# Patient Record
Sex: Female | Born: 1960
Health system: Southern US, Community
[De-identification: ages and names within clinical notes are randomized; demographics above are authoritative.]

## PROBLEM LIST (undated history)

## (undated) DIAGNOSIS — E039 Hypothyroidism, unspecified: Secondary | ICD-10-CM

## (undated) DIAGNOSIS — I739 Peripheral vascular disease, unspecified: Secondary | ICD-10-CM

## (undated) DIAGNOSIS — M51369 Other intervertebral disc degeneration, lumbar region without mention of lumbar back pain or lower extremity pain: Secondary | ICD-10-CM

## (undated) DIAGNOSIS — D509 Iron deficiency anemia, unspecified: Secondary | ICD-10-CM

## (undated) DIAGNOSIS — R195 Other fecal abnormalities: Secondary | ICD-10-CM

## (undated) DIAGNOSIS — J449 Chronic obstructive pulmonary disease, unspecified: Secondary | ICD-10-CM

## (undated) DIAGNOSIS — F32A Depression, unspecified: Secondary | ICD-10-CM

## (undated) DIAGNOSIS — N3010 Interstitial cystitis (chronic) without hematuria: Secondary | ICD-10-CM

## (undated) DIAGNOSIS — F329 Major depressive disorder, single episode, unspecified: Secondary | ICD-10-CM

## (undated) DIAGNOSIS — M549 Dorsalgia, unspecified: Secondary | ICD-10-CM

## (undated) DIAGNOSIS — J45909 Unspecified asthma, uncomplicated: Secondary | ICD-10-CM

## (undated) DIAGNOSIS — I10 Essential (primary) hypertension: Secondary | ICD-10-CM

## (undated) DIAGNOSIS — M5136 Other intervertebral disc degeneration, lumbar region: Secondary | ICD-10-CM

## (undated) DIAGNOSIS — M4800 Spinal stenosis, site unspecified: Secondary | ICD-10-CM

## (undated) DIAGNOSIS — E119 Type 2 diabetes mellitus without complications: Secondary | ICD-10-CM

## (undated) DIAGNOSIS — M5030 Other cervical disc degeneration, unspecified cervical region: Secondary | ICD-10-CM

## (undated) HISTORY — DX: Other fecal abnormalities: R19.5

## (undated) HISTORY — DX: Hypothyroidism, unspecified: E03.9

## (undated) HISTORY — DX: Other intervertebral disc degeneration, lumbar region: M51.36

## (undated) HISTORY — DX: Peripheral vascular disease, unspecified: I73.9

## (undated) HISTORY — DX: Type 2 diabetes mellitus without complications: E11.9

## (undated) HISTORY — DX: Interstitial cystitis (chronic) without hematuria: N30.10

## (undated) HISTORY — DX: Other cervical disc degeneration, unspecified cervical region: M50.30

## (undated) HISTORY — DX: Other intervertebral disc degeneration, lumbar region without mention of lumbar back pain or lower extremity pain: M51.369

## (undated) HISTORY — DX: Iron deficiency anemia, unspecified: D50.9

## (undated) HISTORY — DX: Spinal stenosis, site unspecified: M48.00

---

## 1999-02-09 ENCOUNTER — Other Ambulatory Visit: Admission: RE | Admit: 1999-02-09 | Discharge: 1999-02-09 | Payer: Self-pay | Admitting: Obstetrics & Gynecology

## 1999-07-08 ENCOUNTER — Ambulatory Visit (HOSPITAL_COMMUNITY): Admission: RE | Admit: 1999-07-08 | Discharge: 1999-07-08 | Payer: Self-pay | Admitting: Cardiology

## 1999-12-20 ENCOUNTER — Encounter: Admission: RE | Admit: 1999-12-20 | Discharge: 2000-03-19 | Payer: Self-pay | Admitting: Orthopedic Surgery

## 2000-02-22 ENCOUNTER — Encounter: Admission: RE | Admit: 2000-02-22 | Discharge: 2000-02-22 | Payer: Self-pay | Admitting: Internal Medicine

## 2000-03-08 ENCOUNTER — Encounter: Admission: RE | Admit: 2000-03-08 | Discharge: 2000-03-08 | Payer: Self-pay | Admitting: Internal Medicine

## 2000-04-04 ENCOUNTER — Encounter: Admission: RE | Admit: 2000-04-04 | Discharge: 2000-04-04 | Payer: Self-pay | Admitting: Internal Medicine

## 2000-04-09 ENCOUNTER — Encounter: Admission: RE | Admit: 2000-04-09 | Discharge: 2000-07-08 | Payer: Self-pay | Admitting: Family Medicine

## 2000-04-20 ENCOUNTER — Encounter: Admission: RE | Admit: 2000-04-20 | Discharge: 2000-04-20 | Payer: Self-pay | Admitting: Internal Medicine

## 2000-05-08 ENCOUNTER — Ambulatory Visit (HOSPITAL_BASED_OUTPATIENT_CLINIC_OR_DEPARTMENT_OTHER): Admission: RE | Admit: 2000-05-08 | Discharge: 2000-05-08 | Payer: Self-pay | Admitting: Orthopedic Surgery

## 2000-05-08 HISTORY — PX: CARPAL TUNNEL RELEASE: SHX101

## 2000-05-16 ENCOUNTER — Encounter: Admission: RE | Admit: 2000-05-16 | Discharge: 2000-05-16 | Payer: Self-pay | Admitting: Internal Medicine

## 2000-07-04 ENCOUNTER — Encounter: Admission: RE | Admit: 2000-07-04 | Discharge: 2000-07-04 | Payer: Self-pay | Admitting: Internal Medicine

## 2000-09-10 ENCOUNTER — Encounter: Admission: RE | Admit: 2000-09-10 | Discharge: 2000-09-10 | Payer: Self-pay | Admitting: Internal Medicine

## 2000-09-10 ENCOUNTER — Encounter: Payer: Self-pay | Admitting: Internal Medicine

## 2000-09-10 ENCOUNTER — Ambulatory Visit (HOSPITAL_COMMUNITY): Admission: RE | Admit: 2000-09-10 | Discharge: 2000-09-10 | Payer: Self-pay | Admitting: Internal Medicine

## 2000-10-03 ENCOUNTER — Encounter: Admission: RE | Admit: 2000-10-03 | Discharge: 2000-10-03 | Payer: Self-pay | Admitting: Internal Medicine

## 2000-10-09 ENCOUNTER — Encounter: Admission: RE | Admit: 2000-10-09 | Discharge: 2000-10-16 | Payer: Self-pay | Admitting: Internal Medicine

## 2000-10-10 ENCOUNTER — Encounter: Admission: RE | Admit: 2000-10-10 | Discharge: 2000-10-10 | Payer: Self-pay | Admitting: Internal Medicine

## 2000-10-24 ENCOUNTER — Encounter: Admission: RE | Admit: 2000-10-24 | Discharge: 2000-10-24 | Payer: Self-pay | Admitting: Internal Medicine

## 2000-11-27 ENCOUNTER — Encounter: Admission: RE | Admit: 2000-11-27 | Discharge: 2000-11-27 | Payer: Self-pay

## 2000-11-27 ENCOUNTER — Ambulatory Visit (HOSPITAL_COMMUNITY): Admission: RE | Admit: 2000-11-27 | Discharge: 2000-11-27 | Payer: Self-pay

## 2000-12-19 ENCOUNTER — Ambulatory Visit (HOSPITAL_COMMUNITY): Admission: RE | Admit: 2000-12-19 | Discharge: 2000-12-19 | Payer: Self-pay | Admitting: Internal Medicine

## 2000-12-19 ENCOUNTER — Encounter: Admission: RE | Admit: 2000-12-19 | Discharge: 2000-12-19 | Payer: Self-pay | Admitting: Internal Medicine

## 2000-12-24 ENCOUNTER — Encounter: Admission: RE | Admit: 2000-12-24 | Discharge: 2000-12-24 | Payer: Self-pay | Admitting: Internal Medicine

## 2001-01-04 ENCOUNTER — Encounter: Admission: RE | Admit: 2001-01-04 | Discharge: 2001-01-04 | Payer: Self-pay

## 2001-02-14 ENCOUNTER — Encounter: Admission: RE | Admit: 2001-02-14 | Discharge: 2001-02-14 | Payer: Self-pay | Admitting: Internal Medicine

## 2001-03-16 ENCOUNTER — Encounter: Payer: Self-pay | Admitting: Emergency Medicine

## 2001-03-16 ENCOUNTER — Emergency Department (HOSPITAL_COMMUNITY): Admission: EM | Admit: 2001-03-16 | Discharge: 2001-03-16 | Payer: Self-pay | Admitting: Emergency Medicine

## 2001-03-18 ENCOUNTER — Encounter: Admission: RE | Admit: 2001-03-18 | Discharge: 2001-03-18 | Payer: Self-pay

## 2001-04-17 ENCOUNTER — Encounter: Admission: RE | Admit: 2001-04-17 | Discharge: 2001-04-17 | Payer: Self-pay | Admitting: Internal Medicine

## 2001-05-15 ENCOUNTER — Encounter: Admission: RE | Admit: 2001-05-15 | Discharge: 2001-05-15 | Payer: Self-pay | Admitting: Internal Medicine

## 2001-06-24 ENCOUNTER — Encounter: Payer: Self-pay | Admitting: Emergency Medicine

## 2001-06-24 ENCOUNTER — Emergency Department (HOSPITAL_COMMUNITY): Admission: EM | Admit: 2001-06-24 | Discharge: 2001-06-24 | Payer: Self-pay | Admitting: Emergency Medicine

## 2001-07-03 ENCOUNTER — Encounter: Admission: RE | Admit: 2001-07-03 | Discharge: 2001-07-03 | Payer: Self-pay | Admitting: Internal Medicine

## 2001-08-12 ENCOUNTER — Encounter: Admission: RE | Admit: 2001-08-12 | Discharge: 2001-08-12 | Payer: Self-pay | Admitting: Internal Medicine

## 2001-09-03 ENCOUNTER — Encounter: Payer: Self-pay | Admitting: Internal Medicine

## 2001-09-03 ENCOUNTER — Encounter: Admission: RE | Admit: 2001-09-03 | Discharge: 2001-09-03 | Payer: Self-pay | Admitting: Obstetrics & Gynecology

## 2001-09-03 LAB — CONVERTED CEMR LAB: Pap Smear: NORMAL

## 2001-09-26 ENCOUNTER — Ambulatory Visit (HOSPITAL_COMMUNITY): Admission: RE | Admit: 2001-09-26 | Discharge: 2001-09-26 | Payer: Self-pay | Admitting: Family Medicine

## 2001-10-04 ENCOUNTER — Encounter: Admission: RE | Admit: 2001-10-04 | Discharge: 2001-10-04 | Payer: Self-pay | Admitting: Internal Medicine

## 2001-10-23 ENCOUNTER — Encounter: Admission: RE | Admit: 2001-10-23 | Discharge: 2001-10-23 | Payer: Self-pay | Admitting: Internal Medicine

## 2001-11-04 ENCOUNTER — Ambulatory Visit (HOSPITAL_COMMUNITY): Admission: RE | Admit: 2001-11-04 | Discharge: 2001-11-04 | Payer: Self-pay | Admitting: Internal Medicine

## 2001-11-04 ENCOUNTER — Encounter: Payer: Self-pay | Admitting: Internal Medicine

## 2001-11-27 ENCOUNTER — Encounter: Admission: RE | Admit: 2001-11-27 | Discharge: 2001-11-27 | Payer: Self-pay | Admitting: Internal Medicine

## 2001-12-10 ENCOUNTER — Encounter: Admission: RE | Admit: 2001-12-10 | Discharge: 2001-12-10 | Payer: Self-pay | Admitting: Internal Medicine

## 2001-12-10 ENCOUNTER — Encounter: Payer: Self-pay | Admitting: Internal Medicine

## 2001-12-18 ENCOUNTER — Encounter: Admission: RE | Admit: 2001-12-18 | Discharge: 2002-03-17 | Payer: Self-pay | Admitting: Internal Medicine

## 2002-01-01 ENCOUNTER — Encounter: Admission: RE | Admit: 2002-01-01 | Discharge: 2002-01-01 | Payer: Self-pay | Admitting: Internal Medicine

## 2002-01-01 ENCOUNTER — Ambulatory Visit (HOSPITAL_COMMUNITY): Admission: RE | Admit: 2002-01-01 | Discharge: 2002-01-01 | Payer: Self-pay | Admitting: Internal Medicine

## 2002-03-05 ENCOUNTER — Encounter: Admission: RE | Admit: 2002-03-05 | Discharge: 2002-03-05 | Payer: Self-pay | Admitting: Internal Medicine

## 2002-03-25 ENCOUNTER — Encounter: Admission: RE | Admit: 2002-03-25 | Discharge: 2002-06-23 | Payer: Self-pay | Admitting: Internal Medicine

## 2002-04-16 ENCOUNTER — Encounter: Admission: RE | Admit: 2002-04-16 | Discharge: 2002-04-16 | Payer: Self-pay | Admitting: Internal Medicine

## 2002-06-25 ENCOUNTER — Encounter: Admission: RE | Admit: 2002-06-25 | Discharge: 2002-06-25 | Payer: Self-pay | Admitting: Internal Medicine

## 2002-08-05 ENCOUNTER — Encounter: Admission: RE | Admit: 2002-08-05 | Discharge: 2002-08-05 | Payer: Self-pay | Admitting: Internal Medicine

## 2002-10-22 ENCOUNTER — Ambulatory Visit (HOSPITAL_COMMUNITY): Admission: RE | Admit: 2002-10-22 | Discharge: 2002-10-22 | Payer: Self-pay | Admitting: Internal Medicine

## 2002-10-29 ENCOUNTER — Encounter: Admission: RE | Admit: 2002-10-29 | Discharge: 2002-10-29 | Payer: Self-pay | Admitting: Internal Medicine

## 2003-01-07 ENCOUNTER — Encounter: Admission: RE | Admit: 2003-01-07 | Discharge: 2003-01-07 | Payer: Self-pay | Admitting: Internal Medicine

## 2003-01-21 ENCOUNTER — Encounter: Admission: RE | Admit: 2003-01-21 | Discharge: 2003-01-21 | Payer: Self-pay | Admitting: Internal Medicine

## 2003-03-10 ENCOUNTER — Encounter: Admission: RE | Admit: 2003-03-10 | Discharge: 2003-03-10 | Payer: Self-pay | Admitting: Internal Medicine

## 2003-05-13 ENCOUNTER — Ambulatory Visit (HOSPITAL_COMMUNITY): Admission: RE | Admit: 2003-05-13 | Discharge: 2003-05-13 | Payer: Self-pay | Admitting: Internal Medicine

## 2003-05-13 ENCOUNTER — Encounter: Admission: RE | Admit: 2003-05-13 | Discharge: 2003-05-13 | Payer: Self-pay | Admitting: Internal Medicine

## 2003-05-13 ENCOUNTER — Encounter: Payer: Self-pay | Admitting: Internal Medicine

## 2003-06-10 ENCOUNTER — Encounter: Admission: RE | Admit: 2003-06-10 | Discharge: 2003-06-10 | Payer: Self-pay | Admitting: Internal Medicine

## 2003-07-02 ENCOUNTER — Encounter: Admission: RE | Admit: 2003-07-02 | Discharge: 2003-07-02 | Payer: Self-pay | Admitting: Internal Medicine

## 2003-09-17 ENCOUNTER — Encounter: Admission: RE | Admit: 2003-09-17 | Discharge: 2003-09-17 | Payer: Self-pay | Admitting: Internal Medicine

## 2003-11-30 ENCOUNTER — Encounter: Admission: RE | Admit: 2003-11-30 | Discharge: 2003-11-30 | Payer: Self-pay | Admitting: Internal Medicine

## 2003-12-10 ENCOUNTER — Encounter: Admission: RE | Admit: 2003-12-10 | Discharge: 2003-12-10 | Payer: Self-pay | Admitting: Internal Medicine

## 2004-04-12 ENCOUNTER — Encounter: Payer: Self-pay | Admitting: Internal Medicine

## 2004-04-12 ENCOUNTER — Encounter: Admission: RE | Admit: 2004-04-12 | Discharge: 2004-04-12 | Payer: Self-pay | Admitting: Internal Medicine

## 2004-04-12 LAB — CONVERTED CEMR LAB: Hgb A1c MFr Bld: 7 %

## 2004-05-19 ENCOUNTER — Encounter: Payer: Self-pay | Admitting: Internal Medicine

## 2004-05-19 ENCOUNTER — Encounter: Admission: RE | Admit: 2004-05-19 | Discharge: 2004-05-19 | Payer: Self-pay | Admitting: Internal Medicine

## 2004-07-12 ENCOUNTER — Encounter: Admission: RE | Admit: 2004-07-12 | Discharge: 2004-07-12 | Payer: Self-pay | Admitting: Internal Medicine

## 2004-08-17 ENCOUNTER — Ambulatory Visit: Payer: Self-pay | Admitting: Internal Medicine

## 2004-08-25 ENCOUNTER — Ambulatory Visit: Payer: Self-pay | Admitting: Internal Medicine

## 2004-10-12 ENCOUNTER — Ambulatory Visit (HOSPITAL_COMMUNITY): Admission: RE | Admit: 2004-10-12 | Discharge: 2004-10-12 | Payer: Self-pay | Admitting: Internal Medicine

## 2004-10-12 ENCOUNTER — Ambulatory Visit: Payer: Self-pay | Admitting: Internal Medicine

## 2004-11-09 ENCOUNTER — Emergency Department (HOSPITAL_COMMUNITY): Admission: EM | Admit: 2004-11-09 | Discharge: 2004-11-09 | Payer: Self-pay | Admitting: Emergency Medicine

## 2004-11-17 ENCOUNTER — Emergency Department (HOSPITAL_COMMUNITY): Admission: EM | Admit: 2004-11-17 | Discharge: 2004-11-17 | Payer: Self-pay | Admitting: Emergency Medicine

## 2004-11-18 ENCOUNTER — Ambulatory Visit: Payer: Self-pay | Admitting: Internal Medicine

## 2004-11-29 ENCOUNTER — Ambulatory Visit: Payer: Self-pay | Admitting: Internal Medicine

## 2004-12-01 ENCOUNTER — Encounter: Admission: RE | Admit: 2004-12-01 | Discharge: 2004-12-01 | Payer: Self-pay | Admitting: Internal Medicine

## 2004-12-04 ENCOUNTER — Encounter: Admission: RE | Admit: 2004-12-04 | Discharge: 2004-12-04 | Payer: Self-pay | Admitting: Internal Medicine

## 2005-01-25 ENCOUNTER — Ambulatory Visit: Payer: Self-pay | Admitting: Internal Medicine

## 2005-02-13 ENCOUNTER — Emergency Department (HOSPITAL_COMMUNITY): Admission: EM | Admit: 2005-02-13 | Discharge: 2005-02-13 | Payer: Self-pay | Admitting: Emergency Medicine

## 2005-02-20 ENCOUNTER — Emergency Department (HOSPITAL_COMMUNITY): Admission: EM | Admit: 2005-02-20 | Discharge: 2005-02-20 | Payer: Self-pay | Admitting: Family Medicine

## 2005-02-22 ENCOUNTER — Ambulatory Visit: Payer: Self-pay | Admitting: Internal Medicine

## 2005-02-27 ENCOUNTER — Encounter: Admission: RE | Admit: 2005-02-27 | Discharge: 2005-05-28 | Payer: Self-pay | Admitting: Anesthesiology

## 2005-02-28 ENCOUNTER — Ambulatory Visit: Payer: Self-pay | Admitting: Anesthesiology

## 2005-03-21 ENCOUNTER — Encounter: Admission: RE | Admit: 2005-03-21 | Discharge: 2005-05-01 | Payer: Self-pay | Admitting: Anesthesiology

## 2005-03-29 ENCOUNTER — Ambulatory Visit: Payer: Self-pay | Admitting: Internal Medicine

## 2005-04-21 ENCOUNTER — Ambulatory Visit: Payer: Self-pay | Admitting: Internal Medicine

## 2005-05-16 ENCOUNTER — Ambulatory Visit: Payer: Self-pay | Admitting: Internal Medicine

## 2005-05-31 ENCOUNTER — Ambulatory Visit: Payer: Self-pay | Admitting: Internal Medicine

## 2005-06-10 ENCOUNTER — Emergency Department (HOSPITAL_COMMUNITY): Admission: EM | Admit: 2005-06-10 | Discharge: 2005-06-10 | Payer: Self-pay | Admitting: Family Medicine

## 2005-06-16 ENCOUNTER — Encounter: Admission: RE | Admit: 2005-06-16 | Discharge: 2005-06-16 | Payer: Self-pay | Admitting: Gastroenterology

## 2005-07-04 ENCOUNTER — Ambulatory Visit: Payer: Self-pay | Admitting: Internal Medicine

## 2005-07-06 ENCOUNTER — Encounter
Admission: RE | Admit: 2005-07-06 | Discharge: 2005-10-04 | Payer: Self-pay | Admitting: Physical Medicine and Rehabilitation

## 2005-07-18 ENCOUNTER — Ambulatory Visit: Payer: Self-pay | Admitting: Anesthesiology

## 2005-07-20 ENCOUNTER — Ambulatory Visit (HOSPITAL_COMMUNITY): Admission: RE | Admit: 2005-07-20 | Discharge: 2005-07-20 | Payer: Self-pay | Admitting: Obstetrics

## 2005-08-28 ENCOUNTER — Emergency Department (HOSPITAL_COMMUNITY): Admission: EM | Admit: 2005-08-28 | Discharge: 2005-08-28 | Payer: Self-pay | Admitting: Family Medicine

## 2005-08-30 ENCOUNTER — Emergency Department (HOSPITAL_COMMUNITY): Admission: EM | Admit: 2005-08-30 | Discharge: 2005-08-31 | Payer: Self-pay | Admitting: Emergency Medicine

## 2005-09-01 ENCOUNTER — Emergency Department (HOSPITAL_COMMUNITY): Admission: EM | Admit: 2005-09-01 | Discharge: 2005-09-01 | Payer: Self-pay | Admitting: Emergency Medicine

## 2005-09-05 ENCOUNTER — Ambulatory Visit: Payer: Self-pay | Admitting: Internal Medicine

## 2005-09-12 ENCOUNTER — Ambulatory Visit: Payer: Self-pay | Admitting: Anesthesiology

## 2005-09-20 ENCOUNTER — Ambulatory Visit: Payer: Self-pay | Admitting: Internal Medicine

## 2005-10-09 ENCOUNTER — Encounter: Admission: RE | Admit: 2005-10-09 | Discharge: 2006-01-07 | Payer: Self-pay | Admitting: Anesthesiology

## 2005-10-18 ENCOUNTER — Ambulatory Visit: Payer: Self-pay | Admitting: Internal Medicine

## 2005-11-07 ENCOUNTER — Emergency Department (HOSPITAL_COMMUNITY): Admission: EM | Admit: 2005-11-07 | Discharge: 2005-11-07 | Payer: Self-pay | Admitting: Emergency Medicine

## 2005-11-17 ENCOUNTER — Inpatient Hospital Stay (HOSPITAL_COMMUNITY): Admission: AD | Admit: 2005-11-17 | Discharge: 2005-11-18 | Payer: Self-pay | Admitting: Internal Medicine

## 2005-11-17 ENCOUNTER — Ambulatory Visit: Payer: Self-pay | Admitting: Internal Medicine

## 2005-11-18 ENCOUNTER — Ambulatory Visit: Payer: Self-pay | Admitting: Internal Medicine

## 2005-11-20 DIAGNOSIS — E119 Type 2 diabetes mellitus without complications: Secondary | ICD-10-CM

## 2005-11-20 HISTORY — DX: Type 2 diabetes mellitus without complications: E11.9

## 2005-12-28 ENCOUNTER — Ambulatory Visit: Payer: Self-pay | Admitting: Internal Medicine

## 2006-02-15 ENCOUNTER — Emergency Department (HOSPITAL_COMMUNITY): Admission: EM | Admit: 2006-02-15 | Discharge: 2006-02-15 | Payer: Self-pay | Admitting: Emergency Medicine

## 2006-03-23 ENCOUNTER — Ambulatory Visit (HOSPITAL_COMMUNITY): Admission: RE | Admit: 2006-03-23 | Discharge: 2006-03-23 | Payer: Self-pay | Admitting: Family Medicine

## 2006-03-23 ENCOUNTER — Emergency Department (HOSPITAL_COMMUNITY): Admission: EM | Admit: 2006-03-23 | Discharge: 2006-03-23 | Payer: Self-pay | Admitting: Family Medicine

## 2006-04-03 ENCOUNTER — Ambulatory Visit: Payer: Self-pay | Admitting: Internal Medicine

## 2006-05-30 ENCOUNTER — Encounter: Admission: RE | Admit: 2006-05-30 | Discharge: 2006-05-30 | Payer: Self-pay | Admitting: Internal Medicine

## 2006-06-06 ENCOUNTER — Ambulatory Visit: Payer: Self-pay | Admitting: Internal Medicine

## 2006-06-27 ENCOUNTER — Encounter: Payer: Self-pay | Admitting: Internal Medicine

## 2006-07-04 ENCOUNTER — Ambulatory Visit: Payer: Self-pay | Admitting: Internal Medicine

## 2006-07-06 DIAGNOSIS — J309 Allergic rhinitis, unspecified: Secondary | ICD-10-CM | POA: Insufficient documentation

## 2006-07-06 DIAGNOSIS — R0600 Dyspnea, unspecified: Secondary | ICD-10-CM

## 2006-07-06 DIAGNOSIS — M5137 Other intervertebral disc degeneration, lumbosacral region: Secondary | ICD-10-CM

## 2006-07-06 DIAGNOSIS — L719 Rosacea, unspecified: Secondary | ICD-10-CM

## 2006-07-06 DIAGNOSIS — E1165 Type 2 diabetes mellitus with hyperglycemia: Secondary | ICD-10-CM

## 2006-07-06 DIAGNOSIS — M5136 Other intervertebral disc degeneration, lumbar region: Secondary | ICD-10-CM | POA: Insufficient documentation

## 2006-07-06 DIAGNOSIS — K219 Gastro-esophageal reflux disease without esophagitis: Secondary | ICD-10-CM

## 2006-07-06 DIAGNOSIS — E669 Obesity, unspecified: Secondary | ICD-10-CM

## 2006-07-06 DIAGNOSIS — F411 Generalized anxiety disorder: Secondary | ICD-10-CM | POA: Insufficient documentation

## 2006-07-06 DIAGNOSIS — M5440 Lumbago with sciatica, unspecified side: Secondary | ICD-10-CM | POA: Insufficient documentation

## 2006-07-06 DIAGNOSIS — E039 Hypothyroidism, unspecified: Secondary | ICD-10-CM

## 2006-07-06 DIAGNOSIS — I10 Essential (primary) hypertension: Secondary | ICD-10-CM | POA: Insufficient documentation

## 2006-07-06 DIAGNOSIS — E785 Hyperlipidemia, unspecified: Secondary | ICD-10-CM | POA: Insufficient documentation

## 2006-07-31 ENCOUNTER — Ambulatory Visit (HOSPITAL_COMMUNITY): Admission: RE | Admit: 2006-07-31 | Discharge: 2006-07-31 | Payer: Self-pay | Admitting: Internal Medicine

## 2006-07-31 ENCOUNTER — Ambulatory Visit: Payer: Self-pay | Admitting: Internal Medicine

## 2006-08-18 ENCOUNTER — Emergency Department (HOSPITAL_COMMUNITY): Admission: EM | Admit: 2006-08-18 | Discharge: 2006-08-18 | Payer: Self-pay | Admitting: Family Medicine

## 2006-08-22 ENCOUNTER — Ambulatory Visit: Payer: Self-pay | Admitting: Internal Medicine

## 2006-09-13 ENCOUNTER — Ambulatory Visit: Payer: Self-pay | Admitting: Internal Medicine

## 2006-09-13 LAB — CONVERTED CEMR LAB
AST: 36 units/L (ref 0–37)
Albumin: 4.4 g/dL (ref 3.5–5.2)
Creatinine, Ser: 0.64 mg/dL (ref 0.40–1.20)
Glucose, Bld: 150 mg/dL — ABNORMAL HIGH (ref 70–99)
Total Protein: 7.3 g/dL (ref 6.0–8.3)

## 2006-11-23 ENCOUNTER — Emergency Department (HOSPITAL_COMMUNITY): Admission: EM | Admit: 2006-11-23 | Discharge: 2006-11-23 | Payer: Self-pay | Admitting: Family Medicine

## 2006-12-07 ENCOUNTER — Telehealth: Payer: Self-pay | Admitting: *Deleted

## 2007-01-07 ENCOUNTER — Telehealth (INDEPENDENT_AMBULATORY_CARE_PROVIDER_SITE_OTHER): Payer: Self-pay | Admitting: Hospitalist

## 2007-01-07 ENCOUNTER — Telehealth (INDEPENDENT_AMBULATORY_CARE_PROVIDER_SITE_OTHER): Payer: Self-pay | Admitting: *Deleted

## 2007-01-16 ENCOUNTER — Ambulatory Visit: Payer: Self-pay | Admitting: Hospitalist

## 2007-01-16 ENCOUNTER — Encounter (INDEPENDENT_AMBULATORY_CARE_PROVIDER_SITE_OTHER): Payer: Self-pay | Admitting: *Deleted

## 2007-01-16 LAB — CONVERTED CEMR LAB: Hgb A1c MFr Bld: 6.1 %

## 2007-01-23 ENCOUNTER — Telehealth: Payer: Self-pay | Admitting: *Deleted

## 2007-01-29 ENCOUNTER — Encounter (INDEPENDENT_AMBULATORY_CARE_PROVIDER_SITE_OTHER): Payer: Self-pay | Admitting: *Deleted

## 2007-01-29 ENCOUNTER — Encounter: Admission: RE | Admit: 2007-01-29 | Discharge: 2007-02-20 | Payer: Self-pay | Admitting: *Deleted

## 2007-01-30 ENCOUNTER — Ambulatory Visit: Payer: Self-pay | Admitting: Internal Medicine

## 2007-01-30 LAB — CONVERTED CEMR LAB: Blood Glucose, Fingerstick: 134

## 2007-02-01 ENCOUNTER — Ambulatory Visit: Payer: Self-pay | Admitting: Internal Medicine

## 2007-02-01 ENCOUNTER — Encounter: Payer: Self-pay | Admitting: Internal Medicine

## 2007-02-01 DIAGNOSIS — R740 Nonspecific elevation of levels of transaminase and lactic acid dehydrogenase [LDH]: Secondary | ICD-10-CM

## 2007-02-01 DIAGNOSIS — R7401 Elevation of levels of liver transaminase levels: Secondary | ICD-10-CM | POA: Insufficient documentation

## 2007-02-03 ENCOUNTER — Encounter
Admission: RE | Admit: 2007-02-03 | Discharge: 2007-02-03 | Payer: Self-pay | Admitting: Physical Medicine and Rehabilitation

## 2007-02-07 LAB — CONVERTED CEMR LAB
ALT: 51 units/L — ABNORMAL HIGH (ref 0–35)
AST: 48 units/L — ABNORMAL HIGH (ref 0–37)
Alkaline Phosphatase: 80 units/L (ref 39–117)
BUN: 10 mg/dL (ref 6–23)
Basophils Absolute: 0 10*3/uL (ref 0.0–0.1)
CO2: 26 meq/L (ref 19–32)
Calcium: 9.4 mg/dL (ref 8.4–10.5)
Creatinine, Ser: 0.7 mg/dL (ref 0.40–1.20)
Eosinophils Absolute: 0.4 10*3/uL (ref 0.0–0.7)
Glucose, Bld: 126 mg/dL — ABNORMAL HIGH (ref 70–99)
HDL: 32 mg/dL — ABNORMAL LOW (ref >39)
LDL Cholesterol: 70 mg/dL (ref 0–99)
Lymphocytes Relative: 41 % (ref 12–46)
Lymphs Abs: 2.7 10*3/uL (ref 0.7–3.3)
MCHC: 32.3 g/dL (ref 30.0–36.0)
Neutro Abs: 3.1 10*3/uL (ref 1.7–7.7)
Neutrophils Relative %: 47 % (ref 43–77)
Platelets: 239 10*3/uL (ref 150–400)
Potassium: 4 meq/L (ref 3.5–5.3)
Sodium: 138 meq/L (ref 135–145)
Total Bilirubin: 0.5 mg/dL (ref 0.3–1.2)
Total Protein: 7.2 g/dL (ref 6.0–8.3)
WBC: 6.6 10*3/uL (ref 4.0–10.5)

## 2007-02-14 ENCOUNTER — Telehealth: Payer: Self-pay | Admitting: *Deleted

## 2007-03-07 ENCOUNTER — Telehealth: Payer: Self-pay | Admitting: *Deleted

## 2007-04-01 ENCOUNTER — Telehealth: Payer: Self-pay | Admitting: *Deleted

## 2007-04-02 ENCOUNTER — Ambulatory Visit: Payer: Self-pay | Admitting: Internal Medicine

## 2007-04-02 ENCOUNTER — Telehealth (INDEPENDENT_AMBULATORY_CARE_PROVIDER_SITE_OTHER): Payer: Self-pay | Admitting: Unknown Physician Specialty

## 2007-04-02 ENCOUNTER — Encounter (INDEPENDENT_AMBULATORY_CARE_PROVIDER_SITE_OTHER): Payer: Self-pay | Admitting: Unknown Physician Specialty

## 2007-04-02 LAB — CONVERTED CEMR LAB
BUN: 14 mg/dL (ref 6–23)
Creatinine, Ser: 0.6 mg/dL (ref 0.40–1.20)
Glucose, Bld: 154 mg/dL — ABNORMAL HIGH (ref 70–99)
Hgb A1c MFr Bld: 6.7 %

## 2007-04-05 ENCOUNTER — Encounter: Payer: Self-pay | Admitting: Internal Medicine

## 2007-04-16 ENCOUNTER — Encounter (INDEPENDENT_AMBULATORY_CARE_PROVIDER_SITE_OTHER): Payer: Self-pay | Admitting: Unknown Physician Specialty

## 2007-04-16 ENCOUNTER — Ambulatory Visit: Payer: Self-pay | Admitting: Internal Medicine

## 2007-04-16 LAB — CONVERTED CEMR LAB: Blood Glucose, Fingerstick: 173

## 2007-04-29 ENCOUNTER — Encounter: Payer: Self-pay | Admitting: Internal Medicine

## 2007-05-01 ENCOUNTER — Ambulatory Visit: Payer: Self-pay | Admitting: Internal Medicine

## 2007-05-01 LAB — CONVERTED CEMR LAB
ALT: 43 units/L — ABNORMAL HIGH (ref 0–35)
AST: 44 units/L — ABNORMAL HIGH (ref 0–37)
Basophils Absolute: 0 10*3/uL (ref 0.0–0.1)
Basophils Relative: 1 % (ref 0–1)
Blood Glucose, Fingerstick: 136
CO2: 29 meq/L (ref 19–32)
Chloride: 100 meq/L (ref 96–112)
Creatinine, Ser: 0.59 mg/dL (ref 0.40–1.20)
Eosinophils Relative: 3 % (ref 0–5)
Lymphocytes Relative: 37 % (ref 12–46)
MCHC: 31.7 g/dL (ref 30.0–36.0)
Neutro Abs: 3.4 10*3/uL (ref 1.7–7.7)
Platelets: 226 10*3/uL (ref 150–400)
RDW: 14.7 % — ABNORMAL HIGH (ref 11.5–14.0)
Sodium: 139 meq/L (ref 135–145)
Total Bilirubin: 0.5 mg/dL (ref 0.3–1.2)
Total Protein: 7 g/dL (ref 6.0–8.3)

## 2007-05-09 ENCOUNTER — Emergency Department (HOSPITAL_COMMUNITY): Admission: EM | Admit: 2007-05-09 | Discharge: 2007-05-09 | Payer: Self-pay | Admitting: Family Medicine

## 2007-05-11 ENCOUNTER — Emergency Department (HOSPITAL_COMMUNITY): Admission: EM | Admit: 2007-05-11 | Discharge: 2007-05-11 | Payer: Self-pay | Admitting: Emergency Medicine

## 2007-05-13 ENCOUNTER — Telehealth: Payer: Self-pay | Admitting: *Deleted

## 2007-05-16 ENCOUNTER — Emergency Department (HOSPITAL_COMMUNITY): Admission: EM | Admit: 2007-05-16 | Discharge: 2007-05-16 | Payer: Self-pay | Admitting: Emergency Medicine

## 2007-05-27 ENCOUNTER — Telehealth (INDEPENDENT_AMBULATORY_CARE_PROVIDER_SITE_OTHER): Payer: Self-pay | Admitting: *Deleted

## 2007-06-20 ENCOUNTER — Telehealth (INDEPENDENT_AMBULATORY_CARE_PROVIDER_SITE_OTHER): Payer: Self-pay | Admitting: *Deleted

## 2007-08-21 ENCOUNTER — Encounter (INDEPENDENT_AMBULATORY_CARE_PROVIDER_SITE_OTHER): Payer: Self-pay | Admitting: Infectious Diseases

## 2007-08-21 ENCOUNTER — Ambulatory Visit: Payer: Self-pay | Admitting: Hospitalist

## 2007-08-21 LAB — CONVERTED CEMR LAB: Hgb A1c MFr Bld: 6.4 %

## 2007-08-23 LAB — CONVERTED CEMR LAB
ALT: 25 units/L (ref 0–35)
BUN: 9 mg/dL (ref 6–23)
CO2: 27 meq/L (ref 19–32)
Calcium: 9.4 mg/dL (ref 8.4–10.5)
Chloride: 100 meq/L (ref 96–112)
Creatinine, Ser: 0.6 mg/dL (ref 0.40–1.20)
Free T4: 1.04 ng/dL (ref 0.89–1.80)
TSH: 1.346 microintl units/mL (ref 0.350–5.50)
Total Bilirubin: 0.4 mg/dL (ref 0.3–1.2)

## 2007-10-10 ENCOUNTER — Ambulatory Visit: Payer: Self-pay | Admitting: Internal Medicine

## 2007-10-10 LAB — CONVERTED CEMR LAB: Blood Glucose, Fingerstick: 134

## 2007-10-29 ENCOUNTER — Emergency Department (HOSPITAL_COMMUNITY): Admission: EM | Admit: 2007-10-29 | Discharge: 2007-10-29 | Payer: Self-pay | Admitting: Emergency Medicine

## 2007-11-15 ENCOUNTER — Emergency Department (HOSPITAL_COMMUNITY): Admission: EM | Admit: 2007-11-15 | Discharge: 2007-11-16 | Payer: Self-pay | Admitting: Emergency Medicine

## 2007-11-29 ENCOUNTER — Emergency Department (HOSPITAL_COMMUNITY): Admission: EM | Admit: 2007-11-29 | Discharge: 2007-11-29 | Payer: Self-pay | Admitting: Emergency Medicine

## 2007-12-02 ENCOUNTER — Ambulatory Visit: Payer: Self-pay | Admitting: Internal Medicine

## 2007-12-02 ENCOUNTER — Telehealth: Payer: Self-pay | Admitting: *Deleted

## 2007-12-08 ENCOUNTER — Encounter: Admission: RE | Admit: 2007-12-08 | Discharge: 2007-12-08 | Payer: Self-pay | Admitting: Infectious Diseases

## 2007-12-16 ENCOUNTER — Ambulatory Visit: Payer: Self-pay | Admitting: Internal Medicine

## 2007-12-16 LAB — CONVERTED CEMR LAB
Blood Glucose, Fingerstick: 139
Hgb A1c MFr Bld: 6.3 %

## 2007-12-17 DIAGNOSIS — M5030 Other cervical disc degeneration, unspecified cervical region: Secondary | ICD-10-CM

## 2008-02-04 ENCOUNTER — Encounter: Payer: Self-pay | Admitting: Internal Medicine

## 2008-02-27 ENCOUNTER — Encounter: Payer: Self-pay | Admitting: Internal Medicine

## 2008-03-01 ENCOUNTER — Emergency Department (HOSPITAL_COMMUNITY): Admission: EM | Admit: 2008-03-01 | Discharge: 2008-03-01 | Payer: Self-pay | Admitting: Emergency Medicine

## 2008-04-21 ENCOUNTER — Encounter: Payer: Self-pay | Admitting: Internal Medicine

## 2008-05-11 ENCOUNTER — Encounter (INDEPENDENT_AMBULATORY_CARE_PROVIDER_SITE_OTHER): Payer: Self-pay | Admitting: *Deleted

## 2008-05-11 ENCOUNTER — Ambulatory Visit: Payer: Self-pay | Admitting: Internal Medicine

## 2008-05-12 LAB — CONVERTED CEMR LAB
ALT: 18 units/L (ref 0–35)
BUN: 9 mg/dL (ref 6–23)
Bilirubin, Direct: 0.1 mg/dL (ref 0.0–0.3)
CO2: 28 meq/L (ref 19–32)
Chloride: 98 meq/L (ref 96–112)
Cholesterol: 174 mg/dL (ref 0–200)
Glucose, Bld: 170 mg/dL — ABNORMAL HIGH (ref 70–99)
Indirect Bilirubin: 0.2 mg/dL (ref 0.0–0.9)
LDL Cholesterol: 80 mg/dL (ref 0–99)
Microalb, Ur: 0.3 mg/dL (ref 0.00–1.89)
Potassium: 4.4 meq/L (ref 3.5–5.3)
Sodium: 139 meq/L (ref 135–145)
Total CHOL/HDL Ratio: 4.7
VLDL: 57 mg/dL — ABNORMAL HIGH (ref 0–40)

## 2008-06-02 ENCOUNTER — Emergency Department (HOSPITAL_COMMUNITY): Admission: EM | Admit: 2008-06-02 | Discharge: 2008-06-02 | Payer: Self-pay | Admitting: Emergency Medicine

## 2008-06-11 ENCOUNTER — Telehealth: Payer: Self-pay | Admitting: Internal Medicine

## 2008-08-05 ENCOUNTER — Ambulatory Visit: Payer: Self-pay | Admitting: Internal Medicine

## 2008-08-05 LAB — CONVERTED CEMR LAB
ALT: 26 units/L (ref 0–35)
Basophils Absolute: 0.1 10*3/uL (ref 0.0–0.1)
Blood Glucose, Fingerstick: 187
CO2: 24 meq/L (ref 19–32)
Calcium: 9.7 mg/dL (ref 8.4–10.5)
Chloride: 98 meq/L (ref 96–112)
Glucose, Bld: 146 mg/dL — ABNORMAL HIGH (ref 70–99)
HCT: 40 % (ref 36.0–46.0)
Hemoglobin, Urine: NEGATIVE
Hemoglobin: 12.3 g/dL (ref 12.0–15.0)
Hgb A1c MFr Bld: 6.2 %
Lymphocytes Relative: 29 % (ref 12–46)
Lymphs Abs: 2.4 10*3/uL (ref 0.7–4.0)
Monocytes Absolute: 0.5 10*3/uL (ref 0.1–1.0)
Neutro Abs: 5 10*3/uL (ref 1.7–7.7)
Nitrite: NEGATIVE
Platelets: 221 10*3/uL (ref 150–400)
Protein, ur: NEGATIVE mg/dL
RDW: 14.3 % (ref 11.5–15.5)
Sodium: 139 meq/L (ref 135–145)
Total Protein: 7.6 g/dL (ref 6.0–8.3)
Urobilinogen, UA: 0.2 (ref 0.0–1.0)
WBC: 8.4 10*3/uL (ref 4.0–10.5)

## 2008-08-18 ENCOUNTER — Encounter: Payer: Self-pay | Admitting: Internal Medicine

## 2008-08-28 ENCOUNTER — Encounter: Payer: Self-pay | Admitting: Internal Medicine

## 2008-08-31 ENCOUNTER — Encounter: Payer: Self-pay | Admitting: Internal Medicine

## 2008-09-02 ENCOUNTER — Encounter: Payer: Self-pay | Admitting: Internal Medicine

## 2008-10-02 ENCOUNTER — Telehealth (INDEPENDENT_AMBULATORY_CARE_PROVIDER_SITE_OTHER): Payer: Self-pay | Admitting: *Deleted

## 2008-10-26 ENCOUNTER — Encounter (INDEPENDENT_AMBULATORY_CARE_PROVIDER_SITE_OTHER): Payer: Self-pay | Admitting: *Deleted

## 2008-10-26 ENCOUNTER — Telehealth (INDEPENDENT_AMBULATORY_CARE_PROVIDER_SITE_OTHER): Payer: Self-pay | Admitting: *Deleted

## 2008-11-25 ENCOUNTER — Encounter: Payer: Self-pay | Admitting: Internal Medicine

## 2008-12-11 ENCOUNTER — Inpatient Hospital Stay (HOSPITAL_COMMUNITY): Admission: EM | Admit: 2008-12-11 | Discharge: 2008-12-12 | Payer: Self-pay | Admitting: Infectious Diseases

## 2008-12-11 ENCOUNTER — Encounter (INDEPENDENT_AMBULATORY_CARE_PROVIDER_SITE_OTHER): Payer: Self-pay | Admitting: Internal Medicine

## 2008-12-11 ENCOUNTER — Encounter: Payer: Self-pay | Admitting: Internal Medicine

## 2008-12-11 ENCOUNTER — Ambulatory Visit: Payer: Self-pay | Admitting: Internal Medicine

## 2008-12-11 LAB — CONVERTED CEMR LAB
ALT: 36 units/L — ABNORMAL HIGH (ref 0–35)
AST: 31 units/L (ref 0–37)
Albumin: 4 g/dL (ref 3.5–5.2)
Alkaline Phosphatase: 87 units/L (ref 39–117)
BUN: 8 mg/dL (ref 6–23)
Blood Glucose, Fingerstick: 138
CO2: 31 meq/L (ref 19–32)
Calcium: 9.4 mg/dL (ref 8.4–10.5)
Chloride: 96 meq/L (ref 96–112)
Creatinine, Ser: 0.58 mg/dL (ref 0.40–1.20)
Glucose, Bld: 147 mg/dL — ABNORMAL HIGH (ref 70–99)
HCT: 37 % (ref 36.0–46.0)
Hemoglobin: 12.3 g/dL (ref 12.0–15.0)
Hgb A1c MFr Bld: 6.8 %
MCHC: 33.2 g/dL (ref 30.0–36.0)
MCV: 86.3 fL (ref 78.0–100.0)
Platelets: 208 10*3/uL (ref 150–400)
Potassium: 3.9 meq/L (ref 3.5–5.3)
RBC: 4.29 M/uL (ref 3.87–5.11)
RDW: 13.6 % (ref 11.5–15.5)
Sodium: 136 meq/L (ref 135–145)
TSH: 1.509 microintl units/mL (ref 0.350–4.50)
Total Bilirubin: 0.5 mg/dL (ref 0.3–1.2)
Total Protein: 7.1 g/dL (ref 6.0–8.3)
WBC: 7.1 10*3/uL (ref 4.0–10.5)

## 2008-12-12 ENCOUNTER — Encounter (INDEPENDENT_AMBULATORY_CARE_PROVIDER_SITE_OTHER): Payer: Self-pay | Admitting: Internal Medicine

## 2008-12-14 ENCOUNTER — Encounter: Payer: Self-pay | Admitting: Internal Medicine

## 2009-01-05 DIAGNOSIS — N3010 Interstitial cystitis (chronic) without hematuria: Secondary | ICD-10-CM

## 2009-01-05 DIAGNOSIS — N952 Postmenopausal atrophic vaginitis: Secondary | ICD-10-CM

## 2009-01-12 ENCOUNTER — Encounter: Payer: Self-pay | Admitting: Internal Medicine

## 2009-04-21 ENCOUNTER — Ambulatory Visit: Payer: Self-pay | Admitting: Internal Medicine

## 2009-04-21 DIAGNOSIS — B079 Viral wart, unspecified: Secondary | ICD-10-CM | POA: Insufficient documentation

## 2009-04-21 DIAGNOSIS — N926 Irregular menstruation, unspecified: Secondary | ICD-10-CM | POA: Insufficient documentation

## 2009-04-21 LAB — CONVERTED CEMR LAB
Alkaline Phosphatase: 75 units/L (ref 39–117)
BUN: 14 mg/dL (ref 6–23)
GFR calc non Af Amer: >60 mL/min (ref >60)
Glucose, Bld: 162 mg/dL — ABNORMAL HIGH (ref 70–99)
HDL: 39 mg/dL — ABNORMAL LOW (ref >39)
Lymphocytes Relative: 25 % (ref 12–46)
Lymphs Abs: 1.9 10*3/uL (ref 0.7–4.0)
Microalb Creat Ratio: 9.2 mg/g (ref 0.0–30.0)
Microalb, Ur: 0.64 mg/dL (ref 0.00–1.89)
Neutro Abs: 4.8 10*3/uL (ref 1.7–7.7)
Neutrophils Relative %: 63 % (ref 43–77)
Platelets: 246 10*3/uL (ref 150–400)
Sodium: 137 meq/L (ref 135–145)
Total Bilirubin: 0.3 mg/dL (ref 0.3–1.2)
Total Protein: 7.2 g/dL (ref 6.0–8.3)
Triglycerides: 425 mg/dL — ABNORMAL HIGH (ref <150)
WBC: 7.6 10*3/uL (ref 4.0–10.5)

## 2009-05-04 ENCOUNTER — Ambulatory Visit (HOSPITAL_COMMUNITY): Admission: RE | Admit: 2009-05-04 | Discharge: 2009-05-04 | Payer: Self-pay | Admitting: Obstetrics

## 2009-05-04 ENCOUNTER — Encounter: Payer: Self-pay | Admitting: Internal Medicine

## 2009-05-13 ENCOUNTER — Encounter: Payer: Self-pay | Admitting: Internal Medicine

## 2009-06-04 ENCOUNTER — Encounter: Payer: Self-pay | Admitting: Internal Medicine

## 2009-07-07 ENCOUNTER — Ambulatory Visit: Payer: Self-pay | Admitting: Internal Medicine

## 2009-07-07 ENCOUNTER — Ambulatory Visit (HOSPITAL_COMMUNITY): Admission: RE | Admit: 2009-07-07 | Discharge: 2009-07-07 | Payer: Self-pay | Admitting: Internal Medicine

## 2009-07-07 LAB — CONVERTED CEMR LAB
Blood Glucose, Fingerstick: 168
Direct LDL: 101 mg/dL — ABNORMAL HIGH
HDL: 39 mg/dL — ABNORMAL LOW (ref >39)
Hgb A1c MFr Bld: 6.6 %
Triglycerides: 239 mg/dL — ABNORMAL HIGH (ref <150)

## 2009-09-17 ENCOUNTER — Encounter: Payer: Self-pay | Admitting: Internal Medicine

## 2009-11-15 ENCOUNTER — Emergency Department (HOSPITAL_COMMUNITY): Admission: EM | Admit: 2009-11-15 | Discharge: 2009-11-15 | Payer: Self-pay | Admitting: Emergency Medicine

## 2009-11-15 DIAGNOSIS — M25579 Pain in unspecified ankle and joints of unspecified foot: Secondary | ICD-10-CM

## 2009-11-24 ENCOUNTER — Ambulatory Visit: Payer: Self-pay | Admitting: Internal Medicine

## 2009-11-24 LAB — CONVERTED CEMR LAB
ALT: 14 units/L (ref 0–35)
Albumin: 3.9 g/dL (ref 3.5–5.2)
Blood Glucose, Fingerstick: 128
CO2: 23 meq/L (ref 19–32)
Calcium: 8.7 mg/dL (ref 8.4–10.5)
Chloride: 99 meq/L (ref 96–112)
Cholesterol: 146 mg/dL (ref 0–200)
Creatinine, Ser: 0.48 mg/dL (ref 0.40–1.20)
Eosinophils Absolute: 0.5 10*3/uL (ref 0.0–0.7)
HCT: 31.3 % — ABNORMAL LOW (ref 36.0–46.0)
Lymphocytes Relative: 29 % (ref 12–46)
Lymphs Abs: 2.1 10*3/uL (ref 0.7–4.0)
MCV: 88.9 fL (ref >=78.0)
Monocytes Relative: 4 % (ref 3–12)
Neutrophils Relative %: 60 % (ref 43–77)
Potassium: 4.2 meq/L (ref 3.5–5.3)
RBC: 3.52 M/uL — ABNORMAL LOW (ref 3.87–5.11)
TSH: 4.035 microintl units/mL (ref 0.350–4.5)
Total Protein: 6 g/dL (ref 6.0–8.3)
WBC: 7.3 10*3/uL (ref 4.0–10.5)

## 2009-11-26 ENCOUNTER — Encounter: Payer: Self-pay | Admitting: Internal Medicine

## 2009-11-26 ENCOUNTER — Ambulatory Visit (HOSPITAL_COMMUNITY): Admission: RE | Admit: 2009-11-26 | Discharge: 2009-11-26 | Payer: Self-pay | Admitting: Internal Medicine

## 2009-11-29 ENCOUNTER — Encounter: Payer: Self-pay | Admitting: Internal Medicine

## 2009-12-09 ENCOUNTER — Ambulatory Visit: Payer: Self-pay | Admitting: Sports Medicine

## 2009-12-10 ENCOUNTER — Telehealth: Payer: Self-pay | Admitting: *Deleted

## 2009-12-10 ENCOUNTER — Encounter: Payer: Self-pay | Admitting: Internal Medicine

## 2009-12-10 ENCOUNTER — Telehealth: Payer: Self-pay | Admitting: Internal Medicine

## 2009-12-15 ENCOUNTER — Ambulatory Visit: Payer: Self-pay | Admitting: Internal Medicine

## 2009-12-15 LAB — CONVERTED CEMR LAB
Basophils Absolute: 0 10*3/uL (ref 0.0–0.1)
Eosinophils Absolute: 0.4 10*3/uL (ref 0.0–0.7)
Eosinophils Absolute: 0.4 10*3/uL (ref 0.0–0.7)
Eosinophils Relative: 7 % — ABNORMAL HIGH (ref 0–5)
Eosinophils Relative: 6 % — ABNORMAL HIGH (ref 0–5)
HCT: 33.8 % — ABNORMAL LOW (ref 36.0–46.0)
HCT: 36.3 % (ref 36.0–46.0)
Hemoglobin: 11.8 g/dL — ABNORMAL LOW (ref 12.0–15.0)
Lymphocytes Relative: 29 % (ref 12–46)
Lymphocytes Relative: 32 % (ref 12–46)
Lymphs Abs: 1.7 10*3/uL (ref 0.7–4.0)
Lymphs Abs: 1.9 10*3/uL (ref 0.7–4.0)
MCV: 86.5 fL (ref >=78.0)
MCV: 87.5 fL (ref >=78.0)
Monocytes Relative: 7 % (ref 3–12)
Neutrophils Relative %: 55 % (ref 43–77)
Platelets: 206 10*3/uL (ref 150–400)
RBC: 3.9 M/uL (ref 3.87–5.11)
RDW: 13.9 % (ref 11.5–15.5)
WBC: 5.9 10*3/uL (ref 4.0–10.5)
WBC: 5.8 10*3/uL (ref 4.0–10.5)

## 2009-12-17 LAB — CONVERTED CEMR LAB
Ferritin: 19 ng/mL (ref 10–291)
Saturation Ratios: 22 % (ref 20–55)
TIBC: 419 ug/dL (ref 250–470)
UIBC: 325 ug/dL

## 2009-12-23 ENCOUNTER — Encounter: Payer: Self-pay | Admitting: Internal Medicine

## 2009-12-31 ENCOUNTER — Telehealth: Payer: Self-pay | Admitting: *Deleted

## 2010-01-03 ENCOUNTER — Ambulatory Visit: Payer: Self-pay | Admitting: Internal Medicine

## 2010-01-03 DIAGNOSIS — D509 Iron deficiency anemia, unspecified: Secondary | ICD-10-CM

## 2010-01-12 ENCOUNTER — Ambulatory Visit: Payer: Self-pay | Admitting: Internal Medicine

## 2010-01-14 ENCOUNTER — Encounter: Payer: Self-pay | Admitting: Internal Medicine

## 2010-02-03 ENCOUNTER — Ambulatory Visit: Payer: Self-pay | Admitting: Internal Medicine

## 2010-02-03 DIAGNOSIS — K921 Melena: Secondary | ICD-10-CM

## 2010-02-11 LAB — CONVERTED CEMR LAB: OCCULT 2: POSITIVE

## 2010-02-14 ENCOUNTER — Telehealth: Payer: Self-pay | Admitting: *Deleted

## 2010-02-17 ENCOUNTER — Encounter: Payer: Self-pay | Admitting: Internal Medicine

## 2010-03-01 ENCOUNTER — Encounter: Payer: Self-pay | Admitting: Internal Medicine

## 2010-03-02 ENCOUNTER — Ambulatory Visit: Payer: Self-pay | Admitting: Internal Medicine

## 2010-03-02 LAB — CONVERTED CEMR LAB
AST: 24 units/L (ref 0–37)
Albumin: 4.2 g/dL (ref 3.5–5.2)
BUN: 11 mg/dL (ref 6–23)
Blood Glucose, AC Bkfst: 134 mg/dL
CO2: 26 meq/L (ref 19–32)
Calcium: 9.5 mg/dL (ref 8.4–10.5)
Chloride: 98 meq/L (ref 96–112)
Creatinine, Ser: 0.65 mg/dL (ref 0.40–1.20)
Eosinophils Absolute: 0.4 10*3/uL (ref 0.0–0.7)
Eosinophils Relative: 6 % — ABNORMAL HIGH (ref 0–5)
Glucose, Bld: 130 mg/dL — ABNORMAL HIGH (ref 70–99)
HCT: 38.3 % (ref 36.0–46.0)
Hemoglobin: 12.1 g/dL (ref 12.0–15.0)
Lymphocytes Relative: 30 % (ref 12–46)
Lymphs Abs: 2 10*3/uL (ref 0.7–4.0)
MCV: 91.8 fL (ref >=78.0)
Monocytes Absolute: 0.3 10*3/uL (ref 0.1–1.0)
Monocytes Relative: 5 % (ref 3–12)
Potassium: 4.2 meq/L (ref 3.5–5.3)
WBC: 6.5 10*3/uL (ref 4.0–10.5)

## 2010-03-09 ENCOUNTER — Telehealth: Payer: Self-pay | Admitting: Internal Medicine

## 2010-03-10 ENCOUNTER — Telehealth: Payer: Self-pay | Admitting: Internal Medicine

## 2010-03-22 ENCOUNTER — Encounter: Payer: Self-pay | Admitting: Internal Medicine

## 2010-03-28 ENCOUNTER — Telehealth: Payer: Self-pay | Admitting: *Deleted

## 2010-03-29 ENCOUNTER — Encounter: Payer: Self-pay | Admitting: Internal Medicine

## 2010-04-08 ENCOUNTER — Encounter: Payer: Self-pay | Admitting: Internal Medicine

## 2010-04-12 ENCOUNTER — Encounter: Payer: Self-pay | Admitting: Internal Medicine

## 2010-04-20 ENCOUNTER — Ambulatory Visit: Payer: Self-pay | Admitting: Internal Medicine

## 2010-04-20 LAB — CONVERTED CEMR LAB
Basophils Absolute: 0 10*3/uL (ref 0.0–0.1)
Basophils Relative: 1 % (ref 0–1)
Cholesterol: 239 mg/dL — ABNORMAL HIGH (ref 0–200)
Eosinophils Absolute: 0.3 10*3/uL (ref 0.0–0.7)
Eosinophils Relative: 5 % (ref 0–5)
Ferritin: 54 ng/mL (ref 10–291)
HCT: 37.6 % (ref 36.0–46.0)
HDL: 39 mg/dL — ABNORMAL LOW (ref >39)
Hemoglobin: 12.6 g/dL (ref 12.0–15.0)
Hgb A1c MFr Bld: 6.6 %
LDL Cholesterol: 124 mg/dL — ABNORMAL HIGH (ref 0–99)
Lymphocytes Relative: 31 % (ref 12–46)
Lymphs Abs: 2 10*3/uL (ref 0.7–4.0)
MCHC: 33.5 g/dL (ref 30.0–36.0)
MCV: 88.5 fL (ref >=78.0)
Monocytes Absolute: 0.3 10*3/uL (ref 0.1–1.0)
Monocytes Relative: 5 % (ref 3–12)
Neutro Abs: 3.8 10*3/uL (ref 1.7–7.7)
Neutrophils Relative %: 59 % (ref 43–77)
Platelets: 223 10*3/uL (ref 150–400)
RBC: 4.25 M/uL (ref 3.87–5.11)
RDW: 13.6 % (ref 11.5–15.5)
TSH: 1.878 microintl units/mL (ref 0.350–4.5)
Total CHOL/HDL Ratio: 6.1
Triglycerides: 378 mg/dL — ABNORMAL HIGH (ref <150)
VLDL: 76 mg/dL — ABNORMAL HIGH (ref 0–40)
WBC: 6.4 10*3/uL (ref 4.0–10.5)

## 2010-04-27 ENCOUNTER — Telehealth: Payer: Self-pay | Admitting: Internal Medicine

## 2010-04-28 ENCOUNTER — Ambulatory Visit: Payer: Self-pay | Admitting: Internal Medicine

## 2010-04-29 ENCOUNTER — Encounter (INDEPENDENT_AMBULATORY_CARE_PROVIDER_SITE_OTHER): Payer: Self-pay | Admitting: Internal Medicine

## 2010-04-29 LAB — CONVERTED CEMR LAB
AST: 17 units/L (ref 0–37)
Albumin: 4.4 g/dL (ref 3.5–5.2)
BUN: 9 mg/dL (ref 6–23)
Benzodiazepines.: POSITIVE — AB
CO2: 30 meq/L (ref 19–32)
Calcium: 9.1 mg/dL (ref 8.4–10.5)
Chloride: 90 meq/L — ABNORMAL LOW (ref 96–112)
Creatinine, Ser: 0.61 mg/dL (ref 0.40–1.20)
Creatinine, Urine: 71.3 mg/dL
Creatinine,U: 71.9 mg/dL
Eosinophils Absolute: 0.4 10*3/uL (ref 0.0–0.7)
Eosinophils Relative: 5 % (ref 0–5)
Free T4: 1.1 ng/dL (ref 0.80–1.80)
Glucose, Bld: 126 mg/dL — ABNORMAL HIGH (ref 70–99)
MCHC: 32.6 g/dL (ref 30.0–36.0)
Marijuana Metabolite: NEGATIVE
Methadone: NEGATIVE
Microalb, Ur: 0.5 mg/dL (ref 0.00–1.89)
Monocytes Absolute: 0.4 10*3/uL (ref 0.1–1.0)
Monocytes Relative: 5 % (ref 3–12)
Neutro Abs: 4.7 10*3/uL (ref 1.7–7.7)
Neutrophils Relative %: 59 % (ref 43–77)
Opiates: NEGATIVE
Phencyclidine (PCP): NEGATIVE
Potassium: 3.7 meq/L (ref 3.5–5.3)
Propoxyphene: NEGATIVE
RDW: 13.8 % (ref 11.5–15.5)

## 2010-05-11 ENCOUNTER — Ambulatory Visit: Payer: Self-pay | Admitting: Internal Medicine

## 2010-05-11 LAB — CONVERTED CEMR LAB
AST: 19 units/L (ref 0–37)
Albumin: 4.4 g/dL (ref 3.5–5.2)
Alkaline Phosphatase: 71 units/L (ref 39–117)
BUN: 10 mg/dL (ref 6–23)
Bilirubin Urine: NEGATIVE
Glucose, Bld: 120 mg/dL — ABNORMAL HIGH (ref 70–99)
Ketones, ur: NEGATIVE mg/dL
Potassium: 3.9 meq/L (ref 3.5–5.3)
Sodium: 133 meq/L — ABNORMAL LOW (ref 135–145)
Specific Gravity, Urine: 1.009 (ref 1.005–1.0)
Total Bilirubin: 0.3 mg/dL (ref 0.3–1.2)
Urine Glucose: NEGATIVE mg/dL

## 2010-05-16 ENCOUNTER — Telehealth (INDEPENDENT_AMBULATORY_CARE_PROVIDER_SITE_OTHER): Payer: Self-pay | Admitting: *Deleted

## 2010-08-09 ENCOUNTER — Encounter: Payer: Self-pay | Admitting: Internal Medicine

## 2010-08-10 ENCOUNTER — Encounter: Payer: Self-pay | Admitting: Internal Medicine

## 2010-08-19 ENCOUNTER — Encounter: Payer: Self-pay | Admitting: Internal Medicine

## 2010-08-26 ENCOUNTER — Encounter: Payer: Self-pay | Admitting: Internal Medicine

## 2010-08-26 ENCOUNTER — Ambulatory Visit: Payer: Self-pay | Admitting: Internal Medicine

## 2010-09-01 ENCOUNTER — Encounter: Admission: RE | Admit: 2010-09-01 | Discharge: 2010-09-01 | Payer: Self-pay | Admitting: Gastroenterology

## 2010-09-05 ENCOUNTER — Telehealth: Payer: Self-pay | Admitting: Internal Medicine

## 2010-09-05 ENCOUNTER — Emergency Department (HOSPITAL_COMMUNITY): Admission: EM | Admit: 2010-09-05 | Discharge: 2010-09-05 | Payer: Self-pay | Admitting: Family Medicine

## 2010-09-06 ENCOUNTER — Telehealth: Payer: Self-pay | Admitting: Internal Medicine

## 2010-09-07 ENCOUNTER — Telehealth: Payer: Self-pay | Admitting: *Deleted

## 2010-09-09 ENCOUNTER — Encounter: Payer: Self-pay | Admitting: Internal Medicine

## 2010-09-22 ENCOUNTER — Encounter: Payer: Self-pay | Admitting: Internal Medicine

## 2010-09-27 ENCOUNTER — Encounter: Payer: Self-pay | Admitting: Internal Medicine

## 2010-10-04 ENCOUNTER — Encounter: Payer: Self-pay | Admitting: Internal Medicine

## 2010-10-05 ENCOUNTER — Ambulatory Visit: Payer: Self-pay | Admitting: Internal Medicine

## 2010-10-05 LAB — CONVERTED CEMR LAB
Blood Glucose, AC Bkfst: 127 mg/dL
Hgb A1c MFr Bld: 6 %

## 2010-10-07 ENCOUNTER — Telehealth: Payer: Self-pay | Admitting: Internal Medicine

## 2010-10-07 LAB — CONVERTED CEMR LAB
ALT: 21 units/L (ref 0–35)
AST: 21 units/L (ref 0–37)
Albumin: 4.7 g/dL (ref 3.5–5.2)
CO2: 30 meq/L (ref 19–32)
Calcium: 9.6 mg/dL (ref 8.4–10.5)
Chloride: 96 meq/L (ref 96–112)
Cholesterol: 260 mg/dL — ABNORMAL HIGH (ref 0–200)
Potassium: 4.1 meq/L (ref 3.5–5.3)
Sodium: 136 meq/L (ref 135–145)
Total CHOL/HDL Ratio: 5.9
Total Protein: 7.3 g/dL (ref 6.0–8.3)

## 2010-10-12 ENCOUNTER — Encounter: Payer: Self-pay | Admitting: Internal Medicine

## 2010-10-18 ENCOUNTER — Encounter: Admission: RE | Admit: 2010-10-18 | Discharge: 2010-10-18 | Payer: Self-pay | Admitting: Orthopedic Surgery

## 2010-10-26 ENCOUNTER — Telehealth: Payer: Self-pay | Admitting: *Deleted

## 2010-10-26 ENCOUNTER — Telehealth: Payer: Self-pay | Admitting: Internal Medicine

## 2010-10-27 ENCOUNTER — Encounter: Payer: Self-pay | Admitting: Internal Medicine

## 2010-11-02 ENCOUNTER — Ambulatory Visit (HOSPITAL_COMMUNITY)
Admission: RE | Admit: 2010-11-02 | Discharge: 2010-11-02 | Payer: Self-pay | Source: Home / Self Care | Attending: Internal Medicine | Admitting: Internal Medicine

## 2010-11-04 ENCOUNTER — Telehealth: Payer: Self-pay | Admitting: Internal Medicine

## 2010-11-22 ENCOUNTER — Encounter: Payer: Self-pay | Admitting: Internal Medicine

## 2010-11-22 ENCOUNTER — Telehealth: Payer: Self-pay | Admitting: *Deleted

## 2010-11-29 ENCOUNTER — Encounter: Payer: Self-pay | Admitting: Internal Medicine

## 2010-11-29 ENCOUNTER — Ambulatory Visit: Admission: RE | Admit: 2010-11-29 | Discharge: 2010-11-29 | Payer: Self-pay | Source: Home / Self Care

## 2010-11-29 LAB — CONVERTED CEMR LAB: Blood Glucose, Fingerstick: 120

## 2010-12-05 LAB — GLUCOSE, CAPILLARY: Glucose-Capillary: 120 mg/dL — ABNORMAL HIGH (ref 70–99)

## 2010-12-06 ENCOUNTER — Encounter
Admission: RE | Admit: 2010-12-06 | Discharge: 2010-12-06 | Payer: Self-pay | Source: Home / Self Care | Attending: Gastroenterology | Admitting: Gastroenterology

## 2010-12-09 ENCOUNTER — Encounter
Admission: RE | Admit: 2010-12-09 | Discharge: 2010-12-09 | Payer: Self-pay | Source: Home / Self Care | Attending: Internal Medicine | Admitting: Internal Medicine

## 2010-12-11 ENCOUNTER — Encounter: Payer: Self-pay | Admitting: Orthopedic Surgery

## 2010-12-11 ENCOUNTER — Encounter: Payer: Self-pay | Admitting: Obstetrics

## 2010-12-11 ENCOUNTER — Encounter: Payer: Self-pay | Admitting: Internal Medicine

## 2010-12-11 ENCOUNTER — Encounter: Payer: Self-pay | Admitting: Emergency Medicine

## 2010-12-22 NOTE — Assessment & Plan Note (Signed)
Summary: acute-feels tired and weak/cfb(joines)   Vital Signs:  Patient profile:   50 year old female Height:      62 inches (157.48 cm) Weight:      208.8 pounds (94.91 kg) BMI:     38.33 O2 Sat:      100 % on Room air Temp:     96.4 degrees F (35.78 degrees C) oral Pulse rate:   71 / minute Pulse (ortho):   77 / minute BP sitting:   117 / 74  (right arm) BP standing:   101 / 77  Vitals Entered By: Stanton Kidney Ditzler RN (April 28, 2010 9:10 AM)  O2 Flow:  Room air  Serial Vital Signs/Assessments:  Time      Position  BP       Pulse  Resp  Temp     By 9:05AM    Lying RA  115/64   80                    Debra Ditzler RN 9:05AM    Sitting   120/76   73                    Debra Ditzler RN 9:05AM    Standing  101/77   77                    Debra Ditzler RN  CC: Depression Is Patient Diabetic? Yes Did you bring your meter with you today? No Pain Assessment Patient in pain? yes     Location: legs Intensity: 5 Type: throbbing Onset of pain  since 10/2009 Nutritional Status BMI of 25 - 29 = overweight Nutritional Status Detail appetite down CBG Result 148  Have you ever been in a relationship where you felt threatened, hurt or afraid?denies   Does patient need assistance? Functional Status Self care Ambulation Impaired:Risk for fall Comments Uses a cane. Sister with pt. Since 04/22/10 - has had SOB, dizziness, confusion, tired and no energy.   Primary Care Provider:  [name] MD  CC:  Depression.  History of Present Illness: Mary Gray comes for following today:   1. Dizziness/weakness/confusion: This is going on for the weekend in on/off fashion. For example when she is talking over phone, she doesnot know what she is talking. SHe is weak. She just wants to sit down and doesnot want to walk. She also has dizziness. She has really not been out in the sun. She has chronic leg and back pain. She went to orthopedics and she had cortisone shots on her  left knee. No  fever/chills. She endorses some difficulty breathing because she noted taking deeper breath. Her fingers on the left side are tingly and numb, but this is present for 2 months. No assymetric weakness. No droppiness of mouth. She also has new onset blurry vision. She also has new onset HA, especially at the front and the sides. The HA is worse whenever she is more confused. No CP. No LOC. No leg swelling. NO long distance travel. No sick contacts. She endorses mild nausea but no vomiting, she had "little bit" diarrhea. No blood in stool, but she has black stool as she is taking her iron.  Her last periods was a year and half ago. She checked her blood sugar whenever she has these symptoms and usually it is normal, except one episode of low reading as described below. No recent changes in her meds except her  pain meds, which she is not taking so often. SHe doesnot take glipizide, but she takes alleve. She was also started on an "NSAIDS" by her orthopod. No dysuria or urinary frequency.   2. DM: She checks CBG two times a day. Her lowest was 56 2 days ago when she checked after she did not feel well. Otherwise, she runs usually in 130-150. She did not bring her meter today.   3. She feel depressed. "It's running my anxiety up". She takes xanax and zoloft 100mg  daily. SHe gets zoloft from her psychiatrist. Last time she saw psychiatrist 2-3 mo ago and her next appt is in July. No SI.   Depression History:      The patient denies a depressed mood most of the day and a diminished interest in her usual daily activities.         Preventive Screening-Counseling & Management  Alcohol-Tobacco     Smoking Status: quit     Smoking Cessation Counseling: yes     Year Quit: 1997     Passive Smoke Exposure: no  Caffeine-Diet-Exercise     Does Patient Exercise: yes     Type of exercise: WALKING     Times/week: 5-7W  Current Medications (verified): 1)  Enalapril Maleate 10 Mg Tabs (Enalapril Maleate) .... Take  2 Tablets By Mouth Once A Day 2)  Ventolin Hfa 108 (90 Base) Mcg/act Aers (Albuterol Sulfate) .... Inhale 2 Puffs Four Times A Day  As Needed 3)  Azmacort 100 Mcg/act Aers (Triamcinolone Acetonide(Inhal)) .... Inhale 2 Puffs Two Times A Day 4)  Xanax 1 Mg Tabs (Alprazolam) .... Take 1 Tablet By Mouth Four Times A Day For Anxiety 5)  Synthroid 75 Mcg Tabs (Levothyroxine Sodium) .... Take 1 Tablet By Mouth Once A Day 6)  Glucophage Xr 500 Mg  Tb24 (Metformin Hcl) .... Take 2 Tablets By Mouth Each Am and 1 Tablet Each Pm 7)  Lipitor 10 Mg Tabs (Atorvastatin Calcium) .... Take 1 Tablet By Mouth Once A Day 8)  Nexium 40 Mg Cpdr (Esomeprazole Magnesium) .... Take 1 Capsule By Mouth Once A Day 9)  Aspirin 81 Mg Tbec (Aspirin) .... Take 1 Tablet By Mouth Once A Day 10)  Flonase 50 Mcg/act Susp (Fluticasone Propionate) .... Take 2 Sprays in Each Nostril Once A Day 11)  Metamucil  Powd (Psyllium Powd) .... Take 1 Tablespoonful in 8 Ounces of Water Once A Day To Three Times A Day As Needed For Constipation 12)  Colace 100 Mg  Caps (Docusate Sodium) .... Take 1 Capsule By Mouth Two Times A Day. 13)  Carisoprodol 350 Mg Tabs (Carisoprodol) .... Take 1 Tablet By Mouth Four Times A Day As Needed For Muscle Spasm 14)  Nystatin 100000 Unit/gm Powd (Nystatin) .... Apply To Affected Area Two Times A Day For 10 Days 15)  Hydrochlorothiazide 12.5 Mg Caps (Hydrochlorothiazide) .... Take 1 Capsule By Mouth Once A Day 16)  Tramadol Hcl 50 Mg Tabs (Tramadol Hcl) .... Take 1 Tablet By Mouth Every 6 Hours As Needed For Back Pain 17)  Fexofenadine Hcl 60 Mg Tabs (Fexofenadine Hcl) .... Take 1 Tablet By Mouth Two Times A Day For Allergies 18)  Gabapentin 300 Mg Caps (Gabapentin) .... Take 1 Capsule By Mouth Four Times A Day 19)  Erythromycin Ethylsuccinate 400 Mg Tabs (Erythromycin Ethylsuccinate) .... Take 1 Tablet By Mouth Two Times A Day 20)  Hydromorphone Hcl 2 Mg Tabs (Hydromorphone Hcl) .... Take 1 Tablet By Mouth Every  6 Hours  As Needed For Pain 21)  Ferrous Sulfate 325 (65 Fe) Mg Tabs (Ferrous Sulfate) .... Take 1 Tablet By Mouth Three Times A Day  Allergies: 1)  ! Codeine Sulfate (Codeine Sulfate) 2)  ! Penicillin V Potassium (Penicillin V Potassium) 3)  ! Tetracycline Hcl (Tetracycline Hcl) 4)  ! Minocycline Hcl (Minocycline Hcl) 5)  ! Darvocet-N 100  Review of Systems      See HPI  Physical Exam  General:  alert.   Mouth:  pharynx pink and moist.   Lungs:  normal breath sounds, no crackles, and no wheezes.   Heart:  normal rate, regular rhythm, no murmur, no gallop, and no rub.   Abdomen:  soft, non-tender, normal bowel sounds, no distention, no masses, and no guarding.   Extremities:  trace left pedal edema and trace right pedal edema.   Neurologic:  alert & oriented X3, cranial nerves II-XII intact, strength normal in all extremities, sensation intact to light touch, gait normal, finger-to-nose normal, and toes down bilaterally on Babinski.   Cervical Nodes:  no anterior cervical adenopathy and no posterior cervical adenopathy.   Psych:  Oriented X3.     Impression & Recommendations:  Problem # 1:  WEAKNESS (ICD-780.79) Please see HPI. Pt has vauge complaints of dizziness/confusion/weakness. She is not orthostatic (or had symptoms of dizziness during position changes) and exam including complete neuro exam is WNL. No s/s of infection or dehydration. Hb was 12.6 a week ago, highest value for her in [facility] EMR record. TSH was WNL last time, will check free T4 today. I do not think this is stroke, or MI (no any symptoms suggestive of the same). I donot think this is also from hypoglycemic episodes as she is checking her CBG everytime she has these symptoms.  No new medication changes except a new NSAID by her orthopod per pt and her glipizide was stopped. She is taking her narcotics for a long time. She states she feels more depressed and is taking xanax and zoloft (latter prescribed by her  psychiatrist). This could be from worsening depression. She has an appt with her psychiatrist in July. I encouraged her to call her doctor for an earlier appt. I will check followings today, including B12 level. Will f/u in 2 wks or sooner if symptoms worsen. Her last periods was 2 yrs ago and she also endorses hot flashes. This could have played a role as well, but I doubt it is the sole cause of acute worsening of her symptoms. She is already on neurontin and an option on f/u may be to increase the dose, if further history suggests menopausal symptoms.  Orders: T-T4, Free [phone]) T-Drug Screen-Urine, (single) [phone]) T-CBC w/Diff [phone]) T-Comprehensive Metabolic Panel [phone]) T- * Misc. Laboratory test [phone])  Problem # 2:  DIABETES MELLITUS, TYPE II (ICD-250.00) Please see HPI. She didnot bring her meter and she only had 1 hypoglycemic episode. Plan is to cont same and check followings.  Her updated medication list for this problem includes:    Enalapril Maleate 10 Mg Tabs (Enalapril maleate) .Marland Kitchen... Take 2 tablets by mouth once a day    Glucophage Xr 500 Mg Tb24 (Metformin hcl) .Marland Kitchen... Take 2 tablets by mouth each am and 1 tablet each pm    Aspirin 81 Mg Tbec (Aspirin) .Marland Kitchen... Take 1 tablet by mouth once a day  Orders: Capillary Blood Glucose/CBG ([id number]) T-Urine Microalbumin w/creat. ratio [id number]) T-Urine Microalbumin w/creat. ratio [phone])  Problem # 3:  HYPERTENSION (ICD-401.9) At goal.  Will cont same and check CMET.  Her updated medication list for this problem includes:    Enalapril Maleate 10 Mg Tabs (Enalapril maleate) .Marland Kitchen... Take 2 tablets by mouth once a day    Hydrochlorothiazide 12.5 Mg Caps (Hydrochlorothiazide) .Marland Kitchen... Take 1 capsule by mouth once a day  Orders: T-Drug Screen-Urine, (single) [id number])  BP today: 117/74 Prior BP: 118/71 (04/20/2010)  Labs Reviewed: K+: 4.2 (03/02/2010) Creat: : 0.65 (03/02/2010)   Chol: 239  (04/20/2010)   HDL: 39 (04/20/2010)   LDL: 124 (04/20/2010)   TG: 378 (04/20/2010)  Problem # 4:  HYPOTHYROIDISM (ICD-244.9) TSH was normal last time. Will her new symptoms, will check following.  Her updated medication list for this problem includes:    Synthroid 75 Mcg Tabs (Levothyroxine sodium) .Marland Kitchen... Take 1 tablet by mouth once a day  Orders: T-T4, Free [phone])  Problem # 5:  ANEMIA, IRON DEFICIENCY (ICD-280.9) Reviewed her labs with her. Encouraged to continue to take iron.  Her updated medication list for this problem includes:    Ferrous Sulfate 325 (65 Fe) Mg Tabs (Ferrous sulfate) .Marland Kitchen... Take 1 tablet by mouth three times a day  Problem # 6:  HYPERCHOLESTEROLEMIA (ICD-272.0) I noted Dr. Mendel Ryder comment on this problem last time. He is planning to switch statin to a different class as pt had interaction with erythromycin. I donot know about the details and I defer this to Dr. Meredith Pel.  Her updated medication list for this problem includes:    Lipitor 10 Mg Tabs (Atorvastatin calcium) .Marland Kitchen... Take 1 tablet by mouth once a day  Complete Medication List: 1)  Enalapril Maleate 10 Mg Tabs (Enalapril maleate) .... Take 2 tablets by mouth once a day 2)  Ventolin Hfa 108 (90 Base) Mcg/act Aers (Albuterol sulfate) .... Inhale 2 puffs four times a day  as needed 3)  Azmacort 100 Mcg/act Aers (Triamcinolone acetonide(inhal)) .... Inhale 2 puffs two times a day 4)  Xanax 1 Mg Tabs (Alprazolam) .... Take 1 tablet by mouth four times a day for anxiety 5)  Synthroid 75 Mcg Tabs (Levothyroxine sodium) .... Take 1 tablet by mouth once a day 6)  Glucophage Xr 500 Mg Tb24 (Metformin hcl) .... Take 2 tablets by mouth each am and 1 tablet each pm 7)  Lipitor 10 Mg Tabs (Atorvastatin calcium) .... Take 1 tablet by mouth once a day 8)  Nexium 40 Mg Cpdr (Esomeprazole magnesium) .... Take 1 capsule by mouth once a day 9)  Aspirin 81 Mg Tbec (Aspirin) .... Take 1 tablet by mouth once a day 10)   Flonase 50 Mcg/act Susp (Fluticasone propionate) .... Take 2 sprays in each nostril once a day 11)  Metamucil Powd (Psyllium powd) .... Take 1 tablespoonful in 8 ounces of water once a day to three times a day as needed for constipation 12)  Colace 100 Mg Caps (Docusate sodium) .... Take 1 capsule by mouth two times a day. 13)  Carisoprodol 350 Mg Tabs (Carisoprodol) .... Take 1 tablet by mouth four times a day as needed for muscle spasm 14)  Nystatin 100000 Unit/gm Powd (Nystatin) .... Apply to affected area two times a day for 10 days 15)  Hydrochlorothiazide 12.5 Mg Caps (Hydrochlorothiazide) .... Take 1 capsule by mouth once a day 16)  Tramadol Hcl 50 Mg Tabs (Tramadol hcl) .... Take 1 tablet by mouth every 6 hours as needed for back pain 17)  Fexofenadine Hcl 60 Mg Tabs (Fexofenadine hcl) .... Take 1 tablet by mouth two times  a day for allergies 18)  Gabapentin 300 Mg Caps (Gabapentin) .... Take 1 capsule by mouth four times a day 19)  Erythromycin Ethylsuccinate 400 Mg Tabs (Erythromycin ethylsuccinate) .... Take 1 tablet by mouth two times a day 20)  Hydromorphone Hcl 2 Mg Tabs (Hydromorphone hcl) .... Take 1 tablet by mouth every 6 hours as needed for pain 21)  Ferrous Sulfate 325 (65 Fe) Mg Tabs (Ferrous sulfate) .... Take 1 tablet by mouth three times a day  Patient Instructions: 1)  F/U with Dr. Meredith Pel on 6/22.  2)  Please schedule an appt with your psychiatrist earlier than July.  3)  If your symptoms worse please call the clinic.  4)  Limit your Sodium (Salt) to less than 2 grams a day(slightly less than 1/2 a teaspoon) to prevent fluid retention, swelling, or worsening of symptoms. 5)  It is important that you exercise regularly at least 20 minutes 5 times a week. If you develop chest pain, have severe difficulty breathing, or feel very tired , stop exercising immediately and seek medical attention. 6)  You need to lose weight. Consider a lower calorie diet and regular exercise.  7)   Check your Blood Pressure regularly. If it is above: you should make an appointment. Process Orders Check Orders Results:     [facility]: Order checked:     [id number] -- T- * Misc. Laboratory test -- No CPT codes found (CPT: ) Tests Sent for requisitioning (April 28, 2010 12:55 PM):     04/28/2010: [facility] -- T-Urine Microalbumin w/creat. ratio [[id number]] (signed)     04/28/2010: [facility] -- T-T4, [place] [[id number]] (signed)     04/28/2010: [facility] -- T-Drug Screen-Urine, (single) [[id number]] (signed)     04/28/2010: [facility] -- T-CBC w/Diff [[id number]] (signed)     04/28/2010: [facility] -- T-Comprehensive Metabolic Panel [[id number]] (signed)     04/28/2010: [facility] -- T- * Misc. Laboratory test [phone] (signed)     04/28/2010: [facility] -- T-Urine Microalbumin w/creat. ratio [[id number]] (signed)    Prevention & Chronic Care Immunizations   Influenza vaccine: Fluvax 3+  (11/24/2009)   Influenza vaccine due: 07/21/2009    Tetanus booster: 08/21/2007: Td    Pneumococcal vaccine: Not documented  Other Screening   Pap smear: Interpretation:Negative for intraepithelial lesions or malignancy.     (04/05/2007)    Mammogram: ASSESSMENT: Negative - BI-RADS 1  (05/04/2009)   Mammogram due: 04/2010   Smoking status: quit  (04/28/2010)  Diabetes Mellitus   HgbA1C: 6.6  (04/20/2010)   HgbA1C action/deferral: Ordered  (03/02/2010)   Hemoglobin A1C due: 06/01/2010    Eye exam: No diabetic retinopathy.   Exam by Dr. Ernesto Rutherford  (12/10/2009)   Eye exam due: 12/2010    Foot exam: yes  (04/20/2010)   Foot exam action/deferral: Do today   High risk foot: Yes  (07/07/2009)   Foot care education: Done  (04/20/2010)   Foot exam due: 10/07/2009    Urine microalbumin/creatinine ratio: 9.2  (04/21/2009)   Urine microalbumin  action/deferral: Ordered   Urine microalbumin/cr due: 04/21/2010    Diabetes flowsheet reviewed?: Yes   Progress toward A1C goal: Unchanged  Lipids   Total Cholesterol: 239  (04/20/2010)   Lipid panel action/deferral: LDL Direct Ordered   LDL: 841  (04/20/2010)   LDL Direct: 101  (07/07/2009)   HDL: 39  (04/20/2010)   Triglycerides: 378  (04/20/2010)   Lipid panel  due: 05/24/2010    SGOT (AST): 24  (03/02/2010)   SGPT (ALT): 25  (03/02/2010) CMP ordered    Alkaline phosphatase: 72  (03/02/2010)   Total bilirubin: 0.4  (03/02/2010)   Liver panel due: 05/24/2010    Lipid flowsheet reviewed?: Yes   Progress toward LDL goal: Unchanged  Hypertension   Last Blood Pressure: 117 / 74  (04/28/2010)   Serum creatinine: 0.65  (03/02/2010)   Serum potassium 4.2  (03/02/2010) CMP ordered    Basic metabolic panel due: 09/01/2010    Hypertension flowsheet reviewed?: Yes   Progress toward BP goal: Unchanged  Self-Management Support :   Personal Goals (by the next clinic visit) :     Personal A1C goal: 7  (12/15/2009)     Personal blood pressure goal: 130/80  (12/15/2009)     Personal LDL goal: 100  (12/15/2009)    Patient will work on the following items until the next clinic visit to reach self-care goals:     Medications and monitoring: take my medicines every day, check my blood sugar, bring all of my medications to every visit, weigh myself weekly, examine my feet every day  (04/28/2010)     Eating: drink diet soda or water instead of juice or soda, eat more vegetables, use fresh or frozen vegetables, eat fruit for snacks and desserts, limit or avoid alcohol  (04/28/2010)     Home glucose monitoring frequency: 1 time daily  (04/20/2010)    Diabetes self-management support: Written self-care plan, Education handout, Resources for patients handout  (04/28/2010)   Diabetes care plan printed   Diabetes education handout printed    Hypertension self-management support: Written  self-care plan, Education handout, Resources for patients handout  (04/28/2010)   Hypertension self-care plan printed.   Hypertension education handout printed    Lipid self-management support: Written self-care plan, Education handout, Resources for patients handout  (04/28/2010)   Lipid self-care plan printed.   Lipid education handout printed      Resource handout printed.  Process Orders Check Orders Results:     [facility]: Order checked:     [id number] -- T- * Misc. Laboratory test -- No CPT codes found (CPT: ) Tests Sent for requisitioning (April 28, 2010 12:55 PM):     04/28/2010: [facility] -- T-Urine Microalbumin w/creat. ratio [[id number]] (signed)     04/28/2010: [facility] -- T-T4, [place] [[id number]] (signed)     04/28/2010: [facility] -- T-Drug Screen-Urine, (single) [[id number]] (signed)     04/28/2010: [facility] -- T-CBC w/Diff [[id number]] (signed)     04/28/2010: [facility] -- T-Comprehensive Metabolic Panel [[id number]] (signed)     04/28/2010: [facility] -- T- * Misc. Laboratory test [phone] (signed)     04/28/2010: [facility] -- T-Urine Microalbumin w/creat. ratio [[id number]] (signed)

## 2010-12-22 NOTE — Consult Note (Signed)
Summary: [facility]  [facility]   Imported By: Margie Billet 05/26/2010 13:38:12  _____________________________________________________________________  External Attachment:    Type:   Image     Comment:   External Document

## 2010-12-22 NOTE — Assessment & Plan Note (Signed)
Summary: F/U POST MRI/L KNEE PAIN AND BACK PAIN   Vital Signs:  Patient profile:   50 year old female BP sitting:   117 / 77  Vitals Entered By: Lillia Pauls CMA (December 09, 2009 2:16 PM)  Primary Provider:  [name] MD   History of Present Illness: Pt presents for follow-up for her left knee and back pain which has been presents since she fell back in December of 2010. She was referred to [place] by Dr. Meredith Pel of internal medicine. She describes going outside and slipping on ice on or about November 14, 2009. When she fell her left leg and knee went behind her and her right leg went out to her side and she hit her head and back on the side of the house/trailer during the fall. She had pain in swelling in her knee but went to urgent care the following day and had x-rays performed there that were negative per her report. She then saw her regular doctor because of persistent back pain and knee pain. She has been on dilaudid for her pain since then. She was also initially using a knee imobilizer as well as a single point cane. She is still having intermittent swelling and severe pain with twisting and deep bending. Only the dilaudid seems to help but she is interested in better bracing modalities.   Her back pain is mainly in the center of her back with burning and stinging radiating down into her left knee. She has a hard time moving because of this pain and has had back problems for many years.   Allergies: 1)  ! Codeine Sulfate (Codeine Sulfate) 2)  ! Penicillin V Potassium (Penicillin V Potassium) 3)  ! Tetracycline Hcl (Tetracycline Hcl) 4)  ! Minocycline Hcl (Minocycline Hcl) 5)  ! Darvocet-N 100 (Propoxyphene N-Apap)  Physical Exam  General:  alert and well-developed.   Head:  normocephalic and atraumatic.   Msk:  Left Knee: No edema, bruising or bony abnormalities. Terminal extension is painful but she is able to do it actively. Flexion to 90 degrees only before pain stops her. +  TTP mainly along the medial joint line and along MCL but also along lateral joint line No significant pain or gapping with MCL testing. Normal LCL.  + McMurray's testing and flexion pinch Normal anterior and posterior drawer signs 4/5 strength with resisted knee flexion and extension  Right Knee: No bony abnormalities, edema or bruising. Full extension and fleixon without pain + TTP along the medial joint line Normal MCL and LCL. Normal anterior and posterior drawer signs 4/5 strength with resisted knee flexion and extension Neg McMurray's and flexion pinch  Back: No bony abnormalities or obvious scoliosis + TTP at L4-S1 No TTP in the SI joints Forward flexion to 75 degrees, pain with 10 degrees of extension, leans side to side with mild pain and rotates well with mild pain + SLR on the left but not on the right Difficulty walking on her heels and toes Decreased reflex on the right with patellar testing 4/5 strength of knee flexion and extension Decreased big toe strength on the right   Impression & Recommendations:  Problem # 1:  KNEE PAIN, LEFT (ICD-719.46) MRI results: Grade 1 MCL sprain, bone contusions of the lateral compartment without discrete fracture, osteochondroma of the medial tibial metaphysis.  Knee pain at this point with a lateral compartment bony contusion is normal. Reassured the patient that she will likely have pain for 6-8 weeks with intermittent  edema.  1. Continue current pain medications as needed per PCP. 2. Given a DonJoy brace today with lateral hinges to provide lateral support while allowing her to bend her knee. Can do easy range of motion leg extensions and bending. No need for a knee immobilizer at this time. 3. Referred for physical therapy for both her low back and her left knee. 4. Can ice her knee for 20 minutes daily to keep with any intermittent swelling. 5. Return to see sports medicine clinic or PCP in 4 weeks to make sure that she is  slowly improving.   Her updated medication list for this problem includes:    Aspirin 81 Mg Tbec (Aspirin) .Marland Kitchen... Take 1 tablet by mouth once a day    Carisoprodol 350 Mg Tabs (Carisoprodol) .Marland Kitchen... Take 1 tablet by mouth four times a day as needed for muscle spasm    Tramadol Hcl 50 Mg Tabs (Tramadol hcl) .Marland Kitchen... Take 1 tablet by mouth every 6 hours as needed for back pain    Hydromorphone Hcl 2 Mg Tabs (Hydromorphone hcl) .Marland Kitchen... Take 1 tablet by mouth every 6 hours as needed for pain  Orders: Knee Support Pat cutout [phone])  Problem # 2:  LOW BACK PAIN (ICD-724.2) Probably flared from her recent fall with possible nerve impingement. 1. Encouraged her to continue Neurontin, soma and home back exercises. 2. Referred for physical therapy. 3. May improve with a prednisone dospak to decrease inflammation around nerve if not tried already if PCP believes that her diabetes is controlled well enough for this type of treatment.  Her updated medication list for this problem includes:    Aspirin 81 Mg Tbec (Aspirin) .Marland Kitchen... Take 1 tablet by mouth once a day    Carisoprodol 350 Mg Tabs (Carisoprodol) .Marland Kitchen... Take 1 tablet by mouth four times a day as needed for muscle spasm    Tramadol Hcl 50 Mg Tabs (Tramadol hcl) .Marland Kitchen... Take 1 tablet by mouth every 6 hours as needed for back pain    Hydromorphone Hcl 2 Mg Tabs (Hydromorphone hcl) .Marland Kitchen... Take 1 tablet by mouth every 6 hours as needed for pain  Problem # 3:  DEGENERATIVE DISC DISEASE, LUMBAR SPINE (ICD-722.52) Assessment: Deteriorated Likley the cause of her low back pain 1. See above for treatment plan including PT  Complete Medication List: 1)  Enalapril Maleate 10 Mg Tabs (Enalapril maleate) .... Take 2 tablets by mouth once a day 2)  Albuterol 90 Mcg/act Aers (Albuterol) .... Inhale 2 puffs four times a day  as needed 3)  Azmacort 100 Mcg/act Aers (Triamcinolone acetonide(inhal)) .... Inhale 2 puffs two times a day 4)  Xanax 1 Mg Tabs (Alprazolam) ....  Take 1 tablet by mouth four times a day for anxiety 5)  Synthroid 75 Mcg Tabs (Levothyroxine sodium) .... Take 1 tablet by mouth once a day 6)  Glucophage Xr 500 Mg Tb24 (Metformin hcl) .... Take 2 tablets by mouth each am and 1 tablet each pm 7)  Lipitor 10 Mg Tabs (Atorvastatin calcium) .... Take 1 tablet by mouth once a day 8)  Nexium 40 Mg Cpdr (Esomeprazole magnesium) .... Take 1 capsule by mouth once a day 9)  Aspirin 81 Mg Tbec (Aspirin) .... Take 1 tablet by mouth once a day 10)  Flonase 50 Mcg/act Susp (Fluticasone propionate) .... Take 2 sprays in each nostril once a day 11)  Metamucil Powd (Psyllium powd) .... Take 1 tablespoonful in 8 ounces of water once a day to three times a  day as needed for constipation 12)  Glucotrol 5 Mg Tabs (Glipizide) .... Take 1 tablet by mouth two times a day 13)  Colace 100 Mg Caps (Docusate sodium) .... Take 1 capsule by mouth two times a day. 14)  Carisoprodol 350 Mg Tabs (Carisoprodol) .... Take 1 tablet by mouth four times a day as needed for muscle spasm 15)  Nystatin 100000 Unit/gm Powd (Nystatin) .... Apply to affected area two times a day for 10 days 16)  Hydrochlorothiazide 12.5 Mg Caps (Hydrochlorothiazide) .... Take 1 capsule by mouth once a day 17)  Tramadol Hcl 50 Mg Tabs (Tramadol hcl) .... Take 1 tablet by mouth every 6 hours as needed for back pain 18)  Fexofenadine Hcl 60 Mg Tabs (Fexofenadine hcl) .... Take 1 tablet by mouth two times a day for allergies 19)  Gabapentin 300 Mg Caps (Gabapentin) .... Take 1 capsule by mouth three times a day 20)  Erythromycin Ethylsuccinate 400 Mg Tabs (Erythromycin ethylsuccinate) .... Take 1 tablet by mouth two times a day 21)  Hydromorphone Hcl 2 Mg Tabs (Hydromorphone hcl) .... Take 1 tablet by mouth every 6 hours as needed for pain

## 2010-12-22 NOTE — Progress Notes (Signed)
Summary: appt/ [facility]  Phone Note Call from Patient   Summary of Call: pt calls c/o weakness, dizziness, tired x 38month, states this has started to interfere w/ her normal routine, does not even feel up to going to grocery store now, she has someone go for her. she is offered an appt this week w/ someone other than dr Meredith Pel but refuses, she states she would rather wait for him, she is given an appt for 6/22 and instructed if symptoms become worse or she feels she can't wait any longer that she go to either the ED or urg care, she is agreeable. she ask about lab values and is told that dr Meredith Pel has reviewed the results and did not feel that her meds needed to change or her normal care needed to change at this time or he would have notified her and added a new note to her chart and that he had not done this, she is encouraged to speak to him about her labs pt has now called back and feels she should not wait to be seen, [facility] has scheduled pt for 6/9 @ 0840, pt is agreeable, she was ask to go to urg care and refused and instructed to go to ED if she feels need before am appt Initial call taken by: Marin Roberts RN,  April 27, 2010 4:36 PM  Follow-up for Phone Call        Noted. Follow-up by: Zoila Shutter MD,  April 28, 2010 9:21 AM

## 2010-12-22 NOTE — Progress Notes (Signed)
Summary: ?[facility]I  Phone Note Call from Patient Message from:  Fax from Pharmacy on September 05, 2010 3:18 PM  Caller: Patient Call For: [name] MD Summary of Call: Call from pt said that she is not feeling well.  Still has the ciold like symptoms. Coughing up brownish mucous.  Has had some fevers.  Wants to know if she can get something else like an Antibiotic.  Pt was advised to go to Urgent Care as the Clinics have no available appointments for this afternoon. Initial call taken by: Angelina Ok RN,  September 05, 2010 3:20 PM  Follow-up for Phone Call        Agree with your plan. Follow-up by: Blanch Media MD,  September 05, 2010 3:48 PM

## 2010-12-22 NOTE — Letter (Signed)
Summary: Pharmacologist   Imported By: Florinda Marker 01/13/2010 15:24:38  _____________________________________________________________________  External Attachment:    Type:   Image     Comment:   External Document

## 2010-12-22 NOTE — Assessment & Plan Note (Signed)
Summary: L leg pain from fall, see note/pcp-joines/hla   Vital Signs:  Patient profile:   50 year old female Height:      62 inches (157.48 cm) Weight:      220.9 pounds (107.73 kg) BMI:     43.50 Temp:     97.0 degrees F (36.11 degrees C) oral Pulse rate:   97 / minute BP sitting:   153 / 87  (right arm)  Vitals Entered By: Theotis Barrio NT II (January 03, 2010 3:03 PM) CC: BILATERAL LEG PAIN #9 / BACK PAIN #9   /  MEDICATON REFILL, Is Patient Diabetic? Yes Did you bring your meter with you today? No Pain Assessment Patient in pain? yes     Location: LEGS / BACK Intensity:    9 Type: ALL Onset of pain  CHRONIC BACK PAIN /  LEG PAIN IN DEC 010 PATIENT FELL Nutritional Status BMI of > 30 = obese  Have you ever been in a relationship where you felt threatened, hurt or afraid?No   Does patient need assistance? Functional Status Self care Ambulation Normal Comments CHRONIC BACK PAIN /  LEG PAIN SINCE PAIN  SHE FELL IN DEC. 010   Primary Care Provider:  [name] MD  CC:  BILATERAL LEG PAIN #9 / BACK PAIN #9   /  MEDICATON REFILL and .  History of Present Illness: 50 yr old woman with pmhx as described below comes to the clinic to discuss bilateral leg pain. Patient reports that she went to sport medicine visit about 3 1/2 weeks ago. Patient reports that she will go back to sport medicine in 2 weeks. She has not fallen recently. Continues to use cane. She has not been called for physical therapy yet. Patient reports her pain is well controlled with current pain regimen. She is taking hydromorphone three times a day. Patient has a follow up appointment with Dr. Meredith Pel on the January 12, 2010.   Depression History:      The patient denies a depressed mood most of the day and a diminished interest in her usual daily activities.         Preventive Screening-Counseling & Management  Alcohol-Tobacco     Smoking Status: quit     Smoking Cessation Counseling: yes     Year  Quit: 1997     Passive Smoke Exposure: no  Caffeine-Diet-Exercise     Does Patient Exercise: yes     Type of exercise: WALKING     Times/week: 5-7W  Problems Prior to Update: 1)  Knee Pain, Left  (ICD-719.46) 2)  Diabetes Mellitus, Type II  (ICD-250.00) 3)  Hypertension  (ICD-401.9) 4)  Hypothyroidism  (ICD-244.9) 5)  Hypercholesterolemia  (ICD-272.0) 6)  Low Back Pain  (ICD-724.2) 7)  Cough  (ICD-786.2) 8)  Irregular Menses  (ICD-626.4) 9)  Vaginal Bleeding  (ICD-623.8) 10)  Vaginitis, Atrophic  (ICD-627.3) 11)  Wart, Right Hand  (ICD-078.10) 12)  Gerd  (ICD-530.81) 13)  Cystitis, Chronic Interstitial  (ICD-595.1) 14)  Chest Pain Unspecified  (ICD-786.50) 15)  Hematochezia  (ICD-578.1) 16)  Urinary Frequency  (ICD-788.41) 17)  Disc Disease, Cervical  (ICD-722.4) 18)  Neck Pain, Acute  (ICD-723.1) 19)  Rash-nonvesicular  (ICD-782.1) 20)  Anemia Nos  (ICD-285.9) 21)  Transaminases, Serum, Elevated  (ICD-790.4) 22)  Constipation  (ICD-564.00) 23)  Intertrigo, Candidal  (ICD-695.89) 24)  Degenerative Disc Disease, Lumbar Spine  (ICD-722.52) 25)  Asthma Nos w/o Status Asthmaticus  (ICD-493.90) 26)  Anxiety Disorder  (ICD-300.00)  27)  Allergic Rhinitis  (ICD-477.9) 28)  Acne, Rosacea  (ICD-695.3) 29)  Obesity  (ICD-278.00)  Medications Prior to Update: 1)  Enalapril Maleate 10 Mg Tabs (Enalapril Maleate) .... Take 2 Tablets By Mouth Once A Day 2)  Albuterol 90 Mcg/act Aers (Albuterol) .... Inhale 2 Puffs Four Times A Day  As Needed 3)  Azmacort 100 Mcg/act Aers (Triamcinolone Acetonide(Inhal)) .... Inhale 2 Puffs Two Times A Day 4)  Xanax 1 Mg Tabs (Alprazolam) .... Take 1 Tablet By Mouth Four Times A Day For Anxiety 5)  Synthroid 75 Mcg Tabs (Levothyroxine Sodium) .... Take 1 Tablet By Mouth Once A Day 6)  Glucophage Xr 500 Mg  Tb24 (Metformin Hcl) .... Take 2 Tablets By Mouth Each Am and 1 Tablet Each Pm 7)  Lipitor 10 Mg Tabs (Atorvastatin Calcium) .... Take 1 Tablet By  Mouth Once A Day 8)  Nexium 40 Mg Cpdr (Esomeprazole Magnesium) .... Take 1 Capsule By Mouth Once A Day 9)  Aspirin 81 Mg Tbec (Aspirin) .... Take 1 Tablet By Mouth Once A Day 10)  Flonase 50 Mcg/act Susp (Fluticasone Propionate) .... Take 2 Sprays in Each Nostril Once A Day 11)  Metamucil  Powd (Psyllium Powd) .... Take 1 Tablespoonful in 8 Ounces of Water Once A Day To Three Times A Day As Needed For Constipation 12)  Glucotrol 5 Mg Tabs (Glipizide) .... Take 1 Tablet By Mouth Two Times A Day 13)  Colace 100 Mg  Caps (Docusate Sodium) .... Take 1 Capsule By Mouth Two Times A Day. 14)  Carisoprodol 350 Mg Tabs (Carisoprodol) .... Take 1 Tablet By Mouth Four Times A Day As Needed For Muscle Spasm 15)  Nystatin 100000 Unit/gm Powd (Nystatin) .... Apply To Affected Area Two Times A Day For 10 Days 16)  Hydrochlorothiazide 12.5 Mg Caps (Hydrochlorothiazide) .... Take 1 Capsule By Mouth Once A Day 17)  Tramadol Hcl 50 Mg Tabs (Tramadol Hcl) .... Take 1 Tablet By Mouth Every 6 Hours As Needed For Back Pain 18)  Fexofenadine Hcl 60 Mg Tabs (Fexofenadine Hcl) .... Take 1 Tablet By Mouth Two Times A Day For Allergies 19)  Gabapentin 300 Mg Caps (Gabapentin) .... Take 1 Capsule By Mouth Three Times A Day 20)  Erythromycin Ethylsuccinate 400 Mg Tabs (Erythromycin Ethylsuccinate) .... Take 1 Tablet By Mouth Two Times A Day 21)  Hydromorphone Hcl 2 Mg Tabs (Hydromorphone Hcl) .... Take 1 Tablet By Mouth Every 6 Hours As Needed For Pain 22)  Ferrous Sulfate 325 (65 Fe) Mg Tabs (Ferrous Sulfate) .... Take 1 Tablet By Mouth Three Times A Day  Current Medications (verified): 1)  Enalapril Maleate 10 Mg Tabs (Enalapril Maleate) .... Take 2 Tablets By Mouth Once A Day 2)  Albuterol 90 Mcg/act Aers (Albuterol) .... Inhale 2 Puffs Four Times A Day  As Needed 3)  Azmacort 100 Mcg/act Aers (Triamcinolone Acetonide(Inhal)) .... Inhale 2 Puffs Two Times A Day 4)  Xanax 1 Mg Tabs (Alprazolam) .... Take 1 Tablet By Mouth  Four Times A Day For Anxiety 5)  Synthroid 75 Mcg Tabs (Levothyroxine Sodium) .... Take 1 Tablet By Mouth Once A Day 6)  Glucophage Xr 500 Mg  Tb24 (Metformin Hcl) .... Take 2 Tablets By Mouth Each Am and 1 Tablet Each Pm 7)  Lipitor 10 Mg Tabs (Atorvastatin Calcium) .... Take 1 Tablet By Mouth Once A Day 8)  Nexium 40 Mg Cpdr (Esomeprazole Magnesium) .... Take 1 Capsule By Mouth Once A Day 9)  Aspirin 81  Mg Tbec (Aspirin) .... Take 1 Tablet By Mouth Once A Day 10)  Flonase 50 Mcg/act Susp (Fluticasone Propionate) .... Take 2 Sprays in Each Nostril Once A Day 11)  Metamucil  Powd (Psyllium Powd) .... Take 1 Tablespoonful in 8 Ounces of Water Once A Day To Three Times A Day As Needed For Constipation 12)  Glucotrol 5 Mg Tabs (Glipizide) .... Take 1 Tablet By Mouth Two Times A Day 13)  Colace 100 Mg  Caps (Docusate Sodium) .... Take 1 Capsule By Mouth Two Times A Day. 14)  Carisoprodol 350 Mg Tabs (Carisoprodol) .... Take 1 Tablet By Mouth Four Times A Day As Needed For Muscle Spasm 15)  Nystatin 100000 Unit/gm Powd (Nystatin) .... Apply To Affected Area Two Times A Day For 10 Days 16)  Hydrochlorothiazide 12.5 Mg Caps (Hydrochlorothiazide) .... Take 1 Capsule By Mouth Once A Day 17)  Tramadol Hcl 50 Mg Tabs (Tramadol Hcl) .... Take 1 Tablet By Mouth Every 6 Hours As Needed For Back Pain 18)  Fexofenadine Hcl 60 Mg Tabs (Fexofenadine Hcl) .... Take 1 Tablet By Mouth Two Times A Day For Allergies 19)  Gabapentin 300 Mg Caps (Gabapentin) .... Take 1 Capsule By Mouth Three Times A Day 20)  Erythromycin Ethylsuccinate 400 Mg Tabs (Erythromycin Ethylsuccinate) .... Take 1 Tablet By Mouth Two Times A Day 21)  Hydromorphone Hcl 2 Mg Tabs (Hydromorphone Hcl) .... Take 1 Tablet By Mouth Every 6 Hours As Needed For Pain 22)  Ferrous Sulfate 325 (65 Fe) Mg Tabs (Ferrous Sulfate) .... Take 1 Tablet By Mouth Three Times A Day  Allergies: 1)  ! Codeine Sulfate (Codeine Sulfate) 2)  ! Penicillin V Potassium  (Penicillin V Potassium) 3)  ! Tetracycline Hcl (Tetracycline Hcl) 4)  ! Minocycline Hcl (Minocycline Hcl) 5)  ! Darvocet-N 100  Past History:  Past Medical History: Last updated: 07/06/2006 Allergic rhinitis Anxiety disorder Asthma NOS w/o status asthmaticus Candidal intertrigo Carpal tunnel syndrome Degenerative disc disease Diabetes mellitus, type II Elevated liver enzymes GERD Hypertension Hypothyroidism Low back pain Obesity Rosacea  Past Surgical History: Last updated: 06/27/2006 Release of right transverse carpal ligament 05/08/2000 by Dr. Josephine Igo  Family History: Last updated: 08/05/2008 Father had MI at age 50. No breast or colon cancer.  Risk Factors: Exercise: yes (01/03/2010)  Risk Factors: Smoking Status: quit (01/03/2010) Passive Smoke Exposure: no (01/03/2010)  Family History: Reviewed history from 08/05/2008 and no changes required. Father had MI at age 69. No breast or colon cancer.  Social History: Reviewed history and no changes required.  Review of Systems       The patient complains of peripheral edema and difficulty walking.  The patient denies fever, chest pain, dyspnea on exertion, headaches, hemoptysis, abdominal pain, melena, hematochezia, and hematuria.    Physical Exam  General:  alert, in no distress Lungs:  normal respiratory effort, normal breath sounds, no crackles, and no wheezes.   Heart:  normal rate, regular rhythm, no murmur, no gallop, and no rub.   Abdomen:  soft, non-tender, normal bowel sounds, no hepatomegaly, and no splenomegaly.   Msk:  Left Knee: No edema, bruising or bony abnormalities. Terminal extension is painful but she is able to do it actively. Flexion to 90 degrees only before pain stops her. + TTP mainly along the medial joint line and along MCL but also along lateral joint line No significant pain or gapping with MCL testing. Normal LCL.  + McMurray's testing and flexion pinch Normal anterior  and  posterior drawer signs 4/5 strength with resisted knee flexion and extension  Right Knee: No bony abnormalities, edema or bruising. Full extension and fleixon without pain + TTP along the medial joint line Normal MCL and LCL. Normal anterior and posterior drawer signs 4/5 strength with resisted knee flexion and extension Neg McMurray's and flexion pinch  Back: No bony abnormalities or obvious scoliosis + TTP at L4-S1 No TTP in the SI joints Forward flexion to 75 degrees, pain with 10 degrees of extension, leans side to side with mild pain and rotates well with mild pain + SLR on the left but not on the right Difficulty walking on her heels and toes Decreased reflex on the right with patellar testing 4/5 strength of knee flexion and extension Decreased big toe strength on the right Extremities:  +1 lower extremity edema bilaterally Neurologic:  Nonfocal   Impression & Recommendations:  Problem # 1:  KNEE PAIN, LEFT (ICD-719.46) No change in physical exam. Patient is scheduled to return to clinic on January 12, 2010 to further discuss pain regimen with Dr. Meredith Pel. Refilled medication as to control pain until her appointment. Patient will follow up with Sports medicine in two weeks. She was instructed to call for referral to physical therapy. As noted did not refill carisoprodol because patient should have refills left.  Her updated medication list for this problem includes:    Aspirin 81 Mg Tbec (Aspirin) .Marland Kitchen... Take 1 tablet by mouth once a day    Carisoprodol 350 Mg Tabs (Carisoprodol) .Marland Kitchen... Take 1 tablet by mouth four times a day as needed for muscle spasm    Tramadol Hcl 50 Mg Tabs (Tramadol hcl) .Marland Kitchen... Take 1 tablet by mouth every 6 hours as needed for back pain    Hydromorphone Hcl 2 Mg Tabs (Hydromorphone hcl) .Marland Kitchen... Take 1 tablet by mouth every 6 hours as needed for pain  Her updated medication list for this problem includes:    Aspirin 81 Mg Tbec (Aspirin) .Marland Kitchen... Take 1  tablet by mouth once a day    Carisoprodol 350 Mg Tabs (Carisoprodol) .Marland Kitchen... Take 1 tablet by mouth four times a day as needed for muscle spasm    Tramadol Hcl 50 Mg Tabs (Tramadol hcl) .Marland Kitchen... Take 1 tablet by mouth every 6 hours as needed for back pain    Hydromorphone Hcl 2 Mg Tabs (Hydromorphone hcl) .Marland Kitchen... Take 1 tablet by mouth every 6 hours as needed for pain  Problem # 2:  HYPERTENSION (ICD-401.9) Continue current regimen and recheck BP on follow up.  Her updated medication list for this problem includes:    Enalapril Maleate 10 Mg Tabs (Enalapril maleate) .Marland Kitchen... Take 2 tablets by mouth once a day    Hydrochlorothiazide 12.5 Mg Caps (Hydrochlorothiazide) .Marland Kitchen... Take 1 capsule by mouth once a day  BP today: 153/87 Prior BP: 121/74 (12/15/2009)  Labs Reviewed: K+: 4.2 (11/24/2009) Creat: : 0.48 (11/24/2009)   Chol: 146 (11/24/2009)   HDL: 34 (11/24/2009)   LDL: 66 (11/24/2009)   TG: 231 (11/24/2009)  Her updated medication list for this problem includes:    Enalapril Maleate 10 Mg Tabs (Enalapril maleate) .Marland Kitchen... Take 2 tablets by mouth once a day    Hydrochlorothiazide 12.5 Mg Caps (Hydrochlorothiazide) .Marland Kitchen... Take 1 capsule by mouth once a day  Problem # 3:  ANEMIA, IRON DEFICIENCY (ICD-280.9) Instructed to complete hemoccult cards and return as directed. Patient taking Iron supplementation. As previously described CBC will be rechecked on follow up.  Her updated medication list  for this problem includes:    Ferrous Sulfate 325 (65 Fe) Mg Tabs (Ferrous sulfate) .Marland Kitchen... Take 1 tablet by mouth three times a day  Orders: T-Hemoccult Card-Single (60454)  Problem # 4:  DIABETES MELLITUS, TYPE II (ICD-250.00) At goal. Continue current regimen.  Her updated medication list for this problem includes:    Enalapril Maleate 10 Mg Tabs (Enalapril maleate) .Marland Kitchen... Take 2 tablets by mouth once a day    Glucophage Xr 500 Mg Tb24 (Metformin hcl) .Marland Kitchen... Take 2 tablets by mouth each am and 1 tablet each  pm    Aspirin 81 Mg Tbec (Aspirin) .Marland Kitchen... Take 1 tablet by mouth once a day    Glucotrol 5 Mg Tabs (Glipizide) .Marland Kitchen... Take 1 tablet by mouth two times a day  Labs Reviewed: Creat: 0.48 (11/24/2009)     Last Eye Exam: No diabetic retinopathy.   Exam by Dr. Ernesto Rutherford (12/10/2009) Reviewed HgBA1c results: 7.0 (11/24/2009)  6.6 (07/07/2009)  Complete Medication List: 1)  Enalapril Maleate 10 Mg Tabs (Enalapril maleate) .... Take 2 tablets by mouth once a day 2)  Albuterol 90 Mcg/act Aers (Albuterol) .... Inhale 2 puffs four times a day  as needed 3)  Azmacort 100 Mcg/act Aers (Triamcinolone acetonide(inhal)) .... Inhale 2 puffs two times a day 4)  Xanax 1 Mg Tabs (Alprazolam) .... Take 1 tablet by mouth four times a day for anxiety 5)  Synthroid 75 Mcg Tabs (Levothyroxine sodium) .... Take 1 tablet by mouth once a day 6)  Glucophage Xr 500 Mg Tb24 (Metformin hcl) .... Take 2 tablets by mouth each am and 1 tablet each pm 7)  Lipitor 10 Mg Tabs (Atorvastatin calcium) .... Take 1 tablet by mouth once a day 8)  Nexium 40 Mg Cpdr (Esomeprazole magnesium) .... Take 1 capsule by mouth once a day 9)  Aspirin 81 Mg Tbec (Aspirin) .... Take 1 tablet by mouth once a day 10)  Flonase 50 Mcg/act Susp (Fluticasone propionate) .... Take 2 sprays in each nostril once a day 11)  Metamucil Powd (Psyllium powd) .... Take 1 tablespoonful in 8 ounces of water once a day to three times a day as needed for constipation 12)  Glucotrol 5 Mg Tabs (Glipizide) .... Take 1 tablet by mouth two times a day 13)  Colace 100 Mg Caps (Docusate sodium) .... Take 1 capsule by mouth two times a day. 14)  Carisoprodol 350 Mg Tabs (Carisoprodol) .... Take 1 tablet by mouth four times a day as needed for muscle spasm 15)  Nystatin 100000 Unit/gm Powd (Nystatin) .... Apply to affected area two times a day for 10 days 16)  Hydrochlorothiazide 12.5 Mg Caps (Hydrochlorothiazide) .... Take 1 capsule by mouth once a day 17)  Tramadol  Hcl 50 Mg Tabs (Tramadol hcl) .... Take 1 tablet by mouth every 6 hours as needed for back pain 18)  Fexofenadine Hcl 60 Mg Tabs (Fexofenadine hcl) .... Take 1 tablet by mouth two times a day for allergies 19)  Gabapentin 300 Mg Caps (Gabapentin) .... Take 1 capsule by mouth three times a day 20)  Erythromycin Ethylsuccinate 400 Mg Tabs (Erythromycin ethylsuccinate) .... Take 1 tablet by mouth two times a day 21)  Hydromorphone Hcl 2 Mg Tabs (Hydromorphone hcl) .... Take 1 tablet by mouth every 6 hours as needed for pain 22)  Ferrous Sulfate 325 (65 Fe) Mg Tabs (Ferrous sulfate) .... Take 1 tablet by mouth three times a day  Patient Instructions: 1)  Please return for your appointment  with Dr. Meredith Pel on January 12, 2010. 2)  Take medication as directed. Prescriptions: HYDROMORPHONE HCL 2 MG TABS (HYDROMORPHONE HCL) Take 1 tablet by mouth every 6 hours as needed for pain  #30 x 0   Entered and Authorized by:   Laren Everts MD   Signed by:   Laren Everts MD on 01/03/2010   Method used:   Print then Give to Patient   RxID:   [id number]    Prevention & Chronic Care Immunizations   Influenza vaccine: Fluvax 3+  (11/24/2009)   Influenza vaccine due: 07/21/2009    Tetanus booster: 08/21/2007: Td    Pneumococcal vaccine: Not documented  Other Screening   Pap smear: Interpretation:Negative for intraepithelial lesions or malignancy.     (04/05/2007)    Mammogram: ASSESSMENT: Negative - BI-RADS 1  (05/04/2009)   Mammogram due: 04/2010   Smoking status: quit  (01/03/2010)  Diabetes Mellitus   HgbA1C: 7.0  (11/24/2009)   HgbA1C action/deferral: Ordered  (07/07/2009)   Hemoglobin A1C due: 07/03/2007    Eye exam: No diabetic retinopathy.   Exam by Dr. Ernesto Rutherford  (12/10/2009)   Eye exam due: 12/2010    Foot exam: yes  (04/21/2009)   Foot exam action/deferral: Refused   High risk foot: Yes  (07/07/2009)   Foot care education: Done  (07/07/2009)   Foot  exam due: 10/07/2009    Urine microalbumin/creatinine ratio: 9.2  (04/21/2009)   Urine microalbumin/cr due: 04/21/2010    Diabetes flowsheet reviewed?: Yes   Progress toward A1C goal: At goal  Lipids   Total Cholesterol: 146  (11/24/2009)   Lipid panel action/deferral: LDL Direct Ordered   LDL: 66  (11/24/2009)   LDL Direct: 101  (07/07/2009)   HDL: 34  (11/24/2009)   Triglycerides: 231  (11/24/2009)    SGOT (AST): 18  (11/24/2009)   SGPT (ALT): 14  (11/24/2009)   Alkaline phosphatase: 71  (11/24/2009)   Total bilirubin: 0.3  (11/24/2009)    Lipid flowsheet reviewed?: Yes   Progress toward LDL goal: At goal  Hypertension   Last Blood Pressure: 153 / 87  (01/03/2010)   Serum creatinine: 0.48  (11/24/2009)   Serum potassium 4.2  (11/24/2009)    Hypertension flowsheet reviewed?: Yes   Progress toward BP goal: Deteriorated  Self-Management Support :   Personal Goals (by the next clinic visit) :     Personal A1C goal: 7  (12/15/2009)     Personal blood pressure goal: 130/80  (12/15/2009)     Personal LDL goal: 100  (12/15/2009)    Patient will work on the following items until the next clinic visit to reach self-care goals:     Medications and monitoring: take my medicines every day, check my blood sugar, bring all of my medications to every visit, examine my feet every day  (01/03/2010)     Eating: drink diet soda or water instead of juice or soda, eat more vegetables, use fresh or frozen vegetables, eat foods that are low in salt, eat baked foods instead of fried foods, eat fruit for snacks and desserts, limit or avoid alcohol  (01/03/2010)     Home glucose monitoring frequency: 1 time daily  (07/07/2009)    Diabetes self-management support: Written self-care plan  (01/03/2010)   Diabetes care plan printed    Hypertension self-management support: Written self-care plan  (01/03/2010)   Hypertension self-care plan printed.    Lipid self-management support: Written  self-care plan  (01/03/2010)   Lipid self-care plan printed.

## 2010-12-22 NOTE — Progress Notes (Signed)
Summary: med refill/gp  Phone Note Refill Request Message from:  Patient on Mar 28, 2010 1:41 PM  Refills Requested: Medication #1:  HYDROMORPHONE HCL 2 MG TABS Take 1 tablet by mouth every 6 hours as needed for pain   Last Refilled: 03/02/2010 States she takes 3 tablets a day and  statesshe has an appt. with the orthopedicst  5/21. Pt # [id number]   Method Requested: Telephone to Pharmacy Initial call taken by: Chinita Pester RN,  Mar 28, 2010 1:41 PM  Follow-up for Phone Call        Prescription printed and signed - nurse to complete. Follow-up by: [name] MD,  Mar 29, 2010 5:42 PM  Additional Follow-up for Phone Call Additional follow up Details #1::        Pt infomred rx is ready Additional Follow-up by: Merrie Roof RN,  Mar 30, 2010 9:05 AM    Prescriptions: HYDROMORPHONE HCL 2 MG TABS (HYDROMORPHONE HCL) Take 1 tablet by mouth every 6 hours as needed for pain  #30 x 0   Entered and Authorized by:   [name] MD   Signed by:   [name] MD on 03/29/2010   Method used:   Print then Give to Patient   RxID:   [id number]

## 2010-12-22 NOTE — Progress Notes (Signed)
Summary: refill/ hla  Phone Note Refill Request Message from:  Patient on October 26, 2010 11:50 AM  Refills Requested: Medication #1:  HYDROMORPHONE HCL 2 MG TABS Take 1 tablet by mouth every 6 hours as needed for pain   Dosage confirmed as above?Dosage Confirmed   Supply Requested: 1 month   Last Refilled: 11/16  Medication #2:  TRAMADOL HCL 50 MG TABS Take 1/2 tablet by mouth every 6 hours as needed for back pain   Dosage confirmed as above?Dosage Confirmed   Supply Requested: 1 month last visit 11/16  Initial call taken by: Marin Roberts RN,  October 26, 2010 11:50 AM    Prescriptions: HYDROMORPHONE HCL 2 MG TABS (HYDROMORPHONE HCL) Take 1 tablet by mouth every 6 hours as needed for pain  #30 x 0   Entered and Authorized by:   Doneen Poisson MD   Signed by:   Doneen Poisson MD on 10/27/2010   Method used:   Print then Give to Patient   RxID:   [id number] TRAMADOL HCL 50 MG TABS (TRAMADOL HCL) Take 1/2 tablet by mouth every 6 hours as needed for back pain  #60 x 2   Entered and Authorized by:   Doneen Poisson MD   Signed by:   Doneen Poisson MD on 10/27/2010   Method used:   Electronically to        [facility]  [address] [id number] (retail)       [address]       [place], [place]  [address]       Ph: [phone]       Fax: [phone]   RxID:   [id number]

## 2010-12-22 NOTE — Letter (Signed)
Summary: Pharmacologist   Imported By: Florinda Marker 11/26/2009 15:27:49  _____________________________________________________________________  External Attachment:    Type:   Image     Comment:   External Document

## 2010-12-22 NOTE — Consult Note (Signed)
Summary: [facility]  [facility]   Imported By: Louretta Parma 11/17/2010 16:34:13  _____________________________________________________________________  External Attachment:    Type:   Image     Comment:   External Document

## 2010-12-22 NOTE — Miscellaneous (Signed)
  Clinical Lists Changes  Medications: Changed medication from ALBUTEROL 90 MCG/ACT AERS (ALBUTEROL) Inhale 2 puffs four times a day  as needed to VENTOLIN HFA 108 (90 BASE) MCG/ACT AERS (ALBUTEROL SULFATE) Inhale 2 puffs four times a day  as needed

## 2010-12-22 NOTE — Progress Notes (Signed)
Summary: Call from patient  Phone Note Call from Patient   Caller: Patient Summary of Call: Patient returned call, and I informed her of the result of her lipid panel. I discussed with her the plan to start Crestor 5 mg daily, and advised her to let me know if she has any problems with that medication. Initial call taken by: [name] MD,  October 07, 2010 3:16 PM

## 2010-12-22 NOTE — Consult Note (Signed)
Summary: [facility]  [facility]   Imported By: Louretta Parma 10/20/2010 10:07:13  _____________________________________________________________________  External Attachment:    Type:   Image     Comment:   External Document

## 2010-12-22 NOTE — Progress Notes (Signed)
Summary: out of test strips/dmr  Phone Note Call from Patient   Caller: Patient Summary of Call: Patient called to see if Dr. Meredith Pel approved increased testing because she is out of strips since saturday. would like to be able to test more frequently. Also mentioned she is thinking about scheduling an appointment with RD, CDE for weight loss.    Initial call taken by: Jamison Neighbor RD,CDE,  May 16, 2010 4:49 PM  Follow-up for Phone Call        Patient's diabetes is well controlled; unless she is having problems with low blood sugars, I don't think increased testing frequency is needed. Follow-up by: [name] MD,  May 24, 2010 2:36 PM  Additional Follow-up for Phone Call Additional follow up Details #1::        I think patient said she'd been testing more often since her problems with dizziness.  Additional Follow-up by: Jamison Neighbor RD,CDE,  May 24, 2010 4:31 PM    Additional Follow-up for Phone Call Additional follow up Details #2::    Discussed increased self monitoring with Dr. Meredith Pel: he does not think dizziness is related to blood sugar and will approve twice a day testing for a month to be sure. I called [facility] where patient gets supplies; they cannot sen dout more than 1x a day unless they get proof from patient that she is testing 2x a day and documentation of medical necessity from our office. Follow-up by: Jamison Neighbor RD,CDE,  May 30, 2010 2:25 PM  Additional Follow-up for Phone Call Additional follow up Details #3:: Details for Additional Follow-up Action Taken: called [name] and discussed above.  She agreed to wait until she sees Dr. Meredith Pel on 7/18 and brings meter to pursue additional testing. Additional Follow-up by: Jamison Neighbor RD,CDE,  May 30, 2010 5:14 PM  New/Updated Medications: ACCU-CHEK COMPACT TEST DRUM  STRP (GLUCOSE BLOOD) use to check blood sugar one time a day

## 2010-12-22 NOTE — Progress Notes (Signed)
Summary: refill/ hla  Phone Note Call from Patient   Summary of Call: pt called c/o increased back pain, offered appts, refused until tues 1/10 at 0815. requests pain med refill, entered request, informed pt that dr Meredith Pel has 48 hrs to respond to refill and she will be notified as soon as dr responds, she states she wants it now, i explained the office policy and that dr Meredith Pel was not in the office today, she asked my name, i gave it to her and told her i would call as soon as i heard from the request. dr Meredith Pel informed Initial call taken by: Marin Roberts RN,  November 22, 2010 12:32 PM

## 2010-12-22 NOTE — Medication Information (Signed)
Summary: Tax adviser   Imported By: Florinda Marker 12/14/2009 16:13:04  _____________________________________________________________________  External Attachment:    Type:   Image     Comment:   External Document

## 2010-12-22 NOTE — Miscellaneous (Signed)
Summary: Doctor Diabetic Supply: [facility]osis- Denied  Doctor Diabetic Supply: [facility]- Denied   Imported By: Florinda Marker 01/17/2010 16:58:18  _____________________________________________________________________  External Attachment:    Type:   Image     Comment:   External Document

## 2010-12-22 NOTE — Assessment & Plan Note (Signed)
Summary: 2WK F/U/EST/VS   Vital Signs:  Patient profile:   50 year old female Height:      62 inches Weight:      237.0 pounds BMI:     43.50 Temp:     97.0 degrees F oral Pulse rate:   90 / minute BP sitting:   121 / 74  (right arm)  Vitals Entered By: Filomena Jungling NT II (December 15, 2009 9:31 AM) CC: 2 weeek follow-up visit Is Patient Diabetic? Yes Did you bring your meter with you today? No Pain Assessment Patient in pain? yes     Location: back and leg pain Intensity: 8 Type: aching Nutritional Status BMI of > 30 = obese  Have you ever been in a relationship where you felt threatened, hurt or afraid?No   Does patient need assistance? Functional Status Self care Ambulation Normal, Wheelchair Comments has leg  brace   Primary Care Provider:  [name] MD  CC:  2 weeek follow-up visit.  History of Present Illness: Patient returns for follow-up of her left knee pain, diabetes, hypertension, and other medical problems.  She reports definite improvement in her left knee pain following her visit to the sports medicine clinic; she has been wearing the flexible knee brace that they provided for her.  She has not yet received an appointment in physical therapy but says that this is being arranged by sports medicine.  Her knee pain is reasonably well controlled on her current dose of hydromorphone.  She reports that she is compliant with her medications.  Preventive Screening-Counseling & Management  Alcohol-Tobacco     Smoking Status: quit     Smoking Cessation Counseling: yes     Year Quit: 1997     Passive Smoke Exposure: no  Caffeine-Diet-Exercise     Does Patient Exercise: yes     Type of exercise: WALKING     Times/week: 5-7W  Current Medications (verified): 1)  Enalapril Maleate 10 Mg Tabs (Enalapril Maleate) .... Take 2 Tablets By Mouth Once A Day 2)  Albuterol 90 Mcg/act Aers (Albuterol) .... Inhale 2 Puffs Four Times A Day  As Needed 3)  Azmacort 100  Mcg/act Aers (Triamcinolone Acetonide(Inhal)) .... Inhale 2 Puffs Two Times A Day 4)  Xanax 1 Mg Tabs (Alprazolam) .... Take 1 Tablet By Mouth Four Times A Day For Anxiety 5)  Synthroid 75 Mcg Tabs (Levothyroxine Sodium) .... Take 1 Tablet By Mouth Once A Day 6)  Glucophage Xr 500 Mg  Tb24 (Metformin Hcl) .... Take 2 Tablets By Mouth Each Am and 1 Tablet Each Pm 7)  Lipitor 10 Mg Tabs (Atorvastatin Calcium) .... Take 1 Tablet By Mouth Once A Day 8)  Nexium 40 Mg Cpdr (Esomeprazole Magnesium) .... Take 1 Capsule By Mouth Once A Day 9)  Aspirin 81 Mg Tbec (Aspirin) .... Take 1 Tablet By Mouth Once A Day 10)  Flonase 50 Mcg/act Susp (Fluticasone Propionate) .... Take 2 Sprays in Each Nostril Once A Day 11)  Metamucil  Powd (Psyllium Powd) .... Take 1 Tablespoonful in 8 Ounces of Water Once A Day To Three Times A Day As Needed For Constipation 12)  Glucotrol 5 Mg Tabs (Glipizide) .... Take 1 Tablet By Mouth Two Times A Day 13)  Colace 100 Mg  Caps (Docusate Sodium) .... Take 1 Capsule By Mouth Two Times A Day. 14)  Carisoprodol 350 Mg Tabs (Carisoprodol) .... Take 1 Tablet By Mouth Four Times A Day As Needed For Muscle Spasm 15)  Nystatin 100000 Unit/gm Powd (Nystatin) .... Apply To Affected Area Two Times A Day For 10 Days 16)  Hydrochlorothiazide 12.5 Mg Caps (Hydrochlorothiazide) .... Take 1 Capsule By Mouth Once A Day 17)  Tramadol Hcl 50 Mg Tabs (Tramadol Hcl) .... Take 1 Tablet By Mouth Every 6 Hours As Needed For Back Pain 18)  Fexofenadine Hcl 60 Mg Tabs (Fexofenadine Hcl) .... Take 1 Tablet By Mouth Two Times A Day For Allergies 19)  Gabapentin 300 Mg Caps (Gabapentin) .... Take 1 Capsule By Mouth Three Times A Day 20)  Erythromycin Ethylsuccinate 400 Mg Tabs (Erythromycin Ethylsuccinate) .... Take 1 Tablet By Mouth Two Times A Day 21)  Hydromorphone Hcl 2 Mg Tabs (Hydromorphone Hcl) .... Take 1 Tablet By Mouth Every 6 Hours As Needed For Pain  Allergies (verified): 1)  ! Codeine Sulfate  (Codeine Sulfate) 2)  ! Penicillin V Potassium (Penicillin V Potassium) 3)  ! Tetracycline Hcl (Tetracycline Hcl) 4)  ! Minocycline Hcl (Minocycline Hcl) 5)  ! Darvocet-N 100 (Propoxyphene N-Apap)  Physical Exam  General:  alert, in no distress Lungs:  normal respiratory effort, normal breath sounds, no crackles, and no wheezes.   Heart:  normal rate, regular rhythm, no murmur, no gallop, and no rub.   Extremities:  left knee swelling/tenderness   Impression & Recommendations:  Problem # 1:  KNEE PAIN, LEFT (ICD-719.46) Patient's knee pain is improving with conservative therapy as provided by sports medicine.  The plan for now is to continued hydromorphone as a p.r.n. analgesic.  I advised her to use this medication only if she is having significant pain.  She will follow up with sports medicine as advised, and also with physical therapy.  I advised her that if she is not yet received a call from them by next week, to call and ask about the status of the referral.  Her updated medication list for this problem includes:    Aspirin 81 Mg Tbec (Aspirin) .Marland Kitchen... Take 1 tablet by mouth once a day    Carisoprodol 350 Mg Tabs (Carisoprodol) .Marland Kitchen... Take 1 tablet by mouth four times a day as needed for muscle spasm    Tramadol Hcl 50 Mg Tabs (Tramadol hcl) .Marland Kitchen... Take 1 tablet by mouth every 6 hours as needed for back pain    Hydromorphone Hcl 2 Mg Tabs (Hydromorphone hcl) .Marland Kitchen... Take 1 tablet by mouth every 6 hours as needed for pain  Problem # 2:  DIABETES MELLITUS, TYPE II (ICD-250.00) Patient's diabetes mellitus is well controlled on current regimen.  Plan is to continue current medications, and continue home capillary blood glucose monitoring.   Her updated medication list for this problem includes:    Enalapril Maleate 10 Mg Tabs (Enalapril maleate) .Marland Kitchen... Take 2 tablets by mouth once a day    Glucophage Xr 500 Mg Tb24 (Metformin hcl) .Marland Kitchen... Take 2 tablets by mouth each am and 1 tablet each pm     Aspirin 81 Mg Tbec (Aspirin) .Marland Kitchen... Take 1 tablet by mouth once a day    Glucotrol 5 Mg Tabs (Glipizide) .Marland Kitchen... Take 1 tablet by mouth two times a day  Labs Reviewed: Creat: 0.48 (11/24/2009)     Last Eye Exam: No diabetic retinopathy.   Visual acuity OD (best corrected):    20/25  Visual acuity OS (best corrected):     20/30 Intraocular pressure OD:     18 Intraocular pressure OS:     17 Exam by Molly Maduro L. Dione Booze, M.D.  (02/04/2008) Reviewed HgBA1c  results: 7.0 (11/24/2009)  6.6 (07/07/2009)  Problem # 3:  HYPERTENSION (ICD-401.9) Patient's blood pressure is well controlled on current regimen.  Plan is to continue current antihypertensive medications, and follow up blood pressure at next visit.   Her updated medication list for this problem includes:    Enalapril Maleate 10 Mg Tabs (Enalapril maleate) .Marland Kitchen... Take 2 tablets by mouth once a day    Hydrochlorothiazide 12.5 Mg Caps (Hydrochlorothiazide) .Marland Kitchen... Take 1 capsule by mouth once a day  BP today: 121/74 Prior BP: 117/77 (12/09/2009)  Labs Reviewed: K+: 4.2 (11/24/2009) Creat: : 0.48 (11/24/2009)   Chol: 146 (11/24/2009)   HDL: 34 (11/24/2009)   LDL: 66 (11/24/2009)   TG: 231 (11/24/2009)  Problem # 4:  HYPOTHYROIDISM (ICD-244.9) Patient's TSH was acceptable on January 5; will continue current dose of levothyroxine.  Her updated medication list for this problem includes:    Synthroid 75 Mcg Tabs (Levothyroxine sodium) .Marland Kitchen... Take 1 tablet by mouth once a day  Labs Reviewed: TSH: 4.035 (11/24/2009)    HgBA1c: 7.0 (11/24/2009) Chol: 146 (11/24/2009)   HDL: 34 (11/24/2009)   LDL: 66 (11/24/2009)   TG: 231 (11/24/2009)  Problem # 5:  HYPERCHOLESTEROLEMIA (ICD-272.0) Patient has good control of her LDL.  Lipitor is currently on hold because of the potential serious interaction with her erythromycin, which she is taking for her rosacea.  The erythromycin is prescribed by her dermatologist Dr. Terri Piedra.  I advised her to talk with Dr.  Terri Piedra about the planned duration of erythromycin and about the potential interaction.  If she needs to be on erythromycin long-term, then we will have to choose an alternative medication for management of her hyperlipidemia.  Another option might a different antibiotic that would not interact with the statin.  Her updated medication list for this problem includes:    Lipitor 10 Mg Tabs (Atorvastatin calcium) .Marland Kitchen... Take 1 tablet by mouth once a day  Labs Reviewed: SGOT: 18 (11/24/2009)   SGPT: 14 (11/24/2009)   HDL:34 (11/24/2009), 39 (07/07/2009)  LDL:66 (11/24/2009), 86 (07/07/2009)  Chol:146 (11/24/2009), 173 (07/07/2009)  Trig:231 (11/24/2009), 239 (07/07/2009)  Problem # 6:  ANEMIA NOS (ICD-285.9) The patient's hemoglobin was 10.2 on January 5, somewhat lower than her baseline.  The plan is to workup with labs as below and with Hemoccult testing.  Orders: T-CBC w/Diff [phone]) T-Ferritin [phone]) T-Iron [phone]) T-Iron Binding Capacity (TIBC) ([id number]) T-Reticulocyte Count, Manual (52841) T-Vitamin B12 [phone]) T-Folic Acid; RBC ([id number]) T-Hemoccult Cards-Multiple (82270)  Hgb: 10.2 (11/24/2009)   Hct: 31.3 (11/24/2009)   Platelets: 172 (11/24/2009) RBC: 3.52 (11/24/2009)   RDW: 14.8 (11/24/2009)   WBC: 7.3 (11/24/2009) MCV: 88.9 (11/24/2009)   MCHC: 32.6 (11/24/2009) TSH: 4.035 (11/24/2009)  Complete Medication List: 1)  Enalapril Maleate 10 Mg Tabs (Enalapril maleate) .... Take 2 tablets by mouth once a day 2)  Albuterol 90 Mcg/act Aers (Albuterol) .... Inhale 2 puffs four times a day  as needed 3)  Azmacort 100 Mcg/act Aers (Triamcinolone acetonide(inhal)) .... Inhale 2 puffs two times a day 4)  Xanax 1 Mg Tabs (Alprazolam) .... Take 1 tablet by mouth four times a day for anxiety 5)  Synthroid 75 Mcg Tabs (Levothyroxine sodium) .... Take 1 tablet by mouth once a day 6)  Glucophage Xr 500 Mg Tb24 (Metformin hcl) .... Take 2 tablets by mouth each am  and 1 tablet each pm 7)  Lipitor 10 Mg Tabs (Atorvastatin calcium) .... Take 1 tablet by mouth once a day 8)  Nexium 40 Mg  Cpdr (Esomeprazole magnesium) .... Take 1 capsule by mouth once a day 9)  Aspirin 81 Mg Tbec (Aspirin) .... Take 1 tablet by mouth once a day 10)  Flonase 50 Mcg/act Susp (Fluticasone propionate) .... Take 2 sprays in each nostril once a day 11)  Metamucil Powd (Psyllium powd) .... Take 1 tablespoonful in 8 ounces of water once a day to three times a day as needed for constipation 12)  Glucotrol 5 Mg Tabs (Glipizide) .... Take 1 tablet by mouth two times a day 13)  Colace 100 Mg Caps (Docusate sodium) .... Take 1 capsule by mouth two times a day. 14)  Carisoprodol 350 Mg Tabs (Carisoprodol) .... Take 1 tablet by mouth four times a day as needed for muscle spasm 15)  Nystatin 100000 Unit/gm Powd (Nystatin) .... Apply to affected area two times a day for 10 days 16)  Hydrochlorothiazide 12.5 Mg Caps (Hydrochlorothiazide) .... Take 1 capsule by mouth once a day 17)  Tramadol Hcl 50 Mg Tabs (Tramadol hcl) .... Take 1 tablet by mouth every 6 hours as needed for back pain 18)  Fexofenadine Hcl 60 Mg Tabs (Fexofenadine hcl) .... Take 1 tablet by mouth two times a day for allergies 19)  Gabapentin 300 Mg Caps (Gabapentin) .... Take 1 capsule by mouth three times a day 20)  Erythromycin Ethylsuccinate 400 Mg Tabs (Erythromycin ethylsuccinate) .... Take 1 tablet by mouth two times a day 21)  Hydromorphone Hcl 2 Mg Tabs (Hydromorphone hcl) .... Take 1 tablet by mouth every 6 hours as needed for pain  Patient Instructions: 1)  Please schedule a follow-up appointment in 6 weeks.  Prescriptions: TRAMADOL HCL 50 MG TABS (TRAMADOL HCL) Take 1 tablet by mouth every 6 hours as needed for back pain  #30 Tablet x 3   Entered and Authorized by:   [name] MD   Signed by:   [name] MD on 12/15/2009   Method used:   Electronically to        [facility]  [address] [phone][facility] (retail)        [address]       [place], [place]  [address]       Ph: [phone]       Fax: [phone]   RxID:   [id number] GLUCOPHAGE XR 500 MG  TB24 (METFORMIN HCL) Take 2 tablets by mouth each AM and 1 tablet each PM  #90 x 11   Entered and Authorized by:   [name] MD   Signed by:   [name] MD on 12/15/2009   Method used:   Electronically to        [facility]  [address] [phone]* (retail)       [address]       [place], [place]  [address]       Ph: [phone]       Fax: [phone]   RxID:   [id number] SYNTHROID 75 MCG TABS (LEVOTHYROXINE SODIUM) Take 1 tablet by mouth once a day  #31 x 6   Entered and Authorized by:   [name] MD   Signed by:   [name] MD on 12/15/2009   Method used:   Electronically to        [facility]  [address] [phone]* (retail)       [address] [facility]       [place], [place]  [address]       Ph: [phone]       Fax: [phone]   RxID:   [id number] CARISOPRODOL 350 MG TABS (CARISOPRODOL) Take 1 tablet by mouth four times a day as needed for muscle spasm  #90 Tablet x 3   Entered and Authorized by:   [name] MD   Signed by:   [name] MD on 12/15/2009   Method used:   [facility]ronically to        [facility]  [address] [phone]* (retail)       [address]       [place], [place]  [address]       Ph: [phone]       Fax: [phone]   RxID:   [id number] ALBUTEROL 90 MCG/ACT AERS (ALBUTEROL) Inhale 2 puffs four times a day  as needed  #1 x 11   Entered and Authorized by:   [name] MD   Signed by:   [name] MD on 12/15/2009   Method used:   Electronically to        [facility]  [address] [phone]* (retail)       [address]       [place], [place]  [address]       Ph: [phone]       Fax: [phone]   RxID:   [id number] FGHWEXHB 100 MCG/ACT AERS (TRIAMCINOLONE ACETONIDE(INHAL)) Inhale 2 puffs two times  a day  #1 x 11   Entered and Authorized by:   [name] MD   Signed by:   [name] MD on 12/15/2009   Method used:   [facility]ron[facility] to        [facility]  [address] [phone]* (retail)       [address]       [place], [place]  [address]       Ph: [phone]       Fax: [phone]   RxID:   [id number]   Prevention & Chronic Care Immunizations   Influenza vaccine: Fluvax 3+  (11/24/2009)   Influenza vaccine due: 07/21/2009    Tetanus booster: 08/21/2007: Td    Pneumococcal vaccine: Not documented  Other Screening   Pap smear: Interpretation:Negative for intraepithelial lesions or malignancy.     (04/05/2007)    Mammogram: ASSESSMENT: Negative - BI-RADS 1  (05/04/2009)   Mammogram due: 04/2010  Reports requested:   Last Pap report requested.  Smoking status: quit  (12/15/2009)  Diabetes Mellitus   HgbA1C: 7.0  (11/24/2009)   HgbA1C action/deferral: Ordered  (07/07/2009)   Hemoglobin A1C due: 07/03/2007    Eye exam: No diabetic retinopathy.   Visual acuity OD (best corrected):    20/25  Visual acuity OS (best corrected):     20/30 Intraocular pressure OD:     18 Intraocular pressure OS:     17 Exam by Molly Maduro L. Dione Booze, M.D.   (02/04/2008)   Eye exam due: 02/2009    Foot exam: yes  (04/21/2009)   Foot exam action/deferral: Refused   High risk foot: Yes  (07/07/2009)   Foot care education: Done  (07/07/2009)   Foot exam due: 10/07/2009    Urine microalbumin/creatinine ratio: 9.2  (04/21/2009)   Urine microalbumin/cr due: 04/21/2010    Diabetes flowsheet reviewed?: Yes   Progress toward A1C goal:  At goal   Diabetes comments: Saw Dr. Dione Booze for eye exam last Friday.  Lipids   Total Cholesterol: 146  (11/24/2009)   Lipid panel action/deferral: LDL Direct Ordered   LDL: 66  (11/24/2009)   LDL Direct: 101  (07/07/2009)   HDL: 34  (11/24/2009)   Triglycerides: 231  (11/24/2009)    SGOT (AST): 18  (11/24/2009)   SGPT (ALT): 14   (11/24/2009)   Alkaline phosphatase: 71  (11/24/2009)   Total bilirubin: 0.3  (11/24/2009)    Lipid flowsheet reviewed?: Yes   Progress toward LDL goal: At goal  Hypertension   Last Blood Pressure: 121 / 74  (12/15/2009)   Serum creatinine: 0.48  (11/24/2009)   Serum potassium 4.2  (11/24/2009)    Hypertension flowsheet reviewed?: Yes   Progress toward BP goal: At goal  Self-Management Support :   Personal Goals (by the next clinic visit) :     Personal A1C goal: 7  (12/15/2009)     Personal blood pressure goal: 130/80  (12/15/2009)     Personal LDL goal: 100  (12/15/2009)    Patient will work on the following items until the next clinic visit to reach self-care goals:     Medications and monitoring: take my medicines every day, check my blood sugar  (12/15/2009)     Eating: drink diet soda or water instead of juice or soda, eat more vegetables, use fresh or frozen vegetables, eat foods that are low in salt, eat baked foods instead of fried foods  (12/15/2009)     Home glucose monitoring frequency: 1 time daily  (07/07/2009)    Diabetes self-management support: Written self-care plan  (12/15/2009)   Diabetes care plan printed    Hypertension self-management support: Written self-care plan  (12/15/2009)   Hypertension self-care plan printed.    Lipid self-management support: Written self-care plan  (12/15/2009)   Lipid self-care plan printed.   Nursing Instructions: Request report of last Pap    Process Orders Check Orders Results:     [facility]: Order checked:      -- T-Hemoccult Cards-Multiple -- No CPT codes found (CPT: )      -- T-Hemoccult Cards-Multiple --  NO TEST CODE (CPT: ) Tests Sent for requisitioning (December 16, 2009 8:00 PM):     12/15/2009: [facility] -- T-CBC w/Diff [[id number]] (signed)     12/15/2009: [facility] -- T-Ferritin [phone] (signed)     12/15/2009: [facility] --  Augusto Gamble [[id number]] (signed)     12/15/2009: [facility] -- T-Iron Binding Capacity (TIBC) [[id number]] (signed)     12/15/2009: [facility] -- T-Reticulocyte Count, Manual [[id number]] (signed)     12/15/2009: [facility] -- T-Vitamin B12 [[id number]] (signed)     12/15/2009: [facility] -- T-Folic Acid; RBC [[id number]] (signed)     12/15/2009: [facility] -- T-Hemoccult Cards-Multiple [[id number]] (signed)

## 2010-12-22 NOTE — Progress Notes (Signed)
Summary: lo cbg's/ hla  Phone Note Call from Patient   Summary of Call: pt calls to say on 4/19 at 2100 her cbg was 69, she had orange juice and pnbutter on graham crackers, do you want to change her meds? her ph# [phone] Initial call taken by: Marin Roberts RN,  March 09, 2010 4:56 PM  Follow-up for Phone Call        Please have patient stop the PM dose of glipizide.  If she has any further values below 80, have her stop the glipizide. Follow-up by: [name] MD,  March 10, 2010 2:58 PM  Additional Follow-up for Phone Call Additional follow up Details #1::        Patient called back on April 21 and I gave her the instructions as noted above. Additional Follow-up by: [name] MD,  March 11, 2010 4:50 PM    New/Updated Medications: GLUCOTROL 5 MG TABS (GLIPIZIDE) Take 1/2 tablet by mouth once daily in the AM

## 2010-12-22 NOTE — Letter (Signed)
Summary: [place] [facility]: Handicapped Placard   [place] [facility]: Handicapped Placard   Imported By: Florinda Marker 12/23/2009 09:52:33  _____________________________________________________________________  External Attachment:    Type:   Image     Comment:   External Document

## 2010-12-22 NOTE — Medication Information (Signed)
Summary: HYDROMORPHONE  HYDROMORPHONE   Imported By: Margie Billet 03/30/2010 15:13:36  _____________________________________________________________________  External Attachment:    Type:   Image     Comment:   External Document

## 2010-12-22 NOTE — Medication Information (Signed)
Summary: [facility]   Imported By: Gentry Fitz 05/17/2010 14:10:06  _____________________________________________________________________  External Attachment:    Type:   Image     Comment:   External Document

## 2010-12-22 NOTE — Letter (Signed)
Summary: ACCU-CHEK LOG BOOK 04-12-2010-05-11-2010  ACCU-CHEK LOG BOOK 04-12-2010-05-11-2010   Imported By: Margie Billet 05/26/2010 10:58:22  _____________________________________________________________________  External Attachment:    Type:   Image     Comment:   External Document

## 2010-12-22 NOTE — Assessment & Plan Note (Signed)
Summary: F/U/EST/VS   Vital Signs:  Patient profile:   50 year old female Height:      62 inches Weight:      219.8 pounds BMI:     40.35 Temp:     97.0 degrees F oral Pulse rate:   86 / minute BP sitting:   105 / 71  (right arm)  Vitals Entered By: Filomena Jungling NT II (March 02, 2010 10:55 AM) Is Patient Diabetic? Yes Nutritional Status BMI of > 30 = obese  Does patient need assistance? Functional Status Self care Ambulation Impaired:Risk for fall, Wheelchair Comments HAS CANE   Primary Care Provider:  [name] MD   History of Present Illness: Patient returns for followup of her diabetes mellitus, hypertension, left knee pain, and other medical problems.  She reports persistent left knee pain and stiffness without significant improvement.  She is using tramadol for pain and hydromorphone on a p.r.n. basis.  She has had an occasional blood sugar as low as 60 with some associated symptoms, but in general she reports that her blood sugars have been doing well. She just got a new glucose meter but has not yet started using it.  She reports that she saw Dr. Ewing Schlein yesterday for evaluation of Hemoccult-positive stool, and he plans to do upper endoscopy and colonoscopy.  She reports that she is compliant with her medications.  Preventive Screening-Counseling & Management  Alcohol-Tobacco     Smoking Status: quit     Smoking Cessation Counseling: yes     Year Quit: 1997     Passive Smoke Exposure: no  Caffeine-Diet-Exercise     Does Patient Exercise: yes     Type of exercise: WALKING     Times/week: 5-7W  Current Medications (verified): 1)  Enalapril Maleate 10 Mg Tabs (Enalapril Maleate) .... Take 2 Tablets By Mouth Once A Day 2)  Albuterol 90 Mcg/act Aers (Albuterol) .... Inhale 2 Puffs Four Times A Day  As Needed 3)  Azmacort 100 Mcg/act Aers (Triamcinolone Acetonide(Inhal)) .... Inhale 2 Puffs Two Times A Day 4)  Xanax 1 Mg Tabs (Alprazolam) .... Take 1 Tablet By  Mouth Four Times A Day For Anxiety 5)  Synthroid 75 Mcg Tabs (Levothyroxine Sodium) .... Take 1 Tablet By Mouth Once A Day 6)  Glucophage Xr 500 Mg  Tb24 (Metformin Hcl) .... Take 2 Tablets By Mouth Each Am and 1 Tablet Each Pm 7)  Lipitor 10 Mg Tabs (Atorvastatin Calcium) .... Take 1 Tablet By Mouth Once A Day 8)  Nexium 40 Mg Cpdr (Esomeprazole Magnesium) .... Take 1 Capsule By Mouth Once A Day 9)  Aspirin 81 Mg Tbec (Aspirin) .... Take 1 Tablet By Mouth Once A Day 10)  Flonase 50 Mcg/act Susp (Fluticasone Propionate) .... Take 2 Sprays in Each Nostril Once A Day 11)  Metamucil  Powd (Psyllium Powd) .... Take 1 Tablespoonful in 8 Ounces of Water Once A Day To Three Times A Day As Needed For Constipation 12)  Glucotrol 5 Mg Tabs (Glipizide) .... Take 1/2 Tablet By Mouth Two Times A Day 13)  Colace 100 Mg  Caps (Docusate Sodium) .... Take 1 Capsule By Mouth Two Times A Day. 14)  Carisoprodol 350 Mg Tabs (Carisoprodol) .... Take 1 Tablet By Mouth Four Times A Day As Needed For Muscle Spasm 15)  Nystatin 100000 Unit/gm Powd (Nystatin) .... Apply To Affected Area Two Times A Day For 10 Days 16)  Hydrochlorothiazide 12.5 Mg Caps (Hydrochlorothiazide) .... Take 1 Capsule By  Mouth Once A Day 17)  Tramadol Hcl 50 Mg Tabs (Tramadol Hcl) .... Take 1 Tablet By Mouth Every 6 Hours As Needed For Back Pain 18)  Fexofenadine Hcl 60 Mg Tabs (Fexofenadine Hcl) .... Take 1 Tablet By Mouth Two Times A Day For Allergies 19)  Gabapentin 300 Mg Caps (Gabapentin) .... Take 1 Capsule By Mouth Four Times A Day 20)  Erythromycin Ethylsuccinate 400 Mg Tabs (Erythromycin Ethylsuccinate) .... Take 1 Tablet By Mouth Two Times A Day 21)  Hydromorphone Hcl 2 Mg Tabs (Hydromorphone Hcl) .... Take 1 Tablet By Mouth Every 6 Hours As Needed For Pain 22)  Ferrous Sulfate 325 (65 Fe) Mg Tabs (Ferrous Sulfate) .... Take 1 Tablet By Mouth Three Times A Day  Allergies (verified): 1)  ! Codeine Sulfate (Codeine Sulfate) 2)  !  Penicillin V Potassium (Penicillin V Potassium) 3)  ! Tetracycline Hcl (Tetracycline Hcl) 4)  ! Minocycline Hcl (Minocycline Hcl) 5)  ! Darvocet-N 100  Physical Exam  General:  alert Lungs:  normal respiratory effort, normal breath sounds, no crackles, and no wheezes.   Heart:  normal rate, regular rhythm, no murmur, no gallop, and no rub.   Extremities:  no edema   Impression & Recommendations:  Problem # 1:  DIABETES MELLITUS, TYPE II (ICD-250.00) Patient's diabetes mellitus is well controlled on current regimen.  I discussed with her the option of stopping the glipizide because of the occasional blood sugar of 60 that she has noted since her last visit. She has had no blood sugars lower than that. Given the improvement in her hemoglobin A1c, the decision was to continue her current regimen but if she has any more episodes of hypoglycemia that she will stop the glipizide and continue metformin alone.  Her updated medication list for this problem includes:    Enalapril Maleate 10 Mg Tabs (Enalapril maleate) .Marland Kitchen... Take 2 tablets by mouth once a day    Glucophage Xr 500 Mg Tb24 (Metformin hcl) .Marland Kitchen... Take 2 tablets by mouth each am and 1 tablet each pm    Aspirin 81 Mg Tbec (Aspirin) .Marland Kitchen... Take 1 tablet by mouth once a day    Glucotrol 5 Mg Tabs (Glipizide) .Marland Kitchen... Take 1/2 tablet by mouth two times a day  Labs Reviewed: Creat: 0.48 (11/24/2009)     Last Eye Exam: No diabetic retinopathy.   Exam by Dr. Ernesto Rutherford (12/10/2009) Reviewed HgBA1c results: 6.3 (03/02/2010)  7.0 (11/24/2009)  Orders: T- Capillary Blood Glucose (82948) T-Hgb A1C (in-house) (84166AY) T-Comprehensive Metabolic Panel (30160-10932)  Problem # 2:  HYPERTENSION (ICD-401.9) Patient's blood pressure is well controlled on current regimen.  Plan is to continue current antihypertensive medications.   Her updated medication list for this problem includes:    Enalapril Maleate 10 Mg Tabs (Enalapril maleate) .Marland Kitchen...  Take 2 tablets by mouth once a day    Hydrochlorothiazide 12.5 Mg Caps (Hydrochlorothiazide) .Marland Kitchen... Take 1 capsule by mouth once a day  BP today: 105/71 Prior BP: 115/71 (01/12/2010)  Labs Reviewed: K+: 4.2 (11/24/2009) Creat: : 0.48 (11/24/2009)   Chol: 146 (11/24/2009)   HDL: 34 (11/24/2009)   LDL: 66 (11/24/2009)   TG: 231 (11/24/2009)  Problem # 3:  HYPERCHOLESTEROLEMIA (ICD-272.0) Patient's LDL is at goal on current regimen. She is doing well on Lipitor with no apparent side effects. Will continue at current dose.  Her updated medication list for this problem includes:    Lipitor 10 Mg Tabs (Atorvastatin calcium) .Marland Kitchen... Take 1 tablet by mouth once a  day  Labs Reviewed: SGOT: 18 (11/24/2009)   SGPT: 14 (11/24/2009)   HDL:34 (11/24/2009), 39 (07/07/2009)  LDL:66 (11/24/2009), 86 (07/07/2009)  Chol:146 (11/24/2009), 173 (07/07/2009)  Trig:231 (11/24/2009), 239 (07/07/2009)  Problem # 4:  KNEE PAIN, LEFT (ICD-719.46) Patient has persistent left knee pain and stiffness without significant improvement. The plan is to continue tramadol for pain, supplemented if needed by hydromorphone; I advised her to limit the use of hydromorphone as far as possible. I will also refer her for orthopedic surgery evaluation.  Her updated medication list for this problem includes:    Aspirin 81 Mg Tbec (Aspirin) .Marland Kitchen... Take 1 tablet by mouth once a day    Carisoprodol 350 Mg Tabs (Carisoprodol) .Marland Kitchen... Take 1 tablet by mouth four times a day as needed for muscle spasm    Tramadol Hcl 50 Mg Tabs (Tramadol hcl) .Marland Kitchen... Take 1 tablet by mouth every 6 hours as needed for back pain    Hydromorphone Hcl 2 Mg Tabs (Hydromorphone hcl) .Marland Kitchen... Take 1 tablet by mouth every 6 hours as needed for pain  Orders: Orthopedic Referral (Ortho)  Problem # 5:  HYPOTHYROIDISM (ICD-244.9) Patient has no current symptoms of thyroid dysfunction. Will continue Synthroid at current dose.  Her updated medication list for this problem  includes:    Synthroid 75 Mcg Tabs (Levothyroxine sodium) .Marland Kitchen... Take 1 tablet by mouth once a day  Labs Reviewed: TSH: 4.035 (11/24/2009)    HgBA1c: 6.3 (03/02/2010) Chol: 146 (11/24/2009)   HDL: 34 (11/24/2009)   LDL: 66 (11/24/2009)   TG: 231 (11/24/2009)  Problem # 6:  HEMOCCULT POSITIVE STOOL (ICD-578.1) A GI workup is planned by Dr. Ewing Schlein.  Problem # 7:  GERD (ICD-530.81) Patient symptoms are controlled on current dose of Nexium.  Her updated medication list for this problem includes:    Nexium 40 Mg Cpdr (Esomeprazole magnesium) .Marland Kitchen... Take 1 capsule by mouth once a day  Problem # 8:  ANEMIA, IRON DEFICIENCY (ICD-280.9) Patient has no symptoms; given her recently noted occult blood positive stool, will check a CBC today.  Her updated medication list for this problem includes:    Ferrous Sulfate 325 (65 Fe) Mg Tabs (Ferrous sulfate) .Marland Kitchen... Take 1 tablet by mouth three times a day  Hgb: 11.9 (12/15/2009)   Hct: 36.3 (12/15/2009)   Platelets: 206 (12/15/2009) RBC: 4.15 (12/15/2009)   RDW: 13.9 (12/15/2009)   WBC: 5.8 (12/15/2009) MCV: 87.5 (12/15/2009)   MCHC: 32.8 (12/15/2009) Retic Ct: 87.2 K/uL (12/15/2009)   Ferritin: 19 (12/15/2009) Iron: 94 (12/15/2009)   TIBC: 419 (12/15/2009)   % Sat: 22 (12/15/2009) B12: 265 (12/15/2009)   Folate: 769 (12/15/2009)   TSH: 4.035 (11/24/2009)  Orders: T-CBC w/Diff ([id number])  Complete Medication List: 1)  Enalapril Maleate 10 Mg Tabs (Enalapril maleate) .... Take 2 tablets by mouth once a day 2)  Albuterol 90 Mcg/act Aers (Albuterol) .... Inhale 2 puffs four times a day  as needed 3)  Azmacort 100 Mcg/act Aers (Triamcinolone acetonide(inhal)) .... Inhale 2 puffs two times a day 4)  Xanax 1 Mg Tabs (Alprazolam) .... Take 1 tablet by mouth four times a day for anxiety 5)  Synthroid 75 Mcg Tabs (Levothyroxine sodium) .... Take 1 tablet by mouth once a day 6)  Glucophage Xr 500 Mg Tb24 (Metformin hcl) .... Take 2 tablets by mouth each am  and 1 tablet each pm 7)  Lipitor 10 Mg Tabs (Atorvastatin calcium) .... Take 1 tablet by mouth once a day 8)  Nexium 40 Mg Cpdr (  Esomeprazole magnesium) .... Take 1 capsule by mouth once a day 9)  Aspirin 81 Mg Tbec (Aspirin) .... Take 1 tablet by mouth once a day 10)  Flonase 50 Mcg/act Susp (Fluticasone propionate) .... Take 2 sprays in each nostril once a day 11)  Metamucil Powd (Psyllium powd) .... Take 1 tablespoonful in 8 ounces of water once a day to three times a day as needed for constipation 12)  Glucotrol 5 Mg Tabs (Glipizide) .... Take 1/2 tablet by mouth two times a day 13)  Colace 100 Mg Caps (Docusate sodium) .... Take 1 capsule by mouth two times a day. 14)  Carisoprodol 350 Mg Tabs (Carisoprodol) .... Take 1 tablet by mouth four times a day as needed for muscle spasm 15)  Nystatin 100000 Unit/gm Powd (Nystatin) .... Apply to affected area two times a day for 10 days 16)  Hydrochlorothiazide 12.5 Mg Caps (Hydrochlorothiazide) .... Take 1 capsule by mouth once a day 17)  Tramadol Hcl 50 Mg Tabs (Tramadol hcl) .... Take 1 tablet by mouth every 6 hours as needed for back pain 18)  Fexofenadine Hcl 60 Mg Tabs (Fexofenadine hcl) .... Take 1 tablet by mouth two times a day for allergies 19)  Gabapentin 300 Mg Caps (Gabapentin) .... Take 1 capsule by mouth four times a day 20)  Erythromycin Ethylsuccinate 400 Mg Tabs (Erythromycin ethylsuccinate) .... Take 1 tablet by mouth two times a day 21)  Hydromorphone Hcl 2 Mg Tabs (Hydromorphone hcl) .... Take 1 tablet by mouth every 6 hours as needed for pain 22)  Ferrous Sulfate 325 (65 Fe) Mg Tabs (Ferrous sulfate) .... Take 1 tablet by mouth three times a day  Patient Instructions: 1)  Please schedule a follow-up appointment in 2 months. 2)  Please make an appointment with Diabetes Educator Jamison Neighbor. 3)  If you have any further low blood sugars, stop glipizide and call us.  Prescriptions: FERROUS SULFATE 325 (65 FE) MG TABS (FERROUS  SULFATE) Take 1 tablet by mouth three times a day  #90 x 2   Entered and Authorized by:   [name] MD   Signed by:   [name] MD on 03/02/2010   Method used:   Electronically to        [facility]  [address] [phone]* (retail)       [address]       [place], [place]  [address]       Ph: [phone]       Fax: [phone]   RxID:   [id number] HYDROMORPHONE HCL 2 MG TABS (HYDROMORPHONE HCL) Take 1 tablet by mouth every 6 hours as needed for pain  #30 x 0   Entered and Authorized by:   [name] MD   Signed by:   [name] MD on 03/02/2010   Method used:   Print then Give to Patient   RxID:   [id number] FEXOFENADINE HCL 60 MG TABS (FEXOFENADINE HCL) Take 1 tablet by mouth two times a day for allergies  #60 Tablet x 5   Entered and Authorized by:   [name] MD   Signed by:   [name] MD on 03/02/2010   Method used:   Electronically to        [facility]  [address] [phone]* (retail)       [address] [facility]       [place], [place]  [address]       Ph: [phone]       Fax: [phone]   RxID:   [id number] TRAMADOL HCL 50 MG TABS (TRAMADOL HCL) Take 1 tablet by mouth every 6 hours as needed for back pain  #120 x 3   Entered and Authorized by:   [name] MD   Signed by:   [name] MD on 03/02/2010   Method used:   Electronically to        [facility]  [address] [phone]* (retail)       [address]       [place], [place]  [address]       Ph: [phone]       Fax: [phone]   RxID:   [id number] HYDROCHLOROTHIAZIDE 12.5 MG CAPS (HYDROCHLOROTHIAZIDE) Take 1 capsule by mouth once a day  #31 x 6   Entered and Authorized by:   [name] MD   Signed by:   [name] MD on 03/02/2010   Method used:   Electronically to        [facility]  [address] [phone]* (retail)       [address]       [place], [place]  [address]       Ph: [phone]       Fax: [phone]    RxID:   [id number] CARISOPRODOL 350 MG TABS (CARISOPRODOL) Take 1 tablet by mouth four times a day as needed for muscle spasm  #90 Tablet x 3   Entered and Authorized by:   [name] MD   Signed by:   [name] MD on 03/02/2010   Method used:   Electronically to        [facility]  [address] [phone]* (retail)       [address]       [place], [place]  [address]       Ph: [phone]       Fax: [phone]   RxID:   [id number] FLONASE 50 MCG/ACT SUSP (FLUTICASONE PROPIONATE) Take 2 sprays in each nostril once a day  # 63month sup x 3   Entered and Authorized by:   [name] MD   Signed by:   [name] MD on 03/02/2010   Method used:   [facility]        [facility]  [address] [phone]* (retail)       [address]       [place], [place]  [address]       Ph: [phone]       Fax: [phone]   RxID:   [id number] LIPITOR 10 MG TABS (ATORVASTATIN CALCIUM) Take 1 tablet by mouth once a day  #31 Tablet x 3   Entered and Authorized by:   [name] MD   Signed by:   [name] MD on 03/02/2010   Method used:   [facility]ronically to        [facility]  [address] [phone]* (retail)       [address]       [place], [place]  [address]       Ph: [phone]       Fax: [phone]   RxID:   [id number]  Process Orders Check Orders Results:     [facility]: Check successful Tests Sent for requisitioning (March 02, 2010 5:58 PM):     03/02/2010: [facility] -- T-Comprehensive Metabolic Panel [[id number]] (signed)     03/02/2010: [facility] -- [facility] w/Diff [[id number]] (signed)    Prevention & Chronic Care Immunizations   Influenza vaccine: Fluvax 3+  (11/24/2009)   Influenza vaccine due: 07/21/2009    Tetanus booster: 08/21/2007: Td    Pneumococcal vaccine: Not documented  Other Screening   Pap smear: Interpretation:Negative for  intraepithelial lesions or malignancy.     (04/05/2007)    Mammogram: ASSESSMENT: Negative - BI-RADS 1  (05/04/2009)   Mammogram due: 04/2010  Reports requested:   Last Pap report requested.  Smoking status: quit  (03/02/2010)    Screening comments: Patient reports having had a Pap within the past year by Dr. Clearance Coots; will request copy of report.  Diabetes Mellitus   HgbA1C: 6.3  (03/02/2010)   HgbA1C action/deferral: Ordered  (03/02/2010)   Hemoglobin A1C due: 06/01/2010    Eye exam: No diabetic retinopathy.   Exam by Dr. Ernesto Rutherford  (12/10/2009)   Eye exam due: 12/2010    Foot exam: yes  (04/21/2009)   Foot exam action/deferral: Refused   High risk foot: Yes  (07/07/2009)   Foot care education: Done  (07/07/2009)   Foot exam due: 10/07/2009    Urine microalbumin/creatinine ratio: 9.2  (04/21/2009)   Urine microalbumin/cr due: 04/21/2010    Diabetes flowsheet reviewed?: Yes   Progress toward A1C goal: At goal  Lipids   Total Cholesterol: 146  (11/24/2009)   Lipid panel action/deferral: LDL Direct Ordered   LDL: 66  (11/24/2009)   LDL Direct: 101  (07/07/2009)   HDL: 34  (11/24/2009)   Triglycerides: 231  (11/24/2009)   Lipid panel due: 05/24/2010    SGOT (AST): 18  (11/24/2009)   SGPT (ALT): 14  (11/24/2009) CMP ordered    Alkaline phosphatase: 71  (11/24/2009)   Total bilirubin: 0.3  (11/24/2009)   Liver panel due: 05/24/2010    Lipid flowsheet reviewed?: Yes   Progress toward LDL goal: At goal  Hypertension   Last Blood Pressure: 105 / 71  (03/02/2010)   Serum creatinine: 0.48  (11/24/2009)   Serum potassium 4.2  (11/24/2009) CMP ordered    Basic metabolic panel due: 09/01/2010    Hypertension flowsheet reviewed?: Yes   Progress toward BP goal: At goal  Self-Management Support :   Personal Goals (by the next clinic visit) :     Personal A1C goal: 7  (12/15/2009)     Personal blood pressure goal: 130/80  (12/15/2009)     Personal LDL goal: 100   (12/15/2009)    Patient will work on the following items until the next clinic visit to reach self-care goals:     Medications and monitoring: take my medicines every day, check my blood sugar  (03/02/2010)     Eating: drink diet soda or water instead of juice or soda, eat more vegetables, use fresh or frozen vegetables, eat foods that are low in salt, eat baked foods instead of fried foods, eat fruit for snacks and desserts, limit or avoid alcohol  (03/02/2010)     Home glucose monitoring frequency: 1 time daily  (01/12/2010)    Diabetes self-management support: Education handout, Resources for patients handout  (03/02/2010)   Diabetes education handout printed    Hypertension self-management support: Education handout, Resources for patients handout  (  03/02/2010)   Hypertension education handout printed    Lipid self-management support: Education handout, Resources for patients handout  (03/02/2010)     Lipid education handout printed      Resource handout printed.   Nursing Instructions: Request report of last Pap - Dr. Clearance Coots   Laboratory Results   Blood Tests   Date/Time Received: March 02, 2010 11:38 AM Date/Time Reported: Alric Quan  March 02, 2010 11:38 AM   HGBA1C: 6.3%   (Normal Range: Non-Diabetic - 3-6%   Control Diabetic - 6-8%) CBG Fasting:: 134mg /dL

## 2010-12-22 NOTE — Progress Notes (Signed)
Summary: REFILL MEDICATION  Phone Note Call from Patient   Caller: Patient Call For: [name] MD Reason for Call: Refill Medication Details for Reason: need pain med. Summary of Call: MISS Talavera CALLED THIS MORNING REQUESTING PAIN MEDICINE FOR HER BACK. THE BACK IS REALLY BOTHERING HER .I WILL SEND THISTO THE REFILL NURSE. Initial call taken by: Filomena Jungling NT II,  October 26, 2010 10:52 AM

## 2010-12-22 NOTE — Miscellaneous (Signed)
Summary: [facility] ORDER  [facility]   Imported By: Shon Hough 08/22/2010 15:22:16  _____________________________________________________________________  External Attachment:    Type:   Image     Comment:   External Document

## 2010-12-22 NOTE — Progress Notes (Signed)
Summary: refill/ hla  Phone Note Refill Request Message from:  Patient on November 22, 2010 12:15 PM  Refills Requested: Medication #1:  HYDROMORPHONE HCL 2 MG TABS Take 1 tablet by mouth every 6 hours as needed for pain   Dosage confirmed as above?Dosage Confirmed   Last Refilled: 12/8 Initial call taken by: Marin Roberts RN,  November 22, 2010 12:15 PM  Follow-up for Phone Call        Rx printed and signed - nurse to complete. Follow-up by: [name] MD,  November 22, 2010 2:14 PM  Additional Follow-up for Phone Call Additional follow up Details #1::        pt notified, states carla varner will pick up, informed to tell ms varner to have pic id w/ her Additional Follow-up by: Marin Roberts RN,  November 22, 2010 2:35 PM    Prescriptions: HYDROMORPHONE HCL 2 MG TABS (HYDROMORPHONE HCL) Take 1 tablet by mouth every 6 hours as needed for pain  #30 x 0   Entered and Authorized by:   [name] MD   Signed by:   [name] MD on 11/22/2010   Method used:   Print then Give to Patient   RxID:   [id number]

## 2010-12-22 NOTE — Assessment & Plan Note (Signed)
Summary: EST-CK/FU/MEDS/CFB   Vital Signs:  Patient profile:   50 year old female Height:      62 inches Weight:      226.6 pounds BMI:     41.60 O2 Sat:      98 % on Room air Temp:     96.6 degrees F oral Pulse rate:   88 / minute BP sitting:   118 / 71  (right arm)  Vitals Entered By: Filomena Jungling NT II (April 20, 2010 11:25 AM)  O2 Flow:  Room air CC: follow-up visit Is Patient Diabetic? Yes Did you bring your meter with you today? No Nutritional Status BMI of > 30 = obese  Have you ever been in a relationship where you felt threatened, hurt or afraid?No   Does patient need assistance? Functional Status Self care Ambulation Normal, Impaired:Risk for fall Comments cane   Diabetic Foot Exam Foot Inspection Is there a history of a foot ulcer?              No Is there a foot ulcer now?              No Can the patient see the bottom of their feet?          Yes Are the shoes appropriate in style and fit?          Yes Is there swelling or an abnormal foot shape?          No Are the toenails long?                No Are the toenails thick?                No Are the toenails ingrown?              No Is there heavy callous build-up?              Yes Is there pain in the calf muscle (Intermittent claudication) when walking?    NoIs there a claw toe deformity?              No Is there elevated skin temperature?            No Is there limited ankle dorsiflexion?            No Is there foot or ankle muscle weakness?            No  Diabetic Foot Care Education Patient educated on appropriate care of diabetic feet.  Pulse Check          Right Foot          Left Foot Posterior Tibial:        normal            normal Dorsalis Pedis:        normal            normal Comments: Callus on heels.   10-g (5.07) Semmes-Weinstein Monofilament Test Performed by: Filomena Jungling NT II          Right Foot          Left Foot Site 1         normal         normal Site 2         normal          normal Site 3         normal  normal Site 4         normal         normal Site 5         normal         normal Site 6         normal         normal Site 7         normal         normal Site 8         normal         normal Site 9         normal         normal  Impression      normal         normal   Primary Care Provider:  [name] MD  CC:  follow-up visit.  History of Present Illness: Patient returns for followup of her left knee pain, diabetes mellitus, hypertension, hypercholesterolemia, hypothyroidism, and other medical problems.  She reports ongoing left knee pain; she reports that her orthopedic appointment was rescheduled by their office to next Wednesday.  She is using hydromorphone as needed for the pain.  She reports a few blood sugars in the 40s on her current medication regimen.  She reports that Dr. Ewing Schlein recently did upper endoscopy and colonoscopy.  She reports that she is compliant with her medications.  Preventive Screening-Counseling & Management  Alcohol-Tobacco     Smoking Status: quit     Smoking Cessation Counseling: yes     Year Quit: 1997     Passive Smoke Exposure: no  Caffeine-Diet-Exercise     Does Patient Exercise: yes     Type of exercise: WALKING     Times/week: 5-7W  Colonoscopy  Procedure date:  04/08/2010  Findings:      Small external and internal hemorrhoids. A few benign appearing diminutive polyps in the rectum, in the distal sigmoid colon and in the distal descending colon. Pathology showed hyperplastic polyps and polypoid fragments of benign colonic mucosa.   Exam by Vida Rigger, MD  EGD  Procedure date:  04/08/2010  Findings:      Normal esophagus. Chronic gastritis.  Biopsied. A few gastric polyps. [Removed/Retrieved]. This was biopsied. Normal second part of the duodenum and duodenal bulb. The examination was otherwise normal. Pathology showed a fundic gland polyp; no Helicobacter pylori, intestinal metaplasia, or  dysplasia  noted; no adenomatous changes or malignancy identified. Exam by Vida Rigger, MD  Current Medications (verified): 1)  Enalapril Maleate 10 Mg Tabs (Enalapril Maleate) .... Take 2 Tablets By Mouth Once A Day 2)  Ventolin Hfa 108 (90 Base) Mcg/act Aers (Albuterol Sulfate) .... Inhale 2 Puffs Four Times A Day  As Needed 3)  Azmacort 100 Mcg/act Aers (Triamcinolone Acetonide(Inhal)) .... Inhale 2 Puffs Two Times A Day 4)  Xanax 1 Mg Tabs (Alprazolam) .... Take 1 Tablet By Mouth Four Times A Day For Anxiety 5)  Synthroid 75 Mcg Tabs (Levothyroxine Sodium) .... Take 1 Tablet By Mouth Once A Day 6)  Glucophage Xr 500 Mg  Tb24 (Metformin Hcl) .... Take 2 Tablets By Mouth Each Am and 1 Tablet Each Pm 7)  Lipitor 10 Mg Tabs (Atorvastatin Calcium) .... Take 1 Tablet By Mouth Once A Day 8)  Nexium 40 Mg Cpdr (Esomeprazole Magnesium) .... Take 1 Capsule By Mouth Once A Day 9)  Aspirin 81 Mg Tbec (Aspirin) .... Take 1 Tablet By Mouth Once A Day 10)  Flonase 50 Mcg/act Susp (Fluticasone Propionate) .... Take 2 Sprays in Each Nostril Once A Day 11)  Metamucil  Powd (Psyllium Powd) .... Take 1 Tablespoonful in 8 Ounces of Water Once A Day To Three Times A Day As Needed For Constipation 12)  Glucotrol 5 Mg Tabs (Glipizide) .... Take 1/2 Tablet By Mouth Once Daily in The Am 13)  Colace 100 Mg  Caps (Docusate Sodium) .... Take 1 Capsule By Mouth Two Times A Day. 14)  Carisoprodol 350 Mg Tabs (Carisoprodol) .... Take 1 Tablet By Mouth Four Times A Day As Needed For Muscle Spasm 15)  Nystatin 100000 Unit/gm Powd (Nystatin) .... Apply To Affected Area Two Times A Day For 10 Days 16)  Hydrochlorothiazide 12.5 Mg Caps (Hydrochlorothiazide) .... Take 1 Capsule By Mouth Once A Day 17)  Tramadol Hcl 50 Mg Tabs (Tramadol Hcl) .... Take 1 Tablet By Mouth Every 6 Hours As Needed For Back Pain 18)  Fexofenadine Hcl 60 Mg Tabs (Fexofenadine Hcl) .... Take 1 Tablet By Mouth Two Times A Day For Allergies 19)  Gabapentin  300 Mg Caps (Gabapentin) .... Take 1 Capsule By Mouth Four Times A Day 20)  Erythromycin Ethylsuccinate 400 Mg Tabs (Erythromycin Ethylsuccinate) .... Take 1 Tablet By Mouth Two Times A Day 21)  Hydromorphone Hcl 2 Mg Tabs (Hydromorphone Hcl) .... Take 1 Tablet By Mouth Every 6 Hours As Needed For Pain 22)  Ferrous Sulfate 325 (65 Fe) Mg Tabs (Ferrous Sulfate) .... Take 1 Tablet By Mouth Three Times A Day  Allergies (verified): 1)  ! Codeine Sulfate (Codeine Sulfate) 2)  ! Penicillin V Potassium (Penicillin V Potassium) 3)  ! Tetracycline Hcl (Tetracycline Hcl) 4)  ! Minocycline Hcl (Minocycline Hcl) 5)  ! Darvocet-N 100  Physical Exam  General:  alert, no distress Lungs:  normal respiratory effort, normal breath sounds, no crackles, and no wheezes.   Heart:  normal rate, regular rhythm, no murmur, no gallop, and no rub.   Msk:  left knee is moderately tender to palpation along the joint line; there is no warmth, swelling, or erythema Extremities:  no edema  Diabetes Management Exam:    Foot Exam (with socks and/or shoes not present):       Sensory-Monofilament:          Left foot: normal          Right foot: normal   Impression & Recommendations:  Problem # 1:  KNEE PAIN, LEFT (ICD-719.46) Patient has an orthopedics appointment for next week. I advised her to use the hydromorphone sparingly and only if needed for significant pain, and I explained the potential problems with ongoing opioid use.  Her updated medication list for this problem includes:    Aspirin 81 Mg Tbec (Aspirin) .Marland Kitchen... Take 1 tablet by mouth once a day    Carisoprodol 350 Mg Tabs (Carisoprodol) .Marland Kitchen... Take 1 tablet by mouth four times a day as needed for muscle spasm    Tramadol Hcl 50 Mg Tabs (Tramadol hcl) .Marland Kitchen... Take 1 tablet by mouth every 6 hours as needed for back pain    Hydromorphone Hcl 2 Mg Tabs (Hydromorphone hcl) .Marland Kitchen... Take 1 tablet by mouth every 6 hours as needed for pain  Problem # 2:  DIABETES  MELLITUS, TYPE II (ICD-250.00) Patient's diabetes mellitus is well controlled on current regimen.  Given her episodic low blood sugars in the 40s, the plan is to stop glipizide and continue metformin at current dose. I advised patient to continue monitoring  her blood sugar and to let me know if she has any further low blood sugar values.  The following medications were removed from the medication list:    Glucotrol 5 Mg Tabs (Glipizide) .Marland Kitchen... Take 1/2 tablet by mouth once daily in the am Her updated medication list for this problem includes:    Enalapril Maleate 10 Mg Tabs (Enalapril maleate) .Marland Kitchen... Take 2 tablets by mouth once a day    Glucophage Xr 500 Mg Tb24 (Metformin hcl) .Marland Kitchen... Take 2 tablets by mouth each am and 1 tablet each pm    Aspirin 81 Mg Tbec (Aspirin) .Marland Kitchen... Take 1 tablet by mouth once a day  Labs Reviewed: Creat: 0.65 (03/02/2010)     Last Eye Exam: No diabetic retinopathy.   Exam by Dr. Ernesto Rutherford (12/10/2009) Reviewed HgBA1c results: 6.6 (04/20/2010)  6.3 (03/02/2010)  Orders: T- Capillary Blood Glucose (82948) T-Hgb A1C (in-house) (16109UE)  Problem # 3:  HYPERTENSION (ICD-401.9) Patient's blood pressure is well controlled on current regimen.  Plan is to continue current antihypertensive medications.   Her updated medication list for this problem includes:    Enalapril Maleate 10 Mg Tabs (Enalapril maleate) .Marland Kitchen... Take 2 tablets by mouth once a day    Hydrochlorothiazide 12.5 Mg Caps (Hydrochlorothiazide) .Marland Kitchen... Take 1 capsule by mouth once a day  BP today: 118/71 Prior BP: 105/71 (03/02/2010)  Labs Reviewed: K+: 4.2 (03/02/2010) Creat: : 0.65 (03/02/2010)   Chol: 146 (11/24/2009)   HDL: 34 (11/24/2009)   LDL: 66 (11/24/2009)   TG: 231 (11/24/2009)  Problem # 4:  HYPOTHYROIDISM (ICD-244.9) Plan is to check a TSH on current dose of Synthroid.  Her updated medication list for this problem includes:    Synthroid 75 Mcg Tabs (Levothyroxine sodium) .Marland Kitchen... Take 1  tablet by mouth once a day  Orders: T-TSH [phone])  Labs Reviewed: TSH: 4.035 (11/24/2009)    HgBA1c: 6.6 (04/20/2010) Chol: 146 (11/24/2009)   HDL: 34 (11/24/2009)   LDL: 66 (11/24/2009)   TG: 231 (11/24/2009)  Problem # 5:  HYPERCHOLESTEROLEMIA (ICD-272.0) Patient has been off of Lipitor since January of this year due to the potential interaction of that medication with her erythromycin. She reports that her rosacea has responded well to the erythromycin. She reports that she is fasting today, so I will check a fasting lipid profile. If medication is indicated to treat hyperlipidemia, then will consider alternatives to a statin medication since she continues on the erythromycin prescribed by her dermatologist.  Her updated medication list for this problem includes:    Lipitor 10 Mg Tabs (Atorvastatin calcium) .Marland Kitchen... Take 1 tablet by mouth once a day  Labs Reviewed: SGOT: 24 (03/02/2010)   SGPT: 25 (03/02/2010)   HDL:34 (11/24/2009), 39 (07/07/2009)  LDL:66 (11/24/2009), 86 (07/07/2009)  Chol:146 (11/24/2009), 173 (07/07/2009)  Trig:231 (11/24/2009), 239 (07/07/2009)  Orders: T-Lipid Profile ([id number])  Problem # 6:  HEMOCCULT POSITIVE STOOL (ICD-578.1) Patient recently underwent GI workup by Dr. Ewing Schlein for Hemoccult-positive stools and iron deficiency anemia. Colonoscopy on Apr 08, 2010 showed small external and internal hemorrhoids, and a few benign appearing diminutive polyps in the rectum, in the distal sigmoid colon and in the distal descending colon; pathology showed hyperplastic polyps.  Upper endoscopy on Apr 08, 2010 showed a normal esophagus, chronic gastritis that was biopsied, a few gastric polyps that were biopsied, and an otherwise normal exam; pathology showed a fundic gland polyp, with no Helicobacter pylori, intestinal metaplasia, or dysplasia  noted, and no adenomatous changes or malignancy identified.  Problem # 7:  ANEMIA, IRON DEFICIENCY (ICD-280.9) Will check  labs as below, and determine whether continued ferrous sulfate is indicated.  Her updated medication list for this problem includes:    Ferrous Sulfate 325 (65 Fe) Mg Tabs (Ferrous sulfate) .Marland Kitchen... Take 1 tablet by mouth three times a day  Orders: T-CBC w/Diff ([id number]) T-Ferritin ([id number])  Complete Medication List: 1)  Enalapril Maleate 10 Mg Tabs (Enalapril maleate) .... Take 2 tablets by mouth once a day 2)  Ventolin Hfa 108 (90 Base) Mcg/act Aers (Albuterol sulfate) .... Inhale 2 puffs four times a day  as needed 3)  Azmacort 100 Mcg/act Aers (Triamcinolone acetonide(inhal)) .... Inhale 2 puffs two times a day 4)  Xanax 1 Mg Tabs (Alprazolam) .... Take 1 tablet by mouth four times a day for anxiety 5)  Synthroid 75 Mcg Tabs (Levothyroxine sodium) .... Take 1 tablet by mouth once a day 6)  Glucophage Xr 500 Mg Tb24 (Metformin hcl) .... Take 2 tablets by mouth each am and 1 tablet each pm 7)  Lipitor 10 Mg Tabs (Atorvastatin calcium) .... Take 1 tablet by mouth once a day 8)  Nexium 40 Mg Cpdr (Esomeprazole magnesium) .... Take 1 capsule by mouth once a day 9)  Aspirin 81 Mg Tbec (Aspirin) .... Take 1 tablet by mouth once a day 10)  Flonase 50 Mcg/act Susp (Fluticasone propionate) .... Take 2 sprays in each nostril once a day 11)  Metamucil Powd (Psyllium powd) .... Take 1 tablespoonful in 8 ounces of water once a day to three times a day as needed for constipation 12)  Colace 100 Mg Caps (Docusate sodium) .... Take 1 capsule by mouth two times a day. 13)  Carisoprodol 350 Mg Tabs (Carisoprodol) .... Take 1 tablet by mouth four times a day as needed for muscle spasm 14)  Nystatin 100000 Unit/gm Powd (Nystatin) .... Apply to affected area two times a day for 10 days 15)  Hydrochlorothiazide 12.5 Mg Caps (Hydrochlorothiazide) .... Take 1 capsule by mouth once a day 16)  Tramadol Hcl 50 Mg Tabs (Tramadol hcl) .... Take 1 tablet by mouth every 6 hours as needed for back pain 17)   Fexofenadine Hcl 60 Mg Tabs (Fexofenadine hcl) .... Take 1 tablet by mouth two times a day for allergies 18)  Gabapentin 300 Mg Caps (Gabapentin) .... Take 1 capsule by mouth four times a day 19)  Erythromycin Ethylsuccinate 400 Mg Tabs (Erythromycin ethylsuccinate) .... Take 1 tablet by mouth two times a day 20)  Hydromorphone Hcl 2 Mg Tabs (Hydromorphone hcl) .... Take 1 tablet by mouth every 6 hours as needed for pain 21)  Ferrous Sulfate 325 (65 Fe) Mg Tabs (Ferrous sulfate) .... Take 1 tablet by mouth three times a day   Patient Instructions: 1)  Please schedule a follow-up appointment in 2 months. 2)  Stop glipizide. 3)  Call if you have any low blood sugars.  Prescriptions: HYDROMORPHONE HCL 2 MG TABS (HYDROMORPHONE HCL) Take 1 tablet by mouth every 6 hours as needed for pain  #30 x 0   Entered and Authorized by:   [name] MD   Signed by:   [name] MD on 04/20/2010   Method used:   Print then Give to Patient   RxID:   [id number]   Prevention & Chronic Care Immunizations   Influenza vaccine: Fluvax 3+  (11/24/2009)   Influenza vaccine due: 07/21/2009    Tetanus booster: 08/21/2007: Td    Pneumococcal vaccine: Not documented  Other Screening   Pap smear: Interpretation:Negative for intraepithelial lesions or malignancy.     (04/05/2007)    Mammogram: ASSESSMENT: Negative - BI-RADS 1  (05/04/2009)   Mammogram due: 04/2010   Smoking status: quit  (04/20/2010)  Diabetes Mellitus   HgbA1C: 6.6  (04/20/2010)   HgbA1C action/deferral: Ordered  (03/02/2010)   Hemoglobin A1C due: 06/01/2010    Eye exam: No diabetic retinopathy.   Exam by Dr. Ernesto Rutherford  (12/10/2009)   Eye exam due: 12/2010    Foot exam: yes  (04/20/2010)   Foot exam action/deferral: Do today   High risk foot: Yes  (07/07/2009)   Foot care education: Done  (04/20/2010)   Foot exam due: 10/07/2009    Urine microalbumin/creatinine ratio: 9.2  (04/21/2009)   Urine microalbumin/cr due:  04/21/2010    Diabetes flowsheet reviewed?: Yes   Progress toward A1C goal: At goal  Lipids   Total Cholesterol: 146  (11/24/2009)   Lipid panel action/deferral: LDL Direct Ordered   LDL: 66  (11/24/2009)   LDL Direct: 101  (07/07/2009)   HDL: 34  (11/24/2009)   Triglycerides: 231  (11/24/2009)   Lipid panel due: 05/24/2010    SGOT (AST): 24  (03/02/2010)   SGPT (ALT): 25  (03/02/2010)   Alkaline phosphatase: 72  (03/02/2010)   Total bilirubin: 0.4  (03/02/2010)   Liver panel due: 05/24/2010    Lipid flowsheet reviewed?: Yes   Progress toward LDL goal: At goal  Hypertension   Last Blood Pressure: 118 / 71  (04/20/2010)   Serum creatinine: 0.65  (03/02/2010)   Serum potassium 4.2  (03/02/2010)   Basic metabolic panel due: 09/01/2010    Hypertension flowsheet reviewed?: Yes   Progress toward BP goal: At goal  Self-Management Support :   Personal Goals (by the next clinic visit) :     Personal A1C goal: 7  (12/15/2009)     Personal blood pressure goal: 130/80  (12/15/2009)     Personal LDL goal: 100  (12/15/2009)    Patient will work on the following items until the next clinic visit to reach self-care goals:     Medications and monitoring: take my medicines every day, check my blood sugar, bring all of my medications to every visit  (04/20/2010)     Eating: eat fruit for snacks and desserts  (04/20/2010)     Home glucose monitoring frequency: 1 time daily  (04/20/2010)    Diabetes self-management support: Written self-care plan  (04/20/2010)   Diabetes care plan printed    Hypertension self-management support: Written self-care plan  (04/20/2010)   Hypertension self-care plan printed.    Lipid self-management support: Written self-care plan  (04/20/2010)   Lipid self-care plan printed.   Nursing Instructions: Diabetic foot exam today    Process Orders Check Orders Results:     [facility]: Check successful Tests Sent for requisitioning (April 20, 2010 7:56 PM):     04/20/2010: [facility] -- T-Lipid Profile [phone] (signed)     04/20/2010: [facility] -- T-CBC w/Diff [[id number]] (signed)     04/20/2010: [facility] -- T-Ferritin [phone] (signed)     04/20/2010: [facility] -- T-TSH [phone] (signed)   Laboratory Results   Blood Tests   Date/Time Received: April 20, 2010 11:37 AM Date/Time Reported: Alric Quan  April 20, 2010 11:37 AM  HGBA1C: 6.6%   (Normal Range: Non-Diabetic - 3-6%   Control Diabetic - 6-8%) CBG Fasting:: 208mg /dL

## 2010-12-22 NOTE — Consult Note (Signed)
Summary: [facility]   Imported By: Louretta Parma 08/24/2010 15:35:53  _____________________________________________________________________  External Attachment:    Type:   Image     Comment:   External Document

## 2010-12-22 NOTE — Progress Notes (Signed)
Summary: refill/gg  Phone Note Refill Request  on December 10, 2009 10:32 AM  Refills Requested: Medication #1:  HYDROMORPHONE HCL 2 MG TABS Take 1 tablet by mouth every 6 hours as needed for pain.   Last Refilled: 11/24/2009 Pt called with c/o pain to knee and back.  She is using 3 pills a day along with aleve.  She has been seen at [facility] med.  She was told it will take 12 weeks or more to heal.  Pain meds come from [place].  Pt # [id number]   Method Requested: Pick up at Office Initial call taken by: Merrie Roof RN,  December 10, 2009 10:33 AM  Follow-up for Phone Call        Refill printed and signed - nurse to complete. Follow-up by: [name] MD,  December 10, 2009 3:29 PM    Prescriptions: HYDROMORPHONE HCL 2 MG TABS (HYDROMORPHONE HCL) Take 1 tablet by mouth every 6 hours as needed for pain  #60 x 0   Entered and Authorized by:   [name] MD   Signed by:   [name] MD on 12/10/2009   Method used:   Print then Give to Patient   RxID:   [id number]

## 2010-12-22 NOTE — Progress Notes (Signed)
  Phone Note Call from Patient Call back at Home Phone [phone]   Caller: Patient Call For: [name] MD Reason for Call: Refill Medication Details for Reason: NEED PAIN MEDICATION Summary of Call: Message lefton my voicemail yesterday, for refill on her pain medication patient stated that she went to her sports medicine appointment, but they could not refill her medication.I will send this to the opc refill line. Initial call taken by: Filomena Jungling NT II,  December 10, 2009 10:22 AM  Follow-up for Phone Call        See other note for refill details. Follow-up by: [name] MD,  December 10, 2009 3:29 PM

## 2010-12-22 NOTE — Consult Note (Signed)
Summary: [facility]   Imported By: Margie Billet 05/18/2010 14:58:49  _____________________________________________________________________  External Attachment:    Type:   Image     Comment:   External Document

## 2010-12-22 NOTE — Progress Notes (Signed)
Summary: Decrease Glipizide  Phone Note Other Incoming   Summary of Call: i have called pt twice, left message.  Follow-up for Phone Call        Patient called back on April 21 and I gave her the instructions to stop her PM glipizide (see note). Follow-up by: [name] MD,  March 11, 2010 4:51 PM

## 2010-12-22 NOTE — Progress Notes (Signed)
Summary: med refill/gp  Phone Note Refill Request Message from:  Patient on September 07, 2010 4:38 PM  Refills Requested: Medication #1:  ACCU-CHEK COMPACT TEST DRUM  STRP use to check blood sugar one time a day  Method Requested: Telephone to Pharmacy Initial call taken by: Chinita Pester RN,  September 07, 2010 4:38 PM  Follow-up for Phone Call        Refill approved-nurse to complete. Follow-up by: [name] MD,  September 08, 2010 5:06 PM  Additional Follow-up for Phone Call Additional follow up Details #1::        Call from pt -no longer wants to use [facility].   Pt would like to use [facility].  She will be changing testing meter to Easy Max.  Pt has the new meter.  Now needs an order for Easy Max Testing Strips sent to [facility].  Call to [facility]  at [facility] x 2 at [phone] to get faxing information.  Message was let x 1 and second call was not answered. Additional Follow-up by: Angelina Ok RN,  September 14, 2010 4:18 PM    Additional Follow-up for Phone Call Additional follow up Details #2::    I changed test strips to Easymax strips as requested; prescription signed, nurse to complete. Follow-up by: [name] MD,  October 07, 2010 2:53 PM  New/Updated Medications: EASYMAX TEST  STRP (GLUCOSE BLOOD) Use to check blood sugar one time a day Prescriptions: EASYMAX TEST  STRP (GLUCOSE BLOOD) Use to check blood sugar one time a day  #30 x 11   Entered and Authorized by:   [name] MD   Signed by:   [name] MD on 10/07/2010   Method used:   Telephoned to ...       [facility] (retail)       [address]       [place], [place]  [address]       Ph: [phone]       Fax: [phone]   RxID:   [id number] ACCU-CHEK COMPACT TEST DRUM  STRP (GLUCOSE BLOOD) use to check blood sugar one time a day  #1 month x 11   Entered and Authorized by:   [name] MD   Signed by:   [name] MD on 09/08/2010   Method used:   Telephoned to ...       [facility] (mail-order)       [address] [place], [place]  [address]       Ph: [phone]       Fax: [phone]   RxID:   [id number]

## 2010-12-22 NOTE — Consult Note (Signed)
Summary: [facility]: Diabetic Eye Exam  [facility]: Diabetic Eye Exam   Imported By: Florinda Marker 12/15/2009 14:08:37  _____________________________________________________________________  External Attachment:    Type:   Image     Comment:   External Document  Appended Document: [facility]: Diabetic Eye Exam   Diabetic Eye Exam  Procedure date:  12/10/2009  Findings:      No diabetic retinopathy.   Exam by Dr. Ernesto Rutherford  Procedures Next Due Date:    Diabetic Eye Exam: 12/2010

## 2010-12-22 NOTE — Assessment & Plan Note (Signed)
Summary: lower back pain, wants mri/pcp-joines/hla   Vital Signs:  Patient profile:   50 year old female Height:      62 inches (157.48 cm) Temp:     97.3 degrees F (36.28 degrees C) oral Pulse rate:   82 / minute BP sitting:   108 / 72  (right arm)  Vitals Entered By: Stanton Kidney Ditzler RN (November 29, 2010 8:45 AM) CC: worsening back pain Is Patient Diabetic? Yes Did you bring your meter with you today? No Pain Assessment Patient in pain? yes     Location: back and knees Intensity: 10 Type: sharp Onset of pain  long time Nutritional Status BMI of > 30 = obese Nutritional Status Detail appetite down CBG Result 120  Have you ever been in a relationship where you felt threatened, hurt or afraid?denies   Does patient need assistance? Functional Status Self care, Cook/clean, Shopping, Social activities Ambulation Wheelchair Comments Sister with pt and helps with care. Discuss back pain - getting worse past 6 months. Pt ? MRI and doing labs. Had capsule endo done 11/28/10 Dr Ewing Schlein.   Primary Care Provider:  [name] MD  CC:  worsening back pain.  History of Present Illness: 50yo W with hx of chronic low back pain, L knee injury, DM2, HTN, HL, diabetic neuropathy, iron deficiency anemia who presents for evaluation of worsening back pain. She reports increased low back pain that sometimes radiates down either leg; pain has been progressing over the past several months but has been the worst this month. She is prescribed a regimen of tramadol, carisoprodol, hydromorphone, and gabapentin. She reports taking the carisoprodol about twice daily and relying heavily on hydromorphone for pain relief. She takes the gabapentin only occassionally and says that the tramadol does not help at all. She reports taking hydromorphone 2mg  every fours hours although she is prescribed to take it every 6h. She was most recently prescribed #30 hydromorphone tablets on 11/22/2010 and only has 4 tablets left.  She is followed by Dr. Ferd Hibbs for her knee pain; she previously underwent epidural injection for back pain but apparently had a complication ("red streak down my back") and is not interested in any more injections. Last MRI of lumbar spine was in 01/2007 and showed multiple disc protrusions and central canal stenosis at L3-4 with compression of L4 nerve root. She was previously seen by neurosurgery, Dr. Channing Mutters, but no surgical intervention was pursued at that time b/c of medical comorbidities.  Depression History:      The patient denies a depressed mood most of the day and a diminished interest in her usual daily activities.         Preventive Screening-Counseling & Management  Alcohol-Tobacco     Smoking Status: quit     Smoking Cessation Counseling: yes     Year Quit: 1997     Passive Smoke Exposure: no  Caffeine-Diet-Exercise     Does Patient Exercise: yes     Type of exercise: WALKING WHEN ABLE     Times/week: 5-7W  Current Medications (verified): 1)  Enalapril Maleate 10 Mg Tabs (Enalapril Maleate) .... Take 1 Tablet By Mouth Once A Day 2)  Ventolin Hfa 108 (90 Base) Mcg/act Aers (Albuterol Sulfate) .... Inhale 2 Puffs Four Times A Day  As Needed 3)  Azmacort 100 Mcg/act Aers (Triamcinolone Acetonide(Inhal)) .... Inhale 2 Puffs Two Times A Day 4)  Xanax 1 Mg Tabs (Alprazolam) .... Take 1 Tablet By Mouth Four Times A Day For Anxiety 5)  Synthroid 75 Mcg Tabs (Levothyroxine Sodium) .... Take 1 Tablet By Mouth Once A Day 6)  Glucophage Xr 500 Mg  Tb24 (Metformin Hcl) .... Take 2 Tablets By Mouth Each Am and 1 Tablet Each Pm 7)  Nexium 40 Mg Cpdr (Esomeprazole Magnesium) .... Take 1 Capsule By Mouth Once A Day 8)  Aspirin 81 Mg Tbec (Aspirin) .... Take 1 Tablet By Mouth Once A Day 9)  Flonase 50 Mcg/act Susp (Fluticasone Propionate) .... Take 2 Sprays in Each Nostril Once A Day 10)  Metamucil  Powd (Psyllium Powd) .... Take 1 Tablespoonful in 8 Ounces of Water Once A Day To Three Times A  Day As Needed For Constipation 11)  Colace 100 Mg  Caps (Docusate Sodium) .... Take 1 Capsule By Mouth Two Times A Day. 12)  Carisoprodol 350 Mg Tabs (Carisoprodol) .... Take 1 Tablet By Mouth Four Times A Day As Needed For Muscle Spasm 13)  Nystatin 100000 Unit/gm Powd (Nystatin) .... Apply To Affected Area Two Times A Day For 10 Days 14)  Hydrochlorothiazide 12.5 Mg Caps (Hydrochlorothiazide) .... Take 1 Capsule By Mouth Once A Day 15)  Tramadol Hcl 50 Mg Tabs (Tramadol Hcl) .... Take 1/2 Tablet By Mouth Every 6 Hours As Needed For Back Pain 16)  Fexofenadine Hcl 60 Mg Tabs (Fexofenadine Hcl) .... Take 1 Tablet By Mouth Two Times A Day For Allergies 17)  Hydromorphone Hcl 2 Mg Tabs (Hydromorphone Hcl) .... Take 1 Tablet By Mouth Every 6 Hours As Needed For Pain 18)  Ferrous Sulfate 325 (65 Fe) Mg Tabs (Ferrous Sulfate) .... Take 1 Tablet By Mouth Three Times A Day 19)  Easymax Test  Strp (Glucose Blood) .... Use To Check Blood Sugar One Time A Day 20)  Erythromycin Ethylsuccinate 400 Mg Tabs (Erythromycin Ethylsuccinate) .... Take 1 Tablet By Mouth Two Times A Day 21)  Vesicare 5 Mg Tabs (Solifenacin Succinate) .... Take 1 Tablet By Mouth Once A Day 22)  Ropinirole Hcl 0.5 Mg Tabs (Ropinirole Hcl) .... Take 1 Tablet By Mouth Three Times A Day 23)  Crestor 5 Mg Tabs (Rosuvastatin Calcium) .... Take 1 Tablet By Mouth Once A Day 24)  Gabapentin 300 Mg Caps (Gabapentin) .... Take 1 Tablet By Mouth Three Times A Day  Allergies: 1)  ! Codeine Sulfate (Codeine Sulfate) 2)  ! Penicillin V Potassium (Penicillin V Potassium) 3)  ! Tetracycline Hcl (Tetracycline Hcl) 4)  ! Minocycline Hcl (Minocycline Hcl) 5)  ! Darvocet-N 100  Past History:  Past Medical History: Last updated: 07/06/2006 Allergic rhinitis Anxiety disorder Asthma NOS w/o status asthmaticus Candidal intertrigo Carpal tunnel syndrome Degenerative disc disease Diabetes mellitus, type II Elevated liver  enzymes GERD Hypertension Hypothyroidism Low back pain Obesity Rosacea  Past Surgical History: Last updated: 06/27/2006 Release of right transverse carpal ligament 05/08/2000 by Dr. Josephine Igo  Family History: Last updated: 08/05/2008 Father had MI at age 47. No breast or colon cancer.  Review of Systems      See HPI General:  Denies chills, fever, malaise, and weakness. CV:  Denies chest pain or discomfort. Resp:  Denies shortness of breath. GI:  Denies abdominal pain and change in bowel habits. MS:  Complains of joint pain and low back pain. Neuro:  Complains of numbness and tingling. Psych:  Denies depression. Heme:  Denies fevers.  Physical Exam  General:  alert, sitting in wheelchair. Appears uncomfortable but in NAD.  Eyes:  vision grossly intact, pupils equal, pupils round, and pupils reactive to light.  Mouth:  pharynx pink and moist.   Neck:  supple and no masses.   Lungs:  normal breath sounds, no crackles, and no wheezes.   Heart:  normal rate, regular rhythm, no murmur, no gallop, and no rub.   Abdomen:  soft and non-tender.   Msk:  Tenderness to palpation of lumbar spine and paraspinal muscles. Patient refused to get on examination table for thorough exam, straight leg test b/c of pain.  Pulses:  DP pulses 2+ bilaterally.  Extremities:  No edema.  Neurologic:  alert & oriented X3, cranial nerves grosslyI intact, strength normal in all extremities, and sensation intact to light touch bilaterally.   Skin:  turgor normal and no rashes.   Psych:  Oriented X3, memory intact for recent and remote, normally interactive, good eye contact, not anxious appearing, and not depressed appearing.    Diabetes Management Exam:    Foot Exam (with socks and/or shoes not present):       Sensory-Monofilament:          Left foot: normal          Right foot: normal   Impression & Recommendations:  Problem # 1:  LOW BACK PAIN (ICD-724.2) Given progression of low back  pain, it seems appropriate to re-image lumbar spine with MRI (last MRI 01/2007). Will also refer her again to neurosurgery (she was last seen a few years ago). Given her recent weight loss and possible progression of lumbar disease, benefits of surgical intervention may outweigh risks at this point. Will continue to manage considerable pain with carisoprodol, tramadol, hydromorphone, and gabapentin. The importance of maximizing pain control with regular use of gabapentin and using hydromorphone only sparingly for breakthrough pain was emphasized. Will refill hydromorphone #30 tablets but patient advised that this refill will have to last her until the end of the month (we will prescribe no more hydromorphone until 12/2010) -- she understands and agrees.   Her updated medication list for this problem includes:    Aspirin 81 Mg Tbec (Aspirin) .Marland Kitchen... Take 1 tablet by mouth once a day    Carisoprodol 350 Mg Tabs (Carisoprodol) .Marland Kitchen... Take 1 tablet by mouth four times a day as needed for muscle spasm    Tramadol Hcl 50 Mg Tabs (Tramadol hcl) .Marland Kitchen... Take 1/2 tablet by mouth every 6 hours as needed for back pain    Hydromorphone Hcl 2 Mg Tabs (Hydromorphone hcl) .Marland Kitchen... Take 1 tablet by mouth every 6 hours as needed for pain  Orders: Neurosurgeon Referral (Neurosurgeon) MRI without Contrast (MRI w/o Contrast)  Problem # 2:  HEMOCCULT POSITIVE STOOL (ICD-578.1) Currently undergoing work-up by gastroenterologist, Dr. Ewing Schlein. Underwent capsule endoscopy yesterday.   Problem # 3:  DIABETES MELLITUS, TYPE II (ICD-250.00) Well controlled. Continue current management.   Her updated medication list for this problem includes:    Enalapril Maleate 10 Mg Tabs (Enalapril maleate) .Marland Kitchen... Take 1 tablet by mouth once a day    Glucophage Xr 500 Mg Tb24 (Metformin hcl) .Marland Kitchen... Take 2 tablets by mouth each am and 1 tablet each pm    Aspirin 81 Mg Tbec (Aspirin) .Marland Kitchen... Take 1 tablet by mouth once a day  Orders: Capillary Blood  Glucose/CBG (04540)  Problem # 4:  HYPOTHYROIDISM (ICD-244.9) Will recheck TSH today.   Her updated medication list for this problem includes:    Synthroid 75 Mcg Tabs (Levothyroxine sodium) .Marland Kitchen... Take 1 tablet by mouth once a day  Orders: T-TSH (98119-14782)  Problem # 5:  OBESITY (ICD-278.00)  Has lost nearly 40 pounds over the last year. No weight measured today because patient was in too much pain to get out of wheelchair. Ongoing weight loss efforts were encouraged and reinforced.   Complete Medication List: 1)  Enalapril Maleate 10 Mg Tabs (Enalapril maleate) .... Take 1 tablet by mouth once a day 2)  Ventolin Hfa 108 (90 Base) Mcg/act Aers (Albuterol sulfate) .... Inhale 2 puffs four times a day  as needed 3)  Azmacort 100 Mcg/act Aers (Triamcinolone acetonide(inhal)) .... Inhale 2 puffs two times a day 4)  Xanax 1 Mg Tabs (Alprazolam) .... Take 1 tablet by mouth four times a day for anxiety 5)  Synthroid 75 Mcg Tabs (Levothyroxine sodium) .... Take 1 tablet by mouth once a day 6)  Glucophage Xr 500 Mg Tb24 (Metformin hcl) .... Take 2 tablets by mouth each am and 1 tablet each pm 7)  Nexium 40 Mg Cpdr (Esomeprazole magnesium) .... Take 1 capsule by mouth once a day 8)  Aspirin 81 Mg Tbec (Aspirin) .... Take 1 tablet by mouth once a day 9)  Flonase 50 Mcg/act Susp (Fluticasone propionate) .... Take 2 sprays in each nostril once a day 10)  Metamucil Powd (Psyllium powd) .... Take 1 tablespoonful in 8 ounces of water once a day to three times a day as needed for constipation 11)  Colace 100 Mg Caps (Docusate sodium) .... Take 1 capsule by mouth two times a day. 12)  Carisoprodol 350 Mg Tabs (Carisoprodol) .... Take 1 tablet by mouth four times a day as needed for muscle spasm 13)  Nystatin 100000 Unit/gm Powd (Nystatin) .... Apply to affected area two times a day for 10 days 14)  Hydrochlorothiazide 12.5 Mg Caps (Hydrochlorothiazide) .... Take 1 capsule by mouth once a day 15)   Tramadol Hcl 50 Mg Tabs (Tramadol hcl) .... Take 1/2 tablet by mouth every 6 hours as needed for back pain 16)  Fexofenadine Hcl 60 Mg Tabs (Fexofenadine hcl) .... Take 1 tablet by mouth two times a day for allergies 17)  Hydromorphone Hcl 2 Mg Tabs (Hydromorphone hcl) .... Take 1 tablet by mouth every 6 hours as needed for pain 18)  Ferrous Sulfate 325 (65 Fe) Mg Tabs (Ferrous sulfate) .... Take 1 tablet by mouth three times a day 19)  Easymax Test Strp (Glucose blood) .... Use to check blood sugar one time a day 20)  Erythromycin Ethylsuccinate 400 Mg Tabs (Erythromycin ethylsuccinate) .... Take 1 tablet by mouth two times a day 21)  Vesicare 5 Mg Tabs (Solifenacin succinate) .... Take 1 tablet by mouth once a day 22)  Ropinirole Hcl 0.5 Mg Tabs (Ropinirole hcl) .... Take 1 tablet by mouth three times a day 23)  Crestor 5 Mg Tabs (Rosuvastatin calcium) .... Take 1 tablet by mouth once a day 24)  Gabapentin 300 Mg Caps (Gabapentin) .... Take 1 tablet by mouth three times a day  Patient Instructions: 1)  Please schedule a follow-up appointment in 3 months. 2)  We will schedule you for MRI of lumbar spine and refer you to neurosurgery.  3)  Check your blood sugars regularly. If your readings are usually above : or below 70 you should contact our office. 4)  Use your hydromorphone sparingly and no more than one tablet every 6 hours. We cannot refill hydromorphone until February so the current prescription will need to last you until then.  Prescriptions: HYDROMORPHONE HCL 2 MG TABS (HYDROMORPHONE HCL) Take 1 tablet by mouth every 6 hours  as needed for pain  #30 x 0   Entered and Authorized by:   Whitney Post MD   Signed by:   Whitney Post MD on 11/29/2010   Method used:   Print then Give to Patient   RxID:   [id number]    Orders Added: 1)  Capillary Blood Glucose/CBG [82948] 2)  T-TSH [13244-01027] 3)  Neurosurgeon Referral [Neurosurgeon] 4)  MRI without Contrast [MRI w/o  Contrast] 5)  Est. Patient Level IV [25366]   Process Orders Check Orders Results:     [facility]: Check successful Tests Sent for requisitioning (November 29, 2010 11:24 AM):     11/29/2010: [facility] -- T-TSH [phone] (signed)     Prevention & Chronic Care Immunizations   Influenza vaccine: Fluvax MCR  (10/05/2010)   Influenza vaccine due: 07/21/2009    Tetanus booster: 08/21/2007: Td    Pneumococcal vaccine: Not documented  Other Screening   Pap smear: Interpretation:Negative for intraepithelial lesions or malignancy.     (04/05/2007)    Mammogram: ASSESSMENT: Negative - BI-RADS 1^MM DIGITAL SCREENING  (11/02/2010)   Mammogram due: 04/2010   Smoking status: quit  (11/29/2010)  Diabetes Mellitus   HgbA1C: 6.0  (10/05/2010)   HgbA1C action/deferral: Ordered  (03/02/2010)   Hemoglobin A1C due: 06/01/2010    Eye exam: No diabetic retinopathy.   Exam by Dr. Ernesto Rutherford  (12/10/2009)   Eye exam due: 12/2010    Foot exam: yes  (11/29/2010)   Foot exam action/deferral: Do today   High risk foot: Yes  (11/29/2010)   Foot care education: Done  (10/05/2010)   Foot exam due: 10/07/2009    Urine microalbumin/creatinine ratio: 7.0  (04/29/2010)   Urine microalbumin action/deferral: Ordered   Urine microalbumin/cr due: 04/21/2010    Diabetes flowsheet reviewed?: Yes   Progress toward A1C goal: At goal  Lipids   Total Cholesterol: 260  (10/05/2010)   Lipid panel action/deferral: Lipid Panel ordered   LDL: 157  (10/05/2010)   LDL Direct: 101  (07/07/2009)   HDL: 44  (10/05/2010)   Triglycerides: 297  (10/05/2010)   Lipid panel due: 05/24/2010    SGOT (AST): 21  (10/05/2010)   BMP action: Ordered   SGPT (ALT): 21  (10/05/2010)   Alkaline phosphatase: 70  (10/05/2010)   Total bilirubin: 0.5  (10/05/2010)   Liver panel due: 05/24/2010    Lipid flowsheet reviewed?: Yes   Progress toward LDL goal: Unchanged  Hypertension    Last Blood Pressure: 108 / 72  (11/29/2010)   Serum creatinine: 0.62  (10/05/2010)   BMP action: Ordered   Serum potassium 4.1  (10/05/2010)   Basic metabolic panel due: 09/01/2010    Hypertension flowsheet reviewed?: Yes   Progress toward BP goal: At goal  Self-Management Support :   Personal Goals (by the next clinic visit) :     Personal A1C goal: 7  (12/15/2009)     Personal blood pressure goal: 130/80  (12/15/2009)     Personal LDL goal: 100  (12/15/2009)    Patient will work on the following items until the next clinic visit to reach self-care goals:     Medications and monitoring: take my medicines every day, check my blood sugar, bring all of my medications to every visit, weigh myself weekly, examine my feet every day  (11/29/2010)     Eating: drink diet soda or water instead of juice or soda, eat more vegetables, use fresh or frozen vegetables, eat baked foods instead of  fried foods, eat fruit for snacks and desserts, limit or avoid alcohol  (11/29/2010)     Activity: take a 30 minute walk every day  (11/29/2010)     Home glucose monitoring frequency: 1 time daily  (04/20/2010)    Diabetes self-management support: Written self-care plan, Education handout, Resources for patients handout  (11/29/2010)   Diabetes care plan printed   Diabetes education handout printed    Hypertension self-management support: Written self-care plan, Education handout, Resources for patients handout  (11/29/2010)   Hypertension self-care plan printed.   Hypertension education handout printed    Lipid self-management support: Written self-care plan, Education handout, Resources for patients handout  (11/29/2010)   Lipid self-care plan printed.   Lipid education handout printed      Resource handout printed.   Last LDL:                                                 157 (10/05/2010 8:33:00 PM)        Diabetic Foot Exam Foot Inspection Is there a history of a foot ulcer?               No Is there a foot ulcer now?              No Can the patient see the bottom of their feet?          Yes Are the shoes appropriate in style and fit?          Yes Is there swelling or an abnormal foot shape?          No Are the toenails long?                No Are the toenails thick?                No Are the toenails ingrown?              No Is there heavy callous build-up?              No Is there a claw toe deformity?                          No Is there elevated skin temperature?            No Is there limited ankle dorsiflexion?            No Is there foot or ankle muscle weakness?            No Do you have pain in calf while walking?           No      Pulse Check          Right Foot          Left Foot Posterior Tibial:        2+            2+ Dorsalis Pedis:        2+            2+  High Risk Feet? Yes   10-g (5.07) Semmes-Weinstein Monofilament Test Performed by: Stanton Kidney Ditzler RN          Right Foot          Left Foot Visual Inspection  normal         normal Test Control      normal         normal Site 1         normal         normal Site 2         normal         normal Site 3         normal         normal Site 4         normal         normal Site 5         normal         normal Site 6         normal         normal Site 7         normal         normal Site 8         normal         normal Site 9         normal         normal Site 10         normal         normal  Impression      normal         normal  Process Orders Check Orders Results:     [facility]: Check successful Tests Sent for requisitioning (November 29, 2010 11:24 AM):     11/29/2010: [facility] -- T-TSH [phone] (signed)

## 2010-12-22 NOTE — Miscellaneous (Signed)
Summary: [facility]: RX  For Commode  [facility]: RX  For Commode   Imported By: Florinda Marker 11/30/2009 13:56:37  _____________________________________________________________________  External Attachment:    Type:   Image     Comment:   External Document

## 2010-12-22 NOTE — Medication Information (Signed)
Summary: HYDROMORPHONE  HYDROMORPHONE   Imported By: Margie Billet 09/13/2010 11:34:36  _____________________________________________________________________  External Attachment:    Type:   Image     Comment:   External Document

## 2010-12-22 NOTE — Letter (Signed)
Summary: [facility]  [facility]   Imported By: Shon Hough 09/23/2010 12:16:27  _____________________________________________________________________  External Attachment:    Type:   Image     Comment:   External Document

## 2010-12-22 NOTE — Medication Information (Signed)
Summary: HYDROMORPHONE  HYDROMORPHONE   Imported By: Margie Billet 11/01/2010 14:22:53  _____________________________________________________________________  External Attachment:    Type:   Image     Comment:   External Document

## 2010-12-22 NOTE — Letter (Signed)
Summary: [facility]  [facility]   Imported By: Margie Billet 03/07/2010 15:52:22  _____________________________________________________________________  External Attachment:    Type:   Image     Comment:   External Document

## 2010-12-22 NOTE — Assessment & Plan Note (Signed)
Summary: FU VISIT/DS   Vital Signs:  Patient profile:   50 year old female Height:      62 inches Temp:     97.8 degrees F oral Pulse rate:   83 / minute BP sitting:   132 / 83  (right arm)  Vitals Entered By: Filomena Jungling NT II (November 24, 2009 11:42 AM) CC: patient fell during holidays er visit can not walk or stand Is Patient Diabetic? Yes Did you bring your meter with you today? Yes Pain Assessment Patient in pain? yes     Location: neck,shoulder,back Intensity: 10 Type: aching Onset of pain  Constant CBG Result 128  Does patient need assistance? Functional Status Self care Ambulation Impaired:Risk for fall, Wheelchair   Primary Care Provider:  [name] MD  CC:  patient fell during holidays er visit can not walk or stand.  History of Present Illness: Patient returns for follow-up of her diabetes mellitus, hypertension, and other chronic medical problems.  She also has an acute complaint of left knee pain and swelling following a fall on November 15, 2009.  She was seen at the urgent care center and x-rays of her left knee then showed no fracture or dislocation.  She was advised to take Aleve and to use ice for symptomatic relief.  She reports no improvement in her pain, which he says is severe and renders her currently unable to walk.  She has not been checking her blood sugars recently because she bought a new glucose meter and says that it has not yet been calibrated.  She reports that she is compliant with her medications.  Preventive Screening-Counseling & Management  Alcohol-Tobacco     Smoking Status: quit     Smoking Cessation Counseling: yes     Year Quit: 1997     Passive Smoke Exposure: no  Caffeine-Diet-Exercise     Does Patient Exercise: yes     Type of exercise: WALKING     Times/week: 5-7W  Current medications: ENALAPRIL MALEATE 10 MG TABS (ENALAPRIL MALEATE) Take 2 tablets by mouth once a day ALBUTEROL 90 MCG/ACT AERS (ALBUTEROL) Inhale 2  puffs four times a day  as needed AZMACORT 100 MCG/ACT AERS (TRIAMCINOLONE ACETONIDE(INHAL)) Inhale 2 puffs two times a day XANAX 1 MG TABS (ALPRAZOLAM) Take 1 tablet by mouth four times a day for anxiety SYNTHROID 75 MCG TABS (LEVOTHYROXINE SODIUM) Take 1 tablet by mouth once a day GLUCOPHAGE XR 500 MG  TB24 (METFORMIN HCL) Take 2 tablets by mouth each AM and 1 tablet each PM LIPITOR 10 MG TABS (ATORVASTATIN CALCIUM) Take 1 tablet by mouth once a day NEXIUM 40 MG CPDR (ESOMEPRAZOLE MAGNESIUM) Take 1 capsule by mouth once a day ASPIRIN 81 MG TBEC (ASPIRIN) Take 1 tablet by mouth once a day FLONASE 50 MCG/ACT SUSP (FLUTICASONE PROPIONATE) Take 2 sprays in each nostril once a day METAMUCIL  POWD (PSYLLIUM POWD) Take 1 tablespoonful in 8 ounces of water once a day to three times a day as needed for constipation GLUCOTROL 5 MG TABS (GLIPIZIDE) Take 1 tablet by mouth two times a day COLACE 100 MG  CAPS (DOCUSATE SODIUM) Take 1 capsule by mouth two times a day. CARISOPRODOL 350 MG TABS (CARISOPRODOL) Take 1 tablet by mouth four times a day as needed for muscle spasm NYSTATIN 100000 UNIT/GM POWD (NYSTATIN) Apply to affected area two times a day for 10 days HYDROCHLOROTHIAZIDE 12.5 MG CAPS (HYDROCHLOROTHIAZIDE) Take 1 capsule by mouth once a day TRAMADOL HCL 50  MG TABS (TRAMADOL HCL) Take 1 tablet by mouth every 6 hours as needed for back pain FEXOFENADINE HCL 60 MG TABS (FEXOFENADINE HCL) Take 1 tablet by mouth two times a day for allergies GABAPENTIN 300 MG CAPS (GABAPENTIN) Take 1 capsule by mouth three times a day ERYTHROMYCIN ETHYLSUCCINATE 400 MG TABS (ERYTHROMYCIN ETHYLSUCCINATE) Take 1 tablet by mouth two times a day  Allergies: 1)  ! Codeine Sulfate (Codeine Sulfate) 2)  ! Penicillin V Potassium (Penicillin V Potassium) 3)  ! Tetracycline Hcl (Tetracycline Hcl) 4)  ! Minocycline Hcl (Minocycline Hcl) 5)  ! Darvocet-N 100 (Propoxyphene N-Apap)  Review of Systems      See HPI  Physical  Exam  General:  alert Lungs:  normal respiratory effort, normal breath sounds, no crackles, and no wheezes.   Heart:  normal rate, regular rhythm, no murmur, no gallop, and no rub.   Msk:  left knee swollen and tender, with full extension limited by pain   Impression & Recommendations:  Problem # 1:  KNEE PAIN, LEFT (ICD-719.46) Patient has severe left knee pain and swelling following a fall on December 27; her pain has been unrelieved by tramadol.  No fracture was seen on x-rays obtained in urgent care on the day of her injury.  She has multiple narcotic allergies documented in the past, and hydromorphone is one of the few narcotics she can tolerate.  The plan is to treat with hydromorphone 2 mg q.6 hours p.r.n. pain; a knee immobilizer was provided and applied by the orthopedic technician today in clinic; will order an MRI and refer to sports medicine for evaluation.  I will contact Dr. Darrick Penna to make sure he agrees that referral there is appropriate.  Her updated medication list for this problem includes:    Aspirin 81 Mg Tbec (Aspirin) .Marland Kitchen... Take 1 tablet by mouth once a day    Carisoprodol 350 Mg Tabs (Carisoprodol) .Marland Kitchen... Take 1 tablet by mouth four times a day as needed for muscle spasm    Tramadol Hcl 50 Mg Tabs (Tramadol hcl) .Marland Kitchen... Take 1 tablet by mouth every 6 hours as needed for back pain    Hydromorphone Hcl 2 Mg Tabs (Hydromorphone hcl) .Marland Kitchen... Take 1 tablet by mouth every 6 hours as needed for pain  Orders: T-CBC w/Diff ([id number]) Sports Medicine (Sports Med) MRI without Contrast (MRI w/o Contrast)  Problem # 2:  DIABETES MELLITUS, TYPE II (ICD-250.00) Patient's hemoglobin A1c of 7.0 is at goal, although slightly higher than at the time of her last visit.  The plan for now is to continue current regimen and recheck at the time of her next visit.  Her updated medication list for this problem includes:    Enalapril Maleate 10 Mg Tabs (Enalapril maleate) .Marland Kitchen... Take 2 tablets  by mouth once a day    Glucophage Xr 500 Mg Tb24 (Metformin hcl) .Marland Kitchen... Take 2 tablets by mouth each am and 1 tablet each pm    Aspirin 81 Mg Tbec (Aspirin) .Marland Kitchen... Take 1 tablet by mouth once a day    Glucotrol 5 Mg Tabs (Glipizide) .Marland Kitchen... Take 1 tablet by mouth two times a day  Labs Reviewed: Creat: 0.66 (04/21/2009)     Last Eye Exam: No diabetic retinopathy.   Visual acuity OD (best corrected):    20/25  Visual acuity OS (best corrected):     20/30 Intraocular pressure OD:     18 Intraocular pressure OS:     17 Exam by Molly Maduro L. Dione Booze, M.D.  (  02/04/2008) Reviewed HgBA1c results: 7.0 (11/24/2009)  6.6 (07/07/2009)  Orders: T-Hgb A1C (in-house) ([id number]) T-Comprehensive Metabolic Panel ([id number]) T- Capillary Blood Glucose (65784)  Problem # 3:  HYPERTENSION (ICD-401.9) Patient's blood pressure is essentially at goal; will continue current medication regimen.  Her updated medication list for this problem includes:    Enalapril Maleate 10 Mg Tabs (Enalapril maleate) .Marland Kitchen... Take 2 tablets by mouth once a day    Hydrochlorothiazide 12.5 Mg Caps (Hydrochlorothiazide) .Marland Kitchen... Take 1 capsule by mouth once a day  BP today: 132/83 Prior BP: 129/76 (07/07/2009)  Labs Reviewed: K+: 4.1 (04/21/2009) Creat: : 0.66 (04/21/2009)   Chol: 173 (07/07/2009)   HDL: 39 (07/07/2009)   LDL: 86 (07/07/2009)   TG: 239 (07/07/2009)  Problem # 4:  HYPOTHYROIDISM (ICD-244.9) Will check a TSH today, and continue current Synthroid dose pending that result.  Her updated medication list for this problem includes:    Synthroid 75 Mcg Tabs (Levothyroxine sodium) .Marland Kitchen... Take 1 tablet by mouth once a day  Labs Reviewed: TSH: 2.606 (04/21/2009)    HgBA1c: 7.0 (11/24/2009) Chol: 173 (07/07/2009)   HDL: 39 (07/07/2009)   LDL: 86 (07/07/2009)   TG: 239 (07/07/2009)  Orders: T-TSH ([id number])  Problem # 5:  ACNE, ROSACEA (ICD-695.3) The patient is currently on erythromycin prescribed by her dermatologist  for her rosacea.  There is a potential drug interaction with Lipitor, and I advised her to hold Lipitor while she is on erythromycin.  If the erythromycin will be a prolonged course, then we may have to revise her lipid medication regimen.  Problem # 6:  HYPERCHOLESTEROLEMIA (ICD-272.0) See number 5 above.  Will check a fasting lipid panel.  Her updated medication list for this problem includes:    Lipitor 10 Mg Tabs (Atorvastatin calcium) .Marland Kitchen... Take 1 tablet by mouth once a day  Labs Reviewed: SGOT: 37 (04/21/2009)   SGPT: 40 (04/21/2009)   HDL:39 (07/07/2009), 39 (04/21/2009)  LDL:86 (07/07/2009), See Comment mg/dL (32/44/0102)  VOZD:664 (07/07/2009), 235 (04/21/2009)  Trig:239 (07/07/2009), 425 (04/21/2009)  Orders: T-Lipid Profile ([id number])  Complete Medication List: 1)  Enalapril Maleate 10 Mg Tabs (Enalapril maleate) .... Take 2 tablets by mouth once a day 2)  Albuterol 90 Mcg/act Aers (Albuterol) .... Inhale 2 puffs four times a day  as needed 3)  Azmacort 100 Mcg/act Aers (Triamcinolone acetonide(inhal)) .... Inhale 2 puffs two times a day 4)  Xanax 1 Mg Tabs (Alprazolam) .... Take 1 tablet by mouth four times a day for anxiety 5)  Synthroid 75 Mcg Tabs (Levothyroxine sodium) .... Take 1 tablet by mouth once a day 6)  Glucophage Xr 500 Mg Tb24 (Metformin hcl) .... Take 2 tablets by mouth each am and 1 tablet each pm 7)  Lipitor 10 Mg Tabs (Atorvastatin calcium) .... Take 1 tablet by mouth once a day 8)  Nexium 40 Mg Cpdr (Esomeprazole magnesium) .... Take 1 capsule by mouth once a day 9)  Aspirin 81 Mg Tbec (Aspirin) .... Take 1 tablet by mouth once a day 10)  Flonase 50 Mcg/act Susp (Fluticasone propionate) .... Take 2 sprays in each nostril once a day 11)  Metamucil Powd (Psyllium powd) .... Take 1 tablespoonful in 8 ounces of water once a day to three times a day as needed for constipation 12)  Glucotrol 5 Mg Tabs (Glipizide) .... Take 1 tablet by mouth two times a day 13)   Colace 100 Mg Caps (Docusate sodium) .... Take 1 capsule by mouth two times a day. 14)  Carisoprodol 350 Mg Tabs (Carisoprodol) .... Take 1 tablet by mouth four times a day as needed for muscle spasm 15)  Nystatin 100000 Unit/gm Powd (Nystatin) .... Apply to affected area two times a day for 10 days 16)  Hydrochlorothiazide 12.5 Mg Caps (Hydrochlorothiazide) .... Take 1 capsule by mouth once a day 17)  Tramadol Hcl 50 Mg Tabs (Tramadol hcl) .... Take 1 tablet by mouth every 6 hours as needed for back pain 18)  Fexofenadine Hcl 60 Mg Tabs (Fexofenadine hcl) .... Take 1 tablet by mouth two times a day for allergies 19)  Gabapentin 300 Mg Caps (Gabapentin) .... Take 1 capsule by mouth three times a day 20)  Erythromycin Ethylsuccinate 400 Mg Tabs (Erythromycin ethylsuccinate) .... Take 1 tablet by mouth two times a day 21)  Hydromorphone Hcl 2 Mg Tabs (Hydromorphone hcl) .... Take 1 tablet by mouth every 6 hours as needed for pain  Other Orders: Flu Vaccine 12yrs + ([id number]) Administration Flu vaccine - MCR ([id number])  Patient Instructions: 1)  Please schedule a follow-up appointment in 2 weeks. 2)  Take hydromorphone (Dilaudid) 2 mg one tablet every six hours as needed for pain. 3)  An appointment will be scheduled for an MRI of your left knee and for evaluation in Sports Medicine clinic. 4)  Do not take Lipitor while taking erythromycin.  Prescriptions: HYDROMORPHONE HCL 2 MG TABS (HYDROMORPHONE HCL) Take 1 tablet by mouth every 6 hours as needed for pain  #60 x 0   Entered and Authorized by:   [name] MD   Signed by:   [name] MD on 11/24/2009   Method used:   Print then Give to Patient   RxID:   [id number] DILAUDID 2 MG TABS (HYDROMORPHONE HCL) Take 1 tablet by mouth every 6 hours  #60 x 0   Entered and Authorized by:   [name] MD   Signed by:   [name] MD on 11/24/2009   Method used:   Print then Give to Patient   RxID:   [id number]   Prevention & Chronic  Care Immunizations   Influenza vaccine: Fluvax 3+  (11/24/2009)   Influenza vaccine due: 07/21/2009    Tetanus booster: 08/21/2007: Td    Pneumococcal vaccine: Not documented  Other Screening   Pap smear: Interpretation:Negative for intraepithelial lesions or malignancy.     (04/05/2007)    Mammogram: ASSESSMENT: Negative - BI-RADS 1  (05/04/2009)   Mammogram due: 04/2010   Smoking status: quit  (11/24/2009)  Diabetes Mellitus   HgbA1C: 7.0  (11/24/2009)   HgbA1C action/deferral: Ordered  (07/07/2009)   Hemoglobin A1C due: 07/03/2007    Eye exam: No diabetic retinopathy.   Visual acuity OD (best corrected):    20/25  Visual acuity OS (best corrected):     20/30 Intraocular pressure OD:     18 Intraocular pressure OS:     17 Exam by Molly Maduro L. Dione Booze, M.D.   (02/04/2008)   Eye exam due: 02/2009    Foot exam: yes  (04/21/2009)   Foot exam action/deferral: Refused   High risk foot: Yes  (07/07/2009)   Foot care education: Done  (07/07/2009)   Foot exam due: 10/07/2009    Urine microalbumin/creatinine ratio: 9.2  (04/21/2009)   Urine microalbumin/cr due: 04/21/2010    Diabetes flowsheet reviewed?: Yes   Progress toward A1C goal: At goal  Lipids   Total Cholesterol: 173  (07/07/2009)   Lipid panel action/deferral: LDL Direct Ordered   LDL: 86  (07/07/2009)  LDL Direct: 101  (07/07/2009)   HDL: 39  (07/07/2009)   Triglycerides: 239  (07/07/2009)    SGOT (AST): 37  (04/21/2009)   SGPT (ALT): 40  (04/21/2009) CMP ordered    Alkaline phosphatase: 75  (04/21/2009)   Total bilirubin: 0.3  (04/21/2009)    Lipid flowsheet reviewed?: Yes   Progress toward LDL goal: Unchanged  Hypertension   Last Blood Pressure: 132 / 83  (11/24/2009)   Serum creatinine: 0.66  (04/21/2009)   Serum potassium 4.1  (04/21/2009) CMP ordered     Hypertension flowsheet reviewed?: Yes   Progress toward BP goal: Unchanged  Self-Management Support :   Personal Goals (by the next clinic  visit) :     Personal A1C goal: 7  (07/07/2009)     Personal blood pressure goal: 130/80  (07/07/2009)     Personal LDL goal: 100  (07/07/2009)    Patient will work on the following items until the next clinic visit to reach self-care goals:     Medications and monitoring: take my medicines every day, check my blood sugar  (11/24/2009)     Eating: eat more vegetables, use fresh or frozen vegetables, eat foods that are low in salt, eat baked foods instead of fried foods  (11/24/2009)     Home glucose monitoring frequency: 1 time daily  (07/07/2009)    Diabetes self-management support: Copy of home glucose meter record  (11/24/2009)    Hypertension self-management support: Written self-care plan  (07/07/2009)    Lipid self-management support: Written self-care plan  (07/07/2009)    Nursing Instructions: Give Flu vaccine today     Process Orders Check Orders Results:     [facility]: Check successful Tests Sent for requisitioning (November 27, 2009 12:56 PM):     11/24/2009: [facility] -- T-Comprehensive Metabolic Panel [[id number]] (signed)     11/24/2009: [facility] -- T-CBC w/Diff [[id number]] (signed)     11/24/2009: [facility] -- T-Lipid Profile [phone] (signed)     11/24/2009: [facility] -- T-TSH [phone] (signed)    Laboratory Results   Blood Tests   Date/Time Received: Ezra Sites (aama) November 24, 2009 12:01 PM   Date/Time Reported: Ezra Sites (aama)r  November 24, 2009 12:01 PM   HGBA1C: 7.0%   (Normal Range: Non-Diabetic - 3-6%   Control Diabetic - 6-8%) CBG Random:: 128mg /dL     Flu Vaccine Consent Questions     Do you have a history of severe allergic reactions to this vaccine? no    Any prior history of allergic reactions to egg and/or gelatin? no    Do you have a sensitivity to the preservative Thimersol? no    Do you have a past history of  Guillan-Barre Syndrome? no    Do you currently have an acute febrile illness? no    Have you ever had a severe reaction to latex? no    Vaccine information given and explained to patient? yes    Are you currently pregnant? no    Lot VHQION:[id number]   Exp Date:02/17/2010   Manufacturer: [employer]    Site Given  Left Deltoid IM Jennet Maduro RN  November 24, 2009 1:32 PM

## 2010-12-22 NOTE — Medication Information (Signed)
Summary: HYDROMORPHONE  HYDROMORPHONE   Imported By: Margie Billet 02/17/2010 12:01:21  _____________________________________________________________________  External Attachment:    Type:   Image     Comment:   External Document

## 2010-12-22 NOTE — Progress Notes (Signed)
Summary: refill/gg  Phone Note Refill Request  on February 14, 2010 3:04 PM  Refills Requested: Medication #1:  HYDROMORPHONE HCL 2 MG TABS Take 1 tablet by mouth every 6 hours as needed for pain   Last Refilled: 01/12/2010  Method Requested: Pick up at Office Initial call taken by: Merrie Roof RN,  February 14, 2010 3:05 PM  Follow-up for Phone Call        I am OK with this refill for # 30.  If patient needs to pick this up today, please see if the [facility] attending will write it since I am not in clinic this afternoon.  Otherwise I can write it tomorrow AM. Follow-up by: [name] MD,  February 14, 2010 4:20 PM  Additional Follow-up for Phone Call Additional follow up Details #1::        will print off rx. Additional Follow-up by: Blanch Media MD,  February 14, 2010 4:27 PM    Prescriptions: HYDROMORPHONE HCL 2 MG TABS (HYDROMORPHONE HCL) Take 1 tablet by mouth every 6 hours as needed for pain  #30 x 0   Entered and Authorized by:   Blanch Media MD   Signed by:   Blanch Media MD on 02/14/2010   Method used:   Print then Give to Patient   RxID:   [id number]

## 2010-12-22 NOTE — Assessment & Plan Note (Signed)
Summary: EST-1 MONTH F/U VISIT/CH   Vital Signs:  Patient profile:   50 year old female Height:      62 inches Weight:      199.03 pounds BMI:     36.53 Temp:     97.2 degrees F oral Pulse rate:   86 / minute BP sitting:   125 / 76  (right arm)  Vitals Entered By: Filomena Jungling NT II (October 05, 2010 11:23 AM) CC: checkup Is Patient Diabetic? Yes Did you bring your meter with you today? Yes Pain Assessment Patient in pain? yes     Location: back Intensity: 9 Type: aching Onset of pain  Chronic Nutritional Status BMI of > 30 = obese  Have you ever been in a relationship where you felt threatened, hurt or afraid?No   Does patient need assistance? Functional Status Self care Ambulation Impaired:Risk for fall Comments has a cane    Primary Care Provider:  [name] MD  CC:  checkup.  History of Present Illness: Patient returns for followup of her diabetes mellitus, hypertension, hyperlipidemia, and other chronic medical problems. She has been trying to lose weight by dieting this year, and has been successful in losing around 38 pounds since January. She reports that she is feeling much better having lost the weight. She still has chronic back pain and knee pain, and still sees her orthopedist Dr. Charlann Boxer for left knee pain.  She reports that she is compliant with her medications.  Her blood sugars have been well controlled.  Preventive Screening-Counseling & Management  Alcohol-Tobacco     Smoking Status: quit     Smoking Cessation Counseling: yes     Year Quit: 1997     Passive Smoke Exposure: no  Caffeine-Diet-Exercise     Does Patient Exercise: yes     Type of exercise: WALKING WHEN ABLE     Times/week: 5-7W  Current Medications (verified): 1)  Enalapril Maleate 10 Mg Tabs (Enalapril Maleate) .... Take 1 Tablet By Mouth Once A Day 2)  Ventolin Hfa 108 (90 Base) Mcg/act Aers (Albuterol Sulfate) .... Inhale 2 Puffs Four Times A Day  As Needed 3)  Azmacort 100  Mcg/act Aers (Triamcinolone Acetonide(Inhal)) .... Inhale 2 Puffs Two Times A Day 4)  Xanax 1 Mg Tabs (Alprazolam) .... Take 1 Tablet By Mouth Four Times A Day For Anxiety 5)  Synthroid 75 Mcg Tabs (Levothyroxine Sodium) .... Take 1 Tablet By Mouth Once A Day 6)  Glucophage Xr 500 Mg  Tb24 (Metformin Hcl) .... Take 2 Tablets By Mouth Each Am and 1 Tablet Each Pm 7)  Nexium 40 Mg Cpdr (Esomeprazole Magnesium) .... Take 1 Capsule By Mouth Once A Day 8)  Aspirin 81 Mg Tbec (Aspirin) .... Take 1 Tablet By Mouth Once A Day 9)  Flonase 50 Mcg/act Susp (Fluticasone Propionate) .... Take 2 Sprays in Each Nostril Once A Day 10)  Metamucil  Powd (Psyllium Powd) .... Take 1 Tablespoonful in 8 Ounces of Water Once A Day To Three Times A Day As Needed For Constipation 11)  Colace 100 Mg  Caps (Docusate Sodium) .... Take 1 Capsule By Mouth Two Times A Day. 12)  Carisoprodol 350 Mg Tabs (Carisoprodol) .... Take 1 Tablet By Mouth Four Times A Day As Needed For Muscle Spasm 13)  Nystatin 100000 Unit/gm Powd (Nystatin) .... Apply To Affected Area Two Times A Day For 10 Days 14)  Hydrochlorothiazide 12.5 Mg Caps (Hydrochlorothiazide) .... Take 1 Capsule By Mouth Once A Day  15)  Tramadol Hcl 50 Mg Tabs (Tramadol Hcl) .... Take 1/2 Tablet By Mouth Every 6 Hours As Needed For Back Pain 16)  Fexofenadine Hcl 60 Mg Tabs (Fexofenadine Hcl) .... Take 1 Tablet By Mouth Two Times A Day For Allergies 17)  Hydromorphone Hcl 2 Mg Tabs (Hydromorphone Hcl) .... Take 1 Tablet By Mouth Every 6 Hours As Needed For Pain 18)  Ferrous Sulfate 325 (65 Fe) Mg Tabs (Ferrous Sulfate) .... Take 1 Tablet By Mouth Three Times A Day 19)  Accu-Chek Compact Test Drum  Strp (Glucose Blood) .... Use To Check Blood Sugar One Time A Day 20)  Erythromycin Ethylsuccinate 400 Mg Tabs (Erythromycin Ethylsuccinate) .... Take 1 Tablet By Mouth Two Times A Day 21)  Vesicare 5 Mg Tabs (Solifenacin Succinate) .... Take 1 Tablet By Mouth Once A Day 22)   Ropinirole Hcl 0.5 Mg Tabs (Ropinirole Hcl) .... Take 1 Tablet By Mouth Three Times A Day  Allergies (verified): 1)  ! Codeine Sulfate (Codeine Sulfate) 2)  ! Penicillin V Potassium (Penicillin V Potassium) 3)  ! Tetracycline Hcl (Tetracycline Hcl) 4)  ! Minocycline Hcl (Minocycline Hcl) 5)  ! Darvocet-N 100  Physical Exam  General:  alert, no distress Lungs:  normal respiratory effort, normal breath sounds, no crackles, and no wheezes.   Heart:  normal rate, regular rhythm, no murmur, no gallop, and no rub.   Extremities:  no edema  Diabetes Management Exam:    Foot Exam (with socks and/or shoes not present):       Sensory-Monofilament:          Left foot: normal          Right foot: normal   Impression & Recommendations:  Problem # 1:  DIABETES MELLITUS, TYPE II (ICD-250.00) Patient's diabetes is well controlled on current regimen; will continue as below.  Her updated medication list for this problem includes:    Enalapril Maleate 10 Mg Tabs (Enalapril maleate) .Marland Kitchen... Take 1 tablet by mouth once a day    Glucophage Xr 500 Mg Tb24 (Metformin hcl) .Marland Kitchen... Take 2 tablets by mouth each am and 1 tablet each pm    Aspirin 81 Mg Tbec (Aspirin) .Marland Kitchen... Take 1 tablet by mouth once a day  Orders: T-Hgb A1C (in-house) (16109UE) T- Capillary Blood Glucose (45409)  Labs Reviewed: Creat: 0.57 (05/11/2010)     Last Eye Exam: No diabetic retinopathy.   Exam by Dr. Ernesto Rutherford (12/10/2009) Reviewed HgBA1c results: 6.0 (10/05/2010)  6.6 (04/20/2010)  Problem # 2:  HYPERTENSION (ICD-401.9) Patient's blood pressure is at goal on current regimen; will continue as below.  Her updated medication list for this problem includes:    Enalapril Maleate 10 Mg Tabs (Enalapril maleate) .Marland Kitchen... Take 1 tablet by mouth once a day    Hydrochlorothiazide 12.5 Mg Caps (Hydrochlorothiazide) .Marland Kitchen... Take 1 capsule by mouth once a day  BP today: 125/76 Prior BP: 140/83 (08/26/2010)  Labs Reviewed: K+: 3.9  (05/11/2010) Creat: : 0.57 (05/11/2010)   Chol: 239 (04/20/2010)   HDL: 39 (04/20/2010)   LDL: 124 (04/20/2010)   TG: 378 (04/20/2010)  Problem # 3:  HYPERCHOLESTEROLEMIA (ICD-272.0) Patient has been off of Lipitor for more than 6 months because of potential interaction with the erythromycin that she is taking for her rosacea. The plan is to check a lipid panel; if indicated, then will switch to an alternative statin such as Crestor which has less risk of interaction with erythromycin.  Orders: T-Lipid Profile [phone])  Labs Reviewed:  SGOT: 19 (05/11/2010)   SGPT: 22 (05/11/2010)   HDL:39 (04/20/2010), 34 (11/24/2009)  LDL:124 (04/20/2010), 66 (84/69/6295)  Chol:239 (04/20/2010), 146 (11/24/2009)  Trig:378 (04/20/2010), 231 (11/24/2009)  Problem # 4:  HEMOCCULT POSITIVE STOOL (ICD-578.1) Patient is currently being evaluated by her gastroenterologist Dr. Leary Roca.  Problem # 5:  LOW BACK PAIN (ICD-724.2) Patient has continued problems with chronic low back pain and left knee pain. The plan is to continue her current analgesic regimen, which is providing reasonable pain control. I advised her as before to use the hydromorphone sparingly and only for pain that is unrelieved by Tylenol or acetaminophen.  The following medications were removed from the medication list:    Acetaminophen-codeine #3 300-30 Mg Tabs (Acetaminophen-codeine) .Marland Kitchen... 1 tablet every 6 hrs as needed for body ache and back pain Her updated medication list for this problem includes:    Aspirin 81 Mg Tbec (Aspirin) .Marland Kitchen... Take 1 tablet by mouth once a day    Carisoprodol 350 Mg Tabs (Carisoprodol) .Marland Kitchen... Take 1 tablet by mouth four times a day as needed for muscle spasm    Tramadol Hcl 50 Mg Tabs (Tramadol hcl) .Marland Kitchen... Take 1/2 tablet by mouth every 6 hours as needed for back pain    Hydromorphone Hcl 2 Mg Tabs (Hydromorphone hcl) .Marland Kitchen... Take 1 tablet by mouth every 6 hours as needed for pain  Problem # 6:  KNEE PAIN, LEFT  (ICD-719.46) See #5 above.  The following medications were removed from the medication list:    Acetaminophen-codeine #3 300-30 Mg Tabs (Acetaminophen-codeine) .Marland Kitchen... 1 tablet every 6 hrs as needed for body ache and back pain Her updated medication list for this problem includes:    Aspirin 81 Mg Tbec (Aspirin) .Marland Kitchen... Take 1 tablet by mouth once a day    Carisoprodol 350 Mg Tabs (Carisoprodol) .Marland Kitchen... Take 1 tablet by mouth four times a day as needed for muscle spasm    Tramadol Hcl 50 Mg Tabs (Tramadol hcl) .Marland Kitchen... Take 1/2 tablet by mouth every 6 hours as needed for back pain    Hydromorphone Hcl 2 Mg Tabs (Hydromorphone hcl) .Marland Kitchen... Take 1 tablet by mouth every 6 hours as needed for pain  Problem # 7:  OBESITY (ICD-278.00) Patient has done an excellent job losing weight this year, and I encouraged her to continue her efforts.  Ht: 62 (10/05/2010)   Wt: 199.03 (10/05/2010)   BMI: 36.53 (10/05/2010)  Problem # 8:  ANXIETY DISORDER (ICD-300.00) This is managed by patient's psychiatrist.  She appears to be doing well.  Her updated medication list for this problem includes:    Xanax 1 Mg Tabs (Alprazolam) .Marland Kitchen... Take 1 tablet by mouth four times a day for anxiety  Medications Added to Medication List This Visit: 1)  Erythromycin Ethylsuccinate 400 Mg Tabs (Erythromycin ethylsuccinate) .... Take 1 tablet by mouth two times a day 2)  Vesicare 5 Mg Tabs (Solifenacin succinate) .... Take 1 tablet by mouth once a day 3)  Ropinirole Hcl 0.5 Mg Tabs (Ropinirole hcl) .... Take 1 tablet by mouth three times a day  Complete Medication List: 1)  Enalapril Maleate 10 Mg Tabs (Enalapril maleate) .... Take 1 tablet by mouth once a day 2)  Ventolin Hfa 108 (90 Base) Mcg/act Aers (Albuterol sulfate) .... Inhale 2 puffs four times a day  as needed 3)  Azmacort 100 Mcg/act Aers (Triamcinolone acetonide(inhal)) .... Inhale 2 puffs two times a day 4)  Xanax 1 Mg Tabs (Alprazolam) .... Take 1 tablet by  mouth four  times a day for anxiety 5)  Synthroid 75 Mcg Tabs (Levothyroxine sodium) .... Take 1 tablet by mouth once a day 6)  Glucophage Xr 500 Mg Tb24 (Metformin hcl) .... Take 2 tablets by mouth each am and 1 tablet each pm 7)  Nexium 40 Mg Cpdr (Esomeprazole magnesium) .... Take 1 capsule by mouth once a day 8)  Aspirin 81 Mg Tbec (Aspirin) .... Take 1 tablet by mouth once a day 9)  Flonase 50 Mcg/act Susp (Fluticasone propionate) .... Take 2 sprays in each nostril once a day 10)  Metamucil Powd (Psyllium powd) .... Take 1 tablespoonful in 8 ounces of water once a day to three times a day as needed for constipation 11)  Colace 100 Mg Caps (Docusate sodium) .... Take 1 capsule by mouth two times a day. 12)  Carisoprodol 350 Mg Tabs (Carisoprodol) .... Take 1 tablet by mouth four times a day as needed for muscle spasm 13)  Nystatin 100000 Unit/gm Powd (Nystatin) .... Apply to affected area two times a day for 10 days 14)  Hydrochlorothiazide 12.5 Mg Caps (Hydrochlorothiazide) .... Take 1 capsule by mouth once a day 15)  Tramadol Hcl 50 Mg Tabs (Tramadol hcl) .... Take 1/2 tablet by mouth every 6 hours as needed for back pain 16)  Fexofenadine Hcl 60 Mg Tabs (Fexofenadine hcl) .... Take 1 tablet by mouth two times a day for allergies 17)  Hydromorphone Hcl 2 Mg Tabs (Hydromorphone hcl) .... Take 1 tablet by mouth every 6 hours as needed for pain 18)  Ferrous Sulfate 325 (65 Fe) Mg Tabs (Ferrous sulfate) .... Take 1 tablet by mouth three times a day 19)  Accu-chek Compact Test Drum Strp (Glucose blood) .... Use to check blood sugar one time a day 20)  Erythromycin Ethylsuccinate 400 Mg Tabs (Erythromycin ethylsuccinate) .... Take 1 tablet by mouth two times a day 21)  Vesicare 5 Mg Tabs (Solifenacin succinate) .... Take 1 tablet by mouth once a day 22)  Ropinirole Hcl 0.5 Mg Tabs (Ropinirole hcl) .... Take 1 tablet by mouth three times a day  Other Orders: T-Comprehensive Metabolic Panel  ([id number]) Influenza Vaccine MCR (09811)  Patient Instructions: 1)  Please schedule a follow-up appointment in 3 months. 2)  Please schedule an appointment for your screening mammogram. 3)  Please schedule an appointment for your Pap smear.  Prescriptions: FEXOFENADINE HCL 60 MG TABS (FEXOFENADINE HCL) Take 1 tablet by mouth two times a day for allergies  #60 Tablet x 6   Entered and Authorized by:   [name] MD   Signed by:   [name] MD on 10/06/2010   Method used:   Electronically to        [facility]  [address] [phone]* (retail)       [address]       [place], [place]  [address]       Ph: [phone]       Fax: [phone]   RxID:   [id number] HYDROCHLOROTHIAZIDE 12.5 MG CAPS (HYDROCHLOROTHIAZIDE) Take 1 capsule by mouth once a day  #31 x 6   Entered and Authorized by:   [name] MD   Signed by:   [name] MD on 10/06/2010   Method used:   Electronically to        [facility]  [address] [phone] (retail)       [address]  [place], [place]  [address]       Ph: [phone]       Fax: [phone]   RxID:   [id number] CARISOPRODOL 350 MG TABS (CARISOPRODOL) Take 1 tablet by mouth four times a day as needed for muscle spasm  #90 Tablet x 2   Entered and Authorized by:   [name] MD   Signed by:   [name] MD on 10/06/2010   Method used:   Electronically to        [facility]  [address] [phone]* (retail)       [address]       [place], [place]  [address]       Ph: [phone]       Fax: [phone]   RxID:   [id number] FLONASE 50 MCG/ACT SUSP (FLUTICASONE PROPIONATE) Take 2 sprays in each nostril once a day  # 74month sup x 3   Entered and Authorized by:   [name] MD   Signed by:   [name] MD on 10/06/2010   Method used:   Electronically to        [facility]  [address] [phone]* (retail)       [address]       [place], [place]   [address]       Ph: [phone]       Fax: [phone]   RxID:   [id number] GLUCOPHAGE XR 500 MG  TB24 (METFORMIN HCL) Take 2 tablets by mouth each AM and 1 tablet each PM  #90 x 8   Entered and Authorized by:   [name] MD   Signed by:   [name] MD on 10/06/2010   Method used:   Electronically to        [facility]  [address] [phone]* (retail)       [address]       [place], [place]  [address]       Ph: [phone]       Fax: [phone]   RxID:   [id number] SYNTHROID 75 MCG TABS (LEVOTHYROXINE SODIUM) Take 1 tablet by mouth once a day Brand medically necessary #31 x 6   Entered and Authorized by:   [name] MD   Signed by:   [name] MD on 10/06/2010   Method used:   Electronically to        [facility]  [address] [phone]* (retail)       [address]       [place], [place]  [address]       Ph: [phone]       Fax: [phone]   RxID:   [id number] AZMACORT 100 MCG/ACT AERS (TRIAMCINOLONE ACETONIDE(INHAL)) Inhale 2 puffs two times a day  #1 x 11   Entered and Authorized by:   [name] MD   Signed by:   [name] MD on 10/06/2010   Method used:   Electronically to        [facility]  [address] [phone]* (retail)       [address]       [place], [place]  [address]       Ph: [phone]  Fax: [phone]   RxID:   [id number] VENTOLIN HFA 108 (90 BASE) MCG/ACT AERS (ALBUTEROL SULFATE) Inhale 2 puffs four times a day  as needed  #1 x 11   Entered and Authorized by:   [name] MD   Signed by:   [name] MD on 10/06/2010   Method used:   Electronically to        [facility]  [address] [phone]* (retail)       [address]       [place], [place]  [address]       Ph: [phone]       Fax: [phone]   RxID:   [id number] HYDROMORPHONE HCL 2 MG TABS (HYDROMORPHONE HCL) Take 1 tablet by mouth every 6 hours as needed for pain  #30 x 0   Entered and  Authorized by:   [name] MD   Signed by:   [name] MD on 10/05/2010   Method used:   Print then Give to Patient   RxID:   [id number]    Orders Added: 1)  T-Hgb A1C (in-house) [60630ZS] 2)  T-Lipid Profile [01093-23557] 3)  T-Comprehensive Metabolic Panel [[id number]] 4)  Influenza Vaccine MCR [00025] 5)  T- Capillary Blood Glucose [82948] 6)  Est. Patient Level III [62376]   Immunizations Administered:  Influenza Vaccine # 1:    Vaccine Type: Fluvax MCR    Site: left deltoid    Mfr: GlaxoSmithKline    Dose: 0.5 ml    Route: IM    Given by: Angelina Ok RN    Exp. Date: 05/20/2011    Lot #: [id number]    VIS given: 06/14/10 version given October 05, 2010.  Flu Vaccine Consent Questions:    Do you have a history of severe allergic reactions to this vaccine? no    Any prior history of allergic reactions to egg and/or gelatin? no    Do you have a sensitivity to the preservative Thimersol? no    Do you have a past history of Guillan-Barre Syndrome? no    Do you currently have an acute febrile illness? no    Have you ever had a severe reaction to latex? no    Vaccine information given and explained to patient? yes    Are you currently pregnant? no   Immunizations Administered:  Influenza Vaccine # 1:    Vaccine Type: Fluvax MCR    Site: left deltoid    Mfr: GlaxoSmithKline    Dose: 0.5 ml    Route: IM    Given by: Angelina Ok RN    Exp. Date: 05/20/2011    Lot #: [id number]    VIS given: 06/14/10 version given October 05, 2010.  Prevention & Chronic Care Immunizations   Influenza vaccine: Fluvax MCR  (10/05/2010)   Influenza vaccine due: 07/21/2009    Tetanus booster: 08/21/2007: Td    Pneumococcal vaccine: Not documented    Immunization comments: Reports prior Pneumovax  Other Screening   Pap smear: Interpretation:Negative for intraepithelial lesions or malignancy.     (04/05/2007)    Mammogram: ASSESSMENT: Negative - BI-RADS 1   (05/04/2009)   Mammogram due: 04/2010  Reports requested:   Last Pap report requested.  Smoking status: quit  (10/05/2010)  Diabetes Mellitus   HgbA1C: 6.0  (10/05/2010)   HgbA1C action/deferral: Ordered  (03/02/2010)   Hemoglobin A1C due: 06/01/2010    Eye exam: No diabetic retinopathy.   Exam by Dr.  Ernesto Rutherford  (12/10/2009)   Eye exam due: 12/2010    Foot exam: yes  (10/05/2010)   Foot exam action/deferral: Do today   High risk foot: Yes  (10/05/2010)   Foot care education: Done  (10/05/2010)   Foot exam due: 10/07/2009    Urine microalbumin/creatinine ratio: 7.0  (04/29/2010)   Urine microalbumin action/deferral: Ordered   Urine microalbumin/cr due: 04/21/2010    Diabetes flowsheet reviewed?: Yes   Progress toward A1C goal: At goal  Lipids   Total Cholesterol: 239  (04/20/2010)   Lipid panel action/deferral: Lipid Panel ordered   LDL: 124  (04/20/2010)   LDL Direct: 101  (07/07/2009)   HDL: 39  (04/20/2010)   Triglycerides: 378  (04/20/2010)   Lipid panel due: 05/24/2010    SGOT (AST): 19  (05/11/2010)   BMP action: Ordered   SGPT (ALT): 22  (05/11/2010) CMP ordered    Alkaline phosphatase: 71  (05/11/2010)   Total bilirubin: 0.3  (05/11/2010)   Liver panel due: 05/24/2010    Lipid flowsheet reviewed?: Yes   Progress toward LDL goal: Unchanged  Hypertension   Last Blood Pressure: 125 / 76  (10/05/2010)   Serum creatinine: 0.57  (05/11/2010)   BMP action: Ordered   Serum potassium 3.9  (05/11/2010) CMP ordered    Basic metabolic panel due: 09/01/2010    Hypertension flowsheet reviewed?: Yes   Progress toward BP goal: At goal  Self-Management Support :   Personal Goals (by the next clinic visit) :     Personal A1C goal: 7  (12/15/2009)     Personal blood pressure goal: 130/80  (12/15/2009)     Personal LDL goal: 100  (12/15/2009)    Patient will work on the following items until the next clinic visit to reach self-care goals:     Medications and  monitoring: take my medicines every day, check my blood sugar, bring all of my medications to every visit, examine my feet every day  (10/05/2010)     Eating: eat more vegetables, eat foods that are low in salt, eat fruit for snacks and desserts  (10/05/2010)     Activity: take a 30 minute walk every day  (10/05/2010)     Home glucose monitoring frequency: 1 time daily  (04/20/2010)    Diabetes self-management support: Education handout, Resources for patients handout  (10/05/2010)   Diabetes education handout printed    Hypertension self-management support: Education handout, Resources for patients handout  (10/05/2010)   Hypertension education handout printed    Lipid self-management support: Education handout, Resources for patients handout  (10/05/2010)     Lipid education handout printed      Resource handout printed.   Nursing Instructions: Diabetic foot exam today Give Flu vaccine today Request report of last Pap    Diabetic Foot Exam Foot Inspection Is there a history of a foot ulcer?              No Is there a foot ulcer now?              No Can the patient see the bottom of their feet?          Yes Are the shoes appropriate in style and fit?          Yes Is there swelling or an abnormal foot shape?          No Are the toenails long?  No Are the toenails thick?                No Are the toenails ingrown?              No Is there heavy callous build-up?              No Is there pain in the calf muscle (Intermittent claudication) when walking?    NoIs there a claw toe deformity?              No Is there elevated skin temperature?            No Is there limited ankle dorsiflexion?            No Is there foot or ankle muscle weakness?            No  Diabetic Foot Care Education Patient educated on appropriate care of diabetic feet.  Pulse Check          Right Foot          Left Foot Posterior Tibial:        normal            normal Dorsalis Pedis:         normal            normal Comments: callous on heels High Risk Feet? Yes   10-g (5.07) Semmes-Weinstein Monofilament Test Performed by: Filomena Jungling NT II          Right Foot          Left Foot Visual Inspection               Test Control      normal         normal Site 1         normal         normal Site 2         normal         normal Site 3         normal         normal Site 4         normal         normal Site 5         normal         normal Site 6         normal         normal Site 7         normal         normal Site 8         normal         normal Site 9         normal         normal Site 10         normal         normal  Impression      normal         normal   Laboratory Results   Blood Tests   Date/Time Received: October 05, 2010 11:36 AM  Date/Time Reported: Alric Quan  October 05, 2010 11:36 AM   HGBA1C: 6.0%   (Normal Range: Non-Diabetic - 3-6%   Control Diabetic - 6-8%) CBG Fasting:: 127mg /dL    Process Orders Check Orders Results:     [facility]: Check successful Tests Sent for requisitioning (October 07, 2010 2:38 PM):     10/05/2010:  [facility] -- T-Lipid Profile [phone] (signed)     10/05/2010: [facility] -- T-Comprehensive Metabolic Panel [phone] (signed)

## 2010-12-22 NOTE — Assessment & Plan Note (Signed)
Summary: dizziness x 1 month, weak,tired/pcp-Jovani Flury/hla   Vital Signs:  Patient profile:   50 year old female Height:      62 inches (157.48 cm) Weight:      206.3 pounds (94.91 kg) BMI:     37.87 Temp:     97.0 degrees F (36.11 degrees C) oral Pulse rate:   80 / minute BP sitting:   117 / 71  (left arm) Cuff size:   large  Vitals Entered By: Theotis Barrio NT II (May 11, 2010 9:56 AM) CC: N/V / DIZZINESS /  HEADACHE / FEELING VERY TIRED / FEELS WEAK Is Patient Diabetic? Yes Did you bring your meter with you today? Yes Pain Assessment Patient in pain? yes     Location: BACK/NECK/ L-LEG / HEAD Intensity:        8 Type: ACHING/ SHARP / TROBS Onset of pain  FOR ABOUT  WEEK  AND  HALF Nutritional Status BMI of > 30 = obese CBG Result 163  Have you ever been in a relationship where you felt threatened, hurt or afraid?No   Does patient need assistance? Functional Status Self care Ambulation Normal Comments PATIENT HAS BRACE ON LEFT LEG /  WALKS WITH THE ASSIST OF A CANE    Primary Care Provider:  [name] MD  CC:  N/V / DIZZINESS /  HEADACHE / FEELING VERY TIRED / FEELS WEAK.  History of Present Illness: Patient presents with a complaint of feeling dizzy for the past week and a half. She reports feeling weak, nauseated, and "swimmy headed"; she also reports some pain in the back of her head and neck. Her dizziness is vaguely described, although there does appear to be some sensation of movement with this symptom.  She has not had low blood sugars during these episodes. She reports that she is compliant with her medications. She denies any associated neurologic deficits.  Preventive Screening-Counseling & Management  Alcohol-Tobacco     Smoking Status: quit     Smoking Cessation Counseling: yes     Year Quit: 1997     Passive Smoke Exposure: no  Caffeine-Diet-Exercise     Does Patient Exercise: yes     Type of exercise: WALKING WHEN ABLE     Times/week:  5-7W  Allergies: 1)  ! Codeine Sulfate (Codeine Sulfate) 2)  ! Penicillin V Potassium (Penicillin V Potassium) 3)  ! Tetracycline Hcl (Tetracycline Hcl) 4)  ! Minocycline Hcl (Minocycline Hcl) 5)  ! Darvocet-N 100  Review of Systems CV:  Complains of lightheadness. GI:  Complains of nausea; one episode of vomiting. GU:  Complains of urinary frequency; denies dysuria.  Physical Exam  General:  alert, no distress Lungs:  normal respiratory effort, normal breath sounds, no crackles, and no wheezes.   Heart:  normal rate, regular rhythm, no murmur, no gallop, and no rub.   Abdomen:  soft, non-tender, normal bowel sounds, no hepatomegaly, and no splenomegaly.   Extremities:  no edema   Impression & Recommendations:  Problem # 1:  DIZZINESS (ICD-780.4) Patient symptoms are vaguely described, and her exam is unremarkable. She has no subjective focal neurologic deficits. I am concerned that her symptoms may be due to an interaction between her erythromycin which she takes for rosacea and other medications including specifically tramadol. The plan is to decrease tramadol to a dose of 25 milligrams q.6 hours p.r.n. pain and have her hold the erythromycin for now.  If her symptoms improve off of erythromycin and with a lower  dose of tramadol, then this would support a drug interaction.  Her updated medication list for this problem includes:    Fexofenadine Hcl 60 Mg Tabs (Fexofenadine hcl) .Marland Kitchen... Take 1 tablet by mouth two times a day for allergies  Problem # 2:  DIABETES MELLITUS, TYPE II (ICD-250.00) Patient's A1c is at goal on current regimen; will continue current medications.  Her updated medication list for this problem includes:    Enalapril Maleate 10 Mg Tabs (Enalapril maleate) .Marland Kitchen... Take 1 tablet by mouth once a day    Glucophage Xr 500 Mg Tb24 (Metformin hcl) .Marland Kitchen... Take 2 tablets by mouth each am and 1 tablet each pm    Aspirin 81 Mg Tbec (Aspirin) .Marland Kitchen... Take 1 tablet by mouth  once a day  Orders: Capillary Blood Glucose/CBG (04540) T-Urinalysis ([id number])  Labs Reviewed: Creat: 0.61 (04/29/2010)     Last Eye Exam: No diabetic retinopathy.   Exam by Dr. Ernesto Rutherford (12/10/2009) Reviewed HgBA1c results: 6.6 (04/20/2010)  6.3 (03/02/2010)  Problem # 3:  HYPERTENSION (ICD-401.9) Patient's blood pressure is well controlled on current regimen.  Plan is to continue current antihypertensive medications, and follow up blood pressure at next visit.   Her updated medication list for this problem includes:    Enalapril Maleate 10 Mg Tabs (Enalapril maleate) .Marland Kitchen... Take 1 tablet by mouth once a day    Hydrochlorothiazide 12.5 Mg Caps (Hydrochlorothiazide) .Marland Kitchen... Take 1 capsule by mouth once a day  BP today: 117/71 Prior BP: 101/77 (04/28/2010)  Labs Reviewed: K+: 3.7 (04/29/2010) Creat: : 0.61 (04/29/2010)   Chol: 239 (04/20/2010)   HDL: 39 (04/20/2010)   LDL: 124 (04/20/2010)   TG: 378 (04/20/2010)  Complete Medication List: 1)  Enalapril Maleate 10 Mg Tabs (Enalapril maleate) .... Take 1 tablet by mouth once a day 2)  Ventolin Hfa 108 (90 Base) Mcg/act Aers (Albuterol sulfate) .... Inhale 2 puffs four times a day  as needed 3)  Azmacort 100 Mcg/act Aers (Triamcinolone acetonide(inhal)) .... Inhale 2 puffs two times a day 4)  Xanax 1 Mg Tabs (Alprazolam) .... Take 1 tablet by mouth four times a day for anxiety 5)  Synthroid 75 Mcg Tabs (Levothyroxine sodium) .... Take 1 tablet by mouth once a day 6)  Glucophage Xr 500 Mg Tb24 (Metformin hcl) .... Take 2 tablets by mouth each am and 1 tablet each pm 7)  Lipitor 10 Mg Tabs (Atorvastatin calcium) .... Take 1 tablet by mouth once a day 8)  Nexium 40 Mg Cpdr (Esomeprazole magnesium) .... Take 1 capsule by mouth once a day 9)  Aspirin 81 Mg Tbec (Aspirin) .... Take 1 tablet by mouth once a day 10)  Flonase 50 Mcg/act Susp (Fluticasone propionate) .... Take 2 sprays in each nostril once a day 11)  Metamucil Powd  (Psyllium powd) .... Take 1 tablespoonful in 8 ounces of water once a day to three times a day as needed for constipation 12)  Colace 100 Mg Caps (Docusate sodium) .... Take 1 capsule by mouth two times a day. 13)  Carisoprodol 350 Mg Tabs (Carisoprodol) .... Take 1 tablet by mouth four times a day as needed for muscle spasm 14)  Nystatin 100000 Unit/gm Powd (Nystatin) .... Apply to affected area two times a day for 10 days 15)  Hydrochlorothiazide 12.5 Mg Caps (Hydrochlorothiazide) .... Take 1 capsule by mouth once a day 16)  Tramadol Hcl 50 Mg Tabs (Tramadol hcl) .... Take 1/2 tablet by mouth every 6 hours as needed for back pain  17)  Fexofenadine Hcl 60 Mg Tabs (Fexofenadine hcl) .... Take 1 tablet by mouth two times a day for allergies 18)  Gabapentin 300 Mg Caps (Gabapentin) .... Take 1 capsule by mouth four times a day 19)  Hydromorphone Hcl 2 Mg Tabs (Hydromorphone hcl) .... Take 1 tablet by mouth every 6 hours as needed for pain 20)  Ferrous Sulfate 325 (65 Fe) Mg Tabs (Ferrous sulfate) .... Take 1 tablet by mouth three times a day  Other Orders: T-Comprehensive Metabolic Panel ([id number])  Patient Instructions: 1)  Please schedule a follow-up appointment in 2 weeks. 2)  Stop erythromycin for now. 3)  Decrease tramadol 50 mg to 1/2 tablet every 6 hours as needed for pain. 4)  call or return if symptoms worsen.  Prevention & Chronic Care Immunizations   Influenza vaccine: Fluvax 3+  (11/24/2009)   Influenza vaccine due: 07/21/2009    Tetanus booster: 08/21/2007: Td    Pneumococcal vaccine: Not documented  Other Screening   Pap smear: Interpretation:Negative for intraepithelial lesions or malignancy.     (04/05/2007)    Mammogram: ASSESSMENT: Negative - BI-RADS 1  (05/04/2009)   Mammogram due: 04/2010   Smoking status: quit  (05/11/2010)  Diabetes Mellitus   HgbA1C: 6.6  (04/20/2010)   HgbA1C action/deferral: Ordered  (03/02/2010)   Hemoglobin A1C due: 06/01/2010     Eye exam: No diabetic retinopathy.   Exam by Dr. Ernesto Rutherford  (12/10/2009)   Eye exam due: 12/2010    Foot exam: yes  (04/20/2010)   Foot exam action/deferral: Do today   High risk foot: Yes  (07/07/2009)   Foot care education: Done  (04/20/2010)   Foot exam due: 10/07/2009    Urine microalbumin/creatinine ratio: 7.0  (04/29/2010)   Urine microalbumin action/deferral: Ordered   Urine microalbumin/cr due: 04/21/2010  Lipids   Total Cholesterol: 239  (04/20/2010)   Lipid panel action/deferral: LDL Direct Ordered   LDL: 244  (04/20/2010)   LDL Direct: 101  (07/07/2009)   HDL: 39  (04/20/2010)   Triglycerides: 378  (04/20/2010)   Lipid panel due: 05/24/2010    SGOT (AST): 17  (04/29/2010)   SGPT (ALT): 25  (04/29/2010) CMP ordered    Alkaline phosphatase: 72  (04/29/2010)   Total bilirubin: 0.4  (04/29/2010)   Liver panel due: 05/24/2010  Hypertension   Last Blood Pressure: 117 / 71  (05/11/2010)   Serum creatinine: 0.61  (04/29/2010)   Serum potassium 3.7  (04/29/2010) CMP ordered    Basic metabolic panel due: 09/01/2010  Self-Management Support :   Personal Goals (by the next clinic visit) :     Personal A1C goal: 7  (12/15/2009)     Personal blood pressure goal: 130/80  (12/15/2009)     Personal LDL goal: 100  (12/15/2009)    Patient will work on the following items until the next clinic visit to reach self-care goals:     Medications and monitoring: take my medicines every day, check my blood sugar, bring all of my medications to every visit, examine my feet every day  (05/11/2010)     Eating: drink diet soda or water instead of juice or soda, eat more vegetables, use fresh or frozen vegetables, eat foods that are low in salt, eat baked foods instead of fried foods, eat fruit for snacks and desserts, limit or avoid alcohol  (05/11/2010)     Home glucose monitoring frequency: 1 time daily  (04/20/2010)    Diabetes self-management support: Resources for patients  handout  (  05/11/2010)    Hypertension self-management support: Resources for patients handout  (05/11/2010)    Lipid self-management support: Resources for patients handout  (05/11/2010)     Self-management comments: PATIENT STATES SHE PLAYS WITH HER DOG       Resource handout printed.  Process Orders Check Orders Results:     [facility]: Check successful Tests Sent for requisitioning (May 18, 2010 2:51 PM):     05/11/2010: [facility] -- T-Comprehensive Metabolic Panel [[id number]] (signed)     05/11/2010: [facility] -- T-Urinalysis [[id number]] (signed)

## 2010-12-22 NOTE — Assessment & Plan Note (Signed)
Summary: est-ck/fu/med/scfb   Vital Signs:  Patient profile:   50 year old female Height:      62 inches Weight:      231.0 pounds BMI:     42.40 Temp:     97.2 degrees F oral Pulse rate:   90 / minute BP sitting:   115 / 71  (right arm)  Vitals Entered By: Filomena Jungling NT II (January 12, 2010 11:33 AM)  CC: NEED REFILLS, LEFT KNEE AND RIGHT KNEE Is Patient Diabetic? Yes Did you bring your meter with you today? Yes Pain Assessment Patient in pain? yes     Location: KNEE LEFT, AND BACK Intensity: 9 Nutritional Status BMI of > 30 = obese  Does patient need assistance? Functional Status Self care Ambulation Normal Comments USES A CANE   Primary Care Provider:  [name] MD  CC:  NEED REFILLS and LEFT KNEE AND RIGHT KNEE.  History of Present Illness: Patient returns for followup of her diabetes mellitus, hypertension, left knee pain, and other medical problems. Today she is not wearing the left knee brace that was provided by sports medicine, and she reports that the brace was rubbing her leg and irritating it. She now complains of some right knee pain which may be due to redistribution of her weight when walking. She reports that she is compliant with her medications. She is still using hydromorphone on a p.r.n. basis for the knee pain. She has had several low blood sugars over the past few weeks, with values in the 50s, and says that these have most often occurred when she has skipped or delayed a meal.  Preventive Screening-Counseling & Management  Alcohol-Tobacco     Smoking Status: quit     Smoking Cessation Counseling: yes     Year Quit: 1997     Passive Smoke Exposure: no  Caffeine-Diet-Exercise     Does Patient Exercise: yes     Type of exercise: WALKING     Times/week: 5-7W  Current Medications (verified): 1)  Enalapril Maleate 10 Mg Tabs (Enalapril Maleate) .... Take 2 Tablets By Mouth Once A Day 2)  Albuterol 90 Mcg/act Aers (Albuterol) .... Inhale 2  Puffs Four Times A Day  As Needed 3)  Azmacort 100 Mcg/act Aers (Triamcinolone Acetonide(Inhal)) .... Inhale 2 Puffs Two Times A Day 4)  Xanax 1 Mg Tabs (Alprazolam) .... Take 1 Tablet By Mouth Four Times A Day For Anxiety 5)  Synthroid 75 Mcg Tabs (Levothyroxine Sodium) .... Take 1 Tablet By Mouth Once A Day 6)  Glucophage Xr 500 Mg  Tb24 (Metformin Hcl) .... Take 2 Tablets By Mouth Each Am and 1 Tablet Each Pm 7)  Lipitor 10 Mg Tabs (Atorvastatin Calcium) .... Take 1 Tablet By Mouth Once A Day 8)  Nexium 40 Mg Cpdr (Esomeprazole Magnesium) .... Take 1 Capsule By Mouth Once A Day 9)  Aspirin 81 Mg Tbec (Aspirin) .... Take 1 Tablet By Mouth Once A Day 10)  Flonase 50 Mcg/act Susp (Fluticasone Propionate) .... Take 2 Sprays in Each Nostril Once A Day 11)  Metamucil  Powd (Psyllium Powd) .... Take 1 Tablespoonful in 8 Ounces of Water Once A Day To Three Times A Day As Needed For Constipation 12)  Glucotrol 5 Mg Tabs (Glipizide) .... Take 1 Tablet By Mouth Two Times A Day 13)  Colace 100 Mg  Caps (Docusate Sodium) .... Take 1 Capsule By Mouth Two Times A Day. 14)  Carisoprodol 350 Mg Tabs (Carisoprodol) .... Take 1  Tablet By Mouth Four Times A Day As Needed For Muscle Spasm 15)  Nystatin 100000 Unit/gm Powd (Nystatin) .... Apply To Affected Area Two Times A Day For 10 Days 16)  Hydrochlorothiazide 12.5 Mg Caps (Hydrochlorothiazide) .... Take 1 Capsule By Mouth Once A Day 17)  Tramadol Hcl 50 Mg Tabs (Tramadol Hcl) .... Take 1 Tablet By Mouth Every 6 Hours As Needed For Back Pain 18)  Fexofenadine Hcl 60 Mg Tabs (Fexofenadine Hcl) .... Take 1 Tablet By Mouth Two Times A Day For Allergies 19)  Gabapentin 300 Mg Caps (Gabapentin) .... Take 1 Capsule By Mouth Three Times A Day 20)  Erythromycin Ethylsuccinate 400 Mg Tabs (Erythromycin Ethylsuccinate) .... Take 1 Tablet By Mouth Two Times A Day 21)  Hydromorphone Hcl 2 Mg Tabs (Hydromorphone Hcl) .... Take 1 Tablet By Mouth Every 6 Hours As Needed For  Pain 22)  Ferrous Sulfate 325 (65 Fe) Mg Tabs (Ferrous Sulfate) .... Take 1 Tablet By Mouth Three Times A Day  Allergies (verified): 1)  ! Codeine Sulfate (Codeine Sulfate) 2)  ! Penicillin V Potassium (Penicillin V Potassium) 3)  ! Tetracycline Hcl (Tetracycline Hcl) 4)  ! Minocycline Hcl (Minocycline Hcl) 5)  ! Darvocet-N 100  Physical Exam  General:  alert, no distress Lungs:  normal respiratory effort, normal breath sounds, no crackles, and no wheezes.   Heart:  normal rate, regular rhythm, no murmur, no gallop, and no rub.   Msk:  left knee is tender to palpation along the joint line Extremities:  no edema   Impression & Recommendations:  Problem # 1:  DIABETES MELLITUS, TYPE II (ICD-250.00) Patient's A1c is at goal on current regimen. She has had several low blood sugars in the 50s; these may be related to inconsistent meals, but they are concerning. The plan is to decrease Glucotrol to a dose of 2.5 mg b.i.d., and continue metformin at previous dose. I advised her to continue checking her blood sugars, and to let me know if she has any further low blood sugars.  Her updated medication list for this problem includes:    Enalapril Maleate 10 Mg Tabs (Enalapril maleate) .Marland Kitchen... Take 2 tablets by mouth once a day    Glucophage Xr 500 Mg Tb24 (Metformin hcl) .Marland Kitchen... Take 2 tablets by mouth each am and 1 tablet each pm    Aspirin 81 Mg Tbec (Aspirin) .Marland Kitchen... Take 1 tablet by mouth once a day    Glucotrol 5 Mg Tabs (Glipizide) .Marland Kitchen... Take 1/2 tablet by mouth two times a day  Labs Reviewed: Creat: 0.48 (11/24/2009)     Last Eye Exam: No diabetic retinopathy.   Exam by Dr. Ernesto Rutherford (12/10/2009) Reviewed HgBA1c results: 7.0 (11/24/2009)  6.6 (07/07/2009)  Problem # 2:  KNEE PAIN, LEFT (ICD-719.46) Patient reports problems with the knee brace provided at sports medicine and is not wearing it today; she says that the brace was rubbing and irritating her leg.  She also reports continued  left knee pain and now is having some right knee pain. I advised her to follow up with sports medicine to see if her brace can be adjusted so that she can wear it. She may eventually benefit from physical therapy; I will defer decision about that referral to sports medicine.  For now I will continue hydromorphone on a p.r.n. basis for her knee pain (she is intolerant of multiple other pain medications); I advised her to use the hydromorphone sparingly, and discussed the potential adverse effects of narcotic  medications.    Her updated medication list for this problem includes:    Aspirin 81 Mg Tbec (Aspirin) .Marland Kitchen... Take 1 tablet by mouth once a day    Carisoprodol 350 Mg Tabs (Carisoprodol) .Marland Kitchen... Take 1 tablet by mouth four times a day as needed for muscle spasm    Tramadol Hcl 50 Mg Tabs (Tramadol hcl) .Marland Kitchen... Take 1 tablet by mouth every 6 hours as needed for back pain    Hydromorphone Hcl 2 Mg Tabs (Hydromorphone hcl) .Marland Kitchen... Take 1 tablet by mouth every 6 hours as needed for pain  Problem # 3:  HYPERTENSION (ICD-401.9) Patient's blood pressure is well controlled on current regimen.  Plan is to continue current antihypertensive medications, and follow up blood pressure at next visit.   Her updated medication list for this problem includes:    Enalapril Maleate 10 Mg Tabs (Enalapril maleate) .Marland Kitchen... Take 2 tablets by mouth once a day    Hydrochlorothiazide 12.5 Mg Caps (Hydrochlorothiazide) .Marland Kitchen... Take 1 capsule by mouth once a day  BP today: 115/71 Prior BP: 153/87 (01/03/2010)  Labs Reviewed: K+: 4.2 (11/24/2009) Creat: : 0.48 (11/24/2009)   Chol: 146 (11/24/2009)   HDL: 34 (11/24/2009)   LDL: 66 (11/24/2009)   TG: 231 (11/24/2009)  Problem # 4:  HYPERCHOLESTEROLEMIA (ICD-272.0) Patient's LDL is at goal on current dose of Lipitor.  Her updated medication list for this problem includes:    Lipitor 10 Mg Tabs (Atorvastatin calcium) .Marland Kitchen... Take 1 tablet by mouth once a day  Labs Reviewed: SGOT:  18 (11/24/2009)   SGPT: 14 (11/24/2009)   HDL:34 (11/24/2009), 39 (07/07/2009)  LDL:66 (11/24/2009), 86 (07/07/2009)  Chol:146 (11/24/2009), 173 (07/07/2009)  Trig:231 (11/24/2009), 239 (07/07/2009)  Problem # 5:  HYPOTHYROIDISM (ICD-244.9) Patient is doing well on current dose of levothyroxine.  Her updated medication list for this problem includes:    Synthroid 75 Mcg Tabs (Levothyroxine sodium) .Marland Kitchen... Take 1 tablet by mouth once a day  Labs Reviewed: TSH: 4.035 (11/24/2009)    HgBA1c: 7.0 (11/24/2009) Chol: 146 (11/24/2009)   HDL: 34 (11/24/2009)   LDL: 66 (11/24/2009)   TG: 231 (11/24/2009)  Complete Medication List: 1)  Enalapril Maleate 10 Mg Tabs (Enalapril maleate) .... Take 2 tablets by mouth once a day 2)  Albuterol 90 Mcg/act Aers (Albuterol) .... Inhale 2 puffs four times a day  as needed 3)  Azmacort 100 Mcg/act Aers (Triamcinolone acetonide(inhal)) .... Inhale 2 puffs two times a day 4)  Xanax 1 Mg Tabs (Alprazolam) .... Take 1 tablet by mouth four times a day for anxiety 5)  Synthroid 75 Mcg Tabs (Levothyroxine sodium) .... Take 1 tablet by mouth once a day 6)  Glucophage Xr 500 Mg Tb24 (Metformin hcl) .... Take 2 tablets by mouth each am and 1 tablet each pm 7)  Lipitor 10 Mg Tabs (Atorvastatin calcium) .... Take 1 tablet by mouth once a day 8)  Nexium 40 Mg Cpdr (Esomeprazole magnesium) .... Take 1 capsule by mouth once a day 9)  Aspirin 81 Mg Tbec (Aspirin) .... Take 1 tablet by mouth once a day 10)  Flonase 50 Mcg/act Susp (Fluticasone propionate) .... Take 2 sprays in each nostril once a day 11)  Metamucil Powd (Psyllium powd) .... Take 1 tablespoonful in 8 ounces of water once a day to three times a day as needed for constipation 12)  Glucotrol 5 Mg Tabs (Glipizide) .... Take 1/2 tablet by mouth two times a day 13)  Colace 100 Mg Caps (Docusate sodium) .Marland KitchenMarland KitchenMarland Kitchen  Take 1 capsule by mouth two times a day. 14)  Carisoprodol 350 Mg Tabs (Carisoprodol) .... Take 1 tablet by mouth  four times a day as needed for muscle spasm 15)  Nystatin 100000 Unit/gm Powd (Nystatin) .... Apply to affected area two times a day for 10 days 16)  Hydrochlorothiazide 12.5 Mg Caps (Hydrochlorothiazide) .... Take 1 capsule by mouth once a day 17)  Tramadol Hcl 50 Mg Tabs (Tramadol hcl) .... Take 1 tablet by mouth every 6 hours as needed for back pain 18)  Fexofenadine Hcl 60 Mg Tabs (Fexofenadine hcl) .... Take 1 tablet by mouth two times a day for allergies 19)  Gabapentin 300 Mg Caps (Gabapentin) .... Take 1 capsule by mouth three times a day 20)  Erythromycin Ethylsuccinate 400 Mg Tabs (Erythromycin ethylsuccinate) .... Take 1 tablet by mouth two times a day 21)  Hydromorphone Hcl 2 Mg Tabs (Hydromorphone hcl) .... Take 1 tablet by mouth every 6 hours as needed for pain 22)  Ferrous Sulfate 325 (65 Fe) Mg Tabs (Ferrous sulfate) .... Take 1 tablet by mouth three times a day  Patient Instructions: 1)  Please schedule a follow-up appointment in 2 months. 2)  Please follow-up with sports medicine for your knee pain and problems with your knee brace. 3)  Decrease glipizide 5 mg to 1/2 tablet two times a day. 4)  Do not skip meals.  Prescriptions: HYDROMORPHONE HCL 2 MG TABS (HYDROMORPHONE HCL) Take 1 tablet by mouth every 6 hours as needed for pain  #30 x 0   Entered and Authorized by:   [name] MD   Signed by:   [name] MD on 01/12/2010   Method used:   Print then Give to Patient   RxID:   [id number]    Prevention & Chronic Care Immunizations   Influenza vaccine: Fluvax 3+  (11/24/2009)   Influenza vaccine due: 07/21/2009    Tetanus booster: 08/21/2007: Td    Pneumococcal vaccine: Not documented  Other Screening   Pap smear: Interpretation:Negative for intraepithelial lesions or malignancy.     (04/05/2007)    Mammogram: ASSESSMENT: Negative - BI-RADS 1  (05/04/2009)   Mammogram due: 04/2010   Smoking status: quit  (01/12/2010)  Diabetes Mellitus    HgbA1C: 7.0  (11/24/2009)   HgbA1C action/deferral: Ordered  (07/07/2009)   Hemoglobin A1C due: 07/03/2007    Eye exam: No diabetic retinopathy.   Exam by Dr. Ernesto Rutherford  (12/10/2009)   Eye exam due: 12/2010    Foot exam: yes  (04/21/2009)   Foot exam action/deferral: Refused   High risk foot: Yes  (07/07/2009)   Foot care education: Done  (07/07/2009)   Foot exam due: 10/07/2009    Urine microalbumin/creatinine ratio: 9.2  (04/21/2009)   Urine microalbumin/cr due: 04/21/2010    Diabetes flowsheet reviewed?: Yes   Progress toward A1C goal: At goal  Lipids   Total Cholesterol: 146  (11/24/2009)   Lipid panel action/deferral: LDL Direct Ordered   LDL: 66  (11/24/2009)   LDL Direct: 101  (07/07/2009)   HDL: 34  (11/24/2009)   Triglycerides: 231  (11/24/2009)    SGOT (AST): 18  (11/24/2009)   SGPT (ALT): 14  (11/24/2009)   Alkaline phosphatase: 71  (11/24/2009)   Total bilirubin: 0.3  (11/24/2009)    Lipid flowsheet reviewed?: Yes   Progress toward LDL goal: At goal  Hypertension   Last Blood Pressure: 115 / 71  (01/12/2010)   Serum creatinine: 0.48  (11/24/2009)   Serum  potassium 4.2  (11/24/2009)    Hypertension flowsheet reviewed?: Yes   Progress toward BP goal: At goal  Self-Management Support :   Personal Goals (by the next clinic visit) :     Personal A1C goal: 7  (12/15/2009)     Personal blood pressure goal: 130/80  (12/15/2009)     Personal LDL goal: 100  (12/15/2009)    Patient will work on the following items until the next clinic visit to reach self-care goals:     Medications and monitoring: take my medicines every day, check my blood sugar, examine my feet every day  (01/12/2010)     Eating: drink diet soda or water instead of juice or soda, eat more vegetables, use fresh or frozen vegetables, eat foods that are low in salt, eat baked foods instead of fried foods, eat fruit for snacks and desserts, limit or avoid alcohol  (01/12/2010)     Home glucose  monitoring frequency: 1 time daily  (01/12/2010)    Diabetes self-management support: Copy of home glucose meter record, Education handout, Resources for patients handout, Written self-care plan  (01/12/2010)   Diabetes care plan printed   Diabetes education handout printed    Hypertension self-management support: Copy of home glucose meter record, Education handout, Resources for patients handout, Written self-care plan  (01/12/2010)   Hypertension self-care plan printed.   Hypertension education handout printed    Lipid self-management support: Copy of home glucose meter record, Education handout, Resources for patients handout, Written self-care plan  (01/12/2010)   Lipid self-care plan printed.   Lipid education handout printed      Resource handout printed.

## 2010-12-22 NOTE — Assessment & Plan Note (Signed)
Summary: Labs  CBG and HBA1C cancelled per Dr. Scot Dock. Angelina Ok RN  August 26, 2010 3:08 PM

## 2010-12-22 NOTE — Progress Notes (Signed)
Summary: Refill/gh  Phone Note Refill Request   Refills Requested: Medication #1:  HYDROMORPHONE HCL 2 MG TABS Take 1 tablet by mouth every 6 hours as needed for pain  Method Requested: Fax to Local Pharmacy Initial call taken by: Angelina Ok RN,  September 06, 2010 3:25 PM Caller: Patient Call For: Mary Liner MD Summary of Call: Call from pt said that she went to the Urgent Care and was given an Antibiotic.  Pt says that she needs to get her Dilaudid refilled Initial call taken by: Angelina Ok RN,  September 06, 2010 3:22 PM  Follow-up for Phone Call        6/11 visit says uses it for knee pain. I could not find a Advertising account planner. Appears that she get #30 monthly and just got #30 on 08/26/10. Therefore I cannot fill it and will defer to Dr Meredith Pel  Follow-up by: Blanch Media MD,  September 06, 2010 5:04 PM  Additional Follow-up for Phone Call Additional follow up Details #1::        I called patienrt, and she reports increased back pain with recent "flu" symptoms, aggravated by cough.  She was treated with a Z-pack, and then a course of Biaxin started 10/17 in urgent care.  Plan is to refill the hydromorphone, and I advised that she be seen next week in clinic if cough or other symptoms persist.  She has a follow-up appointment with me on 11/16.  I advised her to use the hydromorphone sparingly and only for severe pain.  Rx printed and signed - nurse to complete. Additional Follow-up by: Mary Liner MD,  September 09, 2010 3:41 PM    Prescriptions: HYDROMORPHONE HCL 2 MG TABS (HYDROMORPHONE HCL) Take 1 tablet by mouth every 6 hours as needed for pain  #30 x 0   Entered and Authorized by:   Mary Liner MD   Signed by:   Mary Liner MD on 09/09/2010   Method used:   Print then Give to Patient   RxID:   [id number]

## 2010-12-22 NOTE — Letter (Signed)
Summary: [facility] Referral  [facility] Referral   Imported By: Marily Memos 12/09/2009 15:17:06  _____________________________________________________________________  External Attachment:    Type:   Image     Comment:   External Document

## 2010-12-22 NOTE — Assessment & Plan Note (Signed)
Summary: BODY ACHES,EARACHE,ITCY EYES,RUNNING NOSE,COUGH/JOINES/DS   Vital Signs:  Patient profile:   50 year old female Height:      62 inches (157.48 cm) Weight:      215.05 pounds (97.75 kg) BMI:     39.48 O2 Sat:      96 % on Room air Temp:     97.3 degrees F (36.28 degrees C) oral Pulse rate:   104 / minute BP sitting:   140 / 83  (right arm)  Vitals Entered By: Angelina Ok RN (August 26, 2010 50 year old female Height:  Room air CC: Depression Is Patient Diabetic? Yes Did you bring your meter with you today? No Pain Assessment Patient in pain? yes     Location: back Intensity: 10 Type: aching, stabbing, throbbing Onset of pain  Constant Nutritional Status BMI of > 30 = obese  Have you ever been in a relationship where you felt threatened, hurt or afraid?No   Does patient need assistance? Functional Status Self care Ambulation Impaired:Risk for fall Comments Body aches Ears.  Teeth ache.  Head.  Tylenol every 4 hours for 2 weks.  Chills  and fever. Diarrhea today.  Nausea but no vomiting.   Primary Care Provider:  [name] MD  CC:  Depression.  History of Present Illness: 50 y/o woman with hTN, HLD, DM comes for acute visit  she is having cough no sputum, runny nose, ear ache, head ache, malaise since 2 wekes. has been taking OTc releif medications and decongestiants. her sister who is pretty close to her had similar problems. non smoker. has not been eating too well due to poor appetite since start of symptoms    Depression History:      The patient is having a depressed mood most of the day and has a diminished interest in her usual daily activities.        The patient denies that she feels like life is not worth living, denies that she wishes that she were dead, and denies that she has thought about ending her life.         Preventive Screening-Counseling & Management  Alcohol-Tobacco     Smoking Status: quit     Smoking Cessation Counseling: yes  Year Quit: 1997     Passive Smoke Exposure: no  Current Medications (verified): 1)  Enalapril Maleate 10 Mg Tabs (Enalapril Maleate) .... Take 1 Tablet By Mouth Once A Day 2)  Ventolin Hfa 108 (90 Base) Mcg/act Aers (Albuterol Sulfate) .... Inhale 2 Puffs Four Times A Day  As Needed 3)  Azmacort 100 Mcg/act Aers (Triamcinolone Acetonide(Inhal)) .... Inhale 2 Puffs Two Times A Day 4)  Xanax 1 Mg Tabs (Alprazolam) .... Take 1 Tablet By Mouth Four Times A Day For Anxiety 5)  Synthroid 75 Mcg Tabs (Levothyroxine Sodium) .... Take 1 Tablet By Mouth Once A Day 6)  Glucophage Xr 500 Mg  Tb24 (Metformin Hcl) .... Take 2 Tablets By Mouth Each Am and 1 Tablet Each Pm 7)  Nexium 40 Mg Cpdr (Esomeprazole Magnesium) .... Take 1 Capsule By Mouth Once A Day 8)  Aspirin 81 Mg Tbec (Aspirin) .... Take 1 Tablet By Mouth Once A Day 9)  Flonase 50 Mcg/act Susp (Fluticasone Propionate) .... Take 2 Sprays in Each Nostril Once A Day 10)  Metamucil  Powd (Psyllium Powd) .... Take 1 Tablespoonful in 8 Ounces of Water Once A Day To Three Times A Day As Needed For Constipation 11)  Colace 100  Mg  Caps (Docusate Sodium) .... Take 1 Capsule By Mouth Two Times A Day. 12)  Carisoprodol 350 Mg Tabs (Carisoprodol) .... Take 1 Tablet By Mouth Four Times A Day As Needed For Muscle Spasm 13)  Nystatin 100000 Unit/gm Powd (Nystatin) .... Apply To Affected Area Two Times A Day For 10 Days 14)  Hydrochlorothiazide 12.5 Mg Caps (Hydrochlorothiazide) .... Take 1 Capsule By Mouth Once A Day 15)  Tramadol Hcl 50 Mg Tabs (Tramadol Hcl) .... Take 1/2 Tablet By Mouth Every 6 Hours As Needed For Back Pain 16)  Fexofenadine Hcl 60 Mg Tabs (Fexofenadine Hcl) .... Take 1 Tablet By Mouth Two Times A Day For Allergies 17)  Gabapentin 300 Mg Caps (Gabapentin) .... Take 1 Capsule By Mouth Four Times A Day 18)  Hydromorphone Hcl 2 Mg Tabs (Hydromorphone Hcl) .... Take 1 Tablet By Mouth Every 6 Hours As Needed For Pain 19)  Ferrous Sulfate 325 (65  Fe) Mg Tabs (Ferrous Sulfate) .... Take 1 Tablet By Mouth Three Times A Day 20)  Accu-Chek Compact Test Drum  Strp (Glucose Blood) .... Use To Check Blood Sugar One Time A Day  Allergies (verified): 1)  ! Codeine Sulfate (Codeine Sulfate) 2)  ! Penicillin V Potassium (Penicillin V Potassium) 3)  ! Tetracycline Hcl (Tetracycline Hcl) 4)  ! Minocycline Hcl (Minocycline Hcl) 5)  ! Darvocet-N 100  Review of Systems       The patient complains of hoarseness and headaches.  The patient denies anorexia, fever, weight loss, weight gain, vision loss, decreased hearing, chest pain, syncope, dyspnea on exertion, peripheral edema, prolonged cough, hemoptysis, abdominal pain, melena, hematochezia, severe indigestion/heartburn, hematuria, incontinence, genital sores, muscle weakness, suspicious skin lesions, transient blindness, difficulty walking, depression, unusual weight change, abnormal bleeding, enlarged lymph nodes, angioedema, breast masses, and testicular masses.    Physical Exam  General:  alert and well-developed.   Head:  normocephalic and atraumatic.   Eyes:  vision grossly intact, pupils equal, pupils round, and pupils reactive to light.   Ears:  R ear normal and L ear normal.   Neck:  supple and no thyroid nodules or tenderness.   Lungs:  normal respiratory effort, no intercostal retractions, no accessory muscle use, normal breath sounds, no dullness, and no wheezes.   Heart:  normal rate, regular rhythm, no murmur, and no gallop.   Abdomen:  soft, non-tender, and normal bowel sounds.   Msk:  normal ROM.   Pulses:  dorsalis pedis pulses normal bilaterally  Extremities:  no edema Neurologic:  OrientedX3, cranial nerver 2-12 intact,strength good in all extremities, sensations normal to light touch, reflexes 2+ b/l, gait normal    Impression & Recommendations:  Problem # 1:  URI (ICD-465.9) given PE and symptoms, viral pharyngitis seems likely, but pateint is having these for 2 weeks  now.   plan -continue supportive care -add z-pack -RTC in 2 week if no improvemnt- consider CXR and different Abx at that time -tylenol with codiene for symptomatic releif  Her updated medication list for this problem includes:    Aspirin 81 Mg Tbec (Aspirin) .Marland Kitchen... Take 1 tablet by mouth once a day    Fexofenadine Hcl 60 Mg Tabs (Fexofenadine hcl) .Marland Kitchen... Take 1 tablet by mouth two times a day for allergies  Complete Medication List: 1)  Enalapril Maleate 10 Mg Tabs (Enalapril maleate) .... Take 1 tablet by mouth once a day 2)  Ventolin Hfa 108 (90 Base) Mcg/act Aers (Albuterol sulfate) .... Inhale 2 puffs four times  a day  as needed 3)  Azmacort 100 Mcg/act Aers (Triamcinolone acetonide(inhal)) .... Inhale 2 puffs two times a day 4)  Xanax 1 Mg Tabs (Alprazolam) .... Take 1 tablet by mouth four times a day for anxiety 5)  Synthroid 75 Mcg Tabs (Levothyroxine sodium) .... Take 1 tablet by mouth once a day 6)  Glucophage Xr 500 Mg Tb24 (Metformin hcl) .... Take 2 tablets by mouth each am and 1 tablet each pm 7)  Nexium 40 Mg Cpdr (Esomeprazole magnesium) .... Take 1 capsule by mouth once a day 8)  Aspirin 81 Mg Tbec (Aspirin) .... Take 1 tablet by mouth once a day 9)  Flonase 50 Mcg/act Susp (Fluticasone propionate) .... Take 2 sprays in each nostril once a day 10)  Metamucil Powd (Psyllium powd) .... Take 1 tablespoonful in 8 ounces of water once a day to three times a day as needed for constipation 11)  Colace 100 Mg Caps (Docusate sodium) .... Take 1 capsule by mouth two times a day. 12)  Carisoprodol 350 Mg Tabs (Carisoprodol) .... Take 1 tablet by mouth four times a day as needed for muscle spasm 13)  Nystatin 100000 Unit/gm Powd (Nystatin) .... Apply to affected area two times a day for 10 days 14)  Hydrochlorothiazide 12.5 Mg Caps (Hydrochlorothiazide) .... Take 1 capsule by mouth once a day 15)  Tramadol Hcl 50 Mg Tabs (Tramadol hcl) .... Take 1/2 tablet by mouth every 6 hours as  needed for back pain 16)  Fexofenadine Hcl 60 Mg Tabs (Fexofenadine hcl) .... Take 1 tablet by mouth two times a day for allergies 17)  Gabapentin 300 Mg Caps (Gabapentin) .... Take 1 capsule by mouth four times a day 18)  Hydromorphone Hcl 2 Mg Tabs (Hydromorphone hcl) .... Take 1 tablet by mouth every 6 hours as needed for pain 19)  Ferrous Sulfate 325 (65 Fe) Mg Tabs (Ferrous sulfate) .... Take 1 tablet by mouth three times a day 20)  Accu-chek Compact Test Drum Strp (Glucose blood) .... Use to check blood sugar one time a day 21)  Azithromycin 250 Mg Tabs (Azithromycin) .... 2 tabs today, 1 tab a day for 4 days after that. 22)  Acetaminophen-codeine #3 300-30 Mg Tabs (Acetaminophen-codeine) .Marland Kitchen.. 1 tablet every 6 hrs as needed for body ache and back pain  Other Orders: T- Capillary Blood Glucose (82948) T-Hgb A1C (in-house) (11914NW)  Patient Instructions: 1)  Please schedule a follow-up appointment in 1 month. 2)  Get plenty of rest, drink lots of clear liquids, and use Tylenol or Ibuprofen for fever and comfort. Return in 7-10 days if you're not better:sooner if you're feeling worse. Prescriptions: HYDROMORPHONE HCL 2 MG TABS (HYDROMORPHONE HCL) Take 1 tablet by mouth every 6 hours as needed for pain  #30 x 0   Entered and Authorized by:   Bethel Born MD   Signed by:   Bethel Born MD on 08/26/2010   Method used:   Print then Give to Patient   RxID:   [id number] ACETAMINOPHEN-CODEINE #3 300-30 MG TABS (ACETAMINOPHEN-CODEINE) 1 tablet every 6 hrs as needed for body ache and back pain  #30 x 0   Entered and Authorized by:   Bethel Born MD   Signed by:   Bethel Born MD on 08/26/2010   Method used:   Print then Give to Patient   RxID:   [id number] AZITHROMYCIN 250 MG TABS (AZITHROMYCIN) 2 tabs today, 1 tab a day for 4 days after that.  #6 x  0   Entered and Authorized by:   Bethel Born MD   Signed by:   Bethel Born MD on 08/26/2010   Method used:   Electronically  to        [facility]  [address] [phone][facility] (retail)       [address]       [place], [place]  [address]       Ph: [phone]       Fax: [phone]   RxID:   [id number]   Prevention & Chronic Care Immunizations   Influenza vaccine: Fluvax 3+  (11/24/2009)   Influenza vaccine due: 07/21/2009    Tetanus booster: 08/21/2007: Td    Pneumococcal vaccine: Not documented  Other Screening   Pap smear: Interpretation:Negative for intraepithelial lesions or malignancy.     (04/05/2007)    Mammogram: ASSESSMENT: Negative - BI-RADS 1  (05/04/2009)   Mammogram due: 04/2010   Smoking status: quit  (08/26/2010)  Diabetes Mellitus   HgbA1C: 6.6  (04/20/2010)   HgbA1C action/deferral: Ordered  (03/02/2010)   Hemoglobin A1C due: 06/01/2010    Eye exam: No diabetic retinopathy.   Exam by Dr. Ernesto Rutherford  (12/10/2009)   Eye exam due: 12/2010    Foot exam: yes  (04/20/2010)   Foot exam action/deferral: Do today   High risk foot: Yes  (07/07/2009)   Foot care education: Done  (04/20/2010)   Foot exam due: 10/07/2009    Urine microalbumin/creatinine ratio: 7.0  (04/29/2010)   Urine microalbumin action/deferral: Ordered   Urine microalbumin/cr due: 04/21/2010  Lipids   Total Cholesterol: 239  (04/20/2010)   Lipid panel action/deferral: LDL Direct Ordered   LDL: 244  (04/20/2010)   LDL Direct: 101  (07/07/2009)   HDL: 39  (04/20/2010)   Triglycerides: 378  (04/20/2010)   Lipid panel due: 05/24/2010    SGOT (AST): 19  (05/11/2010)   SGPT (ALT): 22  (05/11/2010)   Alkaline phosphatase: 71  (05/11/2010)   Total bilirubin: 0.3  (05/11/2010)   Liver panel due: 05/24/2010  Hypertension   Last Blood Pressure: 140 / 83  (08/26/2010)   Serum creatinine: 0.57  (05/11/2010)   Serum potassium 3.9  (05/11/2010)   Basic metabolic panel due: 09/01/2010  Self-Management Support :   Personal Goals (by the next clinic visit) :     Personal A1C goal: 7   (12/15/2009)     Personal blood pressure goal: 130/80  (12/15/2009)     Personal LDL goal: 100  (12/15/2009)    Patient will work on the following items until the next clinic visit to reach self-care goals:     Medications and monitoring: take my medicines every day, check my blood sugar, bring all of my medications to every visit, examine my feet every day  (08/26/2010)     Eating: drink diet soda or water instead of juice or soda, eat more vegetables, use fresh or frozen vegetables, eat foods that are low in salt, eat baked foods instead of fried foods, eat fruit for snacks and desserts, limit or avoid alcohol  (08/26/2010)     Activity: take a 30 minute walk every day  (08/26/2010)     Home glucose monitoring frequency: 1 time daily  (04/20/2010)    Diabetes self-management support: Written self-care plan, Education handout, Pre-printed educational material, Resources for patients handout  (08/26/2010)   Diabetes care plan printed   Diabetes education handout printed    Hypertension self-management  support: Written self-care plan, Education handout, Pre-printed educational material, Resources for patients handout  (08/26/2010)   Hypertension self-care plan printed.   Hypertension education handout printed    Lipid self-management support: Written self-care plan, Education handout, Pre-printed educational material, Resources for patients handout  (08/26/2010)   Lipid self-care plan printed.   Lipid education handout printed      Resource handout printed.    Vital Signs:  Patient profile:   50 year old female Height:      62 inches (157.48 cm) Weight:      215.05 pounds (97.75 kg) BMI:     39.48 O2 Sat:      96 % Temp:     97.3 degrees F (36.28 degrees C) oral Pulse rate:   104 / minute BP sitting:   140 / 83  (right arm)  Vitals Entered By: Angelina Ok RN (August 26, 2010 50 year old female Height:  Room air

## 2010-12-22 NOTE — Miscellaneous (Signed)
[facility]   Imported By: Shon Hough 10/18/2010 11:31:58  _____________________________________________________________________  External Attachment:    Type:   Image     Comment:   External Document

## 2010-12-22 NOTE — Medication Information (Signed)
Summary: HYDROMORPHONE/HCL  HYDROMORPHONE/HCL   Imported By: Margie Billet 11/01/2010 11:26:59  _____________________________________________________________________  External Attachment:    Type:   Image     Comment:   External Document

## 2010-12-22 NOTE — Progress Notes (Signed)
Summary: med refill/gp  Phone Note Refill Request Message from:  Fax from Pharmacy on November 04, 2010 9:58 AM  Refills Requested: Medication #1:  FERROUS SULFATE 325 (65 FE) MG TABS Take 1 tablet by mouth three times a day   Last Refilled: 09/06/2010 Last appt. 10/05/10.   Method Requested: Electronic Initial call taken by: Chinita Pester RN,  November 04, 2010 9:59 AM  Follow-up for Phone Call        Mary Gray has a history of heme (+) stools and iron deficiency anemia.  She has had an extensive GI work-up looking for the source with no obvious culprits being found.  Her last iron panel this September was improved and no longer qualified for a diagnosis of iron deficiency anemia.  I do not know Mary Gray and have no idea as to the degree of continuing blood loss, if any.  I do not see a history of gastric bypass surgery to suggest that she requires chronic supplementation.  Therefore, I am not sure if continuing the ferrous sulfate is clinically indicated or not.  I will defer the decision to refill the ferrous sulfate to her attending physician, Dr. Meredith Pel.  This can wait for his return next week. Follow-up by: Doneen Poisson MD,  November 04, 2010 11:08 AM  Additional Follow-up for Phone Call Additional follow up Details #1::        Give recent workup for heme positive stools, will refill for 1 month. Additional Follow-up by: [name] MD,  November 07, 2010 3:26 PM    Prescriptions: FERROUS SULFATE 325 (65 FE) MG TABS (FERROUS SULFATE) Take 1 tablet by mouth three times a day  #90 Tablet x 0   Entered and Authorized by:   [name] MD   Signed by:   [name] MD on 11/07/2010   Method used:   [facility]  [address] [phone]* (retail)       [address]       [place], [place]  [address]       Ph: [phone]       Fax: [phone]   RxID:   [id number]

## 2010-12-22 NOTE — Progress Notes (Signed)
Summary: appt/ hla  Phone Note Call from Patient   Summary of Call: pt calls, c/o L leg cont to have pain from fall, fell 12/25, appt given for 2/14, pt wants pain meds, muscle relaxer, called pharmacy meds as follows: CARISOPRODOL #90 PICKED UP 12/04/09, TRAMADOL #30 PU1/26 , GABAPENTIN PU 1/18, HYDROMORPHONE #60 PU 1/23 Initial call taken by: Marin Roberts RN,  January 05, 2010 12:18 PM

## 2010-12-22 NOTE — Medication Information (Signed)
Summary: HYDROMORPHONE  HYDROMORPHONE   Imported By: Margie Billet 11/25/2010 14:31:41  _____________________________________________________________________  External Attachment:    Type:   Image     Comment:   External Document

## 2010-12-24 ENCOUNTER — Other Ambulatory Visit: Payer: Self-pay | Admitting: Internal Medicine

## 2010-12-26 NOTE — Telephone Encounter (Addendum)
Soma called into pharmacy Would not go e-scribe

## 2010-12-28 NOTE — Consult Note (Signed)
[facility]: [facility]   Imported By: Margie Billet 12/06/2010 11:09:21  _____________________________________________________________________  External Attachment:    Type:   Image     Comment:   External Document

## 2011-01-02 ENCOUNTER — Telehealth: Payer: Self-pay | Admitting: *Deleted

## 2011-01-02 NOTE — Telephone Encounter (Signed)
Call made to initiate PA for carisoprodol 350mg  tabs.  I was unable to complete the prior authorization request form and form was placed in MD box.  I did inform [employer] that it appears patient has been getting this med for over 27yrs .  PA was later approved until 01/02/12,  form removed from MD's box and contacted [facility] to inform them of the approval.

## 2011-01-06 ENCOUNTER — Other Ambulatory Visit: Payer: Self-pay | Admitting: Internal Medicine

## 2011-01-11 NOTE — Telephone Encounter (Signed)
Called to pharm, this cannot be e-prescribed

## 2011-01-17 ENCOUNTER — Telehealth: Payer: Self-pay | Admitting: *Deleted

## 2011-01-17 NOTE — Telephone Encounter (Signed)
Pt called with c/o pain to rt side of abdomen.  Onset yesterday.   Pain more intense yesterday, better today but wants to be seen. She took 2 ASA, muscle relaxant and dilaudid.   This is the first time she had this pain.  Denies nausea/vomiting.  Last BM yesterday. Eating okay.  Today pain is on and off.  I talked with Dr Meredith Pel  And he advised ED if pain is significant.  Pt told and will go to ED if pain gets worse.   I did make appointment for tomorrow in case she does not go to ED.

## 2011-01-18 ENCOUNTER — Ambulatory Visit (INDEPENDENT_AMBULATORY_CARE_PROVIDER_SITE_OTHER): Payer: Medicare Other | Admitting: Ophthalmology

## 2011-01-18 ENCOUNTER — Encounter: Payer: Self-pay | Admitting: Ophthalmology

## 2011-01-18 DIAGNOSIS — K219 Gastro-esophageal reflux disease without esophagitis: Secondary | ICD-10-CM

## 2011-01-18 DIAGNOSIS — R1011 Right upper quadrant pain: Secondary | ICD-10-CM

## 2011-01-18 DIAGNOSIS — I10 Essential (primary) hypertension: Secondary | ICD-10-CM

## 2011-01-18 DIAGNOSIS — M5137 Other intervertebral disc degeneration, lumbosacral region: Secondary | ICD-10-CM

## 2011-01-18 LAB — COMPREHENSIVE METABOLIC PANEL
AST: 30 U/L (ref 0–37)
Albumin: 4.5 g/dL (ref 3.5–5.2)
BUN: 8 mg/dL (ref 6–23)
CO2: 30 mEq/L (ref 19–32)
Calcium: 9.2 mg/dL (ref 8.4–10.5)
Chloride: 93 mEq/L — ABNORMAL LOW (ref 96–112)
Potassium: 4 mEq/L (ref 3.5–5.3)

## 2011-01-18 LAB — CBC
Hemoglobin: 11.9 g/dL — ABNORMAL LOW (ref 12.0–15.0)
MCHC: 33.7 g/dL (ref 30.0–36.0)
RDW: 13.3 % (ref 11.5–15.5)
WBC: 6 10*3/uL (ref 4.0–10.5)

## 2011-01-18 MED ORDER — LEVOTHYROXINE SODIUM 75 MCG PO TABS: 75.0000 ug | ORAL_TABLET | Freq: Every day | ORAL | Status: DC

## 2011-01-18 MED ORDER — HYDROMORPHONE HCL 2 MG PO TABS: 2.0000 mg | ORAL_TABLET | ORAL | Status: DC | PRN

## 2011-01-18 MED ORDER — ROSUVASTATIN CALCIUM 5 MG PO TABS: 5.0000 mg | ORAL_TABLET | Freq: Every day | ORAL | Status: DC

## 2011-01-18 MED ORDER — FERROUS SULFATE 325 (65 FE) MG PO TABS: 325.0000 mg | ORAL_TABLET | Freq: Three times a day (TID) | ORAL | Status: DC

## 2011-01-18 NOTE — Assessment & Plan Note (Signed)
The patient continues to complain lumbar back pain and exam demonstrates significant paraspinal muscular tenderness , though there is no tenderness over the spinous processes. I did refill the patient's daughter today

## 2011-01-18 NOTE — Assessment & Plan Note (Signed)
The patients GERD is under good control at this time and the patient is taking their medications regularly.  The patient was encouraged to continue to avoid triggers, avoid laying flat within two hours of a meal.  Will continue to monitor for continued control at future visits.   

## 2011-01-18 NOTE — Assessment & Plan Note (Signed)
The patients blood pressure was within reasonable control today (BP: 122/75 mmHg ) and I will not make any adjustments to the patients anti-hypertensive regimen. I will continue to monitor and titrate the patients medications as needed at future visits.

## 2011-01-18 NOTE — Assessment & Plan Note (Addendum)
The patient describes her pain as right lower quadrant tenderness, but on physical exam this is clearly right upper quadrant abdominal pain.  The differential diagnosis includes constipation vs. MSK pain versus cholelithiasis vs. Hepatic pathology. The patient did not have an acute abdomen at this point in time. The patient states that her pain is improving over 2 days ago. I will recommend a right upper quadrant ultrasound be performed to further evaluate this. I also recommended the patient continue on her medications for constipation. I informed patient that she should contact the clinic if she redevelops constipation so that her medications can be titrated.    I will also check abdominal x-ray to evaluate stool burden and CMET as well as CBC.Marland Kitchen

## 2011-01-18 NOTE — Patient Instructions (Signed)
I like you to followup with [place] in one week to ensure improvement in your symptoms , I will have you do an x-ray of your belly as well as an ultrasound of your right upper quadrant to look for the problem.

## 2011-01-18 NOTE — Progress Notes (Signed)
Subjective:    Patient ID: Mary Gray, female    DOB: Dec 13, 1960, 50 y.o.   MRN: [id number]  HPI  This is a 50 year old female with a past medical history significant for GERD, type 2 diabetes, and hypertension, who presents for an acute visit for abdominal pain. Pt developed pain 2 days ago.   The pain was initially described as a pressure in the right lower quadrant it was 10 out of 10 in severity 2 days ago and is gradually improved to 7/10 in severity. The patient states that the pain is worse after meals and has been avoiding them. At the time of the onset of her pain, the patient had not had a bowel movement for approximately 7 days but has not been entirely abnormal for her. The patient is currently on multiple stool softeners including most recently GlycoLax added by her GI doctor. The patient's symptoms have been associated with mild nausea but no vomiting , she denies fevers or chills, she is postmenopausal, and denies any bleeding or vaginal discharge. The patient has never had abdominal surgery in the past.    Review of Systems  Constitutional: Negative for fever and chills.  Respiratory: Negative for cough and shortness of breath.   Cardiovascular: Negative for chest pain and palpitations.  Gastrointestinal: Positive for constipation. Negative for vomiting and diarrhea.       Objective:   Physical Exam  Constitutional: She appears well-developed and well-nourished.  HENT:  Head: Normocephalic and atraumatic.  Eyes: Pupils are equal, round, and reactive to light.  Cardiovascular: Normal rate, regular rhythm and intact distal pulses.  Exam reveals no gallop and no friction rub.   No murmur heard. Pulmonary/Chest: Effort normal and breath sounds normal. She has no wheezes. She has no rales.  Abdominal: Soft. Bowel sounds are normal. She exhibits no distension. There is tenderness.        The patient has marked right upper quadrant tenderness a ? positive Murphy sign. There is  no right lower culture pain.  The patient does have normal bowel sounds , and no distention.  Musculoskeletal: Normal range of motion.        There is significant paraspinal muscular tenderness at the lumbar spine,  There is no tenderness over the spinous processes.  Neurological: She is alert. No cranial nerve deficit.  Skin: No rash noted.       Current outpatient prescriptions:albuterol (VENTOLIN HFA) 108 (90 BASE) MCG/ACT inhaler, Inhale 2 puffs into the lungs every 6 (six) hours as needed.  , Disp: , Rfl: ;  ALPRAZolam (XANAX) 1 MG tablet, Take 1 mg by mouth 4 (four) times daily as needed.  , Disp: , Rfl: ;  aspirin 81 MG chewable tablet, Chew 81 mg by mouth daily.  , Disp: , Rfl: ;  docusate sodium (COLACE) 100 MG capsule, Take 100 mg by mouth 2 (two) times daily.  , Disp: , Rfl:  enalapril (VASOTEC) 10 MG tablet, Take 10 mg by mouth daily.  , Disp: , Rfl: ;  erythromycin ethylsuccinate (EES) 400 MG tablet, Take 400 mg by mouth 2 (two) times daily.  , Disp: , Rfl: ;  esomeprazole (NEXIUM) 40 MG capsule, Take 40 mg by mouth daily before breakfast.  , Disp: , Rfl: ;  Fesoterodine Fumarate (TOVIAZ) 8 MG TB24, Take by mouth.  , Disp: , Rfl:  fexofenadine (ALLEGRA) 60 MG tablet, Take 60 mg by mouth 2 (two) times daily as needed.  , Disp: , Rfl: ;  fluticasone (FLONASE) 50 MCG/ACT nasal spray, 2 sprays by Nasal route daily.  , Disp: , Rfl: ;  gabapentin (NEURONTIN) 300 MG capsule, Take 300 mg by mouth 3 (three) times daily.  , Disp: , Rfl: ;  glucose blood test strip, 1 each by Other route as needed. Use as instructed , Disp: , Rfl:  hydrochlorothiazide (,MICROZIDE/HYDRODIURIL,) 12.5 MG capsule, Take 12.5 mg by mouth daily.  , Disp: , Rfl: ;  HYDROmorphone (DILAUDID) 2 MG tablet, Take 1 tablet (2 mg total) by mouth every 4 (four) hours as needed., Disp: 30 tablet, Rfl: 0;  levothyroxine (SYNTHROID, LEVOTHROID) 75 MCG tablet, Take 1 tablet (75 mcg total) by mouth daily., Disp: 30 tablet, Rfl: 11 psyllium  (REGULOID) 0.52 G capsule, Take 0.52 g by mouth daily. 1 tablespoon in 8 oz of h2o daily , Disp: , Rfl: ;  rOPINIRole (REQUIP) 0.5 MG tablet, Take 0.5 mg by mouth 3 (three) times daily.  , Disp: , Rfl: ;  rosuvastatin (CRESTOR) 5 MG tablet, Take 1 tablet (5 mg total) by mouth daily., Disp: 30 tablet, Rfl: 11;  traMADol (ULTRAM) 50 MG tablet, Take 50 mg by mouth every 6 (six) hours as needed.  , Disp: , Rfl:  triamcinolone (AZMACORT) 75 MCG/ACT inhaler, Inhale 2 puffs into the lungs 2 (two) times daily.  , Disp: , Rfl: ;  DISCONTD: HYDROmorphone (DILAUDID) 2 MG tablet, Take 2 mg by mouth every 4 (four) hours as needed.  , Disp: , Rfl: ;  DISCONTD: levothyroxine (SYNTHROID, LEVOTHROID) 75 MCG tablet, Take 75 mcg by mouth daily.  , Disp: , Rfl: ;  DISCONTD: rosuvastatin (CRESTOR) 5 MG tablet, Take 5 mg by mouth daily.  , Disp: , Rfl:  carisoprodol (SOMA) 350 MG tablet, TAKE 1 TABLET BY MOUTH FOUR TIMES A DAY AS NEEDED FOR MUSCLE SPASM, Disp: 90 tablet, Rfl: 2;  ferrous sulfate 325 (65 FE) MG tablet, Take 1 tablet (325 mg total) by mouth 3 (three) times daily with meals., Disp: 90 tablet, Rfl: 0;  levothyroxine (SYNTHROID) 75 MCG tablet, Take 1 tablet (75 mcg total) by mouth daily., Disp: 30 tablet, Rfl: 11 metFORMIN (GLUCOPHAGE-XR) 500 MG 24 hr tablet, TAKE 2 TABLETS EVERY MORNING AND 1 TABLET EVERY EVENING, Disp: 90 tablet, Rfl: 8;  DISCONTD: ferrous sulfate 325 (65 FE) MG tablet, TAKE 1 TABLET BY MOUTH THREE TIMES A DAY, Disp: 90 tablet, Rfl: 0   Assessment & Plan:

## 2011-01-23 ENCOUNTER — Telehealth: Payer: Self-pay | Admitting: Ophthalmology

## 2011-01-23 ENCOUNTER — Ambulatory Visit (HOSPITAL_COMMUNITY)
Admission: RE | Admit: 2011-01-23 | Discharge: 2011-01-23 | Disposition: A | Discharge status: Home / Self Care | Payer: Medicare Other | Source: Ambulatory Visit | Attending: Internal Medicine | Admitting: Internal Medicine

## 2011-01-23 DIAGNOSIS — R1011 Right upper quadrant pain: Secondary | ICD-10-CM

## 2011-01-23 DIAGNOSIS — K5900 Constipation, unspecified: Secondary | ICD-10-CM | POA: Insufficient documentation

## 2011-01-23 DIAGNOSIS — R10811 Right upper quadrant abdominal tenderness: Secondary | ICD-10-CM | POA: Insufficient documentation

## 2011-01-23 NOTE — Telephone Encounter (Signed)
I spoke with the patient and informed her of the results of her x ray and her right upper quadrant ultrasound.  At this point, it appears that the patients symptoms were likely secondary to her constipation.  Pt now states that she has had complete resolution of her symptoms and would like to cancel her appointment upcomming appointment.  I recommended that the patient continue taking her medications for constipation, and the patient states that she is having regular bowel movements.

## 2011-01-24 ENCOUNTER — Telehealth: Payer: Self-pay | Admitting: *Deleted

## 2011-01-24 NOTE — Telephone Encounter (Signed)
Pt called and states she was told to call in today for her lab results. I noted her hgb was slightly lower that previous and she had some GI problems.  What shall I tell her?

## 2011-01-24 NOTE — Telephone Encounter (Signed)
Pt called back again and would like Dr Cathey Endow to call her at [phone]

## 2011-01-25 ENCOUNTER — Ambulatory Visit: Payer: Medicare Other | Admitting: Internal Medicine

## 2011-01-25 NOTE — Telephone Encounter (Signed)
I contacted the patient to infarct of the results of her lab work. The patient is mildly anemic but it appears that she is at her baseline. The patient also has decreased sodium and chloride but this also appears to be her baseline. I do not feel these need further workup at this point and I do not feel that they're related to the patient's abdominal pain that she previously had.

## 2011-02-01 LAB — GLUCOSE, CAPILLARY: Glucose-Capillary: 127 mg/dL — ABNORMAL HIGH (ref 70–99)

## 2011-02-05 LAB — GLUCOSE, CAPILLARY: Glucose-Capillary: 128 mg/dL — ABNORMAL HIGH (ref 70–99)

## 2011-02-06 LAB — GLUCOSE, CAPILLARY: Glucose-Capillary: 148 mg/dL — ABNORMAL HIGH (ref 70–99)

## 2011-02-11 ENCOUNTER — Telehealth: Payer: Self-pay | Admitting: Internal Medicine

## 2011-02-11 ENCOUNTER — Emergency Department (HOSPITAL_COMMUNITY)
Admission: EM | Admit: 2011-02-11 | Discharge: 2011-02-12 | Disposition: A | Discharge status: Home / Self Care | Payer: Medicare Other | Attending: Emergency Medicine | Admitting: Emergency Medicine

## 2011-02-11 ENCOUNTER — Emergency Department (HOSPITAL_COMMUNITY): Payer: Medicare Other

## 2011-02-11 ENCOUNTER — Encounter (HOSPITAL_COMMUNITY): Payer: Self-pay | Admitting: Radiology

## 2011-02-11 DIAGNOSIS — E119 Type 2 diabetes mellitus without complications: Secondary | ICD-10-CM | POA: Insufficient documentation

## 2011-02-11 DIAGNOSIS — Z79899 Other long term (current) drug therapy: Secondary | ICD-10-CM | POA: Insufficient documentation

## 2011-02-11 DIAGNOSIS — E871 Hypo-osmolality and hyponatremia: Secondary | ICD-10-CM | POA: Insufficient documentation

## 2011-02-11 DIAGNOSIS — J4489 Other specified chronic obstructive pulmonary disease: Secondary | ICD-10-CM | POA: Insufficient documentation

## 2011-02-11 DIAGNOSIS — R112 Nausea with vomiting, unspecified: Secondary | ICD-10-CM | POA: Insufficient documentation

## 2011-02-11 DIAGNOSIS — E7800 Pure hypercholesterolemia, unspecified: Secondary | ICD-10-CM | POA: Insufficient documentation

## 2011-02-11 DIAGNOSIS — R141 Gas pain: Secondary | ICD-10-CM | POA: Insufficient documentation

## 2011-02-11 DIAGNOSIS — M549 Dorsalgia, unspecified: Secondary | ICD-10-CM | POA: Insufficient documentation

## 2011-02-11 DIAGNOSIS — G8929 Other chronic pain: Secondary | ICD-10-CM | POA: Insufficient documentation

## 2011-02-11 DIAGNOSIS — R142 Eructation: Secondary | ICD-10-CM | POA: Insufficient documentation

## 2011-02-11 DIAGNOSIS — K219 Gastro-esophageal reflux disease without esophagitis: Secondary | ICD-10-CM | POA: Insufficient documentation

## 2011-02-11 DIAGNOSIS — I10 Essential (primary) hypertension: Secondary | ICD-10-CM | POA: Insufficient documentation

## 2011-02-11 DIAGNOSIS — J449 Chronic obstructive pulmonary disease, unspecified: Secondary | ICD-10-CM | POA: Insufficient documentation

## 2011-02-11 DIAGNOSIS — R079 Chest pain, unspecified: Secondary | ICD-10-CM | POA: Insufficient documentation

## 2011-02-11 DIAGNOSIS — Z7982 Long term (current) use of aspirin: Secondary | ICD-10-CM | POA: Insufficient documentation

## 2011-02-11 DIAGNOSIS — R197 Diarrhea, unspecified: Secondary | ICD-10-CM | POA: Insufficient documentation

## 2011-02-11 DIAGNOSIS — E039 Hypothyroidism, unspecified: Secondary | ICD-10-CM | POA: Insufficient documentation

## 2011-02-11 DIAGNOSIS — R109 Unspecified abdominal pain: Secondary | ICD-10-CM | POA: Insufficient documentation

## 2011-02-11 HISTORY — DX: Essential (primary) hypertension: I10

## 2011-02-11 LAB — CBC
HCT: 29.6 % — ABNORMAL LOW (ref 36.0–46.0)
Hemoglobin: 10.4 g/dL — ABNORMAL LOW (ref 12.0–15.0)
MCHC: 35.1 g/dL (ref 30.0–36.0)
RBC: 3.49 MIL/uL — ABNORMAL LOW (ref 3.87–5.11)

## 2011-02-11 LAB — COMPREHENSIVE METABOLIC PANEL
ALT: 23 U/L (ref 0–35)
Alkaline Phosphatase: 60 U/L (ref 39–117)
CO2: 29 mEq/L (ref 19–32)
Calcium: 8.7 mg/dL (ref 8.4–10.5)
Chloride: 90 mEq/L — ABNORMAL LOW (ref 96–112)
GFR calc non Af Amer: >60 mL/min (ref >60)
Glucose, Bld: 100 mg/dL — ABNORMAL HIGH (ref 70–99)
Potassium: 3.8 mEq/L (ref 3.5–5.1)
Sodium: 125 mEq/L — ABNORMAL LOW (ref 135–145)
Total Bilirubin: 0.4 mg/dL (ref 0.3–1.2)

## 2011-02-11 LAB — DIFFERENTIAL
Basophils Absolute: 0 10*3/uL (ref 0.0–0.1)
Lymphocytes Relative: 32 % (ref 12–46)
Monocytes Absolute: 0.3 10*3/uL (ref 0.1–1.0)
Monocytes Relative: 6 % (ref 3–12)
Neutro Abs: 3.5 10*3/uL (ref 1.7–7.7)

## 2011-02-11 LAB — URINALYSIS, ROUTINE W REFLEX MICROSCOPIC
Bilirubin Urine: NEGATIVE
Ketones, ur: NEGATIVE mg/dL
Nitrite: NEGATIVE
Protein, ur: NEGATIVE mg/dL
Urobilinogen, UA: 0.2 mg/dL (ref 0.0–1.0)

## 2011-02-11 LAB — LIPASE, BLOOD: Lipase: 24 U/L (ref 11–59)

## 2011-02-11 LAB — POCT CARDIAC MARKERS: CKMB, poc: <1 ng/mL — ABNORMAL LOW (ref 1.0–8.0)

## 2011-02-11 MED ORDER — IOHEXOL 300 MG/ML  SOLN
100.0000 mL | Freq: Once | INTRAMUSCULAR | Status: AC | PRN
  Administered 2011-02-11: 100 mL via INTRAVENOUS

## 2011-02-11 NOTE — Telephone Encounter (Signed)
Patient called 03/24 approximately 4 pm for complaints of abdominal, chest and back pain that began 1 week ago. Patient states her symptoms have been intermittent and thought may be related to constipation, which she suffers from chronically. Of note, patient was evaluated in the clinic for similar complaints, about a month ago, where she had an abdominal U/S which essentially was normal. She had an abdominal xray 03/05 which showed moderate amount of stool.   Patient states she had a bowel movement today, and yet her pain persists. When asked what is most concerning, she states it is abdominal pain which is located in LUQ and epigastric region. Pt denies symptoms being related to food. Additionally she complains of back pain, in lower back region.  She has a hx of DJD.   Advised patient to come to the ED or urgent care center for further evaluation. Patient agreed and is currently in the ER (triage) awaiting evaluation.

## 2011-02-14 ENCOUNTER — Other Ambulatory Visit (INDEPENDENT_AMBULATORY_CARE_PROVIDER_SITE_OTHER): Payer: Medicare Other | Admitting: *Deleted

## 2011-02-14 DIAGNOSIS — E039 Hypothyroidism, unspecified: Secondary | ICD-10-CM

## 2011-02-14 MED ORDER — METFORMIN HCL ER 500 MG PO TB24: ORAL_TABLET | ORAL | Status: DC

## 2011-02-14 MED ORDER — SYNTHROID 75 MCG PO TABS: 75.0000 ug | ORAL_TABLET | Freq: Every day | ORAL | Status: DC

## 2011-02-14 NOTE — Telephone Encounter (Signed)
Note from pharmacy, pt wants namebrand, must be redone and state namebrand

## 2011-02-14 NOTE — Telephone Encounter (Signed)
Changed as requested and re-prescribed.

## 2011-02-23 ENCOUNTER — Inpatient Hospital Stay (INDEPENDENT_AMBULATORY_CARE_PROVIDER_SITE_OTHER)
Admission: RE | Admit: 2011-02-23 | Discharge: 2011-02-23 | Disposition: A | Discharge status: Home / Self Care | Payer: Medicare Other | Source: Ambulatory Visit | Attending: Family Medicine | Admitting: Family Medicine

## 2011-02-23 ENCOUNTER — Ambulatory Visit (INDEPENDENT_AMBULATORY_CARE_PROVIDER_SITE_OTHER): Payer: Medicare Other

## 2011-02-23 DIAGNOSIS — M549 Dorsalgia, unspecified: Secondary | ICD-10-CM

## 2011-02-23 DIAGNOSIS — M79609 Pain in unspecified limb: Secondary | ICD-10-CM

## 2011-03-01 ENCOUNTER — Ambulatory Visit (INDEPENDENT_AMBULATORY_CARE_PROVIDER_SITE_OTHER): Payer: Medicare Other | Admitting: Internal Medicine

## 2011-03-01 ENCOUNTER — Encounter: Payer: Self-pay | Admitting: Internal Medicine

## 2011-03-01 VITALS — BP 121/73 | HR 82 | Temp 97.3°F | Ht 74.0 in | Wt 218.0 lb

## 2011-03-01 DIAGNOSIS — D509 Iron deficiency anemia, unspecified: Secondary | ICD-10-CM

## 2011-03-01 DIAGNOSIS — K219 Gastro-esophageal reflux disease without esophagitis: Secondary | ICD-10-CM

## 2011-03-01 DIAGNOSIS — J45909 Unspecified asthma, uncomplicated: Secondary | ICD-10-CM

## 2011-03-01 DIAGNOSIS — E7800 Pure hypercholesterolemia, unspecified: Secondary | ICD-10-CM

## 2011-03-01 DIAGNOSIS — E119 Type 2 diabetes mellitus without complications: Secondary | ICD-10-CM

## 2011-03-01 DIAGNOSIS — M545 Low back pain: Secondary | ICD-10-CM

## 2011-03-01 DIAGNOSIS — I10 Essential (primary) hypertension: Secondary | ICD-10-CM

## 2011-03-01 DIAGNOSIS — E871 Hypo-osmolality and hyponatremia: Secondary | ICD-10-CM | POA: Insufficient documentation

## 2011-03-01 DIAGNOSIS — M48062 Spinal stenosis, lumbar region with neurogenic claudication: Secondary | ICD-10-CM | POA: Insufficient documentation

## 2011-03-01 DIAGNOSIS — M48061 Spinal stenosis, lumbar region without neurogenic claudication: Secondary | ICD-10-CM | POA: Insufficient documentation

## 2011-03-01 LAB — CBC WITH DIFFERENTIAL/PLATELET
Basophils Absolute: 0 10*3/uL (ref 0.0–0.1)
Eosinophils Relative: 5 % (ref 0–5)
HCT: 35 % — ABNORMAL LOW (ref 36.0–46.0)
Lymphocytes Relative: 33 % (ref 12–46)
Lymphs Abs: 2.3 10*3/uL (ref 0.7–4.0)
MCV: 88.6 fL (ref 78.0–100.0)
Neutro Abs: 4 10*3/uL (ref 1.7–7.7)
Platelets: 206 10*3/uL (ref 150–400)
RBC: 3.95 MIL/uL (ref 3.87–5.11)
RDW: 13.5 % (ref 11.5–15.5)
WBC: 7 10*3/uL (ref 4.0–10.5)

## 2011-03-01 LAB — LIPID PANEL
LDL Cholesterol: 104 mg/dL — ABNORMAL HIGH (ref 0–99)
Total CHOL/HDL Ratio: 4 Ratio

## 2011-03-01 LAB — GLUCOSE, CAPILLARY: Glucose-Capillary: 124 mg/dL — ABNORMAL HIGH (ref 70–99)

## 2011-03-01 MED ORDER — FERROUS SULFATE 325 (65 FE) MG PO TABS: 325.0000 mg | ORAL_TABLET | Freq: Three times a day (TID) | ORAL | Status: DC

## 2011-03-01 MED ORDER — HYDROMORPHONE HCL 2 MG PO TABS: 2.0000 mg | ORAL_TABLET | Freq: Four times a day (QID) | ORAL | Status: DC | PRN

## 2011-03-01 MED ORDER — TRIAMCINOLONE ACETONIDE 75 MCG/ACT IN AERS: 2.0000 | INHALATION_SPRAY | Freq: Two times a day (BID) | RESPIRATORY_TRACT | Status: DC

## 2011-03-01 NOTE — Assessment & Plan Note (Signed)
Patient's low back pain is markedly worse following a recent fall, and she also has pain radiating into her right lower extremity.  She was seen in January by neurosurgeon Dr. Lovell Sheehan, who discussed surgical options at that time.  Plan is to refill the Hydromorphone for breakthrough pain, and I advised her to continue tramadol for baseline pain relief and to use the hydromorphone only as needed.  I ordered an MRI of her lumbosacral spine, and we have requested a follow up appointment with Dr. Lovell Sheehan.

## 2011-03-01 NOTE — Assessment & Plan Note (Addendum)
Patient is doing well on current inhaler regimen, without any recent or active symptoms.  Plan to continue Azmacort and albuterol inhalers.

## 2011-03-01 NOTE — Progress Notes (Signed)
  Subjective:    Patient ID: Mary Gray, female    DOB: 1961/01/05, 50 y.o.   MRN: [id number]  HPI Patient presents for follow up of her diabetes mellitus, hypertension, hyperlipidemia, and other chronic medical problems.  Her main complaint is worsened low back pain radiating into her right leg following a fall a few days ago, with associated right ankle pain.  She was seen in urgent care on April 5, and an x-ray of her ankle at that time showed no acute abnormality.  She reports that her back pain and right lower showed a pain or severe, much worse than prior to her fall.  She reports that she is compliant with her medications.  Her blood sugars have been reasonably well controlled.   Review of Systems     Objective:   Physical Exam  Constitutional: She appears distressed (Moving onto exam table, lying down, and sitting up provoked back pain.).  Cardiovascular: Normal rate, regular rhythm and normal heart sounds.  Exam reveals no gallop and no friction rub.   No murmur heard. Pulmonary/Chest: Effort normal and breath sounds normal. No respiratory distress. She has no wheezes. She has no rales.  Abdominal: Soft. Bowel sounds are normal. She exhibits no distension. There is tenderness (Mild left abdominal tenderness.). There is no rebound and no guarding.  Musculoskeletal: She exhibits no edema.       Right shoulder: She exhibits no swelling and normal strength.       Straight leg raise provoked back pain on the left, and back pain with radiation down her right lower extremity below the knee on the right.  Neurological:       Lower extremity strength is normal bilateral        Assessment & Plan:

## 2011-03-01 NOTE — Assessment & Plan Note (Signed)
Lipids:    Component Value Date/Time   CHOL 260* 10/05/2010 2033   TRIG 297* 10/05/2010 2033   HDL 44 10/05/2010 2033   LDLCALC 157* 10/05/2010 2033   LDLDIRECT 101* 07/07/2009 2256   VLDL 59* 10/05/2010 2033   CHOLHDL 5.9 Ratio 10/05/2010 2033   Patient is doing well on Crestor with no apparent problems.  The plan is to check a metabolic panel and a lipid panel.

## 2011-03-01 NOTE — Patient Instructions (Signed)
Please keep appointment for follow-up with your neurosurgeon for your back pain.

## 2011-03-01 NOTE — Assessment & Plan Note (Signed)
Sodium  Date Value Range Status  02/11/2011 125* 135-145 (mEq/L) Final   Patient's last metabolic panel on March 24 showed a low sodium of 125.  The cause of this is not clear, but it may be due to her thiazide diuretic.  The plan is to repeat a metabolic panel today; if her sodium is still significantly low, will will hold the hydrochlorothiazide and recheck sodium in a few days.

## 2011-03-01 NOTE — Assessment & Plan Note (Signed)
Patient's symptoms are well controlled on current dose of Nexium; will continue at current dose.

## 2011-03-01 NOTE — Assessment & Plan Note (Signed)
Lab Results  Component Value Date   HGBA1C 5.7 03/01/2011   HGBA1C 6.0 10/05/2010   CREATININE 0.55 02/11/2011   CREATININE 0.63 01/18/2011   MICROALBUR 0.50 04/29/2010   MICRALBCREAT 7.0 04/29/2010   CHOL 260* 10/05/2010   HDL 44 10/05/2010   TRIG 297* 10/05/2010   Assessment: Diabetes control: controlled Progress toward goals: at goal Barriers to meeting goals: no barriers identified  Plan: Diabetes treatment: continue current medications Refer to: none Instruction/counseling given: reminded to get eye exam, reminded to bring blood glucose meter & log to each visit and discussed foot care

## 2011-03-01 NOTE — Assessment & Plan Note (Signed)
Lab Results  Component Value Date   NA 125* 02/11/2011   K 3.8 02/11/2011   CL 90* 02/11/2011   CO2 29 02/11/2011   BUN 10 02/11/2011   CREATININE 0.55 02/11/2011   CREATININE 0.63 01/18/2011    BP Readings from Last 3 Encounters:  03/01/11 121/73  01/18/11 122/75  11/29/10 108/72    Assessment: Hypertension control:  controlled  Progress toward goals:  at goal Barriers to meeting goals:  no barriers identified  Plan: Hypertension treatment:  continue current medications

## 2011-03-01 NOTE — Assessment & Plan Note (Signed)
  Component Value Date/Time   HGB 10.4* 02/11/2011 1956        FERRITIN 54 04/20/2010 2043    Plan is to check a CBC and ferritin level today.

## 2011-03-02 LAB — COMPLETE METABOLIC PANEL WITH GFR
ALT: 21 U/L (ref 0–35)
Albumin: 4.4 g/dL (ref 3.5–5.2)
CO2: 29 mEq/L (ref 19–32)
GFR, Est African American: >60 mL/min (ref >60)
GFR, Est Non African American: >60 mL/min (ref >60)
Glucose, Bld: 103 mg/dL — ABNORMAL HIGH (ref 70–99)
Potassium: 4 mEq/L (ref 3.5–5.3)
Sodium: 134 mEq/L — ABNORMAL LOW (ref 135–145)
Total Protein: 6.8 g/dL (ref 6.0–8.3)

## 2011-03-06 LAB — CARDIAC PANEL(CRET KIN+CKTOT+MB+TROPI)
CK, MB: 1.1 ng/mL (ref 0.3–4.0)
CK, MB: 1.3 ng/mL (ref 0.3–4.0)
Relative Index: INVALID (ref 0.0–2.5)
Relative Index: INVALID (ref 0.0–2.5)
Total CK: 75 U/L (ref 7–177)
Total CK: 77 U/L (ref 7–177)
Troponin I: <0.01 ng/mL (ref 0.00–0.06)

## 2011-03-06 LAB — URINE DRUGS OF ABUSE SCREEN W ALC, ROUTINE (REF LAB)
Amphetamine Screen, Ur: NEGATIVE
Benzodiazepines.: POSITIVE — AB
Creatinine,U: 37.1 mg/dL
Ethyl Alcohol: <10 mg/dL (ref <10)
Marijuana Metabolite: NEGATIVE
Opiate Screen, Urine: NEGATIVE
Propoxyphene: NEGATIVE

## 2011-03-06 LAB — URINALYSIS, ROUTINE W REFLEX MICROSCOPIC
Bilirubin Urine: NEGATIVE
Nitrite: NEGATIVE
Specific Gravity, Urine: 1.006 (ref 1.005–1.030)
Urobilinogen, UA: 0.2 mg/dL (ref 0.0–1.0)
pH: 6 (ref 5.0–8.0)

## 2011-03-06 LAB — CBC
HCT: 33.5 % — ABNORMAL LOW (ref 36.0–46.0)
MCHC: 33.5 g/dL (ref 30.0–36.0)
Platelets: 198 10*3/uL (ref 150–400)
RDW: 13.6 % (ref 11.5–15.5)

## 2011-03-06 LAB — LIPID PANEL
LDL Cholesterol: 71 mg/dL (ref 0–99)
Triglycerides: 344 mg/dL — ABNORMAL HIGH (ref <150)
VLDL: 69 mg/dL — ABNORMAL HIGH (ref 0–40)

## 2011-03-06 LAB — BENZODIAZEPINE, QUANTITATIVE, URINE
Nordiazepam GC/MS Conf: NEGATIVE
Oxazepam GC/MS Conf: NEGATIVE

## 2011-03-06 LAB — GLUCOSE, CAPILLARY
Glucose-Capillary: 118 mg/dL — ABNORMAL HIGH (ref 70–99)
Glucose-Capillary: 136 mg/dL — ABNORMAL HIGH (ref 70–99)

## 2011-03-06 LAB — D-DIMER, QUANTITATIVE: D-Dimer, Quant: 0.22 ug/mL-FEU (ref 0.00–0.48)

## 2011-03-10 ENCOUNTER — Other Ambulatory Visit: Payer: Medicare Other

## 2011-03-16 ENCOUNTER — Ambulatory Visit
Admission: RE | Admit: 2011-03-16 | Discharge: 2011-03-16 | Disposition: A | Discharge status: Home / Self Care | Payer: Medicare Other | Source: Ambulatory Visit | Attending: Internal Medicine | Admitting: Internal Medicine

## 2011-03-16 ENCOUNTER — Ambulatory Visit: Payer: Medicare Other | Admitting: Internal Medicine

## 2011-03-16 DIAGNOSIS — M545 Low back pain: Secondary | ICD-10-CM

## 2011-03-17 ENCOUNTER — Other Ambulatory Visit: Payer: Medicare Other

## 2011-04-04 NOTE — Consult Note (Signed)
Mary Gray, Mary Gray                 ACCOUNT NO.:  [id number]   MEDICAL RECORD NO.:  [id number]          PATIENT TYPE:  INP   LOCATION:  3735                         FACILITY:  [facility]   PHYSICIAN:  Peter M. [place], M.D.  DATE OF BIRTH:  [birth year]   DATE OF CONSULTATION:  12/12/2008  DATE OF DISCHARGE:  12/12/2008                                 CONSULTATION   HISTORY OF PRESENT ILLNESS:  Ms. Metzner is a pleasant 50 year old white  female who was seen at the request of Dr. Polly Cobia for evaluation of  atypical chest pain.  The patient describes a 3-day history of  intermittent substernal chest pain.  She actually describes a sharp,  stabbing-like discomfort in her upper chest that lasted few seconds and  then resolved.  It seems to be worse when she is coughing.  It is  nonradiating.  It is not associated with exertion.  She has had some  lightheadedness and a vague, fluttery sensation in her chest.  She has  had no diaphoresis.  She has had no significant cough for fever and no  hemoptysis.  She denies any shortness of breath.  She has no known  history of cardiac disease.  She does, however, have multiple cardiac  risk factors including history of hypertension, diabetes mellitus type  2, obesity, and hypertriglyceridemia.   PAST MEDICAL HISTORY:  1. Diabetes mellitus type 2.  2. Hypertension.  3. Hypertriglyceridemia.  4. Hypothyroidism.  5. GERD.  6. Chronic low back pain.  7. History of asthma.  8. Anxiety disorder.  9. Carpal tunnel syndrome.   ALLERGIES:  1. CODEINE.  2. PENICILLIN.  3. TETRACYCLINE.  4. MINOCYCLINE.  5. DARVOCET.   MEDICATIONS PRIOR TO ADMISSION:  1. Enalapril 10 mg b.i.d.  2. Albuterol inhaler 2 puffs p.r.n.  3. Azmacort 100 mcg 2 puffs twice daily.  4. Xanax 1 mg 4 times a day.  5. Synthroid 75 mcg daily.  6. Glucophage XR 500 mg 2 tablets in the morning and 1 tablet in the      evening.  7. Lipitor 10 mg daily.  8. Nexium 40 mg per day.  9.  Aspirin 81 mg per day.  10.Flonase 50 mcg 2 sprays each nostril daily.  11.Nystatin powder applied to affected areas twice daily.  12.Metamucil 1 tablespoon daily.  13.Glucotrol 5 mg twice a day.  14.Colace 100 mg twice a day.  15.Carisoprodol 4 times a day for muscle spasm.   SOCIAL HISTORY:  The patient states she is on disability.  She has  Medicaid.  She lives with her sister.   FAMILY HISTORY:  There is no family history of early coronary artery  disease.   REVIEW OF SYSTEMS:  She has chronic low back pain.  She states she is  able to walk well, however and has walked on her mother's treadmill in  the past as well as walking down her driveway.  She denies any edema,  orthopnea, or PND.  She has had no nausea or vomiting.  Denies any  abdominal pain.  No history of TIA or  stroke.  Denies any bleeding  problem.  All other systems were reviewed and are negative.   PHYSICAL EXAMINATION:  GENERAL:  The patient is a well-developed, obese  white female in no apparent distress.  VITAL SIGNS:  She is afebrile.  Blood pressure is 132/76, pulse is 84  and regular, respirations were 20, and saturations were 96% on room air.  HEENT:  She is normocephalic and atraumatic.  Pupils are equal, round,  and reactive to light and accommodation.  Extraocular movements are  full.  Sclerae clear.  Oropharynx is clear.  NECK:  Supple without JVD, adenopathy, thyromegaly, or bruits.  LUNGS:  Clear to auscultation and percussion.  CARDIAC:  Regular rate and rhythm without gallop, murmur, rub, or click.  ABDOMEN:  Morbidly obese, soft, and nontender.  She has good bowel  sounds.  There is no mass or bruits.  EXTREMITIES:  There is no cyanosis, clubbing, or edema.  She has good  pedal pulses.  NEUROLOGIC:  She is alert and oriented x4.  Cranial nerves II through  XII are intact.   LABORATORY DATA:  ECG shows normal sinus rhythm with a tendency of low  voltage, otherwise normal.  Chest x-ray shows no  active disease.  Cardiac enzymes are negative x3.  Drug screen is positive for  benzodiazepines.  Total cholesterol is 173, triglycerides 344, HDL 33,  and LDL of 71.  CBC; white count 6600, hemoglobin 11.2, hematocrit 33.5,  and platelets 198,000.  Urinalysis is negative.  D-dimer is 0.22.   IMPRESSION:  1. Atypical chest pain.  This really does not sound anginal, but we do      need to take into account that she has multiple cardiac risk      factors.  2. Diabetes mellitus type 2.  3. Hypertension.  4. Hypertriglyceridemia.  5. Obesity.   PLAN:  I think it would be reasonable to do a stress Cardiolite study to  further stratify her risk from a cardiac standpoint.  Since she has not  had any significant pain over the last 2 nights and her cardiac workup  here is negative,  I think it will be safe to discharge her to home and we could accomplish  this as an outpatient.  If she is not able to walk effectively on the  treadmill, we could do an adenosine protocol.  Given her obesity, she  will need to be a 2-day protocol.  I will be happy to arrange this.           ______________________________  Peter M. [place], M.D.     PMJ/MEDQ  D:  12/12/2008  T:  12/13/2008  Job:  [id number]   cc:   Lacretia Leigh. Ninetta Lights, M.D.  Zara Council, MD

## 2011-04-07 NOTE — Op Note (Signed)
[place]  Patient:    Mary Gray, Mary Gray                       MRN: [id number] Proc. Date: 05/08/00 Adm. Date:  [id number] Disc. Date: [id number] Attending:  Susa Day CC:         Katy Fitch. Sypher, Montez Hageman., M.D. x 2             Anesthesia                           Operative Report  PREOPERATIVE DIAGNOSIS:  Entrapment neuropathy median nerve carpal tunnel right hand.  POSTOPERATIVE DIAGNOSIS:  Entrapment neuropathy median nerve carpal tunnel right hand.  OPERATION:  Release of right transverse carpal ligament.  SURGEON:  Katy Fitch. Sypher, Montez Hageman., M.D.  ASSISTANT:  None.  ANESTHESIA:  General endotracheal.  SUPERVISING ANESTHESIOLOGIST:  Dr. Krista Blue.  INDICATIONS:  Mary Gray is a 50 year old woman, who is morbidly obese and has signs of severe bilateral carpal tunnel syndrome.  Electrodiagnostic studies confirmed median neuropathy.  Due to a failed response to nonoperative measures, she is brought to the operating room at this time for release of her right transverse carpal ligament.  DESCRIPTION OF PROCEDURE:  Mary Gray is brought to the operating room and placed supine position on the operating room table.  Following induction of general orotracheal anesthesia, the right arm was prepped with Betadine soap solution and sterilely draped.  Following exsanguination of the limb with an Esmarch bandage, arterial tourniquet was placed at 230 mmHg.  Procedure commenced with a short incision in line of the ring finger in the palm. Subcutaneous tissues were carefully divided revealing the palmar fascia.  This was split revealing abundant mid palmar fat.  This was retracted with a Therapist, nutritional revealing the transverse carpal ligament and median nerve.  Once the plane between the nerve and the ligament was carefully established with a Kelly clamp, the ligament was released on its ulnar border subcutaneously into the distal forearm,  including the volar forearm fascia.  This widely opened the carpal canal.  No masses or other predicaments were noted.  Bleeding points were electrocauterized with bipolar current, followed by repair of the skin with intradermal 3-0 Prolene suture.  A compressive dressing was applied with Xeroflo, sterile gauze and sterile Webril.  A volar plaster splint was applied, maintaining the wrist in 5 degrees of dorsiflexion.  Marcaine 0.25% was infiltrated along the wound margins prior to application of the dressing for postoperative analgesia.  AFTERCARE:  Mary Gray was given a prescription for Mepergan fortis 1-2 tablets p.o. q.4-6h. p.r.n. pain, a total of 20 tablets without refill.  She will return to our office in a week for a dressing change and initiation of her exercise program. DD:  05/08/00 TD:  05/10/00 Job: [id number]

## 2011-04-07 NOTE — Assessment & Plan Note (Signed)
PATIENT:  Mary Gray.   DATE OF BIRTH:  Jul 25, 1961.   SURGEON:  Jewel Baize. Stevphen Rochester, M.D.   Clint Strupp [facility] as a referral from  Dr. Gayla Doss. She is a pleasant 50 year old female who comes to [place] complaining  of low back pain, right lateral flank pain, and right radicular component  consistent with 3-4, 4-5, described as infragluteal, with vastus lateralis  component. She comes [place] today with pain level as a 10/10, interfering with  most activities of daily living and quality of life indices. She admits  taking more Dilaudid than prescribed, but pill count is close. She is  finding a modest analgesic capacity from the hydromorphine and has  difficulty with most activities of daily living and restorative sleep  capacity, irrespective of analgesia, and finds that most activities are met  with pain that limit her throughout the day. Particularly driving, she  drives a short distance and then has to take to the bed. She has seen Dr.  Newell Coral who states she is not surgical. Notes attached to the chart are  reviewed, MRI is reviewed, the patient examined, and discussed with nursing  chaperone.   She states recently two weeks ago that she was twisting and felt her back  give out. At that time, she had escalation in the radicular component. She  states no bowel or bladder disorder. She states that she has difficulties  with even ambulating to the bathroom secondary to pain, so she was unable to  take her diuretic. I asked her to discuss this with Dr. Gayla Doss.   Other problems, she is diabetic and morbidly obese and exhibiting early  signs of diabetic neuropathy to the plantar surfaces of her foot. This is  bilateral. She is also having difficulty with considering physical therapy  as she has no endurance, and she is a 12-step program to possibly consider  bariatric surgery, apparently having some difficulties with the 12-step  engagement.   Notes  attached to the chart. Medications attached to the chart which are  extension. She has pain throughout the day. It is made worse by walking,  bending, sitting, and standing. It improves with rest, heat, and  medications. She calls it stabbing, sharp, and constant in aching most of  the time, and she has to walk with a walker and has so for five years. She  is unable to climb steps, and I cautioned as to driving. She prefers a  wheelchair. She is disabled. She has her sister helping with dressing,  bathing, meal prep, household duties and shopping. She apparently has had  social services engaged but will follow along closely in this regard. She  relates modest or bladder weakness, which she has had for years, nothing new  here.   PAST MEDICAL HISTORY:  As noted. She has struggles with diabetes. She has a  wheezing which she describes as lung disease, in a previous smoker. Morbidly  obese. She has known degenerative components in the lower lumbar segments  with diskogenic disease multilevel.   Social history, family, and other medical history attached to the chart.  Review of systems, family and social otherwise noncontributory. Fourteen-  point reviewed   PHYSICAL EXAMINATION:  GENERAL:  Reveals a morbidly obese female, difficulty  with transfers, high level of pain behavior, assisted to the examination  table. She is able to lay flat. She is examined sitting. I do believe  adequate exam was obtained. Vital signs  noted. She is oriented x3. Using a  walker.  HEENT:  Unremarkable.  CHEST:  Has increased AP diameter. Slight wheezing x4. Percussion is fine.  ABDOMEN:  Obese, benign. No obvious hepatosplenomegaly. It is soft. She does  not have any CVA tenderness.  HEART:  She has a regular rate and rhythm without rub, murmur or gallop.  SKIN:  Her integument appears intact, and she has adequate pedal pulses and  capillary filling to her hands and to her feet.   She is _________________  she is Waddell's positive, 3/5. She has depressed  1+ knee and ankle reflexes, and I test for sensory deficit but cannot  appreciate in the lower extremity. Straight leg was impaired, right greater  than left. EHL was fine.   Otherwise, no significant changes neurologically, motor, sensory or  reflexive.   IMPRESSION:  1.  Exogenous obesity.  2.  Diabetes mellitus with early neuropathy.  3.  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   PLAN:  1.  Will obtain MRI to assess progressive position of the lumbar spine.  2.  With full informed consent, obtained MDS[facility], reviewed opiate consent and      patient care agreement.  3.  She has plenty of hydromorphine available, so we will give a couple of      weeks and follow up with her and reassess analgesic need and management.      Will probably switch her over to Duragesic.  4.  Have her continue her bariatric surgical considerations.  5.  Instructed to maintain contact with primary care.  6.  Consider interventional procedures predicated on further database from      particularly the MRI.  7.  Consider adding Lyrica.  8.  I added Lidoderm today. Consider TENS technology.   Discharge instructions given. No barriers to communication. Clearly, our  outcome is going to be defined by her ability to loose weight and  participate in wellness. Interventional procedures will have increased risk  secondary to physical habitus and her diabetes. Will try to maximize a  conservative medical management profile. Discharge instructions understood.      HH/MedQ  D:  02/28/2005 11:49:22  T:  02/28/2005 13:06:30  Job #:  [id number]   cc:   Gayla Doss, M.D.

## 2011-04-07 NOTE — Assessment & Plan Note (Signed)
PATIENT:  Mary Gray.   MEDICAL RECORD NUMBER:  [id number].   DATE OF BIRTH:  10-Jul-1961.   SURGEON:  Jewel Baize. Stevphen Rochester, M.D.   [name] [facility].  I evaluated her  via health and history form and 14-point review of systems.   She is a work in and I assess her.  Chaperoned visit.   1.  She called a number of times to the office and we counsel.  We review      patient care agreement and opioid consent.   1.  She did not bring her medicines in and states the nurse told her not to      bring her medicines in.  This was a nursing visit last week.  As we had      discussed with the nursing staff, we had a plan in place and she was      prescribed Duragesic.  She also brings up the fact that she has talked      to the pharmacist a number of times about transition and we asked her to      defer these questions to use at the appropriate time, not after hours      calling as a emergency.   1.  Will reviewed the MRI with her.  We are still waiting for it to be read.   1.  She has no advancing neurological or musculoskeletal features.  She      states she has almost gone to the ER and I counsel as to using the ER      for pain control or to obtain medications.   1.  I will go ahead and increase her Duragesic for a short term as we      transition her into physical therapy and enhance function and quality of      life and I want her to see her primary care physician regarding a      bariatric surgical referral.  I do not really have much else to offer as      I do not think she is an interventional medicine candidate and I do not      see her improving substantially unless we can get her moving and move      her away from this disabling profile.  Certainly her morbid obesity      could be treated with appropriate approaches from bariatric specialists.      This is our best outcome position.  We are going to stay at Duragesic.      I have asked  her to go home and get her medicine before I release      another prescription.  The patch she has on is taped over and there are      some bubbles in the past, although I cannot clear identify if this is      altered.  I also reviewed the UDS and the urine drug screen does not      identify hydromorphone.  Will call the laboratory to make sure that it      would have been triggered as she should have had some in her at the last      visit.  Consider another UDS.  I am going to have her come back in one      week for a nursing visit.  She will bring her medications in.  At that  time, will obtain a UDS with full informed consent.  Will also assess      efficacy and compliance with physical therapy.   OBJECTIVE:  No significant change in neurologic or musculoskeletal  presentation.  Primarily myofascial and mechanical pain.  No advancing  neurological features.   IMPRESSION:  1.  Exogenous obesity.  2.  Degenerative spinal disease of lumbar spine.   PLAN:  As outlined above.  Discharge instructions given.  Will see her in  followup.  Extensive consultation as to overall directed care approach.  Fashioned care arena.      HH/MedQ  D:  03/14/2005 09:57:58  T:  03/14/2005 11:52:54  Job #:  [id number]   cc:   Ileana Roup, M.D.  [address] [place] [address]  Fax: [phone]   Hewitt Shorts, M.D.  [address]  [place]  [place] [address]  Fax: [phone]

## 2011-04-07 NOTE — Assessment & Plan Note (Signed)
PATIENT:  Mary Gray   DATE OF BIRTH:  [birth year]   [facility] and I evaluated  via the Health and history form, 14 point review of systems.   1.  Mary Gray states she has lost 20 pounds, and she has an appointment to see      her primary care physician regarding bariatric referral. This will be      mandatory for best outcome.  2.  Other modifiable features in health profile discussed, home based      therapy.  3.  We have managed non-narcotic medication alternatives with adequate      relief cycling. I am going to start her on pharmcokinetically long-      acting Tramadol 200 mg and follow along expectantly. I want to avoid      escalating opioid load.  4.  Consider interventional procedures. Apparently Dr. Earl Gala office      declined to see her and I am not really sure why. She had requested to      see him, but I am going to refer her on to Dr. Lovell Sheehan. We need a      surgical assessment, as if she is nonsurgical we will approach her from      a multimodality standpoint, and consider interventional procedures.  5.  She brings her medications in appropriate.  6.  I do not believe further imaging or diagnostics are warranted at this      time. I review the MRI with her.   OBJECTIVE:  Diffuse paralumbar myofascial discomfort. Pain over PSIS is  notable. Pain with extension, side-bending positive. There is nothing new  here on physical examination.   IMPRESSION:  1.  Degenerative spine disease, lumbar spine.  2.  Exogenous obesity.   PLAN:  1.  Refer to Dr. Lovell Sheehan.  2.  Follow up in 1 month.  3.  Ultram CR for pain control.   Discharge instructions reviewed.           ______________________________  Celene Kras, MD     HH/MedQ  D:  09/12/2005 09:52:15  T:  09/12/2005 11:19:04  Job #:  [id number]

## 2011-04-07 NOTE — Discharge Summary (Signed)
Mary Gray, Mary Gray                 ACCOUNT NO.:  [id number]   MEDICAL RECORD NO.:  [id number]          PATIENT TYPE:  INP   LOCATION:  3735                         FACILITY:  [facility]   PHYSICIAN:  Lacretia Leigh. Hatcher, M.D.DATE OF BIRTH:  [birth year]   DATE OF ADMISSION:  12/11/2008  DATE OF DISCHARGE:  12/13/2008                               DISCHARGE SUMMARY   DISCHARGE DIAGNOSES:  1. Chest pain, ruled out for acute coronary syndrome, stress test      planned as outpatient.  2. Headache and dizziness most likely related to viral infection of      the upper respiratory tract.  3. Diabetes.  4. Anxiety.  5. Hypothyroidism.  6. Hypertriglyceridemia.  7. Hypertension.  8. Obesity.  9. History of anxiety disorder.  10.History of asthma.  11.History of degenerative disk disease.   DISCHARGE MEDICATIONS:  1. Enalapril 10 mg 2 tablets daily.  2. Albuterol inhaler.  3. Azmacort inhale 2 puffs b.i.d.  4. Xanax 1 mg 1 tablet by mouth 4 times a day.  5. Synthroid 75 mcg daily.  6. Glucophage 500 mg 2 tablets twice a day.  7. Lipitor 10 mg once a day.  8. Nexium 40 mg 1 capsule by mouth once a day.  9. Aspirin 81 mg once a day.  10.Flonase 50 mcg 2 sprays in each nostril daily.  11.Metamucil powder 1 tablespoon in 8 ounces of water up to 3 times a      day for constipation.  12.Glucotrol 5 mg twice a day.  13.Colace 100 mg twice a day.  14.Carisoprodol 350 mg 1 tablet by mouth 4 times a day as needed for      muscle spasm.   DISPOSITION:  The patient is advised to contact the Outpatient Clinic  for followup appointment.  At the followup appointment, the patient will  be reviewed for resolution of her chest pain, headache, and dizziness.  An outpatient stress test will be arranged in the followup visit.  The  patient will also need long-term primary care followup in terms of her  diabetes, hypertension, and hyperlipidemia.   PROCEDURES/STUDIES DONE DURING THIS ADMISSION:  CT scan of  head without  contrast on December 11, 2008, no acute intracranial abnormalities.   CONSULTS:  Cardiology, Dr. Peter Swaziland.   ADMISSION HISTORY AND PHYSICAL/HISTORY OF PRESENT ILLNESS:  Ms. Demedeiros  is a 50 year old African American woman with hypertension, diabetes, and  hypothyroidism.  She was admitted via the clinic for evaluation of her  chest pain and headache.  The patient mentioned of having sharp  substernal chest pain starting 3 days prior to admission.  It started  gradually and the patient was having multiple episodes lasting few  seconds off and on.  There were no precipitating factors.  The patient  did mention of having pleuritic component to it.  It was nonradiating  without any aggravating or relieving factors.  It was associated with  dizziness, palpitations, diaphoresis, and headache.  The patient denied  cough, fever, hemoptysis, history of pulmonary embolism, or DVT.  She  also denied shortness of  breath associated with it.   The patient also mentioned of headache and dizziness that had been going  on for a few weeks.  It was preceded by a cold syndrome.  The patient  was now having headache and dizziness all the time.  She denied any  blurring of vision or problem with speech or swallowing.  There was no  focal weakness or numbness.  She denied any trauma to head, skin rash,  fever, eye or ear pain, or history of sinusitis.  She also mentioned of  feeling very stressed and she thought it could be related to stress and  anxiety.  She mentioned of 1 episode of near syncope.  She denied loss  of consciousness or seizure-like episodes.  She did not have any  hypoglycemic episodes.   VITAL SIGNS:  On admission, temperature afebrile, blood pressure 152/90,  heart rate 106 per minute, blood pressure laying 163/99 with pulse of  104, sitting 139/97 with pulse of 108, and standing 152/90 with pulse of  106.  GENERAL:  A well developed, well nourished, in no acute  distress.  Alert, appropriate, and cooperative throughout the examination.  HEENT:  Normocephalic and atraumatic.  PERRL, EOMI, ears normal  bilaterally.  NECK:  No JVD.  LUNGS:  Normal respiratory effort.  Normal breath sounds.  No crackles  and no wheezes.  HEART:  Borderline tachycardia.  Regular rhythm.  No murmur, no gallop,  or rubs.  ABDOMEN:  Soft and nontender.  Normal bowel sounds.  EXTREMITIES:  Trace edema bilaterally without cyanosis.  Good pulses.  There is some callus on the lateral aspect of the patient's heel.  NEUROLOGIC:  Alert and oriented x3.  Cranial nerves II-XII intact.  Mildly decreased strength bilaterally upper and lower extremities  symmetrical, likely secondary to poor effort.  Normal finger-to-nose  test.  Negative for Romberg's.  No deficits appreciated.  PSYCHIATRIC:  Anxious.   HOSPITAL COURSE:  1. Chest pain.  The patient was basically admitted to rule out acute      coronary syndrome and pulmonary embolism.  The patient was admitted      to tele floor and her cardiac enzymes were cycled as well as her      electrocardiograms.  The cardiac enzymes were all normal and there      were no new changes on her electrocardiograms.  We also got a D-      dimer on her that was normal and given her low pre-test probability      effectively ruling out pulmonary embolism.  A cardiac consult was      and she was seen by Dr. Peter Swaziland who recommended an outpatient      stress test.  2. Headache and dizziness.  She was not focal on neurological      examination.  Also, the CT scan of the head was normal.  We ruled      out any intracranial serious causes of dizziness.  Also, she was      slightly orthostatic on examination, hence we gave her fluids.  She      responded well, and after overnight observation, she did not      complain of any dizziness.  3. Diabetes.  The patient was continued on sliding scale insulin.  4. Anxiety.  We continued her regular  medications.  5. Hypothyroidism.  Her Synthroid was continued.  6. Hypertriglyceridemia.  We continued her Zocor 40 mg once daily.  7. Hypertension.  She was continued on regular medications.   DISCHARGE DAY LABORATORY STUDIES:  Hemoglobin 11.2, WBC 6.6, and  platelet 198.   DISCHARGE DAY VITAL SIGNS:  Blood pressure 132/76, heart rate 84,  respiratory rate 20, temperature 98.7, and saturation 96% on room air.      Zara Council, MD  Electronically Signed      Lacretia Leigh. Ninetta Lights, M.D.  Electronically Signed    AS/MEDQ  D:  12/14/2008  T:  12/15/2008  Job:  [id number]

## 2011-04-07 NOTE — Assessment & Plan Note (Signed)
HISTORY OF PRESENT ILLNESS:  [facility] to evaluate her Health and History form, 14-point review of  systems.   1.  In the interim, [name] has been a no-show, but has participated in the      restorative program over at [facility].  She has been to the [facility] for a number of GYN related issues and has some type of      ophthalmic problem which she is followed by the ophthalmologist.  She      brings her medications in, stating that she has taken only a few      Dilaudid that were remaining, last one 48 hours ago.  I performed a      careful inventory of medications, review these, observe the medication      she brings in and discuss treatment limitations and options.   1.  I review the MRI with her.  We attempted to contact her, we did not have      a good number, and we are going to refer her on to Dr. Newell Coral.  I am      going to do that now as she has changes in her MRI that I think need to      be evaluated.  Although, no advancing neurological features.  I think      that this is more of a biomechanical pain.  She is deconditioned.  She      is using a walker, had some exaggerated pain behavior, and we may also      need to send her to our pain psychologist.  We will go ahead and handle      controlled substances, but before moving in that arena, I am going to      performed a pharmacy check, call Dr. Meredith Pel.  Will perform a UDS.  This      is all with informed consent.  Hopefully, we can maintain nonnarcotic      medication alternatives.  I do not see any reason she is going to need      to be on prolonged controlled substances.  We will try to treat her from      a multimodality standpoint, ask her if she has had a neurosurgical      assessment.   OBJECTIVE:  Diffuse para lumbar myofascial discomfort.  This is her typical  pain, mostly mechanical.  She has no neurological findings, motor, sensory  or reflexes.   IMPRESSION:  1.  Degenerative spine disease of the lumbar spine.  2.  Facet syndrome.  3.  Exogenous obesity.  4.  Poor health characteristics.   PLAN:  As outlined above.  She is asking to be put back into the physical  therapy program which we may consider, but we had only marginal  effectiveness, and we will get a neurosurgical assessment and then determine  further course of care.  No barrier to communication, discussed with her.  Nursing is present.  Chaperoned visit.  Complexity to control substance management reviewed.  Opioid consent,  patient care agreement and necessity for compliance is enforced.           ______________________________  Celene Kras, MD     HH/MedQ  D:  07/18/2005 11:11:46  T:  07/18/2005 11:34:43  Job #:  [id number]   cc:   Ileana Roup, M.D.  [address]  [place],  [place] [address]  Fax: [phone]   Hewitt Shorts, M.D.  [address]  [place]  [place] [address]  Fax: [phone]

## 2011-04-07 NOTE — Assessment & Plan Note (Signed)
SUBJECTIVE:  Mary Gray [facility] today to  evaluate her Health and History form and 14-point review of systems.   1.  Mary Gray should be commended as she has lost 35.  She knows she has improved      function and quality of life indices.  We are going to have success      maintaining non-narcotic medication alternatives.  She will maintain      contact with primary care physician.  2.  I will increase her Ultram CR to 300 mg, as she notes the winter weather      escalates her pain.  3.  She complains of some myofascial pain in the para lumbar region with      Gaenslen's and Patrick's positive, notably __________ test positive to      the right as well.  This is typical for her pain, probably sacral joint      in nature.  I do not think we need to inject her at this time.  We will      just follow along expectantly, but if she breaks through, will consider      an interventional procedure to minimize escalation of controlled      substances.  4.  Home-based therapy recommended.  5.  Will also continue her on her Flexeril.  She will maintain contact with      our physiatry colleagues.   OBJECTIVE:  Diffuse paracervical myofascial discomfort, positive cervical  facetal compression test, right and left.  Para lumbar position is mostly  myofascial with positive __________ test.  Pain on flexion and extension.  She is still using a walker.  No other overt neurological deficits, motor,  sensory or reflexes.   ASSESSMENT:   IMPRESSION:  Degenerative spine disease lumbar spine, facet syndrome,  exogenous obesity, sacral joint pain.   PLAN:  Conservative management.  Discharge instructions given.  See her in  follow up.  No barriers to communications.           ______________________________  Celene Kras, MD     HH/MedQ  D:  10/10/2005 10:20:58  T:  10/10/2005 11:58:54  Job #:  [id number]

## 2011-04-11 ENCOUNTER — Ambulatory Visit (HOSPITAL_COMMUNITY)
Admission: RE | Admit: 2011-04-11 | Discharge: 2011-04-11 | Disposition: A | Discharge status: Home / Self Care | Payer: Medicare Other | Source: Ambulatory Visit | Attending: Internal Medicine | Admitting: Internal Medicine

## 2011-04-11 ENCOUNTER — Encounter: Payer: Self-pay | Admitting: Internal Medicine

## 2011-04-11 ENCOUNTER — Telehealth: Payer: Self-pay | Admitting: *Deleted

## 2011-04-11 ENCOUNTER — Ambulatory Visit (INDEPENDENT_AMBULATORY_CARE_PROVIDER_SITE_OTHER): Payer: Medicare Other | Admitting: Internal Medicine

## 2011-04-11 VITALS — BP 137/81 | HR 104 | Temp 99.2°F | Ht 61.0 in | Wt 200.3 lb

## 2011-04-11 DIAGNOSIS — J069 Acute upper respiratory infection, unspecified: Secondary | ICD-10-CM

## 2011-04-11 DIAGNOSIS — R0602 Shortness of breath: Secondary | ICD-10-CM | POA: Insufficient documentation

## 2011-04-11 DIAGNOSIS — R059 Cough, unspecified: Secondary | ICD-10-CM | POA: Insufficient documentation

## 2011-04-11 DIAGNOSIS — R05 Cough: Secondary | ICD-10-CM | POA: Insufficient documentation

## 2011-04-11 LAB — GLUCOSE, CAPILLARY: Glucose-Capillary: 112 mg/dL — ABNORMAL HIGH (ref 70–99)

## 2011-04-11 MED ORDER — PREDNISONE 20 MG PO TABS: 40.0000 mg | ORAL_TABLET | Freq: Every day | ORAL | Status: DC

## 2011-04-11 MED ORDER — PREDNISONE 20 MG PO TABS: 20.0000 mg | ORAL_TABLET | Freq: Every day | ORAL | Status: DC

## 2011-04-11 MED ORDER — ALBUTEROL SULFATE HFA 108 (90 BASE) MCG/ACT IN AERS: 2.0000 | INHALATION_SPRAY | RESPIRATORY_TRACT | Status: DC | PRN

## 2011-04-11 MED ORDER — BENZONATATE 100 MG PO CAPS: 100.0000 mg | ORAL_CAPSULE | Freq: Three times a day (TID) | ORAL | Status: AC | PRN

## 2011-04-11 MED ORDER — GUAIFENESIN-CODEINE 100-10 MG/5ML PO SYRP: 5.0000 mL | ORAL_SOLUTION | Freq: Three times a day (TID) | ORAL | Status: DC | PRN

## 2011-04-11 NOTE — Progress Notes (Signed)
  Subjective:    Patient ID: Mary Gray, female    DOB: 04-04-1961, 50 y.o.   MRN: [id number]  HPI  [name] is a 50 year old woman who presents to the clinic today complaining of a sore throat, nasal congestion, chest congestion, and a productive cough that started a little over a day ago.  She states that the morning of the visit she had an oral temperature of 101.  She reports body aches, SOB while sitting, fatigue, drinking a lot of water and eating only chicken broth.  Her brother is currently sick with the same symptoms.  She has a history of asthma and is taking albuterol and flonase.  She states that her albuterol does help with her breathing somewhat.    Review of Systems    Constitutional: Positive for fever, chills, appetite change and fatigue.  Denies diaphoresis  HEENT: Positive for congestion, sore throat, rhinorrhea, sneezing. Denies photophobia, eye pain, redness, hearing loss, ear pain, mouth sores, trouble swallowing, neck pain, neck stiffness and tinnitus.   Respiratory: Positive for SOB and cough Denies DOE, chest tightness,  and wheezing.   Cardiovascular: Denies chest pain, palpitations and leg swelling.  Gastrointestinal: Denies nausea, vomiting, abdominal pain, diarrhea, constipation, blood in stool and abdominal distention.  Genitourinary: Denies dysuria, urgency, frequency, hematuria, flank pain and difficulty urinating.  Musculoskeletal: Denies myalgias, back pain, joint swelling, arthralgias and gait problem.  Skin: Denies pallor, rash and wound.  Neurological: Denies dizziness, seizures, syncope, weakness, light-headedness, numbness and headaches.  Hematological: Denies adenopathy. Easy bruising, personal or family bleeding history  Psychiatric/Behavioral: Denies suicidal ideation, mood changes, confusion, nervousness, sleep disturbance and agitation  Objective:   Physical Exam    Constitutional: Vital signs reviewed.  Patient is a well-developed and  well-nourished woman in no acute distress and cooperative with exam. Alert and oriented x3.  Head: Normocephalic and atraumatic Ear: TM normal bilaterally Mouth: no erythema or exudates, MMM Eyes: PERRL, EOMI, conjunctivae normal, No scleral icterus.  Neck: Supple, Trachea midline normal ROM, No JVD, mass, thyromegaly, or carotid bruit present.  Cardiovascular: RRR, S1 normal, S2 normal, no MRG, pulses symmetric and intact bilaterally Pulmonary/Chest: diffuse bilateral wheezes in all lung fields.  No rales, or rhonchi.  Positive cough with deep inspiration.   Abdominal: Soft. Non-tender, non-distended, bowel sounds are normal, no masses, organomegaly, or guarding present.  GU: no CVA tenderness Musculoskeletal: No joint deformities, erythema, or stiffness, ROM full and no nontender Neurological: A&O x3, Strenght is grossly normal, cranial nerve II-XII are grossly intact, no focal motor deficit,  Skin: Warm, dry and intact. No rash, cyanosis, or clubbing.    Assessment & Plan:

## 2011-04-11 NOTE — Patient Instructions (Addendum)
Upper Respiratory Infections (Cold) A viral respiratory infection is also known as a cold. The symptoms can include runny or stuffy nose, fever, sore throat and cough. Many different viruses can cause colds. Antibiotics are not helpful in colds unless there is a secondary bacterial infection such as an ear infection, sinusitis, or bronchitis. Treatment to relieve cold symptoms include:  Bed rest.   Increase fluid intake.   Use a vaporizer.   Use oral decongestants and throat lozenges.   Use nose spray decongestants (Do not use for more than 3 days).   Take over-the-counter medication for pain and fever as directed by your caregiver.   To prevent the spread of colds to other family members, wash your hands often, especially after coughing or touching your nose or mouth. Use disposable tissues instead of handkerchiefs.  CALL YOUR CAREGIVER IF:  You or your child has an oral temperature above 102 F (38.9 C).   Your baby is older than 3 months with a rectal temperature of 100.5 F (38.1 C) or higher for more than 1 day.   You develop symptoms that make you feel that something more than a cold has developed.   You have a headache or cough that lasts for more than 3 days.  SEEK IMMEDIATE MEDICAL CARE IF:  You have severe pain.   You develop lightheadedness or have a fainting episode.   You have chest pain, chest pressure, or shortness of breath.   You have repeated vomiting and/or diarrhea.   You or your child has an oral temperature above 102 F (38.9 C), not controlled by medicine.   Your baby is older than 3 months with a rectal temperature of 102 F (38.9 C) or higher.   Your baby is 28 months old or younger with a rectal temperature of 100.4 F (38 C) or higher.  Document Released: 11/06/2005 Document Re-Released: 09/03/2009 ExitCare Patient Information 2011 [place], [place].  1. Take the Prednisone 20 mg tablets 2 tablets once daily for 5 days then stop.  2. You can  use your albuterol inhaler every 4 hours for the next week to help with your breathing.    3. You can take tylenol 500 mg tablets take 2 tablets every 6 hours as needed for the body aches and fever  4. You can use vick vaporub or cough drops to help with the cough as well.    5.  You can use the cough syrup or the tesselon perrles for the cough.  6.  This will slowly get better over the next week or two.

## 2011-04-11 NOTE — Telephone Encounter (Signed)
Agree with plan to evaluate Mary Gray in the clinic.

## 2011-04-11 NOTE — Telephone Encounter (Signed)
Pt calls to c/o of temp at appr 0200 of 101, congested, sore throat, weak, feels "short of breath". This has been going on for appr 24 hrs. She is given an appt for 1445 w/ dr Tonny Branch and ask if she becomes worse or more concerned to call 911 or go to ED or urg care, she is agreeable. Nothing alleviates, exercise aggravates.

## 2011-04-14 ENCOUNTER — Telehealth (INDEPENDENT_AMBULATORY_CARE_PROVIDER_SITE_OTHER): Payer: Medicare Other | Admitting: *Deleted

## 2011-04-14 DIAGNOSIS — J069 Acute upper respiratory infection, unspecified: Secondary | ICD-10-CM

## 2011-04-14 MED ORDER — LEVOFLOXACIN 500 MG PO TABS: 500.0000 mg | ORAL_TABLET | Freq: Every day | ORAL | Status: DC

## 2011-04-14 MED ORDER — GUAIFENESIN-CODEINE 100-10 MG/5ML PO SYRP: 5.0000 mL | ORAL_SOLUTION | Freq: Three times a day (TID) | ORAL | Status: AC | PRN

## 2011-04-14 MED ORDER — MOXIFLOXACIN HCL 400 MG PO TABS: 400.0000 mg | ORAL_TABLET | Freq: Every day | ORAL | Status: DC

## 2011-04-14 MED ORDER — PREDNISONE 20 MG PO TABS: 40.0000 mg | ORAL_TABLET | Freq: Every day | ORAL | Status: AC

## 2011-04-14 NOTE — Telephone Encounter (Signed)
Pt called stating she was seen in clinic on 5/22 for resp. Problem. She was put on prednisone, cough med and inhaler. She is also taking aleve, without relief. She is very weak ( states she can hardly walk across floor ), cough, when she is flat continues to cough, sore throat, ear ache, chest and back hurt. She is not able to cough anything up. She is not sure about temp, face is flushed.  Pt # [id number]

## 2011-04-14 NOTE — Telephone Encounter (Signed)
Addended by: Leodis Sias on: 04/14/2011 05:00 PM   Modules accepted: Orders

## 2011-04-14 NOTE — Assessment & Plan Note (Signed)
Her symptoms are consistent with a URI causing an asthma exacerbation.  Chest x-ray was done to rule out pneumonia and showed no infiltrate.  We will plan on treating symptomatically for now with a short course of prednisone, tessalon perrles, and continuing to use her albuterol as needed.  We also discussed vaporizers, and keeping up with her fluid intake.  She was advised that if she didn't feel better in a few days to call and we would consider an antibiotic at that time.Marland Kitchen

## 2011-04-14 NOTE — Telephone Encounter (Signed)
Rx called in 

## 2011-04-14 NOTE — Telephone Encounter (Signed)
Spoke with the patient on the phone and she states that she is not feeling any better over the last 3 days with the medicines we gave her the day of her visit.  She hasn't been taking her temperature but states she has been sweating a lot and had shaking chills several times.  We will have her try a course of antibiotics, Avelox 400 mg daily for 7 days.  We will also continue the prednisone 40 mg daily for an additional 5 days.  She was told to continue working on drinking a lot of fluids and staying hydrated.  She was warned that if she does not feel better in about 3-4 days she should be seen in the clinic or go to the ER for evaluation.    I also forgot to give her the print out prescription for her robitussin AC cough syrup so I will have the nurse call it in to her pharmacy.

## 2011-04-14 NOTE — Telephone Encounter (Signed)
Pharmacy called and insurance will not cover avelox. Will call in levoquin per Dr Tonny Branch

## 2011-04-20 ENCOUNTER — Emergency Department (HOSPITAL_COMMUNITY): Payer: Medicare Other

## 2011-04-20 ENCOUNTER — Emergency Department (HOSPITAL_COMMUNITY)
Admission: EM | Admit: 2011-04-20 | Discharge: 2011-04-21 | Disposition: A | Discharge status: Home / Self Care | Payer: Medicare Other | Attending: Emergency Medicine | Admitting: Emergency Medicine

## 2011-04-20 DIAGNOSIS — F411 Generalized anxiety disorder: Secondary | ICD-10-CM | POA: Insufficient documentation

## 2011-04-20 DIAGNOSIS — J4489 Other specified chronic obstructive pulmonary disease: Secondary | ICD-10-CM | POA: Insufficient documentation

## 2011-04-20 DIAGNOSIS — R079 Chest pain, unspecified: Secondary | ICD-10-CM | POA: Insufficient documentation

## 2011-04-20 DIAGNOSIS — R0609 Other forms of dyspnea: Secondary | ICD-10-CM | POA: Insufficient documentation

## 2011-04-20 DIAGNOSIS — R059 Cough, unspecified: Secondary | ICD-10-CM | POA: Insufficient documentation

## 2011-04-20 DIAGNOSIS — K219 Gastro-esophageal reflux disease without esophagitis: Secondary | ICD-10-CM | POA: Insufficient documentation

## 2011-04-20 DIAGNOSIS — Z79899 Other long term (current) drug therapy: Secondary | ICD-10-CM | POA: Insufficient documentation

## 2011-04-20 DIAGNOSIS — R070 Pain in throat: Secondary | ICD-10-CM | POA: Insufficient documentation

## 2011-04-20 DIAGNOSIS — E789 Disorder of lipoprotein metabolism, unspecified: Secondary | ICD-10-CM | POA: Insufficient documentation

## 2011-04-20 DIAGNOSIS — R0989 Other specified symptoms and signs involving the circulatory and respiratory systems: Secondary | ICD-10-CM | POA: Insufficient documentation

## 2011-04-20 DIAGNOSIS — I10 Essential (primary) hypertension: Secondary | ICD-10-CM | POA: Insufficient documentation

## 2011-04-20 DIAGNOSIS — J449 Chronic obstructive pulmonary disease, unspecified: Secondary | ICD-10-CM | POA: Insufficient documentation

## 2011-04-20 DIAGNOSIS — J3489 Other specified disorders of nose and nasal sinuses: Secondary | ICD-10-CM | POA: Insufficient documentation

## 2011-04-20 DIAGNOSIS — IMO0001 Reserved for inherently not codable concepts without codable children: Secondary | ICD-10-CM | POA: Insufficient documentation

## 2011-04-20 DIAGNOSIS — J069 Acute upper respiratory infection, unspecified: Secondary | ICD-10-CM | POA: Insufficient documentation

## 2011-04-20 DIAGNOSIS — E039 Hypothyroidism, unspecified: Secondary | ICD-10-CM | POA: Insufficient documentation

## 2011-04-20 DIAGNOSIS — E119 Type 2 diabetes mellitus without complications: Secondary | ICD-10-CM | POA: Insufficient documentation

## 2011-04-20 DIAGNOSIS — R05 Cough: Secondary | ICD-10-CM | POA: Insufficient documentation

## 2011-04-20 DIAGNOSIS — G8929 Other chronic pain: Secondary | ICD-10-CM | POA: Insufficient documentation

## 2011-04-20 LAB — URINALYSIS, ROUTINE W REFLEX MICROSCOPIC
Bilirubin Urine: NEGATIVE
Hgb urine dipstick: NEGATIVE
Ketones, ur: NEGATIVE mg/dL
Nitrite: NEGATIVE
Specific Gravity, Urine: 1.005 (ref 1.005–1.030)
pH: 8 (ref 5.0–8.0)

## 2011-04-26 ENCOUNTER — Telehealth: Payer: Self-pay | Admitting: *Deleted

## 2011-04-26 NOTE — Telephone Encounter (Signed)
Pt called with c/o wheezing, cough, headache, ear ache.  Onset 5/22, she was treated but not better. She denies fever, but can't sleep.  She had appointment tomorrow.  Pt called in earlier today to be seen today but when I called her back X2 she did not answer the phone. This time she called in at 1600, we can  Not see today.

## 2011-04-27 ENCOUNTER — Ambulatory Visit (INDEPENDENT_AMBULATORY_CARE_PROVIDER_SITE_OTHER): Payer: Medicare Other | Admitting: Internal Medicine

## 2011-04-27 ENCOUNTER — Encounter: Payer: Self-pay | Admitting: Internal Medicine

## 2011-04-27 VITALS — BP 126/75 | HR 94 | Temp 97.6°F | Ht 61.0 in | Wt 203.6 lb

## 2011-04-27 DIAGNOSIS — J309 Allergic rhinitis, unspecified: Secondary | ICD-10-CM

## 2011-04-27 DIAGNOSIS — M545 Low back pain: Secondary | ICD-10-CM

## 2011-04-27 DIAGNOSIS — J069 Acute upper respiratory infection, unspecified: Secondary | ICD-10-CM

## 2011-04-27 DIAGNOSIS — J45909 Unspecified asthma, uncomplicated: Secondary | ICD-10-CM

## 2011-04-27 MED ORDER — HYDROMORPHONE HCL 2 MG PO TABS: 2.0000 mg | ORAL_TABLET | Freq: Four times a day (QID) | ORAL | Status: DC | PRN

## 2011-04-27 MED ORDER — TRAMADOL HCL 50 MG PO TABS: 50.0000 mg | ORAL_TABLET | Freq: Four times a day (QID) | ORAL | Status: DC | PRN

## 2011-04-27 MED ORDER — FLUTICASONE PROPIONATE 50 MCG/ACT NA SUSP: 2.0000 | Freq: Every day | NASAL | Status: DC | PRN

## 2011-04-27 MED ORDER — GUAIFENESIN-CODEINE 100-10 MG/5ML PO SYRP: 5.0000 mL | ORAL_SOLUTION | Freq: Every evening | ORAL | Status: AC | PRN

## 2011-04-27 MED ORDER — FLUTICASONE PROPIONATE (INHAL) 50 MCG/BLIST IN AEPB: 1.0000 | INHALATION_SPRAY | Freq: Two times a day (BID) | RESPIRATORY_TRACT | Status: DC

## 2011-04-27 NOTE — Assessment & Plan Note (Signed)
As her visit was ending the patient stated that she has been having increasing lower back pain. She states that she has met with Dr. Lovell Sheehan the neurosurgeon and is considering neurosurgery. She not report any alarm symptoms. She stated that she would like refills of her Dilaudid. She states she's been trying to take tramadol but this is not fully controlling her pain. I encouraged her to continue to use this. I explained to her that I would give her a very limited prescription of 15 tablets of Dilaudid but if in the future should she need more pain medicine that she should have this prescribed to her by Dr. Meredith Pel as he is her primary care provider.  She was agreeable.

## 2011-04-27 NOTE — Patient Instructions (Addendum)
Try to stay away from the dogs for 1 week. Also try to clean up the dust in the house.  With your new prescription of inhaled steroid, try it twice a day for a week, and it is safe to continue longer if it helps.  Rinse your mouth afterwards. Keep your appointment with Dr. Meredith Pel. No more than 4000mg  of tylenol a day. You may purchase a spacer from your local pharmacy.

## 2011-04-27 NOTE — Assessment & Plan Note (Signed)
Evaluate the patient with Dr. Aundria Rud today. The patient does not have any signs of pneumonia on exam and therefore is not need another antibiotic today, nor another chest x-ray. She has chronic lung disease both from asthma and from smoking. Fortunately she quit smoking 14 years ago. She appears to be at the end of a bad upper respiratory infection which has been made difficult to treat because of her long-standing asthma.   It is important for [place] to maximize her asthma treatment. We encouraged her to remove dust from her home and stay away from her dogs for one week. We will try an oral steroid twice daily for a week which she may continue until she follows up with Dr. Alona Bene if she finds that it helps her. I will also give her a refill of her cough medicine which she should try using only at night.

## 2011-04-27 NOTE — Progress Notes (Signed)
  Subjective:    Patient ID: Mary Gray, female    DOB: 11/23/1960, 50 y.o.   MRN: [id number]  HPI Comments: Patient states she has had for the past 3 weeks she has had cough productive of small amount green/brown sputum, congestion with rhinorrhea (greenish, non-bloody), cannot sleep, ear pain, headache, eyes sore, weak, tired.  No diarrhea.  No abdominal pain.  Some gagging with cough.  Feels nausaeted and has vomited small amounts of watery liquid. Has been hungry, eating clear liquids.   No fevers (highest temp ~99).  Evaluated at [facility] on 04/11/11, then at ED 04/20/11.  CXR in [facility] and ED without infiltrate.  Has tried tussionex (only thing that has helped), levaquin (Rx'd by Dr. Tonny Branch as pt's symptoms not improved after initial evaluation, finished on June 2), 60mg  prednisone x4 days (last day June 4), tessalon pearles, tylenol, delsyn.  Has been taking albuterol and atrovent 4 times a day.    Wants [place] to know that vicodin given to her by the ED physician made her feel "crazy".   Pt has had asthma diagnosis for more than 10 years.  Last cigarette 14 years ago.  Lives with 2 dogs.    URI  Pertinent negatives include no dysuria or neck pain.      Review of Systems  Constitutional: Negative for chills.  HENT: Negative for neck pain and neck stiffness.   Eyes: Negative for visual disturbance.  Cardiovascular: Negative for palpitations.  Gastrointestinal: Negative for abdominal distention.  Genitourinary: Negative for dysuria, urgency and frequency.  Neurological: Negative for dizziness.  Hematological: Negative for adenopathy.  Psychiatric/Behavioral: Negative for behavioral problems.       Objective:   Physical Exam  Constitutional: She is oriented to person, place, and time. No distress.       Appears congested, coughs during interview  HENT:  Head: Normocephalic and atraumatic.  Nose: Nose normal.  Mouth/Throat: Oropharynx is clear and moist. No oropharyngeal exudate.   Some chronic-appearing changes/dullness to bilateral TMs without erythema or swelling  Eyes: Conjunctivae and EOM are normal. Pupils are equal, round, and reactive to light. Right eye exhibits no discharge. Left eye exhibits no discharge.  Neck: Normal range of motion. Neck supple.  Cardiovascular: Normal rate, regular rhythm and intact distal pulses.   No murmur heard. Pulmonary/Chest: Effort normal and breath sounds normal. No respiratory distress. She has no wheezes.  Abdominal: Soft. Bowel sounds are normal. She exhibits no distension and no mass. There is no tenderness. There is no rebound and no guarding.  Musculoskeletal: Normal range of motion. She exhibits no edema and no tenderness.  Lymphadenopathy:    She has no cervical adenopathy.  Neurological: She is alert and oriented to person, place, and time. No cranial nerve deficit. Coordination normal.  Skin: Skin is warm and dry. No rash noted.  Psychiatric: She has a normal mood and affect. Her behavior is normal. Judgment and thought content normal.          Assessment & Plan:

## 2011-05-03 ENCOUNTER — Other Ambulatory Visit (INDEPENDENT_AMBULATORY_CARE_PROVIDER_SITE_OTHER): Payer: Medicare Other | Admitting: Internal Medicine

## 2011-05-03 DIAGNOSIS — K219 Gastro-esophageal reflux disease without esophagitis: Secondary | ICD-10-CM

## 2011-05-04 ENCOUNTER — Other Ambulatory Visit (INDEPENDENT_AMBULATORY_CARE_PROVIDER_SITE_OTHER): Payer: Medicare Other | Admitting: Internal Medicine

## 2011-05-04 DIAGNOSIS — K219 Gastro-esophageal reflux disease without esophagitis: Secondary | ICD-10-CM

## 2011-05-04 NOTE — Telephone Encounter (Signed)
I refilled this medication yesterday.  Please confirm that the pharmacy received the refill.

## 2011-05-30 ENCOUNTER — Other Ambulatory Visit: Payer: Self-pay | Admitting: *Deleted

## 2011-05-30 MED ORDER — CARISOPRODOL 350 MG PO TABS: 350.0000 mg | ORAL_TABLET | Freq: Four times a day (QID) | ORAL | Status: DC | PRN

## 2011-05-30 NOTE — Telephone Encounter (Signed)
Refill called to [facility].

## 2011-05-30 NOTE — Telephone Encounter (Signed)
Refill approved - nurse to complete. 

## 2011-05-31 ENCOUNTER — Encounter: Payer: Self-pay | Admitting: Internal Medicine

## 2011-05-31 ENCOUNTER — Ambulatory Visit (INDEPENDENT_AMBULATORY_CARE_PROVIDER_SITE_OTHER): Payer: Medicare Other | Admitting: Internal Medicine

## 2011-05-31 VITALS — BP 118/72 | HR 83 | Temp 97.2°F | Resp 20 | Ht 60.0 in | Wt 217.0 lb

## 2011-05-31 DIAGNOSIS — M4800 Spinal stenosis, site unspecified: Secondary | ICD-10-CM

## 2011-05-31 DIAGNOSIS — I10 Essential (primary) hypertension: Secondary | ICD-10-CM

## 2011-05-31 DIAGNOSIS — E7800 Pure hypercholesterolemia, unspecified: Secondary | ICD-10-CM

## 2011-05-31 DIAGNOSIS — B373 Candidiasis of vulva and vagina: Secondary | ICD-10-CM

## 2011-05-31 DIAGNOSIS — J45909 Unspecified asthma, uncomplicated: Secondary | ICD-10-CM

## 2011-05-31 DIAGNOSIS — E119 Type 2 diabetes mellitus without complications: Secondary | ICD-10-CM

## 2011-05-31 LAB — GLUCOSE, CAPILLARY: Glucose-Capillary: 148 mg/dL — ABNORMAL HIGH (ref 70–99)

## 2011-05-31 LAB — POCT GLYCOSYLATED HEMOGLOBIN (HGB A1C): Hemoglobin A1C: 6.4

## 2011-05-31 MED ORDER — ROSUVASTATIN CALCIUM 5 MG PO TABS: 10.0000 mg | ORAL_TABLET | Freq: Every day | ORAL | Status: DC

## 2011-05-31 MED ORDER — FLUCONAZOLE 150 MG PO TABS: 150.0000 mg | ORAL_TABLET | Freq: Once | ORAL | Status: DC

## 2011-05-31 MED ORDER — HYDROMORPHONE HCL 2 MG PO TABS: 2.0000 mg | ORAL_TABLET | Freq: Two times a day (BID) | ORAL | Status: DC | PRN

## 2011-05-31 NOTE — Assessment & Plan Note (Signed)
Patient has chronic low back pain, and reports that this is not completely relieved by tramadol.  She requested a refill of oral hydromorphone, which is one of the few opioid medications she can tolerate.  I gave her a refill for #30 with instructions to use the medication sparingly and only for breakthrough pain.

## 2011-05-31 NOTE — Progress Notes (Signed)
  Subjective:    Patient ID: Mary Gray, female    DOB: March 03, 1961, 50 y.o.   MRN: [id number]  HPI Patient returns for followup of her asthma, diabetes mellitus, hypercholesterolemia, and other chronic medical problems.  She was seen last month in the outpatient clinic here with symptoms other than the asthma exacerbation; her symptoms have now improved and she is doing well on her current inhaler regimen.  Her blood sugars have been well controlled.  She has chronic low back pain, and reports that Tylenol is not completely relieving her pain; she requested a refill of oral hydromorphone for breakthrough pain.  Patient also inquired about erythema of her legs which she noted earlier this month and which has improved significantly.   Review of Systems     Objective:   Physical Exam  Constitutional: No distress.  Cardiovascular: Normal rate, regular rhythm and normal heart sounds.  Exam reveals no gallop and no friction rub.   No murmur heard. Pulmonary/Chest: Breath sounds normal. She has no wheezes. She has no rales.  Abdominal: Soft. Bowel sounds are normal. There is no tenderness. There is no rebound and no guarding.  Musculoskeletal: She exhibits no edema.  Skin: There is erythema (Mild erythema of both legs, ends at sock line.).          Assessment & Plan:

## 2011-05-31 NOTE — Patient Instructions (Signed)
Increase Crestor 5 mg to a dose of 2 tablets daily.

## 2011-05-31 NOTE — Assessment & Plan Note (Signed)
Lab Results  Component Value Date   NA 134* 03/01/2011   K 4.0 03/01/2011   CL 93* 03/01/2011   CO2 29 03/01/2011   BUN 12 03/01/2011   CREATININE 0.57 03/01/2011   CREATININE 0.55 02/11/2011    BP Readings from Last 3 Encounters:  05/31/11 118/72  04/27/11 126/75  04/11/11 137/81    Assessment: Hypertension control:  controlled  Progress toward goals:  at goal Barriers to meeting goals:  no barriers identified  Plan: Hypertension treatment:  continue current medications

## 2011-05-31 NOTE — Assessment & Plan Note (Signed)
Lab Results  Component Value Date   HGBA1C 6.4 05/31/2011   HGBA1C 6.0 10/05/2010   CREATININE 0.57 03/01/2011   CREATININE 0.55 02/11/2011   MICROALBUR 0.50 04/29/2010   MICRALBCREAT 7.0 04/29/2010   CHOL 189 03/01/2011   HDL 47 03/01/2011   TRIG 192* 03/01/2011     Assessment: Diabetes control: controlled Progress toward goals: at goal Barriers to meeting goals: no barriers identified  Plan: Diabetes treatment: continue current medications Refer to: none Instruction/counseling given: discussed foot care

## 2011-05-31 NOTE — Assessment & Plan Note (Signed)
Patient reports recent symptoms of vaginal itching and a small amount of discharge consistent with past vaginal yeast infections which have responded to oral fluconazole.  She denies any pain, bleeding, fever, or other symptoms.  The plan is to treat with oral fluconazole 150 mg x1 dose.  I advised her to let me know if her symptoms persist, in which case she will need a pelvic exam.

## 2011-05-31 NOTE — Assessment & Plan Note (Signed)
Patient is doing well on current inhaler regimen.  Plan is to continue at current dose.

## 2011-05-31 NOTE — Assessment & Plan Note (Signed)
Lipids:    Component Value Date/Time   CHOL 189 03/01/2011 1223   TRIG 192* 03/01/2011 1223   HDL 47 03/01/2011 1223   LDLCALC 104* 03/01/2011 1223   LDLDIRECT 101* 07/07/2009 2256   VLDL 38 03/01/2011 1223   CHOLHDL 4.0 03/01/2011 1223    Last LDL was above target range.  Patient is doing well on Crestor 5 mg daily.  The plan is to increase to a dose of Crestor 10 mg daily.

## 2011-06-01 LAB — COMPLETE METABOLIC PANEL WITH GFR
ALT: 14 U/L (ref 0–35)
AST: 16 U/L (ref 0–37)
Albumin: 4.5 g/dL (ref 3.5–5.2)
Calcium: 9.5 mg/dL (ref 8.4–10.5)
Chloride: 92 mEq/L — ABNORMAL LOW (ref 96–112)
Potassium: 3.6 mEq/L (ref 3.5–5.3)
Sodium: 132 mEq/L — ABNORMAL LOW (ref 135–145)

## 2011-06-02 ENCOUNTER — Other Ambulatory Visit: Payer: Self-pay | Admitting: Internal Medicine

## 2011-06-02 NOTE — Telephone Encounter (Signed)
Refilled one year

## 2011-06-12 ENCOUNTER — Telehealth: Payer: Self-pay | Admitting: *Deleted

## 2011-06-12 NOTE — Telephone Encounter (Signed)
Pt calls and states she has a rash since sun on her bil lower legs, the rash is between her knees and ankles, the lesions are between the size of pennies and nickels, red and burn, she states she has not used any new products nor can relate this to shaving of the legs. appt is set for wed 7/25 at 0915

## 2011-06-13 NOTE — Telephone Encounter (Signed)
Agree with plan 

## 2011-06-14 ENCOUNTER — Ambulatory Visit (INDEPENDENT_AMBULATORY_CARE_PROVIDER_SITE_OTHER): Payer: Medicare Other | Admitting: Internal Medicine

## 2011-06-14 ENCOUNTER — Encounter: Payer: Self-pay | Admitting: Internal Medicine

## 2011-06-14 DIAGNOSIS — L270 Generalized skin eruption due to drugs and medicaments taken internally: Secondary | ICD-10-CM

## 2011-06-14 DIAGNOSIS — E119 Type 2 diabetes mellitus without complications: Secondary | ICD-10-CM

## 2011-06-14 DIAGNOSIS — I10 Essential (primary) hypertension: Secondary | ICD-10-CM

## 2011-06-14 MED ORDER — SERTRALINE HCL 100 MG PO TABS: 100.0000 mg | ORAL_TABLET | Freq: Every day | ORAL | Status: DC

## 2011-06-14 MED ORDER — FLUTICASONE PROPIONATE 0.05 % EX CREA: TOPICAL_CREAM | CUTANEOUS | Status: DC

## 2011-06-14 MED ORDER — ENALAPRIL MALEATE 10 MG PO TABS: 20.0000 mg | ORAL_TABLET | Freq: Every day | ORAL | Status: DC

## 2011-06-14 MED ORDER — IPRATROPIUM BROMIDE HFA 17 MCG/ACT IN AERS: 2.0000 | INHALATION_SPRAY | Freq: Four times a day (QID) | RESPIRATORY_TRACT | Status: DC

## 2011-06-14 NOTE — Patient Instructions (Signed)
Stop hydrochlorothiazide Start taking Enalapril 20mg  one tablet daily You can apply Cutivate 0.005% cream twice daily to affected area as needed, avoid face, axilla, inguinal areas Follow up with Dr. Meredith Pel in 2 months or return sooner if symptoms are worsen

## 2011-06-14 NOTE — Assessment & Plan Note (Signed)
Not well controlled.  Patient has been on enalapril 10 mg by mouth daily and HCTZ 12.5 mg by mouth daily.  Patient has been having intermittent rash which is slightly secondary to drug reaction from HCTZ.   -Will stop HCTZ today -Increase enalapril to 20 mg by mouth daily -Patient is to followup with Dr. Meredith Pel in August

## 2011-06-14 NOTE — Progress Notes (Signed)
History of present illness: [name] is a 50 year old woman with COPD, hypertension presents for rash on her lower legs. Patient states that she has been having this rash intermittently in the past several weeks.  The rash has worsened on Friday, 06/09/1999 with more swelling and redness. However, the rash has improved over the weekend and that she only has mild scattered erythema and she has been using hydrocortisone over-the-counter cream.  The rash is mainly on her lower extremities but she started to develop a rash on her right upper extremity.  She denies any tenderness, itching, purulent discharge, fever, chills, nausea or vomiting, abdominal pain, tick bites, change in detergent/soap, recent travel.  She does report having a few mosquito bites in which she used some spray for it. She reports taking HCTZ for many years.  No other complaints today.  ROS: As per history of present illness  Physical examination:  General: alert, well-developed, and cooperative to examination.  Lungs: normal respiratory effort, no accessory muscle use, normal breath sounds, no crackles, and no wheezes. Heart: normal rate, regular rhythm, no murmur, no gallop, and no rub.  Abdomen: soft, non-tender, normal bowel sounds, no distention, no guarding, no rebound tenderness Msk: no joint swelling, no joint warmth, and no redness over joints.  Pulses: 2+ DP/PT pulses bilaterally Neurologic: alert & oriented X3, cranial nerves II-XII intact, strength normal in all extremities, sensation intact to light touch, and gait normal.  Skin: Scattered morbilliform rash on lower extremities bilaterally, nontender, mild edema (chronic), no clear/ purulent discharge. Faint will biliform rash on right upper extremity.   Psych: Oriented X3, memory intact for recent and remote, normally interactive, good eye contact, not anxious appearing, and not depressed appearing.

## 2011-06-14 NOTE — Assessment & Plan Note (Signed)
Rash appearance is consistent with morbilliform drug reaction rash which is likely caused by hydrochlorothiazide even though patient has been on this medication for very long time. -Will stop HCTZ  -Prescribed cutivate 0.005% cream applied twice a day to affected area when necessary. Avoid axillary, face, inguinal/groin areas. -Will followup with Dr. Meredith Pel in August -Patient was advised to go to the ED if her rash continues to worsen and spread systemically -Photograph of the rash was taken and patient gave verbal consent.

## 2011-06-28 ENCOUNTER — Telehealth: Payer: Self-pay | Admitting: Dietician

## 2011-06-28 ENCOUNTER — Other Ambulatory Visit: Payer: Self-pay | Admitting: *Deleted

## 2011-06-28 DIAGNOSIS — I10 Essential (primary) hypertension: Secondary | ICD-10-CM

## 2011-06-28 NOTE — Telephone Encounter (Signed)
For insurance purpose, can u change qty to 62 instead of 31since it is prescribed 2 tablets.  Thanks

## 2011-06-28 NOTE — Telephone Encounter (Signed)
Called patient about new request for another meter and supplies. Patient gets strips from [facility] and gave permission for [place] to discard of form from [employer]. Would like to check blood sugar twice daily. Advised her to discuss this with Dr. Meredith Pel at next office visit.

## 2011-07-04 MED ORDER — ENALAPRIL MALEATE 20 MG PO TABS: 20.0000 mg | ORAL_TABLET | Freq: Every day | ORAL | Status: DC

## 2011-07-04 NOTE — Telephone Encounter (Signed)
Have changed the strength of the enalapril to the 20 mg tablet so that she still only needs to take 1 tablet per day.

## 2011-07-26 ENCOUNTER — Ambulatory Visit: Payer: Medicare Other | Admitting: Internal Medicine

## 2011-08-09 ENCOUNTER — Encounter: Payer: Self-pay | Admitting: Internal Medicine

## 2011-08-09 ENCOUNTER — Ambulatory Visit (INDEPENDENT_AMBULATORY_CARE_PROVIDER_SITE_OTHER): Payer: Medicare Other | Admitting: Internal Medicine

## 2011-08-09 VITALS — BP 121/79 | HR 80 | Temp 97.6°F | Ht 61.0 in | Wt 217.0 lb

## 2011-08-09 DIAGNOSIS — M4800 Spinal stenosis, site unspecified: Secondary | ICD-10-CM

## 2011-08-09 DIAGNOSIS — Z23 Encounter for immunization: Secondary | ICD-10-CM

## 2011-08-09 DIAGNOSIS — I10 Essential (primary) hypertension: Secondary | ICD-10-CM

## 2011-08-09 DIAGNOSIS — J45909 Unspecified asthma, uncomplicated: Secondary | ICD-10-CM

## 2011-08-09 DIAGNOSIS — E119 Type 2 diabetes mellitus without complications: Secondary | ICD-10-CM

## 2011-08-09 LAB — GLUCOSE, CAPILLARY: Glucose-Capillary: 132 mg/dL — ABNORMAL HIGH (ref 70–99)

## 2011-08-09 MED ORDER — HYDROCHLOROTHIAZIDE 12.5 MG PO CAPS: 12.5000 mg | ORAL_CAPSULE | Freq: Every day | ORAL | Status: DC

## 2011-08-09 MED ORDER — HYDROMORPHONE HCL 2 MG PO TABS: 2.0000 mg | ORAL_TABLET | Freq: Two times a day (BID) | ORAL | Status: DC | PRN

## 2011-08-09 MED ORDER — FLUTICASONE PROPIONATE (INHAL) 50 MCG/BLIST IN AEPB: 1.0000 | INHALATION_SPRAY | Freq: Two times a day (BID) | RESPIRATORY_TRACT | Status: DC

## 2011-08-09 NOTE — Progress Notes (Signed)
  Subjective:    Patient ID: Mary Gray, female    DOB: [birth year], 50 y.o.   MRN: [id number]  HPI Patient presents for followup of her diabetes mellitus, hypertension, spinal stenosis with chronic back pain, and other medical problems.  She reports that she saw her neurosurgeon Dr. Lovell Sheehan last week, and that he plans to do a diagnostic study to help make the decision about whether she would benefit from surgery.  Her blood sugars have been well controlled, with an average value of 120 over the past month.  At the time of her last visit, her HCTZ was discontinued because of concerns that it was causing a rash on her legs; at that time, she was instructed to increase her enalapril dose.  However, she did not increase the enalapril.  She did stop the HCTZ, but reports no improvement in the rash on her legs.  She reports that she is compliant with her medications.  Review of Systems  Cardiovascular: Positive for leg swelling (Patient reports mild edema after stopping HCTZ). Negative for chest pain.  Gastrointestinal: Negative for nausea, vomiting and abdominal pain.      Objective:   Physical Exam  Cardiovascular: Normal rate and regular rhythm.  Exam reveals no gallop and no friction rub.   No murmur heard. Pulmonary/Chest: Breath sounds normal. She has no wheezes. She has no rales.  Musculoskeletal: She exhibits no edema.  Skin: Rash (Patient has scattered small erythematous papules on her legs bilaterally) noted.      Assessment & Plan:

## 2011-08-09 NOTE — Assessment & Plan Note (Signed)
Lab Results  Component Value Date   HGBA1C 5.6 08/09/2011   HGBA1C 6.4 05/31/2011   HGBA1C 5.7 03/01/2011   Lab Results  Component Value Date   MICROALBUR 0.50 04/29/2010   LDLCALC 104* 03/01/2011   CREATININE 0.62 05/31/2011    Assessment: Diabetes control: controlled Progress toward goals: at goal Barriers to meeting goals: no barriers identified  Plan: Diabetes treatment: continue current medications Refer to: none Instruction/counseling given: reminded to get eye exam

## 2011-08-09 NOTE — Assessment & Plan Note (Signed)
Patient is doing well on current regimen.  I refilled her Flovent Diskus today.  Plan is to continue current medications.

## 2011-08-09 NOTE — Assessment & Plan Note (Signed)
  BP Readings from Last 3 Encounters:  08/09/11 121/79  06/14/11 141/78  05/31/11 118/72    Assessment: Hypertension control:  controlled  Progress toward goals:  at goal Barriers to meeting goals:  no barriers identified  Plan: Hypertension treatment:  continue current medications

## 2011-08-09 NOTE — Patient Instructions (Signed)
Restart HCTZ 12.5 mg daily. Continue enalapril 10 mg daily as before. Please see your eye doctor for your eye exam.

## 2011-08-09 NOTE — Assessment & Plan Note (Signed)
Patient has chronic low back pain and reports that she was seen recently by her neurosurgeon, who plans some further imaging before making a decision about whether she might benefit from surgery.  I refilled her hydromorphone today and advised her to use this sparingly for pain that is unrelieved by tramadol.

## 2011-08-14 ENCOUNTER — Other Ambulatory Visit: Payer: Self-pay | Admitting: *Deleted

## 2011-08-14 NOTE — Telephone Encounter (Signed)
Pt using a new provider, [facility], for her test strips.  They will send refill to office.

## 2011-08-15 LAB — POCT PREGNANCY, URINE: Operator id: 294341

## 2011-08-15 LAB — URINE MICROSCOPIC-ADD ON

## 2011-08-15 LAB — URINALYSIS, ROUTINE W REFLEX MICROSCOPIC
Ketones, ur: NEGATIVE
Nitrite: NEGATIVE
Protein, ur: 100 — AB

## 2011-08-17 LAB — URINALYSIS, ROUTINE W REFLEX MICROSCOPIC
Ketones, ur: NEGATIVE
Nitrite: NEGATIVE
Specific Gravity, Urine: 1.006
Urobilinogen, UA: 0.2
pH: 6

## 2011-08-21 LAB — GLUCOSE, CAPILLARY: Glucose-Capillary: 187 — ABNORMAL HIGH

## 2011-08-25 LAB — URINALYSIS, ROUTINE W REFLEX MICROSCOPIC
Bilirubin Urine: NEGATIVE
Glucose, UA: NEGATIVE
Hgb urine dipstick: NEGATIVE
Ketones, ur: NEGATIVE
Nitrite: NEGATIVE
Protein, ur: NEGATIVE
Specific Gravity, Urine: 1.004 — ABNORMAL LOW
Urobilinogen, UA: 0.2
pH: 6.5

## 2011-08-25 LAB — DIFFERENTIAL
Basophils Absolute: 0
Basophils Relative: 1
Eosinophils Relative: 5
Monocytes Absolute: 0.3
Monocytes Relative: 7

## 2011-08-25 LAB — COMPREHENSIVE METABOLIC PANEL
AST: 51 — ABNORMAL HIGH
Albumin: 3.4 — ABNORMAL LOW
Alkaline Phosphatase: 86
Chloride: 97
GFR calc Af Amer: >60
Potassium: 2.9 — ABNORMAL LOW
Sodium: 132 — ABNORMAL LOW
Total Bilirubin: 0.5

## 2011-08-25 LAB — CBC
HCT: 32.8 — ABNORMAL LOW
Platelets: 224
WBC: 4.6

## 2011-08-25 LAB — POCT CARDIAC MARKERS
CKMB, poc: 1.2
Myoglobin, poc: 89
Operator id: 284141
Troponin i, poc: <0.05

## 2011-09-04 ENCOUNTER — Other Ambulatory Visit: Payer: Self-pay | Admitting: *Deleted

## 2011-09-04 DIAGNOSIS — M4800 Spinal stenosis, site unspecified: Secondary | ICD-10-CM

## 2011-09-06 MED ORDER — HYDROMORPHONE HCL 2 MG PO TABS: 2.0000 mg | ORAL_TABLET | Freq: Two times a day (BID) | ORAL | Status: DC | PRN

## 2011-09-06 NOTE — Telephone Encounter (Signed)
Picked up, pt instructed to use sparingly, to try other means of pain relief first, she is agreeable

## 2011-09-06 NOTE — Telephone Encounter (Signed)
Refill prescription printed and signed - nurse to complete.

## 2011-09-12 ENCOUNTER — Other Ambulatory Visit: Payer: Self-pay | Admitting: *Deleted

## 2011-09-13 MED ORDER — CARISOPRODOL 350 MG PO TABS: 350.0000 mg | ORAL_TABLET | Freq: Four times a day (QID) | ORAL | Status: DC | PRN

## 2011-09-13 NOTE — Telephone Encounter (Signed)
Refill approved - nurse to complete. 

## 2011-09-14 NOTE — Telephone Encounter (Signed)
[facility] rx called to [facility].

## 2011-10-04 ENCOUNTER — Other Ambulatory Visit: Payer: Self-pay | Admitting: *Deleted

## 2011-10-04 ENCOUNTER — Other Ambulatory Visit: Payer: Self-pay | Admitting: Internal Medicine

## 2011-10-04 DIAGNOSIS — Z1231 Encounter for screening mammogram for malignant neoplasm of breast: Secondary | ICD-10-CM

## 2011-10-04 DIAGNOSIS — M545 Low back pain: Secondary | ICD-10-CM

## 2011-10-04 DIAGNOSIS — M4800 Spinal stenosis, site unspecified: Secondary | ICD-10-CM

## 2011-10-04 NOTE — Telephone Encounter (Signed)
Next appt 11/01/11.

## 2011-10-05 MED ORDER — HYDROMORPHONE HCL 2 MG PO TABS: 2.0000 mg | ORAL_TABLET | Freq: Two times a day (BID) | ORAL | Status: DC | PRN

## 2011-10-05 MED ORDER — TRAMADOL HCL 50 MG PO TABS: 50.0000 mg | ORAL_TABLET | Freq: Four times a day (QID) | ORAL | Status: DC | PRN

## 2011-10-05 NOTE — Telephone Encounter (Signed)
Pt informed  Sister Joycelyn Rua will pick up rx

## 2011-10-05 NOTE — Telephone Encounter (Signed)
Refill for Dilaudid printed and signed - nurse to complete.

## 2011-10-17 ENCOUNTER — Telehealth: Payer: Self-pay | Admitting: *Deleted

## 2011-10-17 NOTE — Telephone Encounter (Signed)
Agree that she needs to be seen in clinic

## 2011-10-17 NOTE — Telephone Encounter (Signed)
Pt called in with c/o back pain with radiation to rt leg.  Onset pain 5 days ago. Pain not getting better taking aleve, tramadol, & muscle relaxer. She is out of her dilaudid.   She states her pain is usually treated with pain meds.  Onset of Back pain is 15 years ago, with flares like this at different times. Denies numbness of legs.  Pt given appointment for 9:15 tomorrow but advised to go to ED if pain gets worse.

## 2011-10-18 ENCOUNTER — Encounter: Payer: Self-pay | Admitting: Internal Medicine

## 2011-10-18 ENCOUNTER — Encounter: Payer: Medicare Other | Admitting: Internal Medicine

## 2011-10-18 ENCOUNTER — Ambulatory Visit (INDEPENDENT_AMBULATORY_CARE_PROVIDER_SITE_OTHER): Payer: Medicare Other | Admitting: Internal Medicine

## 2011-10-18 DIAGNOSIS — E119 Type 2 diabetes mellitus without complications: Secondary | ICD-10-CM

## 2011-10-18 DIAGNOSIS — I10 Essential (primary) hypertension: Secondary | ICD-10-CM

## 2011-10-18 DIAGNOSIS — M545 Low back pain: Secondary | ICD-10-CM

## 2011-10-18 DIAGNOSIS — M4800 Spinal stenosis, site unspecified: Secondary | ICD-10-CM

## 2011-10-18 DIAGNOSIS — E669 Obesity, unspecified: Secondary | ICD-10-CM

## 2011-10-18 MED ORDER — TRAMADOL HCL 50 MG PO TABS: 50.0000 mg | ORAL_TABLET | Freq: Four times a day (QID) | ORAL | Status: DC | PRN

## 2011-10-18 MED ORDER — HYDROMORPHONE HCL 2 MG PO TABS: 2.0000 mg | ORAL_TABLET | Freq: Two times a day (BID) | ORAL | Status: DC | PRN

## 2011-10-18 NOTE — Patient Instructions (Addendum)
Please use your pain meds sparingly. Rest for next 2 day but do extend your rest for more then 1-2 days as it amy actually worsen your back pain. Please follow up with Dr. Meredith Pel to discuss your treatment options. Please attend your appointment for mammogram.  Back Pain, Adult Low back pain is very common. About 1 in 5 people have back pain.The cause of low back pain is rarely dangerous. The pain often gets better over time.About half of people with a sudden onset of back pain feel better in just 2 weeks. About 8 in 10 people feel better by 6 weeks.  CAUSES Some common causes of back pain include:  Strain of the muscles or ligaments supporting the spine.   Wear and tear (degeneration) of the spinal discs.   Arthritis.   Direct injury to the back.  DIAGNOSIS Most of the time, the direct cause of low back pain is not known.However, back pain can be treated effectively even when the exact cause of the pain is unknown.Answering your caregiver's questions about your overall health and symptoms is one of the most accurate ways to make sure the cause of your pain is not dangerous. If your caregiver needs more information, he or she may order lab work or imaging tests (X-rays or MRIs).However, even if imaging tests show changes in your back, this usually does not require surgery. HOME CARE INSTRUCTIONS For many people, back pain returns.Since low back pain is rarely dangerous, it is often a condition that people can learn to [facility] their own.   Remain active. It is stressful on the back to sit or stand in one place. Do not sit, drive, or stand in one place for more than 30 minutes at a time. Take short walks on level surfaces as soon as pain allows.Try to increase the length of time you walk each day.   Do not stay in bed.Resting more than 1 or 2 days can delay your recovery.   Do not avoid exercise or work.Your body is made to move.It is not dangerous to be active, even though your  back may hurt.Your back will likely heal faster if you return to being active before your pain is gone.   Pay attention to your body when you bend and lift. Many people have less discomfortwhen lifting if they bend their knees, keep the load close to their bodies,and avoid twisting. Often, the most comfortable positions are those that put less stress on your recovering back.   Find a comfortable position to sleep. Use a firm mattress and lie on your side with your knees slightly bent. If you lie on your back, put a pillow under your knees.   Only take over-the-counter or prescription medicines as directed by your caregiver. Over-the-counter medicines to reduce pain and inflammation are often the most helpful.Your caregiver may prescribe muscle relaxant drugs.These medicines help dull your pain so you can more quickly return to your normal activities and healthy exercise.   Put ice on the injured area.   Put ice in a plastic bag.   Place a towel between your skin and the bag.   Leave the ice on for 15 to 20 minutes, 3 to 4 times a day for the first 2 to 3 days. After that, ice and heat may be alternated to reduce pain and spasms.   Ask your caregiver about trying back exercises and gentle massage. This may be of some benefit.   Avoid feeling anxious or stressed.Stress increases muscle  tension and can worsen back pain.It is important to recognize when you are anxious or stressed and learn ways to manage it.Exercise is a great option.  SEEK MEDICAL CARE IF:  You have pain that is not relieved with rest or medicine.   You have pain that does not improve in 1 week.   You have new symptoms.   You are generally not feeling well.  SEEK IMMEDIATE MEDICAL CARE IF:   You have pain that radiates from your back into your legs.   You develop new bowel or bladder control problems.   You have unusual weakness or numbness in your arms or legs.   You develop nausea or vomiting.   You  develop abdominal pain.   You feel faint.  Document Released: 11/06/2005 Document Revised: 07/19/2011 Document Reviewed: 03/27/2011 [facility] Patient Information 2012 [place], [place].

## 2011-10-19 NOTE — Assessment & Plan Note (Signed)
Patient's HbA1c is well controlled and all her CBGs have been in the 100's.

## 2011-10-19 NOTE — Progress Notes (Signed)
  Subjective:    Patient ID: Mary Gray, female    DOB: 1961/04/19, 50 y.o.   MRN: [id number]  HPI Mary Gray is a 50 year old female with past medical history most significant for morbid obesity and multiple other medical conditions as noted in the chart.  She is here today for an acute visit for her back pain. Patient usually sees Dr. Meredith Pel and has an appointment coming up with him in second week of December. Patient has had chronic back pain for many years which is secondary to degenerative disc disease of the lumbar spine and spinal stenosis. She had seen Dr. Lovell Sheehan in the past but is refusing surgery at this time. She states that she is worried about complications of the surgery. She has acute back pain since last 4-5 days which is radiating towards the right leg. Pain is 10 out of 10 in intensity, worse with movement or bending, relieved with rest and pain killers. No fever, chills noted. Patient is able to control her bladder and bowel movements. She does not report tingling numbness weakness or decreased sensation in any of her lower extremities.  Patient wants refill of her pain medications. No other complaints at this time.   Review of Systems  Constitutional: Positive for fatigue. Negative for fever, activity change and appetite change.  HENT: Negative for sore throat.   Respiratory: Negative for cough and shortness of breath.   Cardiovascular: Negative for chest pain and leg swelling.  Gastrointestinal: Negative for nausea, abdominal pain, diarrhea, constipation and abdominal distention.  Genitourinary: Negative for frequency, hematuria and difficulty urinating.  Musculoskeletal: Positive for back pain, arthralgias and gait problem.  Skin: Negative for rash.  Neurological: Negative for dizziness and headaches.  Psychiatric/Behavioral: Negative for suicidal ideas and behavioral problems.       Objective:   Physical Exam  Constitutional: She is oriented to person, place, and  time. She appears well-developed and well-nourished.  HENT:  Head: Normocephalic and atraumatic.  Eyes: Conjunctivae and EOM are normal. Pupils are equal, round, and reactive to light. No scleral icterus.  Neck: Normal range of motion. Neck supple. No JVD present. No thyromegaly present.  Cardiovascular: Normal rate, regular rhythm, normal heart sounds and intact distal pulses.  Exam reveals no gallop and no friction rub.   No murmur heard. Pulmonary/Chest: Effort normal and breath sounds normal. No respiratory distress. She has no wheezes. She has no rales.  Abdominal: Soft. Bowel sounds are normal. She exhibits no distension and no mass. There is no tenderness. There is no rebound and no guarding.  Musculoskeletal: Normal range of motion. She exhibits no edema and no tenderness.       Tenderness noted in the region of lumbar spine with stiff paraspinal muscles.  Lymphadenopathy:    She has no cervical adenopathy.  Neurological: She is alert and oriented to person, place, and time. She exhibits normal muscle tone. Coordination normal.  Psychiatric: She has a normal mood and affect. Her behavior is normal.          Assessment & Plan:

## 2011-10-19 NOTE — Assessment & Plan Note (Signed)
Blood pressure is well-controlled at this time. 

## 2011-10-19 NOTE — Assessment & Plan Note (Signed)
Patient has had this for many years and is being attended by neurosurgery. This acute exacerbation of back pain without red flags does not warrant a repeat MRI as it will not change the management at this time. The recommendations to treat the back pain conservatively with ice packs, pain killers and specific exercises of the back would be followed before proceeding to any imaging studies. I will give the patient 30 tablets of hydromorphone. She last received a refill on November 15 by Dr. Meredith Pel. Patient has an appointment coming up with him next month and I have encouraged her to discuss her options. If she does not want to proceed with surgery, referred to a pain clinic for management of her chronic back pain may not be unreasonable. Patient understands the plan. She was given home instructions to manage back pain. She voiced understanding. Was advised to followup in clinic as needed or else at her regular appointment with Dr. Meredith Pel.

## 2011-10-19 NOTE — Assessment & Plan Note (Signed)
We discussed in detail the various methods to lose weight.

## 2011-11-01 ENCOUNTER — Ambulatory Visit (INDEPENDENT_AMBULATORY_CARE_PROVIDER_SITE_OTHER): Payer: Medicare Other | Admitting: Internal Medicine

## 2011-11-01 ENCOUNTER — Telehealth: Payer: Self-pay | Admitting: *Deleted

## 2011-11-01 ENCOUNTER — Encounter: Payer: Self-pay | Admitting: Internal Medicine

## 2011-11-01 VITALS — BP 121/81 | HR 91 | Temp 97.2°F | Ht 61.0 in

## 2011-11-01 DIAGNOSIS — E119 Type 2 diabetes mellitus without complications: Secondary | ICD-10-CM

## 2011-11-01 DIAGNOSIS — M5430 Sciatica, unspecified side: Secondary | ICD-10-CM

## 2011-11-01 DIAGNOSIS — E039 Hypothyroidism, unspecified: Secondary | ICD-10-CM

## 2011-11-01 DIAGNOSIS — I10 Essential (primary) hypertension: Secondary | ICD-10-CM

## 2011-11-01 DIAGNOSIS — M4800 Spinal stenosis, site unspecified: Secondary | ICD-10-CM

## 2011-11-01 DIAGNOSIS — M5440 Lumbago with sciatica, unspecified side: Secondary | ICD-10-CM

## 2011-11-01 LAB — COMPLETE METABOLIC PANEL WITH GFR
ALT: 15 U/L (ref 0–35)
AST: 17 U/L (ref 0–37)
CO2: 30 mEq/L (ref 19–32)
Calcium: 9.5 mg/dL (ref 8.4–10.5)
Chloride: 95 mEq/L — ABNORMAL LOW (ref 96–112)
Creat: 0.59 mg/dL (ref 0.50–1.10)
Sodium: 135 mEq/L (ref 135–145)
Total Bilirubin: 0.3 mg/dL (ref 0.3–1.2)
Total Protein: 6.4 g/dL (ref 6.0–8.3)

## 2011-11-01 LAB — CBC WITH DIFFERENTIAL/PLATELET
Basophils Relative: 1 % (ref 0–1)
Eosinophils Absolute: 0.4 10*3/uL (ref 0.0–0.7)
Eosinophils Relative: 6 % — ABNORMAL HIGH (ref 0–5)
Hemoglobin: 12.3 g/dL (ref 12.0–15.0)
Lymphs Abs: 1.5 10*3/uL (ref 0.7–4.0)
MCH: 29.3 pg (ref 26.0–34.0)
MCHC: 33.4 g/dL (ref 30.0–36.0)
MCV: 87.6 fL (ref 78.0–100.0)
Monocytes Relative: 5 % (ref 3–12)
RBC: 4.2 MIL/uL (ref 3.87–5.11)

## 2011-11-01 LAB — POCT GLYCOSYLATED HEMOGLOBIN (HGB A1C): Hemoglobin A1C: 6.4

## 2011-11-01 LAB — GLUCOSE, CAPILLARY: Glucose-Capillary: 121 mg/dL — ABNORMAL HIGH (ref 70–99)

## 2011-11-01 MED ORDER — LIDOCAINE 5 % EX PTCH: 1.0000 | MEDICATED_PATCH | Freq: Every day | CUTANEOUS | Status: DC

## 2011-11-01 MED ORDER — CARISOPRODOL 350 MG PO TABS: 350.0000 mg | ORAL_TABLET | Freq: Four times a day (QID) | ORAL | Status: DC | PRN

## 2011-11-01 MED ORDER — HYDROMORPHONE HCL 2 MG PO TABS: 2.0000 mg | ORAL_TABLET | Freq: Four times a day (QID) | ORAL | Status: DC | PRN

## 2011-11-01 NOTE — Telephone Encounter (Signed)
Pt calls c/o script for lidoderm not sent to pharm, after speaking w/ dr Meredith Pel and the pharm, the pt had gone to [facility] today [facility]. i called the pharm and was told that medicare nor medicaid would pay for patches, requires a PA. Spoke w/ dr Meredith Pel again. Patches are not being used for the usual prescribed problem. Called pt and explained this and informed her that medicare/ medicaid would likely refuse to pay, it would take at least 72 hrs, she desires that the PA be tried and i will progress, i have just called the PA ph and was informed that the [employer] system was down and i will have to call back in or more, will continue to work on this and send a note to chart when answer is rec'd also will inform pt.

## 2011-11-01 NOTE — Telephone Encounter (Signed)
The Lidoderm patch is being used to treat low back pain with sciatica; we need to find out if Medicare will cover for this indication.  If not, and if patient cannot afford it, then would advise patient to continue the other pain medications as prescribed.

## 2011-11-01 NOTE — Patient Instructions (Signed)
Please keep appointment for MRI scan as scheduled.

## 2011-11-01 NOTE — Assessment & Plan Note (Signed)
Lab Results  Component Value Date   NA 132* 05/31/2011   K 3.6 05/31/2011   CL 92* 05/31/2011   CO2 29 05/31/2011   BUN 8 05/31/2011   CREATININE 0.62 05/31/2011    BP Readings from Last 3 Encounters:  11/01/11 121/81  10/18/11 115/76  08/09/11 121/79    Assessment: Hypertension control:  controlled  Progress toward goals:  at goal Barriers to meeting goals:  no barriers identified  Plan: Hypertension treatment:  continue current medications

## 2011-11-01 NOTE — Assessment & Plan Note (Signed)
TSH  Date Value Range Status  11/29/2010 1.867  0.350-4.50 (microintl units/mL) Final     Patient is doing well on her current dose of levothyroxine.  Will check a TSH today.

## 2011-11-01 NOTE — Assessment & Plan Note (Signed)
See today's assessment and plan for low back pain with sciatica.

## 2011-11-01 NOTE — Assessment & Plan Note (Signed)
Patient presents with severe low back pain and left sciatica; she has a history of chronic low back pain with episodes of more acute pain, with MRI findings of spinal stenosis and degenerative disc disease of the lumbar spine.  She is followed by neurosurgeon Dr. Lovell Sheehan, who has discussed with her the option of surgery; according to his last note, he will probably pursue a myelogram if she decides in favor of surgery.  Patient is currently reluctant to undergo surgery because she is unsure about the outcome.  Since her pain is severe and has not improved over the past 2 weeks and is accompanied by sciatica and positive left straight leg raise, the plan is to obtain an MRI of the lumbar spine; this was ordered today, to be done on an open scanner, and scheduled for next Wednesday.  For pain management, I will increase her hydromorphone 2 mg to a dose of one tablet every 6 hours if needed for pain, and I will add a Lidoderm patch daily for 12 hours; I advised her to continue her tramadol.  I also advised her to follow up with her neurosurgeon Dr. Lovell Sheehan.

## 2011-11-01 NOTE — Assessment & Plan Note (Signed)
Hemoglobin A1C  Date Value Range Status  11/01/2011 6.4   Final  08/09/2011 5.6   Final  05/31/2011 6.4   Final     Assessment: Diabetes control: controlled Progress toward goals: at goal Barriers to meeting goals: no barriers identified  Plan: Diabetes treatment: continue current medications Refer to: none Instruction/counseling given: reminded to bring medications to each visit

## 2011-11-01 NOTE — Progress Notes (Signed)
  Subjective:    Patient ID: Mary Gray, female    DOB: Oct 27, 1961, 50 y.o.   MRN: [id number]  HPI Patient returns for followup of low back pain which radiates into her left lower extremity down the back of her left buttock, thigh, and leg, and for follow-up of her diabetes mellitus and other chronic medical problems.  This is an exacerbation of her chronic low back pain, for which she was seen on November 28 by Dr. Eben Burow.  She reports no improvement in her pain since then, and she reports that although the hydromorphone provides some relief, the pain returns within a few hours.  She has also been taking tramadol as prescribed.  She reports difficulty walking because of the pain.   Review of Systems  Constitutional: Negative for fever, chills and diaphoresis.  Respiratory: Negative for shortness of breath.   Cardiovascular: Negative for chest pain.  Gastrointestinal: Negative for nausea, vomiting and abdominal pain.       Objective:   Physical Exam  Constitutional: She appears distressed (Moderate distress due to back pain).  Cardiovascular: Normal rate, regular rhythm and normal heart sounds.  Exam reveals no gallop and no friction rub.   No murmur heard. Pulmonary/Chest: Effort normal and breath sounds normal. No respiratory distress. She has no wheezes. She has no rales.  Abdominal: Soft. Bowel sounds are normal. There is no tenderness.  Musculoskeletal: She exhibits no edema.  Neurological:       Distracted left straight leg raise is positive; lower extremities strength appears intact although exam is limited by pain.       Assessment & Plan:

## 2011-11-03 ENCOUNTER — Other Ambulatory Visit: Payer: Self-pay | Admitting: *Deleted

## 2011-11-03 DIAGNOSIS — E119 Type 2 diabetes mellitus without complications: Secondary | ICD-10-CM

## 2011-11-03 MED ORDER — METFORMIN HCL ER 500 MG PO TB24: 500.0000 mg | ORAL_TABLET | Freq: Two times a day (BID) | ORAL | Status: DC

## 2011-11-06 ENCOUNTER — Ambulatory Visit (HOSPITAL_COMMUNITY): Payer: Medicare Other

## 2011-11-08 ENCOUNTER — Ambulatory Visit
Admission: RE | Admit: 2011-11-08 | Discharge: 2011-11-08 | Disposition: A | Discharge status: Home / Self Care | Payer: Medicare Other | Source: Ambulatory Visit | Attending: Internal Medicine | Admitting: Internal Medicine

## 2011-11-08 DIAGNOSIS — M4800 Spinal stenosis, site unspecified: Secondary | ICD-10-CM

## 2011-11-08 DIAGNOSIS — M5440 Lumbago with sciatica, unspecified side: Secondary | ICD-10-CM

## 2011-11-09 ENCOUNTER — Telehealth: Payer: Self-pay | Admitting: Internal Medicine

## 2011-11-09 NOTE — Telephone Encounter (Signed)
I discussed the MRI findings by phone with patient today, and I advised that she follow up with her neurosurgeon Dr. Lovell Sheehan; she agreed to call today and schedule an appointment.  She has a follow up appointment with me on January 9th.

## 2011-11-26 ENCOUNTER — Other Ambulatory Visit: Payer: Self-pay | Admitting: Internal Medicine

## 2011-11-27 ENCOUNTER — Telehealth: Payer: Self-pay | Admitting: *Deleted

## 2011-11-27 ENCOUNTER — Other Ambulatory Visit: Payer: Self-pay | Admitting: Internal Medicine

## 2011-11-27 NOTE — Telephone Encounter (Signed)
Pt calls and requests note for court, states she cannot go because of her health problems and also she has an appt w/ dr Meredith Pel. i have tried to call her for more info and left a message, will attempt again

## 2011-11-28 NOTE — Telephone Encounter (Signed)
Pt called and states her request has been resolved. She does not need note for court/

## 2011-11-29 ENCOUNTER — Encounter: Payer: Self-pay | Admitting: Internal Medicine

## 2011-11-29 ENCOUNTER — Ambulatory Visit (INDEPENDENT_AMBULATORY_CARE_PROVIDER_SITE_OTHER): Payer: Medicare Other | Admitting: Internal Medicine

## 2011-11-29 VITALS — BP 114/71 | HR 92 | Temp 97.6°F

## 2011-11-29 DIAGNOSIS — M5430 Sciatica, unspecified side: Secondary | ICD-10-CM

## 2011-11-29 DIAGNOSIS — M5440 Lumbago with sciatica, unspecified side: Secondary | ICD-10-CM

## 2011-11-29 DIAGNOSIS — M4800 Spinal stenosis, site unspecified: Secondary | ICD-10-CM

## 2011-11-29 DIAGNOSIS — I10 Essential (primary) hypertension: Secondary | ICD-10-CM

## 2011-11-29 DIAGNOSIS — E119 Type 2 diabetes mellitus without complications: Secondary | ICD-10-CM

## 2011-11-29 MED ORDER — HYDROMORPHONE HCL 2 MG PO TABS: 2.0000 mg | ORAL_TABLET | Freq: Four times a day (QID) | ORAL | Status: DC | PRN

## 2011-11-29 NOTE — Assessment & Plan Note (Signed)
MRI of the lumbar spine on 11/09/2011  IMPRESSION: At L2-3, the patient has developed an extruded small disc fragment migrating caudally just to the right of midline.  There is moderate spinal stenosis at this level.  Left lateral disc protrusion at L2- 3 is unchanged.  Grade 1 slip L3-4 with moderate stenosis is unchanged.  Small central disc protrusion L4-5 has improved.  Progression of left foraminal encroachment L5-S1 due to foraminal disc protrusion.  Grade 1 slip L5 on S1 is unchanged.  Original Report Authenticated By: Camelia Phenes, M.D.    Patient has ongoing back pain with left sciatica, and MRI findings as noted above.  I reviewed the findings with her today and provide her a copy of the report.  I again advised her to followup with Dr. Lovell Sheehan, but she is unwilling to do so at this time because of concerns about the risk of surgery.  She plans to return to her chiropractor.  I offered referral to physical therapy, but she declined at this time and cited transportation problems; I advised her to call if she would like a physical therapy referral.  For now, will continue her current dose of hydromorphone.

## 2011-11-29 NOTE — Patient Instructions (Signed)
Please call if you would like a referral to physical therapy.

## 2011-11-29 NOTE — Assessment & Plan Note (Signed)
Lab Results  Component Value Date   NA 135 11/01/2011   K 4.1 11/01/2011   CL 95* 11/01/2011   CO2 30 11/01/2011   BUN 10 11/01/2011   CREATININE 0.59 11/01/2011    BP Readings from Last 3 Encounters:  11/29/11 114/71  11/01/11 121/81  10/18/11 115/76    Assessment: Hypertension control:  controlled  Progress toward goals:  at goal Barriers to meeting goals:  no barriers identified  Plan: Hypertension treatment:  continue current medications

## 2011-11-29 NOTE — Progress Notes (Signed)
  Subjective:    Patient ID: Mary Gray, female    DOB: 06-Nov-1961, 51 y.o.   MRN: [id number]  HPI Patient returns for followup of her chronic low back pain, diabetes mellitus, hypertension, and other chronic problems.  Her main complaint today is ongoing back pain, with incomplete relief from the oral hydromorphone.  As previously noted, when I reviewed the results of her MRI in December I advised that she followup with her neurosurgeon Dr. Lovell Sheehan; she did not do this, because she does not want to undergo surgery at this time.  Instead, she has seen a chiropractor for one visit and plan to followup with him.  She has no new complaints today.  Review of Systems     Objective:   Physical Exam  Cardiovascular: Normal rate and regular rhythm.  Exam reveals no gallop.   Pulmonary/Chest: Effort normal and breath sounds normal. She has no wheezes. She has no rales.  Musculoskeletal: She exhibits no edema.          Assessment & Plan:

## 2011-11-29 NOTE — Assessment & Plan Note (Signed)
Lab Results  Component Value Date   HGBA1C 6.4 11/01/2011   HGBA1C 5.6 08/09/2011     Assessment: Diabetes control: controlled Progress toward goals: at goal Barriers to meeting goals: no barriers identified  Plan: Diabetes treatment: continue current medications Refer to: none Instruction/counseling given: reminded to get eye exam

## 2011-12-11 ENCOUNTER — Ambulatory Visit (HOSPITAL_COMMUNITY): Payer: Medicare Other

## 2011-12-23 ENCOUNTER — Other Ambulatory Visit: Payer: Self-pay | Admitting: Internal Medicine

## 2011-12-25 ENCOUNTER — Ambulatory Visit (HOSPITAL_COMMUNITY)
Admission: RE | Admit: 2011-12-25 | Discharge: 2011-12-25 | Disposition: A | Discharge status: Home / Self Care | Payer: Medicare Other | Source: Ambulatory Visit | Attending: Internal Medicine | Admitting: Internal Medicine

## 2011-12-25 ENCOUNTER — Ambulatory Visit (INDEPENDENT_AMBULATORY_CARE_PROVIDER_SITE_OTHER): Payer: Medicare Other | Admitting: Internal Medicine

## 2011-12-25 ENCOUNTER — Encounter: Payer: Self-pay | Admitting: *Deleted

## 2011-12-25 ENCOUNTER — Encounter: Payer: Self-pay | Admitting: Internal Medicine

## 2011-12-25 DIAGNOSIS — G8929 Other chronic pain: Secondary | ICD-10-CM

## 2011-12-25 DIAGNOSIS — M545 Low back pain, unspecified: Secondary | ICD-10-CM

## 2011-12-25 DIAGNOSIS — I10 Essential (primary) hypertension: Secondary | ICD-10-CM

## 2011-12-25 DIAGNOSIS — R059 Cough, unspecified: Secondary | ICD-10-CM | POA: Insufficient documentation

## 2011-12-25 DIAGNOSIS — J4489 Other specified chronic obstructive pulmonary disease: Secondary | ICD-10-CM | POA: Insufficient documentation

## 2011-12-25 DIAGNOSIS — M1990 Unspecified osteoarthritis, unspecified site: Secondary | ICD-10-CM

## 2011-12-25 DIAGNOSIS — D649 Anemia, unspecified: Secondary | ICD-10-CM

## 2011-12-25 DIAGNOSIS — E119 Type 2 diabetes mellitus without complications: Secondary | ICD-10-CM

## 2011-12-25 DIAGNOSIS — R05 Cough: Secondary | ICD-10-CM

## 2011-12-25 DIAGNOSIS — J069 Acute upper respiratory infection, unspecified: Secondary | ICD-10-CM

## 2011-12-25 DIAGNOSIS — M4800 Spinal stenosis, site unspecified: Secondary | ICD-10-CM

## 2011-12-25 DIAGNOSIS — R079 Chest pain, unspecified: Secondary | ICD-10-CM | POA: Insufficient documentation

## 2011-12-25 DIAGNOSIS — M19079 Primary osteoarthritis, unspecified ankle and foot: Secondary | ICD-10-CM

## 2011-12-25 DIAGNOSIS — J449 Chronic obstructive pulmonary disease, unspecified: Secondary | ICD-10-CM | POA: Insufficient documentation

## 2011-12-25 DIAGNOSIS — M25579 Pain in unspecified ankle and joints of unspecified foot: Secondary | ICD-10-CM

## 2011-12-25 LAB — CBC WITH DIFFERENTIAL/PLATELET
Eosinophils Absolute: 0.3 10*3/uL (ref 0.0–0.7)
Eosinophils Relative: 6 % — ABNORMAL HIGH (ref 0–5)
HCT: 33.9 % — ABNORMAL LOW (ref 36.0–46.0)
Hemoglobin: 12.1 g/dL (ref 12.0–15.0)
Lymphs Abs: 1.4 10*3/uL (ref 0.7–4.0)
MCH: 29.8 pg (ref 26.0–34.0)
MCHC: 35.7 g/dL (ref 30.0–36.0)
MCV: 83.5 fL (ref 78.0–100.0)
Monocytes Absolute: 0.3 10*3/uL (ref 0.1–1.0)
Monocytes Relative: 7 % (ref 3–12)
Neutrophils Relative %: 59 % (ref 43–77)
RBC: 4.06 MIL/uL (ref 3.87–5.11)

## 2011-12-25 MED ORDER — AZITHROMYCIN 250 MG PO TABS: ORAL_TABLET | ORAL | Status: DC

## 2011-12-25 MED ORDER — IBUPROFEN 200 MG PO TABS: 400.0000 mg | ORAL_TABLET | Freq: Three times a day (TID) | ORAL | Status: DC | PRN

## 2011-12-25 MED ORDER — HYDROMORPHONE HCL 2 MG PO TABS: 2.0000 mg | ORAL_TABLET | Freq: Four times a day (QID) | ORAL | Status: DC | PRN

## 2011-12-25 MED ORDER — FERROUS SULFATE 325 (65 FE) MG PO TABS
325.0000 mg | ORAL_TABLET | Freq: Three times a day (TID) | ORAL | Status: DC
Start: 2011-12-25 — End: 2012-01-02

## 2011-12-25 MED ORDER — FERROUS SULFATE 325 (65 FE) MG PO TABS: 325.0000 mg | ORAL_TABLET | Freq: Three times a day (TID) | ORAL | Status: DC

## 2011-12-25 NOTE — Progress Notes (Signed)
Agree with appt. 

## 2011-12-25 NOTE — Patient Instructions (Signed)
1. Please take all medications as prescribed.  2. If you have worsening of your symptoms or new symptoms arise, please call the clinic ([phone]), or go to the ER immediately if symptoms are severe. 

## 2011-12-25 NOTE — Progress Notes (Signed)
Subjective:   Patient ID: Mary Gray female   DOB: [birth year] 51 y.o.   MRN: [id number]  HPI:  [name] is a 51 yo lady with PMH of chronic low back pain, diabetes mellitus, hypertension, and other chronic problems, who presents with several issues.   First, she reports have fever, chill, running nose, cough, aching all over her body for more than a month. She has pleuritic chest pain which is worse with cough and deep breath, but not with exertions. No palpitation. She coughs up some yellow colored sputum without blood in it. She feels like her symptoms are getting worse since it started.  Second, she has ankle swelling and pain over her ankle bilaterally. She thinks that her ankle problem started at similar time with her respiratory symptoms. There is no redness or warmth over her ankles.  Third, she has chronic lower back pain. She was seen recently by Dr. Meredith Pel. Her MRI on 11/09/11 showed: at L2-3, the patient has developed an extruded small disc fragment migrating caudally just to the right of midline. There is moderate spinal stenosis at this level. Left lateral disc protrusion at L2- 3 is unchanged. Grade 1 slip L3-4 with moderate stenosis is unchanged. Small central disc protrusion L4-5 has improved. Progression of left foraminal encroachment L5-S1 due to foraminal disc protrusion. Grade 1 slip L5 on S1 is unchanged. Dr. Meredith Pel advised that she followup with her neurosurgeon Dr. Lovell Sheehan. She made an appointment with her neurosurgeon on 01/02/12.   She also wants to get her pain medication and ferrous sulfate be refilled.   Past Medical History  Diagnosis Date  . Diabetes mellitus   . Hypertension   . Unspecified asthma   . Anxiety state, unspecified   . Hypercholesteremia   . Hypothyroidism   . Obesity   . GERD (gastroesophageal reflux disease)   . Iron deficiency anemia   . DDD (degenerative disc disease), lumbar   . Spinal stenosis   . Left knee pain     Following  fall 11/15/2009.  Marland Kitchen Acne rosacea   . Heme positive stool   . Irregular menses   . Vagina bleeding   . Atrophic vaginitis   . Viral warts, unspecified     Right hand.  . Chronic interstitial cystitis     Followed by Dr. Marcine Matar  . Degeneration of cervical intervertebral disc   . Elevated transaminase level     Mild elevation, AST=48, ALT=51 on 02/01/07.  . Allergic rhinitis   . Hematochezia   . Low back pain   . Carpal tunnel syndrome    Current Outpatient Prescriptions  Medication Sig Dispense Refill  . albuterol (VENTOLIN HFA) 108 (90 BASE) MCG/ACT inhaler Inhale 2 puffs into the lungs every 4 (four) hours as needed.  6.7 g  6  . ALPRAZolam (XANAX) 1 MG tablet Take 1 mg by mouth 4 (four) times daily as needed.        Marland Kitchen aspirin 81 MG chewable tablet Chew 81 mg by mouth daily.        . carisoprodol (SOMA) 350 MG tablet Take 1 tablet (350 mg total) by mouth 4 (four) times daily as needed for muscle spasms.  120 tablet  1  . docusate sodium (COLACE) 100 MG capsule Take 100 mg by mouth 2 (two) times daily.        . enalapril (VASOTEC) 10 MG tablet Take 10 mg by mouth daily.        Marland Kitchen erythromycin  ethylsuccinate (EES) 400 MG tablet Take 400 mg by mouth 2 (two) times daily.        Marland Kitchen esomeprazole (NEXIUM) 40 MG capsule Take 1 capsule (40 mg total) by mouth daily.  31 capsule  6  . ferrous sulfate 325 (65 FE) MG tablet Take 1 tablet (325 mg total) by mouth 3 (three) times daily with meals.  90 tablet  3  . fexofenadine (ALLEGRA) 60 MG tablet Take 60 mg by mouth 2 (two) times daily as needed.        . fluticasone (CUTIVATE) 0.05 % cream Apply to affected area 2 times daily as needed  30 g  0  . fluticasone (FLONASE) 50 MCG/ACT nasal spray Place 2 sprays into the nose daily as needed for rhinitis or allergies.  16 g  2  . fluticasone (FLOVENT DISKUS) 50 MCG/BLIST diskus inhaler Inhale 1 puff into the lungs 2 (two) times daily.  1 Inhaler  11  . glucose blood test strip 1 each by Other  route as needed. Use as instructed       . hydrochlorothiazide (MICROZIDE) 12.5 MG capsule Take 1 capsule (12.5 mg total) by mouth daily.  30 capsule  6  . HYDROmorphone (DILAUDID) 2 MG tablet Take 1 tablet (2 mg total) by mouth every 6 (six) hours as needed for pain.  120 tablet  0  . ipratropium (ATROVENT HFA) 17 MCG/ACT inhaler Inhale 2 puffs into the lungs 4 (four) times daily.  1 Inhaler  2  . metFORMIN (GLUCOPHAGE-XR) 500 MG 24 hr tablet Take 1 tablet (500 mg total) by mouth 2 (two) times daily.  180 tablet  3  . polyethylene glycol (MIRALAX / GLYCOLAX) packet Take 17 g by mouth daily as needed.       Marland Kitchen rOPINIRole (REQUIP) 1 MG tablet Take 1 mg by mouth 3 (three) times daily.        . rosuvastatin (CRESTOR) 10 MG tablet Take 10 mg by mouth daily.        . sertraline (ZOLOFT) 100 MG tablet Take 1 tablet (100 mg total) by mouth daily.  30 tablet  2  . solifenacin (VESICARE) 10 MG tablet Take 10 mg by mouth daily.        Marland Kitchen SYNTHROID 75 MCG tablet Take 1 tablet (75 mcg total) by mouth daily.  30 tablet  11  . traMADol (ULTRAM) 50 MG tablet Take 1 tablet (50 mg total) by mouth every 6 (six) hours as needed for pain.  60 tablet  1  . DISCONTD: ferrous sulfate 325 (65 FE) MG tablet TAKE 1 TABLET (325 MG TOTAL) BY MOUTH 3 (THREE) TIMES DAILY WITH MEALS.  90 tablet  2  . DISCONTD: ferrous sulfate 325 (65 FE) MG tablet Take 1 tablet (325 mg total) by mouth 3 (three) times daily with meals.  90 tablet  3  . azithromycin (ZITHROMAX) 250 MG tablet Take two tablets today, then one tablet for 4 more days, then stop.  6 each  0  . ibuprofen (ADVIL) 200 MG tablet Take 2 tablets (400 mg total) by mouth every 8 (eight) hours as needed for pain.  100 tablet  2  . lidocaine (LIDODERM) 5 % Place 1 patch onto the skin daily. Remove and discard patch within 12 hours.  30 patch  0   Family History  Problem Relation Age of Onset  . Heart attack Father 5  . Breast cancer Neg Hx   . Colon cancer Neg Hx  History    Social History  . Marital Status: Single    Spouse Name: N/A    Number of Children: N/A  . Years of Education: N/A   Social History Main Topics  . Smoking status: Former Smoker    Types: Cigarettes    Quit date: 01/18/1996  . Smokeless tobacco: None  . Alcohol Use: None  . Drug Use: None  . Sexually Active: None   Other Topics Concern  . None   Social History Narrative  . None   Review of Systems:  General: has subjective fevers, chills, no changes in body weight, decreased appetite Skin: no rash HEENT: no blurry vision, hearing changes or sore throat Pulm: dyspnea, coughing, wheezing CV:  chest pain, shortness of breath, no palpitation Abd: no nausea/vomiting, abdominal pain, diarrhea/constipation GU: no dysuria, hematuria, polyuria Ext: has arthralgias, myalgias Neuro: no weakness, numbness, or tingling   Objective:  Physical Exam: Filed Vitals:   12/25/11 1351  BP: 138/86  Pulse: 92  Temp: 98.5 F (36.9 C)  TempSrc: Oral  Height: 5\' 1"  (1.549 m)   General: resting in bed, not in acute distress.  HEENT: PERRL, EOMI, no scleral icterus Cardiac: S1/S2, RRR, No murmurs, gallops or rubs Pulm: Good air movement bilaterally, Clear to auscultation bilaterally, No rales, wheezing, rhonchi or rubs. Abd: Soft,  nondistended, nontender, no rebound pain, no organomegaly, BS present Ext: tender over her ankles bilaterally. Ankles are swelling bilaterally, worse on the left ankle, but no redness and warmth. No rashes 2+DP/PT pulse bilaterally. Tenderness over her lower back at midline.  Neuro: alert and oriented X3, cranial nerves II-XII grossly intact, muscle strength 5/5 in all extremeties,  sensation to light touch intact.    Assessment & Plan:   # URI: her respiratory symptoms are most likely caused by URI. No leukocytosis. CXR has no infiltration, it is unlikely due to PNA. Since her symptoms have been going on for more than a month. The bacterial bronchitis can  not be ruled out. Will treat patient with antibiotics. Since she is allergic to tetracycline, will give 5 days of azithromycin. Patient is instructed to come back if her symptoms get worse.  # Ankle pain: patient has swelling over her ankles, but no redness and warmth. So septic arthritis is unlikely. It is likely due to acute arthritis secondary to viral infection. Will treat patient with Ibuprofen and follow up. Patient is instructed to come back if her pain is getting worse.  # Chronic lower back pain: she is to see her neurosurgeon on 01/02/12. Will treat her symptomatically with pain medications now and follow up.  # HTN: well controlled. Her Bp is 138/86. Will continue current regimen.  # DM-II: well controlled. Her A1c is 6.4 on 11/01/11. Will continue current regimen.  Lorretta Harp

## 2011-12-25 NOTE — Progress Notes (Unsigned)
Pt called c/o severe back pain for a few weeks.   Last clinic visit in January and pt c/o back pain, was asked to see neurosurgeon Dr. Lovell Sheehan.  She has called his office and has not received any return call back.   Also c/o nausea, SOB, body aches and chest pain.  Inhalers not helping. This is on and off for about a month.  She states she is nervous and only rest while sleeping.  Not eating, low cbg's.  Pt does not want to go to ED, she wants to be seen in clinic.  Will see today at 1:30

## 2011-12-26 NOTE — Progress Notes (Signed)
agree

## 2011-12-27 ENCOUNTER — Encounter: Payer: Self-pay | Admitting: Dietician

## 2011-12-30 ENCOUNTER — Emergency Department (HOSPITAL_COMMUNITY)
Admission: EM | Admit: 2011-12-30 | Discharge: 2011-12-30 | Disposition: A | Discharge status: Home / Self Care | Payer: Medicare Other | Attending: Emergency Medicine | Admitting: Emergency Medicine

## 2011-12-30 ENCOUNTER — Encounter (HOSPITAL_COMMUNITY): Payer: Self-pay | Admitting: Emergency Medicine

## 2011-12-30 ENCOUNTER — Other Ambulatory Visit (HOSPITAL_COMMUNITY): Payer: Self-pay | Admitting: Pharmacy Technician

## 2011-12-30 ENCOUNTER — Emergency Department (HOSPITAL_COMMUNITY): Payer: Medicare Other

## 2011-12-30 DIAGNOSIS — R0602 Shortness of breath: Secondary | ICD-10-CM | POA: Insufficient documentation

## 2011-12-30 DIAGNOSIS — R509 Fever, unspecified: Secondary | ICD-10-CM | POA: Insufficient documentation

## 2011-12-30 DIAGNOSIS — J45909 Unspecified asthma, uncomplicated: Secondary | ICD-10-CM | POA: Insufficient documentation

## 2011-12-30 DIAGNOSIS — R05 Cough: Secondary | ICD-10-CM | POA: Insufficient documentation

## 2011-12-30 DIAGNOSIS — R112 Nausea with vomiting, unspecified: Secondary | ICD-10-CM | POA: Insufficient documentation

## 2011-12-30 DIAGNOSIS — F411 Generalized anxiety disorder: Secondary | ICD-10-CM | POA: Insufficient documentation

## 2011-12-30 DIAGNOSIS — K921 Melena: Secondary | ICD-10-CM | POA: Insufficient documentation

## 2011-12-30 DIAGNOSIS — R059 Cough, unspecified: Secondary | ICD-10-CM | POA: Insufficient documentation

## 2011-12-30 DIAGNOSIS — E7800 Pure hypercholesterolemia, unspecified: Secondary | ICD-10-CM | POA: Insufficient documentation

## 2011-12-30 DIAGNOSIS — IMO0001 Reserved for inherently not codable concepts without codable children: Secondary | ICD-10-CM | POA: Insufficient documentation

## 2011-12-30 DIAGNOSIS — J40 Bronchitis, not specified as acute or chronic: Secondary | ICD-10-CM | POA: Insufficient documentation

## 2011-12-30 DIAGNOSIS — I10 Essential (primary) hypertension: Secondary | ICD-10-CM | POA: Insufficient documentation

## 2011-12-30 DIAGNOSIS — E039 Hypothyroidism, unspecified: Secondary | ICD-10-CM | POA: Insufficient documentation

## 2011-12-30 DIAGNOSIS — K219 Gastro-esophageal reflux disease without esophagitis: Secondary | ICD-10-CM | POA: Insufficient documentation

## 2011-12-30 DIAGNOSIS — Z79899 Other long term (current) drug therapy: Secondary | ICD-10-CM | POA: Insufficient documentation

## 2011-12-30 DIAGNOSIS — Z7982 Long term (current) use of aspirin: Secondary | ICD-10-CM | POA: Insufficient documentation

## 2011-12-30 DIAGNOSIS — K625 Hemorrhage of anus and rectum: Secondary | ICD-10-CM

## 2011-12-30 DIAGNOSIS — E119 Type 2 diabetes mellitus without complications: Secondary | ICD-10-CM | POA: Insufficient documentation

## 2011-12-30 DIAGNOSIS — IMO0002 Reserved for concepts with insufficient information to code with codable children: Secondary | ICD-10-CM | POA: Insufficient documentation

## 2011-12-30 LAB — DIFFERENTIAL
Lymphocytes Relative: 28 % (ref 12–46)
Lymphs Abs: 1.6 10*3/uL (ref 0.7–4.0)
Neutro Abs: 3.4 10*3/uL (ref 1.7–7.7)
Neutrophils Relative %: 59 % (ref 43–77)

## 2011-12-30 LAB — COMPREHENSIVE METABOLIC PANEL
ALT: 15 U/L (ref 0–35)
Alkaline Phosphatase: 75 U/L (ref 39–117)
CO2: 29 mEq/L (ref 19–32)
Chloride: 91 mEq/L — ABNORMAL LOW (ref 96–112)
GFR calc Af Amer: >90 mL/min (ref >90)
GFR calc non Af Amer: >90 mL/min (ref >90)
Glucose, Bld: 110 mg/dL — ABNORMAL HIGH (ref 70–99)
Potassium: 4.2 mEq/L (ref 3.5–5.1)
Sodium: 130 mEq/L — ABNORMAL LOW (ref 135–145)
Total Bilirubin: 0.2 mg/dL — ABNORMAL LOW (ref 0.3–1.2)
Total Protein: 6.9 g/dL (ref 6.0–8.3)

## 2011-12-30 LAB — URINALYSIS, ROUTINE W REFLEX MICROSCOPIC
Glucose, UA: NEGATIVE mg/dL
Hgb urine dipstick: NEGATIVE
Ketones, ur: NEGATIVE mg/dL
pH: 7 (ref 5.0–8.0)

## 2011-12-30 LAB — CBC
Hemoglobin: 12.2 g/dL (ref 12.0–15.0)
Platelets: 180 10*3/uL (ref 150–400)
RBC: 4.08 MIL/uL (ref 3.87–5.11)
WBC: 5.8 10*3/uL (ref 4.0–10.5)

## 2011-12-30 MED ORDER — SODIUM CHLORIDE 0.9 % IV SOLN
Freq: Once | INTRAVENOUS | Status: AC
  Administered 2011-12-30: 14:00:00 via INTRAVENOUS

## 2011-12-30 MED ORDER — MOXIFLOXACIN HCL 400 MG PO TABS: 400.0000 mg | ORAL_TABLET | Freq: Every day | ORAL | Status: DC

## 2011-12-30 NOTE — ED Notes (Signed)
Family at bedside. 

## 2011-12-30 NOTE — ED Provider Notes (Signed)
History     CSN: [id number]  Arrival date & time 12/30/11  1231   First MD Initiated Contact with Patient 12/30/11 1350      Chief Complaint  Patient presents with  . Influenza    Persistant body aches, fever, n/v, blood in stool x 2 weeks. tx with Z pack -5 days ago  . Cough    pt reports productive, green/clear secretions  . Rectal Bleeding    Pt reports constipation and blood on tissue when wiping    (Consider location/radiation/quality/duration/timing/severity/associated sxs/prior treatment) Patient is a 51 y.o. female presenting with flu symptoms, cough, and hematochezia. The history is provided by the patient.  Influenza  Cough  Rectal Bleeding  Associated symptoms include coughing.  She has been sick for several months with symptoms waxing and waning. About one week ago, she started having worsening problems with cough and fever which has been as high as 101.7. There have also been associated chills and sweats and nausea and vomiting. Cough is mainly nonproductive. For the last 2 days, she has been having blood in her stools which has been both bright red and black. She is complaining of a generalized myalgias. She saw her PCP 5 days ago who gave her a prescription for Z-Pak which has not helped at all. She is also complaining of dyspnea and a generalized weakness. Nothing makes her symptoms better nothing makes them worse. She rates her symptoms as severe.  Past Medical History  Diagnosis Date  . Diabetes mellitus   . Hypertension   . Unspecified asthma   . Anxiety state, unspecified   . Hypercholesteremia   . Hypothyroidism   . Obesity   . GERD (gastroesophageal reflux disease)   . Iron deficiency anemia   . DDD (degenerative disc disease), lumbar   . Spinal stenosis   . Left knee pain     Following fall 11/15/2009.  Marland Kitchen Acne rosacea   . Heme positive stool   . Irregular menses   . Vagina bleeding   . Atrophic vaginitis   . Viral warts, unspecified     Right  hand.  . Chronic interstitial cystitis     Followed by Dr. Marcine Matar  . Degeneration of cervical intervertebral disc   . Elevated transaminase level     Mild elevation, AST=48, ALT=51 on 02/01/07.  . Allergic rhinitis   . Hematochezia   . Low back pain   . Carpal tunnel syndrome     Past Surgical History  Procedure Date  . Carpal tunnel release 05/08/2000    By Dr. Katy Fitch. Sypher, Montez Hageman.    Family History  Problem Relation Age of Onset  . Heart attack Father 16  . Breast cancer Neg Hx   . Colon cancer Neg Hx   . Diabetes Mother   . Hypertension Mother     History  Substance Use Topics  . Smoking status: Former Smoker    Types: Cigarettes    Quit date: 01/18/1996  . Smokeless tobacco: Not on file  . Alcohol Use: No    OB History    Grav Para Term Preterm Abortions TAB SAB Ect Mult Living                  Review of Systems  Respiratory: Positive for cough.   Gastrointestinal: Positive for hematochezia.  All other systems reviewed and are negative.    Allergies  Codeine sulfate; Meperidine and related; Minocycline hcl; Penicillins; Propoxyphene n-acetaminophen; Tetracycline hcl; and Vicodin  Home Medications   Current Outpatient Rx  Name Route Sig Dispense Refill  . ALBUTEROL SULFATE HFA 108 (90 BASE) MCG/ACT IN AERS Inhalation Inhale 2 puffs into the lungs every 4 (four) hours as needed. 6.7 g 6  . ALPRAZOLAM 1 MG PO TABS Oral Take 1 mg by mouth 4 (four) times daily as needed.      . ASPIRIN 81 MG PO CHEW Oral Chew 81 mg by mouth daily.      Marland Kitchen CARISOPRODOL 350 MG PO TABS Oral Take 1 tablet (350 mg total) by mouth 4 (four) times daily as needed for muscle spasms. 120 tablet 1  . DOCUSATE SODIUM 100 MG PO CAPS Oral Take 100 mg by mouth 2 (two) times daily.      . ENALAPRIL MALEATE 10 MG PO TABS Oral Take 10 mg by mouth daily.      . ERYTHROMYCIN ETHYLSUCCINATE 400 MG PO TABS Oral Take 400 mg by mouth 2 (two) times daily.      Marland Kitchen ESOMEPRAZOLE MAGNESIUM 40  MG PO CPDR Oral Take 1 capsule (40 mg total) by mouth daily. 31 capsule 6  . FERROUS SULFATE 325 (65 FE) MG PO TABS Oral Take 1 tablet (325 mg total) by mouth 3 (three) times daily with meals. 90 tablet 3  . FEXOFENADINE HCL 60 MG PO TABS Oral Take 60 mg by mouth 2 (two) times daily as needed. For allergies    . FLUTICASONE PROPIONATE 0.05 % EX CREA  Apply to affected area 2 times daily as needed 30 g 0  . FLUTICASONE PROPIONATE 50 MCG/ACT NA SUSP Nasal Place 2 sprays into the nose daily as needed for rhinitis or allergies. 16 g 2  . FLUTICASONE PROPIONATE (INHAL) 50 MCG/BLIST IN AEPB Inhalation Inhale 1 puff into the lungs 2 (two) times daily. 1 Inhaler 11  . GLUCOSE BLOOD VI STRP Other 1 each by Other route as needed. Use as instructed     . HYDROCHLOROTHIAZIDE 12.5 MG PO CAPS Oral Take 1 capsule (12.5 mg total) by mouth daily. 30 capsule 6  . HYDROMORPHONE HCL 2 MG PO TABS Oral Take 1 tablet (2 mg total) by mouth every 6 (six) hours as needed for pain. 120 tablet 0  . IBUPROFEN 200 MG PO TABS Oral Take 2 tablets (400 mg total) by mouth every 8 (eight) hours as needed for pain. 100 tablet 2  . IPRATROPIUM BROMIDE HFA 17 MCG/ACT IN AERS Inhalation Inhale 2 puffs into the lungs 4 (four) times daily as needed for wheezing. 1 Inhaler 6  . METFORMIN HCL ER 500 MG PO TB24 Oral Take 1 tablet (500 mg total) by mouth 2 (two) times daily. 180 tablet 3  . POLYETHYLENE GLYCOL 3350 PO PACK Oral Take 17 g by mouth daily as needed. For regularity    . ROPINIROLE HCL 1 MG PO TABS Oral Take 1 mg by mouth 3 (three) times daily.      Marland Kitchen ROSUVASTATIN CALCIUM 10 MG PO TABS Oral Take 10 mg by mouth daily.      . SERTRALINE HCL 100 MG PO TABS Oral Take 1 tablet (100 mg total) by mouth daily. 30 tablet 2  . SOLIFENACIN SUCCINATE 10 MG PO TABS Oral Take 10 mg by mouth daily.      Marland Kitchen SYNTHROID 75 MCG PO TABS Oral Take 1 tablet (75 mcg total) by mouth daily. 30 tablet 11    Dispense as written.  . TRAMADOL HCL 50 MG PO TABS  Oral Take  1 tablet (50 mg total) by mouth every 6 (six) hours as needed for pain. 60 tablet 1  . LIDOCAINE 5 % EX PTCH Transdermal Place 1 patch onto the skin daily. Remove and discard patch within 12 hours. 30 patch 0    BP 132/77  Pulse 83  Temp(Src) 98.5 F (36.9 C) (Oral)  Resp 19  Ht 5' 0.5" (1.537 m)  SpO2 100%  LMP 01/17/2009  Physical Exam  Nursing note and vitals reviewed.  51 year old female who is not in any acute distress. She appears uncomfortable. Vital signs are normal. Oxygen saturation is 100% which is normal. Head is normocephalic and atraumatic. PERRLA, EOMI. Oropharynx is clear. Neck is nontender and supple without adenopathy or JVD. Lungs are clear without rales, wheezes, or rhonchi. Back is nontender. Heart has regular rate and rhythm without murmur. Abdomen is soft, flat, nontender without masses or hepatosplenomegaly and peristalsis is present. Rectal exam shows normal sphincter tone stool is greenish black and Hemoccult positive. Extremities have 1+ edema, no cyanosis. Full range of motion is present. Skin is warm and dry without rash. Neurologic: Mental status is normal, cranial nerves are intact, there no focal motor or sensory deficits.  ED Course  Procedures (including critical care time)  Results for orders placed during the hospital encounter of 12/30/11  URINALYSIS, ROUTINE W REFLEX MICROSCOPIC      Component Value Range   Color, Urine YELLOW  YELLOW    APPearance CLEAR  CLEAR    Specific Gravity, Urine 1.013  1.005 - 1.030    pH 7.0  5.0 - 8.0    Glucose, UA NEGATIVE  NEGATIVE (mg/dL)   Hgb urine dipstick NEGATIVE  NEGATIVE    Bilirubin Urine NEGATIVE  NEGATIVE    Ketones, ur NEGATIVE  NEGATIVE (mg/dL)   Protein, ur NEGATIVE  NEGATIVE (mg/dL)   Urobilinogen, UA 0.2  0.0 - 1.0 (mg/dL)   Nitrite NEGATIVE  NEGATIVE    Leukocytes, UA SMALL (*) NEGATIVE   URINE MICROSCOPIC-ADD ON      Component Value Range   Squamous Epithelial / LPF RARE  RARE     WBC, UA 3-6  <3 (WBC/hpf)  CBC      Component Value Range   WBC 5.8  4.0 - 10.5 (K/uL)   RBC 4.08  3.87 - 5.11 (MIL/uL)   Hemoglobin 12.2  12.0 - 15.0 (g/dL)   HCT 11.9 (*) 14.7 - 46.0 (%)   MCV 83.3  78.0 - 100.0 (fL)   MCH 29.9  26.0 - 34.0 (pg)   MCHC 35.9  30.0 - 36.0 (g/dL)   RDW 82.9  56.2 - 13.0 (%)   Platelets 180  150 - 400 (K/uL)  DIFFERENTIAL      Component Value Range   Neutrophils Relative 59  43 - 77 (%)   Neutro Abs 3.4  1.7 - 7.7 (K/uL)   Lymphocytes Relative 28  12 - 46 (%)   Lymphs Abs 1.6  0.7 - 4.0 (K/uL)   Monocytes Relative 6  3 - 12 (%)   Monocytes Absolute 0.4  0.1 - 1.0 (K/uL)   Eosinophils Relative 6 (*) 0 - 5 (%)   Eosinophils Absolute 0.4  0.0 - 0.7 (K/uL)   Basophils Relative 1  0 - 1 (%)   Basophils Absolute 0.0  0.0 - 0.1 (K/uL)  COMPREHENSIVE METABOLIC PANEL      Component Value Range   Sodium 130 (*) 135 - 145 (mEq/L)   Potassium 4.2  3.5 -  5.1 (mEq/L)   Chloride 91 (*) 96 - 112 (mEq/L)   CO2 29  19 - 32 (mEq/L)   Glucose, Bld 110 (*) 70 - 99 (mg/dL)   BUN 6  6 - 23 (mg/dL)   Creatinine, Ser 1.61  0.50 - 1.10 (mg/dL)   Calcium 9.3  8.4 - 09.6 (mg/dL)   Total Protein 6.9  6.0 - 8.3 (g/dL)   Albumin 4.0  3.5 - 5.2 (g/dL)   AST 16  0 - 37 (U/L)   ALT 15  0 - 35 (U/L)   Alkaline Phosphatase 75  39 - 117 (U/L)   Total Bilirubin 0.2 (*) 0.3 - 1.2 (mg/dL)   GFR calc non Af Amer >90  >90 (mL/min)   GFR calc Af Amer >90  >90 (mL/min)  OCCULT BLOOD, POC DEVICE      Component Value Range   Fecal Occult Bld POSITIVE     Dg Chest 2 View  12/30/2011  *RADIOLOGY REPORT*  Clinical Data: Cough, congestion  CHEST - 2 VIEW  Comparison: 12/25/2011  Findings: Cardiomediastinal silhouette is stable.  No acute infiltrate or pulmonary edema.  Stable central mild bronchitic changes.  Mild degenerative changes lower thoracic spine .  IMPRESSION:  No acute infiltrate or pulmonary edema.  Stable central mild bronchitic changes.  Original Report Authenticated By:  Natasha Mead, M.D.   Dg Chest 2 View  12/25/2011  *RADIOLOGY REPORT*  Clinical Data: Chest pain, cough, congestion, shortness of breath, hypertension, diabetes, COPD, former smoker  CHEST - 2 VIEW  Comparison: 04/21/2011  Findings: Minimal enlargement of cardiac silhouette. Minimal pulmonary vascular congestion. Mediastinal contours stable. Bronchitic changes without infiltrate or effusion. No pneumothorax. Bones appear demineralized. Scattered end plate spur formation thoracic spine.  IMPRESSION: Minimal enlargement of cardiac silhouette. Minimal bronchitic changes.  Original Report Authenticated By: Lollie Marrow, M.D.   Hemoglobin is unchanged from baseline. She shows no evidence of pneumonia on chest x-ray and WBC is normal. There is are no orthostatic pulse or blood pressure changes. She shows no indications for hospitalization at this point. I will try her on a different antibiotic to see if she responds better, and she will need outpatient workup for her rectal bleeding. This is discussed with the patient.   1. Bronchitis   2. Rectal bleed       MDM  Bronchitis, possible pneumonia. Possible GI bleed.        Dione Booze, MD 12/30/11 [phone]

## 2011-12-30 NOTE — ED Notes (Signed)
Per EMS-Pt called EMS to home with c/o persistent flu-like sx

## 2012-01-02 ENCOUNTER — Emergency Department (HOSPITAL_COMMUNITY)
Admission: EM | Admit: 2012-01-02 | Discharge: 2012-01-02 | Disposition: A | Discharge status: Home / Self Care | Payer: Medicare Other | Attending: Emergency Medicine | Admitting: Emergency Medicine

## 2012-01-02 ENCOUNTER — Other Ambulatory Visit: Payer: Self-pay

## 2012-01-02 ENCOUNTER — Emergency Department (HOSPITAL_COMMUNITY): Payer: Medicare Other

## 2012-01-02 ENCOUNTER — Encounter (HOSPITAL_COMMUNITY): Payer: Self-pay

## 2012-01-02 DIAGNOSIS — R05 Cough: Secondary | ICD-10-CM | POA: Insufficient documentation

## 2012-01-02 DIAGNOSIS — R071 Chest pain on breathing: Secondary | ICD-10-CM | POA: Insufficient documentation

## 2012-01-02 DIAGNOSIS — M791 Myalgia, unspecified site: Secondary | ICD-10-CM

## 2012-01-02 DIAGNOSIS — IMO0002 Reserved for concepts with insufficient information to code with codable children: Secondary | ICD-10-CM | POA: Insufficient documentation

## 2012-01-02 DIAGNOSIS — R10819 Abdominal tenderness, unspecified site: Secondary | ICD-10-CM | POA: Insufficient documentation

## 2012-01-02 DIAGNOSIS — R5383 Other fatigue: Secondary | ICD-10-CM | POA: Insufficient documentation

## 2012-01-02 DIAGNOSIS — E119 Type 2 diabetes mellitus without complications: Secondary | ICD-10-CM | POA: Insufficient documentation

## 2012-01-02 DIAGNOSIS — E669 Obesity, unspecified: Secondary | ICD-10-CM | POA: Insufficient documentation

## 2012-01-02 DIAGNOSIS — M79609 Pain in unspecified limb: Secondary | ICD-10-CM | POA: Insufficient documentation

## 2012-01-02 DIAGNOSIS — K219 Gastro-esophageal reflux disease without esophagitis: Secondary | ICD-10-CM | POA: Insufficient documentation

## 2012-01-02 DIAGNOSIS — I10 Essential (primary) hypertension: Secondary | ICD-10-CM | POA: Insufficient documentation

## 2012-01-02 DIAGNOSIS — E871 Hypo-osmolality and hyponatremia: Secondary | ICD-10-CM | POA: Insufficient documentation

## 2012-01-02 DIAGNOSIS — F411 Generalized anxiety disorder: Secondary | ICD-10-CM | POA: Insufficient documentation

## 2012-01-02 DIAGNOSIS — R5381 Other malaise: Secondary | ICD-10-CM | POA: Insufficient documentation

## 2012-01-02 DIAGNOSIS — Z7982 Long term (current) use of aspirin: Secondary | ICD-10-CM | POA: Insufficient documentation

## 2012-01-02 DIAGNOSIS — M549 Dorsalgia, unspecified: Secondary | ICD-10-CM | POA: Insufficient documentation

## 2012-01-02 DIAGNOSIS — Z79899 Other long term (current) drug therapy: Secondary | ICD-10-CM | POA: Insufficient documentation

## 2012-01-02 DIAGNOSIS — R059 Cough, unspecified: Secondary | ICD-10-CM | POA: Insufficient documentation

## 2012-01-02 DIAGNOSIS — E7800 Pure hypercholesterolemia, unspecified: Secondary | ICD-10-CM | POA: Insufficient documentation

## 2012-01-02 DIAGNOSIS — J45909 Unspecified asthma, uncomplicated: Secondary | ICD-10-CM | POA: Insufficient documentation

## 2012-01-02 DIAGNOSIS — IMO0001 Reserved for inherently not codable concepts without codable children: Secondary | ICD-10-CM | POA: Insufficient documentation

## 2012-01-02 LAB — DIFFERENTIAL
Basophils Relative: 1 % (ref 0–1)
Eosinophils Absolute: 0.2 10*3/uL (ref 0.0–0.7)
Monocytes Absolute: 0.3 10*3/uL (ref 0.1–1.0)
Monocytes Relative: 6 % (ref 3–12)
Neutrophils Relative %: 60 % (ref 43–77)

## 2012-01-02 LAB — URINALYSIS, ROUTINE W REFLEX MICROSCOPIC
Bilirubin Urine: NEGATIVE
Ketones, ur: NEGATIVE mg/dL
Nitrite: NEGATIVE
Urobilinogen, UA: 0.2 mg/dL (ref 0.0–1.0)

## 2012-01-02 LAB — URINE MICROSCOPIC-ADD ON

## 2012-01-02 LAB — BASIC METABOLIC PANEL
BUN: 4 mg/dL — ABNORMAL LOW (ref 6–23)
BUN: 4 mg/dL — ABNORMAL LOW (ref 6–23)
Calcium: 9 mg/dL (ref 8.4–10.5)
Creatinine, Ser: 0.51 mg/dL (ref 0.50–1.10)
Creatinine, Ser: 0.49 mg/dL — ABNORMAL LOW (ref 0.50–1.10)
GFR calc Af Amer: >90 mL/min (ref >90)
GFR calc Af Amer: >90 mL/min (ref >90)
GFR calc non Af Amer: >90 mL/min (ref >90)
GFR calc non Af Amer: >90 mL/min (ref >90)
Potassium: 4.1 mEq/L (ref 3.5–5.1)

## 2012-01-02 LAB — CBC
Hemoglobin: 11.8 g/dL — ABNORMAL LOW (ref 12.0–15.0)
MCH: 28.4 pg (ref 26.0–34.0)
MCHC: 34.5 g/dL (ref 30.0–36.0)
RDW: 12.3 % (ref 11.5–15.5)

## 2012-01-02 LAB — CK: Total CK: 135 U/L (ref 7–177)

## 2012-01-02 LAB — POCT I-STAT TROPONIN I: Troponin i, poc: 0 ng/mL (ref 0.00–0.08)

## 2012-01-02 MED ORDER — CLONAZEPAM 0.5 MG PO TABS: 1.0000 mg | ORAL_TABLET | Freq: Three times a day (TID) | ORAL | Status: DC | PRN

## 2012-01-02 MED ORDER — ZOLPIDEM TARTRATE 10 MG PO TABS: 10.0000 mg | ORAL_TABLET | Freq: Every evening | ORAL | Status: DC | PRN

## 2012-01-02 MED ORDER — ALBUTEROL SULFATE (5 MG/ML) 0.5% IN NEBU
5.0000 mg | INHALATION_SOLUTION | Freq: Once | RESPIRATORY_TRACT | Status: AC
  Administered 2012-01-02: 5 mg via RESPIRATORY_TRACT
  Filled 2012-01-02: qty 1

## 2012-01-02 MED ORDER — HYDROMORPHONE HCL PF 1 MG/ML IJ SOLN
1.0000 mg | Freq: Once | INTRAMUSCULAR | Status: AC
  Administered 2012-01-02: 1 mg via INTRAVENOUS
  Filled 2012-01-02: qty 1

## 2012-01-02 MED ORDER — ONDANSETRON HCL 4 MG PO TABS: 4.0000 mg | ORAL_TABLET | Freq: Four times a day (QID) | ORAL | Status: AC

## 2012-01-02 MED ORDER — LORAZEPAM 2 MG/ML IJ SOLN
1.0000 mg | Freq: Once | INTRAMUSCULAR | Status: AC
  Administered 2012-01-02: 1 mg via INTRAVENOUS
  Filled 2012-01-02: qty 1

## 2012-01-02 MED ORDER — IPRATROPIUM BROMIDE 0.02 % IN SOLN
0.5000 mg | Freq: Once | RESPIRATORY_TRACT | Status: AC
  Administered 2012-01-02: 0.5 mg via RESPIRATORY_TRACT
  Filled 2012-01-02: qty 2.5

## 2012-01-02 MED ORDER — SODIUM CHLORIDE 0.9 % IV BOLUS (SEPSIS)
1000.0000 mL | Freq: Once | INTRAVENOUS | Status: AC
  Administered 2012-01-02: 1000 mL via INTRAVENOUS

## 2012-01-02 MED ORDER — ONDANSETRON HCL 4 MG/2ML IJ SOLN
4.0000 mg | Freq: Once | INTRAMUSCULAR | Status: AC
  Administered 2012-01-02: 4 mg via INTRAVENOUS
  Filled 2012-01-02: qty 2

## 2012-01-02 MED ORDER — FENTANYL CITRATE 0.05 MG/ML IJ SOLN
50.0000 ug | Freq: Once | INTRAMUSCULAR | Status: AC
  Administered 2012-01-02: 50 ug via INTRAVENOUS
  Filled 2012-01-02: qty 2

## 2012-01-02 NOTE — Consults (Signed)
Date: 01/02/2012            Patient Name:  Mary Gray  MRN: [id number]  DOB: 1961/08/23  Age / Sex: 51 y.o., female   PCP: Mary Ly, MD, MD           Medical Service: Internal Medicine Teaching Service           Attending Physician: Dr. Nat Christen, MD      Chief Complaint: "All over pain and not feeling well"  History of Present Illness: Patient is a 51 y.o. female with a PMHx of DM, HTN, anxiety, hypothyroidism and GERD being seen at the request of Mary Gray for evaluation of hyponatremia.  Mary Gray presents to [facility] hospital with complaints of chest tightness, abdominal pain, constipation, nausea, myalgias, runny nose and chills lasting longer than one month. She had previously been evaulated by Mary Gray in the Internal Medicine clinic on 12/25/11 and was treated with a 5 day course of azithromycin for possible bacterial bronchitis, although her chest xray was clear. She presented to the [facility] ED on 12/30/11 with similar complaints as well as complaints of blood in her stool. A CXR was repeated which demonstrated no infiltrate or interval change, and she was without fever or leukocytosis. Her antibiotic was changed to moxifloxacin and outpatient follow-up for rectal bleeding.  Today she returns complaining of the same symptoms. A CXR was repeated today and demonstrated no infiltrate or change. She says that nothing makes her symptoms better or worse. She says she felt some chest tightness but felt better after receiving albuterol nebs in the ED. She has a dry cough that is worse with recumbency. She also notes that she has not been using her inhaler at home because she gets "nervous." She describes straining with defecation and hard stools. She also notes increased nausea, especially when she feels stressed or panicked.  She does note that she has been sleeping poorly over the past 10 days, often 2 hours per night due to racing thoughts. Her sister mentions that she has trouble falling  asleep sometimes because she feels like she might die.  Mary Gray describes feeling constantly "nervous" and "fearful." She describes feeling panic at times, and not being able to control her emotions. Per her sister she has been more tearful than usual for several months. She takes Xanax 1mg  four times a day, and notes that this makes her feel better for a short period of time. She denies SI and HI.  She also states that she occasionally has feels of panic with racing heart beats sweating, and nausea at random times including when she is not thinking about anything specific.  She states she has been on the Xanax for sometime but has not been on medications such as Prozac, Zoloft, Celexa, or Lexapro before.    Past Medical History: Past Medical History  Diagnosis Date  . Diabetes mellitus   . Hypertension   . Unspecified asthma   . Anxiety state, unspecified   . Hypercholesteremia   . Hypothyroidism   . Obesity   . GERD (gastroesophageal reflux disease)   . Iron deficiency anemia   . DDD (degenerative disc disease), lumbar   . Spinal stenosis   . Left knee pain     Following fall 11/15/2009.  Marland Kitchen Acne rosacea   . Heme positive stool   . Irregular menses   . Vagina bleeding   . Atrophic vaginitis   . Viral warts, unspecified  Right hand.  . Chronic interstitial cystitis     Followed by Mary Gray  . Degeneration of cervical intervertebral disc   . Elevated transaminase level     Mild elevation, AST=48, ALT=51 on 02/01/07.  . Allergic rhinitis   . Hematochezia   . Low back pain   . Carpal tunnel syndrome    Past Surgical History: Past Surgical History  Procedure Date  . Carpal tunnel release 05/08/2000    By Mary Gray, Mary Gray.   Family History: Family History  Problem Relation Age of Onset  . Heart attack Father 72  . Breast cancer Neg Hx   . Colon cancer Neg Hx   . Diabetes Mother   . Hypertension Mother    Social History: History   Social  History  . Marital Status: Single    Spouse Name: N/A    Number of Children: N/A  . Years of Education: N/A   Occupational History  . Not on file.   Social History Main Topics  . Smoking status: Former Smoker    Types: Cigarettes    Quit date: 01/18/1996  . Smokeless tobacco: Not on file  . Alcohol Use: No  . Drug Use: Not on file  . Sexually Active: Not on file   Other Topics Concern  . Not on file   Social History Narrative  . No narrative on file   Current Outpatient Medications: Medication List  As of 01/02/2012  2:59 PM   ASK your doctor about these medications         albuterol 108 (90 BASE) MCG/ACT inhaler   Commonly known as: PROVENTIL HFA;VENTOLIN HFA   Inhale 2 puffs into the lungs every 4 (four) hours as needed. For shortness of breath      ALPRAZolam 1 MG tablet   Commonly known as: XANAX   Take 1 mg by mouth 4 (four) times daily as needed. For anxiety      aspirin 81 MG chewable tablet   Chew 81 mg by mouth daily.      carisoprodol 350 MG tablet   Commonly known as: SOMA   Take 350 mg by mouth 4 (four) times daily as needed.      COLACE 100 MG capsule   Generic drug: docusate sodium   Take 100 mg by mouth 2 (two) times daily.      enalapril 10 MG tablet   Commonly known as: VASOTEC   Take 10 mg by mouth daily.      esomeprazole 40 MG capsule   Commonly known as: NEXIUM   Take 40 mg by mouth daily.      ferrous sulfate 325 (65 FE) MG tablet   Take 325 mg by mouth 3 (three) times daily with meals.      fluticasone 0.05 % cream   Commonly known as: CUTIVATE   Apply 1 application topically daily. For rash/Apply to affected area 2 times daily as needed      fluticasone 50 MCG/ACT nasal spray   Commonly known as: FLONASE   Place 2 sprays into the nose daily as needed. For allergies      fluticasone 50 MCG/BLIST diskus inhaler   Commonly known as: FLOVENT DISKUS   Inhale 1 puff into the lungs 2 (two) times daily.      glucose blood test strip    1 each by Other route as needed. Use as instructed      hydrochlorothiazide 12.5 MG capsule   Commonly  known as: MICROZIDE   Take 12.5 mg by mouth daily.      HYDROmorphone 2 MG tablet   Commonly known as: DILAUDID   Take 2 mg by mouth every 6 (six) hours as needed. For pain      ipratropium 17 MCG/ACT inhaler   Commonly known as: ATROVENT HFA   Inhale 2 puffs into the lungs 4 (four) times daily as needed. For wheezing      metFORMIN 500 MG 24 hr tablet   Commonly known as: GLUCOPHAGE-XR   Take 500 mg by mouth 2 (two) times daily.      moxifloxacin 400 MG tablet   Commonly known as: AVELOX   Take 400 mg by mouth daily.      polyethylene glycol packet   Commonly known as: MIRALAX / GLYCOLAX   Take 17 g by mouth daily as needed. For regularity      rOPINIRole 1 MG tablet   Commonly known as: REQUIP   Take 1 mg by mouth 3 (three) times daily.      rosuvastatin 10 MG tablet   Commonly known as: CRESTOR   Take 10 mg by mouth daily.      solifenacin 10 MG tablet   Commonly known as: VESICARE   Take 10 mg by mouth daily.      SYNTHROID 75 MCG tablet   Generic drug: levothyroxine   Take 75 mcg by mouth daily.      traMADol 50 MG tablet   Commonly known as: ULTRAM   Take 1 tablet (50 mg total) by mouth every 6 (six) hours as needed for pain.           Allergies: Allergies  Allergen Reactions  . Codeine Sulfate Other (See Comments)    Confusion, anxiety, fatigue  . Meperidine And Related Nausea And Vomiting  . Minocycline Hcl Nausea And Vomiting    Hospitalized for vomiting along with allergic reaction  . Penicillins Other (See Comments)    unknown  . Propoxyphene N-Acetaminophen Other (See Comments)    confused  . Tetracycline Hcl Other (See Comments)    vomiting  . Vicodin (Hydrocodone-Acetaminophen) Other (See Comments)    confusion    Vital Signs: Blood pressure 124/67, pulse 95, temperature 98.4 F (36.9 C), temperature source Oral, resp. rate 20,  height 5\' 1"  (1.549 m), weight 99.791 kg (220 lb), last menstrual period 01/17/2009, SpO2 95.00%.  Physical Exam: General: resting in bed HEENT: PERRL, EOMI, no scleral icterus Cardiac: RRR, no rubs, murmurs or gallops Pulm: clear to auscultation bilaterally, moving normal volumes of air Abd: soft, nontender, nondistended, BS present Ext: warm and well perfused, no pedal edema Neuro: alert and oriented X3, cranial nerves II-XII grossly intact  Lab results: Basic Metabolic Panel:  Basename 01/02/12 1025  NA 123*  K 4.1  CL 85*  CO2 25  GLUCOSE 139*  BUN 4*  CREATININE 0.51  CALCIUM 9.5  MG --  PHOS --    Basename 01/02/12 1025  LIPASE 24  AMYLASE --   CBC:  Basename 01/02/12 1025  WBC 4.9  NEUTROABS 3.0  HGB 11.8*  HCT 34.2*  MCV 82.4  PLT 193   Cardiac Enzymes:  Basename 01/02/12 1025  CKTOTAL 135  CKMB --  CKMBINDEX --  TROPONINI --   CBG:  Basename 01/02/12 1120  GLUCAP 127*   Urine Drug Screen: Drugs of Abuse     Component Value Date/Time   LABOPIA NEG 04/29/2010 0155   LABOPIA NEGATIVE 12/12/2008  0433   COCAINSCRNUR NEG 04/29/2010 0155   LABBENZ POS* 04/29/2010 0155   AMPHETMU NEG 04/29/2010 0155    Urinalysis:  Basename 01/02/12 1037  COLORURINE YELLOW  LABSPEC 1.006  PHURINE 7.5  GLUCOSEU NEGATIVE  HGBUR NEGATIVE  BILIRUBINUR NEGATIVE  KETONESUR NEGATIVE  PROTEINUR NEGATIVE  UROBILINOGEN 0.2  NITRITE NEGATIVE  LEUKOCYTESUR TRACE*   Imaging results:  Dg Chest 2 View 01/02/2012  IMPRESSION: No acute cardiopulmonary abnormality.     Other results:  EKG (01/02/2012) Normal Sinus Rhythm  Assessment & Plan: Pt is a 51 y.o. yo female with a PMHx of DM, HTN, anxiety, hypothyroidism and GERD presenting with complaints of chest tightness, abdominal pain, constipation, nausea, myalgias, runny nose and chills. Her metabolic panel also demonstrates a sodium of 123.  1) Hyponatremia Ms. Tift has a history of chronic hyponatremia with serum  sodium ~130 at her past few clinic visits. She is clinically euvolemic on exam. We suspect that her hyponatremia is likely 2/2 increased free water intake or could be related to her hypothyroidism. Her symptoms on presentation are chronic, and do not seem consistent with symptomatic hypernatremia. She received 1L of NS in the ED. - Recommmend additional liter of NS in the ED. - Recheck sodium. If increases to at least 128, would feel comfortable discharging her. - Has follow-up scheduled for Internal Med clinic this Friday, 1/15 at 9:15am to follow-up on hyponatremia.  2) Anxiety We believe Ms. Wenke's physical complaints are likely closely related to her anxiety disorder, as her clinical exam, imaging, and most of her laboratory studies are benign. She also endorses worsening of symptoms when she feels stressed or sleep deprived. She is not sleeping at night and requires xanax 1mg  QID for her symptoms.  - Recommend longer acting BZP for anxiety, Klonipin 1mg  BID with 1mg  prn qd to replace xanax. - Recommend Ambien 5 mg qhs x3 days until appt w PCP to reevaluate insomnia  - Recommend additional 1mg  of Ativan in ED as she is particularly anxious now.   3) Chest tightness Has improved with albuterol nebs, pt says she has not been taking asthma medications at home. No wheezing on exam and good movement of air. EKG no evidence of ischemia. Suspect that this is also related to her anxiety. - Continue asthma home meds on discharge from ED.  4) Rectal Bleeding Pt does not complain of rectal bleeding bothering her today. Her Hb is stable.  - Recommend she follow-up with this issue at her outpatient follow-up on Friday.   Bronson Curb, MS4  Kiril Hippe 01/02/2012 2:49 PM

## 2012-01-02 NOTE — ED Provider Notes (Signed)
Medical screening examination/treatment/procedure(s) were conducted as a shared visit with non-physician practitioner(s) and myself.  I personally evaluated the patient during the encounter  Patient presents with a multitude of symptoms including myalgia, cough, subjective fevers, chest pain and nausea.  Patient has been seen by her primary care physician in the outpatient clinic and given azithromycin for this without relief of her symptoms.  She's also been seen in the [facility] long emergency department recently as well.  Patient does have clear chest x-rays on review.  Patient's vital signs are normal at this time but patient did have some feelings of tightness in her chest so a nebulizer treatment was given.  On reevaluation of her lungs she has no wheezing present.  Her concern today is that her sodium is 123 which is a decrease from her prior emergency department visit the last week.  I discussed this with the resident from the outpatient clinics who will come and evaluate the patient for possible admission given her lower sodium which will require repletion over a prolonged period of time.  Nat Christen, MD 01/02/12 1246

## 2012-01-02 NOTE — ED Notes (Signed)
Chest pain and sob, pt moaning and groaning in triage, pt was seen at [facility] long for same and given abx sts no relief and she is scared

## 2012-01-02 NOTE — ED Notes (Signed)
Pt in gown, on monitor, continuous pulse oximetry and blood pressure cuff 

## 2012-01-02 NOTE — ED Provider Notes (Signed)
Medical screening examination/treatment/procedure(s) were conducted as a shared visit with non-physician practitioner(s) and myself.  I personally evaluated the patient during the encounter   Kalis Friese A Adley Mazurowski, MD 01/02/12 1716 

## 2012-01-02 NOTE — ED Provider Notes (Signed)
History     CSN: [id number]  Arrival date & time 01/02/12  9147   First MD Initiated Contact with Patient 01/02/12 [phone]      Chief Complaint  Patient presents with  . Chest Pain    (Consider location/radiation/quality/duration/timing/severity/associated sxs/prior treatment) HPI  Who is obese, has non-insulin-dependent diabetes, hypertension, asthma, degenerative disc disease, chronic back pain, presents to emergency department complaining of a multiple week history of total body aches, chest pain, chest tightness, coughing, muscle aches, and overall not feeling well. Patient states that she feels mildly short of breath. Patient states she saw her primary care physician last week for the same symptoms and was prescribed an antibiotic and did not improve and therefore went to the emergency department 4 days ago and having a lot of change but states she still has ongoing total body aches stating "my chest feels tight and I just hurt all over" she denies fevers, chills, HA, dizziness, sore throat, neck stiffness, hemoptysis, abdominal pain, vomiting, diarrhea, dysuria, hematuria or blood in her stool. Denies aggravating or alleviating factors.   Past Medical History  Diagnosis Date  . Diabetes mellitus   . Hypertension   . Unspecified asthma   . Anxiety state, unspecified   . Hypercholesteremia   . Hypothyroidism   . Obesity   . GERD (gastroesophageal reflux disease)   . Iron deficiency anemia   . DDD (degenerative disc disease), lumbar   . Spinal stenosis   . Left knee pain     Following fall 11/15/2009.  Marland Kitchen Acne rosacea   . Heme positive stool   . Irregular menses   . Vagina bleeding   . Atrophic vaginitis   . Viral warts, unspecified     Right hand.  . Chronic interstitial cystitis     Followed by Dr. Marcine Matar  . Degeneration of cervical intervertebral disc   . Elevated transaminase level     Mild elevation, AST=48, ALT=51 on 02/01/07.  . Allergic rhinitis   .  Hematochezia   . Low back pain   . Carpal tunnel syndrome     Past Surgical History  Procedure Date  . Carpal tunnel release 05/08/2000    By Dr. Katy Fitch. Sypher, Montez Hageman.    Family History  Problem Relation Age of Onset  . Heart attack Father 30  . Breast cancer Neg Hx   . Colon cancer Neg Hx   . Diabetes Mother   . Hypertension Mother     History  Substance Use Topics  . Smoking status: Former Smoker    Types: Cigarettes    Quit date: 01/18/1996  . Smokeless tobacco: Not on file  . Alcohol Use: No    OB History    Grav Para Term Preterm Abortions TAB SAB Ect Mult Living                  Review of Systems  All other systems reviewed and are negative.    Allergies  Codeine sulfate; Meperidine and related; Minocycline hcl; Penicillins; Propoxyphene n-acetaminophen; Tetracycline hcl; and Vicodin  Home Medications   Current Outpatient Rx  Name Route Sig Dispense Refill  . ALBUTEROL SULFATE HFA 108 (90 BASE) MCG/ACT IN AERS Inhalation Inhale 2 puffs into the lungs every 4 (four) hours as needed. For shortness of breath    . ALPRAZOLAM 1 MG PO TABS Oral Take 1 mg by mouth 4 (four) times daily as needed. For anxiety    . ASPIRIN 81 MG PO CHEW Oral  Chew 81 mg by mouth daily.     Marland Kitchen CARISOPRODOL 350 MG PO TABS Oral Take 350 mg by mouth 4 (four) times daily as needed.    Marland Kitchen DOCUSATE SODIUM 100 MG PO CAPS Oral Take 100 mg by mouth 2 (two) times daily.     . ENALAPRIL MALEATE 10 MG PO TABS Oral Take 10 mg by mouth daily.     Marland Kitchen ESOMEPRAZOLE MAGNESIUM 40 MG PO CPDR Oral Take 40 mg by mouth daily.    Marland Kitchen FERROUS SULFATE 325 (65 FE) MG PO TABS Oral Take 325 mg by mouth 3 (three) times daily with meals.    Marland Kitchen FLUTICASONE PROPIONATE 0.05 % EX CREA Topical Apply 1 application topically daily. For rash/Apply to affected area 2 times daily as needed    . FLUTICASONE PROPIONATE (INHAL) 50 MCG/BLIST IN AEPB Inhalation Inhale 1 puff into the lungs 2 (two) times daily.    Marland Kitchen HYDROCHLOROTHIAZIDE  12.5 MG PO CAPS Oral Take 12.5 mg by mouth daily.    Marland Kitchen HYDROMORPHONE HCL 2 MG PO TABS Oral Take 2 mg by mouth every 6 (six) hours as needed. For pain    . IPRATROPIUM BROMIDE HFA 17 MCG/ACT IN AERS Inhalation Inhale 2 puffs into the lungs 4 (four) times daily as needed. For wheezing    . LEVOTHYROXINE SODIUM 75 MCG PO TABS Oral Take 75 mcg by mouth daily.    Marland Kitchen METFORMIN HCL ER 500 MG PO TB24 Oral Take 500 mg by mouth 2 (two) times daily.    Marland Kitchen MOXIFLOXACIN HCL 400 MG PO TABS Oral Take 400 mg by mouth daily.    Marland Kitchen POLYETHYLENE GLYCOL 3350 PO PACK Oral Take 17 g by mouth daily as needed. For regularity    . ROPINIROLE HCL 1 MG PO TABS Oral Take 1 mg by mouth 3 (three) times daily.      Marland Kitchen ROSUVASTATIN CALCIUM 10 MG PO TABS Oral Take 10 mg by mouth daily.     Marland Kitchen SOLIFENACIN SUCCINATE 10 MG PO TABS Oral Take 10 mg by mouth daily.      . TRAMADOL HCL 50 MG PO TABS Oral Take 1 tablet (50 mg total) by mouth every 6 (six) hours as needed for pain. 60 tablet 1  . FLUTICASONE PROPIONATE 50 MCG/ACT NA SUSP Nasal Place 2 sprays into the nose daily as needed. For allergies    . GLUCOSE BLOOD VI STRP Other 1 each by Other route as needed. Use as instructed       BP 139/83  Pulse 85  Temp(Src) 98.2 F (36.8 C) (Oral)  Resp 16  Ht 5\' 1"  (1.549 m)  Wt 220 lb (99.791 kg)  BMI 41.57 kg/m2  SpO2 97%  LMP 01/17/2009  Physical Exam  Nursing note and vitals reviewed. Constitutional: She is oriented to person, place, and time. She appears well-developed and well-nourished. No distress.       Obese, uncomfortable appearing  HENT:  Head: Normocephalic and atraumatic.  Eyes: Conjunctivae are normal.  Neck: Normal range of motion. Neck supple.  Cardiovascular: Normal rate, regular rhythm, normal heart sounds and intact distal pulses.  Exam reveals no gallop and no friction rub.   No murmur heard. Pulmonary/Chest: Effort normal and breath sounds normal. No respiratory distress. She has no wheezes. She has no  rales. She exhibits tenderness.       Mild diffuse TTP to entire chest wall. No skin changes or crepitous.   Abdominal: Soft. Bowel sounds are normal. She exhibits  no distension and no mass. There is tenderness. There is no rebound and no guarding.       Mild diffuse TTP of entire abdomen to light touch  Musculoskeletal: Normal range of motion. She exhibits tenderness. She exhibits no edema.       Diffuse TTP of entire UE and LE as well as back to light touch, however no skin changes, erythema or edema.   Neurological: She is alert and oriented to person, place, and time. She has normal reflexes.  Skin: Skin is warm and dry. No rash noted. She is not diaphoretic. No erythema.  Psychiatric: She has a normal mood and affect.    ED Course  Procedures (including critical care time)  IV fentanyl, IV ativan, IV zofran.  Neb albuterol/atroven   Date: 01/02/2012  Rate: 85  Rhythm: normal sinus rhythm  QRS Axis: normal  Intervals: normal  ST/T Wave abnormalities: nonspecific T wave changes  Conduction Disutrbances:none  Narrative Interpretation:   Old EKG Reviewed: non provocative EKG compared to Apr 20, 2011    Labs Reviewed  CBC - Abnormal; Notable for the following:    Hemoglobin 11.8 (*)    HCT 34.2 (*)    All other components within normal limits  URINALYSIS, ROUTINE W REFLEX MICROSCOPIC - Abnormal; Notable for the following:    Leukocytes, UA TRACE (*)    All other components within normal limits  GLUCOSE, CAPILLARY - Abnormal; Notable for the following:    Glucose-Capillary 127 (*)    All other components within normal limits  DIFFERENTIAL  POCT I-STAT TROPONIN I  URINE MICROSCOPIC-ADD ON  BASIC METABOLIC PANEL  LIPASE, BLOOD  CK   Dg Chest 2 View  01/02/2012  *RADIOLOGY REPORT*  Clinical Data: 51 year old female with cough, right chest pain, weakness, headache, back pain  CHEST - 2 VIEW  Comparison: 12/30/2011 and earlier.  Findings: Stable lung volumes.  Cardiac size  and mediastinal contours are within normal limits.  Visualized tracheal air column is within normal limits.  No pneumothorax, pulmonary edema, pleural effusion or confluent pulmonary opacity. No acute osseous abnormality identified.  IMPRESSION: No acute cardiopulmonary abnormality.  Original Report Authenticated By: Harley Hallmark, M.D.     1. Hyponatremia   2. Myalgia   3. Cough   4. Chest pain       MDM  Dr. Golda Acre to speak with [facility] for admission for hyponatremia, weakness, myalgias and CP. VSS.         Jenness Corner, PA 01/02/12 1234  Jenness Corner, PA 01/02/12 1245  [facility] has seen patient in ER and believe that there is a degree of "somatization " to patient's complaints and that they desire hyponatremia to be addressed in ER with another bolus of IV fluids, 1 liter and have the patient eat and drink then recheck sodium. They have scheduled her a follow up appointment in office this Friday at 9:15. Patient states her overall pain is improving. On recheck of sodium, if improved then [facility] requests patient be sent home with Ambien and Clonopin (TID, PRN).   Jenness Corner, [place] 01/02/12 1606

## 2012-01-02 NOTE — ED Notes (Signed)
Patient transported to X-ray 

## 2012-01-02 NOTE — Discharge Instructions (Signed)
FOLLOW UP WITH YOUR DOCTOR THIS WEEK FOR RECHECK. CONTINUE YOUR CURRENT MEDICATIONS AND TAKE ADDITIONAL MEDICATIONS AS PRESCRIBED.   Hyponatremia  Hyponatremia is when the amount of salt (sodium) in your blood is too low. When sodium levels are low, your cells will absorb extra water and swell. The swelling happens throughout the body, but it mostly affects the brain. Severe brain swelling (cerebral edema), seizures, or coma can happen.  CAUSES   Heart, kidney, or liver problems.   Thyroid problems.   Adrenal gland problems.   Severe vomiting and diarrhea.   Certain medicines or illegal drugs.   Dehydration.   Drinking too much water.   Low-sodium diet.  SYMPTOMS   Nausea and vomiting.   Confusion.   Lethargy.   Agitation.   Headache.   Twitching or shaking (seizures).   Unconsciousness.   Appetite loss.   Muscle weakness and cramping.  DIAGNOSIS  Hyponatremia is identified by a simple blood test. Your caregiver will perform a history and physical exam to try to find the cause and type of hyponatremia. Other tests may be needed to measure the amount of sodium in your blood and urine. TREATMENT  Treatment will depend on the cause.   Fluids may be given through the vein (IV).   Medicines may be used to correct the sodium imbalance. If medicines are causing the problem, they will need to be adjusted.   Water or fluid intake may be restricted to restore proper balance.  The speed of correcting the sodium problem is very important. If the problem is corrected too fast, nerve damage (sometimes unchangeable) can happen. HOME CARE INSTRUCTIONS   Only take medicines as directed by your caregiver. Many medicines can make hyponatremia worse. Discuss all your medicines with your caregiver.   Carefully follow any recommended diet, including any fluid restrictions.   You may be asked to repeat lab tests. Follow these directions.   Avoid alcohol and recreational drugs.    SEEK MEDICAL CARE IF:   You develop worsening nausea, fatigue, headache, confusion, or weakness.   Your original hyponatremia symptoms return.   You have problems following the recommended diet.  SEEK IMMEDIATE MEDICAL CARE IF:   You have a seizure.   You faint.   You have ongoing diarrhea or vomiting.  MAKE SURE YOU:   Understand these instructions.   Will watch your condition.   Will get help right away if you are not doing well or get worse.  Document Released: 10/27/2002 Document Revised: 07/19/2011 Document Reviewed: 04/23/2011 [facility] Patient Information 2012 [place], [place].

## 2012-01-02 NOTE — ED Notes (Signed)
Pt given gown to put on; family at bedside 

## 2012-01-02 NOTE — ED Notes (Addendum)
Pt d/c home in NAD. Pt states pain has been going down. Pt ambulated with quick, steady gait. Pt instructed not to drive on meds

## 2012-01-05 ENCOUNTER — Telehealth: Payer: Self-pay | Admitting: Dietician

## 2012-01-05 ENCOUNTER — Ambulatory Visit (INDEPENDENT_AMBULATORY_CARE_PROVIDER_SITE_OTHER): Payer: Medicare Other | Admitting: Internal Medicine

## 2012-01-05 ENCOUNTER — Telehealth: Payer: Self-pay | Admitting: *Deleted

## 2012-01-05 ENCOUNTER — Encounter: Payer: Self-pay | Admitting: Internal Medicine

## 2012-01-05 DIAGNOSIS — E119 Type 2 diabetes mellitus without complications: Secondary | ICD-10-CM

## 2012-01-05 DIAGNOSIS — E871 Hypo-osmolality and hyponatremia: Secondary | ICD-10-CM

## 2012-01-05 DIAGNOSIS — F411 Generalized anxiety disorder: Secondary | ICD-10-CM

## 2012-01-05 DIAGNOSIS — M549 Dorsalgia, unspecified: Secondary | ICD-10-CM

## 2012-01-05 DIAGNOSIS — M4800 Spinal stenosis, site unspecified: Secondary | ICD-10-CM

## 2012-01-05 DIAGNOSIS — E7800 Pure hypercholesterolemia, unspecified: Secondary | ICD-10-CM

## 2012-01-05 LAB — COMPLETE METABOLIC PANEL WITH GFR
Albumin: 4 g/dL (ref 3.5–5.2)
Alkaline Phosphatase: 78 U/L (ref 39–117)
CO2: 29 mEq/L (ref 19–32)
Chloride: 93 mEq/L — ABNORMAL LOW (ref 96–112)
GFR, Est African American: >89 mL/min
GFR, Est Non African American: >89 mL/min
Glucose, Bld: 130 mg/dL — ABNORMAL HIGH (ref 70–99)
Potassium: 4.3 mEq/L (ref 3.5–5.3)
Sodium: 133 mEq/L — ABNORMAL LOW (ref 135–145)
Total Protein: 6.8 g/dL (ref 6.0–8.3)

## 2012-01-05 LAB — LIPID PANEL
Cholesterol: 172 mg/dL (ref 0–200)
LDL Cholesterol: 101 mg/dL — ABNORMAL HIGH (ref 0–99)
Total CHOL/HDL Ratio: 3.9 Ratio

## 2012-01-05 MED ORDER — TRAMADOL HCL 50 MG PO TABS: 50.0000 mg | ORAL_TABLET | Freq: Four times a day (QID) | ORAL | Status: DC | PRN

## 2012-01-05 NOTE — Patient Instructions (Signed)
Please schedule a follow up appointment in with your PCP as needed. Please bring your medication bottles with your next appointment. Please take your medicines as prescribed. I will call you with your lab results if anything will be abnormal.

## 2012-01-05 NOTE — Telephone Encounter (Signed)
Patient brought in [employer] compact plus meter and said she was getting an error message and wasting strips. CDE ran a control which worked fine. CDE then called [employer] who is mailing patient control solution and more strips. CDE reviewed storage of meter away form kitchen or bathroom, told patient to call toll free number after she receives strips and control solution for assistance in running controls and using meter.

## 2012-01-05 NOTE — Telephone Encounter (Signed)
Call from pt - requesting lab result esp NA+ level which was drawn this am. Telephone # [phone].

## 2012-01-05 NOTE — Telephone Encounter (Signed)
I called the patient to discuss her labs. Mary Gray

## 2012-01-05 NOTE — Assessment & Plan Note (Addendum)
Probably from increased free water intake versus hypothyroidism. Recheck her BMET today.  - update :Her sodium levels continue to improve. Patient was called and informed about the results.

## 2012-01-05 NOTE — Telephone Encounter (Signed)
NA+ level 133 (up from 130) - pt informed per Dr. Meredith Pel. Pt made awared if anything else needs to be done, Dr Dorthula Rue will call her.

## 2012-01-05 NOTE — Assessment & Plan Note (Signed)
ER followup for probable anxiety related somatic complaints. She was given Klonopin at the ER discharge and states that she would like to continue on that. Of note, it was noted that patient gets her Xanax prescriptions from her psychiatrist. -She was clearly educated that she cannot be on two benzodiazepines and further, not from two different providers. She was offered to be started on SSRIs for her anxiety but she wasn't ready and wanted to discuss that with her psychiatrist first.

## 2012-01-05 NOTE — Progress Notes (Signed)
  Subjective:    Patient ID: Mary Gray, female    DOB: September 26, 1961, 51 y.o.   MRN: [id number]  HPI: 51 year old woman with past medical history significant for diabetes, chronic low back pain, anxiety disorder comes to the clinic as ER followup.  She was recently seen in the ER on 01/02/12 for myalgias, chest tightness and hyponatremia. It was thought that her complaints could be related to chronic anxiety and could have some somatization component to it , and after correction of sodium levels with fluids( 123>130), she was discharged home  with a followup scheduled in the clinic.  1)Back pain: She continues to complain of chronic low back pain. She had an appointment with neurosurgeon on the 01/02/12, when she was in the ER and therefore could not make for the appointment.  2) Anxiety: She was discharged home on Klonopin for anxiety. Patient states that it's helping her a little bit and would like to try that.    She states that she has an appointment at court for personal problems and is requesting a letter to postpone it further, because of her back pain.   Review of Systems  Constitutional: Negative for fever, chills and fatigue.  HENT: Negative for congestion and rhinorrhea.   Respiratory: Negative for cough, choking, shortness of breath, wheezing and stridor.   Cardiovascular: Negative for chest pain, palpitations and leg swelling.  Gastrointestinal: Negative for nausea and abdominal pain.  Neurological: Negative for dizziness and headaches.  Hematological: Negative for adenopathy.       Objective:   Physical Exam  Constitutional: She is oriented to person, place, and time. She appears well-developed and well-nourished.       Uses wheelchair and cane for mobility.  HENT:  Head: Normocephalic and atraumatic.  Mouth/Throat: No oropharyngeal exudate.  Eyes: Conjunctivae and EOM are normal. Pupils are equal, round, and reactive to light.  Neck: Normal range of motion. Neck supple.  No JVD present. No tracheal deviation present. No thyromegaly present.  Cardiovascular: Normal rate, regular rhythm and intact distal pulses.  Exam reveals no gallop and no friction rub.   No murmur heard. Pulmonary/Chest: Effort normal and breath sounds normal. No stridor. No respiratory distress. She has no wheezes. She has no rales.  Abdominal: Soft. Bowel sounds are normal. She exhibits no distension. There is no tenderness. There is no rebound and no guarding.  Musculoskeletal: Normal range of motion. She exhibits no edema and no tenderness.  Lymphadenopathy:    She has no cervical adenopathy.  Neurological: She is alert and oriented to person, place, and time. She has normal reflexes.          Assessment & Plan:

## 2012-01-05 NOTE — Telephone Encounter (Signed)
Patient was informed about her lab results.  Thanks, [employer]

## 2012-01-05 NOTE — Telephone Encounter (Signed)
Pt calls and states she is waiting by the phone and dr Dorthula Rue has not called her back, she states she does not want to call the "ambulance" and "they told her not to anyway" but ... i informed her dr Coralyn Pear is seeing pts and will call her this evening she states she will wait beside the ph

## 2012-01-05 NOTE — Assessment & Plan Note (Signed)
Check lipid profile and CMP.

## 2012-01-05 NOTE — Assessment & Plan Note (Signed)
She had an appointment with her neurosurgeon on 01/02/12  when she was in the ER and therefore she could not make for the appointment. -Patient was advised to reschedule her appointment.

## 2012-01-15 ENCOUNTER — Encounter: Payer: Self-pay | Admitting: Internal Medicine

## 2012-01-15 ENCOUNTER — Telehealth: Payer: Self-pay | Admitting: *Deleted

## 2012-01-15 NOTE — Telephone Encounter (Signed)
Pt called for letter tomorrow for court, states mamie knows what she needs, spoke w/ dr Meredith Pel and Harden Mo, dr Meredith Pel will speak w/ pt

## 2012-01-15 NOTE — Telephone Encounter (Signed)
Letter printed and signed - nurse to complete.

## 2012-01-19 ENCOUNTER — Other Ambulatory Visit: Payer: Self-pay | Admitting: *Deleted

## 2012-01-19 DIAGNOSIS — G8929 Other chronic pain: Secondary | ICD-10-CM

## 2012-01-19 MED ORDER — CARISOPRODOL 350 MG PO TABS: 350.0000 mg | ORAL_TABLET | Freq: Four times a day (QID) | ORAL | Status: DC | PRN

## 2012-01-19 NOTE — Telephone Encounter (Signed)
Rx called in 

## 2012-01-24 ENCOUNTER — Encounter: Payer: Medicare Other | Admitting: Internal Medicine

## 2012-02-14 ENCOUNTER — Encounter: Payer: Self-pay | Admitting: Internal Medicine

## 2012-02-14 ENCOUNTER — Ambulatory Visit (INDEPENDENT_AMBULATORY_CARE_PROVIDER_SITE_OTHER): Payer: Medicare Other | Admitting: Internal Medicine

## 2012-02-14 VITALS — BP 129/76 | HR 76 | Temp 97.3°F | Ht 61.0 in | Wt 231.5 lb

## 2012-02-14 DIAGNOSIS — M549 Dorsalgia, unspecified: Secondary | ICD-10-CM

## 2012-02-14 DIAGNOSIS — I10 Essential (primary) hypertension: Secondary | ICD-10-CM

## 2012-02-14 DIAGNOSIS — M5430 Sciatica, unspecified side: Secondary | ICD-10-CM

## 2012-02-14 DIAGNOSIS — M5440 Lumbago with sciatica, unspecified side: Secondary | ICD-10-CM

## 2012-02-14 DIAGNOSIS — G8929 Other chronic pain: Secondary | ICD-10-CM

## 2012-02-14 DIAGNOSIS — E119 Type 2 diabetes mellitus without complications: Secondary | ICD-10-CM

## 2012-02-14 MED ORDER — CARISOPRODOL 350 MG PO TABS: 350.0000 mg | ORAL_TABLET | Freq: Four times a day (QID) | ORAL | Status: DC | PRN

## 2012-02-14 MED ORDER — TRAMADOL HCL 50 MG PO TABS: 50.0000 mg | ORAL_TABLET | Freq: Four times a day (QID) | ORAL | Status: DC | PRN

## 2012-02-14 MED ORDER — HYDROMORPHONE HCL 2 MG PO TABS: 2.0000 mg | ORAL_TABLET | Freq: Every day | ORAL | Status: DC | PRN

## 2012-02-14 NOTE — Assessment & Plan Note (Signed)
Lab Results  Component Value Date   HGBA1C 6.6 02/14/2012   CREATININE 0.55 01/05/2012   CHOL 172 01/05/2012   HDL 44 01/05/2012   TRIG 135 01/05/2012    Assessment: Diabetes control: controlled Progress toward goals: at goal Barriers to meeting goals: no barriers identified  Plan: Diabetes treatment: continue current medications Refer to: none Instruction/counseling given: discussed foot care

## 2012-02-14 NOTE — Assessment & Plan Note (Signed)
Assessment: Patient has chronic low back pain with sciatica in the setting of spinal stenosis.  Plan: I refilled her carisoprodol and tramadol today, and I also refilled a prescription for hydromorphone 2 mg; with instruction to use daily if needed for pain unrelieved by tramadol.  I advised her to use the hydromorphone sparingly.  I also advised her to reschedule her appointment with her neurosurgeon Dr. Lovell Sheehan, and she agreed to do so.

## 2012-02-14 NOTE — Progress Notes (Signed)
  Subjective:    Patient ID: GAYNELL EGGLETON, female    DOB: Sep 12, 1961, 51 y.o.   MRN: [id number]  HPI Patient returns for followup of her diabetes mellitus, hypertension, and spinal stenosis with chronic low back pain.  She reports feeling somewhat better than at the time of her last visit.  She still has ongoing problems with low back pain and sciatica.  She has recently been out on her carisoprodol, tramadol, and hydromorphone; she was previously taking hydromorphone for breakthrough pain.  She missed her scheduled appointment with Dr. Lovell Sheehan.  She reports that she is compliant with her medications.  Review of Systems  Musculoskeletal: Positive for back pain.      Objective:   Physical Exam  Constitutional: No distress.  Cardiovascular: Normal rate, regular rhythm and normal heart sounds.  Exam reveals no gallop and no friction rub.   No murmur heard. Pulmonary/Chest: Effort normal and breath sounds normal. No respiratory distress. She has no wheezes. She has no rales.  Musculoskeletal: She exhibits no edema.        Assessment & Plan:

## 2012-02-14 NOTE — Patient Instructions (Signed)
Please schedule a follow up appointment with your neurosurgeon Dr. Lovell Sheehan. Use the hydromorphone sparingly and only for pain that is nor relieved by tramadol.

## 2012-02-14 NOTE — Assessment & Plan Note (Signed)
Lab Results  Component Value Date   NA 133* 01/05/2012   K 4.3 01/05/2012   CL 93* 01/05/2012   CO2 29 01/05/2012   BUN 5* 01/05/2012   CREATININE 0.55 01/05/2012    BP Readings from Last 3 Encounters:  02/14/12 129/76  01/05/12 126/80  01/02/12 124/67    Assessment: Hypertension control:  controlled  Progress toward goals:  at goal Barriers to meeting goals:  no barriers identified  Plan: Hypertension treatment:  continue current medications

## 2012-02-26 ENCOUNTER — Telehealth: Payer: Self-pay | Admitting: *Deleted

## 2012-02-26 MED ORDER — NYSTATIN 100000 UNIT/GM EX POWD: CUTANEOUS | Status: DC

## 2012-02-26 NOTE — Telephone Encounter (Signed)
Pt called asking for Rx for powder for a rash on her chest.  She has used a powder in past and wants a refill. Pt has been on Nystatin powder in past so will forward to PCP for refill if appropriate!!

## 2012-02-26 NOTE — Telephone Encounter (Signed)
Refilled as requested  

## 2012-02-29 ENCOUNTER — Other Ambulatory Visit: Payer: Self-pay | Admitting: Neurosurgery

## 2012-02-29 DIAGNOSIS — M549 Dorsalgia, unspecified: Secondary | ICD-10-CM

## 2012-02-29 DIAGNOSIS — M5410 Radiculopathy, site unspecified: Secondary | ICD-10-CM

## 2012-03-05 ENCOUNTER — Other Ambulatory Visit: Payer: Medicare Other

## 2012-03-05 ENCOUNTER — Other Ambulatory Visit: Payer: Self-pay | Admitting: Internal Medicine

## 2012-03-12 ENCOUNTER — Ambulatory Visit
Admission: RE | Admit: 2012-03-12 | Discharge: 2012-03-12 | Disposition: A | Discharge status: Home / Self Care | Payer: Medicare Other | Source: Ambulatory Visit | Attending: Neurosurgery | Admitting: Neurosurgery

## 2012-03-12 VITALS — BP 93/54 | HR 90

## 2012-03-12 DIAGNOSIS — M549 Dorsalgia, unspecified: Secondary | ICD-10-CM

## 2012-03-12 DIAGNOSIS — M5440 Lumbago with sciatica, unspecified side: Secondary | ICD-10-CM

## 2012-03-12 DIAGNOSIS — M5410 Radiculopathy, site unspecified: Secondary | ICD-10-CM

## 2012-03-12 MED ORDER — ONDANSETRON HCL 4 MG/2ML IJ SOLN
4.0000 mg | Freq: Once | INTRAMUSCULAR | Status: AC
  Administered 2012-03-12: 4 mg via INTRAMUSCULAR

## 2012-03-12 MED ORDER — DIAZEPAM 5 MG PO TABS
5.0000 mg | ORAL_TABLET | Freq: Once | ORAL | Status: AC
  Administered 2012-03-12: 5 mg via ORAL

## 2012-03-12 MED ORDER — IOHEXOL 180 MG/ML  SOLN
15.0000 mL | Freq: Once | INTRAMUSCULAR | Status: AC | PRN
  Administered 2012-03-12: 15 mL via INTRATHECAL

## 2012-03-12 MED ORDER — ONDANSETRON HCL 4 MG/2ML IJ SOLN: 4.0000 mg | Freq: Four times a day (QID) | INTRAMUSCULAR | Status: DC | PRN

## 2012-03-12 MED ORDER — HYDROMORPHONE HCL PF 2 MG/ML IJ SOLN
2.0000 mg | Freq: Once | INTRAMUSCULAR | Status: AC
  Administered 2012-03-12: 2 mg via INTRAMUSCULAR

## 2012-03-12 NOTE — Progress Notes (Signed)
Pt states she has been off of her tramadol for the past  2 days.

## 2012-03-12 NOTE — Discharge Instructions (Signed)
Myelogram Discharge Instructions  1. Go home and rest quietly for the next 24 hours.  It is important to lie flat for the next 24 hours.  Get up only to go to the restroom.  You may lie in the bed or on a couch on your back, your stomach, your left side or your right side.  You may have one pillow under your head.  You may have pillows between your knees while you are on your side or under your knees while you are on your back.  2. DO NOT drive today.  Recline the seat as far back as it will go, while still wearing your seat belt, on the way home.  3. You may get up to go to the bathroom as needed.  You may sit up for 10 minutes to eat.  You may resume your normal diet and medications unless otherwise indicated.  Drink lots of extra fluids today and tomorrow.  4. The incidence of headache, nausea, or vomiting is about 5% (one in 20 patients).  If you develop a headache, lie flat and drink plenty of fluids until the headache goes away.  Caffeinated beverages may be helpful.  If you develop severe nausea and vomiting or a headache that does not go away with flat bed rest, call [phone].  5. You may resume normal activities after your 24 hours of bed rest is over; however, do not exert yourself strongly or do any heavy lifting tomorrow. If when you get up you have a headache when standing, go back to bed and force fluids for another 24 hours.  6. Call your physician for a follow-up appointment.  The results of your myelogram will be sent directly to your physician by the following day.  7. If you have any questions or if complications develop after you arrive home, please call [phone].  Discharge instructions have been explained to the patient.  The patient, or the person responsible for the patient, fully understands these instructions.       May resume tramadol on March 13, 2012, after 9:30 am.

## 2012-03-13 ENCOUNTER — Telehealth: Payer: Self-pay | Admitting: Radiology

## 2012-03-13 NOTE — Telephone Encounter (Signed)
Instructed pt to use ice to back 20-30 minutes at a time, 3-4 times today and tomorrow. Take pain meds as ordered and to use her aleve as ordered. Explained it is not unusual for some pt's to be more sore from irritation of contrast, 24 hours of bedrest and that she had been off her tramadol the last 36 hours.dd

## 2012-03-19 ENCOUNTER — Other Ambulatory Visit: Payer: Self-pay | Admitting: Internal Medicine

## 2012-04-10 ENCOUNTER — Ambulatory Visit (HOSPITAL_COMMUNITY)
Admission: RE | Admit: 2012-04-10 | Discharge: 2012-04-10 | Disposition: A | Discharge status: Home / Self Care | Payer: Medicare Other | Source: Ambulatory Visit | Attending: Internal Medicine | Admitting: Internal Medicine

## 2012-04-10 ENCOUNTER — Encounter: Payer: Self-pay | Admitting: Internal Medicine

## 2012-04-10 ENCOUNTER — Ambulatory Visit (INDEPENDENT_AMBULATORY_CARE_PROVIDER_SITE_OTHER): Payer: Medicare HMO | Admitting: Internal Medicine

## 2012-04-10 VITALS — BP 115/74 | HR 96 | Temp 98.2°F | Ht 61.0 in | Wt 238.8 lb

## 2012-04-10 DIAGNOSIS — R21 Rash and other nonspecific skin eruption: Secondary | ICD-10-CM

## 2012-04-10 DIAGNOSIS — R0600 Dyspnea, unspecified: Secondary | ICD-10-CM

## 2012-04-10 DIAGNOSIS — R600 Localized edema: Secondary | ICD-10-CM | POA: Insufficient documentation

## 2012-04-10 DIAGNOSIS — M5440 Lumbago with sciatica, unspecified side: Secondary | ICD-10-CM

## 2012-04-10 DIAGNOSIS — I10 Essential (primary) hypertension: Secondary | ICD-10-CM

## 2012-04-10 DIAGNOSIS — R05 Cough: Secondary | ICD-10-CM | POA: Insufficient documentation

## 2012-04-10 DIAGNOSIS — E119 Type 2 diabetes mellitus without complications: Secondary | ICD-10-CM

## 2012-04-10 DIAGNOSIS — E7800 Pure hypercholesterolemia, unspecified: Secondary | ICD-10-CM

## 2012-04-10 DIAGNOSIS — R609 Edema, unspecified: Secondary | ICD-10-CM

## 2012-04-10 DIAGNOSIS — E039 Hypothyroidism, unspecified: Secondary | ICD-10-CM

## 2012-04-10 DIAGNOSIS — M5430 Sciatica, unspecified side: Secondary | ICD-10-CM

## 2012-04-10 DIAGNOSIS — R0602 Shortness of breath: Secondary | ICD-10-CM | POA: Insufficient documentation

## 2012-04-10 DIAGNOSIS — R059 Cough, unspecified: Secondary | ICD-10-CM | POA: Insufficient documentation

## 2012-04-10 DIAGNOSIS — R0609 Other forms of dyspnea: Secondary | ICD-10-CM

## 2012-04-10 LAB — GLUCOSE, CAPILLARY: Glucose-Capillary: 162 mg/dL — ABNORMAL HIGH (ref 70–99)

## 2012-04-10 LAB — BASIC METABOLIC PANEL WITH GFR
CO2: 29 mEq/L (ref 19–32)
Calcium: 9 mg/dL (ref 8.4–10.5)
GFR, Est African American: >89 mL/min
Potassium: 4 mEq/L (ref 3.5–5.3)
Sodium: 133 mEq/L — ABNORMAL LOW (ref 135–145)

## 2012-04-10 MED ORDER — GLUCOSE BLOOD VI STRP: ORAL_STRIP | Status: DC

## 2012-04-10 MED ORDER — HYDROMORPHONE HCL 2 MG PO TABS: 2.0000 mg | ORAL_TABLET | Freq: Every day | ORAL | Status: DC | PRN

## 2012-04-10 MED ORDER — TRIAMCINOLONE ACETONIDE 0.1 % EX CREA: TOPICAL_CREAM | Freq: Two times a day (BID) | CUTANEOUS | Status: DC

## 2012-04-10 MED ORDER — HYDROCHLOROTHIAZIDE 12.5 MG PO CAPS: 25.0000 mg | ORAL_CAPSULE | Freq: Every day | ORAL | Status: DC

## 2012-04-10 MED ORDER — ENALAPRIL MALEATE 5 MG PO TABS: 5.0000 mg | ORAL_TABLET | Freq: Every day | ORAL | Status: DC

## 2012-04-10 MED ORDER — METFORMIN HCL ER 500 MG PO TB24: 1500.0000 mg | ORAL_TABLET | Freq: Every day | ORAL | Status: DC

## 2012-04-10 NOTE — Assessment & Plan Note (Signed)
Hemoglobin A1C  Date Value Range Status  02/14/2012 6.6   Final  11/01/2011 6.4   Final  08/09/2011 5.6   Final     Assessment: Diabetes control: Patient is at goal by most recent hemoglobin A1c, but her blood sugars have recently increased. Progress toward goals: unchanged Barriers to meeting goals: no barriers identified  Plan: Increase metformin XR to a dose of 1500 mg daily. Refer to: none Instruction/counseling given: reminded to bring medications to each visit

## 2012-04-10 NOTE — Assessment & Plan Note (Signed)
Assessment: Patient has mild bilateral leg edema on exam, but reports episodes of worse leg edema.  She also has some dyspnea which may be related to asthma, but is worse at night.  Plan: Increase hydrochlorothiazide to a dose of 25 mg daily.  Chest x-ray and 2-D echocardiogram.

## 2012-04-10 NOTE — Progress Notes (Signed)
  Subjective:    Patient ID: Mary Gray, female    DOB: 07-08-1961, 51 y.o.   MRN: [id number]  HPI Patient presents for follow up of diabetes mellitus, hypertension, low back pain with sciatica, and other medical problems.  Her main complaint today is ongoing low back pain radiating into her legs which is no better and possibly worse than her last clinic visit.  Since her last visit here, she had a myelogram performed and she has a followup appointment with her neurosurgeon Dr. Lovell Sheehan this afternoon. She reports increased swelling in her legs and feet sometimes to the point that she has difficulty getting her shoes on.  She reports dyspnea and wheezing more at night.  She also reports a rash on her abdomen which is a narrow band of erythema along her waistline below the umbilicus and which is quite pruritic.  She is wearing a back brace today, but says that the band of rash appeared yesterday and she had not been wearing a back brace.  She recently bought some new underwear and this may be associated with the rash.  Her blood sugars have been somewhat elevated since her last visit, moreso in the evening.  She reports that she is compliant with her medications; she did not bring her medications to clinic, so medication reconciliation was done using a list and by her report.   Review of Systems  Respiratory: Positive for shortness of breath and wheezing (Some wheezing at night.).   Cardiovascular: Positive for leg swelling. Negative for chest pain.  Gastrointestinal: Negative for vomiting and abdominal pain.  Genitourinary: Negative for dysuria and frequency.  Musculoskeletal: Positive for back pain.       Objective:   Physical Exam  Cardiovascular: Normal rate, regular rhythm and normal heart sounds.  Exam reveals no gallop and no friction rub.   No murmur heard.      Mild ankle edema bilaterally.  Pulmonary/Chest: Effort normal and breath sounds normal. She has no wheezes. She has no rales.    Abdominal: Soft. Bowel sounds are normal. There is no tenderness.  Skin:           Assessment & Plan:

## 2012-04-10 NOTE — Assessment & Plan Note (Signed)
Lipids:    Component Value Date/Time   CHOL 172 01/05/2012 0942   TRIG 135 01/05/2012 0942   HDL 44 01/05/2012 0942   LDLCALC 101* 01/05/2012 0942   VLDL 27 01/05/2012 0942   CHOLHDL 3.9 01/05/2012 0942    Assessment: Patient's LDL is near goal on current dose of rosuvastatin.  Plan: Continue rosuvastatin 10 mg daily.

## 2012-04-10 NOTE — Assessment & Plan Note (Signed)
TSH  Date Value Range Status  11/01/2011 1.972  0.350-4.500 (uIU/mL) Final    Assessment: Patient has no symptoms of thyroid dysfunction on current dose of levothyroxine.  Her TSH was normal in December of 2012.  Plan: Continue levothyroxine 75 mcg daily.

## 2012-04-10 NOTE — Assessment & Plan Note (Signed)
Assessment: The etiology of patient's dyspnea is not clear.  It may be due to asthma, although she has no wheezing on lung exam today.  She does have complaint of bilateral ankle and pedal edema which waxes and wanes.  Plan: Obtain chest x-ray, and 2-D echocardiogram to assess LV function and valves.  Increase hydrochlorothiazide to a dose of 25 mg daily.

## 2012-04-10 NOTE — Patient Instructions (Signed)
Increase metformin XR 500 mg to a dose of 3 tablets once daily. Increase hydrochlorothiazide (HCTZ) 12.5 mg to a dose of 2 capsules once daily. Stop enalapril (Vasotec) 10 mg daily. Start enalapril (Vasotec) 5 mg once daily. Apply triamcinolone 0.1% cream to skin rash on abdomen twice a day for one week; call if the rash persists or worsens. Please keep appointment for echocardiogram. Please keep appointment with your neurosurgeon as scheduled.

## 2012-04-10 NOTE — Assessment & Plan Note (Signed)
Dg Myelogram Lumbar  03/12/2012  IMPRESSION: 1. L2-L3 mild to moderate central stenosis and right paracentral disc extrusion that shows caudal migration.  Mild symmetric bilateral foraminal stenosis. 2.  L3-L4 multifactorial moderate central stenosis. Grade 1 anterolisthesis, uncoverage of the disc and calcified broad-based posterior disc bulge.  Bilateral lateral recess stenosis and moderate foraminal stenosis. Foraminal stenosis is most severe at this level and at L5-S1. 3.  L4-L5 moderate central stenosis with central disc extrusion. Caudal migration of disc material in the midline.  Right greater than left lateral recess stenosis.  Minimal foraminal stenosis. 4.  L5-S1 grade 1 anterolisthesis associated with facet arthrosis or possibly due to healed bilateral pars defects.  Moderate bilateral foraminal stenosis.    Assessment: Patient reports no improvement in her low back pain with sciatica.  A myelogram ordered by Dr. Lovell Sheehan in April showed results as above.  Plan: Patient will see Dr. Lovell Sheehan today for followup of the myelogram results.  For now, pending a decision regarding surgery by Dr. Lovell Sheehan, will continue tramadol supplemented by hydromorphone if needed for breakthrough pain.  Patient asked about taking over-the-counter ibuprofen (she currently occasionally takes 400 mg no more than twice a day), which does provide some relief of her pain.  I advised her that this is a reasonable thing to do so long as she is not having upper GI symptoms.

## 2012-04-10 NOTE — Assessment & Plan Note (Signed)
Assessment: Patient has a narrow erythematous band of rash around her waistline below the umbilicus which has the appearance of a mild contact dermatitis.  I suspect this relates to the waistband of her pants or underwear.  Plan: Will treat with topical triamcinolone 0.1% cream twice a day for one week.  I advised patient to let me know if the rash persists or worsens.  I also advised her to avoid contact of her waistband with this area, and to avoid using any new garments that were associated with the onset of this rash.

## 2012-04-10 NOTE — Assessment & Plan Note (Signed)
Lab Results  Component Value Date   NA 133* 01/05/2012   K 4.3 01/05/2012   CL 93* 01/05/2012   CO2 29 01/05/2012   BUN 5* 01/05/2012   CREATININE 0.55 01/05/2012    BP Readings from Last 3 Encounters:  04/10/12 115/74  03/12/12 93/54  02/14/12 129/76    Assessment: Hypertension control:  controlled  Progress toward goals:  at goal Barriers to meeting goals:  no barriers identified  Plan: Since I am increasing the patient's hydrochlorothiazide dose and her blood pressure is well controlled, the plan is to decrease enalapril from 10 mg daily to a dose of enalapril 5 mg daily.  I explained this to patient.  Will recheck blood pressure upon return to her

## 2012-04-29 ENCOUNTER — Other Ambulatory Visit: Payer: Self-pay | Admitting: *Deleted

## 2012-04-29 NOTE — Telephone Encounter (Signed)
Pt is asking for more dilaudid.  She is in constant pain, is having a hard time walking. She saw Dr. Lovell Sheehan and he plans on doing surgery in August. ( per patient) Pt's insurance will only pay for meds once a month, she needs more than one dilaudid a day.  Dr Lovell Sheehan will not give her pain meds until after surgery. Tramadol is not helping. Pt # [id number]

## 2012-04-30 MED ORDER — HYDROMORPHONE HCL 2 MG PO TABS: 2.0000 mg | ORAL_TABLET | Freq: Three times a day (TID) | ORAL | Status: DC | PRN

## 2012-04-30 NOTE — Telephone Encounter (Signed)
I increased the hydromorphone to one 2 mg tablet Q8H as needed for pain and signed prescription for #90; prescription left for refill nurse in clinic.  Please remind patient to keep her scheduled appointment with me on June 26.

## 2012-04-30 NOTE — Telephone Encounter (Signed)
Pt informed Rx is ready and reminded about appointment.

## 2012-04-30 NOTE — Progress Notes (Signed)
Addended by: Filomena Jungling C on: 04/30/2012 10:06 AM   Modules accepted: Orders

## 2012-05-09 ENCOUNTER — Ambulatory Visit (HOSPITAL_COMMUNITY)
Admission: RE | Admit: 2012-05-09 | Discharge: 2012-05-09 | Disposition: A | Discharge status: Home / Self Care | Payer: Medicare Other | Source: Ambulatory Visit | Attending: Internal Medicine | Admitting: Internal Medicine

## 2012-05-09 DIAGNOSIS — R600 Localized edema: Secondary | ICD-10-CM

## 2012-05-09 DIAGNOSIS — R0609 Other forms of dyspnea: Secondary | ICD-10-CM | POA: Insufficient documentation

## 2012-05-09 DIAGNOSIS — E119 Type 2 diabetes mellitus without complications: Secondary | ICD-10-CM | POA: Insufficient documentation

## 2012-05-09 DIAGNOSIS — Z87891 Personal history of nicotine dependence: Secondary | ICD-10-CM | POA: Insufficient documentation

## 2012-05-09 DIAGNOSIS — E785 Hyperlipidemia, unspecified: Secondary | ICD-10-CM | POA: Insufficient documentation

## 2012-05-09 DIAGNOSIS — E669 Obesity, unspecified: Secondary | ICD-10-CM | POA: Insufficient documentation

## 2012-05-09 DIAGNOSIS — R0602 Shortness of breath: Secondary | ICD-10-CM

## 2012-05-09 DIAGNOSIS — R0989 Other specified symptoms and signs involving the circulatory and respiratory systems: Secondary | ICD-10-CM | POA: Insufficient documentation

## 2012-05-09 DIAGNOSIS — R0600 Dyspnea, unspecified: Secondary | ICD-10-CM

## 2012-05-09 DIAGNOSIS — I10 Essential (primary) hypertension: Secondary | ICD-10-CM | POA: Insufficient documentation

## 2012-05-09 NOTE — Progress Notes (Signed)
  Echocardiogram 2D Echocardiogram has been performed.  [name] 05/09/2012, 5:07 PM

## 2012-05-11 ENCOUNTER — Other Ambulatory Visit: Payer: Self-pay | Admitting: Internal Medicine

## 2012-05-15 ENCOUNTER — Ambulatory Visit (INDEPENDENT_AMBULATORY_CARE_PROVIDER_SITE_OTHER): Payer: Medicare PPO | Admitting: Internal Medicine

## 2012-05-15 ENCOUNTER — Encounter: Payer: Self-pay | Admitting: Internal Medicine

## 2012-05-15 VITALS — BP 125/77 | HR 83 | Temp 97.7°F | Ht 61.0 in | Wt 233.5 lb

## 2012-05-15 DIAGNOSIS — E1140 Type 2 diabetes mellitus with diabetic neuropathy, unspecified: Secondary | ICD-10-CM | POA: Insufficient documentation

## 2012-05-15 DIAGNOSIS — L84 Corns and callosities: Secondary | ICD-10-CM | POA: Insufficient documentation

## 2012-05-15 DIAGNOSIS — J45909 Unspecified asthma, uncomplicated: Secondary | ICD-10-CM

## 2012-05-15 DIAGNOSIS — M549 Dorsalgia, unspecified: Secondary | ICD-10-CM

## 2012-05-15 DIAGNOSIS — R600 Localized edema: Secondary | ICD-10-CM

## 2012-05-15 DIAGNOSIS — M4800 Spinal stenosis, site unspecified: Secondary | ICD-10-CM

## 2012-05-15 DIAGNOSIS — Z79899 Other long term (current) drug therapy: Secondary | ICD-10-CM

## 2012-05-15 DIAGNOSIS — L719 Rosacea, unspecified: Secondary | ICD-10-CM

## 2012-05-15 DIAGNOSIS — E1142 Type 2 diabetes mellitus with diabetic polyneuropathy: Secondary | ICD-10-CM

## 2012-05-15 DIAGNOSIS — E1149 Type 2 diabetes mellitus with other diabetic neurological complication: Secondary | ICD-10-CM

## 2012-05-15 DIAGNOSIS — I10 Essential (primary) hypertension: Secondary | ICD-10-CM

## 2012-05-15 DIAGNOSIS — D509 Iron deficiency anemia, unspecified: Secondary | ICD-10-CM

## 2012-05-15 DIAGNOSIS — R609 Edema, unspecified: Secondary | ICD-10-CM

## 2012-05-15 DIAGNOSIS — E119 Type 2 diabetes mellitus without complications: Secondary | ICD-10-CM

## 2012-05-15 LAB — GLUCOSE, CAPILLARY: Glucose-Capillary: 133 mg/dL — ABNORMAL HIGH (ref 70–99)

## 2012-05-15 LAB — POCT GLYCOSYLATED HEMOGLOBIN (HGB A1C): Hemoglobin A1C: 6.7

## 2012-05-15 MED ORDER — TRAMADOL HCL 50 MG PO TABS: 50.0000 mg | ORAL_TABLET | Freq: Four times a day (QID) | ORAL | Status: DC | PRN

## 2012-05-15 MED ORDER — HYDROMORPHONE HCL 2 MG PO TABS: 2.0000 mg | ORAL_TABLET | Freq: Four times a day (QID) | ORAL | Status: DC | PRN

## 2012-05-15 MED ORDER — FUROSEMIDE 20 MG PO TABS: 20.0000 mg | ORAL_TABLET | Freq: Every day | ORAL | Status: DC

## 2012-05-15 NOTE — Patient Instructions (Addendum)
Stop HCTZ (hydrochlorothiazide). Start furosemide (Lasix) 20 mg one tablet daily. Change hydromorphone 2 mg to a dose of one tablet every 6 hours if needed for pain unrelieved by tramadol; do not exceed prescribed dose. Please return within one week for lab work.

## 2012-05-15 NOTE — Assessment & Plan Note (Signed)
Hemoglobin A1C  Date Value Range Status  05/15/2012 6.7   Final  02/14/2012 6.6   Final  11/01/2011 6.4   Final     Assessment: Diabetes control: controlled Progress toward goals: at goal Barriers to meeting goals: no barriers identified  Plan: Diabetes treatment: continue current medications Refer to: none Instruction/counseling given: reminded to get eye exam

## 2012-05-15 NOTE — Assessment & Plan Note (Signed)
Assessment: This has been managed by dermatologist Dr. Terri Piedra.  Patient requested referral for ongoing management.  Plan: Refer patient to Dr. Terri Piedra for continuing care of her acne rosacea.

## 2012-05-15 NOTE — Assessment & Plan Note (Signed)
Assessment: Patient has ongoing severe low back pain with sciatic symptoms.  Her pain has been inadequately controlled on her current dose of hydromorphone 2 mg every 8 hours when necessary supplementing tramadol.  Her neurosurgeon Dr. Lovell Sheehan has discussed at length with her the option of back surgery, and she indicated today that she will likely proceed with this.  She has followup scheduled with Dr. Lovell Sheehan in August.  Plan: Increase hydromorphone to a dose of 2 mg every 6 hours when necessary pain for unrelieved by Tylenol.  I advised patient not to exceed the prescribed dose.  Will check a urine drug screen when she returns for labs this week.

## 2012-05-15 NOTE — Progress Notes (Signed)
  Subjective:    Patient ID: Mary Gray, female    DOB: 1961-08-05, 51 y.o.   MRN: [id number]  HPI Patient returns for followup of her diabetes mellitus, spinal stenosis with chronic back pain, hypertension, anemia, and other chronic problems.  Her main complaint today is ongoing severe low back pain radiating into her lower extremities.  She has subjective numbness and tingling in her feet bilaterally.  She reports that hydromorphone 2 mg every 8 hours as needed has not adequately controlled her pain.  She has followup scheduled with her neurosurgeon Dr. Lovell Sheehan in August.  She also has worsening bilateral ankle and pedal edema.  Her blood sugars over the past month show an average value of 150, ranging from 96-243, with 73% of values within range (please see the glucose meter download).   Review of Systems See history of present illness     Objective:   Physical Exam  Cardiovascular: Normal rate, regular rhythm and normal heart sounds.  Exam reveals no gallop and no friction rub.   No murmur heard.      1+ bilateral pedal edema  Pulmonary/Chest: Effort normal and breath sounds normal. No respiratory distress. She has no wheezes. She has no rales.  Skin:          Assessment & Plan:

## 2012-05-15 NOTE — Assessment & Plan Note (Addendum)
Assessment: Patient's ankle and pedal edema it is somewhat worse than last visit.  She reports that she has been compliant with her HCTZ 25 mg daily.  Her recent echocardiogram showed normal left ventricular function.  Plan: Stop hydrochlorothiazide and start furosemide 20 mg daily.  Patient will return for a metabolic panel this week.

## 2012-05-15 NOTE — Assessment & Plan Note (Signed)
Lab Results  Component Value Date   NA 133* 04/10/2012   K 4.0 04/10/2012   CL 92* 04/10/2012   CO2 29 04/10/2012   BUN 10 04/10/2012   CREATININE 0.63 04/10/2012    BP Readings from Last 3 Encounters:  05/15/12 125/77  04/10/12 115/74  03/12/12 93/54    Assessment: Hypertension control:  controlled  Progress toward goals:  at goal Barriers to meeting goals:  no barriers identified  Plan: Hypertension treatment:  continue current medications

## 2012-05-15 NOTE — Assessment & Plan Note (Signed)
Assessment: Patient has chronic bilateral subjective numbness and tingling in her feet, and has been on gabapentin in the past.  Foot exam is notable for calluses on both heels.  Plan: Continue current analgesic regimen.  Review chart to see why gabapentin was discontinued; if she had no problems on gabapentin, consider restarting that medication for treatment of her neuropathic symptoms.  This may help with her back pain as well.

## 2012-05-15 NOTE — Assessment & Plan Note (Signed)
Assessment: Patient is doing reasonably well with her current inhaler regimen.  Plan: I reviewed with her the appropriate use of her inhalers.

## 2012-05-15 NOTE — Assessment & Plan Note (Addendum)
Hemoglobin  Date Value Range Status  01/02/2012 11.8* 12.0 - 15.0 g/dL Final    Assessment: Patient has no symptoms of anemia.  Plan: Will check a CBC with labs upon return.

## 2012-05-19 ENCOUNTER — Other Ambulatory Visit: Payer: Self-pay | Admitting: Internal Medicine

## 2012-05-20 ENCOUNTER — Other Ambulatory Visit: Payer: Self-pay | Admitting: Internal Medicine

## 2012-05-21 ENCOUNTER — Other Ambulatory Visit (INDEPENDENT_AMBULATORY_CARE_PROVIDER_SITE_OTHER): Payer: Medicare Other

## 2012-05-21 DIAGNOSIS — R609 Edema, unspecified: Secondary | ICD-10-CM

## 2012-05-21 DIAGNOSIS — D509 Iron deficiency anemia, unspecified: Secondary | ICD-10-CM

## 2012-05-21 DIAGNOSIS — M549 Dorsalgia, unspecified: Secondary | ICD-10-CM

## 2012-05-21 DIAGNOSIS — R600 Localized edema: Secondary | ICD-10-CM

## 2012-05-21 LAB — CBC WITH DIFFERENTIAL/PLATELET
Eosinophils Absolute: 0.3 10*3/uL (ref 0.0–0.7)
Eosinophils Relative: 4 % (ref 0–5)
Hemoglobin: 11.9 g/dL — ABNORMAL LOW (ref 12.0–15.0)
Lymphs Abs: 1.9 10*3/uL (ref 0.7–4.0)
MCH: 29.1 pg (ref 26.0–34.0)
MCV: 83.6 fL (ref 78.0–100.0)
Monocytes Absolute: 0.4 10*3/uL (ref 0.1–1.0)
Monocytes Relative: 5 % (ref 3–12)
RBC: 4.09 MIL/uL (ref 3.87–5.11)

## 2012-05-22 LAB — PRESCRIPTION ABUSE MONITORING 15P, URINE
Amphetamine/Meth: NEGATIVE ng/mL
Cannabinoid Scrn, Ur: NEGATIVE ng/mL
Cocaine Metabolites: NEGATIVE ng/mL
Creatinine, Urine: 21.32 mg/dL (ref >20.0)
Fentanyl, Ur: NEGATIVE ng/mL
Methadone Screen, Urine: NEGATIVE ng/mL
Opiate Screen, Urine: POSITIVE ng/mL — ABNORMAL HIGH
Oxycodone Screen, Ur: NEGATIVE ng/mL
Zolpidem, Urine: POSITIVE ng/mL — ABNORMAL HIGH

## 2012-05-22 LAB — COMPLETE METABOLIC PANEL WITH GFR
ALT: 22 U/L (ref 0–35)
AST: 25 U/L (ref 0–37)
Chloride: 88 mEq/L — ABNORMAL LOW (ref 96–112)
Creat: 0.59 mg/dL (ref 0.50–1.10)
Sodium: 131 mEq/L — ABNORMAL LOW (ref 135–145)
Total Bilirubin: 0.5 mg/dL (ref 0.3–1.2)
Total Protein: 6.8 g/dL (ref 6.0–8.3)

## 2012-05-24 LAB — BENZODIAZEPINES (GC/LC/MS), URINE
Clonazepam metabolite (GC/LC/MS), ur confirm: NEGATIVE NG/ML
Flunitrazepam metabolite (GC/LC/MS), ur confirm: NEGATIVE NG/ML
Flurazepam metabolite (GC/LC/MS), ur confirm: NEGATIVE NG/ML
Lorazepam (GC/LC/MS), ur confirm: NEGATIVE NG/ML
Nordiazepam (GC/LC/MS), ur confirm: NEGATIVE NG/ML
Oxazepam (GC/LC/MS), ur confirm: NEGATIVE NG/ML
Temazepam (GC/LC/MS), ur confirm: NEGATIVE NG/ML

## 2012-05-24 LAB — ZOLPIDEM (LC/MS-MS), URINE: Zolpidem, Urine: NEGATIVE NG/ML

## 2012-05-24 LAB — OPIATES/OPIOIDS (LC/MS-MS)
Codeine Urine: NEGATIVE NG/ML
Heroin (6-AM), UR: NEGATIVE NG/ML
Morphine Urine: NEGATIVE NG/ML

## 2012-05-24 LAB — CARISOPRODOL (GC/LC/MS), URINE
CARISOPRODOL (GC/LC/MS), ur confirm: 488 NG/ML — ABNORMAL HIGH
Meprobamate (GC/LC/MS), ur confirm: 2540 NG/ML — ABNORMAL HIGH

## 2012-06-06 ENCOUNTER — Other Ambulatory Visit: Payer: Self-pay | Admitting: Internal Medicine

## 2012-06-07 NOTE — Telephone Encounter (Signed)
Pt has upcoming appt w/ dr Meredith Pel

## 2012-06-07 NOTE — Telephone Encounter (Signed)
Needs to be seen in clinic in the next month to re-eval this rash that keeps occurring.

## 2012-06-11 ENCOUNTER — Emergency Department (HOSPITAL_COMMUNITY)
Admission: EM | Admit: 2012-06-11 | Discharge: 2012-06-12 | Disposition: A | Discharge status: Home / Self Care | Payer: Medicare PPO | Attending: Emergency Medicine | Admitting: Emergency Medicine

## 2012-06-11 ENCOUNTER — Emergency Department (HOSPITAL_COMMUNITY): Payer: Medicare PPO

## 2012-06-11 ENCOUNTER — Encounter (HOSPITAL_COMMUNITY): Payer: Self-pay

## 2012-06-11 DIAGNOSIS — K219 Gastro-esophageal reflux disease without esophagitis: Secondary | ICD-10-CM | POA: Insufficient documentation

## 2012-06-11 DIAGNOSIS — Z79899 Other long term (current) drug therapy: Secondary | ICD-10-CM | POA: Insufficient documentation

## 2012-06-11 DIAGNOSIS — IMO0002 Reserved for concepts with insufficient information to code with codable children: Secondary | ICD-10-CM | POA: Insufficient documentation

## 2012-06-11 DIAGNOSIS — S81009A Unspecified open wound, unspecified knee, initial encounter: Secondary | ICD-10-CM | POA: Insufficient documentation

## 2012-06-11 DIAGNOSIS — Y92410 Unspecified street and highway as the place of occurrence of the external cause: Secondary | ICD-10-CM | POA: Insufficient documentation

## 2012-06-11 DIAGNOSIS — Z87891 Personal history of nicotine dependence: Secondary | ICD-10-CM | POA: Insufficient documentation

## 2012-06-11 DIAGNOSIS — S81812A Laceration without foreign body, left lower leg, initial encounter: Secondary | ICD-10-CM

## 2012-06-11 DIAGNOSIS — S91009A Unspecified open wound, unspecified ankle, initial encounter: Secondary | ICD-10-CM | POA: Insufficient documentation

## 2012-06-11 DIAGNOSIS — I10 Essential (primary) hypertension: Secondary | ICD-10-CM | POA: Insufficient documentation

## 2012-06-11 DIAGNOSIS — E7800 Pure hypercholesterolemia, unspecified: Secondary | ICD-10-CM | POA: Insufficient documentation

## 2012-06-11 DIAGNOSIS — E119 Type 2 diabetes mellitus without complications: Secondary | ICD-10-CM | POA: Insufficient documentation

## 2012-06-11 DIAGNOSIS — J45909 Unspecified asthma, uncomplicated: Secondary | ICD-10-CM | POA: Insufficient documentation

## 2012-06-11 DIAGNOSIS — Z23 Encounter for immunization: Secondary | ICD-10-CM | POA: Insufficient documentation

## 2012-06-11 MED ORDER — HYDROMORPHONE HCL PF 1 MG/ML IJ SOLN
1.0000 mg | Freq: Once | INTRAMUSCULAR | Status: AC
  Administered 2012-06-11: 1 mg via INTRAVENOUS
  Filled 2012-06-11: qty 1

## 2012-06-11 MED ORDER — TETANUS-DIPHTH-ACELL PERTUSSIS 5-2.5-18.5 LF-MCG/0.5 IM SUSP
0.5000 mL | Freq: Once | INTRAMUSCULAR | Status: AC
  Administered 2012-06-11: 0.5 mL via INTRAMUSCULAR
  Filled 2012-06-11: qty 0.5

## 2012-06-11 MED ORDER — ONDANSETRON HCL 4 MG/2ML IJ SOLN
4.0000 mg | Freq: Once | INTRAMUSCULAR | Status: AC
  Administered 2012-06-11: 4 mg via INTRAVENOUS
  Filled 2012-06-11: qty 2

## 2012-06-11 NOTE — ED Notes (Signed)
Pt ambulates to bathroom

## 2012-06-11 NOTE — ED Notes (Signed)
Patient return from X-ray 

## 2012-06-11 NOTE — ED Provider Notes (Signed)
I saw and evaluated the patient, reviewed the resident's note and I agree with the findings and plan.   I was available for key portions of laceration repair procedure performed under my supervision.  Pt is hemodynamically stable, VS stable.  No other injuries other than laceration to leg.  Can see PCP to have sutures removed in 10 days.  Mary Gray. [name], MD 06/11/12 2336

## 2012-06-11 NOTE — ED Provider Notes (Signed)
History     CSN: [id number]  Arrival date & time 06/11/12  2044   First MD Initiated Contact with Patient 06/11/12 2101      Chief Complaint  Patient presents with  . Extremity Laceration    (Consider location/radiation/quality/duration/timing/severity/associated sxs/prior treatment) Patient is a 51 y.o. female presenting with leg pain. The history is provided by the patient.  Leg Pain  The incident occurred less than 1 hour ago. The incident occurred in the street. The injury mechanism was a direct blow. The pain is present in the left leg. The quality of the pain is described as sharp. The pain is moderate. The pain has been constant since onset. Pertinent negatives include no numbness, no inability to bear weight, no loss of motion, no muscle weakness, no loss of sensation and no tingling. She reports no foreign bodies present. The symptoms are aggravated by palpation. She has tried nothing for the symptoms. The treatment provided no relief.    Past Medical History  Diagnosis Date  . Diabetes mellitus   . Hypertension   . Unspecified asthma   . Anxiety state, unspecified   . Hypercholesteremia   . Hypothyroidism   . Obesity   . GERD (gastroesophageal reflux disease)   . Iron deficiency anemia   . DDD (degenerative disc disease), lumbar   . Spinal stenosis   . Left knee pain     Following fall 11/15/2009.  Marland Kitchen Acne rosacea   . Heme positive stool     EGD on 04/08/2010 by Dr. Ewing Schlein showed chronic gastritis and a few gastric polyps; pathology showed a fundic gland polyp.  Colonoscopy on 04/08/2010 showed small external and internal hemorrhoids, and a few benign-appearing diminutive polyps in the rectum, the distal sigmoid colon, and in the distal descending colon; pathology showed hyperplastic polyps.   . Irregular menses   . Vagina bleeding   . Atrophic vaginitis   . Viral warts, unspecified     Right hand.  . Chronic interstitial cystitis     Followed by Dr. Marcine Matar  . Degeneration of cervical intervertebral disc   . Elevated transaminase level     Mild elevation, AST=48, ALT=51 on 02/01/07.  . Allergic rhinitis   . Hematochezia   . Low back pain   . Carpal tunnel syndrome     Past Surgical History  Procedure Date  . Carpal tunnel release 05/08/2000    By Dr. Katy Fitch. Sypher, Montez Hageman.    Family History  Problem Relation Age of Onset  . Heart attack Father 64  . Breast cancer Neg Hx   . Colon cancer Neg Hx   . Diabetes Mother   . Hypertension Mother     History  Substance Use Topics  . Smoking status: Former Smoker -- 1.0 packs/day for 15 years    Types: Cigarettes    Quit date: 01/18/1996  . Smokeless tobacco: Never Used  . Alcohol Use: No    OB History    Grav Para Term Preterm Abortions TAB SAB Ect Mult Living                  Review of Systems  Constitutional: Negative for fever, chills, diaphoresis and fatigue.  HENT: Negative for ear pain, congestion, sore throat, facial swelling, mouth sores, trouble swallowing, neck pain and neck stiffness.   Eyes: Negative.   Respiratory: Negative for apnea, cough, chest tightness, shortness of breath and wheezing.   Cardiovascular: Negative for chest pain, palpitations and leg swelling.  Gastrointestinal: Negative for nausea, vomiting, abdominal pain, diarrhea and abdominal distention.  Genitourinary: Negative for hematuria, flank pain, vaginal discharge, difficulty urinating and menstrual problem.  Musculoskeletal: Negative for back pain and gait problem.  Skin: Positive for wound. Negative for rash.  Neurological: Negative for dizziness, tingling, tremors, seizures, syncope, facial asymmetry, numbness and headaches.  Psychiatric/Behavioral: Negative.   All other systems reviewed and are negative.    Allergies  Codeine sulfate; Meperidine and related; Minocycline hcl; Penicillins; Propoxyphene-acetaminophen; Tetracycline hcl; and Vicodin  Home Medications   Current  Outpatient Rx  Name Route Sig Dispense Refill  . ALPRAZOLAM 1 MG PO TABS Oral Take 1 mg by mouth 4 (four) times daily as needed. For anxiety    . AMITRIPTYLINE HCL 10 MG PO TABS Oral Take 10 mg by mouth at bedtime.    . ASPIRIN 81 MG PO CHEW Oral Chew 81 mg by mouth daily.     Marland Kitchen CARISOPRODOL 350 MG PO TABS Oral Take 350 mg by mouth 4 (four) times daily as needed. For pain    . DOCUSATE SODIUM 100 MG PO CAPS Oral Take 100 mg by mouth 2 (two) times daily.     . ENALAPRIL MALEATE 5 MG PO TABS Oral Take 1 tablet (5 mg total) by mouth daily. 30 tablet 6  . ERYTHROMYCIN ETHYLSUCCINATE 400 MG PO TABS Oral Take 400 mg by mouth 2 (two) times daily.    Marland Kitchen ESOMEPRAZOLE MAGNESIUM 40 MG PO CPDR Oral Take 40 mg by mouth daily.    Marland Kitchen FLOVENT DISKUS 50 MCG/BLIST IN AEPB  INHALE 1 PUFF INTO THE LUNGS 2 (TWO) TIMES DAILY. 60 each 11  . FUROSEMIDE 20 MG PO TABS Oral Take 1 tablet (20 mg total) by mouth daily. 30 tablet 3  . GLUCOSE BLOOD VI STRP  Use for daily blood glucose testing.  Diagnosis ICD-9 250.00 100 each 3  . HYDROMORPHONE HCL 2 MG PO TABS Oral Take 1 tablet (2 mg total) by mouth every 6 (six) hours as needed for pain. 120 tablet 0  . IPRATROPIUM BROMIDE HFA 17 MCG/ACT IN AERS Inhalation Inhale 2 puffs into the lungs 4 (four) times daily as needed. For wheezing    . LEVOTHYROXINE SODIUM 75 MCG PO TABS Oral Take 75 mcg by mouth daily.    Marland Kitchen METFORMIN HCL ER 500 MG PO TB24 Oral Take 3 tablets (1,500 mg total) by mouth daily. 90 tablet 6  . POLYETHYLENE GLYCOL 3350 PO PACK Oral Take 17 g by mouth daily as needed. For regularity    . PROAIR HFA 108 (90 BASE) MCG/ACT IN AERS  INHALE 2 PUFFS INTO THE LUNGS EVERY 4 HOURS AS NEEDED. 2 Inhaler 6  . ROPINIROLE HCL 1 MG PO TABS Oral Take 3 mg by mouth at bedtime.     Marland Kitchen ROSUVASTATIN CALCIUM 10 MG PO TABS Oral Take 10 mg by mouth daily.    . SERTRALINE HCL 100 MG PO TABS Oral Take 150 mg by mouth daily.    Marland Kitchen SOLIFENACIN SUCCINATE 10 MG PO TABS Oral Take 10 mg by mouth  daily.      . TRAMADOL HCL 50 MG PO TABS Oral Take 50 mg by mouth every 6 (six) hours as needed. For pain    . ZOLPIDEM TARTRATE 10 MG PO TABS Oral Take 10 mg by mouth at bedtime as needed. For sleep      BP 131/85  Pulse 91  Temp 98.4 F (36.9 C) (Oral)  Resp 15  SpO2  97%  LMP 01/17/2009  Physical Exam  Nursing note and vitals reviewed. Constitutional: She is oriented to person, place, and time. She appears well-developed and well-nourished. No distress.  HENT:  Head: Normocephalic and atraumatic.  Right Ear: External ear normal.  Left Ear: External ear normal.  Nose: Nose normal.  Mouth/Throat: Oropharynx is clear and moist. No oropharyngeal exudate.  Eyes: Conjunctivae and EOM are normal. Pupils are equal, round, and reactive to light. Right eye exhibits no discharge. Left eye exhibits no discharge.  Neck: Normal range of motion. Neck supple. No JVD present. No tracheal deviation present. No thyromegaly present.  Cardiovascular: Normal rate, regular rhythm, normal heart sounds and intact distal pulses.  Exam reveals no gallop and no friction rub.   No murmur heard. Pulmonary/Chest: Effort normal and breath sounds normal. No respiratory distress. She has no wheezes. She has no rales. She exhibits no tenderness.  Abdominal: Soft. Bowel sounds are normal. She exhibits no distension. There is no tenderness. There is no rebound and no guarding.  Musculoskeletal: Normal range of motion.       Left lower leg: She exhibits tenderness, bony tenderness and laceration. She exhibits no swelling, no edema and no deformity.       Legs: Lymphadenopathy:    She has no cervical adenopathy.  Neurological: She is alert and oriented to person, place, and time. No cranial nerve deficit. Coordination normal.  Skin: Skin is warm. No rash noted. She is not diaphoretic.  Psychiatric: She has a normal mood and affect. Her behavior is normal. Judgment and thought content normal.    ED Course    LACERATION REPAIR Date/Time: 06/11/2012 11:12 PM Performed by: Sherryl Manges Authorized by: Sherryl Manges Consent: Verbal consent obtained. Risks and benefits: risks, benefits and alternatives were discussed Consent given by: patient Patient understanding: patient states understanding of the procedure being performed Patient consent: the patient's understanding of the procedure matches consent given Procedure consent: procedure consent matches procedure scheduled Relevant documents: relevant documents present and verified Required items: required blood products, implants, devices, and special equipment available Patient identity confirmed: arm band and verbally with patient Time out: Immediately prior to procedure a "time out" was called to verify the correct patient, procedure, equipment, support staff and site/side marked as required. Body area: lower extremity Location details: left lower leg Laceration length: 4 cm Foreign bodies: no foreign bodies Tendon involvement: none Nerve involvement: none Vascular damage: no Anesthesia: local infiltration Local anesthetic: lidocaine 2% with epinephrine Anesthetic total: 4 ml Patient sedated: no Preparation: Patient was prepped and draped in the usual sterile fashion. Irrigation solution: saline Irrigation method: jet lavage Amount of cleaning: extensive Debridement: none Degree of undermining: none Skin closure: 3-0 Prolene Number of sutures: 4 Technique: simple Approximation: close Approximation difficulty: simple Dressing: 4x4 sterile gauze Patient tolerance: Patient tolerated the procedure well with no immediate complications.   (including critical care time)  Labs Reviewed - No data to display No results found.   No diagnosis found.    MDM  51 year old female patient with past medical history hypertension diabetes chronic pain presents after lacerating left anterior shin. Patient says she was at home using an asked  to chop wood in her yard when she lost grip of the accident her anterior left shin. Patient with 2+ DP pulse and posterior tibial pulse normal movement of toes normal sensation. No apparent bony deformity but we'll get plain films to assess for any retained foreign bodies or possible bony injury. Update tetanus. Patient  with no other injuries on secondary exam. Will repair laceration      DG Tibia/Fibula Left (Final result)   Result time:06/11/12 2158    Final result by Rad Results In Interface (06/11/12 21:58:29)    Narrative:   *RADIOLOGY REPORT*  Clinical Data: Left lower tibia / fibula laceration from an axe.  LEFT TIBIA AND FIBULA - 2 VIEW  Comparison: Multiple exams, including 11/09 09/11 and 11/15/2009  Findings: Chronic proximal medial metaphyseal spurring of the tibia noted.  No fracture, foreign body, or acute bony findings are identified. Subtle soft tissue swelling and possible laceration along the soft tissues anterior to the tibia at the junction of the mid and distal thirds noted.  Achilles calcaneal spur observed.  IMPRESSION:  1. Suspected laceration anterior to the tibial diaphysis. No foreign body or underlying fracture observed.  Original Report Authenticated By: Dellia Cloud, M.D.     Laceration noted as above. Patient with no osseous injury. Patient given precautions to have stitches out in 5-7 days follow up here with PCP for wound recheck  Case discussed with Dr. Bernerd Limbo, MD 06/11/12 [phone]

## 2012-06-11 NOTE — ED Notes (Signed)
Per EMS self inflicted laceration to left lower leg with a hatchet. Bleeding controlled.

## 2012-06-11 NOTE — ED Notes (Signed)
Patient transported to X-ray 

## 2012-06-14 ENCOUNTER — Telehealth: Payer: Self-pay | Admitting: *Deleted

## 2012-06-14 NOTE — Telephone Encounter (Signed)
Pt called - went to ER this week - axe went into leg. Saw Dr Terri Piedra 06/14/12 - thought it needed more sutures - area bleeding. Suggest to go ER. Appt sch 06/19/12 Dr Meredith Pel. Pt is a diabetic. Stanton Kidney Leighann Amadon RN 06/14/12 4:15PM

## 2012-06-19 ENCOUNTER — Encounter: Payer: Self-pay | Admitting: Internal Medicine

## 2012-06-19 ENCOUNTER — Ambulatory Visit (INDEPENDENT_AMBULATORY_CARE_PROVIDER_SITE_OTHER): Payer: Medicare PPO | Admitting: Internal Medicine

## 2012-06-19 VITALS — BP 124/72 | HR 98 | Temp 97.5°F | Wt 221.1 lb

## 2012-06-19 DIAGNOSIS — S81009A Unspecified open wound, unspecified knee, initial encounter: Secondary | ICD-10-CM

## 2012-06-19 DIAGNOSIS — M545 Low back pain: Secondary | ICD-10-CM

## 2012-06-19 DIAGNOSIS — R600 Localized edema: Secondary | ICD-10-CM

## 2012-06-19 DIAGNOSIS — S81812A Laceration without foreign body, left lower leg, initial encounter: Secondary | ICD-10-CM

## 2012-06-19 DIAGNOSIS — M5440 Lumbago with sciatica, unspecified side: Secondary | ICD-10-CM

## 2012-06-19 DIAGNOSIS — E119 Type 2 diabetes mellitus without complications: Secondary | ICD-10-CM

## 2012-06-19 DIAGNOSIS — R609 Edema, unspecified: Secondary | ICD-10-CM

## 2012-06-19 DIAGNOSIS — W269XXA Contact with unspecified sharp object(s), initial encounter: Secondary | ICD-10-CM

## 2012-06-19 DIAGNOSIS — M5430 Sciatica, unspecified side: Secondary | ICD-10-CM

## 2012-06-19 MED ORDER — HYDROMORPHONE HCL 2 MG PO TABS: 2.0000 mg | ORAL_TABLET | Freq: Four times a day (QID) | ORAL | Status: DC | PRN

## 2012-06-19 NOTE — Progress Notes (Signed)
  Subjective:    Patient ID: Mary Gray, female    DOB: [birth year], 51 y.o.   MRN: [id number]  HPI Patient returns for followup of her bilateral leg edema, spinal stenosis with chronic low back pain and sciatica, diabetes mellitus, and other chronic medical problems.  She reports persisting low back pain with sciatica as before, and has been taking hydromorphone and tramadol as prescribed.  She has seen her neurosurgeon Dr. Lovell Sheehan who discussed at length with her the option of surgery, and she has an appointment with Dr. Lovell Sheehan in August for followup.  Her blood sugars have been reasonably well controlled, with an average value of 150 over the past month.  She reports that she is compliant with her medications.  She reports improvement in her leg edema on Lasix 20 mg daily, although she does have some mild persisting bilateral leg edema.  She was seen in the emergency department on 06/11/2012 for management of a laceration of her left shin that she sustained while chopping with a hatchet; the laceration was sutured in the ED.  She has noted no redness, drainage, or other signs of infection from the wound site .   Review of Systems See history of present illness.     Objective:   Physical Exam  Constitutional: No distress.  Cardiovascular: Normal rate, regular rhythm and normal heart sounds.  Exam reveals no gallop and no friction rub.   No murmur heard.      1+ bilateral ankle edema.  Pulmonary/Chest: Breath sounds normal. No respiratory distress. She has no wheezes. She has no rales.  Skin:           Assessment & Plan:

## 2012-06-19 NOTE — Assessment & Plan Note (Signed)
Assessment: Patient has chronic low back pain with sciatica due to spinal stenosis, and is followed by neurosurgeon Dr. Lovell Sheehan.  He has discussed at length with her the option of surgery, and she indicated today that she has decided to go ahead with back surgery.  Plan: Patient will follow up with neurosurgeon Dr. Lovell Sheehan as scheduled in August.  For now, continue hydromorphone 2 mg every 6 hours as needed for pain and tramadol 50 mg every 6 hours as needed for pain.  Patient signed a controlled medication agreement today which specifies her chronic hydromorphone treatment.

## 2012-06-19 NOTE — Assessment & Plan Note (Signed)
Hemoglobin A1C  Date Value Range Status  05/15/2012 6.7   Final  02/14/2012 6.6   Final  11/01/2011 6.4   Final     Assessment: Diabetes control: controlled Progress toward goals: at goal Barriers to meeting goals: no barriers identified  Plan: Diabetes treatment: continue current regimen of metformin XR 1500 mg daily. Refer to: none Instruction/counseling given: reminded to bring blood glucose meter & log to each visit and reminded to bring medications to each visit

## 2012-06-19 NOTE — Assessment & Plan Note (Signed)
Wt Readings from Last 3 Encounters:  06/19/12 221 lb 1.6 oz (100.29 kg)  05/15/12 233 lb 8 oz (105.915 kg)  04/10/12 238 lb 12.8 oz (108.319 kg)   Assessment: Patient's leg edema has significantly improved with the change from hydrochlorothiazide to furosemide 20 mg daily; her weight is down 12 pounds over the past month.  She still has mild ankle edema.  Plan: I advised patient to increase her furosemide to a dose of 40 mg daily for 3 days, and then to resume her previous 20 mg daily dose.  I advised her to weigh daily and to call if her weight changes more than 5 pounds in either direction from her baseline.  Will check a basic metabolic panel today.  Patient was prescribed compression stockings by her dermatologist, but has not yet obtained the stockings; she will wait until her left leg wound heals to apply the compression stockings.

## 2012-06-19 NOTE — Patient Instructions (Signed)
Increase Lasix 20 mg tablets to a dose of 2 tablets once daily for 3 days, then reduce to previous dose of one tablet daily. Please weigh daily and call if your weight changes in either direction by more than 5 pounds. Please follow up in the emergency room on Friday 06/21/2012 for recheck of your left leg wound and consideration of suture removal. Please followup with your neurosurgeon Dr. Lovell Sheehan in August as scheduled.

## 2012-06-19 NOTE — Progress Notes (Signed)
Left lower leg laceration, sutured by ED on 07/23 measuring ~4 cm in length.Mary Spittle Cassady7/31/201312:36 PM

## 2012-06-19 NOTE — Assessment & Plan Note (Signed)
Assessment: The laceration on the anterolateral aspect of the patient's left leg appears to be clean and without signs of infection.  The wound images are not in apposition.  She is now 8 days post suture placement in the emergency department.   Plan:  I advised her to followup in th ED in 2 days for wound evaluation and for consideration of suture removal.

## 2012-06-20 LAB — BASIC METABOLIC PANEL WITH GFR
BUN: 7 mg/dL (ref 6–23)
Chloride: 92 mEq/L — ABNORMAL LOW (ref 96–112)
GFR, Est African American: >89 mL/min
GFR, Est Non African American: >89 mL/min
Potassium: 3.4 mEq/L — ABNORMAL LOW (ref 3.5–5.3)
Sodium: 135 mEq/L (ref 135–145)

## 2012-06-21 ENCOUNTER — Other Ambulatory Visit: Payer: Self-pay | Admitting: Internal Medicine

## 2012-06-21 MED ORDER — POTASSIUM CHLORIDE ER 10 MEQ PO TBCR: EXTENDED_RELEASE_TABLET | ORAL | Status: DC

## 2012-06-21 NOTE — Progress Notes (Signed)
Quick Note:  Potassium is slightly low at 3.4. Plan is to start potassium chloride oral supplement 20 meQ daily for 1 week, then 10 meQ daily. Will recheck potassium level at next visit. I tried unsuccessfully to reach patient by phone today; will ask triage nurse to try again on Monday. ______

## 2012-06-21 NOTE — Progress Notes (Signed)
Potassium is slightly low at 3.4.  Plan is to start potassium chloride oral supplement 20 meQ daily for 1 week, then 10 meQ daily.  Will recheck potassium level at next visit.

## 2012-06-24 NOTE — Progress Notes (Signed)
Pt informed and voices understanding. She will make f/u appointment via front desk.

## 2012-06-24 NOTE — Progress Notes (Signed)
Pt informed and voices understanding. Appointment made for f/u

## 2012-06-25 ENCOUNTER — Emergency Department (HOSPITAL_COMMUNITY)
Admission: EM | Admit: 2012-06-25 | Discharge: 2012-06-25 | Disposition: A | Discharge status: Home / Self Care | Payer: Medicare HMO | Attending: Emergency Medicine | Admitting: Emergency Medicine

## 2012-06-25 ENCOUNTER — Encounter (HOSPITAL_COMMUNITY): Payer: Self-pay

## 2012-06-25 DIAGNOSIS — E669 Obesity, unspecified: Secondary | ICD-10-CM | POA: Insufficient documentation

## 2012-06-25 DIAGNOSIS — E039 Hypothyroidism, unspecified: Secondary | ICD-10-CM | POA: Insufficient documentation

## 2012-06-25 DIAGNOSIS — E7800 Pure hypercholesterolemia, unspecified: Secondary | ICD-10-CM | POA: Insufficient documentation

## 2012-06-25 DIAGNOSIS — G5600 Carpal tunnel syndrome, unspecified upper limb: Secondary | ICD-10-CM | POA: Insufficient documentation

## 2012-06-25 DIAGNOSIS — M51379 Other intervertebral disc degeneration, lumbosacral region without mention of lumbar back pain or lower extremity pain: Secondary | ICD-10-CM | POA: Insufficient documentation

## 2012-06-25 DIAGNOSIS — M5137 Other intervertebral disc degeneration, lumbosacral region: Secondary | ICD-10-CM | POA: Insufficient documentation

## 2012-06-25 DIAGNOSIS — Z4802 Encounter for removal of sutures: Secondary | ICD-10-CM | POA: Insufficient documentation

## 2012-06-25 DIAGNOSIS — Z87891 Personal history of nicotine dependence: Secondary | ICD-10-CM | POA: Insufficient documentation

## 2012-06-25 DIAGNOSIS — E119 Type 2 diabetes mellitus without complications: Secondary | ICD-10-CM | POA: Insufficient documentation

## 2012-06-25 DIAGNOSIS — I10 Essential (primary) hypertension: Secondary | ICD-10-CM | POA: Insufficient documentation

## 2012-06-25 MED ORDER — SULFAMETHOXAZOLE-TRIMETHOPRIM 800-160 MG PO TABS: 1.0000 | ORAL_TABLET | Freq: Two times a day (BID) | ORAL | Status: DC

## 2012-06-25 NOTE — ED Provider Notes (Signed)
History     CSN: [id number]  Arrival date & time 06/25/12  0850   First MD Initiated Contact with Patient 06/25/12 [phone]      Chief Complaint  Patient presents with  . Suture / Staple Removal    (Consider location/radiation/quality/duration/timing/severity/associated sxs/prior treatment) HPI History from patient. 51 year old female presents for wound check. She had sutures placed to the anterior left lower leg 12 days ago after she accidentally lacerated herself with an ax. She states that she has noted a small amount of serous sanguinous drainage from the area but has not noticed any purulent drainage. She denies any fever or chills. She has noted mild erythema around the area. No bleeding noted. She has been keeping the wound covered with a bandage, has been cleaning the area regularly with soap and water, and has been applying Neosporin.  Past Medical History  Diagnosis Date  . Diabetes mellitus   . Hypertension   . Unspecified asthma   . Anxiety state, unspecified   . Hypercholesteremia   . Hypothyroidism   . Obesity   . GERD (gastroesophageal reflux disease)   . Iron deficiency anemia   . DDD (degenerative disc disease), lumbar   . Spinal stenosis   . Left knee pain     Following fall 11/15/2009.  Marland Kitchen Acne rosacea   . Heme positive stool     EGD on 04/08/2010 by Dr. Ewing Schlein showed chronic gastritis and a few gastric polyps; pathology showed a fundic gland polyp.  Colonoscopy on 04/08/2010 showed small external and internal hemorrhoids, and a few benign-appearing diminutive polyps in the rectum, the distal sigmoid colon, and in the distal descending colon; pathology showed hyperplastic polyps.   . Irregular menses   . Vagina bleeding   . Atrophic vaginitis   . Viral warts, unspecified     Right hand.  . Chronic interstitial cystitis     Followed by Dr. Marcine Matar  . Degeneration of cervical intervertebral disc   . Elevated transaminase level     Mild elevation,  AST=48, ALT=51 on 02/01/07.  . Allergic rhinitis   . Hematochezia   . Low back pain   . Carpal tunnel syndrome     Past Surgical History  Procedure Date  . Carpal tunnel release 05/08/2000    By Dr. Katy Fitch. Sypher, Montez Hageman.    Family History  Problem Relation Age of Onset  . Heart attack Father 64  . Breast cancer Neg Hx   . Colon cancer Neg Hx   . Diabetes Mother   . Hypertension Mother     History  Substance Use Topics  . Smoking status: Former Smoker -- 1.0 packs/day for 15 years    Types: Cigarettes    Quit date: 01/18/1996  . Smokeless tobacco: Never Used  . Alcohol Use: No    OB History    Grav Para Term Preterm Abortions TAB SAB Ect Mult Living                  Review of Systems  Constitutional: Negative for fever and chills.  Musculoskeletal: Negative for myalgias.  Skin: Positive for wound.  Neurological: Negative for numbness.    Allergies  Codeine sulfate; Meperidine and related; Minocycline hcl; Penicillins; Propoxyphene-acetaminophen; Tetracycline hcl; and Vicodin  Home Medications   Current Outpatient Rx  Name Route Sig Dispense Refill  . ALBUTEROL SULFATE HFA 108 (90 BASE) MCG/ACT IN AERS Inhalation Inhale 2 puffs into the lungs every 6 (six) hours as needed. For shortness  of breath    . ALPRAZOLAM 1 MG PO TABS Oral Take 1 mg by mouth 4 (four) times daily as needed. For anxiety    . ASPIRIN 81 MG PO CHEW Oral Chew 81 mg by mouth daily.     Marland Kitchen CARISOPRODOL 350 MG PO TABS Oral Take 350 mg by mouth 4 (four) times daily as needed. For pain    . DOCUSATE SODIUM 100 MG PO CAPS Oral Take 100 mg by mouth 2 (two) times daily.     . ENALAPRIL MALEATE 5 MG PO TABS Oral Take 1 tablet (5 mg total) by mouth daily. 30 tablet 6  . ERYTHROMYCIN ETHYLSUCCINATE 400 MG PO TABS Oral Take 400 mg by mouth 2 (two) times daily.    Marland Kitchen ESOMEPRAZOLE MAGNESIUM 40 MG PO CPDR Oral Take 40 mg by mouth daily before breakfast.    . FLUTICASONE PROPIONATE 50 MCG/ACT NA SUSP Nasal  Place 2 sprays into the nose daily as needed. For allergies    . FLUTICASONE PROPIONATE (INHAL) 50 MCG/BLIST IN AEPB Inhalation Inhale 1 puff into the lungs 2 (two) times daily.    . FUROSEMIDE 20 MG PO TABS Oral Take 1 tablet (20 mg total) by mouth daily. 30 tablet 3  . HYDROMORPHONE HCL 2 MG PO TABS Oral Take 1 tablet (2 mg total) by mouth every 6 (six) hours as needed for pain. 120 tablet 0  . IPRATROPIUM BROMIDE HFA 17 MCG/ACT IN AERS Inhalation Inhale 2 puffs into the lungs 4 (four) times daily as needed. For wheezing    . LEVOTHYROXINE SODIUM 75 MCG PO TABS Oral Take 75 mcg by mouth daily.    Marland Kitchen METFORMIN HCL ER 500 MG PO TB24 Oral Take 500 mg by mouth 3 (three) times daily.    Marland Kitchen POLYETHYLENE GLYCOL 3350 PO PACK Oral Take 17 g by mouth daily as needed. For regularity    . POTASSIUM CHLORIDE CRYS ER 10 MEQ PO TBCR Oral Take 10-20 mEq by mouth daily. Patient to take 2 tablets daily for 1 week.  On 8-12, patient to reduce to 1 tab daily    . ROPINIROLE HCL 1 MG PO TABS Oral Take 3 mg by mouth at bedtime.     Marland Kitchen ROSUVASTATIN CALCIUM 10 MG PO TABS Oral Take 10 mg by mouth daily.    . SERTRALINE HCL 100 MG PO TABS Oral Take 100 mg by mouth daily.    Marland Kitchen SOLIFENACIN SUCCINATE 10 MG PO TABS Oral Take 10 mg by mouth daily.      . TRAMADOL HCL 50 MG PO TABS Oral Take 50 mg by mouth every 6 (six) hours as needed. For pain    . ZOLPIDEM TARTRATE 10 MG PO TABS Oral Take 10 mg by mouth at bedtime as needed. For sleep    . GLUCOSE BLOOD VI STRP  Use for daily blood glucose testing.  Diagnosis ICD-9 250.00 100 each 3  . SULFAMETHOXAZOLE-TRIMETHOPRIM 800-160 MG PO TABS Oral Take 1 tablet by mouth 2 (two) times daily. 14 tablet 0    BP 131/62  Pulse 88  Temp 98.1 F (36.7 C) (Oral)  Resp 18  SpO2 99%  LMP 01/17/2009  Physical Exam  Nursing note and vitals reviewed. Constitutional: She appears well-developed and well-nourished. No distress.  HENT:  Head: Normocephalic and atraumatic.  Neck: Normal  range of motion.  Cardiovascular: Normal rate.   Pulmonary/Chest: Effort normal.  Musculoskeletal: Normal range of motion.  Neurological: She is alert.  Skin: Skin is  warm and dry. She is not diaphoretic.       4 Sutures in place to linear laceration to the left lower extremity. Small amount of granulation tissue noted in the wound at the edges. No drainage noted. There is approximately 1.5-2 cm of surrounding mild erythema which is mildly tender to palpation. No induration.  Psychiatric: She has a normal mood and affect.    ED Course  SUTURE REMOVAL Performed by: Grant Fontana Authorized by: Grant Fontana Consent: Verbal consent obtained. Risks and benefits: risks, benefits and alternatives were discussed Body area: lower extremity Location details: left lower leg Wound Appearance: clean and pink Sutures Removed: 4 Post-removal: Steri-Strips applied, dressing applied and antibiotic ointment applied Facility: sutures placed in this facility   (including critical care time)  Labs Reviewed - No data to display No results found.   1. Visit for suture removal       MDM  Patient presents for wound check and suture removal. The wound appears to be healing well although there is a mild amount of erythema around the wound. Patient is a diabetic. Will give coverage with antibiotics for possible early cellulitis. Sutures removed, which she tolerated well. Area was dressed and covered with Steri-Strips. Reasons to return discussed.        Grant Fontana, PA-C 06/25/12 [phone]

## 2012-06-25 NOTE — ED Notes (Signed)
Pt here for suture removal from left leg placed 12 days ago.

## 2012-06-27 NOTE — ED Provider Notes (Signed)
Medical screening examination/treatment/procedure(s) were performed by non-physician practitioner and as supervising physician I was immediately available for consultation/collaboration.  Yer Olivencia, MD 06/27/12 2346 

## 2012-07-01 ENCOUNTER — Telehealth: Payer: Self-pay | Admitting: *Deleted

## 2012-07-01 NOTE — Telephone Encounter (Signed)
Agree with plan 

## 2012-07-01 NOTE — Telephone Encounter (Signed)
Pt calls and states for the last few days she has felt nausea and had some N&V, she is currently on abx for the procedure she had on her leg recently. She stated maybe she needed to eat when taking the abx, i stated one does need to eat when taking some abx, she then stated no the pharmacist said she didn't need to??? She denies fever, h/a, pain in abd, weakness. She is advised of appt 8/13 at 1045 dr li per chilonb. She is also asked to go to ED or urg care if she becomes worse or feels she cannot wait

## 2012-07-02 ENCOUNTER — Ambulatory Visit (INDEPENDENT_AMBULATORY_CARE_PROVIDER_SITE_OTHER): Payer: Medicare Other | Admitting: Internal Medicine

## 2012-07-02 VITALS — BP 110/66 | HR 78 | Temp 97.8°F | Ht 62.0 in | Wt 223.9 lb

## 2012-07-02 DIAGNOSIS — K1370 Unspecified lesions of oral mucosa: Secondary | ICD-10-CM

## 2012-07-02 DIAGNOSIS — K1379 Other lesions of oral mucosa: Secondary | ICD-10-CM

## 2012-07-02 DIAGNOSIS — S81809A Unspecified open wound, unspecified lower leg, initial encounter: Secondary | ICD-10-CM

## 2012-07-02 DIAGNOSIS — S81812A Laceration without foreign body, left lower leg, initial encounter: Secondary | ICD-10-CM

## 2012-07-02 DIAGNOSIS — E119 Type 2 diabetes mellitus without complications: Secondary | ICD-10-CM

## 2012-07-02 DIAGNOSIS — R109 Unspecified abdominal pain: Secondary | ICD-10-CM | POA: Insufficient documentation

## 2012-07-02 LAB — GLUCOSE, CAPILLARY: Glucose-Capillary: 196 mg/dL — ABNORMAL HIGH (ref 70–99)

## 2012-07-02 MED ORDER — MAGIC MOUTHWASH: 5.0000 mL | Freq: Four times a day (QID) | ORAL | Status: DC

## 2012-07-02 MED ORDER — METOCLOPRAMIDE HCL 5 MG PO TABS: 5.0000 mg | ORAL_TABLET | Freq: Three times a day (TID) | ORAL | Status: DC | PRN

## 2012-07-02 MED ORDER — ONDANSETRON HCL 4 MG PO TABS: 4.0000 mg | ORAL_TABLET | Freq: Three times a day (TID) | ORAL | Status: DC | PRN

## 2012-07-02 NOTE — Progress Notes (Signed)
**Note Mary-Identified via Obfuscation** Patient ID: Mary Gray, female   DOB: 1961/08/06, 51 y.o.   MRN: [id number]  Subjective:   Patient ID: Mary Gray female   DOB: 1961/07/14 51 y.o.   MRN: [id number]  HPI: Mary Gray is a 51 y.o. woman with PMH of DM, HTN, HLD, Hypothyroidism, Obesity and recent ED treatment for left lower leg laceration with sutures removal on 06/25/12 and ABX Septra DS x 7 days for ? early cellulitis, who is here for follow up visit.   Per chart review, she was put on sterile strips on her left lower leg wound after her sutures removal. She reports Only scant yellowish drainage noted. Denies fever, chills or night sweats.   She also reports bilateral corners of her mouth and tong soreness. Reports burning sensation inside of her mouth when she chew foods, accompanied with nausea and vomiting x 2 yesterday. Endorses chronic nausea. Denies dysphagia or pain with swallowing.    Patient also reports that she hit her anterior abdomen area on shopping cart's handle 3 days ago. She would like to have it evaluated  Review of Systems:  Constitutional:  Denies fever, chills, diaphoresis, appetite change and fatigue.   HEENT:  Denies congestion, sore throat, rhinorrhea, sneezing,  trouble swallowing, neck pain. Positive for mouth soreness.  Respiratory:  Denies SOB, DOE, cough, and wheezing.   Cardiovascular:  Denies palpitations and leg swelling.   Gastrointestinal:  Positive nausea, vomiting, denies abdominal pain, diarrhea, constipation, blood in stool and abdominal distention.   Genitourinary:  Denies dysuria, urgency, frequency, hematuria, flank pain and difficulty urinating.   Musculoskeletal:  Denies myalgias, back pain, joint swelling, arthralgias and gait problem.   Skin:  Denies pallor, rash and wound.   Neurological:  Denies dizziness, seizures, syncope, weakness, light-headedness, numbness and headaches.    .    Past Medical History  Diagnosis Date  . Diabetes mellitus   . Hypertension   .  Unspecified asthma   . Anxiety state, unspecified   . Hypercholesteremia   . Hypothyroidism   . Obesity   . GERD (gastroesophageal reflux disease)   . Iron deficiency anemia   . DDD (degenerative disc disease), lumbar   . Spinal stenosis   . Left knee pain     Following fall 11/15/2009.  Marland Kitchen Acne rosacea   . Heme positive stool     EGD on 04/08/2010 by Dr. Ewing Schlein showed chronic gastritis and a few gastric polyps; pathology showed a fundic gland polyp.  Colonoscopy on 04/08/2010 showed small external and internal hemorrhoids, and a few benign-appearing diminutive polyps in the rectum, the distal sigmoid colon, and in the distal descending colon; pathology showed hyperplastic polyps.   . Irregular menses   . Vagina bleeding   . Atrophic vaginitis   . Viral warts, unspecified     Right hand.  . Chronic interstitial cystitis     Followed by Dr. Marcine Matar  . Degeneration of cervical intervertebral disc   . Elevated transaminase level     Mild elevation, AST=48, ALT=51 on 02/01/07.  . Allergic rhinitis   . Hematochezia   . Low back pain   . Carpal tunnel syndrome    Current Outpatient Prescriptions  Medication Sig Dispense Refill  . albuterol (PROVENTIL HFA;VENTOLIN HFA) 108 (90 BASE) MCG/ACT inhaler Inhale 2 puffs into the lungs every 6 (six) hours as needed. For shortness of breath      . ALPRAZolam (XANAX) 1 MG tablet Take 1 mg by mouth 4 (four) times  daily as needed. For anxiety      . aspirin 81 MG chewable tablet Chew 81 mg by mouth daily.       . carisoprodol (SOMA) 350 MG tablet Take 350 mg by mouth 4 (four) times daily as needed. For pain      . docusate sodium (COLACE) 100 MG capsule Take 100 mg by mouth 2 (two) times daily.       . enalapril (VASOTEC) 5 MG tablet Take 1 tablet (5 mg total) by mouth daily.  30 tablet  6  . erythromycin ethylsuccinate (EES) 400 MG tablet Take 400 mg by mouth 2 (two) times daily.      Marland Kitchen esomeprazole (NEXIUM) 40 MG capsule Take 40 mg by  mouth daily before breakfast.      . fluticasone (FLONASE) 50 MCG/ACT nasal spray Place 2 sprays into the nose daily as needed. For allergies      . fluticasone (FLOVENT DISKUS) 50 MCG/BLIST diskus inhaler Inhale 1 puff into the lungs 2 (two) times daily.      . furosemide (LASIX) 20 MG tablet Take 1 tablet (20 mg total) by mouth daily.  30 tablet  3  . glucose blood test strip Use for daily blood glucose testing.  Diagnosis ICD-9 250.00  100 each  3  . HYDROmorphone (DILAUDID) 2 MG tablet Take 1 tablet (2 mg total) by mouth every 6 (six) hours as needed for pain.  120 tablet  0  . ipratropium (ATROVENT HFA) 17 MCG/ACT inhaler Inhale 2 puffs into the lungs 4 (four) times daily as needed. For wheezing      . levothyroxine (SYNTHROID) 75 MCG tablet Take 75 mcg by mouth daily.      . metFORMIN (GLUCOPHAGE-XR) 500 MG 24 hr tablet Take 500 mg by mouth 3 (three) times daily.      . polyethylene glycol (MIRALAX / GLYCOLAX) packet Take 17 g by mouth daily as needed. For regularity      . potassium chloride (K-DUR,KLOR-CON) 10 MEQ tablet Take 10-20 mEq by mouth daily. Patient to take 2 tablets daily for 1 week.  On 8-12, patient to reduce to 1 tab daily      . rOPINIRole (REQUIP) 1 MG tablet Take 3 mg by mouth at bedtime.       . rosuvastatin (CRESTOR) 10 MG tablet Take 10 mg by mouth daily.      . sertraline (ZOLOFT) 100 MG tablet Take 100 mg by mouth daily.      . solifenacin (VESICARE) 10 MG tablet Take 10 mg by mouth daily.        Marland Kitchen sulfamethoxazole-trimethoprim (SEPTRA DS) 800-160 MG per tablet Take 1 tablet by mouth 2 (two) times daily.  14 tablet  0  . traMADol (ULTRAM) 50 MG tablet Take 50 mg by mouth every 6 (six) hours as needed. For pain      . zolpidem (AMBIEN) 10 MG tablet Take 10 mg by mouth at bedtime as needed. For sleep       Family History  Problem Relation Age of Onset  . Heart attack Father 70  . Breast cancer Neg Hx   . Colon cancer Neg Hx   . Diabetes Mother   . Hypertension  Mother    History   Social History  . Marital Status: Single    Spouse Name: N/A    Number of Children: N/A  . Years of Education: N/A   Social History Main Topics  . Smoking status: Former Smoker --  1.0 packs/day for 15 years    Types: Cigarettes    Quit date: 01/18/1996  . Smokeless tobacco: Never Used  . Alcohol Use: No  . Drug Use: Not on file  . Sexually Active: Not on file   Other Topics Concern  . Not on file   Social History Narrative  . No narrative on file   Review of Systems: See HPI  Objective:  Physical Exam: Filed Vitals:   07/02/12 1122  BP: 110/66  Pulse: 78  Temp: 97.8 F (36.6 C)  TempSrc: Oral  Height: 5\' 2"  (1.575 m)  Weight: 223 lb 14.4 oz (101.56 kg)  SpO2: 94%   General: alert, well-developed, and cooperative to examination.  Head: normocephalic and atraumatic.  Eyes: vision grossly intact, pupils equal, pupils round, pupils reactive to light, no injection and anicteric.  Mouth: pharynx pink and moist, no erythema, and no exudates. Red "beef like" tongue. Bilateral corners of her mouth some sores noted. No blisters.  Neck: supple, full ROM, no thyromegaly, no JVD, and no carotid bruits.  Lungs: normal respiratory effort, no accessory muscle use, normal breath sounds, no crackles, and no wheezes. Heart: normal rate, regular rhythm, no murmur, no gallop, and no rub.  Abdomen: soft,  Tenderness with palpation but non tenderness noted with stethoscope pressing down, normal bowel sounds, no distention, no guarding, no rebound tenderness, no hepatomegaly, and no splenomegaly.  Msk: no joint swelling, no joint warmth, and no redness over joints.  Pulses: 2+ DP/PT pulses bilaterally Extremities: No cyanosis, clubbing, edema Neurologic: alert & oriented X3, cranial nerves II-XII intact, strength normal in all extremities, sensation intact to light touch, and gait normal.  Skin: turgor normal and no rashes.  Psych: Oriented X3, memory intact for  recent and remote, normally interactive, good eye contact, not anxious appearing, and not depressed appearing.   Assessment & Plan:

## 2012-07-02 NOTE — Assessment & Plan Note (Signed)
Patient  reports that she hit her anterior abdomen area on shopping cart's handle 3 days ago. She states that she still have abdominal pain accompanied with Nausea and vomiting x two times yesterday. Denies diarrhea or constipation. denies fever, dizziness  - she is hemodynamically stable.  - Tenderness with palpation but non-tenderness noted with stethoscope pressing down.  - will treat her symptomatically -  Zofran PRN for nausea.

## 2012-07-02 NOTE — Assessment & Plan Note (Signed)
Likely associated with her recent ABX treatment   - patient is given Magic mouth wash PRN

## 2012-07-02 NOTE — Patient Instructions (Addendum)
1. Follow up with the clinic for a nurse visit for left leg wound check 2. Follow up in 3 months 3. Will give your Magic mouth wash for your mouth sore.

## 2012-07-02 NOTE — Assessment & Plan Note (Signed)
Left LE wound healing well. No erythema, swelling, warmth to touch. Mild tenderness with palpation. No drainage noted. Only scan dry yellowish drainage noted on dressing. Denies fever, chills or night sweat. She reports that she has finished all of her Septra DS.  - no s/s localized infection or cellulitis on wound - instruct patient not to cover the wound as much as possible.  - instruct her to come back to the clinic for nurse wound check in one week. Patient is instructed to go to ED if she experience fever/chill, infection of wound ( redness, swelling, pus, warmth and tenderness)

## 2012-07-03 ENCOUNTER — Encounter (HOSPITAL_COMMUNITY): Payer: Self-pay | Admitting: *Deleted

## 2012-07-03 ENCOUNTER — Emergency Department (HOSPITAL_COMMUNITY)
Admission: EM | Admit: 2012-07-03 | Discharge: 2012-07-03 | Disposition: A | Discharge status: Home / Self Care | Payer: Medicare HMO | Attending: Emergency Medicine | Admitting: Emergency Medicine

## 2012-07-03 DIAGNOSIS — Z8249 Family history of ischemic heart disease and other diseases of the circulatory system: Secondary | ICD-10-CM | POA: Insufficient documentation

## 2012-07-03 DIAGNOSIS — M5137 Other intervertebral disc degeneration, lumbosacral region: Secondary | ICD-10-CM | POA: Insufficient documentation

## 2012-07-03 DIAGNOSIS — M25569 Pain in unspecified knee: Secondary | ICD-10-CM | POA: Insufficient documentation

## 2012-07-03 DIAGNOSIS — Z5189 Encounter for other specified aftercare: Secondary | ICD-10-CM

## 2012-07-03 DIAGNOSIS — K219 Gastro-esophageal reflux disease without esophagitis: Secondary | ICD-10-CM | POA: Insufficient documentation

## 2012-07-03 DIAGNOSIS — I10 Essential (primary) hypertension: Secondary | ICD-10-CM | POA: Insufficient documentation

## 2012-07-03 DIAGNOSIS — Z803 Family history of malignant neoplasm of breast: Secondary | ICD-10-CM | POA: Insufficient documentation

## 2012-07-03 DIAGNOSIS — E7800 Pure hypercholesterolemia, unspecified: Secondary | ICD-10-CM | POA: Insufficient documentation

## 2012-07-03 DIAGNOSIS — Z888 Allergy status to other drugs, medicaments and biological substances status: Secondary | ICD-10-CM | POA: Insufficient documentation

## 2012-07-03 DIAGNOSIS — Z87891 Personal history of nicotine dependence: Secondary | ICD-10-CM | POA: Insufficient documentation

## 2012-07-03 DIAGNOSIS — Z800 Family history of malignant neoplasm of digestive organs: Secondary | ICD-10-CM | POA: Insufficient documentation

## 2012-07-03 DIAGNOSIS — M79609 Pain in unspecified limb: Secondary | ICD-10-CM | POA: Insufficient documentation

## 2012-07-03 DIAGNOSIS — E669 Obesity, unspecified: Secondary | ICD-10-CM | POA: Insufficient documentation

## 2012-07-03 DIAGNOSIS — E119 Type 2 diabetes mellitus without complications: Secondary | ICD-10-CM | POA: Insufficient documentation

## 2012-07-03 DIAGNOSIS — J45909 Unspecified asthma, uncomplicated: Secondary | ICD-10-CM | POA: Insufficient documentation

## 2012-07-03 DIAGNOSIS — E039 Hypothyroidism, unspecified: Secondary | ICD-10-CM | POA: Insufficient documentation

## 2012-07-03 DIAGNOSIS — M51379 Other intervertebral disc degeneration, lumbosacral region without mention of lumbar back pain or lower extremity pain: Secondary | ICD-10-CM | POA: Insufficient documentation

## 2012-07-03 DIAGNOSIS — Z885 Allergy status to narcotic agent status: Secondary | ICD-10-CM | POA: Insufficient documentation

## 2012-07-03 DIAGNOSIS — D509 Iron deficiency anemia, unspecified: Secondary | ICD-10-CM | POA: Insufficient documentation

## 2012-07-03 DIAGNOSIS — Z833 Family history of diabetes mellitus: Secondary | ICD-10-CM | POA: Insufficient documentation

## 2012-07-03 DIAGNOSIS — Z880 Allergy status to penicillin: Secondary | ICD-10-CM | POA: Insufficient documentation

## 2012-07-03 DIAGNOSIS — Z7982 Long term (current) use of aspirin: Secondary | ICD-10-CM | POA: Insufficient documentation

## 2012-07-03 MED ORDER — IBUPROFEN 200 MG PO TABS
600.0000 mg | ORAL_TABLET | Freq: Once | ORAL | Status: AC
  Administered 2012-07-03: 600 mg via ORAL
  Filled 2012-07-03: qty 1

## 2012-07-03 MED ORDER — OXYCODONE-ACETAMINOPHEN 5-325 MG PO TABS: ORAL_TABLET | ORAL | Status: AC

## 2012-07-03 NOTE — ED Provider Notes (Signed)
History   This chart was scribed for [name]. [name], MD by Charolett Bumpers . The patient was seen in room TR08C/TR08C. Patient's care was started at 1118.    CSN: [id number]  Arrival date & time 07/03/12  1308   First MD Initiated Contact with Patient 07/03/12 1118      Chief Complaint  Patient presents with  . Leg Pain  . Wound Check    (Consider location/radiation/quality/duration/timing/severity/associated sxs/prior treatment) HPI Mary Gray is a 51 y.o. female who has a h/o diabetes presents to the Emergency Department complaining of constant, mild left lower leg pain with an associated laceration that occurred on 06/11/12. Pt reports associated mild redness and clear yellow drainage. Pt states that her wound is not healing. Pt was seen here in ED previously and placed on an abx. Pt is able to ambulate with walker. Pt reports that she has taken Advil with some relief. However she is complaining of pain, it did have slight discharge and a little bleeding once after walking a lot.  She denies CP, SOB, calf tenderness.  Just pain around the cut and directly behind it.   Past Medical History  Diagnosis Date  . Diabetes mellitus   . Hypertension   . Unspecified asthma   . Anxiety state, unspecified   . Hypercholesteremia   . Hypothyroidism   . Obesity   . GERD (gastroesophageal reflux disease)   . Iron deficiency anemia   . DDD (degenerative disc disease), lumbar   . Spinal stenosis   . Left knee pain     Following fall 11/15/2009.  Marland Kitchen Acne rosacea   . Heme positive stool     EGD on 04/08/2010 by Dr. Ewing Schlein showed chronic gastritis and a few gastric polyps; pathology showed a fundic gland polyp.  Colonoscopy on 04/08/2010 showed small external and internal hemorrhoids, and a few benign-appearing diminutive polyps in the rectum, the distal sigmoid colon, and in the distal descending colon; pathology showed hyperplastic polyps.   . Irregular menses   . Vagina bleeding     . Atrophic vaginitis   . Viral warts, unspecified     Right hand.  . Chronic interstitial cystitis     Followed by Dr. Marcine Matar  . Degeneration of cervical intervertebral disc   . Elevated transaminase level     Mild elevation, AST=48, ALT=51 on 02/01/07.  . Allergic rhinitis   . Hematochezia   . Low back pain   . Carpal tunnel syndrome     Past Surgical History  Procedure Date  . Carpal tunnel release 05/08/2000    By Dr. Katy Fitch. Sypher, Montez Hageman.    Family History  Problem Relation Age of Onset  . Heart attack Father 76  . Breast cancer Neg Hx   . Colon cancer Neg Hx   . Diabetes Mother   . Hypertension Mother     History  Substance Use Topics  . Smoking status: Former Smoker -- 1.0 packs/day for 15 years    Types: Cigarettes    Quit date: 01/18/1996  . Smokeless tobacco: Never Used  . Alcohol Use: No    OB History    Grav Para Term Preterm Abortions TAB SAB Ect Mult Living                  Review of Systems  Constitutional: Negative for fever and chills.  Respiratory: Negative for chest tightness and shortness of breath.   Cardiovascular: Negative for chest pain and leg  swelling.  Gastrointestinal: Negative for nausea and vomiting.  Skin: Positive for wound. Negative for color change and rash.  Neurological: Negative for weakness and numbness.    Allergies  Codeine sulfate; Meperidine and related; Minocycline hcl; Penicillins; Propoxyphene-acetaminophen; Tetracycline hcl; and Vicodin  Home Medications   Current Outpatient Rx  Name Route Sig Dispense Refill  . ALBUTEROL SULFATE HFA 108 (90 BASE) MCG/ACT IN AERS Inhalation Inhale 2 puffs into the lungs every 6 (six) hours as needed. For shortness of breath    . ALPRAZOLAM 1 MG PO TABS Oral Take 1 mg by mouth 4 (four) times daily as needed. For anxiety    . MAGIC MOUTHWASH Oral Take 5 mLs by mouth 4 (four) times daily. Starting 07/02/12 for 7 days    . ASPIRIN 81 MG PO CHEW Oral Chew 81 mg by mouth  daily.     Marland Kitchen CARISOPRODOL 350 MG PO TABS Oral Take 350 mg by mouth 4 (four) times daily as needed. For pain    . DOCUSATE SODIUM 100 MG PO CAPS Oral Take 100 mg by mouth 2 (two) times daily.     . ENALAPRIL MALEATE 5 MG PO TABS Oral Take 1 tablet (5 mg total) by mouth daily. 30 tablet 6  . ERYTHROMYCIN ETHYLSUCCINATE 400 MG PO TABS Oral Take 400 mg by mouth 2 (two) times daily.    Marland Kitchen ESOMEPRAZOLE MAGNESIUM 40 MG PO CPDR Oral Take 40 mg by mouth daily before breakfast.    . FLUTICASONE PROPIONATE 50 MCG/ACT NA SUSP Nasal Place 2 sprays into the nose daily as needed. For allergies    . FLUTICASONE PROPIONATE (INHAL) 50 MCG/BLIST IN AEPB Inhalation Inhale 1 puff into the lungs 2 (two) times daily.    . FUROSEMIDE 20 MG PO TABS Oral Take 1 tablet (20 mg total) by mouth daily. 30 tablet 3  . HYDROMORPHONE HCL 2 MG PO TABS Oral Take 1 tablet (2 mg total) by mouth every 6 (six) hours as needed for pain. 120 tablet 0  . IPRATROPIUM BROMIDE HFA 17 MCG/ACT IN AERS Inhalation Inhale 2 puffs into the lungs 4 (four) times daily as needed. For wheezing    . LEVOTHYROXINE SODIUM 75 MCG PO TABS Oral Take 75 mcg by mouth daily.    Marland Kitchen METFORMIN HCL ER 500 MG PO TB24 Oral Take 500 mg by mouth 3 (three) times daily.    Marland Kitchen ONDANSETRON HCL 4 MG PO TABS Oral Take 1 tablet (4 mg total) by mouth every 8 (eight) hours as needed for nausea. 15 tablet 0  . POLYETHYLENE GLYCOL 3350 PO PACK Oral Take 17 g by mouth daily as needed. For regularity    . POTASSIUM CHLORIDE CRYS ER 10 MEQ PO TBCR Oral Take 10-20 mEq by mouth daily. Patient to take 2 tablets daily for 1 week.  On 8-12, patient to reduce to 1 tab daily    . ROPINIROLE HCL 3 MG PO TABS Oral Take 3 mg by mouth at bedtime.    Marland Kitchen ROSUVASTATIN CALCIUM 10 MG PO TABS Oral Take 10 mg by mouth daily.    . SERTRALINE HCL 100 MG PO TABS Oral Take 100 mg by mouth daily.    Marland Kitchen SOLIFENACIN SUCCINATE 10 MG PO TABS Oral Take 10 mg by mouth daily.      . TRAMADOL HCL 50 MG PO TABS Oral  Take 50 mg by mouth every 6 (six) hours as needed. For pain    . ZOLPIDEM TARTRATE 10 MG PO TABS  Oral Take 10 mg by mouth at bedtime as needed. For sleep    . GLUCOSE BLOOD VI STRP  Use for daily blood glucose testing.  Diagnosis ICD-9 250.00 100 each 3  . SULFAMETHOXAZOLE-TRIMETHOPRIM 800-160 MG PO TABS Oral Take 1 tablet by mouth 2 (two) times daily. Therapy completed 07/02/13      BP 149/70  Pulse 86  Temp 98 F (36.7 C) (Oral)  Resp 16  SpO2 98%  LMP 01/17/2009  Physical Exam  Nursing note and vitals reviewed. Constitutional: She is oriented to person, place, and time. She appears well-developed and well-nourished. No distress.  HENT:  Head: Normocephalic and atraumatic.  Eyes: EOM are normal.  Neck: Neck supple. No tracheal deviation present.  Cardiovascular: Normal rate.   Pulmonary/Chest: Effort normal. No respiratory distress.  Musculoskeletal: Normal range of motion.       No left calf tenderness. Local tenderness  at distal lower leg posteriorly behind wound on anterior left lower leg.  However no calf tenderness, neg Homan's.  Minimal erythema surround wound, no bleeding or discharge.   Neurological: She is alert and oriented to person, place, and time.  Skin: Skin is warm and dry. No rash noted. She is not diaphoretic. No pallor.  Psychiatric: She has a normal mood and affect. Her behavior is normal.    ED Course  Procedures (including critical care time)  DIAGNOSTIC STUDIES: Oxygen Saturation is 98% on room air, normal by my interpretation.    COORDINATION OF CARE:  11:32-Discussed planned course of treatment with the patient, who is agreeable at this time. Discussed strict return precautions.      Labs Reviewed - No data to display No results found.   1. Wound check, abscess       MDM  I personally performed the services described in this documentation, which was scribed in my presence. The recorded information has been reviewed and  considered.   Minimal erythema, does not appears to be cellulitis.  Analgesic provided and pt can continue wound care at home and follow up with PCP      [name]. Darcell Yacoub, MD 07/05/12 1556

## 2012-07-03 NOTE — ED Notes (Signed)
Cleaned wound with wound cleanser and applied bacitracin with non stick gauze patient tolerated without incident.

## 2012-07-03 NOTE — Discharge Instructions (Signed)
Narcotic and benzodiazepine use may cause drowsiness, slowed breathing or dependence.  Please use with caution and do not drive, operate machinery or watch young children alone while taking them.  Taking combinations of these medications or drinking alcohol will potentiate these effects.    

## 2012-07-03 NOTE — ED Notes (Signed)
Pt was here for laceration to left lower leg on 7/23, reports that it isn't healing and still having pain, mild redness noted.

## 2012-07-04 ENCOUNTER — Other Ambulatory Visit: Payer: Self-pay | Admitting: *Deleted

## 2012-07-04 ENCOUNTER — Ambulatory Visit (INDEPENDENT_AMBULATORY_CARE_PROVIDER_SITE_OTHER): Payer: Medicare HMO | Admitting: Internal Medicine

## 2012-07-04 ENCOUNTER — Encounter: Payer: Self-pay | Admitting: Internal Medicine

## 2012-07-04 VITALS — BP 129/70 | HR 97 | Temp 98.7°F | Ht 61.0 in | Wt 218.1 lb

## 2012-07-04 DIAGNOSIS — H6091 Unspecified otitis externa, right ear: Secondary | ICD-10-CM | POA: Insufficient documentation

## 2012-07-04 DIAGNOSIS — H60399 Other infective otitis externa, unspecified ear: Secondary | ICD-10-CM

## 2012-07-04 DIAGNOSIS — S81809A Unspecified open wound, unspecified lower leg, initial encounter: Secondary | ICD-10-CM

## 2012-07-04 DIAGNOSIS — X58XXXA Exposure to other specified factors, initial encounter: Secondary | ICD-10-CM

## 2012-07-04 DIAGNOSIS — R109 Unspecified abdominal pain: Secondary | ICD-10-CM

## 2012-07-04 DIAGNOSIS — E119 Type 2 diabetes mellitus without complications: Secondary | ICD-10-CM

## 2012-07-04 DIAGNOSIS — S81812A Laceration without foreign body, left lower leg, initial encounter: Secondary | ICD-10-CM

## 2012-07-04 LAB — GLUCOSE, CAPILLARY: Glucose-Capillary: 137 mg/dL — ABNORMAL HIGH (ref 70–99)

## 2012-07-04 MED ORDER — OFLOXACIN 0.3 % OT SOLN: 10.0000 [drp] | Freq: Every day | OTIC | Status: DC

## 2012-07-04 NOTE — Assessment & Plan Note (Addendum)
Similar complains comparing to 2 days ago. No appetite or bowel movement abnormality. No abdominal swelling, erythema, bruising, hematoma, warmth to touch noted. Tenderness not consistent between manual palpation vs stethoscope palpation. BS active x 4  - reassurance - instruct patient to call back if her pain is worsening or bloody stools noted.

## 2012-07-04 NOTE — Patient Instructions (Addendum)
1. Call back in one week to report whether you feel better.   Otitis Externa Otitis externa ("swimmer's ear") is a germ (bacterial) or fungal infection of the outer ear canal (from the eardrum to the outside of the ear). Swimming in dirty water may cause swimmer's ear. It also may be caused by moisture in the ear from water remaining after swimming or bathing. Often the first signs of infection may be itching in the ear canal. This may progress to ear canal swelling, redness, and pus drainage, which may be signs of infection. HOME CARE INSTRUCTIONS   Apply the antibiotic drops to the ear canal as prescribed by your doctor.   This can be a very painful medical condition. A strong pain reliever may be prescribed.   Only take over-the-counter or prescription medicines for pain, discomfort, or fever as directed by your caregiver.   If your caregiver has given you a follow-up appointment, it is very important to keep that appointment. Not keeping the appointment could result in a chronic or permanent injury, pain, hearing loss and disability. If there is any problem keeping the appointment, you must call back to this facility for assistance.  PREVENTION   It is important to keep your ear dry. Use the corner of a towel to wick water out of the ear canal after swimming or bathing.   Avoid scratching in your ear. This can damage the ear canal or remove the protective wax lining the canal and make it easier for germs (bacteria) or a fungus to grow.   You may use ear drops made of rubbing alcohol and vinegar after swimming to prevent future "swimmer's ear" infections. Make up a small bottle of equal parts white vinegar and alcohol. Put 3 or 4 drops into each ear after swimming.   Avoid swimming in lakes, polluted water, or poorly chlorinated pools.  SEEK MEDICAL CARE IF:   An oral temperature above 102 F (38.9 C) develops.   Your ear is still painful after 3 days and shows signs of getting worse  (redness, swelling, pain, or pus).  MAKE SURE YOU:   Understand these instructions.   Will watch your condition.   Will get help right away if you are not doing well or get worse.  Document Released: 11/06/2005 Document Revised: 10/26/2011 Document Reviewed: 06/12/2008 [facility] Patient Information 2012 [place], [place].

## 2012-07-04 NOTE — Assessment & Plan Note (Signed)
The clinical manifestation is consistent with the Dx. No s/s of malignant otitis externa. Patient feels ok otherwise.  - will give her Ofloxacin otic drops.  - patient is instructed to call the clinic in one week if not better.

## 2012-07-04 NOTE — Progress Notes (Signed)
Patient ID: Mary Gray, female   DOB: 12-30-1960, 51 y.o.   MRN: [id number]  Subjective:   Patient ID: Mary Gray female   DOB: 04/13/1961 51 y.o.   MRN: [id number]  HPI: [name] is a 51 y.o. woman with PMH of DM, HTN, HLD, Hypothyroidism, Obesity and recent ED treatment for left lower leg laceration with sutures removal on 06/25/12 and ABX Septra DS x 7 days for ? early cellulitis, who is here for right ear discharge.  Patient reports right ear pain and discharge x 2 days. Denies fever, chills or night sweats. Denies cold like symptoms. Denies immerse her head under the water, using spas or swimming. States that she only got water into her ear.  Admits using Q tips for her ear wax. She states that she has had history of ear infection in the past which was treated with Ear drops ABX.   Review of Systems:  Constitutional:  Denies fever, chills, diaphoresis, appetite change and fatigue.   HEENT:  Denies congestion, sore throat, rhinorrhea, sneezing,  trouble swallowing, neck pain. Positive right ear discharge  Respiratory:  Denies SOB, DOE, cough, and wheezing.   Cardiovascular:  Denies palpitations and leg swelling.   Gastrointestinal:  Denies nausea, vomiting, denies abdominal pain, diarrhea, constipation, blood in stool and abdominal distention.   Genitourinary:  Denies dysuria, urgency, frequency, hematuria, flank pain and difficulty urinating.   Musculoskeletal:  Denies myalgias, back pain, joint swelling, arthralgias and gait problem.   Skin:  Denies pallor, rash and wound.   Neurological:  Denies dizziness, seizures, syncope, weakness, light-headedness, numbness and headaches.    .    Past Medical History  Diagnosis Date  . Diabetes mellitus   . Hypertension   . Unspecified asthma   . Anxiety state, unspecified   . Hypercholesteremia   . Hypothyroidism   . Obesity   . GERD (gastroesophageal reflux disease)   . Iron deficiency anemia   . DDD (degenerative disc  disease), lumbar   . Spinal stenosis   . Left knee pain     Following fall 11/15/2009.  Marland Kitchen Acne rosacea   . Heme positive stool     EGD on 04/08/2010 by Dr. Ewing Schlein showed chronic gastritis and a few gastric polyps; pathology showed a fundic gland polyp.  Colonoscopy on 04/08/2010 showed small external and internal hemorrhoids, and a few benign-appearing diminutive polyps in the rectum, the distal sigmoid colon, and in the distal descending colon; pathology showed hyperplastic polyps.   . Irregular menses   . Vagina bleeding   . Atrophic vaginitis   . Viral warts, unspecified     Right hand.  . Chronic interstitial cystitis     Followed by Dr. Marcine Matar  . Degeneration of cervical intervertebral disc   . Elevated transaminase level     Mild elevation, AST=48, ALT=51 on 02/01/07.  . Allergic rhinitis   . Hematochezia   . Low back pain   . Carpal tunnel syndrome    Current Outpatient Prescriptions  Medication Sig Dispense Refill  . albuterol (PROVENTIL HFA;VENTOLIN HFA) 108 (90 BASE) MCG/ACT inhaler Inhale 2 puffs into the lungs every 6 (six) hours as needed. For shortness of breath      . ALPRAZolam (XANAX) 1 MG tablet Take 1 mg by mouth 4 (four) times daily as needed. For anxiety      . Alum & Mag Hydroxide-Simeth (MAGIC MOUTHWASH) SOLN Take 5 mLs by mouth 4 (four) times daily. Starting 07/02/12 for 7  days      . aspirin 81 MG chewable tablet Chew 81 mg by mouth daily.       . carisoprodol (SOMA) 350 MG tablet Take 350 mg by mouth 4 (four) times daily as needed. For pain      . docusate sodium (COLACE) 100 MG capsule Take 100 mg by mouth 2 (two) times daily.       . enalapril (VASOTEC) 5 MG tablet Take 1 tablet (5 mg total) by mouth daily.  30 tablet  6  . erythromycin ethylsuccinate (EES) 400 MG tablet Take 400 mg by mouth 2 (two) times daily.      Marland Kitchen esomeprazole (NEXIUM) 40 MG capsule Take 40 mg by mouth daily before breakfast.      . fluticasone (FLONASE) 50 MCG/ACT nasal spray  Place 2 sprays into the nose daily as needed. For allergies      . fluticasone (FLOVENT DISKUS) 50 MCG/BLIST diskus inhaler Inhale 1 puff into the lungs 2 (two) times daily.      . furosemide (LASIX) 20 MG tablet Take 1 tablet (20 mg total) by mouth daily.  30 tablet  3  . glucose blood test strip Use for daily blood glucose testing.  Diagnosis ICD-9 250.00  100 each  3  . HYDROmorphone (DILAUDID) 2 MG tablet Take 1 tablet (2 mg total) by mouth every 6 (six) hours as needed for pain.  120 tablet  0  . ipratropium (ATROVENT HFA) 17 MCG/ACT inhaler Inhale 2 puffs into the lungs 4 (four) times daily as needed. For wheezing      . levothyroxine (SYNTHROID) 75 MCG tablet Take 75 mcg by mouth daily.      . metFORMIN (GLUCOPHAGE-XR) 500 MG 24 hr tablet Take 500 mg by mouth 3 (three) times daily.      . ondansetron (ZOFRAN) 4 MG tablet Take 1 tablet (4 mg total) by mouth every 8 (eight) hours as needed for nausea.  15 tablet  0  . oxyCODONE-acetaminophen (PERCOCET/ROXICET) 5-325 MG per tablet 1-2 tablets po q 6 hours prn moderate to severe pain  15 tablet  0  . polyethylene glycol (MIRALAX / GLYCOLAX) packet Take 17 g by mouth daily as needed. For regularity      . potassium chloride (K-DUR,KLOR-CON) 10 MEQ tablet Take 10-20 mEq by mouth daily. Patient to take 2 tablets daily for 1 week.  On 8-12, patient to reduce to 1 tab daily      . rOPINIRole (REQUIP) 3 MG tablet Take 3 mg by mouth at bedtime.      . rosuvastatin (CRESTOR) 10 MG tablet Take 10 mg by mouth daily.      . sertraline (ZOLOFT) 100 MG tablet Take 100 mg by mouth daily.      . solifenacin (VESICARE) 10 MG tablet Take 10 mg by mouth daily.        Marland Kitchen sulfamethoxazole-trimethoprim (BACTRIM DS,SEPTRA DS) 800-160 MG per tablet Take 1 tablet by mouth 2 (two) times daily. Therapy completed 07/02/13      . traMADol (ULTRAM) 50 MG tablet Take 50 mg by mouth every 6 (six) hours as needed. For pain      . zolpidem (AMBIEN) 10 MG tablet Take 10 mg by  mouth at bedtime as needed. For sleep      . ofloxacin (FLOXIN OTIC) 0.3 % otic solution Place 10 drops into the right ear daily.  5 mL  0   Family History  Problem Relation Age of Onset  .  Heart attack Father 73  . Breast cancer Neg Hx   . Colon cancer Neg Hx   . Diabetes Mother   . Hypertension Mother    History   Social History  . Marital Status: Single    Spouse Name: N/A    Number of Children: N/A  . Years of Education: N/A   Social History Main Topics  . Smoking status: Former Smoker -- 1.0 packs/day for 15 years    Types: Cigarettes    Quit date: 01/18/1996  . Smokeless tobacco: Never Used  . Alcohol Use: No  . Drug Use: None  . Sexually Active: None   Other Topics Concern  . None   Social History Narrative  . None   Review of Systems: See HPI  Objective:  Physical Exam: Filed Vitals:   07/04/12 1438  BP: 129/70  Pulse: 97  Temp: 98.7 F (37.1 C)  TempSrc: Oral  Height: 5\' 1"  (1.549 m)  Weight: 218 lb 1.6 oz (98.93 kg)   General: alert, well-developed, and cooperative to examination.  Head: normocephalic and atraumatic.  Eyes: vision grossly intact, pupils equal, pupils round, pupils reactive to light, no injection and anicteric.  Mouth: pharynx pink and moist, no erythema, and no exudates. Red "beef like" tongue. Bilateral corners of her mouth some sores noted. No blisters. Above findings better than before Ears: B/L tympanic membrane intact. Right external ear canal some cloudy whitish drainage noted. Mild tenderness around her external canal. No swelling, erythema or warmth to touch. NO tumor noted inside of canal.  Neck: supple, full ROM, no thyromegaly, no JVD, and no carotid bruits.  Lungs: normal respiratory effort, no accessory muscle use, normal breath sounds, no crackles, and no wheezes. Heart: normal rate, regular rhythm, no murmur, no gallop, and no rub.  Abdomen: soft,  Tenderness with palpation but non tenderness noted with stethoscope  pressing down, normal bowel sounds, no distention, no guarding, no rebound tenderness, no hepatomegaly, and no splenomegaly.  Msk: no joint swelling, no joint warmth, and no redness over joints.  Pulses: 2+ DP/PT pulses bilaterally Extremities: No cyanosis, clubbing, edema. Left LE healing wound noted. No localized erythema, swelling, warmth to touch and tenderness. No drainage Neurologic: alert & oriented X3, cranial nerves II-XII intact, strength normal in all extremities, sensation intact to light touch, and gait normal.  Skin: turgor normal and no rashes.  Psych: Oriented X3, memory intact for recent and remote, normally interactive, good eye contact, not anxious appearing, and not depressed appearing.   Assessment & Plan:

## 2012-07-04 NOTE — Assessment & Plan Note (Signed)
Wound healing well. Patient went to ED for wound check yesterday after she was told that her wound heals ok by me the day before during the office visit.   - No s/s infection

## 2012-07-05 ENCOUNTER — Encounter (HOSPITAL_COMMUNITY): Payer: Self-pay | Admitting: Emergency Medicine

## 2012-07-05 MED ORDER — GLUCOSE BLOOD VI STRP: ORAL_STRIP | Status: DC

## 2012-07-05 MED ORDER — ACCU-CHEK SOFTCLIX LANCET DEV MISC: Status: DC

## 2012-07-10 ENCOUNTER — Other Ambulatory Visit: Payer: Self-pay | Admitting: Internal Medicine

## 2012-07-13 ENCOUNTER — Other Ambulatory Visit: Payer: Self-pay | Admitting: Internal Medicine

## 2012-07-16 ENCOUNTER — Other Ambulatory Visit: Payer: Self-pay | Admitting: Neurosurgery

## 2012-07-23 ENCOUNTER — Other Ambulatory Visit: Payer: Self-pay | Admitting: Internal Medicine

## 2012-07-24 ENCOUNTER — Other Ambulatory Visit: Payer: Self-pay | Admitting: *Deleted

## 2012-07-24 NOTE — Telephone Encounter (Signed)
Please find out if patient is still having symptoms needing Magic mouthwash or antibiotic drops for external otitis.  If so, she should be seen in the clinic.  I refilled the otic solution, but as above if she is still symptomatic she should be reexamined.

## 2012-07-26 ENCOUNTER — Other Ambulatory Visit: Payer: Self-pay | Admitting: Internal Medicine

## 2012-07-26 MED ORDER — MAGIC MOUTHWASH: ORAL | Status: DC

## 2012-07-26 NOTE — Telephone Encounter (Signed)
Please find out if patient is still having symptoms needing Magic mouthwash.  I refilled the nystatin powder as requested.

## 2012-07-26 NOTE — Telephone Encounter (Signed)
Pt states redness, soreness and white bumps have returned.  She uses inhaler, then washes mouth.  MMW has worked in past for this.   Onset 4 days ago. Pt is waiting at the pharmacy.

## 2012-07-26 NOTE — Telephone Encounter (Signed)
Pt called yesterday and today - no answer, message left to call the clinic.

## 2012-07-31 ENCOUNTER — Ambulatory Visit (INDEPENDENT_AMBULATORY_CARE_PROVIDER_SITE_OTHER): Payer: Medicare Other | Admitting: Internal Medicine

## 2012-07-31 ENCOUNTER — Encounter: Payer: Self-pay | Admitting: Internal Medicine

## 2012-07-31 ENCOUNTER — Ambulatory Visit (HOSPITAL_COMMUNITY)
Admission: RE | Admit: 2012-07-31 | Discharge: 2012-07-31 | Disposition: A | Discharge status: Home / Self Care | Payer: Medicare HMO | Source: Ambulatory Visit | Attending: Internal Medicine | Admitting: Internal Medicine

## 2012-07-31 VITALS — BP 123/72 | HR 80 | Temp 97.3°F | Ht 61.0 in | Wt 212.9 lb

## 2012-07-31 DIAGNOSIS — E119 Type 2 diabetes mellitus without complications: Secondary | ICD-10-CM

## 2012-07-31 DIAGNOSIS — R600 Localized edema: Secondary | ICD-10-CM

## 2012-07-31 DIAGNOSIS — R0781 Pleurodynia: Secondary | ICD-10-CM

## 2012-07-31 DIAGNOSIS — R079 Chest pain, unspecified: Secondary | ICD-10-CM

## 2012-07-31 DIAGNOSIS — Z01818 Encounter for other preprocedural examination: Secondary | ICD-10-CM

## 2012-07-31 DIAGNOSIS — Z23 Encounter for immunization: Secondary | ICD-10-CM

## 2012-07-31 DIAGNOSIS — M5440 Lumbago with sciatica, unspecified side: Secondary | ICD-10-CM

## 2012-07-31 DIAGNOSIS — I10 Essential (primary) hypertension: Secondary | ICD-10-CM

## 2012-07-31 DIAGNOSIS — E039 Hypothyroidism, unspecified: Secondary | ICD-10-CM

## 2012-07-31 DIAGNOSIS — R609 Edema, unspecified: Secondary | ICD-10-CM

## 2012-07-31 DIAGNOSIS — J45909 Unspecified asthma, uncomplicated: Secondary | ICD-10-CM

## 2012-07-31 LAB — COMPLETE METABOLIC PANEL WITH GFR
Albumin: 4.4 g/dL (ref 3.5–5.2)
BUN: 8 mg/dL (ref 6–23)
CO2: 32 mEq/L (ref 19–32)
Calcium: 9.3 mg/dL (ref 8.4–10.5)
Chloride: 91 mEq/L — ABNORMAL LOW (ref 96–112)
GFR, Est African American: >89 mL/min
GFR, Est Non African American: >89 mL/min
Glucose, Bld: 156 mg/dL — ABNORMAL HIGH (ref 70–99)
Potassium: 3.3 mEq/L — ABNORMAL LOW (ref 3.5–5.3)
Total Protein: 6.7 g/dL (ref 6.0–8.3)

## 2012-07-31 LAB — CBC WITH DIFFERENTIAL/PLATELET
Basophils Absolute: 0 10*3/uL (ref 0.0–0.1)
Basophils Relative: 1 % (ref 0–1)
Eosinophils Relative: 6 % — ABNORMAL HIGH (ref 0–5)
Lymphocytes Relative: 25 % (ref 12–46)
MCHC: 35.2 g/dL (ref 30.0–36.0)
Neutro Abs: 4 10*3/uL (ref 1.7–7.7)
Platelets: 251 10*3/uL (ref 150–400)
RDW: 13.7 % (ref 11.5–15.5)
WBC: 6.3 10*3/uL (ref 4.0–10.5)

## 2012-07-31 MED ORDER — HYDROMORPHONE HCL 2 MG PO TABS: 2.0000 mg | ORAL_TABLET | Freq: Four times a day (QID) | ORAL | Status: DC | PRN

## 2012-07-31 MED ORDER — FUROSEMIDE 20 MG PO TABS: 40.0000 mg | ORAL_TABLET | Freq: Every day | ORAL | Status: DC

## 2012-07-31 MED ORDER — POTASSIUM CHLORIDE ER 10 MEQ PO TBCR: 20.0000 meq | EXTENDED_RELEASE_TABLET | Freq: Every day | ORAL | Status: DC

## 2012-07-31 NOTE — Patient Instructions (Addendum)
Increase furosemide (Lasix) 20 mg to a dose of 2 tablets once daily. Increase potassium chloride 10 mEq to a dose of 2 capsules once daily. Return in one week for recheck.

## 2012-07-31 NOTE — Assessment & Plan Note (Addendum)
Assessment: Patient has bilateral leg edema which is stable since her last visit; it responded to an increased dose of furosemide 40 mg daily, but then recurred when she went back to a 20 mg daily dose.  A 2-D echocardiogram done in June of this year showed normal systolic function with estimated ejection fraction of 60% to 65%, and Doppler parameters were consistent with abnormal left ventricular relaxation (grade 1 diastolic dysfunction).  Plan: The plan is to increase her furosemide to a dose of 40 mg daily, and increase her potassium chloride to a dose of 20 mEq daily.  Patient will return in one week for reevaluation as well as recheck of her blood pressure, electrolytes, and renal function.  Since I will be out of town next week, I discussed her care with Dr. Tonny Branch who will see her back for that followup visit.

## 2012-07-31 NOTE — Progress Notes (Signed)
  Subjective:    Patient ID: Mary Gray, female    DOB: [birth year], 51 y.o.   MRN: [id number]  HPI Patient returns for followup of her diabetes, hypertension, hypothyroidism, and other chronic medical problems.  She reports that she has decided to proceed with back surgery to be done by Dr. Lovell Sheehan,  scheduled for week after next.  Her left leg laceration has continued to heal.  Following her last visit, she took an increased dose of Lasix for a few days as directed, then reduce to her baseline dose of 20 mg daily as directed; her leg edema improved with the higher dose, and then recurred.  She reports some left rib soreness and soreness in her left low back following a fall that occurred about one month ago in which she apparently fell forward and impacted her left midsection on a shopping cart.  She has chronic stable exertional dyspnea but no dyspnea at rest, no orthopnea, and no PND.  Patient did not bring her medications or her blood glucose meter to clinic today; she confirmed her medication list from memory.   Review of Systems  Constitutional: Negative for fever and chills.  Cardiovascular: Positive for leg swelling. Negative for chest pain.  Gastrointestinal: Negative for nausea, vomiting and abdominal pain.  Genitourinary: Negative for dysuria.       Objective:   Physical Exam  Constitutional: No distress.  Cardiovascular: Normal rate, regular rhythm and normal heart sounds.  Exam reveals no gallop and no friction rub.   No murmur heard.      1+ bilateral pitting leg edema  Pulmonary/Chest: Effort normal and breath sounds normal. No respiratory distress. She has no wheezes. She has no rales. She exhibits tenderness (Moderate tenderness over left anterolateral ribs).  Abdominal: Soft. Bowel sounds are normal. She exhibits no distension. There is no tenderness. There is no rebound and no guarding.  Skin:             Assessment & Plan:

## 2012-07-31 NOTE — Assessment & Plan Note (Signed)
Assessment: Patient is doing well on her current inhaler regimen.  Plan: Continue current medications.

## 2012-07-31 NOTE — Assessment & Plan Note (Signed)
Assessment: Patient has lumbar spinal stenosis with severe chronic low back pain and radicular symptoms.  She is followed by neurosurgeon Dr. Lovell Sheehan, who plans low back surgery later this month.  Plan: For now, continue current pain medication regimen which includes tramadol 50 mg every 6 hours as needed for pain and hydromorphone 2 mg every 6 hours as needed for pain.  I refilled the hydromorphone today with dispense #60 tablets, since her surgery may change her pain medication requirements.

## 2012-07-31 NOTE — Assessment & Plan Note (Signed)
  Component Value Date/Time   HGBA1C 6.7 05/15/2012 1607   HGBA1C 6.6 02/14/2012 0836   HGBA1C 6.4 11/01/2011 1157     Assessment: Diabetes control: controlled Progress toward goals: at goal Barriers to meeting goals: no barriers identified  Plan: Diabetes treatment: continue current medications (metformin XR 1000 mg daily) Refer to: none Instruction/counseling given: reminded to bring blood glucose meter & log to each visit, reminded to bring medications to each visit and discussed foot care

## 2012-07-31 NOTE — Assessment & Plan Note (Signed)
Assessment: Patient has no symptoms of thyroid dysfunction on her current dose of levothyroxine 75 mcg daily.  Plan: Will check a TSH today.

## 2012-07-31 NOTE — Assessment & Plan Note (Signed)
Assessment: Patient has left rib pain and tenderness following a fall that occurred about one month ago.  Plan: Will obtain chest x-ray, abdominal x-ray, and left rib detail x-rays today.

## 2012-07-31 NOTE — Assessment & Plan Note (Signed)
Lab Results  Component Value Date   NA 135 06/19/2012   K 3.4* 06/19/2012   CL 92* 06/19/2012   CO2 31 06/19/2012   BUN 7 06/19/2012   CREATININE 0.57 06/19/2012    BP Readings from Last 3 Encounters:  07/31/12 123/72  07/04/12 129/70  07/03/12 149/70    Assessment: Hypertension control:  controlled  Progress toward goals:  at goal Barriers to meeting goals:  no barriers identified  Plan: Increase furosemide to a dose of 40 mg daily as noted above to treat leg edema; continue enalapril 5 mg daily.

## 2012-07-31 NOTE — Assessment & Plan Note (Signed)
Assessment: Dr. Lovell Sheehan has requested preoperative clearance prior to planned spinal surgery.  Patient has diabetes mellitus which is well controlled on an oral agent, hypertension which is well-controlled, and chronic mild leg edema which may be due to diastolic dysfunction.  Her asthma is well-controlled.  She had a normal left ventricular ejection fraction and normal systolic function on an echocardiogram done in May of this year.  She has no history of coronary artery disease or systolic heart failure.  Plan: Plan is to obtain chest x-ray, 12-lead EKG, and labs.

## 2012-08-01 ENCOUNTER — Encounter: Payer: Self-pay | Admitting: Internal Medicine

## 2012-08-01 LAB — URINALYSIS, ROUTINE W REFLEX MICROSCOPIC
Bilirubin Urine: NEGATIVE
Glucose, UA: NEGATIVE mg/dL
Hgb urine dipstick: NEGATIVE
Protein, ur: NEGATIVE mg/dL
Urobilinogen, UA: 0.2 mg/dL (ref 0.0–1.0)

## 2012-08-02 ENCOUNTER — Ambulatory Visit (INDEPENDENT_AMBULATORY_CARE_PROVIDER_SITE_OTHER): Payer: Medicare Other | Admitting: Internal Medicine

## 2012-08-02 ENCOUNTER — Ambulatory Visit (HOSPITAL_COMMUNITY)
Admission: RE | Admit: 2012-08-02 | Discharge: 2012-08-02 | Disposition: A | Discharge status: Home / Self Care | Payer: Medicare HMO | Source: Ambulatory Visit | Attending: Internal Medicine | Admitting: Internal Medicine

## 2012-08-02 ENCOUNTER — Other Ambulatory Visit: Payer: Self-pay

## 2012-08-02 VITALS — BP 120/70 | HR 89 | Temp 97.7°F | Ht 61.0 in | Wt 217.9 lb

## 2012-08-02 DIAGNOSIS — E876 Hypokalemia: Secondary | ICD-10-CM

## 2012-08-02 DIAGNOSIS — Z01810 Encounter for preprocedural cardiovascular examination: Secondary | ICD-10-CM | POA: Insufficient documentation

## 2012-08-02 MED ORDER — POTASSIUM CHLORIDE CRYS ER 20 MEQ PO TBCR: 20.0000 meq | EXTENDED_RELEASE_TABLET | Freq: Two times a day (BID) | ORAL | Status: DC

## 2012-08-02 NOTE — Patient Instructions (Addendum)
Change potassium chloride to a dose of 20 mEq one tablet twice a day for 6 days, then reduce dose to 20 mEq once a day or as directed at your clinic visit next week. Return next week as scheduled for follow-up with Dr. Tonny Branch on Thursday 08/08/2012 at 1:30 PM.

## 2012-08-02 NOTE — Progress Notes (Signed)
Patient came in for an EKG to complete her preoperative assessment.  She has no new complaints today.  She increased her furosemide to a dose of 40 mg daily 2 days ago, and reports no new problems.  As part of her preoperative assessment, I asked her about symptoms of obstructive sleep apnea, and she has no symptoms to suggest OSA; she denies morning headaches, morning fatigue, frequent daytime somnolence, and difficulty breathing at night.  According to her sister, she does not snore and she does not have apneic spells at night.  A twelve-lead EKG done today is normal.  Chest x-ray done 07/31/2012 showed no acute cardiopulmonary disease, and other x-rays (rib details and abdominal x-ray) were negative.  Labs from 07/31/2012 are notable for mild hypokalemia with potassium of 3.3; the plan is to increase potassium chloride to a dose of 20 mEq twice a day for 6 days (I sent a separate prescription for the 20 mEq tablets today and instructed patient), then reduce to 20 mEq daily or as directed at the time of her return visit next week.  Patient is scheduled to see Dr. Tonny Branch on Thursday, September 19 for recheck given her recently increased Lasix dose of 40 mg daily; at that she will need a basic metabolic panel to assess her potassium and renal function, as well as assessment of her blood pressure and leg edema.  Her surgery is scheduled for the following week, and her potassium will need to be corrected prior to her surgery.

## 2012-08-07 ENCOUNTER — Telehealth: Payer: Self-pay | Admitting: *Deleted

## 2012-08-07 NOTE — Telephone Encounter (Signed)
Dr Lovell Sheehan office calls and request fax CLEARANCE for surgery,  Fax# [phone] attention: susan. Can this be done after 9/19 appt?

## 2012-08-07 NOTE — Telephone Encounter (Signed)
Yes. Dr Meredith Pel stared teh pre-op clearance process during the telephone note in the chart. Has  Long appt slot with Dr Tonny Branch to complete the pre-op process.

## 2012-08-08 ENCOUNTER — Encounter: Payer: Self-pay | Admitting: Internal Medicine

## 2012-08-08 ENCOUNTER — Ambulatory Visit (INDEPENDENT_AMBULATORY_CARE_PROVIDER_SITE_OTHER): Payer: Medicare Other | Admitting: Internal Medicine

## 2012-08-08 VITALS — BP 100/61 | HR 83 | Temp 98.2°F | Ht 61.0 in | Wt 210.6 lb

## 2012-08-08 DIAGNOSIS — E871 Hypo-osmolality and hyponatremia: Secondary | ICD-10-CM

## 2012-08-08 DIAGNOSIS — E039 Hypothyroidism, unspecified: Secondary | ICD-10-CM

## 2012-08-08 DIAGNOSIS — Z01818 Encounter for other preprocedural examination: Secondary | ICD-10-CM

## 2012-08-08 DIAGNOSIS — E876 Hypokalemia: Secondary | ICD-10-CM

## 2012-08-08 DIAGNOSIS — I10 Essential (primary) hypertension: Secondary | ICD-10-CM

## 2012-08-08 DIAGNOSIS — J45909 Unspecified asthma, uncomplicated: Secondary | ICD-10-CM

## 2012-08-08 DIAGNOSIS — E119 Type 2 diabetes mellitus without complications: Secondary | ICD-10-CM

## 2012-08-08 LAB — BASIC METABOLIC PANEL
Glucose, Bld: 108 mg/dL — ABNORMAL HIGH (ref 70–99)
Potassium: 3.6 mEq/L (ref 3.5–5.3)
Sodium: 135 mEq/L (ref 135–145)

## 2012-08-08 MED ORDER — POTASSIUM CHLORIDE ER 10 MEQ PO TBCR: 20.0000 meq | EXTENDED_RELEASE_TABLET | Freq: Two times a day (BID) | ORAL | Status: DC

## 2012-08-08 NOTE — Patient Instructions (Signed)
1.  Stop the Hydrochlorothiazide  2.  Continue the Lasix 40 mg once daily for your fluid  3.  Continue the Potassium 10 mEq tablets.  Take 2 tablets twice daily for your potassium  4.  Stop in the lab to have your blood drawn.  If everything is okay I will send the note to Dr. Lovell Sheehan that you are okay for surgery.  If there is something we need to follow I will call you.  5.  Follow up with Dr. Meredith Pel a few weeks after your surgery to see how you are doing.

## 2012-08-08 NOTE — Progress Notes (Signed)
Subjective:   Patient ID: MICHIKO MANDLER female   DOB: 08/25/1961 51 y.o.   MRN: [id number]  HPI: Ms.Sara SHERMEKA KLUGH is a 51 y.o. woman who presents today for preoperative clearance for surgery.  She is scheduled for Lumbar Laminectomy and microdiscectomy by Dr. Peggye Ley next week.  She has a PMH that is significant for hypothyroidism, hypertension, diabetes mellitus, and asthma.  She states that today she feels quite well.    She recently saw her PCP, Dr. Meredith Pel, because of increased leg swelling.  He had adjusted her diuretics by increasing her Lasix from 20 mg daily to 40 mg daily.  She was also found to be hyponatremic and hypokalemic at that visit.  She states that the swelling has resolved and she has been taking the increased lasix as well as the increased potassium supplementation that she was given.  She states that she had one episode on 9/16 when getting out of bed where she felts "swimmy headed" and fell.  She states that she hit her shoulder against a cabinet but that she was feeling fine today after that and has not had any more episodes of lightheadedness since then.    She has a history of diabetes that is well controlled on oral medications.  She is on treatment for hypertension and is well below her goal of <140/90.  Her asthma is well controlled with no recent flares.  EKG and chest x-ray were done for preoperative clearance and they were normal and she has no history of cardiac disease.  She is able to walk 4 blocks without any increase in her shortness of breath.  Her RCRI preoperative risk score is 0/6 which give her a 0.4% risk for surgery.   Past Medical History  Diagnosis Date  . Diabetes mellitus, type 2   . Hypertension   . Unspecified asthma   . Anxiety state, unspecified   . Hypercholesteremia   . Hypothyroidism   . Obesity   . GERD (gastroesophageal reflux disease)   . Iron deficiency anemia   . DDD (degenerative disc disease), lumbar   . Spinal stenosis   .  Low back pain   . Degeneration of cervical intervertebral disc   . Left knee pain     Following fall 11/15/2009.  Marland Kitchen Acne rosacea   . Heme positive stool     EGD on 04/08/2010 by Dr. Ewing Schlein showed chronic gastritis and a few gastric polyps; pathology showed a fundic gland polyp.  Colonoscopy on 04/08/2010 showed small external and internal hemorrhoids, and a few benign-appearing diminutive polyps in the rectum, the distal sigmoid colon, and in the distal descending colon; pathology showed hyperplastic polyps.   . Hematochezia   . Irregular menses   . Vagina bleeding   . Atrophic vaginitis   . Viral warts, unspecified     Right hand.  . Chronic interstitial cystitis     Followed by Dr. Marcine Matar  . Elevated transaminase level     Mild elevation, AST=48, ALT=51 on 02/01/07.  . Allergic rhinitis   . Carpal tunnel syndrome   . Atypical chest pain 12/11/2008    Normal stress nuclear study 01/12/2009 by Dr. Peter [place]  . Bilateral leg edema    Current Outpatient Prescriptions  Medication Sig Dispense Refill  . albuterol (PROVENTIL HFA;VENTOLIN HFA) 108 (90 BASE) MCG/ACT inhaler Inhale 2 puffs into the lungs every 6 (six) hours as needed. For shortness of breath      . ALPRAZolam Prudy Feeler) 1  MG tablet Take 1 mg by mouth 4 (four) times daily as needed. For anxiety      . Alum & Mag Hydroxide-Simeth (MAGIC MOUTHWASH) SOLN Use 5 mL four times a day as needed; swish and spit.  Contact physician if symptoms persist.  120 mL  0  . aspirin 81 MG chewable tablet Chew 81 mg by mouth daily.       . carisoprodol (SOMA) 350 MG tablet Take 350 mg by mouth 4 (four) times daily as needed. For pain      . docusate sodium (COLACE) 100 MG capsule Take 100 mg by mouth 2 (two) times daily.       . enalapril (VASOTEC) 5 MG tablet Take 1 tablet (5 mg total) by mouth daily.  30 tablet  6  . erythromycin ethylsuccinate (EES) 400 MG tablet Take 400 mg by mouth 2 (two) times daily.      . fluticasone (FLONASE) 50  MCG/ACT nasal spray Place 2 sprays into the nose daily as needed. For allergies      . fluticasone (FLOVENT DISKUS) 50 MCG/BLIST diskus inhaler Inhale 1 puff into the lungs 2 (two) times daily.      . furosemide (LASIX) 20 MG tablet Take 2 tablets (40 mg total) by mouth daily.  60 tablet  3  . glucose blood (ACCU-CHEK COMPACT TEST DRUM) test strip Use to Check Blood Glucose Daily.  ICD Code: 250.00  100 each  3  . glucose blood test strip Use for daily blood glucose testing.  Diagnosis ICD-9 250.00  100 each  3  . HYDROmorphone (DILAUDID) 2 MG tablet Take 1 tablet (2 mg total) by mouth every 6 (six) hours as needed for pain.  60 tablet  0  . ipratropium (ATROVENT HFA) 17 MCG/ACT inhaler Inhale 2 puffs into the lungs 4 (four) times daily as needed. For wheezing      . Lancet Devices (ACCU-CHEK SOFTCLIX) lancets Use to Check Blood Glucose Daily.  ICD Code: 250.00  100 each  3  . levothyroxine (SYNTHROID) 75 MCG tablet Take 75 mcg by mouth daily.      . metFORMIN (GLUCOPHAGE-XR) 500 MG 24 hr tablet Take 1,000 mg by mouth daily.       Marland Kitchen NEXIUM 40 MG capsule TAKE ONE CAPSULE EVERY DAY  31 capsule  6  . nystatin (MYCOSTATIN/NYSTOP) 100000 UNIT/GM POWD APPLY TOPICALLY TWO TIMES A DAY TO AFFECTED AREAS OF SKIN FOR 10 DAYS.  60 g  0  . ofloxacin (FLOXIN) 0.3 % otic solution PLACE 10 DROPS INTO THE RIGHT EAR DAILY FOR 7 DAYS  5 mL  0  . polyethylene glycol (MIRALAX / GLYCOLAX) packet Take 17 g by mouth daily as needed. For regularity      . potassium chloride (KLOR-CON 10) 10 MEQ tablet Take 2 tablets (20 mEq total) by mouth daily.  60 tablet  2  . potassium chloride SA (K-DUR,KLOR-CON) 20 MEQ tablet Take 1 tablet (20 mEq total) by mouth 2 (two) times daily. For 6 days, then resume previous dose.  12 tablet  0  . rOPINIRole (REQUIP) 3 MG tablet Take 3 mg by mouth at bedtime.      . rosuvastatin (CRESTOR) 10 MG tablet Take 10 mg by mouth daily.      . sertraline (ZOLOFT) 100 MG tablet Take 100 mg by mouth  daily.      . solifenacin (VESICARE) 10 MG tablet Take 10 mg by mouth daily.        Marland Kitchen  traMADol (ULTRAM) 50 MG tablet Take 50 mg by mouth every 6 (six) hours as needed. For pain      . zolpidem (AMBIEN) 10 MG tablet Take 10 mg by mouth at bedtime as needed. For sleep       Family History  Problem Relation Age of Onset  . Heart attack Father 72  . Breast cancer Neg Hx   . Colon cancer Neg Hx   . Diabetes Mother   . Hypertension Mother    History   Social History  . Marital Status: Single    Spouse Name: N/A    Number of Children: N/A  . Years of Education: N/A   Social History Main Topics  . Smoking status: Former Smoker -- 1.0 packs/day for 15 years    Types: Cigarettes    Quit date: 01/18/1996  . Smokeless tobacco: Never Used  . Alcohol Use: No  . Drug Use: None  . Sexually Active: None   Other Topics Concern  . None   Social History Narrative  . None   Review of Systems: Constitutional: Denies fever, chills, diaphoresis, appetite change and fatigue.  HEENT: Denies photophobia, eye pain, redness, hearing loss, ear pain, congestion, sore throat, rhinorrhea, sneezing, mouth sores, trouble swallowing, neck pain, neck stiffness and tinnitus.   Respiratory: Denies SOB, DOE, cough, chest tightness,  and wheezing.   Cardiovascular: Denies chest pain, palpitations and leg swelling.  Gastrointestinal: Denies nausea, vomiting, abdominal pain, diarrhea, constipation, blood in stool and abdominal distention.  Genitourinary: Denies dysuria, urgency, frequency, hematuria, flank pain and difficulty urinating.  Musculoskeletal: Denies myalgias, back pain, joint swelling, arthralgias and gait problem.  Skin: Denies pallor, rash and wound.  Neurological: Denies dizziness, seizures, syncope, weakness, light-headedness, numbness and headaches.  Hematological: Denies adenopathy. Easy bruising, personal or family bleeding history  Psychiatric/Behavioral: Denies suicidal ideation, mood  changes, confusion, nervousness, sleep disturbance and agitation  Objective:  Physical Exam: Filed Vitals:   08/08/12 1345  BP: 100/61  Pulse: 83  Temp: 98.2 F (36.8 C)  TempSrc: Oral  Weight: 210 lb 9.6 oz (95.528 kg)   Constitutional: Vital signs reviewed.  Patient is a well-developed and well-nourished woman in no acute distress and cooperative with exam. Alert and oriented x3.  Head: Normocephalic and atraumatic Ear: TM normal bilaterally Mouth: no erythema or exudates, MMM Eyes: PERRL, EOMI, conjunctivae normal, No scleral icterus.  Neck: Supple, Trachea midline normal ROM, No JVD, mass, thyromegaly, or carotid bruit present.  Cardiovascular: RRR, S1 normal, S2 normal, no MRG, pulses symmetric and intact bilaterally Pulmonary/Chest: CTAB, no wheezes, rales, or rhonchi Abdominal: Soft. Non-tender, non-distended, bowel sounds are normal, no masses, organomegaly, or guarding present.  GU: no CVA tenderness Musculoskeletal:  no joint deformities, erythema, or stiffness, ROM full and no nontender Hematology: no cervical, inginal, or axillary adenopathy.  Neurological: A&O x3, Strength is normal and symmetric bilaterally, cranial nerve II-XII are grossly intact, no focal motor deficit, sensory intact to light touch bilaterally.  Skin: Warm, dry and intact. No rash, cyanosis, or clubbing.  Psychiatric: Normal mood and affect. speech and behavior is normal. Judgment and thought content normal. Cognition and memory are normal.   Assessment & Plan:

## 2012-08-09 ENCOUNTER — Encounter (HOSPITAL_COMMUNITY): Admission: RE | Admit: 2012-08-09 | Payer: Medicare HMO | Source: Ambulatory Visit

## 2012-08-09 ENCOUNTER — Telehealth: Payer: Self-pay | Admitting: *Deleted

## 2012-08-09 NOTE — Assessment & Plan Note (Addendum)
Mary Gray is scheduled to undergo laminectomy next week by Dr. Lovell Sheehan.  Her preoperative risk factors are 0 of 6 which gives her a 0.4 percent risk score.  Her diabetes is well controlled on oral medications, her hypothyroidism is replete, and her blood pressure is well controlled.  Her EKG is normal and her Chest x-ray is clear.    Basic Metabolic Panel:    Component Value Date/Time   NA 135 08/08/2012 1418   K 3.6 08/08/2012 1418   CL 92* 08/08/2012 1418   CO2 34* 08/08/2012 1418   BUN 9 08/08/2012 1418   CREATININE 0.60 08/08/2012 1418   CREATININE 0.49* 01/02/2012 1514   GLUCOSE 108* 08/08/2012 1418   CALCIUM 9.9 08/08/2012 1418   Her bmet is replete for potassium and her sodium has returned to normal.  She is cleared for surgery.  Please see the problem based charting for the status of her other chronic medical conditions.

## 2012-08-09 NOTE — Assessment & Plan Note (Signed)
Lab Results  Component Value Date   NA 135 08/08/2012   K 3.6 08/08/2012   CL 92* 08/08/2012   CO2 34* 08/08/2012   BUN 9 08/08/2012   CREATININE 0.60 08/08/2012   CREATININE 0.49* 01/02/2012    BP Readings from Last 3 Encounters:  08/08/12 100/61  08/02/12 120/70  07/31/12 123/72    Assessment: Hypertension control:  controlled  Progress toward goals:  at goal Barriers to meeting goals:  no barriers identified  Plan: Hypertension treatment:  continue current medications She states that she has been taking both her HCTZ as well as her Lasix.  We will have her stop the HCTZ since she is likely not deriving any benefit from it and it could be contributing to her hypokalemia and hyponatremia.

## 2012-08-09 NOTE — Telephone Encounter (Signed)
Pt called stating she was seen in clinic yesterday on 9/19. She feels she got later that day. Body aches, nausea, stomach virus. Decrease appetite. She put off pre-surgery test. Pt called to check on her lab results.  Concerned about potassium.  K was 3.6  Pt advised to try small amounts of broth, jello. Rest.  She is afebrile. I did give her the 1600 pager # if needed.

## 2012-08-09 NOTE — Telephone Encounter (Signed)
Agree with mgmt. Thanks

## 2012-08-09 NOTE — Assessment & Plan Note (Signed)
Lab Results  Component Value Date   HGBA1C 6.7 05/15/2012   HGBA1C 6.0 10/05/2010   CREATININE 0.60 08/08/2012   CREATININE 0.49* 01/02/2012   MICROALBUR 0.50 04/29/2010   MICRALBCREAT 7.0 04/29/2010   CHOL 172 01/05/2012   HDL 44 01/05/2012   TRIG 135 01/05/2012    Last eye exam and foot exam: No results found for this basename: HMDIABEYEEXA, HMDIABFOOTEX    Assessment: Diabetes control: controlled Progress toward goals: at goal Barriers to meeting goals: no barriers identified  Plan: Diabetes treatment: continue current medications Refer to: none Instruction/counseling given: no instruction/counseling

## 2012-08-09 NOTE — Assessment & Plan Note (Signed)
Lab Results  Component Value Date   TSH 1.553 07/31/2012   Her TSH is replete today on her current dose of Levothyroxine.

## 2012-08-09 NOTE — Assessment & Plan Note (Signed)
Her asthma is well controlled on steroid inhalers and she is able to walk over 4 blocks without increased shortness of breath.

## 2012-08-09 NOTE — Assessment & Plan Note (Signed)
Sodium (mEq/L)  Date Value  08/08/2012 135   07/31/2012 133*  06/19/2012 135   05/21/2012 131*  04/10/2012 133*   She has a history of mild hyponatremia.  She has been taking both her HCTZ and Lasix.  We will have her stop her HCTZ and continue to monitor after surgery.

## 2012-08-09 NOTE — Assessment & Plan Note (Signed)
Potassium (mEq/L)  Date Value  08/08/2012 3.6   07/31/2012 3.3*  06/19/2012 3.4*  05/21/2012 3.9    Her potassium was mildly low but with an increase in her repletion to 40 mEq daily she has come back to normal.  She also has been taking both her HCTZ and Lasix so we will stop the HCTZ and continue to monitor.

## 2012-08-15 ENCOUNTER — Inpatient Hospital Stay (HOSPITAL_COMMUNITY): Admission: RE | Admit: 2012-08-15 | Payer: Medicare HMO | Source: Ambulatory Visit | Admitting: Neurosurgery

## 2012-08-15 ENCOUNTER — Encounter (HOSPITAL_COMMUNITY): Admission: RE | Payer: Self-pay | Source: Ambulatory Visit

## 2012-08-15 SURGERY — LUMBAR LAMINECTOMY/DECOMPRESSION MICRODISCECTOMY 3 LEVELS
Anesthesia: General

## 2012-08-20 ENCOUNTER — Other Ambulatory Visit: Payer: Self-pay | Admitting: *Deleted

## 2012-08-20 NOTE — Telephone Encounter (Signed)
Pt calls and ask for pain med to be refilled

## 2012-08-21 MED ORDER — TRAMADOL HCL 50 MG PO TABS: 50.0000 mg | ORAL_TABLET | Freq: Four times a day (QID) | ORAL | Status: DC | PRN

## 2012-08-21 MED ORDER — HYDROMORPHONE HCL 2 MG PO TABS: 2.0000 mg | ORAL_TABLET | Freq: Four times a day (QID) | ORAL | Status: DC | PRN

## 2012-08-21 MED ORDER — CARISOPRODOL 350 MG PO TABS: 350.0000 mg | ORAL_TABLET | Freq: Four times a day (QID) | ORAL | Status: DC | PRN

## 2012-08-21 NOTE — Telephone Encounter (Signed)
Refill for tramadol approved and sent electronically.  Refills for hydromorphone and carisoprodol printed and signed - nurse to complete. Please note that I had to reprint these two prescriptions due to incorrect dispense on the initial hydromorphone prescription; I destroyed the first printout and signed the second.

## 2012-08-22 NOTE — Telephone Encounter (Signed)
Pt informed Rx is ready 

## 2012-08-27 ENCOUNTER — Other Ambulatory Visit: Payer: Self-pay | Admitting: *Deleted

## 2012-08-27 DIAGNOSIS — B373 Candidiasis of vulva and vagina: Secondary | ICD-10-CM

## 2012-08-27 NOTE — Telephone Encounter (Signed)
Please clarify this request; is patient having symptoms?

## 2012-08-28 MED ORDER — FLUCONAZOLE 150 MG PO TABS: 150.0000 mg | ORAL_TABLET | Freq: Once | ORAL | Status: AC

## 2012-08-28 NOTE — Telephone Encounter (Signed)
Pt states her labia and vagina are burning, itching really bad x  1week. Used [facility] brand 3 day cream, helped very little says usually the only thing that helps is the pill

## 2012-08-28 NOTE — Telephone Encounter (Signed)
Called pt left her message to call back

## 2012-08-28 NOTE — Telephone Encounter (Signed)
I will refill for single dose treatment of a probable Candidal infection; she should take one 150 mg tablet once.  Please advise her to stop her erythromycin for three days after taking the fluconazole due to possible interaction between the two medications.  If her symptoms persist, she needs to be seen in clinic.

## 2012-08-28 NOTE — Telephone Encounter (Signed)
i called pt and gave her instructions, she repeated back to me, she will take the diflucan tonight and not take the abx x 3 days

## 2012-09-25 ENCOUNTER — Other Ambulatory Visit: Payer: Self-pay | Admitting: *Deleted

## 2012-09-26 MED ORDER — HYDROMORPHONE HCL 2 MG PO TABS: 2.0000 mg | ORAL_TABLET | Freq: Four times a day (QID) | ORAL | Status: DC | PRN

## 2012-10-05 ENCOUNTER — Other Ambulatory Visit: Payer: Self-pay | Admitting: Internal Medicine

## 2012-10-14 ENCOUNTER — Other Ambulatory Visit: Payer: Self-pay | Admitting: *Deleted

## 2012-10-14 MED ORDER — GLUCOSE BLOOD VI STRP: ORAL_STRIP | Status: DC

## 2012-10-14 NOTE — Telephone Encounter (Signed)
Pt changing pharm for diab supplies

## 2012-10-23 ENCOUNTER — Ambulatory Visit (INDEPENDENT_AMBULATORY_CARE_PROVIDER_SITE_OTHER): Payer: Medicare HMO | Admitting: Internal Medicine

## 2012-10-23 ENCOUNTER — Encounter: Payer: Self-pay | Admitting: Internal Medicine

## 2012-10-23 ENCOUNTER — Telehealth: Payer: Self-pay | Admitting: *Deleted

## 2012-10-23 VITALS — BP 113/72 | HR 76 | Temp 97.4°F | Wt 224.5 lb

## 2012-10-23 DIAGNOSIS — Z79899 Other long term (current) drug therapy: Secondary | ICD-10-CM

## 2012-10-23 DIAGNOSIS — M5430 Sciatica, unspecified side: Secondary | ICD-10-CM

## 2012-10-23 DIAGNOSIS — M5440 Lumbago with sciatica, unspecified side: Secondary | ICD-10-CM

## 2012-10-23 DIAGNOSIS — E119 Type 2 diabetes mellitus without complications: Secondary | ICD-10-CM

## 2012-10-23 DIAGNOSIS — J329 Chronic sinusitis, unspecified: Secondary | ICD-10-CM | POA: Insufficient documentation

## 2012-10-23 DIAGNOSIS — I10 Essential (primary) hypertension: Secondary | ICD-10-CM

## 2012-10-23 LAB — GLUCOSE, CAPILLARY: Glucose-Capillary: 131 mg/dL — ABNORMAL HIGH (ref 70–99)

## 2012-10-23 LAB — POCT GLYCOSYLATED HEMOGLOBIN (HGB A1C): Hemoglobin A1C: 6.6

## 2012-10-23 MED ORDER — LEVOFLOXACIN 500 MG PO TABS: 500.0000 mg | ORAL_TABLET | Freq: Every day | ORAL | Status: DC

## 2012-10-23 MED ORDER — HYDROMORPHONE HCL 2 MG PO TABS: 2.0000 mg | ORAL_TABLET | Freq: Four times a day (QID) | ORAL | Status: DC | PRN

## 2012-10-23 MED ORDER — NYSTATIN 100000 UNIT/GM EX POWD: CUTANEOUS | Status: DC

## 2012-10-23 NOTE — Patient Instructions (Signed)
General Instructions: Take levofloxacin (Levaquin) 500 mg one tablet daily for 7 days. Do not take erythromycin while taking the levofloxacin. Call or return if your symptoms persist or worsen.   Treatment Goals:  Goals (1 Years of Data) as of 10/23/2012          As of Today 08/08/12 08/02/12 07/31/12 07/04/12     Blood Pressure    . Blood Pressure < 140/80  113/72 100/61 120/70 123/72 129/70     Result Component    . HEMOGLOBIN A1C < 7.0  6.6        . LDL CALC < 100            Progress Toward Treatment Goals:  Treatment Goal 10/23/2012  Hemoglobin A1C at goal  Blood pressure at goal  Other  unchanged    Self Care Goals & Plans:  Self Care Goal 10/23/2012  Manage my medications bring my medications to every visit; take my medicines as prescribed  Monitor my health keep track of my blood glucose; bring my glucose meter and log to each visit  Eat healthy foods drink diet soda or water instead of juice or soda  Be physically active find an activity I enjoy    Home Blood Glucose Monitoring 10/23/2012  Check my blood sugar once a day  When to check my blood sugar before breakfast     Care Management & Community Referrals:  Referral 10/23/2012  Referrals made for care management support none needed  Referrals made to community resources (No Data)

## 2012-10-23 NOTE — Assessment & Plan Note (Signed)
Hemoglobin A1C  Date Value Range Status  10/23/2012 6.6   Final  05/15/2012 6.7   Final  02/14/2012 6.6   Final     Assessment:  Diabetes control: good control (HgbA1C at goal)  Progress toward A1C goal:  at goal  Comments: currently on metformin XR 1000 mg daily  Plan:  Medications:  continue current medications  Home glucose monitoring:   Frequency: once a day   Timing: before breakfast  Instruction/counseling given: reminded to bring blood glucose meter & log to each visit  Educational resources provided: brochure  Self management tools provided: home glucose logbook

## 2012-10-23 NOTE — Telephone Encounter (Signed)
Pt called and states she arrived at pharmacy to find that her scripts were not there, i called and gave pharmacist the abx and powder scripts

## 2012-10-23 NOTE — Assessment & Plan Note (Signed)
Assessment: Patient has symptoms and findings consistent with acute maxillary sinusitis following an upper respiratory infection.  Plan: Treat with levofloxacin 500 mg daily for 7 days; symptomatic treatment of nasal congestion; I advised patient to call or return if her symptoms persist or worsen.

## 2012-10-23 NOTE — Assessment & Plan Note (Signed)
Assessment: Patient has lumbar spinal stenosis with chronic low back pain and radicular symptoms.  She was scheduled for surgery by Dr. Lovell Sheehan in September, but canceled the surgery apparently because she was sick.  She has not yet followed up with Dr. Lovell Sheehan.  Of concern, patient has a medication contract and I have been prescribing hydromorphone for her back pain.  Review of her external medication refills today showed that she has had hydrocodone/acetaminophen refilled 3 times over the past 3 months for 90 tablets at each refill.  I discussed at length with her today that it is a violation of her opioid contract to get narcotic pain medications from other providers.  Plan: I did not refill patient's hydromorphone today, since she received prescriptions for both medications in November.  I advised her that I would reassess after a month and decide whether to continue the hydromorphone.  She understands that I will not continue prescribing opioid medications if there is another violation of her controlled medication contract.

## 2012-10-23 NOTE — Assessment & Plan Note (Signed)
BP Readings from Last 3 Encounters:  10/23/12 113/72  08/08/12 100/61  08/02/12 120/70   Lab Results  Component Value Date   NA 135 08/08/2012   K 3.6 08/08/2012   CREATININE 0.60 08/08/2012     Assessment:  Blood pressure control: controlled  Progress toward BP goal:  at goal  Comments: currently taking enalapril 5 mg daily and furosemide 40 mg daily  Plan:  Medications:  continue current medications  Educational resources provided: brochure  Self management tools provided: home blood pressure logbook  Other plans: check a basic metabolic panel today

## 2012-10-23 NOTE — Progress Notes (Signed)
  Subjective:    Patient ID: Mary Gray, female    DOB: 04-22-1961, 51 y.o.   MRN: [id number]  HPI Patient presents for follow-up of her diabetes mellitus, hypertension, chronic low back pain with sciatica, and other chronic medical problems.  She also has an acute complaint of upper respiratory symptoms for the past 1-1/2 weeks which include nasal congestion, sore throat, sinus pressure/pain, and a nonproductive cough.  She reports no recent sick contacts.  She reports that she has been compliant with her medications.  She canceled her back surgery in September and has not yet followed up with Dr. Lovell Sheehan to have it rescheduled.   Review of Systems  Constitutional: Negative for fever.  Respiratory: Positive for wheezing (Intermittent).   Cardiovascular: Negative for chest pain.  Gastrointestinal: Positive for nausea. Negative for vomiting and abdominal pain.  Genitourinary: Negative for dysuria, frequency and difficulty urinating.  Musculoskeletal: Positive for myalgias.       Objective:   Physical Exam  Constitutional: No distress.  HENT:  Right Ear: Tympanic membrane, external ear and ear canal normal.  Left Ear: Tympanic membrane, external ear and ear canal normal.  Nose: No rhinorrhea. Right sinus exhibits maxillary sinus tenderness. Right sinus exhibits no frontal sinus tenderness. Left sinus exhibits maxillary sinus tenderness. Left sinus exhibits no frontal sinus tenderness.  Mouth/Throat: Uvula is midline and oropharynx is clear and moist. No oropharyngeal exudate or tonsillar abscesses.  Cardiovascular: Normal rate, regular rhythm and normal heart sounds.  Exam reveals no gallop and no friction rub.   No murmur heard. Pulmonary/Chest: Effort normal and breath sounds normal. No respiratory distress. She has no wheezes. She has no rales.  Abdominal: Soft. Bowel sounds are normal. There is no tenderness.  Musculoskeletal: She exhibits no edema.       Assessment & Plan:

## 2012-10-24 LAB — BASIC METABOLIC PANEL WITH GFR
Chloride: 95 mEq/L — ABNORMAL LOW (ref 96–112)
GFR, Est Non African American: >89 mL/min
Potassium: 4.2 mEq/L (ref 3.5–5.3)

## 2012-11-01 ENCOUNTER — Telehealth: Payer: Self-pay | Admitting: *Deleted

## 2012-11-01 NOTE — Telephone Encounter (Signed)
Pt informed

## 2012-11-01 NOTE — Telephone Encounter (Signed)
Pt called stating she was seen in clinic on 12/4 for sinusitis and was treated with antibiotics for 7 days. She is not feeling any better.  Antibiotics are gone. She still has sore throat, stuffy nose, earache congestion, and cough. She has decreased appetite.  Using Nyquil but no relief. Pt # [phone] Please advise. Pt scheduled for appointment Monday AM

## 2012-11-01 NOTE — Telephone Encounter (Signed)
I recommend she visit urgent care to be re-evaluated tomorrow, thanks.

## 2012-11-04 ENCOUNTER — Encounter: Payer: Self-pay | Admitting: Internal Medicine

## 2012-11-04 ENCOUNTER — Ambulatory Visit (INDEPENDENT_AMBULATORY_CARE_PROVIDER_SITE_OTHER): Payer: Medicare HMO | Admitting: Internal Medicine

## 2012-11-04 VITALS — BP 113/74 | HR 74 | Temp 96.8°F | Ht 61.0 in | Wt 220.1 lb

## 2012-11-04 DIAGNOSIS — J329 Chronic sinusitis, unspecified: Secondary | ICD-10-CM

## 2012-11-04 DIAGNOSIS — M5430 Sciatica, unspecified side: Secondary | ICD-10-CM

## 2012-11-04 DIAGNOSIS — M5440 Lumbago with sciatica, unspecified side: Secondary | ICD-10-CM

## 2012-11-04 DIAGNOSIS — R05 Cough: Secondary | ICD-10-CM

## 2012-11-04 DIAGNOSIS — E119 Type 2 diabetes mellitus without complications: Secondary | ICD-10-CM

## 2012-11-04 LAB — GLUCOSE, CAPILLARY: Glucose-Capillary: 127 mg/dL — ABNORMAL HIGH (ref 70–99)

## 2012-11-04 MED ORDER — BENZONATATE 100 MG PO CAPS: 100.0000 mg | ORAL_CAPSULE | Freq: Three times a day (TID) | ORAL | Status: DC | PRN

## 2012-11-04 MED ORDER — TRAMADOL HCL 50 MG PO TABS: 50.0000 mg | ORAL_TABLET | Freq: Four times a day (QID) | ORAL | Status: DC | PRN

## 2012-11-04 NOTE — Patient Instructions (Signed)
General Instructions:  Please follow-up at the clinic in 1 month, at which time we will reevaluate your cough, congestion, pain - OR, please follow-up in the clinic sooner if needed.  There have been changes in your medications:  Take Tessalon perles as needed for your cough.    If you have been started on new medication(s), and you develop symptoms concerning for allergic reaction, including, but not limited to, throat closing, tongue swelling, rash, please stop the medication immediately and call the clinic at [phone], and go to the ER.  If you are diabetic, please bring your meter to your next visit.  If symptoms worsen, or new symptoms arise, please call the clinic or go to the ER.  PLEASE BRING ALL OF YOUR MEDICATIONS  IN A BAG TO YOUR NEXT APPOINTMENT   Treatment Goals:  Goals (1 Years of Data) as of 11/04/2012          As of Today 10/23/12 08/08/12 08/02/12 07/31/12     Blood Pressure    . Blood Pressure < 140/80  113/74 113/72 100/61 120/70 123/72     Result Component    . HEMOGLOBIN A1C < 7.0   6.6       . LDL CALC < 100            Progress Toward Treatment Goals:  Treatment Goal 10/23/2012  Hemoglobin A1C at goal  Blood pressure at goal  Other  unchanged    Self Care Goals & Plans:  Self Care Goal 11/04/2012  Manage my medications bring my medications to every visit; refill my medications on time; take my medicines as prescribed  Monitor my health keep track of my blood glucose; bring my glucose meter and log to each visit  Eat healthy foods drink diet soda or water instead of juice or soda; eat more vegetables; eat foods that are low in salt  Be physically active find an activity I enjoy    Home Blood Glucose Monitoring 10/23/2012  Check my blood sugar once a day  When to check my blood sugar before breakfast     Care Management & Community Referrals:  Referral 10/23/2012  Referrals made for care management support none needed  Referrals made to  community resources (No Data)

## 2012-11-04 NOTE — Progress Notes (Signed)
Patient: Mary Gray   MRN: [id number]  DOB: [birth year]  PCP: Mary Ly, MD   Subjective:    HPI: [name] is a 51 y.o. female with a PMHx of well-controlled type 2 diabetes, severe lumbar degenerative disc disease with spinal stenosis, hypothyroidism, chronic pain (with recent infraction of her pain contract), carpal tunnel syndrome who presented to clinic today for the following:  1) URI - Patient describes a 2-3 week history of productive of brown sputum cough that is unchanged since time of onset. Admits to associated chills and facial pain, body aches, sore throat, nasal congestion. The patient was evaluated in clinic on 10/23/2012 by her PCP, who felt that the patient had a maxillary sinusitis atop her URI. She was prescribed a seven-day course of Levaquin, which the patient states she is completed. During the Levaquin treatment, the patient experienced worsening reflux and had to space out her PPI. However, since discontinuing the medication approximately 5 days ago, the GERD symptoms have stabilized. The patient confirms that she is using her albuterol approximately once daily instead of only when necessary when she is feeling well.  2) Chronic pain - patient indicates that she is out of her chronic narcotics that are prescribed by her PCP and other specialists. She requests refills of these medications today.   Review of Systems: Per HPI.   Current Outpatient Medications: Medication Sig  . albuterol (PROVENTIL HFA;VENTOLIN HFA) 108 (90 BASE) MCG/ACT inhaler Inhale 2 puffs into the lungs every 6 (six) hours as needed. For shortness of breath  . ALPRAZolam (XANAX) 1 MG tablet Take 1 mg by mouth 4 (four) times daily as needed. For anxiety  . amitriptyline (ELAVIL) 25 MG tablet Take 25 mg by mouth At bedtime.  Marland Kitchen aspirin 81 MG chewable tablet Chew 81 mg by mouth daily.   . carisoprodol (SOMA) 350 MG tablet Take 1 tablet (350 mg total) by mouth 4 (four) times daily as  needed for muscle spasms.  Marland Kitchen docusate sodium (COLACE) 100 MG capsule Take 100 mg by mouth 2 (two) times daily.   . enalapril (VASOTEC) 5 MG tablet TAKE 1 TABLET EVERY DAY  . erythromycin ethylsuccinate (EES) 400 MG tablet Take 400 mg by mouth 2 (two) times daily.  . fluticasone (FLONASE) 50 MCG/ACT nasal spray Place 2 sprays into the nose daily as needed. For allergies  . fluticasone (FLOVENT DISKUS) 50 MCG/BLIST diskus inhaler Inhale 1 puff into the lungs 2 (two) times daily.  . furosemide (LASIX) 20 MG tablet Take 2 tablets (40 mg total) by mouth daily.  Marland Kitchen glucose blood (ACCU-CHEK COMPACT TEST DRUM) test strip Use to Check Blood Glucose Daily.  ICD Code: 250.00, not insulin dependent  . HYDROmorphone (DILAUDID) 2 MG tablet Take 1 tablet (2 mg total) by mouth every 6 (six) hours as needed for pain.  Marland Kitchen ipratropium (ATROVENT HFA) 17 MCG/ACT inhaler Inhale 2 puffs into the lungs 4 (four) times daily as needed. For wheezing  . KRISTALOSE 20 G packet Take 20 g by mouth 2 (two) times daily as needed.  Demetra Shiner Devices (ACCU-CHEK SOFTCLIX) lancets Use to Check Blood Glucose Daily.  ICD Code: 250.00  . levothyroxine (SYNTHROID) 75 MCG tablet Take 75 mcg by mouth daily.  . metFORMIN (GLUCOPHAGE-XR) 500 MG 24 hr tablet Take 1,000 mg by mouth daily.   Marland Kitchen NEXIUM 40 MG capsule TAKE ONE CAPSULE EVERY DAY  . nystatin (MYCOSTATIN/NYSTOP) 100000 UNIT/GM POWD APPLY TOPICALLY TWO TIMES A DAY TO AFFECTED  AREAS OF SKIN FOR 10 DAYS.  Marland Kitchen polyethylene glycol (MIRALAX / GLYCOLAX) packet Take 17 g by mouth daily as needed. For regularity  . potassium chloride (KLOR-CON 10) 10 MEQ tablet Take 2 tablets (20 mEq total) by mouth 2 (two) times daily.  Marland Kitchen rOPINIRole (REQUIP) 3 MG tablet Take 3 mg by mouth at bedtime.  . rosuvastatin (CRESTOR) 10 MG tablet Take 10 mg by mouth daily.  . sertraline (ZOLOFT) 100 MG tablet Take 100 mg by mouth daily.  . solifenacin (VESICARE) 10 MG tablet Take 10 mg by mouth daily.    . traMADol  (ULTRAM) 50 MG tablet Take 1 tablet (50 mg total) by mouth every 6 (six) hours as needed for pain.  Marland Kitchen zolpidem (AMBIEN) 10 MG tablet Take 10 mg by mouth at bedtime as needed. For sleep     Allergies  Allergen Reactions  . Codeine Sulfate Other (See Comments)    Confusion, anxiety, fatigue  . Meperidine And Related Nausea And Vomiting  . Minocycline Hcl Nausea And Vomiting    Hospitalized for vomiting along with allergic reaction  . Penicillins Other (See Comments)    unknown  . Propoxyphene-Acetaminophen Other (See Comments)    confused  . Tetracycline Hcl Other (See Comments)    vomiting  . Vicodin (Hydrocodone-Acetaminophen) Other (See Comments)    confusion    Past Medical History  Diagnosis Date  . Diabetes mellitus, type 2   . Hypertension   . Unspecified asthma   . Anxiety state, unspecified   . Hypercholesteremia   . Hypothyroidism   . Obesity   . GERD (gastroesophageal reflux disease)   . Iron deficiency anemia   . DDD (degenerative disc disease), lumbar   . Spinal stenosis   . Low back pain   . Degeneration of cervical intervertebral disc   . Left knee pain     Following fall 11/15/2009.  Marland Kitchen Acne rosacea   . Heme positive stool     EGD on 04/08/2010 by Dr. Ewing Schlein showed chronic gastritis and a few gastric polyps; pathology showed a fundic gland polyp.  Colonoscopy on 04/08/2010 showed small external and internal hemorrhoids, and a few benign-appearing diminutive polyps in the rectum, the distal sigmoid colon, and in the distal descending colon; pathology showed hyperplastic polyps.   . Hematochezia   . Irregular menses   . Vagina bleeding   . Atrophic vaginitis   . Viral warts, unspecified     Right hand.  . Chronic interstitial cystitis     Followed by Dr. Marcine Matar  . Elevated transaminase level     Mild elevation, AST=48, ALT=51 on 02/01/07.  . Allergic rhinitis   . Carpal tunnel syndrome   . Atypical chest pain 12/11/2008    Normal stress  nuclear study 01/12/2009 by Dr. Peter [place]  . Bilateral leg edema     Past Surgical History  Procedure Date  . Carpal tunnel release 05/08/2000    By Dr. Katy Fitch. Sypher, Montez Hageman.     Objective:    Physical Exam: Filed Vitals:   11/04/12 0905  BP: 113/74  Pulse: 74  Temp: 96.8 F (36 C)     General: Vital signs reviewed and noted. Well-developed, well-nourished, in no acute distress; alert, appropriate and cooperative throughout examination.  Head: Normocephalic, atraumatic. Maxillary sinus tenderness to palpation bilaterally.   Eyes: conjunctivae/corneas clear. PERRL, EOM's intact.  Ears: TM nonerythematous, not bulging, good light reflex bilaterally.  Nose: Mucous membranes moist, not inflammed, nonerythematous.  Throat:  Oropharynx nonerythematous, no exudate appreciated.   Neck: No deformities, masses, or tenderness noted.  Lungs:  Normal respiratory effort. Clear to auscultation BL without crackles or wheezes.  Heart: RRR. S1 and S2 normal without gallop, rubs. No murmur.  Abdomen:  BS normoactive. Soft, Nondistended, non-tender.  No masses or organomegaly.  Extremities: No pretibial edema.     Assessment/ Plan:   Case and plan of care discussed with attending physician, Dr. Blanch Media.

## 2012-11-04 NOTE — Assessment & Plan Note (Signed)
Assessment: Patient requests refills of her hydromorphone today. However, during her last PCP visit on 10/23/2012, there was concern raised regarding violation of the patient's previously signed a pain contract. I again obtained narcotics database search for the patient today. Her refill history is listed below, and is clearly inappropriate. There multiple providers prescribing the below mentioned medications. I discussed with Dr. Meredith Pel, who agrees that there is questionable behavior present. Therefore, I will not be prescribing her narcotics until there is further clarity towards what the ultimate plan is in order to ensure safe use of narcotics.  09/27/2012   hydromorphone 2 mg tablets #120   09/26/2012   hydrocodone-acetaminophen 10-325 #90   08/30/2012  hydrocodone-acetaminophen 10-325 #90   08/22/2012   hydromorphone 2 mg #120   08/16/2012   hydrocodone-acetaminophen 10-325 #50   07/31/2012   Hydromorphone 2 mg #60   07/16/2012   hydrocodone-acetaminophen 10-325 #50   07/03/2012   oxycodone-acetaminophen 5/325 #15   06/20/2012   hydromorphone 2 mg #120     Plan:      Refill of tramadol only today.   Patient is scheduled with her PCP, Dr. Meredith Pel, in January 2014 for continued discussion regarding narcotic use.

## 2012-11-04 NOTE — Assessment & Plan Note (Signed)
Assessment: Per physical exam, there are no indications of worsening of her infection. She has completed a seven-day course of Levaquin already. Symptoms seem to be stabilizing although not resolved at this time. She is not having fevers. Therefore, I do not feel that additional antibiotics are warranted.  Plan:      Continue supportive care.   Rx for Mary Gray was sent to her pharmacy.

## 2012-11-09 ENCOUNTER — Other Ambulatory Visit: Payer: Self-pay | Admitting: Internal Medicine

## 2012-11-11 ENCOUNTER — Other Ambulatory Visit: Payer: Self-pay | Admitting: Internal Medicine

## 2012-11-24 ENCOUNTER — Other Ambulatory Visit: Payer: Self-pay | Admitting: Internal Medicine

## 2012-11-25 ENCOUNTER — Other Ambulatory Visit: Payer: Self-pay | Admitting: *Deleted

## 2012-11-25 DIAGNOSIS — M5440 Lumbago with sciatica, unspecified side: Secondary | ICD-10-CM

## 2012-11-25 NOTE — Telephone Encounter (Signed)
Rx called in to pharmacy. 

## 2012-11-25 NOTE — Telephone Encounter (Signed)
Refill approved - nurse to call in. 

## 2012-11-26 MED ORDER — TRAMADOL HCL 50 MG PO TABS: 50.0000 mg | ORAL_TABLET | Freq: Four times a day (QID) | ORAL | Status: DC | PRN

## 2012-11-26 NOTE — Telephone Encounter (Signed)
Pt aware of Dr Meredith Pel note.

## 2012-11-26 NOTE — Telephone Encounter (Signed)
Because of previously noted concerns, I am not willing to refill hydromorphone at this time.  Patient should keep her appointment 1/15 with me to discuss her pain management.  If patient is out of tramadol, I am willing to refill that medication.

## 2012-12-04 ENCOUNTER — Ambulatory Visit (HOSPITAL_COMMUNITY)
Admission: RE | Admit: 2012-12-04 | Discharge: 2012-12-04 | Disposition: A | Discharge status: Home / Self Care | Payer: Medicare HMO | Source: Ambulatory Visit | Attending: Internal Medicine | Admitting: Internal Medicine

## 2012-12-04 ENCOUNTER — Ambulatory Visit (INDEPENDENT_AMBULATORY_CARE_PROVIDER_SITE_OTHER): Payer: Medicare HMO | Admitting: Internal Medicine

## 2012-12-04 ENCOUNTER — Encounter: Payer: Self-pay | Admitting: Internal Medicine

## 2012-12-04 VITALS — BP 114/75 | HR 92 | Temp 100.7°F | Ht 61.0 in | Wt 227.9 lb

## 2012-12-04 DIAGNOSIS — R509 Fever, unspecified: Secondary | ICD-10-CM | POA: Insufficient documentation

## 2012-12-04 DIAGNOSIS — J329 Chronic sinusitis, unspecified: Secondary | ICD-10-CM

## 2012-12-04 DIAGNOSIS — I10 Essential (primary) hypertension: Secondary | ICD-10-CM

## 2012-12-04 DIAGNOSIS — E119 Type 2 diabetes mellitus without complications: Secondary | ICD-10-CM

## 2012-12-04 DIAGNOSIS — M5430 Sciatica, unspecified side: Secondary | ICD-10-CM

## 2012-12-04 DIAGNOSIS — R05 Cough: Secondary | ICD-10-CM | POA: Insufficient documentation

## 2012-12-04 DIAGNOSIS — R059 Cough, unspecified: Secondary | ICD-10-CM | POA: Insufficient documentation

## 2012-12-04 DIAGNOSIS — M5440 Lumbago with sciatica, unspecified side: Secondary | ICD-10-CM

## 2012-12-04 NOTE — Assessment & Plan Note (Signed)
BP Readings from Last 3 Encounters:  12/04/12 114/75  11/04/12 113/74  10/23/12 113/72    Lab Results  Component Value Date   NA 132* 10/23/2012   K 4.2 10/23/2012   CREATININE 0.59 10/23/2012    Assessment:  Blood pressure control: controlled  Progress toward BP goal:  at goal  Comments: blood pressure is well controlled on current regimen of enalapril 5 mg daily and furosemide 40 mg daily  Plan:  Medications:  continue current medications

## 2012-12-04 NOTE — Assessment & Plan Note (Signed)
Assessment: Patient has symptoms of chronic congestion, postnasal drip, and fullness consistent with chronic sinusitis.  She has a low-grade temperature of 100.7 today.  Her symptoms did not improve following a one-week course of oral levofloxacin in December.  Plan: CT scan of the sinuses to assess for chronic sinusitis; symptomatic treatment with decongestants pending that result.

## 2012-12-04 NOTE — Patient Instructions (Signed)
   Treatment Goals:  Goals (1 Years of Data) as of 12/04/2012          As of Today 11/04/12 10/23/12 08/08/12 08/02/12     Blood Pressure    . Blood Pressure < 140/80  114/75 113/74 113/72 100/61 120/70     Result Component    . HEMOGLOBIN A1C < 7.0    6.6      . LDL CALC < 100            Progress Toward Treatment Goals:  Treatment Goal 12/04/2012  Hemoglobin A1C at goal  Blood pressure at goal  Other  unchanged    Self Care Goals & Plans:  Self Care Goal 11/04/2012  Manage my medications bring my medications to every visit; refill my medications on time; take my medicines as prescribed  Monitor my health keep track of my blood glucose; bring my glucose meter and log to each visit  Eat healthy foods drink diet soda or water instead of juice or soda; eat more vegetables; eat foods that are low in salt  Be physically active find an activity I enjoy    Home Blood Glucose Monitoring 12/04/2012  Check my blood sugar once a day  When to check my blood sugar before breakfast     Care Management & Community Referrals:  Referral 12/04/2012  Referrals made for care management support none needed  Referrals made to community resources -

## 2012-12-04 NOTE — Assessment & Plan Note (Signed)
Assessment: Patient has persistent cough which may be due to sinus drainage or bronchitis.  Her lung exam is clear.  Plan: Check a chest x-ray today.

## 2012-12-04 NOTE — Assessment & Plan Note (Signed)
Assessment: Patient has spinal stenosis and chronic severe low back pain with sciatica.  Her planned surgery was canceled last fall, and she has not yet followed up with her neurosurgeon Dr. Lovell Sheehan.  She reports that her pain is not relieved by tramadol.  Concerns were raised in December because she had been receiving refills of hydrocodone at the same time I was prescribing oral hydromorphone for management of her chronic pain.  I spoke with her at length today about this.  She says that she did not understand that this was a violation of her pain contract, so I went over the contract in detail with her.  Plan: Will check a urine drug screen today.  If that looks okay, then will cautiously reinstitute treatment with oral hydromorphone 2 mg every 12 hours as needed for pain, with close follow up to make sure patient is compliant with our treatment guidelines and her pain contract.  Patient understands any further violations of her pain contract may result in our refusing further opioid refills.

## 2012-12-04 NOTE — Progress Notes (Signed)
  Subjective:    Patient ID: Mary Gray, female    DOB: 11/09/1961, 52 y.o.   MRN: [id number]  HPI Patient returns for followup of her sinusitis, chronic low back pain due to spinal stenosis, diabetes, and other chronic medical problems.  Patient reports persisting symptoms of sinus congestion with nasal drainage since her last visit; she reports no improvement following her one-week course of oral levofloxacin in December.  She reports ongoing low back pain which is not relieved by tramadol; she has not followed up with her neurosurgeon Dr. Lovell Sheehan since her surgery was canceled last fall.  She reports that she has been compliant with her medications.  Review of Systems  HENT: Positive for congestion, postnasal drip and sinus pressure.   Respiratory: Positive for cough.   Musculoskeletal: Positive for back pain.       Objective:   Physical Exam  HENT:  Head: Normocephalic and atraumatic.  Right Ear: Tympanic membrane, external ear and ear canal normal.  Left Ear: Tympanic membrane, external ear and ear canal normal.  Nose: Right sinus exhibits no maxillary sinus tenderness and no frontal sinus tenderness. Left sinus exhibits no maxillary sinus tenderness and no frontal sinus tenderness.  Mouth/Throat: Posterior oropharyngeal erythema present. No oropharyngeal exudate or posterior oropharyngeal edema.  Cardiovascular: Normal rate and regular rhythm.  Exam reveals no gallop and no friction rub.   No murmur heard. Pulmonary/Chest: Effort normal and breath sounds normal. She has no wheezes. She has no rales.  Musculoskeletal: She exhibits no edema.       Assessment & Plan:

## 2012-12-05 LAB — PRESCRIPTION ABUSE MONITORING 15P, URINE
Amphetamine/Meth: NEGATIVE ng/mL
Barbiturate Screen, Urine: NEGATIVE ng/mL
Buprenorphine, Urine: NEGATIVE ng/mL
Fentanyl, Ur: NEGATIVE ng/mL
Meperidine, Ur: NEGATIVE ng/mL
Propoxyphene: NEGATIVE ng/mL

## 2012-12-06 LAB — BENZODIAZEPINES (GC/LC/MS), URINE
Alprazolam (GC/LC/MS), ur confirm: 431 ng/mL
Clonazepam metabolite (GC/LC/MS), ur confirm: NEGATIVE ng/mL
Estazolam (GC/LC/MS), ur confirm: NEGATIVE ng/mL
Lorazepam (GC/LC/MS), ur confirm: NEGATIVE ng/mL
Oxazepam (GC/LC/MS), ur confirm: 136 ng/mL
Temazepam (GC/LC/MS), ur confirm: NEGATIVE ng/mL

## 2012-12-06 LAB — ZOLPIDEM (LC/MS-MS), URINE: Zolpidem metabolite (GC/LC/MS) Ur, confirm: >10000 ng/mL — ABNORMAL HIGH

## 2012-12-06 LAB — TRAMADOL, URINE
N-DESMETHYL-CIS-TRAMADOL: >1000 ng/mL — ABNORMAL HIGH
Tramadol, Urine: 2248 ng/mL — ABNORMAL HIGH

## 2012-12-06 LAB — CARISOPRODOL (GC/LC/MS), URINE: CARISOPRODOL (GC/LC/MS), ur confirm: 572 ng/mL

## 2012-12-10 ENCOUNTER — Other Ambulatory Visit: Payer: Self-pay | Admitting: *Deleted

## 2012-12-10 NOTE — Telephone Encounter (Signed)
Last appt 12/03/12; UDS done.

## 2012-12-10 NOTE — Telephone Encounter (Signed)
Pt had called and left message about Dilaudid refill; returned pt's call but no answer.

## 2012-12-10 NOTE — Telephone Encounter (Signed)
Her UDS looks appropriate but since she has violated the contract in past, will leave to Dr Meredith Pel since he is her primary.

## 2012-12-11 MED ORDER — HYDROMORPHONE HCL 2 MG PO TABS: 2.0000 mg | ORAL_TABLET | Freq: Two times a day (BID) | ORAL | Status: DC | PRN

## 2012-12-11 NOTE — Telephone Encounter (Signed)
Refill printed and signed for hydromorphone 2 mg tablet, #60, take 1 tablet BID PRN pain - nurse to complete.

## 2012-12-12 NOTE — Telephone Encounter (Signed)
Called pt, made her aware script is ready, discussed amt, reminded her of discussion she had w/ dr Meredith Pel re: script, she said ok and hung up

## 2012-12-26 ENCOUNTER — Ambulatory Visit (HOSPITAL_COMMUNITY): Payer: Medicare HMO

## 2013-01-08 ENCOUNTER — Other Ambulatory Visit: Payer: Self-pay | Admitting: Internal Medicine

## 2013-01-14 ENCOUNTER — Ambulatory Visit (HOSPITAL_COMMUNITY): Payer: Medicare HMO

## 2013-01-21 ENCOUNTER — Ambulatory Visit (HOSPITAL_COMMUNITY)
Admission: RE | Admit: 2013-01-21 | Discharge: 2013-01-21 | Disposition: A | Discharge status: Home / Self Care | Payer: Medicare HMO | Source: Ambulatory Visit | Attending: Internal Medicine | Admitting: Internal Medicine

## 2013-01-21 DIAGNOSIS — J3489 Other specified disorders of nose and nasal sinuses: Secondary | ICD-10-CM | POA: Insufficient documentation

## 2013-01-21 DIAGNOSIS — R059 Cough, unspecified: Secondary | ICD-10-CM | POA: Insufficient documentation

## 2013-01-21 DIAGNOSIS — R509 Fever, unspecified: Secondary | ICD-10-CM | POA: Insufficient documentation

## 2013-01-21 DIAGNOSIS — R05 Cough: Secondary | ICD-10-CM | POA: Insufficient documentation

## 2013-01-21 DIAGNOSIS — J329 Chronic sinusitis, unspecified: Secondary | ICD-10-CM

## 2013-01-22 ENCOUNTER — Ambulatory Visit: Payer: Medicare HMO | Admitting: Internal Medicine

## 2013-01-26 ENCOUNTER — Other Ambulatory Visit: Payer: Self-pay | Admitting: Internal Medicine

## 2013-02-10 ENCOUNTER — Other Ambulatory Visit: Payer: Self-pay | Admitting: Internal Medicine

## 2013-02-10 NOTE — Telephone Encounter (Signed)
Refill approved - nurse to complete. 

## 2013-02-11 ENCOUNTER — Other Ambulatory Visit: Payer: Self-pay | Admitting: Internal Medicine

## 2013-02-11 ENCOUNTER — Other Ambulatory Visit: Payer: Self-pay | Admitting: *Deleted

## 2013-02-11 NOTE — Telephone Encounter (Signed)
Rx called in to pharmacy. 

## 2013-02-11 NOTE — Telephone Encounter (Signed)
Last filled 12/10/12 Call pt at [phone]

## 2013-02-12 ENCOUNTER — Telehealth: Payer: Self-pay | Admitting: *Deleted

## 2013-02-12 MED ORDER — HYDROMORPHONE HCL 2 MG PO TABS: 2.0000 mg | ORAL_TABLET | Freq: Two times a day (BID) | ORAL | Status: DC | PRN

## 2013-02-12 NOTE — Telephone Encounter (Signed)
Refill printed and signed - nurse to complete. 

## 2013-02-12 NOTE — Telephone Encounter (Signed)
I called patient and discussed her recent sinus CT scan results which showed no significant findings.  She has a follow-up appointment in clinic on 4/16.

## 2013-02-12 NOTE — Progress Notes (Signed)
This was in my in-basket but there is no info in it.

## 2013-02-12 NOTE — Telephone Encounter (Signed)
Rx ready, pt informed 

## 2013-02-12 NOTE — Telephone Encounter (Signed)
Pt would like for dr Meredith Pel to call her asap with radiology results

## 2013-02-24 ENCOUNTER — Other Ambulatory Visit: Payer: Self-pay | Admitting: Internal Medicine

## 2013-02-24 DIAGNOSIS — Z1231 Encounter for screening mammogram for malignant neoplasm of breast: Secondary | ICD-10-CM

## 2013-02-25 ENCOUNTER — Ambulatory Visit (HOSPITAL_COMMUNITY): Payer: Medicare Other | Attending: Internal Medicine

## 2013-02-27 ENCOUNTER — Ambulatory Visit: Payer: Medicare Other | Admitting: Obstetrics

## 2013-03-03 ENCOUNTER — Ambulatory Visit (HOSPITAL_COMMUNITY)
Admission: RE | Admit: 2013-03-03 | Discharge: 2013-03-03 | Disposition: A | Discharge status: Home / Self Care | Payer: Medicare HMO | Source: Ambulatory Visit | Attending: Internal Medicine | Admitting: Internal Medicine

## 2013-03-03 DIAGNOSIS — Z1231 Encounter for screening mammogram for malignant neoplasm of breast: Secondary | ICD-10-CM | POA: Insufficient documentation

## 2013-03-04 ENCOUNTER — Other Ambulatory Visit: Payer: Self-pay | Admitting: Internal Medicine

## 2013-03-04 DIAGNOSIS — R928 Other abnormal and inconclusive findings on diagnostic imaging of breast: Secondary | ICD-10-CM

## 2013-03-05 ENCOUNTER — Emergency Department (HOSPITAL_COMMUNITY): Payer: Medicaid Other

## 2013-03-05 ENCOUNTER — Ambulatory Visit (INDEPENDENT_AMBULATORY_CARE_PROVIDER_SITE_OTHER): Payer: Medicare HMO | Admitting: Internal Medicine

## 2013-03-05 ENCOUNTER — Ambulatory Visit (HOSPITAL_COMMUNITY): Admission: RE | Admit: 2013-03-05 | Payer: Medicare HMO | Source: Ambulatory Visit

## 2013-03-05 ENCOUNTER — Encounter: Payer: Self-pay | Admitting: Internal Medicine

## 2013-03-05 ENCOUNTER — Emergency Department (HOSPITAL_COMMUNITY)
Admission: EM | Admit: 2013-03-05 | Discharge: 2013-03-05 | Disposition: A | Discharge status: Home / Self Care | Payer: Medicaid Other | Attending: Emergency Medicine | Admitting: Emergency Medicine

## 2013-03-05 VITALS — BP 111/67 | HR 73 | Temp 97.6°F | Ht 61.0 in | Wt 236.5 lb

## 2013-03-05 DIAGNOSIS — Z8739 Personal history of other diseases of the musculoskeletal system and connective tissue: Secondary | ICD-10-CM | POA: Insufficient documentation

## 2013-03-05 DIAGNOSIS — M79604 Pain in right leg: Secondary | ICD-10-CM

## 2013-03-05 DIAGNOSIS — Y9289 Other specified places as the place of occurrence of the external cause: Secondary | ICD-10-CM | POA: Insufficient documentation

## 2013-03-05 DIAGNOSIS — IMO0002 Reserved for concepts with insufficient information to code with codable children: Secondary | ICD-10-CM | POA: Insufficient documentation

## 2013-03-05 DIAGNOSIS — M79605 Pain in left leg: Secondary | ICD-10-CM | POA: Insufficient documentation

## 2013-03-05 DIAGNOSIS — E7800 Pure hypercholesterolemia, unspecified: Secondary | ICD-10-CM

## 2013-03-05 DIAGNOSIS — Z79899 Other long term (current) drug therapy: Secondary | ICD-10-CM | POA: Insufficient documentation

## 2013-03-05 DIAGNOSIS — Z87891 Personal history of nicotine dependence: Secondary | ICD-10-CM | POA: Insufficient documentation

## 2013-03-05 DIAGNOSIS — E039 Hypothyroidism, unspecified: Secondary | ICD-10-CM

## 2013-03-05 DIAGNOSIS — E669 Obesity, unspecified: Secondary | ICD-10-CM | POA: Insufficient documentation

## 2013-03-05 DIAGNOSIS — M7989 Other specified soft tissue disorders: Secondary | ICD-10-CM

## 2013-03-05 DIAGNOSIS — S46909A Unspecified injury of unspecified muscle, fascia and tendon at shoulder and upper arm level, unspecified arm, initial encounter: Secondary | ICD-10-CM | POA: Insufficient documentation

## 2013-03-05 DIAGNOSIS — Z8742 Personal history of other diseases of the female genital tract: Secondary | ICD-10-CM | POA: Insufficient documentation

## 2013-03-05 DIAGNOSIS — W19XXXA Unspecified fall, initial encounter: Secondary | ICD-10-CM

## 2013-03-05 DIAGNOSIS — E119 Type 2 diabetes mellitus without complications: Secondary | ICD-10-CM | POA: Insufficient documentation

## 2013-03-05 DIAGNOSIS — J45909 Unspecified asthma, uncomplicated: Secondary | ICD-10-CM | POA: Insufficient documentation

## 2013-03-05 DIAGNOSIS — Z87448 Personal history of other diseases of urinary system: Secondary | ICD-10-CM | POA: Insufficient documentation

## 2013-03-05 DIAGNOSIS — R609 Edema, unspecified: Secondary | ICD-10-CM

## 2013-03-05 DIAGNOSIS — Z8669 Personal history of other diseases of the nervous system and sense organs: Secondary | ICD-10-CM | POA: Insufficient documentation

## 2013-03-05 DIAGNOSIS — Z880 Allergy status to penicillin: Secondary | ICD-10-CM | POA: Insufficient documentation

## 2013-03-05 DIAGNOSIS — R600 Localized edema: Secondary | ICD-10-CM

## 2013-03-05 DIAGNOSIS — I10 Essential (primary) hypertension: Secondary | ICD-10-CM

## 2013-03-05 DIAGNOSIS — W07XXXA Fall from chair, initial encounter: Secondary | ICD-10-CM | POA: Insufficient documentation

## 2013-03-05 DIAGNOSIS — M5430 Sciatica, unspecified side: Secondary | ICD-10-CM

## 2013-03-05 DIAGNOSIS — K219 Gastro-esophageal reflux disease without esophagitis: Secondary | ICD-10-CM | POA: Insufficient documentation

## 2013-03-05 DIAGNOSIS — Z872 Personal history of diseases of the skin and subcutaneous tissue: Secondary | ICD-10-CM | POA: Insufficient documentation

## 2013-03-05 DIAGNOSIS — Z8679 Personal history of other diseases of the circulatory system: Secondary | ICD-10-CM | POA: Insufficient documentation

## 2013-03-05 DIAGNOSIS — D509 Iron deficiency anemia, unspecified: Secondary | ICD-10-CM

## 2013-03-05 DIAGNOSIS — Z862 Personal history of diseases of the blood and blood-forming organs and certain disorders involving the immune mechanism: Secondary | ICD-10-CM | POA: Insufficient documentation

## 2013-03-05 DIAGNOSIS — M549 Dorsalgia, unspecified: Secondary | ICD-10-CM

## 2013-03-05 DIAGNOSIS — Z7982 Long term (current) use of aspirin: Secondary | ICD-10-CM | POA: Insufficient documentation

## 2013-03-05 DIAGNOSIS — Z8639 Personal history of other endocrine, nutritional and metabolic disease: Secondary | ICD-10-CM | POA: Insufficient documentation

## 2013-03-05 DIAGNOSIS — S4980XA Other specified injuries of shoulder and upper arm, unspecified arm, initial encounter: Secondary | ICD-10-CM | POA: Insufficient documentation

## 2013-03-05 DIAGNOSIS — Y9389 Activity, other specified: Secondary | ICD-10-CM | POA: Insufficient documentation

## 2013-03-05 DIAGNOSIS — F411 Generalized anxiety disorder: Secondary | ICD-10-CM | POA: Insufficient documentation

## 2013-03-05 DIAGNOSIS — Z8619 Personal history of other infectious and parasitic diseases: Secondary | ICD-10-CM | POA: Insufficient documentation

## 2013-03-05 LAB — GLUCOSE, CAPILLARY: Glucose-Capillary: 151 mg/dL — ABNORMAL HIGH (ref 70–99)

## 2013-03-05 LAB — COMPLETE METABOLIC PANEL WITHOUT GFR
ALT: 20 U/L (ref 0–35)
AST: 25 U/L (ref 0–37)
Albumin: 3.9 g/dL (ref 3.5–5.2)
Alkaline Phosphatase: 87 U/L (ref 39–117)
BUN: 13 mg/dL (ref 6–23)
CO2: 30 meq/L (ref 19–32)
Calcium: 9.5 mg/dL (ref 8.4–10.5)
Chloride: 99 meq/L (ref 96–112)
Creat: 0.63 mg/dL (ref 0.50–1.10)
GFR, Est African American: >89 mL/min
GFR, Est Non African American: >89 mL/min
Glucose, Bld: 154 mg/dL — ABNORMAL HIGH (ref 70–99)
Potassium: 4.4 meq/L (ref 3.5–5.3)
Sodium: 137 meq/L (ref 135–145)
Total Bilirubin: 0.3 mg/dL (ref 0.3–1.2)
Total Protein: 7.1 g/dL (ref 6.0–8.3)

## 2013-03-05 LAB — CBC WITH DIFFERENTIAL/PLATELET
Eosinophils Absolute: 0.5 10*3/uL (ref 0.0–0.7)
Eosinophils Relative: 6 % — ABNORMAL HIGH (ref 0–5)
Lymphs Abs: 1.7 10*3/uL (ref 0.7–4.0)
MCH: 29.2 pg (ref 26.0–34.0)
MCHC: 34.2 g/dL (ref 30.0–36.0)
MCV: 85.2 fL (ref 78.0–100.0)
Monocytes Relative: 5 % (ref 3–12)
Platelets: 226 10*3/uL (ref 150–400)
RBC: 3.91 MIL/uL (ref 3.87–5.11)

## 2013-03-05 LAB — LIPID PANEL
HDL: 37 mg/dL — ABNORMAL LOW (ref >39)
Total CHOL/HDL Ratio: 3.1 Ratio
Triglycerides: 169 mg/dL — ABNORMAL HIGH (ref <150)

## 2013-03-05 LAB — POCT GLYCOSYLATED HEMOGLOBIN (HGB A1C): Hemoglobin A1C: 7.1

## 2013-03-05 MED ORDER — NYSTATIN 100000 UNIT/GM EX POWD: CUTANEOUS | Status: DC

## 2013-03-05 MED ORDER — TRAMADOL HCL 50 MG PO TABS
50.0000 mg | ORAL_TABLET | Freq: Four times a day (QID) | ORAL | Status: DC | PRN
Start: 2013-03-05 — End: 2013-03-28

## 2013-03-05 MED ORDER — METFORMIN HCL ER 500 MG PO TB24: 1500.0000 mg | ORAL_TABLET | Freq: Every day | ORAL | Status: DC

## 2013-03-05 MED ORDER — HYDROMORPHONE HCL PF 2 MG/ML IJ SOLN
2.0000 mg | Freq: Once | INTRAMUSCULAR | Status: AC
  Administered 2013-03-05: 2 mg via INTRAMUSCULAR
  Filled 2013-03-05: qty 1

## 2013-03-05 MED ORDER — METHOCARBAMOL 500 MG PO TABS: 500.0000 mg | ORAL_TABLET | Freq: Two times a day (BID) | ORAL | Status: DC

## 2013-03-05 NOTE — Assessment & Plan Note (Addendum)
Assessment: Patient's bilateral leg edema has worsened over the past 2 weeks, and she now has associated bilateral leg pain and tenderness with some mild erythema.  The etiology of her worsening edema with associated tenderness is not clear.  As previously noted, a 2-D echocardiogram done 05/09/2012 showed normal left ventricular systolic function with an estimated ejection fraction of 60-65%; Doppler parameters were consistent with abnormal left ventricular relaxation (grade 1 diastolic dysfunction), and her symptoms may represent worsening diastolic heart failure.  However, with her relative immobility, DVT is possible; also, given the mild erythema, she may have a cellulitic component, although this seems unlikely.  Plan: The plan is to obtain a bilateral lower extremity venous duplex study today to rule out DVT; if negative, increase furosemide to a dose of 60 mg daily and if no improvement consider treating presumptively for possible cellulitis with an oral antibiotic.  Will also obtain a comprehensive metabolic panel and a proBNP level.

## 2013-03-05 NOTE — ED Notes (Signed)
Per pt she was in radiology getting registered and the chair fell out from under her causing her to fall to the right side, hit her shoulder and the back of her head. Denies LOC. MAE at this time, denies numbness, tingling. C/o back pain.

## 2013-03-05 NOTE — ED Notes (Signed)
Pt transported to radiology.

## 2013-03-05 NOTE — Assessment & Plan Note (Addendum)
Lipids:    Component Value Date/Time   CHOL 172 01/05/2012 0942   TRIG 135 01/05/2012 0942   HDL 44 01/05/2012 0942   LDLCALC 101* 01/05/2012 0942   VLDL 27 01/05/2012 0942   CHOLHDL 3.9 01/05/2012 0942    Assessment: Patient Is doing well on rosuvastatin 10 mg daily without apparent side effects.  Plan: Check a lipid panel today; continue rosuvastatin 10 mg daily pending the results.

## 2013-03-05 NOTE — Patient Instructions (Addendum)
General Instructions: Increase metformin XR 500 mg to a dose of 3 tablets daily. Increase furosemide 20 mg to a dose of 3 tablets daily.    Treatment Goals:  Goals (1 Years of Data) as of 03/05/13         As of Today As of Today 12/04/12 11/04/12 10/23/12     Blood Pressure    . Blood Pressure < 140/80  132/71 111/67 114/75 113/74 113/72     Result Component    . HEMOGLOBIN A1C < 7.0  7.1    6.6    . LDL CALC < 100            Progress Toward Treatment Goals:  Treatment Goal 03/05/2013  Hemoglobin A1C deteriorated  Blood pressure at goal    Self Care Goals & Plans:  Self Care Goal 03/05/2013  Manage my medications take my medicines as prescribed; bring my medications to every visit  Monitor my health bring my glucose meter and log to each visit  Eat healthy foods -  Be physically active -    Home Blood Glucose Monitoring 03/05/2013  Check my blood sugar once a day  When to check my blood sugar before breakfast     Care Management & Community Referrals:  Referral 03/05/2013  Referrals made for care management support none needed  Referrals made to community resources -

## 2013-03-05 NOTE — Progress Notes (Signed)
  Subjective:    Patient ID: Mary Gray, female    DOB: [birth year], 52 y.o.   MRN: [id number]  HPI Patient presents with complaint of worsening bilateral leg edema which has progressed over the past 2 weeks, with associated bilateral leg pain and mild erythema; she also presents for follow-up of diabetes mellitus, hypertension, hyperlipidemia, and her other chronic medical problems.  She reports that she has been compliant with her medications including her furosemide.  She has chronic back pain for which she has been taking tramadol along with oral hydromorphone up to twice a day on an as-needed basis.  She has recently followed up with her urologist, who continued her current medications and also recently treated her for a vaginal yeast infection.     Review of Systems  Respiratory: Negative for shortness of breath.   Cardiovascular: Positive for leg swelling (Bilateral, worse over past 2 weeks). Negative for chest pain.  Gastrointestinal: Negative for nausea, vomiting and abdominal pain.  Musculoskeletal: Positive for back pain (Chronic, stable.).       Objective:   Physical Exam  Constitutional: No distress.  Cardiovascular: Normal rate, regular rhythm and normal heart sounds.  Exam reveals no gallop and no friction rub.   No murmur heard. 2+ bilateral leg edema with associated tenderness and mild diffuse erythema  Pulmonary/Chest: Effort normal and breath sounds normal. No respiratory distress. She has no wheezes. She has no rales.  Abdominal: Soft. Bowel sounds are normal. There is no tenderness.       Assessment & Plan:

## 2013-03-05 NOTE — ED Notes (Signed)
PA at bedside for assessment

## 2013-03-05 NOTE — Progress Notes (Signed)
*  PRELIMINARY RESULTS* Vascular Ultrasound Lower extremity venous duplex has been completed.  Preliminary findings: Bilateral:  No evidence of DVT, superficial thrombosis, or Baker's Cyst.   Farrel Demark, RDMS, RVT  03/05/2013, 3:45 PM

## 2013-03-05 NOTE — Assessment & Plan Note (Signed)
Lab Results  Component Value Date   HGB 11.5* 07/31/2012   HGB 11.9* 05/21/2012     Assessment: Patient has no symptoms of anemia.  Plan: Will check a CBC today.

## 2013-03-05 NOTE — Assessment & Plan Note (Signed)
Lab Results  Component Value Date   HGBA1C 7.1 03/05/2013   HGBA1C 6.6 10/23/2012   HGBA1C 6.7 05/15/2012     Assessment: Diabetes control: fair control Progress toward A1C goal:  deteriorated Comments: Hemoglobin A1c is slightly above target on metformin XR 1000 mg daily  Plan: Medications:  increase metformin XR to a dose of 1500 mg daily Home glucose monitoring: Frequency: once a day Timing: before breakfast Instruction/counseling given: reminded to get eye exam, reminded to bring blood glucose meter & log to each visit and reminded to bring medications to each visit Educational resources provided: brochure Other plans: recheck hemoglobin A1c in 3 months

## 2013-03-05 NOTE — ED Provider Notes (Signed)
History     CSN: [id number]  Arrival date & time 03/05/13  1316   First MD Initiated Contact with Patient 03/05/13 1320      Chief Complaint  Patient presents with  . Fall    (Consider location/radiation/quality/duration/timing/severity/associated sxs/prior treatment) HPI Comments: Patient presents to the ED after having sustained a mechanical fall while going to get an xray in radiology today.  Patient states that the chair she was sitting on gave out and she fell to the ground.  She is complaining of back pain, and right shoulder pain.  She arrives on a back board and in a C-collar.  She denies any LOC.  She denies any weakness, numbness, or tingling.  She states that her pain is 10/10.  She has not tried anything to alleviate her symptoms.  The history is provided by the patient. No language interpreter was used.    Past Medical History  Diagnosis Date  . Diabetes mellitus, type 2   . Hypertension   . Unspecified asthma   . Anxiety state, unspecified   . Hypercholesteremia   . Hypothyroidism   . Obesity   . GERD (gastroesophageal reflux disease)   . Iron deficiency anemia   . DDD (degenerative disc disease), lumbar   . Spinal stenosis   . Low back pain   . Degeneration of cervical intervertebral disc   . Left knee pain     Following fall 11/15/2009.  Marland Kitchen Acne rosacea   . Heme positive stool     EGD on 04/08/2010 by Dr. Ewing Schlein showed chronic gastritis and a few gastric polyps; pathology showed a fundic gland polyp.  Colonoscopy on 04/08/2010 showed small external and internal hemorrhoids, and a few benign-appearing diminutive polyps in the rectum, the distal sigmoid colon, and in the distal descending colon; pathology showed hyperplastic polyps.   . Hematochezia   . Irregular menses   . Vagina bleeding   . Atrophic vaginitis   . Viral warts, unspecified     Right hand.  . Chronic interstitial cystitis     Followed by Dr. Marcine Matar  . Elevated transaminase level      Mild elevation, AST=48, ALT=51 on 02/01/07.  . Allergic rhinitis   . Carpal tunnel syndrome   . Atypical chest pain 12/11/2008    Normal stress nuclear study 01/12/2009 by Dr. Peter [place]  . Bilateral leg edema     Past Surgical History  Procedure Laterality Date  . Carpal tunnel release  05/08/2000    By Dr. Katy Fitch. Sypher, Montez Hageman.    Family History  Problem Relation Age of Onset  . Heart attack Father 48  . Breast cancer Neg Hx   . Colon cancer Neg Hx   . Diabetes Mother   . Hypertension Mother     History  Substance Use Topics  . Smoking status: Former Smoker -- 1.00 packs/day for 15 years    Types: Cigarettes    Quit date: 01/18/1996  . Smokeless tobacco: Never Used  . Alcohol Use: No    OB History   Grav Para Term Preterm Abortions TAB SAB Ect Mult Living                  Review of Systems  All other systems reviewed and are negative.    Allergies  Codeine sulfate; Meperidine and related; Minocycline hcl; Penicillins; Propoxyphene-acetaminophen; Tetracycline hcl; and Vicodin  Home Medications   Current Outpatient Rx  Name  Route  Sig  Dispense  Refill  .  albuterol (PROVENTIL HFA;VENTOLIN HFA) 108 (90 BASE) MCG/ACT inhaler   Inhalation   Inhale 2 puffs into the lungs every 6 (six) hours as needed. For shortness of breath         . ALPRAZolam (XANAX) 1 MG tablet   Oral   Take 1 mg by mouth 4 (four) times daily as needed. For anxiety         . amitriptyline (ELAVIL) 25 MG tablet   Oral   Take 25 mg by mouth At bedtime.         Marland Kitchen aspirin 81 MG chewable tablet   Oral   Chew 81 mg by mouth daily.          . carisoprodol (SOMA) 350 MG tablet      TAKE 1 TABLET BY MOUTH 4 TIMES A DAY AS NEEDED FOR MUSCLE SPASMS   120 tablet   2   . docusate sodium (COLACE) 100 MG capsule   Oral   Take 100 mg by mouth 2 (two) times daily.          . enalapril (VASOTEC) 5 MG tablet      TAKE 1 TABLET EVERY DAY   30 tablet   6   . erythromycin  ethylsuccinate (EES) 400 MG tablet   Oral   Take 400 mg by mouth 2 (two) times daily.         . fluticasone (FLONASE) 50 MCG/ACT nasal spray   Nasal   Place 2 sprays into the nose daily as needed. For allergies         . fluticasone (FLOVENT DISKUS) 50 MCG/BLIST diskus inhaler   Inhalation   Inhale 1 puff into the lungs 2 (two) times daily.         . furosemide (LASIX) 20 MG tablet      TAKE 2 TABLETS BY MOUTH DAILY   60 tablet   3   . glucose blood (ACCU-CHEK COMPACT TEST DRUM) test strip      Use to Check Blood Glucose Daily.  ICD Code: 250.00, not insulin dependent   100 each   3   . HYDROmorphone (DILAUDID) 2 MG tablet   Oral   Take 1 tablet (2 mg total) by mouth 2 (two) times daily as needed for pain.   60 tablet   0   . ipratropium (ATROVENT HFA) 17 MCG/ACT inhaler   Inhalation   Inhale 2 puffs into the lungs 4 (four) times daily as needed. For wheezing         . KRISTALOSE 20 G packet   Oral   Take 20 g by mouth 2 (two) times daily as needed.         Demetra [employer] (ACCU-CHEK SOFTCLIX) lancets      Use to Check Blood Glucose Daily.  ICD Code: 250.00   100 each   3   . levothyroxine (SYNTHROID) 75 MCG tablet   Oral   Take 75 mcg by mouth daily.         . metFORMIN (GLUCOPHAGE-XR) 500 MG 24 hr tablet   Oral   Take 3 tablets (1,500 mg total) by mouth daily.   90 tablet   6   . NEXIUM 40 MG capsule      TAKE ONE CAPSULE EVERY DAY   31 capsule   6   . nystatin (MYCOSTATIN/NYSTOP) 100000 UNIT/GM POWD      APPLY TO AFFECTED AREA TWICE A DAY FOR 10 DAYS   60 g  0   . polyethylene glycol (MIRALAX / GLYCOLAX) packet   Oral   Take 17 g by mouth daily as needed. For regularity         . potassium chloride (KLOR-CON M10) 10 MEQ tablet   Oral   Take 2 tablets (20 mEq total) by mouth 2 (two) times daily.   120 tablet   2   . rOPINIRole (REQUIP) 3 MG tablet   Oral   Take 3 mg by mouth at bedtime.         . rosuvastatin  (CRESTOR) 10 MG tablet   Oral   Take 10 mg by mouth daily.         . sertraline (ZOLOFT) 100 MG tablet   Oral   Take 100 mg by mouth daily.         . solifenacin (VESICARE) 10 MG tablet   Oral   Take 10 mg by mouth daily.           . traMADol (ULTRAM) 50 MG tablet   Oral   Take 1 tablet (50 mg total) by mouth every 6 (six) hours as needed for pain. Can cause drowsiness, do not drive or operate heavy machinery.   60 tablet   0   . zolpidem (AMBIEN) 10 MG tablet   Oral   Take 10 mg by mouth at bedtime as needed. For sleep           BP 132/71  Pulse 81  Temp(Src) 97.5 F (36.4 C) (Oral)  Resp 18  SpO2 99%  LMP 01/17/2009  Physical Exam  Nursing note and vitals reviewed. Constitutional: She is oriented to person, place, and time. She appears well-developed and well-nourished. No distress.  HENT:  Head: Normocephalic and atraumatic.  Eyes: Conjunctivae and EOM are normal. Right eye exhibits no discharge. Left eye exhibits no discharge. No scleral icterus.  Neck: Normal range of motion. Neck supple. No tracheal deviation present.  Cardiovascular: Normal rate, regular rhythm and normal heart sounds.  Exam reveals no gallop and no friction rub.   No murmur heard. Pulmonary/Chest: Effort normal and breath sounds normal. No respiratory distress. She has no wheezes.  Abdominal: Soft. She exhibits no distension. There is no tenderness.  Musculoskeletal: Normal range of motion.  Diffuse paraspinal muscles tenderness to palpation, no bony tenderness, step-offs, or gross abnormality or deformity of spine, moves all extremities  Neurological: She is alert and oriented to person, place, and time.  Sensation and strength intact bilaterally  Skin: Skin is warm. She is not diaphoretic.  Psychiatric: She has a normal mood and affect. Her behavior is normal. Judgment and thought content normal.    ED Course  Procedures (including critical care time)  Labs Reviewed - No data to  display Mm Digital Screening  03/04/2013  *RADIOLOGY REPORT*  Clinical Data: Screening.  DIGITAL BILATERAL SCREENING MAMMOGRAM WITH CAD  Comparison:  Previous exams.  FINDINGS:  ACR Breast Density Category 1: The breast tissue is almost entirely fatty.  In the right breast, calcifications warrant further evaluation with magnified views.  In the left breast, no mass or malignant type calcifications are identified.  Images were processed with CAD.  IMPRESSION: Further evaluation is suggested for calcifications in the right breast.  RECOMMENDATION: Diagnostic mammogram of the right breast. (Code:[id number]-R-45M)  The patient will be contacted regarding the findings, and additional imaging will be scheduled.  BI-RADS CATEGORY 0:  Incomplete.  Need additional imaging evaluation and/or prior mammograms for comparison.   Original  Report Authenticated By: Rolla Plate, M.D.      1. Bilateral leg pain   2. Fall, initial encounter   3. Back pain       MDM  Patient with back pain following a fall while in radiology.  She was sent to radiology by family practice, who would like her to be have bilateral DVT studies completed.  They have requested that I add these to her ED visit.  Patient give 2mg  IM dilaudid for pain.  Will obtain plain films, and reassess.  Patient is very anxious and distressed, but she suffers from anxiety.  I believe her to be more nervous and upset, than injured.  5:04 PM Discussed the patient with Dr. Meredith Pel, who asks that I have the patient increase her lasix to 60mg  daily.  She will call Dr. Meredith Pel tomorrow.        Roxy Horseman, PA-C 03/05/13 1705

## 2013-03-05 NOTE — ED Notes (Signed)
Pt returned from radiology.

## 2013-03-05 NOTE — Assessment & Plan Note (Signed)
BP Readings from Last 3 Encounters:  03/05/13 132/71  03/05/13 111/67  12/04/12 114/75    Lab Results  Component Value Date   NA 137 03/05/2013   K 4.4 03/05/2013   CREATININE 0.63 03/05/2013    Assessment: Blood pressure control: controlled Progress toward BP goal:  at goal Comments: Blood pressure is at goal on enalapril 5 mg daily and furosemide 40 mg daily,  Plan: Medications:  continue current medications Educational resources provided: brochure Self management tools provided: home blood pressure logbook

## 2013-03-05 NOTE — Assessment & Plan Note (Signed)
Assessment: Patient has spinal stenosis and chronic severe low back pain with sciatica.  She has not yet followed up with her neurosurgeon Dr. Lovell Sheehan since patient canceled her planned back surgery last fall.  Her pain appears to be reasonably well-controlled on her current regimen of tramadol 50 mg every 6 hours as needed for pain, carisoprodol 350 mg 4 times a day as needed for muscle spasms, and hydromorphone 2 mg twice a day as needed for pain.  Plan: Continue current medication regimen; I advised patient to follow up with Dr. Lovell Sheehan.

## 2013-03-05 NOTE — Assessment & Plan Note (Signed)
Lab Results  Component Value Date   TSH 1.553 07/31/2012    Assessment: Patient has no symptoms of thyroid dysfunction on levothyroxine 75 mcg daily.  Plan: Will check a TSH today; continue levothyroxine 75 mcg daily pending the result.

## 2013-03-06 ENCOUNTER — Encounter: Payer: Self-pay | Admitting: Radiation Oncology

## 2013-03-06 ENCOUNTER — Other Ambulatory Visit: Payer: Self-pay | Admitting: *Deleted

## 2013-03-06 ENCOUNTER — Ambulatory Visit (INDEPENDENT_AMBULATORY_CARE_PROVIDER_SITE_OTHER): Payer: Medicare HMO | Admitting: Radiation Oncology

## 2013-03-06 VITALS — BP 100/60 | HR 82 | Temp 97.7°F | Ht 61.0 in

## 2013-03-06 DIAGNOSIS — R609 Edema, unspecified: Secondary | ICD-10-CM

## 2013-03-06 DIAGNOSIS — R600 Localized edema: Secondary | ICD-10-CM

## 2013-03-06 DIAGNOSIS — M8700 Idiopathic aseptic necrosis of unspecified bone: Secondary | ICD-10-CM

## 2013-03-06 MED ORDER — FUROSEMIDE 20 MG PO TABS: 60.0000 mg | ORAL_TABLET | Freq: Every day | ORAL | Status: DC

## 2013-03-06 MED ORDER — HYDROMORPHONE HCL 2 MG PO TABS: 2.0000 mg | ORAL_TABLET | Freq: Two times a day (BID) | ORAL | Status: DC | PRN

## 2013-03-06 NOTE — Patient Instructions (Addendum)
Please begin taking your new dose of lasix, which has been increased to 60mg  daily.  Use the prescription you were provided for compression stockings. You will be measured for the appropriate size and then given a pair of stockings. Please use these every day for relief of your symptoms. Also, keep your legs elevated at all times will sitting or lying down, as we discussed during your visit today.   If your symptoms are not improved in 1 week, please call the clinic for a follow-up appointment.   The clinic will be closed tomorrow and over the weekend--please go to urgent care or the ED if your symptoms worsen.

## 2013-03-06 NOTE — Telephone Encounter (Signed)
Pt called and stated is in pain from feet,legs,shoulders and back from fall [place] 03/05/13. Meds from ER not helping pain. Pt aware an appt 03/06/13 2:15PM Dr Lavena Bullion - will try to find transportation. Stanton Kidney Tarquin Welcher RN 03/06/13 11:30AM

## 2013-03-06 NOTE — Progress Notes (Signed)
Subjective:    Patient ID: Mary Gray, female    DOB: 1961/09/05, 52 y.o.   MRN: [id number]  HPI Patient is a 52 year old woman with PMH as described below presents to clinic today for ED followup and to reassess her recent complaints of bilateral lower extremity swelling. She was seen on 03/05/2013 by Dr. Meredith Pel for this issue, who ordered a lower extremity Doppler to rule out DVT. Doppler was negative for DVT, however patient states she sustained a fall while in the vascular lab and was thus transported to the ED. Patient had plain films of the cervical spine, lumbar spine, right shoulder and chest, all of which were unrevealing of acute pathology. Plain film of the sacrum/coccyx was likewise unrevealing of acute pathology however there was an incidental finding of bilateral femoral head AVN noted. She denies any pain persistent from her fall, and states that the only pain that is worsened over her baseline is that associated with her lower extremity edema.   Review of Systems  Constitutional: Negative for activity change.  HENT: Negative.   Eyes: Negative.   Respiratory: Negative for cough and shortness of breath.   Cardiovascular: Positive for leg swelling (bilateral). Negative for chest pain.  Gastrointestinal: Negative.   Endocrine: Negative.   Genitourinary: Negative.   Musculoskeletal: Positive for back pain.  Skin: Negative.   Neurological: Negative for light-headedness and headaches.  Hematological: Negative.   Psychiatric/Behavioral: Negative.    Current Outpatient Medications: Current Outpatient Prescriptions  Medication Sig Dispense Refill  . albuterol (PROVENTIL HFA;VENTOLIN HFA) 108 (90 BASE) MCG/ACT inhaler Inhale 2 puffs into the lungs every 6 (six) hours as needed. For shortness of breath      . ALPRAZolam (XANAX) 1 MG tablet Take 1 mg by mouth 4 (four) times daily as needed. For anxiety      . amitriptyline (ELAVIL) 25 MG tablet Take 25 mg by mouth At bedtime.      Marland Kitchen  aspirin 81 MG chewable tablet Chew 81 mg by mouth daily.       . carisoprodol (SOMA) 350 MG tablet TAKE 1 TABLET BY MOUTH 4 TIMES A DAY AS NEEDED FOR MUSCLE SPASMS  120 tablet  2  . docusate sodium (COLACE) 100 MG capsule Take 100 mg by mouth 2 (two) times daily.       . enalapril (VASOTEC) 5 MG tablet TAKE 1 TABLET EVERY DAY  30 tablet  6  . erythromycin ethylsuccinate (EES) 400 MG tablet Take 400 mg by mouth 2 (two) times daily.      . fluticasone (FLONASE) 50 MCG/ACT nasal spray Place 2 sprays into the nose daily as needed. For allergies      . fluticasone (FLOVENT DISKUS) 50 MCG/BLIST diskus inhaler Inhale 1 puff into the lungs 2 (two) times daily.      . furosemide (LASIX) 20 MG tablet Take 3 tablets (60 mg total) by mouth daily.  90 tablet  1  . glucose blood (ACCU-CHEK COMPACT TEST DRUM) test strip Use to Check Blood Glucose Daily.  ICD Code: 250.00, not insulin dependent  100 each  3  . HYDROmorphone (DILAUDID) 2 MG tablet Take 1 tablet (2 mg total) by mouth 2 (two) times daily as needed for pain.  60 tablet  0  . ipratropium (ATROVENT HFA) 17 MCG/ACT inhaler Inhale 2 puffs into the lungs 4 (four) times daily as needed. For wheezing      . KRISTALOSE 20 G packet Take 20 g by mouth 2 (two) times daily  as needed.      Mary Gray Devices (ACCU-CHEK SOFTCLIX) lancets Use to Check Blood Glucose Daily.  ICD Code: 250.00  100 each  3  . levothyroxine (SYNTHROID) 75 MCG tablet Take 75 mcg by mouth daily.      . metFORMIN (GLUCOPHAGE-XR) 500 MG 24 hr tablet Take 3 tablets (1,500 mg total) by mouth daily.  90 tablet  6  . methocarbamol (ROBAXIN) 500 MG tablet Take 1 tablet (500 mg total) by mouth 2 (two) times daily.  20 tablet  0  . NEXIUM 40 MG capsule TAKE ONE CAPSULE EVERY DAY  31 capsule  6  . nystatin (MYCOSTATIN/NYSTOP) 100000 UNIT/GM POWD APPLY TO AFFECTED AREA TWICE A DAY FOR 10 DAYS  60 g  0  . polyethylene glycol (MIRALAX / GLYCOLAX) packet Take 17 g by mouth daily as needed. For regularity       . potassium chloride (KLOR-CON M10) 10 MEQ tablet Take 2 tablets (20 mEq total) by mouth 2 (two) times daily.  120 tablet  2  . rOPINIRole (REQUIP) 3 MG tablet Take 3 mg by mouth at bedtime.      . rosuvastatin (CRESTOR) 10 MG tablet Take 10 mg by mouth daily.      . sertraline (ZOLOFT) 100 MG tablet Take 100 mg by mouth daily.      . solifenacin (VESICARE) 10 MG tablet Take 10 mg by mouth daily.        . traMADol (ULTRAM) 50 MG tablet Take 1 tablet (50 mg total) by mouth every 6 (six) hours as needed for pain. Can cause drowsiness, do not drive or operate heavy machinery.  60 tablet  0  . traMADol (ULTRAM) 50 MG tablet Take 1 tablet (50 mg total) by mouth every 6 (six) hours as needed for pain.  15 tablet  0  . zolpidem (AMBIEN) 10 MG tablet Take 10 mg by mouth at bedtime as needed. For sleep       No current facility-administered medications for this visit.    Allergies: Allergies  Allergen Reactions  . Codeine Sulfate Other (See Comments)    Confusion, anxiety, fatigue  . Meperidine And Related Nausea And Vomiting  . Minocycline Hcl Nausea And Vomiting    Hospitalized for vomiting along with allergic reaction  . Penicillins Other (See Comments)    unknown  . Propoxyphene-Acetaminophen Other (See Comments)    confused  . Tetracycline Hcl Other (See Comments)    vomiting  . Vicodin (Hydrocodone-Acetaminophen) Other (See Comments)    confusion     Past Medical History: Past Medical History  Diagnosis Date  . Diabetes mellitus, type 2   . Hypertension   . Unspecified asthma   . Anxiety state, unspecified   . Hypercholesteremia   . Hypothyroidism   . Obesity   . GERD (gastroesophageal reflux disease)   . Iron deficiency anemia   . DDD (degenerative disc disease), lumbar   . Spinal stenosis   . Low back pain   . Degeneration of cervical intervertebral disc   . Left knee pain     Following fall 11/15/2009.  Marland Kitchen Acne rosacea   . Heme positive stool     EGD on  04/08/2010 by Dr. Ewing Schlein showed chronic gastritis and a few gastric polyps; pathology showed a fundic gland polyp.  Colonoscopy on 04/08/2010 showed small external and internal hemorrhoids, and a few benign-appearing diminutive polyps in the rectum, the distal sigmoid colon, and in the distal descending colon; pathology showed  hyperplastic polyps.   . Hematochezia   . Irregular menses   . Vagina bleeding   . Atrophic vaginitis   . Viral warts, unspecified     Right hand.  . Chronic interstitial cystitis     Followed by Dr. Marcine Matar  . Elevated transaminase level     Mild elevation, AST=48, ALT=51 on 02/01/07.  . Allergic rhinitis   . Carpal tunnel syndrome   . Atypical chest pain 12/11/2008    Normal stress nuclear study 01/12/2009 by Dr. Peter Sw[place]il[place]  . Bilateral leg edema     Past Surgical History: Past Surgical History  Procedure Laterality Date  . Carpal tunnel release  05/08/2000    By Dr. Katy Fitch. Sypher, Montez Hageman.    Family History: Family History  Problem Relation Age of Onset  . Heart attack Father 79  . Breast cancer Neg Hx   . Colon cancer Neg Hx   . Diabetes Mother   . Hypertension Mother     Social History: History   Social History  . Marital Status: Single    Spouse Name: N/A    Number of Children: N/A  . Years of Education: N/A   Occupational History  . Not on file.   Social History Main Topics  . Smoking status: Former Smoker -- 1.00 packs/day for 15 years    Types: Cigarettes    Quit date: 01/18/1996  . Smokeless tobacco: Never Used  . Alcohol Use: No  . Drug Use: No  . Sexually Active: Not on file   Other Topics Concern  . Not on file   Social History Narrative  . No narrative on file     Vital Signs: Blood pressure 100/60, pulse 82, temperature 97.7 F (36.5 C), temperature source Oral, height 5\' 1"  (1.549 m), weight 0 lb (0 kg), last menstrual period 01/17/2009, SpO2 97.00%.      Objective:   Physical Exam  Constitutional: She  is oriented to person, place, and time.  Sitting comfortable in wheelchair in no distress.   HENT:  Head: Normocephalic and atraumatic.  Eyes: Conjunctivae are normal. Pupils are equal, round, and reactive to light. No scleral icterus.  Neck: Normal range of motion. Neck supple. No tracheal deviation present.  Abdominal: Soft. Bowel sounds are normal. She exhibits no distension. There is no tenderness.  Musculoskeletal: Normal range of motion.  Symmetric non-pitting edema of bilateral lower extremities below the knee. Circumferential erythema of BLE extending distally from mid-tibia level. TTP in pretibial area of BLE.   Neurological: She is alert and oriented to person, place, and time. No cranial nerve deficit.  Skin: Skin is warm and dry. No erythema.  Psychiatric: She has a normal mood and affect. Her behavior is normal.          Assessment & Plan:

## 2013-03-06 NOTE — Telephone Encounter (Signed)
Refill printed and signed - nurse to complete. 

## 2013-03-07 DIAGNOSIS — M8700 Idiopathic aseptic necrosis of unspecified bone: Secondary | ICD-10-CM | POA: Insufficient documentation

## 2013-03-07 NOTE — Assessment & Plan Note (Signed)
BLE doppler u/s was negative for DVT. ProBNP on 03/05/2013 was within normal limits and pt had a 2d echo in 04/2012 with EF 60-65%, thus CHF is very unlikely. Edema and erythema have been bilateral and symmetric since initial onset and patient is afebrile with no leukocytosis, thus suspicion for cellulitis remains low. TSH wnl thus unlikely to be related to myxedema. In the context of the patient's obesity and with the aforementioned etiologies of low probability, the patient's symptoms are most consistent with chronic venous insufficiency.  - pt instructed to elevate lower extremities and to avoid dependent positioning - compression stockings - consider LE duplex U/S with pulsed doppler to further evaluate diagnosis if patient's symptoms do not improve or if she develops LE ulcers

## 2013-03-07 NOTE — Assessment & Plan Note (Signed)
On cursory review of patient's extensive medical and imaging history, no prior diagnosis of this issue was identified. No complaints of hip pain on today's visit. Will defer future management to PCP.

## 2013-03-11 NOTE — Progress Notes (Signed)
Case discussed with Dr. Lavena Bullion at the time of the visit. We reviewed the resident's history and exam and pertinent patient test results. I agree with the assessment, diagnosis and plan of care documented in the resident's note.  As her bilateral femoral head AVN is currently asymptomatic no further intervention is required at this time.  We will follow clinically for development of symptoms.

## 2013-03-12 ENCOUNTER — Ambulatory Visit: Payer: Medicare HMO | Admitting: Radiation Oncology

## 2013-03-12 ENCOUNTER — Emergency Department (HOSPITAL_COMMUNITY)
Admission: EM | Admit: 2013-03-12 | Discharge: 2013-03-12 | Disposition: A | Discharge status: Home / Self Care | Payer: Medicare HMO | Attending: Internal Medicine | Admitting: Internal Medicine

## 2013-03-12 ENCOUNTER — Encounter (HOSPITAL_COMMUNITY): Payer: Self-pay | Admitting: Emergency Medicine

## 2013-03-12 DIAGNOSIS — F411 Generalized anxiety disorder: Secondary | ICD-10-CM | POA: Insufficient documentation

## 2013-03-12 DIAGNOSIS — Z79899 Other long term (current) drug therapy: Secondary | ICD-10-CM | POA: Insufficient documentation

## 2013-03-12 DIAGNOSIS — J45909 Unspecified asthma, uncomplicated: Secondary | ICD-10-CM | POA: Insufficient documentation

## 2013-03-12 DIAGNOSIS — Z8719 Personal history of other diseases of the digestive system: Secondary | ICD-10-CM | POA: Insufficient documentation

## 2013-03-12 DIAGNOSIS — M542 Cervicalgia: Secondary | ICD-10-CM | POA: Insufficient documentation

## 2013-03-12 DIAGNOSIS — E039 Hypothyroidism, unspecified: Secondary | ICD-10-CM | POA: Insufficient documentation

## 2013-03-12 DIAGNOSIS — I10 Essential (primary) hypertension: Secondary | ICD-10-CM | POA: Insufficient documentation

## 2013-03-12 DIAGNOSIS — Z862 Personal history of diseases of the blood and blood-forming organs and certain disorders involving the immune mechanism: Secondary | ICD-10-CM | POA: Insufficient documentation

## 2013-03-12 DIAGNOSIS — I872 Venous insufficiency (chronic) (peripheral): Secondary | ICD-10-CM | POA: Insufficient documentation

## 2013-03-12 DIAGNOSIS — Z880 Allergy status to penicillin: Secondary | ICD-10-CM | POA: Insufficient documentation

## 2013-03-12 DIAGNOSIS — E669 Obesity, unspecified: Secondary | ICD-10-CM | POA: Insufficient documentation

## 2013-03-12 DIAGNOSIS — E785 Hyperlipidemia, unspecified: Secondary | ICD-10-CM | POA: Insufficient documentation

## 2013-03-12 DIAGNOSIS — R6883 Chills (without fever): Secondary | ICD-10-CM | POA: Insufficient documentation

## 2013-03-12 DIAGNOSIS — M2550 Pain in unspecified joint: Secondary | ICD-10-CM | POA: Insufficient documentation

## 2013-03-12 DIAGNOSIS — Z87891 Personal history of nicotine dependence: Secondary | ICD-10-CM | POA: Insufficient documentation

## 2013-03-12 DIAGNOSIS — Z8669 Personal history of other diseases of the nervous system and sense organs: Secondary | ICD-10-CM | POA: Insufficient documentation

## 2013-03-12 DIAGNOSIS — I878 Other specified disorders of veins: Secondary | ICD-10-CM

## 2013-03-12 DIAGNOSIS — K219 Gastro-esophageal reflux disease without esophagitis: Secondary | ICD-10-CM | POA: Insufficient documentation

## 2013-03-12 DIAGNOSIS — R5381 Other malaise: Secondary | ICD-10-CM | POA: Insufficient documentation

## 2013-03-12 DIAGNOSIS — Z872 Personal history of diseases of the skin and subcutaneous tissue: Secondary | ICD-10-CM | POA: Insufficient documentation

## 2013-03-12 DIAGNOSIS — Z8742 Personal history of other diseases of the female genital tract: Secondary | ICD-10-CM | POA: Insufficient documentation

## 2013-03-12 DIAGNOSIS — R238 Other skin changes: Secondary | ICD-10-CM | POA: Insufficient documentation

## 2013-03-12 DIAGNOSIS — Z8619 Personal history of other infectious and parasitic diseases: Secondary | ICD-10-CM | POA: Insufficient documentation

## 2013-03-12 DIAGNOSIS — Z8739 Personal history of other diseases of the musculoskeletal system and connective tissue: Secondary | ICD-10-CM | POA: Insufficient documentation

## 2013-03-12 DIAGNOSIS — Z7982 Long term (current) use of aspirin: Secondary | ICD-10-CM | POA: Insufficient documentation

## 2013-03-12 DIAGNOSIS — E119 Type 2 diabetes mellitus without complications: Secondary | ICD-10-CM | POA: Insufficient documentation

## 2013-03-12 DIAGNOSIS — R609 Edema, unspecified: Secondary | ICD-10-CM | POA: Insufficient documentation

## 2013-03-12 LAB — CBC WITH DIFFERENTIAL/PLATELET
Basophils Relative: 0 % (ref 0–1)
HCT: 32.6 % — ABNORMAL LOW (ref 36.0–46.0)
Hemoglobin: 11.2 g/dL — ABNORMAL LOW (ref 12.0–15.0)
Lymphocytes Relative: 23 % (ref 12–46)
Lymphs Abs: 1.6 10*3/uL (ref 0.7–4.0)
MCHC: 34.4 g/dL (ref 30.0–36.0)
Monocytes Absolute: 0.4 10*3/uL (ref 0.1–1.0)
Monocytes Relative: 6 % (ref 3–12)
Neutro Abs: 4.8 10*3/uL (ref 1.7–7.7)
Neutrophils Relative %: 66 % (ref 43–77)
RBC: 3.86 MIL/uL — ABNORMAL LOW (ref 3.87–5.11)

## 2013-03-12 LAB — BASIC METABOLIC PANEL
BUN: 10 mg/dL (ref 6–23)
CO2: 29 mEq/L (ref 19–32)
Chloride: 94 mEq/L — ABNORMAL LOW (ref 96–112)
Creatinine, Ser: 0.61 mg/dL (ref 0.50–1.10)
GFR calc Af Amer: >90 mL/min (ref >90)
Glucose, Bld: 155 mg/dL — ABNORMAL HIGH (ref 70–99)
Potassium: 3.6 mEq/L (ref 3.5–5.1)

## 2013-03-12 MED ORDER — HYDROMORPHONE HCL PF 1 MG/ML IJ SOLN
0.5000 mg | Freq: Once | INTRAMUSCULAR | Status: AC
  Administered 2013-03-12: 0.5 mg via INTRAVENOUS
  Filled 2013-03-12: qty 1

## 2013-03-12 MED ORDER — ONDANSETRON HCL 4 MG/2ML IJ SOLN
4.0000 mg | Freq: Once | INTRAMUSCULAR | Status: AC
  Administered 2013-03-12: 4 mg via INTRAVENOUS
  Filled 2013-03-12: qty 2

## 2013-03-12 MED ORDER — AMITRIPTYLINE HCL 25 MG PO TABS: 50.0000 mg | ORAL_TABLET | Freq: Every day | ORAL | Status: DC

## 2013-03-12 MED ORDER — VANCOMYCIN HCL IN DEXTROSE 1-5 GM/200ML-% IV SOLN
1000.0000 mg | Freq: Once | INTRAVENOUS | Status: AC
  Administered 2013-03-12: 1000 mg via INTRAVENOUS
  Filled 2013-03-12: qty 200

## 2013-03-12 MED ORDER — HYDROXYZINE HCL 10 MG PO TABS: 10.0000 mg | ORAL_TABLET | Freq: Three times a day (TID) | ORAL | Status: DC | PRN

## 2013-03-12 NOTE — Consult Note (Signed)
Medical Student Consult Note Date: 03/12/2013  Patient name: Mary Gray Medical record number: [id number] Date of birth: July 09, 1961 Age: 52 y.o. Gender: female PCP: Farley Ly, MD  Consult Service: Internal Medicine Teaching Service  Attending physician: Blanch Media, MD     Chief Complaint: leg swelling  History of Present Illness: Patient is a 52 year old female with a past medical history significant for DM, diabetic neuropathy, anxiety, degenerative disc disease, spinal stenosis, and chronic bilateral leg edema, who presents with worsened leg swelling and redness bilaterally. Patient had a scheduled appointment in the IM clinic this morning, but she was unable to be seen (possibly late to appt), so she came to the ED to get checked out. Patient was last seen in clinic about one week ago for a similar complaint of leg swelling, and at that time, her leg redness was also present. She complains of a sensation of tightness and burning in her legs. She has been on her feet more recently taking care of her mother. Of note, one week ago, she went to her family physician Dr. Meredith Pel for a venous doppler, which was negative for DVT. She reported she fell out of the wheelchair during this visit, and since then, she has had right neck and shoulder pain. She complains that the redness has tracked up her legs, but similar erythema, up to mid-tibia, was noted in previous clinic note on 4/17. She reports that raising her legs helps the leg tightness, but benadryl does not help. She endorses one episode of chills, mild abdominal pain, and some nausea, but she denies fevers, shortness of breath, chest pain, vomiting and diarrhea. Patient reports that her DM has been less controlled recently (self-reported increase in A1C ~7). She has not been working outside or walking in the woods recently, and there has been no recent trauma to her legs. She has been compliant with her furosemide 60mg  daily.   In the  ED, patient received a dose of vancomycin.  Meds: Current Outpatient Rx  Name  Route  Sig  Dispense  Refill  . albuterol (PROVENTIL HFA;VENTOLIN HFA) 108 (90 BASE) MCG/ACT inhaler   Inhalation   Inhale 2 puffs into the lungs every 6 (six) hours as needed. For shortness of breath         . ALPRAZolam (XANAX) 1 MG tablet   Oral   Take 1 mg by mouth 4 (four) times daily as needed. For anxiety         . amitriptyline (ELAVIL) 25 MG tablet   Oral   Take 25 mg by mouth At bedtime.         Marland Kitchen aspirin 81 MG chewable tablet   Oral   Chew 81 mg by mouth daily.          . carisoprodol (SOMA) 350 MG tablet      TAKE 1 TABLET BY MOUTH 4 TIMES A DAY AS NEEDED FOR MUSCLE SPASMS   120 tablet   2   . enalapril (VASOTEC) 5 MG tablet      TAKE 1 TABLET EVERY DAY   30 tablet   6   . erythromycin ethylsuccinate (EES) 400 MG tablet   Oral   Take 400 mg by mouth 2 (two) times daily. Maintenance for Rosacea         . fluticasone (FLONASE) 50 MCG/ACT nasal spray   Nasal   Place 2 sprays into the nose daily as needed. For allergies         .  fluticasone (FLOVENT DISKUS) 50 MCG/BLIST diskus inhaler   Inhalation   Inhale 1 puff into the lungs 2 (two) times daily.         . furosemide (LASIX) 20 MG tablet   Oral   Take 3 tablets (60 mg total) by mouth daily.   90 tablet   1   . HYDROmorphone (DILAUDID) 2 MG tablet   Oral   Take 1 tablet (2 mg total) by mouth 2 (two) times daily as needed for pain.   60 tablet   0   . ipratropium (ATROVENT HFA) 17 MCG/ACT inhaler   Inhalation   Inhale 2 puffs into the lungs 4 (four) times daily as needed. For wheezing         . KRISTALOSE 20 G packet   Oral   Take 20 g by mouth 2 (two) times daily as needed (constipation).          Marland Kitchen levothyroxine (SYNTHROID) 75 MCG tablet   Oral   Take 75 mcg by mouth daily. BRAND NAME         . metFORMIN (GLUCOPHAGE-XR) 500 MG 24 hr tablet   Oral   Take 3 tablets (1,500 mg total) by mouth  daily.   90 tablet   6   . methocarbamol (ROBAXIN) 500 MG tablet   Oral   Take 1 tablet (500 mg total) by mouth 2 (two) times daily.   20 tablet   0   . NEXIUM 40 MG capsule      TAKE ONE CAPSULE EVERY DAY   31 capsule   6   . nystatin (MYCOSTATIN/NYSTOP) 100000 UNIT/GM POWD      APPLY TO AFFECTED AREA TWICE A DAY FOR 10 DAYS   60 g   0   . potassium chloride (KLOR-CON M10) 10 MEQ tablet   Oral   Take 2 tablets (20 mEq total) by mouth 2 (two) times daily.   120 tablet   2   . rOPINIRole (REQUIP) 3 MG tablet   Oral   Take 3 mg by mouth at bedtime.         . rosuvastatin (CRESTOR) 10 MG tablet   Oral   Take 10 mg by mouth daily.         . sertraline (ZOLOFT) 100 MG tablet   Oral   Take 100 mg by mouth daily.         . solifenacin (VESICARE) 10 MG tablet   Oral   Take 10 mg by mouth daily.           . traMADol (ULTRAM) 50 MG tablet   Oral   Take 1 tablet (50 mg total) by mouth every 6 (six) hours as needed for pain.   15 tablet   0   . zolpidem (AMBIEN) 10 MG tablet   Oral   Take 10 mg by mouth at bedtime as needed. For sleep         . glucose blood (ACCU-CHEK COMPACT TEST DRUM) test strip      Use to Check Blood Glucose Daily.  ICD Code: 250.00, not insulin dependent   100 each   3   . Lancet Devices (ACCU-CHEK SOFTCLIX) lancets      Use to Check Blood Glucose Daily.  ICD Code: 250.00   100 each   3     Allergies: Allergies as of 03/12/2013 - Review Complete 03/12/2013  Allergen Reaction Noted  . Codeine sulfate Other (See Comments) 06/27/2006  . Meperidine  and related Nausea And Vomiting 12/30/2011  . Minocycline hcl Nausea And Vomiting 06/27/2006  . Penicillins Other (See Comments) 06/27/2006  . Propoxyphene-acetaminophen Other (See Comments) 06/27/2006  . Tetracycline hcl Other (See Comments) 06/27/2006  . Vicodin (hydrocodone-acetaminophen) Other (See Comments) 04/27/2011   Past Medical History  Diagnosis Date  . Diabetes  mellitus, type 2   . Hypertension   . Unspecified asthma   . Anxiety state, unspecified   . Hypercholesteremia   . Hypothyroidism   . Obesity   . GERD (gastroesophageal reflux disease)   . Iron deficiency anemia   . DDD (degenerative disc disease), lumbar   . Spinal stenosis   . Low back pain   . Degeneration of cervical intervertebral disc   . Left knee pain     Following fall 11/15/2009.  Marland Kitchen Acne rosacea   . Heme positive stool     EGD on 04/08/2010 by Dr. Ewing Schlein showed chronic gastritis and a few gastric polyps; pathology showed a fundic gland polyp.  Colonoscopy on 04/08/2010 showed small external and internal hemorrhoids, and a few benign-appearing diminutive polyps in the rectum, the distal sigmoid colon, and in the distal descending colon; pathology showed hyperplastic polyps.   . Hematochezia   . Irregular menses   . Vagina bleeding   . Atrophic vaginitis   . Viral warts, unspecified     Right hand.  . Chronic interstitial cystitis     Followed by Dr. Marcine Matar  . Elevated transaminase level     Mild elevation, AST=48, ALT=51 on 02/01/07.  . Allergic rhinitis   . Carpal tunnel syndrome   . Atypical chest pain 12/11/2008    Normal stress nuclear study 01/12/2009 by Dr. Peter Swaziland  . Bilateral leg edema    Past Surgical History  Procedure Laterality Date  . Carpal tunnel release  05/08/2000    By Dr. Rashed Edler Fitch. Sypher, Montez Hageman.   Family History  Problem Relation Age of Onset  . Heart attack Father 58  . Breast cancer Neg Hx   . Colon cancer Neg Hx   . Diabetes Mother   . Hypertension Mother    History   Social History  . Marital Status: Single    Spouse Name: N/A    Number of Children: N/A  . Years of Education: N/A   Occupational History  . Not on file.   Social History Main Topics  . Smoking status: Former Smoker -- 1.00 packs/day for 15 years    Types: Cigarettes    Quit date: 01/18/1996  . Smokeless tobacco: Never Used  . Alcohol Use: No  . Drug  Use: No  . Sexually Active: Not on file   Other Topics Concern  . Not on file   Social History Narrative  . No narrative on file    Review of Systems: Pertinent items are noted in HPI.  Physical Exam: Blood pressure 105/45, pulse 81, temperature 97.4 F (36.3 C), temperature source Oral, resp. rate 18, last menstrual period 01/17/2009, SpO2 98.00%. General: alert, cooperative, and in no apparent distress Lungs: clear to ascultation bilaterally, normal work of respiration, no wheezes, rales or rhonchi Heart: regular rate and rhythm, no murmurs, rubs or gallops Extremities: 2+ DP pulses bilaterally, no cyanosis or clubbing; 2+ pitting edema up to below knee bilaterally (swelling slightly worse L>R), mild redness to mid-tibia bilaterally, 3cm scar on L lower leg, extracted toenail on R big toe, scaly skin around toes and webs of feet Neurologic: alert & oriented X3,  cranial nerves grossly II-XII intact, sensation to light touch of lower extremities grossly intact  Lab results: Basic Metabolic Panel:  Recent Labs  16/10/96 1004  NA 136  K 3.6  CL 94*  CO2 29  GLUCOSE 155*  BUN 10  CREATININE 0.61  CALCIUM 9.4   CBC:  Recent Labs  03/12/13 1004  WBC 7.2  NEUTROABS 4.8  HGB 11.2*  HCT 32.6*  MCV 84.5  PLT 211   Urine Drug Screen: Drugs of Abuse     Component Value Date/Time   LABOPIA NEG 12/04/2012 1325   LABOPIA NEG 04/29/2010 0155   COCAINSCRNUR NEG 12/04/2012 1325   LABBENZ PPS 12/04/2012 1325   LABBENZ POS* 04/29/2010 0155   AMPHETMU NEG 04/29/2010 0155   LABBARB NEG 12/04/2012 1325    Urinalysis: No results found for this basename: COLORURINE, APPERANCEUR, LABSPEC, PHURINE, GLUCOSEU, HGBUR, BILIRUBINUR, KETONESUR, PROTEINUR, UROBILINOGEN, NITRITE, LEUKOCYTESUR,  in the last 72 hours  Imaging results:  No results found.  Assessment & Plan: Patient is a 52 year old female with a past medical history significant for DM, diabetic neuropathy, anxiety,  degenerative disc disease, spinal stenosis, and chronic bilateral leg edema, who presents with worsened leg swelling and redness bilaterally. Patient was last seen in clinic about one week ago for a similar complaint of leg swelling and redness.  -Leg swelling: Patient presented with worsened leg swelling and redness bilaterally, although upon review of recent clinic note on 4/17, swelling and erythema is largely unchanged. DVT was ruled out by negative lower extremity venous dopplers on 4/16, and patient has not had shortness of breath, chest pain, or tachycardia. Patient has not flown on airplane recently, nor has she had prolonged periods of inactivity. Acute cellulitis is also unlikely, given that the erythema is largely unchanged, and the patient has been afebrile. Antibiotics are not needed. Echo in 04/2012 was normal with EF 60-65%. Kidney function today is normal. TSH in April was within normal limits. Her medication list was reviewed today, and she is not on any meds well-known to cause leg swelling. Patient most likely has symptoms of venous stasis that have escalated recently with being on her feet more often.  -Recommend compression stockings- Patient should wear as often as possible to minimize venous stasis. -Increase Amitriptyline to 50mg  nightly for control of leg pain. -Start hydroxyzine 10mg  tid prn for itching -f/u Microalbumin/Creatinine ratio -Will follow-up in IM clinic on 4/30, 1:15PM  Recommendations: Patient is cleared for discharge from the ED. We have scheduled a follow-up appointment in IM clinic in 1 week. We recommend that patient wear compression stockings for improved venous flow, increase amitriptyline dosage, and start hydroxyzine.   This is a Psychologist, occupational Note.  The care of the patient was discussed with Dr. Rogelia Boga and the assessment and plan was formulated with their assistance.  Please see their note for official documentation of the patient encounter.    SignedCheryl Flash 03/12/2013, 2:39 PM

## 2013-03-12 NOTE — Consult Note (Signed)
I was present and participated in the entire history and physical exam. I agree with Dr Dorise Hiss that her LE edema and erythema is very unlikely to represent cellulitis and the the risks of ABX are greater than the benefits. The etiology of the erythema likely is the edema (beginning of CVI). The etiology of the edema has been extensively W/U with ECHO, CMP, TSH, dopplers. We will check a microalb as that is the only thing that has not been checked. We obtained stockings for her, will increase her elavil, and Rx hydroxyzine. She will F/U in the [facility].

## 2013-03-12 NOTE — ED Notes (Signed)
Went to administer medications to patient.  Internal medicine at bedside. Unable to get in patient room until internal medicine has left.

## 2013-03-12 NOTE — ED Notes (Signed)
Pt reports fell out of chair on 03/05/2013 and has had pain since. Pt c/o pain to back, B/L legs, right shoulder, right arm and neck pain. Pt also reports since falling experiencing dizziness. Pt presents with swelling to B/L LE, pt currently taking Lasix. Pt has not had her Lasix today. Pt has prescription for pain but was told she can't get it filled until the 24th.

## 2013-03-12 NOTE — ED Notes (Signed)
Iv start unsuccessful attempt x 1.

## 2013-03-12 NOTE — ED Notes (Signed)
Patient ambulated to restroom and tolerated well.  

## 2013-03-12 NOTE — ED Provider Notes (Signed)
History     CSN: [id number]  Arrival date & time 03/12/13  0905   First MD Initiated Contact with Patient 03/12/13 0920      Chief Complaint  Patient presents with  . Pain    (Consider location/radiation/quality/duration/timing/severity/associated sxs/prior treatment) HPI Comments: Dorothea Yow is a 52 y.o. Female with an approximate one week history of bilateral lower leg swelling and pain which has become progressively more swollen and red.   Her pain is constant, deep, aching without relief,  Stating just putting socks on her feet increases the pain.  She denies injury to her feet,  But does still have various pain including low back and neck pain sustained in a fall here last week, which was evaluated then with negative xrays.  She has has chills,  Unaware of fevers.  Her blood glucose level yesterday was 150,  It was not checked today.  She had a doppler US of her lower extremities last week, ordered by her pcp ruling dvt as the source of this pain.  She takes tramadol for her pain which does not relieve her symptoms.     The history is provided by the patient.    Past Medical History  Diagnosis Date  . Diabetes mellitus, type 2   . Hypertension   . Unspecified asthma   . Anxiety state, unspecified   . Hypercholesteremia   . Hypothyroidism   . Obesity   . GERD (gastroesophageal reflux disease)   . Iron deficiency anemia   . DDD (degenerative disc disease), lumbar   . Spinal stenosis   . Low back pain   . Degeneration of cervical intervertebral disc   . Left knee pain     Following fall 11/15/2009.  Marland Kitchen Acne rosacea   . Heme positive stool     EGD on 04/08/2010 by Dr. Ewing Schlein showed chronic gastritis and a few gastric polyps; pathology showed a fundic gland polyp.  Colonoscopy on 04/08/2010 showed small external and internal hemorrhoids, and a few benign-appearing diminutive polyps in the rectum, the distal sigmoid colon, and in the distal descending colon; pathology showed  hyperplastic polyps.   . Hematochezia   . Irregular menses   . Vagina bleeding   . Atrophic vaginitis   . Viral warts, unspecified     Right hand.  . Chronic interstitial cystitis     Followed by Dr. Marcine Matar  . Elevated transaminase level     Mild elevation, AST=48, ALT=51 on 02/01/07.  . Allergic rhinitis   . Carpal tunnel syndrome   . Atypical chest pain 12/11/2008    Normal stress nuclear study 01/12/2009 by Dr. Peter Sw[place]il[place]  . Bilateral leg edema     Past Surgical History  Procedure Laterality Date  . Carpal tunnel release  05/08/2000    By Dr. Katy Fitch. Sypher, Montez Hageman.    Family History  Problem Relation Age of Onset  . Heart attack Father 83  . Breast cancer Neg Hx   . Colon cancer Neg Hx   . Diabetes Mother   . Hypertension Mother     History  Substance Use Topics  . Smoking status: Former Smoker -- 1.00 packs/day for 15 years    Types: Cigarettes    Quit date: 01/18/1996  . Smokeless tobacco: Never Used  . Alcohol Use: No    OB History   Grav Para Term Preterm Abortions TAB SAB Ect Mult Living  Review of Systems  Constitutional: Positive for chills. Negative for fever.  HENT: Positive for neck pain. Negative for congestion, sore throat and neck stiffness.   Eyes: Negative.   Respiratory: Negative for chest tightness and shortness of breath.   Cardiovascular: Negative for chest pain.  Gastrointestinal: Negative for nausea, vomiting and abdominal pain.  Genitourinary: Negative.   Musculoskeletal: Positive for arthralgias. Negative for joint swelling.  Skin: Positive for color change. Negative for rash and wound.  Neurological: Positive for weakness. Negative for dizziness, light-headedness, numbness and headaches.  Psychiatric/Behavioral: Negative.     Allergies  Codeine sulfate; Meperidine and related; Minocycline hcl; Penicillins; Propoxyphene-acetaminophen; Tetracycline hcl; and Vicodin  Home Medications   Current  Outpatient Rx  Name  Route  Sig  Dispense  Refill  . albuterol (PROVENTIL HFA;VENTOLIN HFA) 108 (90 BASE) MCG/ACT inhaler   Inhalation   Inhale 2 puffs into the lungs every 6 (six) hours as needed. For shortness of breath         . ALPRAZolam (XANAX) 1 MG tablet   Oral   Take 1 mg by mouth 4 (four) times daily as needed. For anxiety         . aspirin 81 MG chewable tablet   Oral   Chew 81 mg by mouth daily.          . carisoprodol (SOMA) 350 MG tablet      TAKE 1 TABLET BY MOUTH 4 TIMES A DAY AS NEEDED FOR MUSCLE SPASMS   120 tablet   2   . enalapril (VASOTEC) 5 MG tablet      TAKE 1 TABLET EVERY DAY   30 tablet   6   . erythromycin ethylsuccinate (EES) 400 MG tablet   Oral   Take 400 mg by mouth 2 (two) times daily. Maintenance for Rosacea         . fluticasone (FLONASE) 50 MCG/ACT nasal spray   Nasal   Place 2 sprays into the nose daily as needed. For allergies         . fluticasone (FLOVENT DISKUS) 50 MCG/BLIST diskus inhaler   Inhalation   Inhale 1 puff into the lungs 2 (two) times daily.         . furosemide (LASIX) 20 MG tablet   Oral   Take 3 tablets (60 mg total) by mouth daily.   90 tablet   1   . HYDROmorphone (DILAUDID) 2 MG tablet   Oral   Take 1 tablet (2 mg total) by mouth 2 (two) times daily as needed for pain.   60 tablet   0   . ipratropium (ATROVENT HFA) 17 MCG/ACT inhaler   Inhalation   Inhale 2 puffs into the lungs 4 (four) times daily as needed. For wheezing         . KRISTALOSE 20 G packet   Oral   Take 20 g by mouth 2 (two) times daily as needed (constipation).          Marland Kitchen levothyroxine (SYNTHROID) 75 MCG tablet   Oral   Take 75 mcg by mouth daily. BRAND NAME         . metFORMIN (GLUCOPHAGE-XR) 500 MG 24 hr tablet   Oral   Take 3 tablets (1,500 mg total) by mouth daily.   90 tablet   6   . methocarbamol (ROBAXIN) 500 MG tablet   Oral   Take 1 tablet (500 mg total) by mouth 2 (two) times daily.   20 tablet  0   . NEXIUM 40 MG capsule      TAKE ONE CAPSULE EVERY DAY   31 capsule   6   . nystatin (MYCOSTATIN/NYSTOP) 100000 UNIT/GM POWD      APPLY TO AFFECTED AREA TWICE A DAY FOR 10 DAYS   60 g   0   . potassium chloride (KLOR-CON M10) 10 MEQ tablet   Oral   Take 2 tablets (20 mEq total) by mouth 2 (two) times daily.   120 tablet   2   . rOPINIRole (REQUIP) 3 MG tablet   Oral   Take 3 mg by mouth at bedtime.         . rosuvastatin (CRESTOR) 10 MG tablet   Oral   Take 10 mg by mouth daily.         . sertraline (ZOLOFT) 100 MG tablet   Oral   Take 100 mg by mouth daily.         . solifenacin (VESICARE) 10 MG tablet   Oral   Take 10 mg by mouth daily.           . traMADol (ULTRAM) 50 MG tablet   Oral   Take 1 tablet (50 mg total) by mouth every 6 (six) hours as needed for pain.   15 tablet   0   . zolpidem (AMBIEN) 10 MG tablet   Oral   Take 10 mg by mouth at bedtime as needed. For sleep         . amitriptyline (ELAVIL) 25 MG tablet   Oral   Take 2 tablets (50 mg total) by mouth at bedtime.   60 tablet   0   . glucose blood (ACCU-CHEK COMPACT TEST DRUM) test strip      Use to Check Blood Glucose Daily.  ICD Code: 250.00, not insulin dependent   100 each   3   . hydrOXYzine (ATARAX/VISTARIL) 10 MG tablet   Oral   Take 1 tablet (10 mg total) by mouth 3 (three) times daily as needed for itching.   30 tablet   0   . Lancet Devices (ACCU-CHEK SOFTCLIX) lancets      Use to Check Blood Glucose Daily.  ICD Code: 250.00   100 each   3     BP 120/64  Pulse 82  Temp(Src) 97.4 F (36.3 C) (Oral)  Resp 18  SpO2 98%  LMP 01/17/2009  Physical Exam  Nursing note and vitals reviewed. Constitutional: She appears well-developed and well-nourished.  HENT:  Head: Normocephalic and atraumatic.  Eyes: Conjunctivae are normal.  Neck: Normal range of motion.  Cardiovascular: Normal rate, regular rhythm, normal heart sounds and intact distal pulses.    Pulmonary/Chest: Effort normal and breath sounds normal. She has no wheezes.  Abdominal: Soft. Bowel sounds are normal. There is no tenderness.  Musculoskeletal: Normal range of motion.  Neurological: She is alert.  Skin: Skin is warm and dry. There is erythema.  Confluent erthyema bilateral lower extremities with streaking to bilateral lower medial thighs. Increased warmth.  Bilateral edema to mid tibia.  Psychiatric: She has a normal mood and affect.    ED Course  Procedures (including critical care time)  Labs Reviewed  CBC WITH DIFFERENTIAL - Abnormal; Notable for the following:    RBC 3.86 (*)    Hemoglobin 11.2 (*)    HCT 32.6 (*)    All other components within normal limits  BASIC METABOLIC PANEL - Abnormal; Notable for the following:  Chloride 94 (*)    Glucose, Bld 155 (*)    All other components within normal limits  MICROALBUMIN / CREATININE URINE RATIO   No results found.   1. Peripheral edema   2. Venous stasis       MDM  Pt seen with Dr Estell Harpin who also agrees this is probable cellulitis.  Ordered IV vanc.  Call to teaching service for admission request.  After evaluation by teaching staff,  Plan for patient dispo home,  Felt her sx are due to venous stasis dermatitis.  Compression stockings provided by hospital, as she was prescribed them last week but was unable to afford this prescription.  Additionally additional medicines to be called into her pharmacy by the teaching service. Pt was advised to increase her amitriptyline to 50 mg qhs. Elevation of lower extremities and avoid prolonged standing.        Burgess Amor, PA-C 03/12/13 1639

## 2013-03-12 NOTE — Consult Note (Addendum)
Internal Medicine Teaching Service Resident Consult Note Date: 03/12/2013  Patient name: Mary Gray Medical record number: [id number] Date of birth: 1961-08-05 Age: 52 y.o. Gender: female PCP: Farley Ly, MD  Medical Service:  I have reviewed the note by Cheryl Flash MS 4 and was present during the interview and physical exam.  Please see below for findings, assessment, and plan.  Chief Complaint: bilateral leg swelling and pain  History of Present Illness: Patient is a 52 year old female with a past medical history significant for DM, diabetic neuropathy, anxiety, degenerative disc disease, spinal stenosis presents to the ED with worsened leg swelling and redness bilaterally. She was seen in the clinic one week ago for the same problem and was given a venous doppler. She was negative for clots at that time. She was given prescription for compression stockings which she was unable to afford. She states that she has been taking care of a family member the last week and has been up on her feet more often. She thinks that the swelling goes down at night when she puts them up. They tend to go up throughout the day. She was supposed to atient had a scheduled appointment in the IM clinic this morning, but she was unable to be seen (possibly late to appt), so she came to the ED to get checked out. Patient was last seen in clinic about one week ago for a similar complaint of leg swelling, and at that time, her leg redness was also present. She complains of a sensation of tightness and burning in her legs. She has been on her feet more recently taking care of her mother. Of note, one week ago, she went to her family physician Dr. Meredith Pel for a venous doppler, which was negative for DVT. She reported she fell out of the wheelchair during this visit, and since then, she has had right neck and shoulder pain. She complains that the redness has tracked up her legs, but similar erythema, up to mid-tibia, was noted in  previous clinic note on 4/17. She reports that raising her legs helps the leg tightness, but benadryl does not help. She endorses one episode of chills, mild abdominal pain, and some nausea, but she denies fevers, shortness of breath, chest pain, vomiting and diarrhea. Patient reports that her DM has been less controlled recently (self-reported increase in A1C ~7). She has not been working outside or walking in the woods recently, and there has been no recent trauma to her legs. She has been compliant with her furosemide 60mg  daily.  In the ED, patient received a dose of vancomycin.   Meds:  (Not in a hospital admission)  Allergies: Allergies as of 03/12/2013 - Review Complete 03/12/2013  Allergen Reaction Noted  . Codeine sulfate Other (See Comments) 06/27/2006  . Meperidine and related Nausea And Vomiting 12/30/2011  . Minocycline hcl Nausea And Vomiting 06/27/2006  . Penicillins Other (See Comments) 06/27/2006  . Propoxyphene-acetaminophen Other (See Comments) 06/27/2006  . Tetracycline hcl Other (See Comments) 06/27/2006  . Vicodin (hydrocodone-acetaminophen) Other (See Comments) 04/27/2011    Past Medical History: Medical Student note reviewed  Family History: Medical Student note reviewed  Social History: Medical Student note reviewed  Surgical History: Medical Student note reviewed  Review of System: Medical Student note reviewed  Physical Exam: Blood pressure 105/45, pulse 81, temperature 97.4 F (36.3 C), temperature source Oral, resp. rate 18, last menstrual period 01/17/2009, SpO2 98.00%. General: resting in bed, pleasant HEENT: PERRL, EOMI, no scleral  icterus Cardiac: RRR, no rubs, murmurs or gallops Pulm: clear to auscultation bilaterally, moving normal volumes of air Abd: soft, nontender, nondistended, BS present Ext: warm and well perfused, 2+ edema to the knee bilaterally, some mild erythema without skin breakdown, minimal warmth to the skin Neuro: alert  and oriented X3, cranial nerves II-XII grossly intact   Labs: Reviewed as noted in the Electronic Record  Imaging: Reviewed as noted in the Electronic Record  Assessment & Plan by Problem: Patient is a 52 year old female with a past medical history significant for DM, diabetic neuropathy, anxiety, degenerative disc disease, spinal stenosis, and chronic bilateral leg edema, who presents with worsened leg swelling and redness bilaterally. Patient was last seen in clinic about one week ago for a similar complaint of leg swelling and redness.   -Leg swelling: Patient presented with similar leg swelling and redness bilaterally and do not feel she meets criteria for cellulitis at this time. She has been evaluated for DVT. This does sound more suspicious for chronic venous stasis and compression stockings are to be delivered to her prior to leaving the ED to be worn all the time at home. She has had echo normal back in June 2013, TSH normal within a month, U/A negative within 9 months although will order additional microalbumin:creatinine ratio to look for proteinuria. Patient has not flown on airplane recently, nor has she had prolonged periods of inactivity. The patient has been afebrile and without leukocytosis. Kidney function today is normal. Her medication list was reviewed today, and she is not on any meds well-known to cause leg swelling. -Recommend compression stockings and will provide in the ED  -Increase Amitriptyline to 50mg  nightly for control of leg pain.  -Start hydroxyzine 10mg  tid prn for itching  -f/u Microalbumin/Creatinine ratio  -Will follow-up in IM clinic on 4/30, 1:15PM   Recommendations:  After discussing with attending the patient is deemed stable for discharge home with close follow up in the clinic next week. We would recommend that we increase amitriptyline to 50 mg nightly and add hydroxyzine to prevent itching (which may convert this to cellulitis). The care of this case  was discussed with Dr. Rogelia Boga and she did see and evaluate the patient as well.   Return to clinic on 03/19/13 at 1:15 PM with Dr. Lavena Bullion  Signed: Genella Mech 03/12/2013, 3:59 PM  PGY-2

## 2013-03-13 NOTE — ED Provider Notes (Signed)
Medical screening examination/treatment/procedure(s) were performed by non-physician practitioner and as supervising physician I was immediately available for consultation/collaboration.   Deshanta Lady L Daytona Hedman, MD 03/13/13 1149 

## 2013-03-15 ENCOUNTER — Other Ambulatory Visit: Payer: Self-pay | Admitting: Internal Medicine

## 2013-03-15 DIAGNOSIS — K219 Gastro-esophageal reflux disease without esophagitis: Secondary | ICD-10-CM

## 2013-03-15 DIAGNOSIS — J45909 Unspecified asthma, uncomplicated: Secondary | ICD-10-CM

## 2013-03-19 ENCOUNTER — Ambulatory Visit (INDEPENDENT_AMBULATORY_CARE_PROVIDER_SITE_OTHER): Payer: Medicare HMO | Admitting: Radiation Oncology

## 2013-03-19 ENCOUNTER — Encounter: Payer: Self-pay | Admitting: Radiation Oncology

## 2013-03-19 VITALS — BP 126/80 | HR 97 | Temp 98.4°F | Ht 61.0 in | Wt 231.9 lb

## 2013-03-19 DIAGNOSIS — E1142 Type 2 diabetes mellitus with diabetic polyneuropathy: Secondary | ICD-10-CM

## 2013-03-19 DIAGNOSIS — E119 Type 2 diabetes mellitus without complications: Secondary | ICD-10-CM

## 2013-03-19 DIAGNOSIS — M8700 Idiopathic aseptic necrosis of unspecified bone: Secondary | ICD-10-CM

## 2013-03-19 DIAGNOSIS — E1149 Type 2 diabetes mellitus with other diabetic neurological complication: Secondary | ICD-10-CM

## 2013-03-19 DIAGNOSIS — E1140 Type 2 diabetes mellitus with diabetic neuropathy, unspecified: Secondary | ICD-10-CM

## 2013-03-19 DIAGNOSIS — R600 Localized edema: Secondary | ICD-10-CM

## 2013-03-19 DIAGNOSIS — R609 Edema, unspecified: Secondary | ICD-10-CM

## 2013-03-19 LAB — GLUCOSE, CAPILLARY: Glucose-Capillary: 195 mg/dL — ABNORMAL HIGH (ref 70–99)

## 2013-03-19 NOTE — Progress Notes (Signed)
Subjective:    Patient ID: Mary Gray, female    DOB: 11-Jun-1961, 52 y.o.   MRN: [id number]  HPI Patient presents to clinic today with complaints of persistent lower extremity edema and "tingling" pain. She states that her LE edema has persisted unchanged since her previous clinic visit, and admits that she has not filled her prescription for compression stockings nor has she consistently elevated her feet while at rest ( although she states that when she does elevate them she has significant relief in her symptoms). She presented to the ED with these complaints several days ago after she was unable to attend her clinic appointment secondary to violation of the late policy. She was given a dose of vancomycin in the ED due to concerns for cellulitis, however she was evaluated by the inpatient internal medicine team who did not feel that inpatient admission or continued antibiotic therapy was appropriate as cellulitis remained an unlikely diagnosis.   Mary Gray has no new complaints today. She states she has not filled her hydroxyzine or amitriptyline prescriptions given to her during her ED visit.   Review of Systems  All other systems reviewed and are negative.    Current Outpatient Medications: Current Outpatient Prescriptions  Medication Sig Dispense Refill  . albuterol (PROAIR HFA) 108 (90 BASE) MCG/ACT inhaler Inhale 1-2 puffs into the lungs every 6 (six) hours as needed for shortness of breath.  18 g  11  . ALPRAZolam (XANAX) 1 MG tablet Take 1 mg by mouth 4 (four) times daily as needed. For anxiety      . amitriptyline (ELAVIL) 25 MG tablet Take 2 tablets (50 mg total) by mouth at bedtime.  60 tablet  0  . aspirin 81 MG chewable tablet Chew 81 mg by mouth daily.       . carisoprodol (SOMA) 350 MG tablet TAKE 1 TABLET BY MOUTH 4 TIMES A DAY AS NEEDED FOR MUSCLE SPASMS  120 tablet  2  . enalapril (VASOTEC) 5 MG tablet TAKE 1 TABLET EVERY DAY  30 tablet  6  . erythromycin ethylsuccinate  (EES) 400 MG tablet Take 400 mg by mouth 2 (two) times daily. Maintenance for Rosacea      . esomeprazole (NEXIUM) 40 MG capsule Take 1 capsule (40 mg total) by mouth daily before breakfast.  31 capsule  11  . fluticasone (FLONASE) 50 MCG/ACT nasal spray Place 2 sprays into the nose daily as needed. For allergies      . fluticasone (FLOVENT DISKUS) 50 MCG/BLIST diskus inhaler Inhale 1 puff into the lungs 2 (two) times daily.      . furosemide (LASIX) 20 MG tablet Take 3 tablets (60 mg total) by mouth daily.  90 tablet  1  . glucose blood (ACCU-CHEK COMPACT TEST DRUM) test strip Use to Check Blood Glucose Daily.  ICD Code: 250.00, not insulin dependent  100 each  3  . HYDROmorphone (DILAUDID) 2 MG tablet Take 1 tablet (2 mg total) by mouth 2 (two) times daily as needed for pain.  60 tablet  0  . hydrOXYzine (ATARAX/VISTARIL) 10 MG tablet Take 1 tablet (10 mg total) by mouth 3 (three) times daily as needed for itching.  30 tablet  0  . ipratropium (ATROVENT HFA) 17 MCG/ACT inhaler Inhale 2 puffs into the lungs 4 (four) times daily as needed. For wheezing      . KRISTALOSE 20 G packet Take 20 g by mouth 2 (two) times daily as needed (constipation).       Marland Kitchen  Lancet Devices (ACCU-CHEK SOFTCLIX) lancets Use to Check Blood Glucose Daily.  ICD Code: 250.00  100 each  3  . levothyroxine (SYNTHROID) 75 MCG tablet Take 75 mcg by mouth daily. BRAND NAME      . metFORMIN (GLUCOPHAGE-XR) 500 MG 24 hr tablet Take 3 tablets (1,500 mg total) by mouth daily.  90 tablet  6  . methocarbamol (ROBAXIN) 500 MG tablet Take 1 tablet (500 mg total) by mouth 2 (two) times daily.  20 tablet  0  . nystatin (MYCOSTATIN/NYSTOP) 100000 UNIT/GM POWD APPLY TO AFFECTED AREA TWICE A DAY FOR 10 DAYS  60 g  0  . potassium chloride (KLOR-CON M10) 10 MEQ tablet Take 2 tablets (20 mEq total) by mouth 2 (two) times daily.  120 tablet  2  . rOPINIRole (REQUIP) 3 MG tablet Take 3 mg by mouth at bedtime.      . rosuvastatin (CRESTOR) 10 MG  tablet Take 10 mg by mouth daily.      . sertraline (ZOLOFT) 100 MG tablet Take 100 mg by mouth daily.      . solifenacin (VESICARE) 10 MG tablet Take 10 mg by mouth daily.        . traMADol (ULTRAM) 50 MG tablet Take 1 tablet (50 mg total) by mouth every 6 (six) hours as needed for pain.  15 tablet  0  . zolpidem (AMBIEN) 10 MG tablet Take 10 mg by mouth at bedtime as needed. For sleep       No current facility-administered medications for this visit.    Allergies: Allergies  Allergen Reactions  . Codeine Sulfate Other (See Comments)    Confusion, anxiety, fatigue  . Meperidine And Related Nausea And Vomiting  . Minocycline Hcl Nausea And Vomiting    Hospitalized for vomiting along with allergic reaction  . Penicillins Other (See Comments)    unknown  . Propoxyphene-Acetaminophen Other (See Comments)    confused  . Tetracycline Hcl Other (See Comments)    vomiting  . Vicodin (Hydrocodone-Acetaminophen) Other (See Comments)    confusion     Past Medical History: Past Medical History  Diagnosis Date  . Diabetes mellitus, type 2   . Hypertension   . Unspecified asthma   . Anxiety state, unspecified   . Hypercholesteremia   . Hypothyroidism   . Obesity   . GERD (gastroesophageal reflux disease)   . Iron deficiency anemia   . DDD (degenerative disc disease), lumbar   . Spinal stenosis   . Low back pain   . Degeneration of cervical intervertebral disc   . Left knee pain     Following fall 11/15/2009.  Marland Kitchen Acne rosacea   . Heme positive stool     EGD on 04/08/2010 by Dr. Ewing Schlein showed chronic gastritis and a few gastric polyps; pathology showed a fundic gland polyp.  Colonoscopy on 04/08/2010 showed small external and internal hemorrhoids, and a few benign-appearing diminutive polyps in the rectum, the distal sigmoid colon, and in the distal descending colon; pathology showed hyperplastic polyps.   . Hematochezia   . Irregular menses   . Vagina bleeding   . Atrophic  vaginitis   . Viral warts, unspecified     Right hand.  . Chronic interstitial cystitis     Followed by Dr. Marcine Matar  . Elevated transaminase level     Mild elevation, AST=48, ALT=51 on 02/01/07.  . Allergic rhinitis   . Carpal tunnel syndrome   . Atypical chest pain 12/11/2008  Normal stress nuclear study 01/12/2009 by Dr. Peter Swaziland  . Bilateral leg edema     Past Surgical History: Past Surgical History  Procedure Laterality Date  . Carpal tunnel release  05/08/2000    By Dr. Katy Fitch. Sypher, Montez Hageman.    Family History: Family History  Problem Relation Age of Onset  . Heart attack Father 47  . Breast cancer Neg Hx   . Colon cancer Neg Hx   . Diabetes Mother   . Hypertension Mother     Social History: History   Social History  . Marital Status: Single    Spouse Name: N/A    Number of Children: N/A  . Years of Education: N/A   Occupational History  . Not on file.   Social History Main Topics  . Smoking status: Former Smoker -- 1.00 packs/day for 15 years    Types: Cigarettes    Quit date: 01/18/1996  . Smokeless tobacco: Never Used  . Alcohol Use: No  . Drug Use: No  . Sexually Active: Not on file   Other Topics Concern  . Not on file   Social History Narrative  . No narrative on file     Vital Signs: Blood pressure 126/80, pulse 97, temperature 98.4 F (36.9 C), temperature source Oral, height 5\' 1"  (1.549 m), weight 231 lb 14.4 oz (105.189 kg), last menstrual period 01/17/2009, SpO2 98.00%.      Objective:   Physical Exam  Constitutional: She appears well-developed and well-nourished. No distress.  HENT:  Head: Normocephalic and atraumatic.  Eyes: Conjunctivae are normal. Pupils are equal, round, and reactive to light. No scleral icterus.  Neck: Normal range of motion. Neck supple. No tracheal deviation present.  Musculoskeletal: Normal range of motion. She exhibits edema (Trace bilateral pretibial pitting edema and very mild  circumferential erythema which is improved since her visit on 4/17. TTP in pretibial areas bilaterally. ).          Assessment & Plan:

## 2013-03-19 NOTE — Patient Instructions (Addendum)
General instructions:  Please use the prescription you were given today to obtain fitted compression stockings.   Continue to elevate your legs while at rest.   Take amitriptyline (50mg ) every night for your complaints of tingling in your legs.   Venous Stasis and Chronic Venous Insufficiency As people age, the veins located in their legs may weaken and stretch. When veins weaken and lose the ability to pump blood effectively, the condition is called chronic venous insufficiency (CVI) or venous stasis. Almost all veins return blood back to the heart. This happens by:  The force of the heart pumping fresh blood pushes blood back to the heart.  Blood flowing to the heart from the force of gravity. In the deep veins of the legs, blood has to fight gravity and flow upstream back to the heart. Here, the leg muscles contract to pump blood back toward the heart. Vein walls are elastic, and many veins have small valves that only allow blood to flow in one direction. When leg muscles contract, they push inward against the elastic vein walls. This squeezes blood upward, opens the valves, and moves blood toward the heart. When leg muscles relax, the vein wall also relaxes and the valves inside the vein close to prevent blood from flowing backward. This method of pumping blood out of the legs is called the venous pump. CAUSES  The venous pump works best while walking and leg muscles are contracting. But when a person sits or stands, blood pressure in leg veins can build. Deep veins are usually able to withstand short periods of inactivity, but long periods of inactivity (and increased pressure) can stretch, weaken, and damage vein walls. High blood pressure can also stretch and damage vein walls. The veins may no longer be able to pump blood back to the heart. Venous hypertension (high blood pressure inside veins) that lasts over time is a primary cause of CVI. CVI can also be caused by:   Deep vein  thrombosis, a condition where a thrombus (blood clot) blocks blood flow in a vein.  Phlebitis, an inflammation of a superficial vein that causes a blood clot to form. Other risk factors for CVI may include:   Heredity.  Obesity.  Pregnancy.  Sedentary lifestyle.  Smoking.  Jobs requiring long periods of standing or sitting in one place.  Age and gender:  Women in their 44's and 10's and men in their 3's are more prone to developing CVI. SYMPTOMS  Symptoms of CVI may include:   Varicose veins.  Ulceration or skin breakdown.  Lipodermatosclerosis, a condition that affects the skin just above the ankle, usually on the inside surface. Over time the skin becomes brown, smooth, tight and often painful. Those with this condition have a high risk of developing skin ulcers.  Reddened or discolored skin on the leg.  Swelling. DIAGNOSIS  Your caregiver can diagnose CVI after performing a careful medical history and physical examination. To confirm the diagnosis, the following tests may also be ordered:   Duplex ultrasound.  Plethysmography (tests blood flow).  Venograms (x-ray using a special dye). TREATMENT The goals of treatment for CVI are to restore a person to an active life and to minimize pain or disability. Typically, CVI does not pose a serious threat to life or limb, and with proper treatment most people with this condition can continue to lead active lives. In most cases, mild CVI can be treated on an outpatient basis with simple procedures. Treatment methods include:   Elastic  compression socks.  Sclerotherapy, a procedure involving an injection of a material that "dissolves" the damaged veins. Other veins in the network of blood vessels take over the function of the damaged veins.  Vein stripping (an older procedure less commonly used).  Laser Ablation surgery.  Valve repair. HOME CARE INSTRUCTIONS   Elastic compression socks must be worn every day. They can  help with symptoms and lower the chances of the problem getting worse, but they do not cure the problem.  Only take over-the-counter or prescription medicines for pain, discomfort, or fever as directed by your caregiver.  Your caregiver will review your other medications with you. SEEK MEDICAL CARE IF:   You are confused about how to take your medications.  There is redness, swelling, or increasing pain in the affected area.  There is a red streak or line that extends up or down from the affected area.  There is a breakdown or loss of skin in the affected area, even if the breakdown is small.  You develop an unexplained oral temperature above 102 F (38.9 C).  There is an injury to the affected area. SEEK IMMEDIATE MEDICAL CARE IF:   There is an injury and open wound to the affected area.  Pain is not adequately relieved with pain medication prescribed or becomes severe.  An oral temperature above 102 F (38.9 C) develops.  The foot/ankle below the affected area becomes suddenly numb or the area feels weak and hard to move. MAKE SURE YOU:   Understand these instructions.  Will watch your condition.  Will get help right away if you are not doing well or get worse. Document Released: 03/12/2007 Document Revised: 01/29/2012 Document Reviewed: 05/20/2007 [facility] Patient Information 2013 [place], [place].

## 2013-03-19 NOTE — Assessment & Plan Note (Addendum)
Signs/symptoms are stable/slightly improved over the last ~2 weeks. Pt was counseled that as chronic venous insufficiency is the most likely diagnosis, she will unlikely have any relief of her symptoms if she is not compliant with the measures recommended on her previous visit. She states that although she was given compression stockings in the ED, they were thigh-high and she could not tolerate them. She states she lost the written prescription for compression stockings given during her last visit, so another copy was given today. Will check urine microalbumin today to assess for proteinuria (negative when last assessed in 2011).  - compression stockings - LE elevation

## 2013-03-19 NOTE — Assessment & Plan Note (Signed)
Likely contributing to her complaints of tingling sensations in her lower legs and feet, particularly as she is noncompliant with her home amitriptyline. - amitriptyline 50mg  qhs

## 2013-03-19 NOTE — Progress Notes (Signed)
Case discussed with Dr. McTyre soon after the visit. We reviewed the resident's history and exam and pertinent patient test results. I agree with the assessment, diagnosis and plan of care documented in the resident's note. 

## 2013-03-20 LAB — MICROALBUMIN / CREATININE URINE RATIO
Creatinine, Urine: 21.5 mg/dL
Microalb Creat Ratio: 23.3 mg/g (ref 0.0–30.0)
Microalb, Ur: 0.5 mg/dL (ref 0.00–1.89)

## 2013-03-20 NOTE — Addendum Note (Signed)
Addended by: Elenor Legato R on: 03/20/2013 10:39 AM   Modules accepted: Orders

## 2013-03-21 ENCOUNTER — Ambulatory Visit: Payer: Medicare HMO | Admitting: Internal Medicine

## 2013-03-21 ENCOUNTER — Other Ambulatory Visit: Payer: Medicare Other

## 2013-03-21 ENCOUNTER — Telehealth: Payer: Self-pay | Admitting: *Deleted

## 2013-03-21 NOTE — Telephone Encounter (Signed)
Pt calls and states her back is worse and she needs to be seen, appt 1315 dr Loistine Chance, she is cautioned that if she cannot wait or becomes worse to please go to ED, she is cautioned that according to her pain contract she would need to abide by the specifics of the contract. She states she will try to wait for her appt

## 2013-03-21 NOTE — Telephone Encounter (Signed)
Ok thanks 

## 2013-03-22 ENCOUNTER — Other Ambulatory Visit: Payer: Self-pay | Admitting: Internal Medicine

## 2013-03-28 ENCOUNTER — Emergency Department (HOSPITAL_COMMUNITY)
Admission: EM | Admit: 2013-03-28 | Discharge: 2013-03-28 | Disposition: A | Discharge status: Home / Self Care | Payer: Medicare HMO | Attending: Emergency Medicine | Admitting: Emergency Medicine

## 2013-03-28 ENCOUNTER — Encounter (HOSPITAL_COMMUNITY): Payer: Self-pay | Admitting: Emergency Medicine

## 2013-03-28 DIAGNOSIS — Z8669 Personal history of other diseases of the nervous system and sense organs: Secondary | ICD-10-CM | POA: Insufficient documentation

## 2013-03-28 DIAGNOSIS — R21 Rash and other nonspecific skin eruption: Secondary | ICD-10-CM | POA: Insufficient documentation

## 2013-03-28 DIAGNOSIS — E7800 Pure hypercholesterolemia, unspecified: Secondary | ICD-10-CM | POA: Insufficient documentation

## 2013-03-28 DIAGNOSIS — Z8742 Personal history of other diseases of the female genital tract: Secondary | ICD-10-CM | POA: Insufficient documentation

## 2013-03-28 DIAGNOSIS — Z87448 Personal history of other diseases of urinary system: Secondary | ICD-10-CM | POA: Insufficient documentation

## 2013-03-28 DIAGNOSIS — Z8619 Personal history of other infectious and parasitic diseases: Secondary | ICD-10-CM | POA: Insufficient documentation

## 2013-03-28 DIAGNOSIS — Z79899 Other long term (current) drug therapy: Secondary | ICD-10-CM | POA: Insufficient documentation

## 2013-03-28 DIAGNOSIS — I8312 Varicose veins of left lower extremity with inflammation: Secondary | ICD-10-CM

## 2013-03-28 DIAGNOSIS — Z8719 Personal history of other diseases of the digestive system: Secondary | ICD-10-CM | POA: Insufficient documentation

## 2013-03-28 DIAGNOSIS — M545 Low back pain, unspecified: Secondary | ICD-10-CM | POA: Insufficient documentation

## 2013-03-28 DIAGNOSIS — E039 Hypothyroidism, unspecified: Secondary | ICD-10-CM | POA: Insufficient documentation

## 2013-03-28 DIAGNOSIS — Z862 Personal history of diseases of the blood and blood-forming organs and certain disorders involving the immune mechanism: Secondary | ICD-10-CM | POA: Insufficient documentation

## 2013-03-28 DIAGNOSIS — Z8739 Personal history of other diseases of the musculoskeletal system and connective tissue: Secondary | ICD-10-CM | POA: Insufficient documentation

## 2013-03-28 DIAGNOSIS — F411 Generalized anxiety disorder: Secondary | ICD-10-CM | POA: Insufficient documentation

## 2013-03-28 DIAGNOSIS — Z7982 Long term (current) use of aspirin: Secondary | ICD-10-CM | POA: Insufficient documentation

## 2013-03-28 DIAGNOSIS — E669 Obesity, unspecified: Secondary | ICD-10-CM | POA: Insufficient documentation

## 2013-03-28 DIAGNOSIS — K219 Gastro-esophageal reflux disease without esophagitis: Secondary | ICD-10-CM | POA: Insufficient documentation

## 2013-03-28 DIAGNOSIS — E119 Type 2 diabetes mellitus without complications: Secondary | ICD-10-CM | POA: Insufficient documentation

## 2013-03-28 DIAGNOSIS — Z8709 Personal history of other diseases of the respiratory system: Secondary | ICD-10-CM | POA: Insufficient documentation

## 2013-03-28 DIAGNOSIS — I8311 Varicose veins of right lower extremity with inflammation: Secondary | ICD-10-CM

## 2013-03-28 DIAGNOSIS — Z872 Personal history of diseases of the skin and subcutaneous tissue: Secondary | ICD-10-CM | POA: Insufficient documentation

## 2013-03-28 DIAGNOSIS — IMO0001 Reserved for inherently not codable concepts without codable children: Secondary | ICD-10-CM | POA: Insufficient documentation

## 2013-03-28 DIAGNOSIS — Z87891 Personal history of nicotine dependence: Secondary | ICD-10-CM | POA: Insufficient documentation

## 2013-03-28 DIAGNOSIS — I8310 Varicose veins of unspecified lower extremity with inflammation: Secondary | ICD-10-CM | POA: Insufficient documentation

## 2013-03-28 DIAGNOSIS — I10 Essential (primary) hypertension: Secondary | ICD-10-CM | POA: Insufficient documentation

## 2013-03-28 DIAGNOSIS — J45909 Unspecified asthma, uncomplicated: Secondary | ICD-10-CM | POA: Insufficient documentation

## 2013-03-28 LAB — CBC WITH DIFFERENTIAL/PLATELET
Basophils Absolute: 0 10*3/uL (ref 0.0–0.1)
HCT: 33.7 % — ABNORMAL LOW (ref 36.0–46.0)
Lymphocytes Relative: 13 % (ref 12–46)
Lymphs Abs: 1.2 10*3/uL (ref 0.7–4.0)
MCV: 86.9 fL (ref 78.0–100.0)
Monocytes Absolute: 0.6 10*3/uL (ref 0.1–1.0)
Neutro Abs: 7.6 10*3/uL (ref 1.7–7.7)
RBC: 3.88 MIL/uL (ref 3.87–5.11)
RDW: 13.6 % (ref 11.5–15.5)
WBC: 9.7 10*3/uL (ref 4.0–10.5)

## 2013-03-28 LAB — BASIC METABOLIC PANEL
CO2: 31 mEq/L (ref 19–32)
Chloride: 96 mEq/L (ref 96–112)
Creatinine, Ser: 0.73 mg/dL (ref 0.50–1.10)
GFR calc Af Amer: >90 mL/min (ref >90)
Potassium: 3.7 mEq/L (ref 3.5–5.1)
Sodium: 139 mEq/L (ref 135–145)

## 2013-03-28 MED ORDER — HYDROCODONE-ACETAMINOPHEN 5-325 MG PO TABS
1.0000 | ORAL_TABLET | Freq: Once | ORAL | Status: AC
  Administered 2013-03-28: 1 via ORAL
  Filled 2013-03-28: qty 1

## 2013-03-28 MED ORDER — HYDROMORPHONE HCL PF 1 MG/ML IJ SOLN
1.0000 mg | Freq: Once | INTRAMUSCULAR | Status: AC
  Administered 2013-03-28: 1 mg via INTRAVENOUS
  Filled 2013-03-28: qty 1

## 2013-03-28 NOTE — ED Notes (Signed)
Pt c/o bilateral leg pain with redness and swelling; pt sts increased pain in upper back and neck since fall on 4/17; pt tearful

## 2013-03-28 NOTE — ED Notes (Signed)
Rhunette Croft, MD at bedside speaking with pt

## 2013-03-28 NOTE — ED Notes (Signed)
Went to d/c pt's IV and pt is refusing to allow me to for she wants to speak with the MD about receiving more pain medicine; pt stated "I want to know what pain medicine is going to be given to me before I go home because I'm in all this pain with my back.  Is he just going to discharge me and I'm still in pain? Can you tell the doctor I need some dilaudid or something like that before I go.  Or is he going to give me a prescription for some pain medicine? It's not your fault sweetheart I know you're just following orders but I need some more pain medicine"; went and informed MD and RN as well

## 2013-03-28 NOTE — ED Notes (Signed)
Pt ambulated to bathroom without any problems 

## 2013-03-28 NOTE — ED Notes (Signed)
D/c'd pt's IV now; pt getting dressed to be discharged home

## 2013-03-28 NOTE — ED Provider Notes (Addendum)
History     CSN: [id number]  Arrival date & time 03/28/13  1404   First MD Initiated Contact with Patient 03/28/13 1456      Chief Complaint  Patient presents with  . Generalized Body Aches  . Back Pain  . Leg Pain    (Consider location/radiation/quality/duration/timing/severity/associated sxs/prior treatment) HPI Comments: Pt comes in with cc of leg pain. Past medical history significant for DM, diabetic neuropathy, anxiety, degenerative disc disease, spinal stenosis, and chronic bilateral leg edema - thought to be due to venous stasis who presents with worsened leg swelling and redness bilaterally. Pt states that there is increased redness, pain and swelling over her leg - as her legs have gradually gotten worse. There is no n/v/f/c. Pt has been taking her pain meds as prescribed (she is on dilaudid and other analgesics). Pt called pcp office, and was requested to come to the ER. She states that she has been having increased difficulty in walking - and needs assistance with daily activities. No new falls.    Patient is a 52 y.o. female presenting with back pain and leg pain. The history is provided by the patient.  Back Pain Associated symptoms: leg pain   Associated symptoms: no abdominal pain and no chest pain   Leg Pain Associated symptoms: back pain   Associated symptoms: no neck pain     Past Medical History  Diagnosis Date  . Diabetes mellitus, type 2   . Hypertension   . Unspecified asthma   . Anxiety state, unspecified   . Hypercholesteremia   . Hypothyroidism   . Obesity   . GERD (gastroesophageal reflux disease)   . Iron deficiency anemia   . DDD (degenerative disc disease), lumbar   . Spinal stenosis   . Low back pain   . Degeneration of cervical intervertebral disc   . Left knee pain     Following fall 11/15/2009.  Marland Kitchen Acne rosacea   . Heme positive stool     EGD on 04/08/2010 by Dr. Ewing Schlein showed chronic gastritis and a few gastric polyps; pathology showed a  fundic gland polyp.  Colonoscopy on 04/08/2010 showed small external and internal hemorrhoids, and a few benign-appearing diminutive polyps in the rectum, the distal sigmoid colon, and in the distal descending colon; pathology showed hyperplastic polyps.   . Hematochezia   . Irregular menses   . Vagina bleeding   . Atrophic vaginitis   . Viral warts, unspecified     Right hand.  . Chronic interstitial cystitis     Followed by Dr. Marcine Matar  . Elevated transaminase level     Mild elevation, AST=48, ALT=51 on 02/01/07.  . Allergic rhinitis   . Carpal tunnel syndrome   . Atypical chest pain 12/11/2008    Normal stress nuclear study 01/12/2009 by Dr. Peter [place]  . Bilateral leg edema     Past Surgical History  Procedure Laterality Date  . Carpal tunnel release  05/08/2000    By Dr. Katy Fitch. Sypher, Montez Hageman.    Family History  Problem Relation Age of Onset  . Heart attack Father 35  . Breast cancer Neg Hx   . Colon cancer Neg Hx   . Diabetes Mother   . Hypertension Mother     History  Substance Use Topics  . Smoking status: Former Smoker -- 1.00 packs/day for 15 years    Types: Cigarettes    Quit date: 01/18/1996  . Smokeless tobacco: Never Used  . Alcohol Use: No  OB History   Grav Para Term Preterm Abortions TAB SAB Ect Mult Living                  Review of Systems  Constitutional: Negative for activity change.  HENT: Negative for facial swelling and neck pain.   Respiratory: Negative for cough, shortness of breath and wheezing.   Cardiovascular: Negative for chest pain.  Gastrointestinal: Negative for nausea, vomiting, abdominal pain, diarrhea, constipation, blood in stool and abdominal distention.  Genitourinary: Negative for hematuria and difficulty urinating.  Musculoskeletal: Positive for myalgias, back pain and arthralgias.  Skin: Positive for rash. Negative for color change.  Neurological: Negative for speech difficulty.  Hematological: Does not  bruise/bleed easily.  Psychiatric/Behavioral: Negative for confusion.    Allergies  Codeine sulfate; Meperidine and related; Minocycline hcl; Penicillins; Propoxyphene-acetaminophen; Tetracycline hcl; and Vicodin  Home Medications   Current Outpatient Rx  Name  Route  Sig  Dispense  Refill  . albuterol (PROAIR HFA) 108 (90 BASE) MCG/ACT inhaler   Inhalation   Inhale 1-2 puffs into the lungs every 6 (six) hours as needed for shortness of breath.   18 g   11   . ALPRAZolam (XANAX) 1 MG tablet   Oral   Take 1 mg by mouth 4 (four) times daily as needed. For anxiety         . amitriptyline (ELAVIL) 25 MG tablet   Oral   Take 2 tablets (50 mg total) by mouth at bedtime.   60 tablet   0   . aspirin 81 MG chewable tablet   Oral   Chew 81 mg by mouth daily.          . carisoprodol (SOMA) 350 MG tablet      TAKE 1 TABLET BY MOUTH 4 TIMES A DAY AS NEEDED FOR MUSCLE SPASMS   120 tablet   2   . enalapril (VASOTEC) 5 MG tablet      TAKE 1 TABLET EVERY DAY   30 tablet   6   . erythromycin ethylsuccinate (EES) 400 MG tablet   Oral   Take 400 mg by mouth 2 (two) times daily. Maintenance for Rosacea         . esomeprazole (NEXIUM) 40 MG capsule   Oral   Take 1 capsule (40 mg total) by mouth daily before breakfast.   31 capsule   11   . fluticasone (FLONASE) 50 MCG/ACT nasal spray   Nasal   Place 2 sprays into the nose daily as needed. For allergies         . fluticasone (FLOVENT DISKUS) 50 MCG/BLIST diskus inhaler   Inhalation   Inhale 1 puff into the lungs 2 (two) times daily.         . furosemide (LASIX) 20 MG tablet   Oral   Take 3 tablets (60 mg total) by mouth daily.   90 tablet   1   . HYDROmorphone (DILAUDID) 2 MG tablet   Oral   Take 1 tablet (2 mg total) by mouth 2 (two) times daily as needed for pain.   60 tablet   0   . hydrOXYzine (ATARAX/VISTARIL) 10 MG tablet   Oral   Take 1 tablet (10 mg total) by mouth 3 (three) times daily as needed  for itching.   30 tablet   0   . ipratropium (ATROVENT HFA) 17 MCG/ACT inhaler   Inhalation   Inhale 2 puffs into the lungs 4 (four) times daily as  needed. For wheezing         . KRISTALOSE 20 G packet   Oral   Take 20 g by mouth 2 (two) times daily as needed (constipation).          . metFORMIN (GLUCOPHAGE-XR) 500 MG 24 hr tablet      TAKE 3 TABLETS BY MOUTH DAILY   90 tablet   6   . methocarbamol (ROBAXIN) 500 MG tablet   Oral   Take 1 tablet (500 mg total) by mouth 2 (two) times daily.   20 tablet   0   . potassium chloride (KLOR-CON M10) 10 MEQ tablet   Oral   Take 2 tablets (20 mEq total) by mouth 2 (two) times daily.   120 tablet   2   . rOPINIRole (REQUIP) 3 MG tablet   Oral   Take 3 mg by mouth at bedtime.         . rosuvastatin (CRESTOR) 10 MG tablet   Oral   Take 10 mg by mouth daily.         . sertraline (ZOLOFT) 100 MG tablet   Oral   Take 100 mg by mouth daily.         . solifenacin (VESICARE) 10 MG tablet   Oral   Take 10 mg by mouth daily.           Marland Kitchen SYNTHROID 75 MCG tablet      TAKE 1 TABLET BY MOUTH DAILY   30 tablet   11     Dispense as written.   . zolpidem (AMBIEN) 10 MG tablet   Oral   Take 10 mg by mouth at bedtime as needed. For sleep         . glucose blood (ACCU-CHEK COMPACT TEST DRUM) test strip      Use to Check Blood Glucose Daily.  ICD Code: 250.00, not insulin dependent   100 each   3   . Lancet Devices (ACCU-CHEK SOFTCLIX) lancets      Use to Check Blood Glucose Daily.  ICD Code: 250.00   100 each   3     BP 120/64  Pulse 105  Temp(Src) 99.3 F (37.4 C) (Oral)  Resp 18  SpO2 97%  LMP 01/17/2009  Physical Exam  Nursing note and vitals reviewed. Constitutional: She is oriented to person, place, and time. She appears well-developed and well-nourished.  HENT:  Head: Normocephalic and atraumatic.  Eyes: EOM are normal. Pupils are equal, round, and reactive to light.  Neck: Neck supple.   Cardiovascular: Normal rate, regular rhythm and normal heart sounds.   No murmur heard. Pulmonary/Chest: Effort normal. No respiratory distress.  Abdominal: Soft. She exhibits no distension. There is no tenderness. There is no rebound and no guarding.  Musculoskeletal:  Bilateral leg swelling, with firm/taut skin. There is some blanching/pitting and skin is warm to touch. Erythema extending to just above the knee. There is no crepitus. 1+ DP bilaterally.   Neurological: She is alert and oriented to person, place, and time.  Skin: Skin is warm and dry.    ED Course  Procedures (including critical care time)  Labs Reviewed  BASIC METABOLIC PANEL  CBC WITH DIFFERENTIAL   No results found.   No diagnosis found.    MDM  Diabetic patient with hx of venous stasis and resultant dermatitis comes in with cc of leg swelling, increased pain and redness. It appears that the redness is gradually expanding. There is some warmth  to touch - but per notes in the past - this was presumed to be stasis and not cellulitis and no tx was initiated. DVT workup is negative, negative echo so far.  My impression is cellulitis vs. worsening of chronic stasis and dermatitis. I believe it will be best served for the primary team to evaluate the patient, as they have examined her before.  Pt also states that it is hard for her to ambulate -but she is not bed bound. Doesn't want to do SNF.   Derwood Kaplan, MD 03/28/13 1609  6:13 PM IM team saw the patient. Patient was able to ambulate -and so they will be happy to schedule a close f/u for her.  Pt requested pain meds, i informed her that she will have to ask her PCP for dilaudid and Soma scripts, and that she shouldn't have run out of them already. I dont feel comfortable adding any narcotics to her current regimen - and have requested to continue seeing her promary for optimal pain control.  Derwood Kaplan, MD 03/28/13 1816

## 2013-03-31 ENCOUNTER — Telehealth: Payer: Self-pay | Admitting: *Deleted

## 2013-03-31 NOTE — Telephone Encounter (Signed)
Pt calls and states she recently went to ED for back and leg edema, pain. She states the ED MD told her "you cannot keep coming to the emergency room for this, what you've got can't be helped" she states that she has this problem since she was sent to radiology last year and fell from a wh/ch during the procedure. appt is given for tues 5/13 dr patel

## 2013-03-31 NOTE — Telephone Encounter (Signed)
Agree with plan 

## 2013-04-01 ENCOUNTER — Ambulatory Visit: Payer: Medicare HMO | Admitting: Internal Medicine

## 2013-04-01 ENCOUNTER — Ambulatory Visit (INDEPENDENT_AMBULATORY_CARE_PROVIDER_SITE_OTHER): Payer: Medicare HMO | Admitting: Internal Medicine

## 2013-04-01 ENCOUNTER — Encounter: Payer: Self-pay | Admitting: Internal Medicine

## 2013-04-01 VITALS — BP 117/68 | HR 90 | Temp 98.8°F | Ht 61.0 in | Wt 233.1 lb

## 2013-04-01 DIAGNOSIS — I872 Venous insufficiency (chronic) (peripheral): Secondary | ICD-10-CM

## 2013-04-01 DIAGNOSIS — I10 Essential (primary) hypertension: Secondary | ICD-10-CM

## 2013-04-01 DIAGNOSIS — M5430 Sciatica, unspecified side: Secondary | ICD-10-CM

## 2013-04-01 DIAGNOSIS — I8310 Varicose veins of unspecified lower extremity with inflammation: Secondary | ICD-10-CM

## 2013-04-01 LAB — GLUCOSE, CAPILLARY: Glucose-Capillary: 215 mg/dL — ABNORMAL HIGH (ref 70–99)

## 2013-04-01 MED ORDER — HYDROXYZINE HCL 10 MG PO TABS: 10.0000 mg | ORAL_TABLET | Freq: Three times a day (TID) | ORAL | Status: DC | PRN

## 2013-04-01 NOTE — Addendum Note (Signed)
Addended by: Lyn Hollingshead C on: 04/01/2013 04:41 PM   Modules accepted: Orders

## 2013-04-01 NOTE — Progress Notes (Signed)
  Subjective:    Patient ID: Mary Gray, female    DOB: December 18, 1960, 52 y.o.   MRN: [id number]  HPI patient is a 52 year woman with DM 2, hypothyroidism, hypertension, chronic leg edema, chronic back pain and other problems as per problem list who comes to the clinic for worsening low back pain and bilateral leg edema and pain. She was seen in ED in early part of this month which was diagnosed with venous stasis dermatitis and sent home with clinic followup. She was advised to wear compression stockings. She did not whether this is she says she does not have one up to the knees.  -She describes a worsening of her low back pain since she fell while she was in the ultrasound lab for lower extremity duplex on 03/06/2013. Also since then she has bilateral lower extremity redness which is progressing slowly and is up to her knees now. She describes hypersensitivity pain in her bilateral leg up to the knees. - Also describes numbness in her bilateral feet but not in leg. - Does not have any focal weakness. - Does not have any loss of bowel or bladder function. - Describes warmth of bilateral leg, but no systemic fever, Chills, nausea, vomiting, abdominal pain, chest pain, short of breath, diarrhea, headache, palpitations.   Review of Systems     as per history of present illness.  Objective:   Physical Exam  General: Intermittently moaning with pain. HEENT: PERRL, EOMI, no scleral icterus Cardiac: S1, S2, RRR, no rubs, murmurs or gallops Pulm: clear to auscultation bilaterally, moving normal volumes of air Abd: soft, nontender, nondistended, BS present Ext: DP 2+ bilaterally. Bilateral leg and foot tight edema with chronic skin thickening, redness of the knees bilaterally with minimal warmth. Hypersensitive to touch intermittently.  Neuro: alert and oriented X3, cranial nerves II-XII grossly intact. Patient ambulating normally without pain.        Assessment & Plan:

## 2013-04-01 NOTE — Patient Instructions (Addendum)
Please make followup appointment in 5-7 days.  - Start taking Lasix 60 mg in morning and 40 mg in evening for next 5 days. - Measure your weight every day and if there is a significant drop of more than 5 pounds in 2-3 days, give [place] a call. - Start wearing compression stockings and elevate legs as much as possible.  - Call neurosurgeon and make an appointment if your back pain continues to worsen.

## 2013-04-01 NOTE — Assessment & Plan Note (Addendum)
Recent evaluation in ED. Patient with worsening redness bilaterally. Increase hypersensitivity with pain.  - Some warmth but no typical signs of cellulitis. - DVT ruled out in April by duplex. - Patient likely has venous stasis edema and now has dermatitis from that. - Advised to increase the dose of Lasix from 60 mg in the morning to 60 in morning and 40 in the evening for next 5 days. - Use compression stockings- prescription given. Elevate the legs. Hydroxyzine 10 mg 3 times a day as needed for itching. - Reevaluating clinic in 5-7 days.

## 2013-04-01 NOTE — Assessment & Plan Note (Signed)
BP Readings from Last 3 Encounters:  04/01/13 117/68  03/28/13 120/64  03/19/13 126/80    Lab Results  Component Value Date   NA 139 03/28/2013   K 3.7 03/28/2013   CREATININE 0.73 03/28/2013    Assessment: Blood pressure control:   at goal Progress toward BP goal:    stable Comments:   Plan: Medications:  continue current medications, Continue Vasotec, Lasix- increased her dose from 60 daily to 60 in morning and 40 in evening for next 5 days Educational resources provided:   Self management tools provided:   Other plans: Followup in 5-7 days.

## 2013-04-01 NOTE — Assessment & Plan Note (Signed)
Worsening pain per patient. Patient ambulating okay without any weakness. - Patient describes numbness in her bilateral feet. - Advised to followup with neurosurgery for possible surgical intervention if pain does not get better.  - Patient verbalized understanding.

## 2013-04-02 ENCOUNTER — Other Ambulatory Visit: Payer: Self-pay | Admitting: *Deleted

## 2013-04-02 NOTE — Telephone Encounter (Signed)
Last refill 4/17 She would like you to change back to # 120 a month

## 2013-04-02 NOTE — Progress Notes (Signed)
Case discussed with Dr. Patel immediately after the resident saw the patient.  We reviewed the resident's history and exam and pertinent patient test results.  I agree with the assessment, diagnosis and plan of care documented in the resident's note. 

## 2013-04-03 MED ORDER — HYDROMORPHONE HCL 2 MG PO TABS: 2.0000 mg | ORAL_TABLET | Freq: Three times a day (TID) | ORAL | Status: DC | PRN

## 2013-04-03 NOTE — Telephone Encounter (Signed)
Given her recently increased pain following a fall, I will increase the frequency of the  hydromorphone to 2 mg PO Q8H as needed for pain, and increase the number to #90 per month.  Please instruct her to only use if needed and not to exceed the prescribed dose.  Patient should be sure to keep her scheduled appointment with me in June.    Refill printed and signed - nurse to complete.

## 2013-04-04 ENCOUNTER — Telehealth: Payer: Self-pay | Admitting: *Deleted

## 2013-04-04 NOTE — Telephone Encounter (Signed)
Pt was called and asked to [facility] with [facility] because Mammogram was abnormal. Pt is aware of this and will call tomorrow for appointment at [facility].

## 2013-04-10 ENCOUNTER — Ambulatory Visit (INDEPENDENT_AMBULATORY_CARE_PROVIDER_SITE_OTHER): Payer: Medicare HMO | Admitting: Internal Medicine

## 2013-04-10 ENCOUNTER — Encounter: Payer: Self-pay | Admitting: Internal Medicine

## 2013-04-10 VITALS — BP 114/75 | HR 96 | Temp 98.4°F | Ht 61.0 in | Wt 228.0 lb

## 2013-04-10 DIAGNOSIS — I8310 Varicose veins of unspecified lower extremity with inflammation: Secondary | ICD-10-CM

## 2013-04-10 DIAGNOSIS — M5430 Sciatica, unspecified side: Secondary | ICD-10-CM

## 2013-04-10 DIAGNOSIS — I872 Venous insufficiency (chronic) (peripheral): Secondary | ICD-10-CM

## 2013-04-10 NOTE — Progress Notes (Signed)
  Subjective:    Patient ID: Mary Gray, female    DOB: December 09, 1960, 52 y.o.   MRN: [id number]  HPI patient is a 52 year woman with hypothyroidism, DM 2, hypertension, bilateral leg swelling and other problems as per problem list who comes to the clinic for followup visit for leg pain and swelling  I saw her last week for worsening bilateral lower extremities pain, redness and swelling. She has venous stasis dermatitis. Will increase the dose of Lasix to 60 mg in morning and 40 mg evening. She is 5 pounds negative since last visit. There is improvement in her swelling. Legs are less tense, redness has improved bilateral leg. Pain is also a little bit better. Although she still does have hypersensitivity to touch intermittently.  She denies any fever, chills, nausea vomiting, abdominal pain, chest pain, short of breath, diarrhea.   Review of Systems    as per history of present illness. Objective:   Physical Exam  General: resting in bed HEENT: PERRL, EOMI, no scleral icterus Cardiac: RRR, no rubs, murmurs or gallops Pulm: clear to auscultation bilaterally, moving normal volumes of air Abd: soft, nontender, nondistended, BS present Ext: warm and well perfused, no pedal edema Neuro: alert and oriented X3, cranial nerves II-XII grossly intact       Assessment & Plan:

## 2013-04-10 NOTE — Assessment & Plan Note (Signed)
Reiterated the importance of follow with neurosurgery for possible surgical intervention rather than adding new pain medications.

## 2013-04-10 NOTE — Assessment & Plan Note (Addendum)
Symptomatically improving with increased Lasix dose. Decreased swelling and redness.  - Continue Lasix 60 mg in morning and 40 mg in evening until next visit with Dr. Meredith Pel on 05/14/2013. - Patient asked for in between Vicodin for pain- discussed with her that she should talk about this with Dr. Meredith Pel as she has pain contract with him. She verbalized understanding. - She hasn't got the compression stocking yet as insurance does not pay for that. - Will try to arrange for that today.

## 2013-04-10 NOTE — Patient Instructions (Signed)
Please continue with followup appointment with Dr. Meredith Pel on 05/14/2013.  Meanwhile continue taking Lasix 60 mg morning and 40 mg in evening.  Keep checking your weight regularly- and call us if your weight drops more than 3-5 pounds in 2 days.  Continue taking all medications regularly as you do.

## 2013-04-11 NOTE — Progress Notes (Signed)
Case discussed with Dr. Patel soon after the resident saw the patient.  We reviewed the resident's history and exam and pertinent patient test results.  I agree with the assessment, diagnosis and plan of care documented in the resident's note. 

## 2013-04-14 NOTE — ED Provider Notes (Addendum)
Medical screening examination/treatment/procedure(s) were performed by non-physician practitioner and as supervising physician Dr. Lorenso Courier (no longer available to sign chart) was immediately available for consultation/collaboration.   Hurman Horn, MD 04/14/13 1721  Medical Director  Hurman Horn, MD 04/27/13 [phone]

## 2013-04-21 ENCOUNTER — Telehealth: Payer: Self-pay | Admitting: *Deleted

## 2013-04-21 NOTE — Telephone Encounter (Signed)
Agree with plan 

## 2013-04-21 NOTE — Telephone Encounter (Signed)
Pt calls and states back pain is worse, also for 2-3 days ears have been hurting and for 1 day she has a sore throat. Cant take the pain, cannot come to clinic today. Will come tues 6/3 at 1315. She adds if she can't come she will call. She is advised if she cannot wait she may go to urg care or ED

## 2013-04-22 ENCOUNTER — Encounter: Payer: Medicare HMO | Admitting: Internal Medicine

## 2013-04-22 ENCOUNTER — Ambulatory Visit (INDEPENDENT_AMBULATORY_CARE_PROVIDER_SITE_OTHER): Payer: Medicare HMO | Admitting: Internal Medicine

## 2013-04-22 ENCOUNTER — Encounter: Payer: Self-pay | Admitting: Internal Medicine

## 2013-04-22 ENCOUNTER — Encounter: Payer: Medicare HMO | Admitting: Radiation Oncology

## 2013-04-22 ENCOUNTER — Ambulatory Visit
Admission: RE | Admit: 2013-04-22 | Discharge: 2013-04-22 | Disposition: A | Discharge status: Home / Self Care | Payer: Medicare HMO | Source: Ambulatory Visit | Attending: Internal Medicine | Admitting: Internal Medicine

## 2013-04-22 VITALS — BP 104/64 | HR 88 | Temp 97.1°F | Ht 58.5 in | Wt 225.3 lb

## 2013-04-22 DIAGNOSIS — R928 Other abnormal and inconclusive findings on diagnostic imaging of breast: Secondary | ICD-10-CM

## 2013-04-22 DIAGNOSIS — J069 Acute upper respiratory infection, unspecified: Secondary | ICD-10-CM | POA: Insufficient documentation

## 2013-04-22 MED ORDER — GUAIFENESIN ER 600 MG PO TB12: 1200.0000 mg | ORAL_TABLET | Freq: Two times a day (BID) | ORAL | Status: DC

## 2013-04-22 MED ORDER — HYDROCORTISONE-ACETIC ACID 1-2 % OT SOLN: 4.0000 [drp] | Freq: Two times a day (BID) | OTIC | Status: DC

## 2013-04-22 NOTE — Telephone Encounter (Signed)
Dear Myriam Jacobson,  I am on vacation and thus will not be in clinic.  My schedule should be blocked off so no one should have been added to my schedule.  Please have her scheduled to see someone else as soon as possible and please also inform Dr. Meredith Pel as she is his patient and has been scheduled incorrectly and will need to be seen by someone else.   Thanks!  Dr q

## 2013-04-22 NOTE — Telephone Encounter (Signed)
i attempted to call pt back and reschedule, left vmail, no rtc before 5pm

## 2013-04-22 NOTE — Patient Instructions (Addendum)
1. Use the ear drops I sent into your pharmacy. I also prescribed a medicine to help with your cough called mucinex. You can take 2 pills twice a day if needed. 2. Come back next week if you have worsening ear pain, hearing loss, fevers.

## 2013-04-22 NOTE — Assessment & Plan Note (Signed)
5 days symptoms of ear congestion/pain, nasal congestion, cough. No fever. No evidence of acute otitis media on exam, patient has small serous effusions of bilateral ears consistent with a viral upper respiratory infection. Do not believe that this is a bacterial upper respiratory infection.  - symptomatic treatment with guaifenesin, acetic acid/hydrocortisone ear drops. -Return to clinic if symptoms do not improve in next week, or if worsening ear pain/drainage, hearing loss, or fever

## 2013-04-22 NOTE — Progress Notes (Signed)
Patient ID: Parrish Daddario, female   DOB: 08/08/1961, 52 y.o.   MRN: [id number]  Subjective:   Patient ID: Elinora Weigand female   DOB: 1961-09-26 52 y.o.   MRN: [id number]  HPI: Ms.Sara Childrey is a 52 y.o. female with history of chronic low back pain and DM presenting for acute visit for nasal congestion, ear pain, cough. Mairead Schwarzkopf reports 5 days of nasal congestion, sore throat, cough, and ear congestion/pain. In the past couple days she's had a small amount of yellow mucus with coughing. She denies any chest pain or shortness of breath. She's had no fever or chills. No eye pain or vision changes. Occasional pain over her maxillary sinuses.  She has had a workup in the past for current sinusitis including CT scan which did not demonstrate any changes consistent with chronic sinusitis.   Past Medical History  Diagnosis Date  . Diabetes mellitus, type 2   . Hypertension   . Unspecified asthma(493.90)   . Anxiety state, unspecified   . Hypercholesteremia   . Hypothyroidism   . Obesity   . GERD (gastroesophageal reflux disease)   . Iron deficiency anemia   . DDD (degenerative disc disease), lumbar   . Spinal stenosis   . Low back pain   . Degeneration of cervical intervertebral disc   . Left knee pain     Following fall 11/15/2009.  Marland Kitchen Acne rosacea   . Heme positive stool     EGD on 04/08/2010 by Dr. Ewing Schlein showed chronic gastritis and a few gastric polyps; pathology showed a fundic gland polyp.  Colonoscopy on 04/08/2010 showed small external and internal hemorrhoids, and a few benign-appearing diminutive polyps in the rectum, the distal sigmoid colon, and in the distal descending colon; pathology showed hyperplastic polyps.   . Hematochezia   . Irregular menses   . Vagina bleeding   . Atrophic vaginitis   . Viral warts, unspecified     Right hand.  . Chronic interstitial cystitis     Followed by Dr. Marcine Matar  . Elevated transaminase level     Mild elevation, AST=48, ALT=51 on  02/01/07.  . Allergic rhinitis   . Carpal tunnel syndrome   . Atypical chest pain 12/11/2008    Normal stress nuclear study 01/12/2009 by Dr. Peter [place]  . Bilateral leg edema    Current Outpatient Prescriptions  Medication Sig Dispense Refill  . albuterol (PROAIR HFA) 108 (90 BASE) MCG/ACT inhaler Inhale 1-2 puffs into the lungs every 6 (six) hours as needed for shortness of breath.  18 g  11  . ALPRAZolam (XANAX) 1 MG tablet Take 1 mg by mouth 4 (four) times daily as needed. For anxiety      . amitriptyline (ELAVIL) 25 MG tablet Take 2 tablets (50 mg total) by mouth at bedtime.  60 tablet  0  . aspirin 81 MG chewable tablet Chew 81 mg by mouth daily.       . carisoprodol (SOMA) 350 MG tablet TAKE 1 TABLET BY MOUTH 4 TIMES A DAY AS NEEDED FOR MUSCLE SPASMS  120 tablet  2  . enalapril (VASOTEC) 5 MG tablet TAKE 1 TABLET EVERY DAY  30 tablet  6  . erythromycin ethylsuccinate (EES) 400 MG tablet Take 400 mg by mouth 2 (two) times daily. Maintenance for Rosacea      . esomeprazole (NEXIUM) 40 MG capsule Take 1 capsule (40 mg total) by mouth daily before breakfast.  31 capsule  11  . fluticasone (FLONASE) 50  MCG/ACT nasal spray Place 2 sprays into the nose daily as needed. For allergies      . fluticasone (FLOVENT DISKUS) 50 MCG/BLIST diskus inhaler Inhale 1 puff into the lungs 2 (two) times daily.      . furosemide (LASIX) 20 MG tablet Take 3 tablets (60 mg total) by mouth daily.  90 tablet  1  . glucose blood (ACCU-CHEK COMPACT TEST DRUM) test strip Use to Check Blood Glucose Daily.  ICD Code: 250.00, not insulin dependent  100 each  3  . HYDROmorphone (DILAUDID) 2 MG tablet Take 1 tablet (2 mg total) by mouth every 8 (eight) hours as needed for pain.  90 tablet  0  . hydrOXYzine (ATARAX/VISTARIL) 10 MG tablet Take 1 tablet (10 mg total) by mouth 3 (three) times daily as needed for itching.  90 tablet  1  . ipratropium (ATROVENT HFA) 17 MCG/ACT inhaler Inhale 2 puffs into the lungs 4 (four) times  daily as needed. For wheezing      . KRISTALOSE 20 G packet Take 20 g by mouth 2 (two) times daily as needed (constipation).       Demetra Shiner Devices (ACCU-CHEK SOFTCLIX) lancets Use to Check Blood Glucose Daily.  ICD Code: 250.00  100 each  3  . metFORMIN (GLUCOPHAGE-XR) 500 MG 24 hr tablet TAKE 3 TABLETS BY MOUTH DAILY  90 tablet  6  . methocarbamol (ROBAXIN) 500 MG tablet Take 1 tablet (500 mg total) by mouth 2 (two) times daily.  20 tablet  0  . potassium chloride (KLOR-CON M10) 10 MEQ tablet Take 2 tablets (20 mEq total) by mouth 2 (two) times daily.  120 tablet  2  . rOPINIRole (REQUIP) 3 MG tablet Take 3 mg by mouth at bedtime.      . rosuvastatin (CRESTOR) 10 MG tablet Take 10 mg by mouth daily.      . sertraline (ZOLOFT) 100 MG tablet Take 100 mg by mouth daily.      . solifenacin (VESICARE) 10 MG tablet Take 10 mg by mouth daily.        Marland Kitchen SYNTHROID 75 MCG tablet TAKE 1 TABLET BY MOUTH DAILY  30 tablet  11  . zolpidem (AMBIEN) 10 MG tablet Take 10 mg by mouth at bedtime as needed. For sleep       No current facility-administered medications for this visit.   Family History  Problem Relation Age of Onset  . Heart attack Father 1  . Breast cancer Neg Hx   . Colon cancer Neg Hx   . Diabetes Mother   . Hypertension Mother    History   Social History  . Marital Status: Single    Spouse Name: N/A    Number of Children: N/A  . Years of Education: N/A   Social History Main Topics  . Smoking status: Former Smoker -- 1.00 packs/day for 15 years    Types: Cigarettes    Quit date: 01/18/1996  . Smokeless tobacco: Never Used  . Alcohol Use: No  . Drug Use: No  . Sexually Active: None   Other Topics Concern  . None   Social History Narrative  . None   Review of Systems: 10 pt ROS performed, pertinent positives and negatives noted in HPI Objective:  Physical Exam: Filed Vitals:   04/22/13 0937  BP: 104/64  Pulse: 88  Temp: 97.1 F (36.2 C)  TempSrc: Oral  Height: 4'  10.5" (1.486 m)  Weight: 225 lb 4.8 oz (102.195 kg)  SpO2: 97%   Vitals reviewed. General: sitting in chair, NAD HEENT: MMM of OP without erythema/exudate. TM with bilateral serous effusions, no myringitis or purulent effusions. Canals without erythema/pain/exudate. PERRL, EOMI, no scleral icterus, no erythema/drainage from eyes Cardiac: RRR, no rubs, murmurs or gallops Pulm: clear to auscultation bilaterally, no wheezes, rales, or rhonchi Abd: soft, nontender, nondistended, BS present  Assessment & Plan:   Please see problem-based charting for assessment and plan.

## 2013-04-23 NOTE — Progress Notes (Signed)
Case discussed with Dr. Heloise Beecham  immediately after she saw the patient.  We reviewed the resident's history and exam and pertinent patient test results.  I agree with the assessment, diagnosis and plan of care documented in the resident's note.  Ms. Stelzner likely has a viral URI.  Will give antiseptic ear drops as she does not have any concerning features of a bacterial ear infection.  Follow up if symptoms do not improve.

## 2013-04-26 ENCOUNTER — Other Ambulatory Visit: Payer: Self-pay | Admitting: Radiation Oncology

## 2013-04-26 ENCOUNTER — Other Ambulatory Visit: Payer: Self-pay | Admitting: Internal Medicine

## 2013-04-28 ENCOUNTER — Other Ambulatory Visit: Payer: Self-pay | Admitting: Internal Medicine

## 2013-04-29 ENCOUNTER — Other Ambulatory Visit: Payer: Self-pay | Admitting: *Deleted

## 2013-04-29 MED ORDER — AMITRIPTYLINE HCL 25 MG PO TABS: 50.0000 mg | ORAL_TABLET | Freq: Every day | ORAL | Status: DC

## 2013-05-01 ENCOUNTER — Encounter: Payer: Medicare HMO | Admitting: Internal Medicine

## 2013-05-01 ENCOUNTER — Inpatient Hospital Stay (HOSPITAL_COMMUNITY)
Admission: EM | Admit: 2013-05-01 | Discharge: 2013-05-03 | DRG: 191 | Disposition: A | Discharge status: Home / Self Care | Payer: Medicare HMO | Attending: Internal Medicine | Admitting: Internal Medicine

## 2013-05-01 ENCOUNTER — Telehealth: Payer: Self-pay | Admitting: *Deleted

## 2013-05-01 ENCOUNTER — Emergency Department (HOSPITAL_COMMUNITY): Payer: Medicare HMO

## 2013-05-01 ENCOUNTER — Encounter (HOSPITAL_COMMUNITY): Payer: Self-pay | Admitting: Emergency Medicine

## 2013-05-01 DIAGNOSIS — E1142 Type 2 diabetes mellitus with diabetic polyneuropathy: Secondary | ICD-10-CM | POA: Diagnosis present

## 2013-05-01 DIAGNOSIS — J309 Allergic rhinitis, unspecified: Secondary | ICD-10-CM | POA: Diagnosis present

## 2013-05-01 DIAGNOSIS — L719 Rosacea, unspecified: Secondary | ICD-10-CM | POA: Diagnosis present

## 2013-05-01 DIAGNOSIS — G8929 Other chronic pain: Secondary | ICD-10-CM | POA: Diagnosis present

## 2013-05-01 DIAGNOSIS — E1165 Type 2 diabetes mellitus with hyperglycemia: Secondary | ICD-10-CM | POA: Diagnosis present

## 2013-05-01 DIAGNOSIS — J441 Chronic obstructive pulmonary disease with (acute) exacerbation: Principal | ICD-10-CM | POA: Diagnosis present

## 2013-05-01 DIAGNOSIS — M51379 Other intervertebral disc degeneration, lumbosacral region without mention of lumbar back pain or lower extremity pain: Secondary | ICD-10-CM | POA: Diagnosis present

## 2013-05-01 DIAGNOSIS — Z881 Allergy status to other antibiotic agents status: Secondary | ICD-10-CM

## 2013-05-01 DIAGNOSIS — M5137 Other intervertebral disc degeneration, lumbosacral region: Secondary | ICD-10-CM | POA: Diagnosis present

## 2013-05-01 DIAGNOSIS — R0600 Dyspnea, unspecified: Secondary | ICD-10-CM | POA: Diagnosis present

## 2013-05-01 DIAGNOSIS — E119 Type 2 diabetes mellitus without complications: Secondary | ICD-10-CM

## 2013-05-01 DIAGNOSIS — J329 Chronic sinusitis, unspecified: Secondary | ICD-10-CM | POA: Diagnosis present

## 2013-05-01 DIAGNOSIS — E785 Hyperlipidemia, unspecified: Secondary | ICD-10-CM | POA: Diagnosis present

## 2013-05-01 DIAGNOSIS — M5030 Other cervical disc degeneration, unspecified cervical region: Secondary | ICD-10-CM | POA: Diagnosis present

## 2013-05-01 DIAGNOSIS — F411 Generalized anxiety disorder: Secondary | ICD-10-CM | POA: Diagnosis present

## 2013-05-01 DIAGNOSIS — J449 Chronic obstructive pulmonary disease, unspecified: Secondary | ICD-10-CM

## 2013-05-01 DIAGNOSIS — I872 Venous insufficiency (chronic) (peripheral): Secondary | ICD-10-CM

## 2013-05-01 DIAGNOSIS — Z87891 Personal history of nicotine dependence: Secondary | ICD-10-CM

## 2013-05-01 DIAGNOSIS — E669 Obesity, unspecified: Secondary | ICD-10-CM | POA: Diagnosis present

## 2013-05-01 DIAGNOSIS — Z6841 Body Mass Index (BMI) 40.0 and over, adult: Secondary | ICD-10-CM

## 2013-05-01 DIAGNOSIS — Z880 Allergy status to penicillin: Secondary | ICD-10-CM

## 2013-05-01 DIAGNOSIS — Z885 Allergy status to narcotic agent status: Secondary | ICD-10-CM

## 2013-05-01 DIAGNOSIS — Z79899 Other long term (current) drug therapy: Secondary | ICD-10-CM

## 2013-05-01 DIAGNOSIS — E039 Hypothyroidism, unspecified: Secondary | ICD-10-CM | POA: Diagnosis present

## 2013-05-01 DIAGNOSIS — D509 Iron deficiency anemia, unspecified: Secondary | ICD-10-CM | POA: Diagnosis present

## 2013-05-01 DIAGNOSIS — L0390 Cellulitis, unspecified: Secondary | ICD-10-CM

## 2013-05-01 DIAGNOSIS — Z888 Allergy status to other drugs, medicaments and biological substances status: Secondary | ICD-10-CM

## 2013-05-01 DIAGNOSIS — R0603 Acute respiratory distress: Secondary | ICD-10-CM | POA: Diagnosis present

## 2013-05-01 DIAGNOSIS — Z7982 Long term (current) use of aspirin: Secondary | ICD-10-CM

## 2013-05-01 DIAGNOSIS — E1149 Type 2 diabetes mellitus with other diabetic neurological complication: Secondary | ICD-10-CM | POA: Diagnosis present

## 2013-05-01 DIAGNOSIS — I10 Essential (primary) hypertension: Secondary | ICD-10-CM | POA: Diagnosis present

## 2013-05-01 DIAGNOSIS — R0989 Other specified symptoms and signs involving the circulatory and respiratory systems: Secondary | ICD-10-CM

## 2013-05-01 DIAGNOSIS — K219 Gastro-esophageal reflux disease without esophagitis: Secondary | ICD-10-CM | POA: Diagnosis present

## 2013-05-01 DIAGNOSIS — Z886 Allergy status to analgesic agent status: Secondary | ICD-10-CM

## 2013-05-01 DIAGNOSIS — B9689 Other specified bacterial agents as the cause of diseases classified elsewhere: Secondary | ICD-10-CM | POA: Diagnosis present

## 2013-05-01 DIAGNOSIS — E7800 Pure hypercholesterolemia, unspecified: Secondary | ICD-10-CM | POA: Diagnosis present

## 2013-05-01 DIAGNOSIS — J019 Acute sinusitis, unspecified: Secondary | ICD-10-CM | POA: Diagnosis present

## 2013-05-01 HISTORY — DX: Depression, unspecified: F32.A

## 2013-05-01 HISTORY — DX: Major depressive disorder, single episode, unspecified: F32.9

## 2013-05-01 HISTORY — DX: Chronic obstructive pulmonary disease, unspecified: J44.9

## 2013-05-01 LAB — BASIC METABOLIC PANEL
CO2: 29 mEq/L (ref 19–32)
Chloride: 96 mEq/L (ref 96–112)
Creatinine, Ser: 0.49 mg/dL — ABNORMAL LOW (ref 0.50–1.10)
GFR calc Af Amer: >90 mL/min (ref >90)
Potassium: 3.5 mEq/L (ref 3.5–5.1)
Sodium: 139 mEq/L (ref 135–145)

## 2013-05-01 LAB — URINALYSIS, ROUTINE W REFLEX MICROSCOPIC
Hgb urine dipstick: NEGATIVE
Nitrite: NEGATIVE
Specific Gravity, Urine: 1.009 (ref 1.005–1.030)
Urobilinogen, UA: 0.2 mg/dL (ref 0.0–1.0)
pH: 6 (ref 5.0–8.0)

## 2013-05-01 LAB — DIFFERENTIAL
Basophils Absolute: 0.1 10*3/uL (ref 0.0–0.1)
Eosinophils Absolute: 0.3 10*3/uL (ref 0.0–0.7)
Lymphocytes Relative: 22 % (ref 12–46)
Monocytes Absolute: 0.6 10*3/uL (ref 0.1–1.0)
Monocytes Relative: 6 % (ref 3–12)
Neutro Abs: 5.5 10*3/uL (ref 1.7–7.7)

## 2013-05-01 LAB — CBC
HCT: 32.6 % — ABNORMAL LOW (ref 36.0–46.0)
Hemoglobin: 10.9 g/dL — ABNORMAL LOW (ref 12.0–15.0)
RBC: 3.87 MIL/uL (ref 3.87–5.11)
RDW: 13.8 % (ref 11.5–15.5)
WBC: 7.9 10*3/uL (ref 4.0–10.5)

## 2013-05-01 LAB — HEPATIC FUNCTION PANEL
ALT: 18 U/L (ref 0–35)
Albumin: 3.8 g/dL (ref 3.5–5.2)
Alkaline Phosphatase: 92 U/L (ref 39–117)
Total Protein: 7 g/dL (ref 6.0–8.3)

## 2013-05-01 LAB — POCT I-STAT TROPONIN I: Troponin i, poc: 0 ng/mL (ref 0.00–0.08)

## 2013-05-01 MED ORDER — HYDROXYZINE HCL 10 MG PO TABS
10.0000 mg | ORAL_TABLET | Freq: Three times a day (TID) | ORAL | Status: DC | PRN
  Filled 2013-05-01: qty 1

## 2013-05-01 MED ORDER — METHYLPREDNISOLONE SODIUM SUCC 125 MG IJ SOLR
125.0000 mg | Freq: Once | INTRAMUSCULAR | Status: AC
  Administered 2013-05-01: 125 mg via INTRAVENOUS
  Filled 2013-05-01: qty 2

## 2013-05-01 MED ORDER — INSULIN ASPART 100 UNIT/ML ~~LOC~~ SOLN: 0.0000 [IU] | Freq: Three times a day (TID) | SUBCUTANEOUS | Status: DC

## 2013-05-01 MED ORDER — POTASSIUM CHLORIDE CRYS ER 20 MEQ PO TBCR
20.0000 meq | EXTENDED_RELEASE_TABLET | Freq: Every day | ORAL | Status: DC
  Administered 2013-05-02 – 2013-05-03 (×2): 20 meq via ORAL
  Filled 2013-05-01 (×2): qty 1

## 2013-05-01 MED ORDER — INSULIN GLARGINE 100 UNIT/ML ~~LOC~~ SOLN
5.0000 [IU] | Freq: Every day | SUBCUTANEOUS | Status: DC
  Administered 2013-05-02 (×2): 5 [IU] via SUBCUTANEOUS
  Filled 2013-05-01 (×3): qty 0.05

## 2013-05-01 MED ORDER — ALBUTEROL (5 MG/ML) CONTINUOUS INHALATION SOLN
10.0000 mg/h | INHALATION_SOLUTION | Freq: Once | RESPIRATORY_TRACT | Status: AC
  Administered 2013-05-01: 10 mg/h via RESPIRATORY_TRACT
  Filled 2013-05-01: qty 20

## 2013-05-01 MED ORDER — ERYTHROMYCIN BASE 250 MG PO TABS
250.0000 mg | ORAL_TABLET | Freq: Two times a day (BID) | ORAL | Status: DC
  Administered 2013-05-02 – 2013-05-03 (×3): 250 mg via ORAL
  Filled 2013-05-01 (×5): qty 1

## 2013-05-01 MED ORDER — ALBUTEROL SULFATE (5 MG/ML) 0.5% IN NEBU
5.0000 mg | INHALATION_SOLUTION | Freq: Once | RESPIRATORY_TRACT | Status: AC
  Administered 2013-05-01: 5 mg via RESPIRATORY_TRACT
  Filled 2013-05-01: qty 0.5

## 2013-05-01 MED ORDER — ALPRAZOLAM 0.5 MG PO TABS
1.0000 mg | ORAL_TABLET | Freq: Four times a day (QID) | ORAL | Status: DC | PRN
  Administered 2013-05-02 (×2): 1 mg via ORAL
  Filled 2013-05-01 (×3): qty 2

## 2013-05-01 MED ORDER — ENALAPRIL MALEATE 5 MG PO TABS
5.0000 mg | ORAL_TABLET | Freq: Every day | ORAL | Status: DC
  Administered 2013-05-02 – 2013-05-03 (×2): 5 mg via ORAL
  Filled 2013-05-01 (×2): qty 1

## 2013-05-01 MED ORDER — AMITRIPTYLINE HCL 50 MG PO TABS
50.0000 mg | ORAL_TABLET | Freq: Every day | ORAL | Status: DC
  Administered 2013-05-02 (×2): 50 mg via ORAL
  Filled 2013-05-01 (×3): qty 1

## 2013-05-01 MED ORDER — HYDROMORPHONE HCL 2 MG PO TABS
2.0000 mg | ORAL_TABLET | Freq: Three times a day (TID) | ORAL | Status: DC | PRN
Start: 2013-05-01 — End: 2013-05-02
  Administered 2013-05-01: 2 mg via ORAL
  Filled 2013-05-01: qty 1

## 2013-05-01 MED ORDER — ERYTHROMYCIN ETHYLSUCCINATE 400 MG PO TABS: 400.0000 mg | ORAL_TABLET | Freq: Two times a day (BID) | ORAL | Status: DC

## 2013-05-01 MED ORDER — ASPIRIN 81 MG PO CHEW
81.0000 mg | CHEWABLE_TABLET | Freq: Every day | ORAL | Status: DC
  Administered 2013-05-02: 81 mg via ORAL
  Filled 2013-05-01: qty 1

## 2013-05-01 MED ORDER — DOXYCYCLINE HYCLATE 100 MG PO TABS
100.0000 mg | ORAL_TABLET | Freq: Two times a day (BID) | ORAL | Status: DC
  Administered 2013-05-02 – 2013-05-03 (×4): 100 mg via ORAL
  Filled 2013-05-01 (×5): qty 1

## 2013-05-01 MED ORDER — IPRATROPIUM BROMIDE 0.02 % IN SOLN
0.5000 mg | Freq: Once | RESPIRATORY_TRACT | Status: AC
  Administered 2013-05-01: 0.5 mg via RESPIRATORY_TRACT
  Filled 2013-05-01: qty 2.5

## 2013-05-01 MED ORDER — PREDNISONE 50 MG PO TABS
60.0000 mg | ORAL_TABLET | Freq: Every day | ORAL | Status: DC
  Administered 2013-05-02 – 2013-05-03 (×2): 60 mg via ORAL
  Filled 2013-05-01 (×3): qty 1

## 2013-05-01 MED ORDER — SODIUM CHLORIDE 0.9 % IJ SOLN: 3.0000 mL | INTRAMUSCULAR | Status: DC | PRN

## 2013-05-01 MED ORDER — ALBUTEROL SULFATE HFA 108 (90 BASE) MCG/ACT IN AERS
2.0000 | INHALATION_SPRAY | RESPIRATORY_TRACT | Status: DC | PRN
  Filled 2013-05-01: qty 6.7

## 2013-05-01 MED ORDER — SODIUM CHLORIDE 0.9 % IV SOLN: 250.0000 mL | INTRAVENOUS | Status: DC | PRN

## 2013-05-01 MED ORDER — ALBUTEROL SULFATE (5 MG/ML) 0.5% IN NEBU
2.5000 mg | INHALATION_SOLUTION | Freq: Four times a day (QID) | RESPIRATORY_TRACT | Status: DC
  Administered 2013-05-02: 2.5 mg via RESPIRATORY_TRACT
  Filled 2013-05-01: qty 0.5

## 2013-05-01 MED ORDER — HYDROMORPHONE HCL PF 1 MG/ML IJ SOLN
1.0000 mg | Freq: Once | INTRAMUSCULAR | Status: AC
  Administered 2013-05-01: 1 mg via INTRAVENOUS
  Filled 2013-05-01: qty 1

## 2013-05-01 MED ORDER — PANTOPRAZOLE SODIUM 40 MG PO TBEC
40.0000 mg | DELAYED_RELEASE_TABLET | Freq: Every day | ORAL | Status: DC
  Administered 2013-05-02: 40 mg via ORAL
  Filled 2013-05-01: qty 1

## 2013-05-01 MED ORDER — SODIUM CHLORIDE 0.9 % IJ SOLN
3.0000 mL | Freq: Two times a day (BID) | INTRAMUSCULAR | Status: DC
  Administered 2013-05-02 (×2): 3 mL via INTRAVENOUS

## 2013-05-01 MED ORDER — VANCOMYCIN HCL IN DEXTROSE 1-5 GM/200ML-% IV SOLN
1000.0000 mg | Freq: Once | INTRAVENOUS | Status: DC
  Filled 2013-05-01: qty 200

## 2013-05-01 MED ORDER — INSULIN ASPART 100 UNIT/ML ~~LOC~~ SOLN
0.0000 [IU] | Freq: Three times a day (TID) | SUBCUTANEOUS | Status: DC
  Administered 2013-05-02: 8 [IU] via SUBCUTANEOUS
  Administered 2013-05-02: 5 [IU] via SUBCUTANEOUS
  Administered 2013-05-02: 8 [IU] via SUBCUTANEOUS
  Administered 2013-05-03: 3 [IU] via SUBCUTANEOUS

## 2013-05-01 MED ORDER — FUROSEMIDE 40 MG PO TABS
60.0000 mg | ORAL_TABLET | Freq: Every day | ORAL | Status: DC
  Administered 2013-05-02 – 2013-05-03 (×2): 60 mg via ORAL
  Filled 2013-05-01 (×2): qty 1

## 2013-05-01 MED ORDER — METHOCARBAMOL 500 MG PO TABS
500.0000 mg | ORAL_TABLET | Freq: Two times a day (BID) | ORAL | Status: DC
Start: 2013-05-01 — End: 2013-05-03
  Administered 2013-05-02 – 2013-05-03 (×4): 500 mg via ORAL
  Filled 2013-05-01 (×5): qty 1

## 2013-05-01 MED ORDER — SERTRALINE HCL 100 MG PO TABS
100.0000 mg | ORAL_TABLET | Freq: Every day | ORAL | Status: DC
Start: 2013-05-02 — End: 2013-05-03
  Administered 2013-05-02 – 2013-05-03 (×2): 100 mg via ORAL
  Filled 2013-05-01 (×2): qty 1

## 2013-05-01 MED ORDER — ROPINIROLE HCL 1 MG PO TABS
3.0000 mg | ORAL_TABLET | Freq: Every day | ORAL | Status: DC
  Administered 2013-05-02 (×2): 3 mg via ORAL
  Filled 2013-05-01 (×3): qty 3

## 2013-05-01 MED ORDER — SODIUM CHLORIDE 0.9 % IV SOLN
Freq: Once | INTRAVENOUS | Status: AC
  Administered 2013-05-01: 18:00:00 via INTRAVENOUS

## 2013-05-01 MED ORDER — ONDANSETRON HCL 4 MG/2ML IJ SOLN
4.0000 mg | Freq: Once | INTRAMUSCULAR | Status: AC
  Administered 2013-05-01: 4 mg via INTRAVENOUS
  Filled 2013-05-01: qty 2

## 2013-05-01 MED ORDER — ENOXAPARIN SODIUM 40 MG/0.4ML ~~LOC~~ SOLN
40.0000 mg | Freq: Every day | SUBCUTANEOUS | Status: DC
  Administered 2013-05-02: 40 mg via SUBCUTANEOUS
  Filled 2013-05-01 (×2): qty 0.4

## 2013-05-01 MED ORDER — IPRATROPIUM BROMIDE 0.02 % IN SOLN
0.5000 mg | Freq: Four times a day (QID) | RESPIRATORY_TRACT | Status: DC
  Administered 2013-05-02: 0.5 mg via RESPIRATORY_TRACT
  Filled 2013-05-01: qty 2.5

## 2013-05-01 MED ORDER — LEVOTHYROXINE SODIUM 75 MCG PO TABS
75.0000 ug | ORAL_TABLET | Freq: Every day | ORAL | Status: DC
  Administered 2013-05-02 – 2013-05-03 (×2): 75 ug via ORAL
  Filled 2013-05-01 (×3): qty 1

## 2013-05-01 MED ORDER — DARIFENACIN HYDROBROMIDE ER 15 MG PO TB24
15.0000 mg | ORAL_TABLET | Freq: Every day | ORAL | Status: DC
  Administered 2013-05-02 – 2013-05-03 (×2): 15 mg via ORAL
  Filled 2013-05-01 (×2): qty 1

## 2013-05-01 MED ORDER — DOXYCYCLINE HYCLATE 100 MG PO TABS: 100.0000 mg | ORAL_TABLET | Freq: Two times a day (BID) | ORAL | Status: DC

## 2013-05-01 MED ORDER — ATORVASTATIN CALCIUM 20 MG PO TABS
20.0000 mg | ORAL_TABLET | Freq: Every day | ORAL | Status: DC
  Administered 2013-05-02: 20 mg via ORAL
  Filled 2013-05-01 (×2): qty 1

## 2013-05-01 NOTE — ED Notes (Signed)
Pt presents to ED for evaluation of increased shortness of breath for the past week- hx of asthma.  Pt has ran out of home neb treatments, EMS found pt to wheezing- 5 of Albuterol administered, denies hearing wheezing afterwards.  IV in place.  96% on RA, no respiratory distress noted.

## 2013-05-01 NOTE — ED Provider Notes (Signed)
History     CSN: [id number]  Arrival date & time 05/01/13  1558   None     Chief Complaint  Patient presents with  . Shortness of Breath    (Consider location/radiation/quality/duration/timing/severity/associated sxs/prior treatment) Patient is a 52 y.o. female presenting with shortness of breath. The history is provided by the patient and medical records. No language interpreter was used.  Shortness of Breath Severity:  Severe Onset quality:  Gradual (Pt has been having wheezing and shortness of breath for a week.  She has run out of asthma medications at home.  She therefore seeks evaluation and treatment.  1) Duration:  1 week Timing:  Constant Progression:  Worsening Chronicity:  Recurrent Relieved by:  Nothing Worsened by:  Nothing tried Associated symptoms: wheezing   Associated symptoms comment:  Redness and swelling of both legs. Risk factors: obesity   Risk factors comment:  Former smoker.   Past Medical History  Diagnosis Date  . Diabetes mellitus, type 2   . Hypertension   . Unspecified asthma(493.90)   . Anxiety state, unspecified   . Hypercholesteremia   . Hypothyroidism   . Obesity   . GERD (gastroesophageal reflux disease)   . Iron deficiency anemia   . DDD (degenerative disc disease), lumbar   . Spinal stenosis   . Low back pain   . Degeneration of cervical intervertebral disc   . Left knee pain     Following fall 11/15/2009.  Marland Kitchen Acne rosacea   . Heme positive stool     EGD on 04/08/2010 by Dr. Ewing Schlein showed chronic gastritis and a few gastric polyps; pathology showed a fundic gland polyp.  Colonoscopy on 04/08/2010 showed small external and internal hemorrhoids, and a few benign-appearing diminutive polyps in the rectum, the distal sigmoid colon, and in the distal descending colon; pathology showed hyperplastic polyps.   . Hematochezia   . Irregular menses   . Vagina bleeding   . Atrophic vaginitis   . Viral warts, unspecified     Right hand.  .  Chronic interstitial cystitis     Followed by Dr. Marcine Matar  . Elevated transaminase level     Mild elevation, AST=48, ALT=51 on 02/01/07.  . Allergic rhinitis   . Carpal tunnel syndrome   . Atypical chest pain 12/11/2008    Normal stress nuclear study 01/12/2009 by Dr. Peter Swaziland  . Bilateral leg edema   . COPD (chronic obstructive pulmonary disease)     Past Surgical History  Procedure Laterality Date  . Carpal tunnel release  05/08/2000    By Dr. Katy Fitch. Sypher, Montez Hageman.    Family History  Problem Relation Age of Onset  . Heart attack Father 17  . Breast cancer Neg Hx   . Colon cancer Neg Hx   . Diabetes Mother   . Hypertension Mother     History  Substance Use Topics  . Smoking status: Former Smoker -- 1.00 packs/day for 15 years    Types: Cigarettes    Quit date: 01/18/1996  . Smokeless tobacco: Never Used  . Alcohol Use: No    OB History   Grav Para Term Preterm Abortions TAB SAB Ect Mult Living                  Review of Systems  Constitutional: Negative for chills.  HENT: Negative.   Eyes: Negative.   Respiratory: Positive for shortness of breath and wheezing.   Cardiovascular: Positive for leg swelling.  Gastrointestinal: Negative.  Genitourinary: Negative.   Musculoskeletal: Negative.   Skin:       Redness and swelling of both legs.  Neurological: Negative.   Psychiatric/Behavioral: Negative.     Allergies  Codeine sulfate; Meperidine and related; Minocycline hcl; Penicillins; Propoxyphene-acetaminophen; Tetracycline hcl; and Vicodin  Home Medications   Current Outpatient Rx  Name  Route  Sig  Dispense  Refill  . acetic acid-hydrocortisone (VOSOL-HC) otic solution   Both Ears   Place 4 drops into both ears 2 (two) times daily. For 3-5 days   10 mL   0   . albuterol (PROAIR HFA) 108 (90 BASE) MCG/ACT inhaler   Inhalation   Inhale 1-2 puffs into the lungs every 6 (six) hours as needed for shortness of breath.   18 g   11   .  ALPRAZolam (XANAX) 1 MG tablet   Oral   Take 1 mg by mouth 4 (four) times daily as needed. For anxiety         . amitriptyline (ELAVIL) 25 MG tablet   Oral   Take 2 tablets (50 mg total) by mouth at bedtime.   60 tablet   0   . aspirin 81 MG chewable tablet   Oral   Chew 81 mg by mouth daily.          . carisoprodol (SOMA) 350 MG tablet      TAKE 1 TABLET BY MOUTH 4 TIMES A DAY AS NEEDED FOR MUSCLE SPASMS   120 tablet   2   . enalapril (VASOTEC) 5 MG tablet      TAKE 1 TABLET EVERY DAY   30 tablet   6   . erythromycin ethylsuccinate (EES) 400 MG tablet   Oral   Take 400 mg by mouth 2 (two) times daily. Maintenance for Rosacea         . esomeprazole (NEXIUM) 40 MG capsule   Oral   Take 1 capsule (40 mg total) by mouth daily before breakfast.   31 capsule   11   . fluticasone (FLONASE) 50 MCG/ACT nasal spray   Nasal   Place 2 sprays into the nose daily as needed. For allergies         . fluticasone (FLOVENT DISKUS) 50 MCG/BLIST diskus inhaler   Inhalation   Inhale 1 puff into the lungs 2 (two) times daily.         . furosemide (LASIX) 20 MG tablet      TAKE 3 TABLETS (60 MG TOTAL) BY MOUTH DAILY.   90 tablet   0   . glucose blood (ACCU-CHEK COMPACT TEST DRUM) test strip      Use to Check Blood Glucose Daily.  ICD Code: 250.00, not insulin dependent   100 each   3   . guaiFENesin (MUCINEX) 600 MG 12 hr tablet   Oral   Take 2 tablets (1,200 mg total) by mouth 2 (two) times daily.   20 tablet   0   . HYDROmorphone (DILAUDID) 2 MG tablet   Oral   Take 1 tablet (2 mg total) by mouth every 8 (eight) hours as needed for pain.   90 tablet   0   . hydrOXYzine (ATARAX/VISTARIL) 10 MG tablet   Oral   Take 1 tablet (10 mg total) by mouth 3 (three) times daily as needed for itching.   90 tablet   1   . ipratropium (ATROVENT HFA) 17 MCG/ACT inhaler   Inhalation   Inhale 2 puffs into the  lungs 4 (four) times daily as needed. For wheezing          . KRISTALOSE 20 G packet   Oral   Take 20 g by mouth 2 (two) times daily as needed (constipation).          Demetra Shiner Devices (ACCU-CHEK SOFTCLIX) lancets      Use to Check Blood Glucose Daily.  ICD Code: 250.00   100 each   3   . metFORMIN (GLUCOPHAGE-XR) 500 MG 24 hr tablet      TAKE 3 TABLETS BY MOUTH DAILY   90 tablet   6   . methocarbamol (ROBAXIN) 500 MG tablet   Oral   Take 1 tablet (500 mg total) by mouth 2 (two) times daily.   20 tablet   0   . potassium chloride (K-DUR,KLOR-CON) 10 MEQ tablet      TAKE 2 TABLETS BY MOUTH 2 TIMES DAILY.   120 tablet   2   . rOPINIRole (REQUIP) 3 MG tablet   Oral   Take 3 mg by mouth at bedtime.         . rosuvastatin (CRESTOR) 10 MG tablet   Oral   Take 10 mg by mouth daily.         . sertraline (ZOLOFT) 100 MG tablet   Oral   Take 100 mg by mouth daily.         . solifenacin (VESICARE) 10 MG tablet   Oral   Take 10 mg by mouth daily.           Marland Kitchen SYNTHROID 75 MCG tablet      TAKE 1 TABLET BY MOUTH DAILY   30 tablet   11     Dispense as written.   . zolpidem (AMBIEN) 10 MG tablet   Oral   Take 10 mg by mouth at bedtime as needed. For sleep           BP 115/48  Pulse 110  Temp(Src) 98.8 F (37.1 C) (Oral)  Resp 20  SpO2 94%  LMP 01/17/2009  Physical Exam  Constitutional: She appears well-developed and well-nourished.  In moderate distress with shortness of breath, taking albuterol nebulizer treatment.  HENT:  Head: Normocephalic and atraumatic.  Right Ear: External ear normal.  Left Ear: External ear normal.  Mouth/Throat: Oropharynx is clear and moist.  Eyes: Conjunctivae and EOM are normal. Pupils are equal, round, and reactive to light.  Neck: Normal range of motion. Neck supple.  Cardiovascular: Normal rate, regular rhythm and normal heart sounds.   Pulmonary/Chest:  Coarse rhonchi and wheezes over both lung fields.  Abdominal: Soft. Bowel sounds are normal.  Musculoskeletal:   She has redness and swelling of both lower legs up to her knees.  Skin: Skin is warm and dry.  Psychiatric: She has a normal mood and affect. Her behavior is normal.    ED Course  Procedures (including critical care time)  Labs Reviewed  BASIC METABOLIC PANEL  CBC   Dg Chest 2 View (if Patient Has Fever And/or Copd)  05/01/2013   *RADIOLOGY REPORT*  Clinical Data: Shortness of breath  CHEST - 2 VIEW  Comparison: 03/05/2013  Findings: The heart size and mediastinal contours are within normal limits.  Both lungs are clear. Spondylosis noted within the thoracic spine.  IMPRESSION: Negative exam.   Original Report Authenticated By: Signa Kell, M.D.    4:46 PM  Date: 05/01/2013  Rate:110  Rhythm: sinus tachycardia  QRS Axis: normal  Intervals: QT prolonged  ST/T Wave abnormalities: normal  Conduction Disutrbances:none  Narrative Interpretation: Borderline EKG  Old EKG Reviewed: unchanged  5:35 PM Pt was seen and had physical examination.  Lab workup ordered.  Continuous albuterol nebulizer treatment, IV Solumedrol ordered.  Old charts reviewed.  7:21 PM Breathing somewhat better after nebulizer treatment and Solumedrol.    Results for orders placed during the hospital encounter of 05/01/13  BASIC METABOLIC PANEL      Result Value Range   Sodium 139  135 - 145 mEq/L   Potassium 3.5  3.5 - 5.1 mEq/L   Chloride 96  96 - 112 mEq/L   CO2 29  19 - 32 mEq/L   Glucose, Bld 163 (*) 70 - 99 mg/dL   BUN 6  6 - 23 mg/dL   Creatinine, Ser 5.28 (*) 0.50 - 1.10 mg/dL   Calcium 9.1  8.4 - 41.3 mg/dL   GFR calc non Af Amer >90  >90 mL/min   GFR calc Af Amer >90  >90 mL/min  CBC      Result Value Range   WBC 7.9  4.0 - 10.5 K/uL   RBC 3.87  3.87 - 5.11 MIL/uL   Hemoglobin 10.9 (*) 12.0 - 15.0 g/dL   HCT 24.4 (*) 01.0 - 27.2 %   MCV 84.2  78.0 - 100.0 fL   MCH 28.2  26.0 - 34.0 pg   MCHC 33.4  30.0 - 36.0 g/dL   RDW 53.6  64.4 - 03.4 %   Platelets 228  150 - 400 K/uL  POCT I-STAT  TROPONIN I      Result Value Range   Troponin i, poc 0.00  0.00 - 0.08 ng/mL   Comment 3            Dg Chest 2 View (if Patient Has Fever And/or Copd)  05/01/2013   *RADIOLOGY REPORT*  Clinical Data: Shortness of breath  CHEST - 2 VIEW  Comparison: 03/05/2013  Findings: The heart size and mediastinal contours are within normal limits.  Both lungs are clear. Spondylosis noted within the thoracic spine.  IMPRESSION: Negative exam.   Original Report Authenticated By: Signa Kell, M.D.   7:22 PM Will call Internal Medicine Teaching Service to see her regarding COPD exacerbation, Cellulitis of both legs.    1. COPD (chronic obstructive pulmonary disease)   2. Cellulitis            Carleene Cooper III, MD 05/01/13 [phone]

## 2013-05-01 NOTE — ED Notes (Signed)
Admitting MD at bedside.

## 2013-05-01 NOTE — Telephone Encounter (Signed)
Pt calls and states she is no better from the appt 6/3, thinks she has pneumonia, she is offered and scheduled an appt for this pm, she then calls back and states she will need to cancel due to transportation and will call an ambulance to bring her to ED.

## 2013-05-01 NOTE — ED Notes (Signed)
Dr. Davidson at bedside.

## 2013-05-01 NOTE — ED Notes (Signed)
Respiratory called for breathing tx

## 2013-05-01 NOTE — ED Notes (Signed)
MD at bedside. Internal medicine

## 2013-05-01 NOTE — H&P (Signed)
Date: 05/01/2013               Patient Name:  Mary Gray MRN: [id number]  DOB: August 16, 1961 Age / Sex: 52 y.o., female   PCP: Farley Ly, MD              Medical Service: Internal Medicine Teaching Service              Attending Physician: Dr. Farley Ly, MD    First Contact: Dr. Lavena Bullion Pager: [phone]  Second Contact: Dr. Dorise Hiss Pager: [phone]            After Hours (After 5p/  First Contact Pager: [phone]  weekends / holidays): Second Contact Pager: [phone]   Chief Complaint: Shortness of breath and bilateral LE pain  History of Present Illness: 52 year old female with a PMH of DM 2, Asthma, ?COPD,  Hypothyroidism, diabetic neuropathy, anxiety, degenerative disc disease, spinal stenosis, and chronic bilateral venous statis dermatitis, who presents with 3 weeks history of shortness of breath and increased bilateral LE pain.  Shortness of breath Pt has SOB and cough at baseline which has been attributed to COPD, and asthma. However, 3 weeks ago her symptoms have increased. She also reports postnasal drainage, nasal congestion and mucus drainage. She admits to wheezing and green sputum production which has not increased in amount. No hemoptysis. She also reports bilateral chest pain with cough. She admits to a sore throat, nausea but no vomiting or abdominal pain. Other symptoms include fatigue, reduced oral intake, subjective fevers and chills. She has a history of contact with her mother, who had similar symptoms prior. She was evaluated in outpatient clinic on 04/22/2013 for a viral URI and given Mucinex without improvement. She has increased the frequence of her albuterol inhaler from twice a day to 4 times without improvement.  Bilateral LE pain She described increased bilateral lower extremity pain, which she rates 8/10, constant, burning and dull, increased with touch and movement/walking. The pain improves with rest . She also reports increased red coloration of  both her legs. No history of increased weakness, but she describes numbness. She has a documented history of venous statis dermatis (by Dr Terri Piedra of dermatology) due to ? chronic venous insufficiency. Lower extremity Dopplers on 03/05/2013 where negative for DVT. Cellulitis has been felt unlikely from previous and multiple clinic evaluations. She was advised to use pressure stockings which she has tried without relief. She has also been trying warm compressions.    Current Outpatient Prescriptions  Medication Sig Dispense Refill  . acetic acid-hydrocortisone (VOSOL-HC) otic solution Place 4 drops into both ears 2 (two) times daily. For 3-5 days  10 mL  0  . albuterol (PROAIR HFA) 108 (90 BASE) MCG/ACT inhaler Inhale 1-2 puffs into the lungs every 6 (six) hours as needed for shortness of breath.  18 g  11  . ALPRAZolam (XANAX) 1 MG tablet Take 1 mg by mouth 4 (four) times daily as needed. For anxiety      . amitriptyline (ELAVIL) 25 MG tablet Take 2 tablets (50 mg total) by mouth at bedtime.  60 tablet  0  . aspirin 81 MG chewable tablet Chew 81 mg by mouth daily.       . carisoprodol (SOMA) 350 MG tablet TAKE 1 TABLET BY MOUTH 4 TIMES A DAY AS NEEDED FOR MUSCLE SPASMS  120 tablet  2  . enalapril (VASOTEC) 5 MG tablet TAKE 1 TABLET EVERY DAY  30 tablet  6  .  erythromycin ethylsuccinate (EES) 400 MG tablet Take 400 mg by mouth 2 (two) times daily. Maintenance for Rosacea      . esomeprazole (NEXIUM) 40 MG capsule Take 1 capsule (40 mg total) by mouth daily before breakfast.  31 capsule  11  . fluticasone (FLOVENT DISKUS) 50 MCG/BLIST diskus inhaler Inhale 1 puff into the lungs 2 (two) times daily.      . fluticasone (FLONASE) 50 MCG/ACT nasal spray Place 2 sprays into the nose daily as needed. For allergies      . furosemide (LASIX) 20 MG tablet TAKE 3 TABLETS (60 MG TOTAL) BY MOUTH DAILY.  90 tablet  0  . guaiFENesin (MUCINEX) 600 MG 12 hr tablet Take 2 tablets (1,200 mg total) by mouth 2 (two) times  daily.  20 tablet  0  . HYDROmorphone (DILAUDID) 2 MG tablet Take 1 tablet (2 mg total) by mouth every 8 (eight) hours as needed for pain.  90 tablet  0  . hydrOXYzine (ATARAX/VISTARIL) 10 MG tablet Take 1 tablet (10 mg total) by mouth 3 (three) times daily as needed for itching.  90 tablet  1  . ipratropium (ATROVENT HFA) 17 MCG/ACT inhaler Inhale 2 puffs into the lungs 4 (four) times daily as needed. For wheezing      . KRISTALOSE 20 G packet Take 20 g by mouth 2 (two) times daily as needed (constipation).       . metFORMIN (GLUCOPHAGE-XR) 500 MG 24 hr tablet TAKE 3 TABLETS BY MOUTH DAILY  90 tablet  6  . methocarbamol (ROBAXIN) 500 MG tablet Take 1 tablet (500 mg total) by mouth 2 (two) times daily.  20 tablet  0  . rOPINIRole (REQUIP) 3 MG tablet Take 3 mg by mouth at bedtime.      . rosuvastatin (CRESTOR) 10 MG tablet Take 10 mg by mouth daily.      . sertraline (ZOLOFT) 100 MG tablet Take 100 mg by mouth daily.      . solifenacin (VESICARE) 10 MG tablet Take 10 mg by mouth daily.        Marland Kitchen SYNTHROID 75 MCG tablet TAKE 1 TABLET BY MOUTH DAILY  30 tablet  11    Allergies: Allergies as of 05/01/2013 - Review Complete 05/01/2013  Allergen Reaction Noted  . Codeine sulfate Other (See Comments) 06/27/2006  . Meperidine and related Nausea And Vomiting 12/30/2011  . Minocycline hcl Nausea And Vomiting 06/27/2006  . Penicillins Other (See Comments) 06/27/2006  . Propoxyphene-acetaminophen Other (See Comments) 06/27/2006  . Tetracycline hcl Other (See Comments) 06/27/2006  . Vicodin (hydrocodone-acetaminophen) Other (See Comments) 04/27/2011   Past Medical History  Diagnosis Date  . Diabetes mellitus, type 2   . Hypertension   . Unspecified asthma(493.90)   . Anxiety state, unspecified   . Hypercholesteremia   . Hypothyroidism   . Obesity   . GERD (gastroesophageal reflux disease)   . Iron deficiency anemia   . DDD (degenerative disc disease), lumbar   . Spinal stenosis   . Low back  pain   . Degeneration of cervical intervertebral disc   . Left knee pain     Following fall 11/15/2009.  Marland Kitchen Acne rosacea   . Heme positive stool     EGD on 04/08/2010 by Dr. Ewing Schlein showed chronic gastritis and a few gastric polyps; pathology showed a fundic gland polyp.  Colonoscopy on 04/08/2010 showed small external and internal hemorrhoids, and a few benign-appearing diminutive polyps in the rectum, the distal sigmoid colon, and  in the distal descending colon; pathology showed hyperplastic polyps.   . Hematochezia   . Irregular menses   . Vagina bleeding   . Atrophic vaginitis   . Viral warts, unspecified     Right hand.  . Chronic interstitial cystitis     Followed by Dr. Marcine Matar  . Elevated transaminase level     Mild elevation, AST=48, ALT=51 on 02/01/07.  . Allergic rhinitis   . Carpal tunnel syndrome   . Atypical chest pain 12/11/2008    Normal stress nuclear study 01/12/2009 by Dr. Peter [place]  . Bilateral leg edema   . COPD (chronic obstructive pulmonary disease)    Past Surgical History  Procedure Laterality Date  . Carpal tunnel release  05/08/2000    By Dr. Katy Fitch. Sypher, Montez Hageman.   Family History  Problem Relation Age of Onset  . Heart attack Father 53  . Breast cancer Neg Hx   . Colon cancer Neg Hx   . Diabetes Mother   . Hypertension Mother    History   Social History  . Marital Status: Single    Spouse Name: N/A    Number of Children: N/A  . Years of Education: N/A   Occupational History  . Not on file.   Social History Main Topics  . Smoking status: Former Smoker -- 1.00 packs/day for 15 years    Types: Cigarettes    Quit date: 01/18/1996  . Smokeless tobacco: Never Used  . Alcohol Use: No  . Drug Use: No  . Sexually Active: Not on file   Other Topics Concern  . Not on file   Social History Narrative  . No narrative on file    Review of Systems: Constitutional: Reports, appetite change and fatigue.  HEENT: Denies photophobia, eye  pain, redness, hearing loss, ear pain, neck pain, neck stiffness and tinnitus.  Cardiovascular: Denies chest pain, palpitations. Reports orthopnea but this has remained stable. Gastrointestinal: Denies vomiting, abdominal pain and diarrhea. Reports constipation with occasional blood in stool . No abdominal distention.  Genitourinary: Denies dysuria, urgency, frequency, hematuria, flank pain and difficulty urinating.  Musculoskeletal: Denies myalgias, joint swelling, arthralgias and gait problem.  Skin: Denies pallor, rash and wound.  Neurological: Denies dizziness, seizures, syncope, weakness, lightheadedness, numbness and headaches.  Hematological: Denies adenopathy. Easy bruising, personal or family bleeding history  Psychiatric/Behavioral: Denies suicidal ideation, mood changes, confusion, nervousness, sleep disturbance and agitation   Physical Exam: VS BP 119/58, pulse 115, Temp 98.8 F, RR 21, and SpO2 96.00% on room air   General: Obese, well nourished; no acute distressed, cooperative with exam Head: atraumatic, normocephalic,  Eye: pupils equal, round and reactive; sclera anicteric; normal conjunctiva  Nose/throat: oropharynx clear, moist mucous membranes, pink gums  Neck: supple, no carotid bruits, thyroid normal in size and without palpable nodules  Lymph nodes: no cervical or supraclavicular lymphadenopathy  Lungs/Chest wall: Expiratory wheezing bilaterally, normal work of breathing. No chest wall tenderness. Heart: normal rate and regular rhythm; no murmurs Pulses: radial and dorsalis pedis pulses are 2+ and symmetric  Abdomen: Normal fullness, no rebound, guarding, or rigidity; normal bowel sounds; no masses or organomegaly  Skin: warm, dry, intact, normal turgor, no rashes  Extremities: Bilateral mild pitting peripheral edema, no signs of active infection like erythema or warmth. No cyanosis. Pulse are present and strong. No wounds.  Neurologic: A&O X3, CN II - XII are  grossly intact. Motor strength is 5/5 in the all 4 extremities, Sensations intact to light touch,  Cerebellar signs negative.  Psych: patient is alert and oriented, mood and affect are normal and congruent, thought content is normal without delusions, thought process is linear, speech is normal and non-pressured, behavior is normal   Lab results: Basic Metabolic Panel:  Recent Labs  96/04/54 1715  NA 139  K 3.5  CL 96  CO2 29  GLUCOSE 163*  BUN 6  CREATININE 0.49*  CALCIUM 9.1   Liver Function Tests:  Recent Labs  05/01/13 1715  AST 31  ALT 18  ALKPHOS 92  BILITOT 0.2*  PROT 7.0  ALBUMIN 3.8     CBC:  Recent Labs  05/01/13 1715  WBC 7.9  NEUTROABS 5.5  HGB 10.9*  HCT 32.6*  MCV 84.2  PLT 228     Urinalysis:  Recent Labs  05/01/13 1915  COLORURINE YELLOW  LABSPEC 1.009  PHURINE 6.0  GLUCOSEU 100*  HGBUR NEGATIVE  BILIRUBINUR NEGATIVE  KETONESUR NEGATIVE  PROTEINUR NEGATIVE  UROBILINOGEN 0.2  NITRITE NEGATIVE  LEUKOCYTESUR NEGATIVE    Imaging results:  Dg Chest 2 View (if Patient Has Fever And/or Copd)  05/01/2013   *RADIOLOGY REPORT*  Clinical Data: Shortness of breath  CHEST - 2 VIEW  Comparison: 03/05/2013  Findings: The heart size and mediastinal contours are within normal limits.  Both lungs are clear. Spondylosis noted within the thoracic spine.  IMPRESSION: Negative exam.   Original Report Authenticated By: Signa Kell, M.D.    Other results: EKG: Sinus rhythm, nonspecific ST and T waves changes, sinus tachycardia, VR 107, poor R wave progression. RAD. Q waves in II and aVL. Worsening of poor R wave progression compared to EKG 07/2012.  Assessment & Plan by Problem: Principal Problem:   Respiratory distress Active Problems:   HYPOTHYROIDISM   DIABETES MELLITUS, TYPE II   HYPERCHOLESTEROLEMIA   OBESITY   ANEMIA, IRON DEFICIENCY   ANXIETY DISORDER   HYPERTENSION   ALLERGIC RHINITIS   ASTHMA NOS W/O STATUS ASTHMATICUS   GERD    Venous stasis dermatitis   Acute bacterial rhinosinusitis   52 year old female with a PMH of DM 2, Asthma, ?COPD,  Hypothyroidism, diabetic neuropathy, anxiety, degenerative disc disease, spinal stenosis, and chronic bilateral venous statis dermatitis, who presents with 3 weeks history of shortness of breath and increased bilateral LE pain.  #SOB: Likely etiology include acute asthma versus COPD exacerbation triggered by URI. Physical exam marked for expiratory wheezes. PEFR = 250 in ED, unknown baseline. Chesty X-ray - negative. No leukocytosis. Echo in 04/2012 revealed grade 1 diastolic dysfunction, with an EF of 60-65% making CHF exacerbation unlikely. Also ACS unlikely - no EKG significant changed and neg trop. Her risk factors for ACS include hypertension, DM, HLD and obesity. Modified Geneva score is 28% pretest probability for PE (1 point for tachycardia). Will not pursue evaluation for PET. Tachycardia, likely due to asthma, or COPD exacerbation. Marland Kitchen Her home regimen for COPD includes albuterol, Atrovent, and Flovent.   Plan  - Admitted to telemetry  - DuoNebs q6hrs scheduled  - Albuterol MDI q4hrs prn - Po prednisone 60 mg daily. She received 1 dose of Solumedrol in ED - Doxycycline 100 mg bid - Cycle cardiac enzymes. - Daily weights. - Oxygen therapy as needed to keep O2 saturations 88-92%. - ProBNP  # Bilateral Venous Statis Dermatitis: Other differentials including CHF, cellulitis, lymphedema, myxedema are unlikely. She is afebrile and has no clinical signs of acute infection. She was evaluated by Dr Terri Piedra of dermatology July 2013. Peripheral neuropathy  likely contributing to her pain as well. Bilateral Doppler ultrasound were negative 2 months ago. She takes furosemide 60 mg daily.  Plan  - TED hose  - pain management  - continue with Furosemide  - Continue with amitriptyline - Robaxin 500 mg twice daily   #Diabetes Type 2: Controlled with HbA1c on 03/05/2013 was 7.1%. She  takes metformin 500 mg 3 times a day. CBG on admission 163.  Plan  - hold metformin  - carb-modified diet - monitor CBGs ac/hs  - SSI   #Hypothyrodism: Last TSH on 03/05/2013 was 2.145. Will continue with home Synthroid 75 mcg daily  #Hypertension: Blood pressure is currently stable. Will continue with home enalapril 5 mg daily and monitor BP   #Hypercholesterolemia: 03/06/1999 410 Total cholesterol 115, HDL 37, LDL 44. Will continue with home Crestor.  #Iron Deficiency Anemia: BL Hgb 10-11. Today 10.9. Normal RBC indices. Low iron level of 19 in 16109, Normal Colonoscopy 2011. This will be evaluated as outpatient.   Dispo: Disposition is deferred at this time, awaiting improvement of current medical problems. Anticipated discharge in approximately 2-3 day(s).   The patient does have a current PCP Farley Ly, MD), therefore will be require OPC follow-up after discharge.   The patient does not have transportation limitations that hinder transportation to clinic appointments.   Signed:  Dow Adolph, MD PGY-1 Internal Medicine Teaching Service Pager: [phone] (7pm-7am) 05/01/2013, 10:59 PM

## 2013-05-01 NOTE — ED Notes (Signed)
Respiratory paged

## 2013-05-02 ENCOUNTER — Encounter (HOSPITAL_COMMUNITY): Payer: Self-pay | Admitting: General Practice

## 2013-05-02 LAB — GLUCOSE, CAPILLARY
Glucose-Capillary: 237 mg/dL — ABNORMAL HIGH (ref 70–99)
Glucose-Capillary: 262 mg/dL — ABNORMAL HIGH (ref 70–99)

## 2013-05-02 LAB — BASIC METABOLIC PANEL
CO2: 24 mEq/L (ref 19–32)
Calcium: 8.9 mg/dL (ref 8.4–10.5)
Creatinine, Ser: 0.51 mg/dL (ref 0.50–1.10)
GFR calc Af Amer: >90 mL/min (ref >90)
GFR calc non Af Amer: >90 mL/min (ref >90)
Sodium: 138 mEq/L (ref 135–145)

## 2013-05-02 LAB — TROPONIN I
Troponin I: <0.3 ng/mL (ref <0.30)
Troponin I: <0.3 ng/mL (ref <0.30)

## 2013-05-02 MED ORDER — HYDROXYZINE HCL 10 MG PO TABS
10.0000 mg | ORAL_TABLET | Freq: Three times a day (TID) | ORAL | Status: DC | PRN
  Filled 2013-05-02: qty 1

## 2013-05-02 MED ORDER — IPRATROPIUM BROMIDE 0.02 % IN SOLN
0.5000 mg | Freq: Three times a day (TID) | RESPIRATORY_TRACT | Status: DC
  Administered 2013-05-02 – 2013-05-03 (×4): 0.5 mg via RESPIRATORY_TRACT
  Filled 2013-05-02 (×4): qty 2.5

## 2013-05-02 MED ORDER — HYDROMORPHONE HCL 2 MG PO TABS
2.0000 mg | ORAL_TABLET | Freq: Four times a day (QID) | ORAL | Status: DC | PRN
  Administered 2013-05-02 (×2): 2 mg via ORAL
  Filled 2013-05-02 (×2): qty 1

## 2013-05-02 MED ORDER — ALBUTEROL SULFATE (5 MG/ML) 0.5% IN NEBU
2.5000 mg | INHALATION_SOLUTION | Freq: Three times a day (TID) | RESPIRATORY_TRACT | Status: DC
  Administered 2013-05-02 – 2013-05-03 (×4): 2.5 mg via RESPIRATORY_TRACT
  Filled 2013-05-02 (×3): qty 0.5

## 2013-05-02 MED ORDER — ZOLPIDEM TARTRATE 5 MG PO TABS: 5.0000 mg | ORAL_TABLET | Freq: Every evening | ORAL | Status: DC | PRN

## 2013-05-02 MED ORDER — ZOLPIDEM TARTRATE 5 MG PO TABS
10.0000 mg | ORAL_TABLET | Freq: Every evening | ORAL | Status: DC | PRN
  Administered 2013-05-02: 10 mg via ORAL
  Filled 2013-05-02: qty 2

## 2013-05-02 MED ORDER — ALBUTEROL SULFATE (5 MG/ML) 0.5% IN NEBU
INHALATION_SOLUTION | RESPIRATORY_TRACT | Status: AC
  Filled 2013-05-02: qty 0.5

## 2013-05-02 MED ORDER — HYDROMORPHONE HCL 2 MG PO TABS
2.0000 mg | ORAL_TABLET | Freq: Three times a day (TID) | ORAL | Status: DC | PRN
  Administered 2013-05-02 – 2013-05-03 (×2): 2 mg via ORAL
  Filled 2013-05-02 (×2): qty 1

## 2013-05-02 MED ORDER — HYDROMORPHONE HCL 2 MG PO TABS: 2.0000 mg | ORAL_TABLET | Freq: Three times a day (TID) | ORAL | Status: DC | PRN

## 2013-05-02 NOTE — Progress Notes (Signed)
Utilization Review Completed.   Leannah Guse, RN, BSN Nurse Case Manager  [phone]  

## 2013-05-02 NOTE — Progress Notes (Signed)
Subjective:    Patient feeling better today. SOB improved after receiving a nebulizer treatment. Denies CP. Denies any other complaints.   Interval Events: No acute events.    Objective:    Vital Signs:   Temp:  [98.1 F (36.7 C)-98.8 F (37.1 C)] 98.1 F (36.7 C) (06/13 0619) Pulse Rate:  [100-115] 100 (06/13 0619) Resp:  [15-30] 20 (06/13 0619) BP: (97-131)/(39-88) 124/66 mmHg (06/13 0951) SpO2:  [94 %-100 %] 97 % (06/13 0841) Weight:  [222 lb 14.4 oz (101.107 kg)] 222 lb 14.4 oz (101.107 kg) (06/12 2313) Last BM Date: 04/30/13  24-hour weight change: Weight change:   Intake/Output:   Intake/Output Summary (Last 24 hours) at 05/02/13 1021 Last data filed at 05/02/13 0900  Gross per 24 hour  Intake    363 ml  Output    500 ml  Net   -137 ml      Physical Exam: General: Vital signs reviewed and noted. Well-developed, well-nourished, in no acute distress; alert, appropriate and cooperative throughout examination.  Lungs:  Normal respiratory effort. Clear to auscultation BL without crackles or wheezes.  Heart: RRR. S1 and S2 normal without gallop, murmur, or rubs.  Abdomen:  BS normoactive. Soft, Nondistended, non-tender.  No masses or organomegaly.  Extremities: Trace pretibial pitting edema bilaterally. Diffuse TTP of BLE below the knees.     Labs:  Basic Metabolic Panel:  Recent Labs Lab 05/01/13 1715 05/02/13 0430  NA 139 138  K 3.5 3.8  CL 96 97  CO2 29 24  GLUCOSE 163* 292*  BUN 6 8  CREATININE 0.49* 0.51  CALCIUM 9.1 8.9    Liver Function Tests:  Recent Labs Lab 05/01/13 1715  AST 31  ALT 18  ALKPHOS 92  BILITOT 0.2*  PROT 7.0  ALBUMIN 3.8   CBC:  Recent Labs Lab 05/01/13 1715  WBC 7.9  NEUTROABS 5.5  HGB 10.9*  HCT 32.6*  MCV 84.2  PLT 228    Cardiac Enzymes:  Recent Labs Lab 05/01/13 2318 05/02/13 0430  TROPONINI <0.30 <0.30    CBG:  Recent Labs Lab 05/01/13 2309 05/02/13 0622  GLUCAP 307* 237*     Imaging: Dg Chest 2 View (if Patient Has Fever And/or Copd)  05/01/2013   *RADIOLOGY REPORT*  Clinical Data: Shortness of breath  CHEST - 2 VIEW  Comparison: 03/05/2013  Findings: The heart size and mediastinal contours are within normal limits.  Both lungs are clear. Spondylosis noted within the thoracic spine.  IMPRESSION: Negative exam.   Original Report Authenticated By: Signa Kell, M.D.       Medications:    Infusions:    Scheduled Medications: . albuterol  2.5 mg Nebulization TID  . amitriptyline  50 mg Oral QHS  . aspirin  81 mg Oral Daily  . atorvastatin  20 mg Oral q1800  . darifenacin  15 mg Oral Daily  . doxycycline  100 mg Oral Q12H  . enalapril  5 mg Oral Daily  . enoxaparin (LOVENOX) injection  40 mg Subcutaneous Daily  . erythromycin  250 mg Oral BID WC  . furosemide  60 mg Oral Daily  . insulin aspart  0-15 Units Subcutaneous TID WC  . insulin glargine  5 Units Subcutaneous QHS  . ipratropium  0.5 mg Nebulization TID  . levothyroxine  75 mcg Oral QAC breakfast  . methocarbamol  500 mg Oral BID  . pantoprazole  40 mg Oral Daily  . potassium chloride  20 mEq Oral Daily  .  predniSONE  60 mg Oral Q breakfast  . rOPINIRole  3 mg Oral QHS  . sertraline  100 mg Oral Daily  . sodium chloride  3 mL Intravenous Q12H  . sodium chloride  3 mL Intravenous Q12H    PRN Medications: sodium chloride, albuterol, ALPRAZolam, HYDROmorphone, hydrOXYzine, sodium chloride, zolpidem   Assessment/ Plan:   Pt is a 52 year old female with a PMH of T2DM, asthma, hypothyroidism, diabetic neuropathy, anxiety, degenerative disc disease, spinal stenosis, and chronic bilateral venous statis dermatitis, who presents with 3 weeks history of shortness of breath and increased bilateral LE pain.  Asthma exacerbation - pt feeling well today after bronchodilator nebulizer treatments and ~18 hours of corticosteroid treatment.  Plan  - cont duonebs q6h - cont prednisone - cont  doxycycline - supplemental O2 (to maintain O2 sats 88-92%) - monitor PEFR  Bilateral Venous Statis Dermatitis - pt's complaints regarding this issue are minimal and  - TED hose  - pain management  - cont lasix - cont elavil - cont robaxin  Diabetes Type 2 - A1c = 7/1 in 02/2013. She takes metformin 500 mg 3 times a day at home. CBGs increased to high 200s after admission, likely 2/2 corticosteroids. - hold metformin  - carb-modified diet  - monitor CBGs ac/hs  - SSI    Hypothyrodism - Last TSH on 03/05/2013 was 2.145. Will continue with home Synthroid 75 mcg daily.  Hypertension - Blood pressure is currently stable. Will continue with home enalapril 5 mg daily and monitor BP.  HLD -  Cont crestor. .  Iron Deficiency Anemia - Hb = 10.9 on admission (baseline).   Length of Stay: 1 day(s)   Signed: Elfredia Nevins, MD  PGY-1, Internal Medicine Resident Pager: [phone] (7AM-5PM) 05/02/2013, 10:21 AM

## 2013-05-02 NOTE — H&P (Signed)
Internal Medicine Attending Admission Note Date: 05/02/2013  Patient name: Mary Gray Medical record number: [id number] Date of birth: [birth year] Age: 52 y.o. Gender: female  I saw and evaluated the patient. I reviewed the resident's note and I agree with the resident's findings and plan as documented in the resident's note, with the following additional comments.  Chief Complaint(s): Shortness of breath, wheezing  History - key components related to admission: Patient is a 52 year old woman with history of asthma, type 2 diabetes mellitus, spinal stenosis with chronic back pain, and other problems as outlined in the medical history admitted with complaint of worsening shortness of breath that did not improve with outpatient treatment.  Patient reports that she has been using her inhalers regularly.  Physical Exam - key components related to admission:  Filed Vitals:   05/02/13 0144 05/02/13 0619 05/02/13 0841 05/02/13 0951  BP:  123/69  124/66  Pulse: 101 100    Temp:  98.1 F (36.7 C)    TempSrc:  Oral    Resp: 24 20    Height:      Weight:      SpO2: 96% 96% 97%     General: Alert, no acute distress Lungs: Clear at the time of my exam Heart: Regular; no extra sounds or murmurs Abdomen: Bowel sounds present, soft, nontender Extremities: Trace bilateral leg edema  Lab results:   Basic Metabolic Panel:  Recent Labs  74/25/95 1715 05/02/13 0430  NA 139 138  K 3.5 3.8  CL 96 97  CO2 29 24  GLUCOSE 163* 292*  BUN 6 8  CREATININE 0.49* 0.51  CALCIUM 9.1 8.9   Liver Function Tests:  Recent Labs  05/01/13 1715  AST 31  ALT 18  ALKPHOS 92  BILITOT 0.2*  PROT 7.0  ALBUMIN 3.8    CBC:  Recent Labs  05/01/13 1715  WBC 7.9  NEUTROABS 5.5  HGB 10.9*  HCT 32.6*  MCV 84.2  PLT 228    Cardiac Enzymes:  Recent Labs  05/01/13 2318 05/02/13 0430  TROPONINI <0.30 <0.30    BNP    Component Value Date/Time   PROBNP 38.5 05/01/2013 2318     CBG:  Recent Labs  05/01/13 2309 05/02/13 0622  GLUCAP 307* 237*   Urinalysis    Component Value Date/Time   COLORURINE YELLOW 05/01/2013 1915   APPEARANCEUR CLEAR 05/01/2013 1915   LABSPEC 1.009 05/01/2013 1915   PHURINE 6.0 05/01/2013 1915   GLUCOSEU 100* 05/01/2013 1915   HGBUR NEGATIVE 05/01/2013 1915   BILIRUBINUR NEGATIVE 05/01/2013 1915   KETONESUR NEGATIVE 05/01/2013 1915   PROTEINUR NEGATIVE 05/01/2013 1915   UROBILINOGEN 0.2 05/01/2013 1915   NITRITE NEGATIVE 05/01/2013 1915   LEUKOCYTESUR NEGATIVE 05/01/2013 1915    Imaging results:  Dg Chest 2 View (if Patient Has Fever And/or Copd)  05/01/2013   *RADIOLOGY REPORT*  Clinical Data: Shortness of breath  CHEST - 2 VIEW  Comparison: 03/05/2013  Findings: The heart size and mediastinal contours are within normal limits.  Both lungs are clear. Spondylosis noted within the thoracic spine.  IMPRESSION: Negative exam.   Original Report Authenticated By: Signa Kell, M.D.    Other results: EKG: sinus tachycardia; borderline prolonged QT interval  Assessment & Plan by Problem:  1.  Asthma exacerbation.  Patient presented with wheezing and shortness of breath consistent with an acute exacerbation of asthma.  She has improved somewhat following initial bronchodilator treatment, but does not feel that her breathing is at baseline.  Plan is steroids; inhaled bronchodilators; empiric antibiotic given question of COPD; supplement oxygen and follow saturations; mobilize out of bed with assistance.  2.  Back pain.  Patient has spinal stenosis with chronic pain; she reports that this has been reasonably controlled on her home oral regimen.  Plan is continue home pain regimen.  3.  Other problems and plans as per the resident physician's note.  4.  Attending physician: Dr. Rogelia Boga will cover as the on-call attending physician this weekend, and Dr. Dalphine Handing will take over as attending physician on Monday 05/05/2013.

## 2013-05-02 NOTE — Progress Notes (Signed)
Brief Nutrition Note:  RD pulled to chart for malnutrition screening tool report of unintentional weight loss and poor appetite PTA.   Pt states she has been losing some weight, but she "needed to". Appetite is less, but continues to eat several small meals daily.  Wt Readings from Last 5 Encounters:  05/01/13 222 lb 14.4 oz (101.107 kg)  04/22/13 225 lb 4.8 oz (102.195 kg)  04/10/13 228 lb (103.42 kg)  04/01/13 233 lb 1.6 oz (105.733 kg)  03/19/13 231 lb 14.4 oz (105.189 kg)  Weight loss is not significant in given time frame.   Spoke with pt, appetite improving this admission. Pt denies any nutrition needs or concerns at this time. Chart reviewed, no nutrition interventions warranted at this time. Please consult as needed.    Clarene Duke RD, LDN Pager [phone] After Hours pager [phone]

## 2013-05-02 NOTE — Progress Notes (Signed)
Patient evaluated for long-term disease management services with [facility] Care Management Program. Explained [facility] Care Management services to Ms Gates. She reports she lives with family at home. Consents signed at bedside. Patient will receive a post discharge transition of care call and will be evaluated for monthly home visits for assessments and for education. [facility] Care Management packet left with patient and contact information confirmed.   Raiford Noble, MSN-Ed, RN,BSN, [facility], [phone]

## 2013-05-03 ENCOUNTER — Encounter: Payer: Self-pay | Admitting: Radiation Oncology

## 2013-05-03 DIAGNOSIS — J45901 Unspecified asthma with (acute) exacerbation: Secondary | ICD-10-CM | POA: Insufficient documentation

## 2013-05-03 LAB — GLUCOSE, CAPILLARY: Glucose-Capillary: 164 mg/dL — ABNORMAL HIGH (ref 70–99)

## 2013-05-03 MED ORDER — DOXYCYCLINE HYCLATE 100 MG PO TABS: 100.0000 mg | ORAL_TABLET | Freq: Two times a day (BID) | ORAL | Status: DC

## 2013-05-03 MED ORDER — ALBUTEROL SULFATE (2.5 MG/3ML) 0.083% IN NEBU: 2.5000 mg | INHALATION_SOLUTION | Freq: Four times a day (QID) | RESPIRATORY_TRACT | Status: DC | PRN

## 2013-05-03 MED ORDER — PREDNISONE 20 MG PO TABS: 60.0000 mg | ORAL_TABLET | Freq: Every day | ORAL | Status: DC

## 2013-05-03 MED ORDER — ALBUTEROL SULFATE HFA 108 (90 BASE) MCG/ACT IN AERS: 2.0000 | INHALATION_SPRAY | RESPIRATORY_TRACT | Status: DC | PRN

## 2013-05-03 MED ORDER — FLUTICASONE PROPIONATE (INHAL) 50 MCG/BLIST IN AEPB: 1.0000 | INHALATION_SPRAY | Freq: Two times a day (BID) | RESPIRATORY_TRACT | Status: DC

## 2013-05-03 NOTE — Progress Notes (Signed)
Pt resting in bed asleep, sister at bedside.

## 2013-05-03 NOTE — Progress Notes (Signed)
Pt d/c'd to home with family. D/c instruction d/w pt. Pt verbalizes understanding. Lungs clear, no resp distress at time of d/c

## 2013-05-03 NOTE — Progress Notes (Signed)
Pt's having anxiety attack after talking to her mother over the phone, xanax 1 mg given.

## 2013-05-03 NOTE — Discharge Summary (Signed)
Name: Mary Gray MRN: [id number] DOB: 02/28/1961 52 y.o. PCP: Farley Ly, MD  Date of Admission: 05/01/2013  3:58 PM Date of Discharge: 05/03/2013 Attending Physician: Dr. Rogelia Boga  Discharge Diagnosis: Asthma exacerbation Polypharmacy Bilateral venous stasis dermatitis Type 2 Diabetes mellitus Hypertension Hypothyroidism HLD Iron deficiency anemia  Discharge Medications:   Medication List    STOP taking these medications       hydrOXYzine 10 MG tablet  Commonly known as:  ATARAX/VISTARIL     methocarbamol 500 MG tablet  Commonly known as:  ROBAXIN      TAKE these medications       acetic acid-hydrocortisone otic solution  Commonly known as:  VOSOL-HC  Place 4 drops into both ears 2 (two) times daily. For 3-5 days     albuterol 108 (90 BASE) MCG/ACT inhaler  Commonly known as:  PROAIR HFA  Inhale 1-2 puffs into the lungs every 6 (six) hours as needed for shortness of breath.     albuterol 108 (90 BASE) MCG/ACT inhaler  Commonly known as:  PROVENTIL HFA;VENTOLIN HFA  Inhale 2 puffs into the lungs every 4 (four) hours as needed for wheezing or shortness of breath.     albuterol (2.5 MG/3ML) 0.083% nebulizer solution  Commonly known as:  PROVENTIL  Take 3 mLs (2.5 mg total) by nebulization every 6 (six) hours as needed for wheezing.     ALPRAZolam 1 MG tablet  Commonly known as:  XANAX  Take 1 mg by mouth 4 (four) times daily as needed. For anxiety     amitriptyline 25 MG tablet  Commonly known as:  ELAVIL  Take 2 tablets (50 mg total) by mouth at bedtime.     aspirin 81 MG chewable tablet  Chew 81 mg by mouth daily.     carisoprodol 350 MG tablet  Commonly known as:  SOMA  TAKE 1 TABLET BY MOUTH 4 TIMES A DAY AS NEEDED FOR MUSCLE SPASMS     doxycycline 100 MG tablet  Commonly known as:  VIBRA-TABS  Take 1 tablet (100 mg total) by mouth every 12 (twelve) hours.     enalapril 5 MG tablet  Commonly known as:  VASOTEC  TAKE 1 TABLET EVERY DAY     erythromycin ethylsuccinate 400 MG tablet  Commonly known as:  EES  Take 400 mg by mouth 2 (two) times daily. Maintenance for Rosacea     esomeprazole 40 MG capsule  Commonly known as:  NEXIUM  Take 1 capsule (40 mg total) by mouth daily before breakfast.     fluticasone 50 MCG/ACT nasal spray  Commonly known as:  FLONASE  Place 2 sprays into the nose daily as needed. For allergies     fluticasone 50 MCG/BLIST diskus inhaler  Commonly known as:  FLOVENT DISKUS  Inhale 1 puff into the lungs 2 (two) times daily.     furosemide 20 MG tablet  Commonly known as:  LASIX  Take 40-60 mg by mouth 2 (two) times daily. 60 mg in the a.m. And 40 mg in the afternoon.     guaiFENesin 600 MG 12 hr tablet  Commonly known as:  MUCINEX  Take 2 tablets (1,200 mg total) by mouth 2 (two) times daily.     HYDROmorphone 2 MG tablet  Commonly known as:  DILAUDID  Take 1 tablet (2 mg total) by mouth every 8 (eight) hours as needed for pain.     ipratropium 17 MCG/ACT inhaler  Commonly known as:  ATROVENT HFA  Inhale 2 puffs into the lungs 4 (four) times daily as needed. For wheezing     KRISTALOSE 20 G packet  Generic drug:  lactulose  Take 20 g by mouth 2 (two) times daily as needed (constipation).     metFORMIN 500 MG 24 hr tablet  Commonly known as:  GLUCOPHAGE-XR  TAKE 3 TABLETS BY MOUTH DAILY     potassium chloride 10 MEQ tablet  Commonly known as:  K-DUR  Take 20 mEq by mouth 2 (two) times daily.     predniSONE 20 MG tablet  Commonly known as:  DELTASONE  Take 3 tablets (60 mg total) by mouth daily with breakfast.     rOPINIRole 3 MG tablet  Commonly known as:  REQUIP  Take 3 mg by mouth at bedtime.     rosuvastatin 10 MG tablet  Commonly known as:  CRESTOR  Take 10 mg by mouth daily.     sertraline 100 MG tablet  Commonly known as:  ZOLOFT  Take 100 mg by mouth daily.     solifenacin 10 MG tablet  Commonly known as:  VESICARE  Take 10 mg by mouth daily.     SYNTHROID  75 MCG tablet  Generic drug:  levothyroxine  TAKE 1 TABLET BY MOUTH DAILY     zolpidem 10 MG tablet  Commonly known as:  AMBIEN  Take 20 mg by mouth at bedtime.        Disposition and follow-up:   Mary Gray was discharged from [facility] in Good condition.  At the hospital follow up visit please address:  1. F/u on complaints of SOB and cough and assess for resolution. Assess for compliance with prednisone and doxycycline as prescribed at discharge.   2. Consider further decreasing the number and quantities of patient's sedating medications. Coordinate management of this issue with the patient's psychiatrist.   3. Labs / imaging needed at time of follow-up: N/A  4. Pending labs/ test needing follow-up: N/A  Follow-up Appointments:   Discharge Instructions:     Discharge Orders   Future Appointments Provider Department Dept Phone   05/14/2013 8:45 AM Farley Ly, MD [facility] [phone]   Future Orders Complete By Expires     Diet - low sodium heart healthy  As directed     Increase activity slowly  As directed        Procedures Performed:  Dg Chest 2 View 05/01/2013   *RADIOLOGY REPORT*  Clinical Data: Shortness of breath  CHEST - 2 VIEW  Comparison: 03/05/2013  Findings: The heart size and mediastinal contours are within normal limits.  Both lungs are clear. Spondylosis noted within the thoracic spine.  IMPRESSION: Negative exam.   Original Report Authenticated By: Signa Kell, M.D.   [employer] R  04/22/2013   *RADIOLOGY REPORT*  Clinical Data:  Called back from screening mammogram for calcifications right breast  DIGITAL DIAGNOSTIC RIGHT MAMMOGRAM April 22, 2013  Comparison: March 03, 2013, November 02, 2010  Findings:  ACR Breast Density Category scattered fibroglandular tissue  Spot magnification CC and lateral view of the right breast are submitted.  Previously questioned calcifications are coarse benign-  appearing.  IMPRESSION: Benign findings.  RECOMMENDATION: Routine screening mammogram back on schedule.  I have discussed the findings and recommendations with the patient. Results were also provided in writing at the conclusion of the visit.  If applicable, a reminder letter will be sent to the patient regarding her  next appointment.  BI-RADS CATEGORY 2:  Benign finding(s).   Original Report Authenticated By: Sherian Rein, M.D.    Admission HPI: Pt has SOB and cough at baseline which has been attributed to COPD, and asthma. However, 3 weeks ago her symptoms have increased. She also reports postnasal drainage, nasal congestion and mucus drainage. She admits to wheezing and green sputum production which has not increased in amount. No hemoptysis. She also reports bilateral chest pain with cough. She admits to a sore throat, nausea but no vomiting or abdominal pain. Other symptoms include fatigue, reduced oral intake, subjective fevers and chills. She has a history of contact with her mother, who had similar symptoms prior. She was evaluated in outpatient clinic on 04/22/2013 for a viral URI and given Mucinex without improvement. She has increased the frequence of her albuterol inhaler from twice a day to 4 times without improvement.   She described increased bilateral lower extremity pain, which she rates 8/10, constant, burning and dull, increased with touch and movement/walking. The pain improves with rest . She also reports increased red coloration of both her legs. No history of increased weakness, but she describes numbness. She has a documented history of venous statis dermatis (by Dr Terri Piedra of dermatology) due to ? chronic venous insufficiency. Lower extremity Dopplers on 03/05/2013 where negative for DVT. Cellulitis has been felt unlikely from previous and multiple clinic evaluations. She was advised to use pressure stockings which she has tried without relief. She has also been trying warm compressions.     Hospital Course by problem list:  Asthma exacerbation - patient presented with complaints of shortness of breath and cough productive of white sputum. Wheezes were noted on auscultation at time of admission. The patient received scheduled duonebs and was started on prednisone and doxycycline (antibiotics were given as it was deemed possible pt may have baseline COPD rather than completely reversible asthma). She began improving quickly after admission. Although her presumed asthma exacerbation was deemed mild, she was discharged with instructions to complete a five-day total course of prednisone and doxycycline. At time of followup, PFTs should be considered to exclude a diagnosis of COPD, however the patient's smoking history per her report is <15 pack years which would make this diagnosis less likely.   Polypharmacy - the patient is on a concerning number of sedating medications as an outpatient, particularly given her history of asthma (possible COPD) and current asthma exacerbation which places her at high risk for developing respiratory depression. She appeared very somnolent and lethargic throughout her hospital course on this regimen. She is seen in the internal medicine clinic however she also has a psychiatrist she sees regularly who prescribes her Ambien and Xanax in addition to the regimen she is provided in the [facility]. Below is a comprehensive list of the names and amounts of habit-forming and sedating medications she takes daily (a review of her filled prescriptions confirms that she fills all of these medications regularly):  Drug Total daily dose  Dilaudid  8mg    Xanax 4mg   Ambien 20mg   Hydroxyzine 30mg   Robaxin 1000mg   Soma 1400mg   Requip 3mg   Elavil 100mg     As she was noted to be on both Robaxin and Soma on admission, Robaxin was discontinued prior to discharge. Hydroxyzine is also highly sedating and habit forming, and was also discontinued at discharge. At followup, further  decreasing the number of sedating medications the patient is taking regularly should be considered in order to reduce the risk of precipitating  respiratory depression/arrest in this patient.  Bilateral venous stasis dermatitis - Patient has mild chronic venous insufficiency and states she has been using her compression stockings as prescribed. Her lower extremity edema and erythema appeared improved as compared to recent clinic visits. She was counseled that she should keep her legs elevated and use the compression stockings regularly (of note she did not appear to be copmpliant with either of these recommendations during her hospital stay).   Type 2 Diabetes mellitus - CBGs stable throughout course, with peak values of ~300 likely secondary to initiation of corticosteroids. She received SSI during her course, and metformin was resumed at discharge.   Hypertension - Stable on home enalapril.   Hypothyroidism - TSH = 2.145. Synthroid continued at discharge.   HLD - LDL = 44 in 02/2013. Crestor continued at discharge.   Iron deficiency anemia - Hb = 10.9 on admission (~ baseline).     Discharge Vitals:   BP 148/82  Pulse 80  Temp(Src) 97.2 F (36.2 C) (Oral)  Resp 20  Ht 5\' 1"  (1.549 m)  Wt 223 lb 3.2 oz (101.243 kg)  BMI 42.2 kg/m2  SpO2 98%  LMP 01/17/2009  Discharge Labs:  Results for orders placed during the hospital encounter of 05/01/13 (from the past 24 hour(s))  GLUCOSE, CAPILLARY     Status: Abnormal   Collection Time    05/02/13  1:07 PM      Result Value Range   Glucose-Capillary 262 (*) 70 - 99 mg/dL  GLUCOSE, CAPILLARY     Status: Abnormal   Collection Time    05/02/13  5:53 PM      Result Value Range   Glucose-Capillary 294 (*) 70 - 99 mg/dL  GLUCOSE, CAPILLARY     Status: Abnormal   Collection Time    05/02/13  9:39 PM      Result Value Range   Glucose-Capillary 305 (*) 70 - 99 mg/dL  GLUCOSE, CAPILLARY     Status: Abnormal   Collection Time    05/03/13  6:25  AM      Result Value Range   Glucose-Capillary 164 (*) 70 - 99 mg/dL    Signed: Elfredia Nevins, MD 05/03/2013, 11:55 AM   Time Spent on Discharge: 35 minutes Services Ordered on Discharge: N/A Equipment Ordered on Discharge: N/A

## 2013-05-07 ENCOUNTER — Other Ambulatory Visit: Payer: Self-pay | Admitting: *Deleted

## 2013-05-08 ENCOUNTER — Telehealth: Payer: Self-pay | Admitting: *Deleted

## 2013-05-08 NOTE — Telephone Encounter (Signed)
Returned pt's call about Prednisone - which was refilled 6/14 by Dr Lavena Bullion; no answer, message left.

## 2013-05-08 NOTE — Telephone Encounter (Signed)
Left message on home phone ID recording - both meds were denied. To call [facility] if no inprovement for appt.

## 2013-05-14 ENCOUNTER — Ambulatory Visit (INDEPENDENT_AMBULATORY_CARE_PROVIDER_SITE_OTHER): Payer: Medicare HMO | Admitting: Internal Medicine

## 2013-05-14 ENCOUNTER — Encounter: Payer: Self-pay | Admitting: Internal Medicine

## 2013-05-14 ENCOUNTER — Other Ambulatory Visit: Payer: Self-pay | Admitting: Internal Medicine

## 2013-05-14 ENCOUNTER — Other Ambulatory Visit (HOSPITAL_COMMUNITY): Payer: Self-pay | Admitting: Internal Medicine

## 2013-05-14 VITALS — BP 120/68 | HR 93 | Temp 97.5°F | Ht 61.0 in | Wt 224.6 lb

## 2013-05-14 DIAGNOSIS — J45909 Unspecified asthma, uncomplicated: Secondary | ICD-10-CM

## 2013-05-14 DIAGNOSIS — L84 Corns and callosities: Secondary | ICD-10-CM | POA: Insufficient documentation

## 2013-05-14 DIAGNOSIS — Z0000 Encounter for general adult medical examination without abnormal findings: Secondary | ICD-10-CM

## 2013-05-14 DIAGNOSIS — H60399 Other infective otitis externa, unspecified ear: Secondary | ICD-10-CM

## 2013-05-14 DIAGNOSIS — H6092 Unspecified otitis externa, left ear: Secondary | ICD-10-CM

## 2013-05-14 DIAGNOSIS — I10 Essential (primary) hypertension: Secondary | ICD-10-CM

## 2013-05-14 DIAGNOSIS — H6090 Unspecified otitis externa, unspecified ear: Secondary | ICD-10-CM | POA: Insufficient documentation

## 2013-05-14 DIAGNOSIS — K121 Other forms of stomatitis: Secondary | ICD-10-CM

## 2013-05-14 DIAGNOSIS — K1370 Unspecified lesions of oral mucosa: Secondary | ICD-10-CM

## 2013-05-14 DIAGNOSIS — E7800 Pure hypercholesterolemia, unspecified: Secondary | ICD-10-CM

## 2013-05-14 DIAGNOSIS — E119 Type 2 diabetes mellitus without complications: Secondary | ICD-10-CM

## 2013-05-14 DIAGNOSIS — F411 Generalized anxiety disorder: Secondary | ICD-10-CM

## 2013-05-14 MED ORDER — NYSTATIN 100000 UNIT/GM EX POWD: CUTANEOUS | Status: DC

## 2013-05-14 MED ORDER — ALBUTEROL SULFATE HFA 108 (90 BASE) MCG/ACT IN AERS: 2.0000 | INHALATION_SPRAY | Freq: Four times a day (QID) | RESPIRATORY_TRACT | Status: DC | PRN

## 2013-05-14 MED ORDER — HYDROCORTISONE-ACETIC ACID 1-2 % OT SOLN: 4.0000 [drp] | Freq: Three times a day (TID) | OTIC | Status: DC

## 2013-05-14 MED ORDER — HYDROMORPHONE HCL 2 MG PO TABS: 2.0000 mg | ORAL_TABLET | Freq: Three times a day (TID) | ORAL | Status: DC | PRN

## 2013-05-14 MED ORDER — TRIAMCINOLONE ACETONIDE 0.1 % MT PSTE: PASTE | OROMUCOSAL | Status: DC

## 2013-05-14 NOTE — Assessment & Plan Note (Signed)
Assessment: Patient appears to be doing well symptomatically on her current regimen, which is managed by her psychiatrist.  Plan: Patient will follow up with her psychiatrist as planned.

## 2013-05-14 NOTE — Progress Notes (Signed)
  Subjective:    Patient ID: Mary Gray, female    DOB: [birth year], 52 y.o.   MRN: [id number]  HPI Patient returns for followup of a recent hospitalization 6/12 through 05/03/2013 for acute asthma exacerbation, and for followup of her diabetes mellitus, back pain, and other chronic medical problems.  She reports that she has been stable since her hospital discharge; she has had some persisting cough, but otherwise her symptoms improved with completion of a prednisone taper.  Her back pain is chronic and persistent, and worsened following a fall earlier this year, but appears to be controlled on her current regimen of tramadol and hydromorphone as needed.  She has not seen her neurosurgeon in followup.  Patient reports a new complaint of a callus with a small fissure on the medial aspect of her right foot, along with callus on her heel.  Patient was recently treated for external otitis with VoSol HC eardrops, and reports significant improvement in her symptoms; she does report some residual tenderness/pain of the left ear, and requested a refill of the VoSol HC eardrops.  She also reports a painful lesion on the left lateral aspect of her tongue which has been present for a few days.   Review of Systems See HPI.     Objective:   Physical Exam  Constitutional: No distress.  HENT:  Left Ear: There is swelling (Mild edema of left external ear canal with a small amount of white exudate) and tenderness (External ear is tender to manipulation). Tympanic membrane is not injected, not erythematous, not retracted and not bulging.  No middle ear effusion.  Mouth/Throat:    Cardiovascular: Normal rate, regular rhythm and normal heart sounds.  Exam reveals no gallop.   No murmur heard. Pulmonary/Chest: Effort normal and breath sounds normal. No respiratory distress. She has no wheezes. She has no rales.  Abdominal: Soft. Bowel sounds are normal. There is no tenderness.  Musculoskeletal: She exhibits no  edema.       Feet:  Skin: There is erythema (Mild chronic erythema of both legs and feet).       Assessment & Plan:

## 2013-05-14 NOTE — Assessment & Plan Note (Signed)
I spoke at length with patient about the importance of having a routine screening Pap smear, which has apparently not been done for several years.  I have advised this in the past.  Patient agreed to schedule an appointment with her gynecologist.

## 2013-05-14 NOTE — Assessment & Plan Note (Signed)
Lab Results  Component Value Date   HGBA1C 7.1 03/05/2013   HGBA1C 6.6 10/23/2012   HGBA1C 6.7 05/15/2012     Assessment: Diabetes control: fair control Progress toward A1C goal:  unchanged Comments: Patient's last hemoglobin A1c was slightly above goal on metformin XR 1500 mg daily  Plan: Medications:  continue current medications Home glucose monitoring: Frequency: once a day Timing: before breakfast Instruction/counseling given: reminded to get eye exam and discussed foot care Self management tools provided: copy of home glucose meter download

## 2013-05-14 NOTE — Assessment & Plan Note (Signed)
Lipids:    Component Value Date/Time   CHOL 115 03/05/2013 1017   TRIG 169* 03/05/2013 1017   HDL 37* 03/05/2013 1017   LDLCALC 44 03/05/2013 1017   VLDL 34 03/05/2013 1017   CHOLHDL 3.1 03/05/2013 1017    Assessment: LDL is at goal on rosuvastatin 10 mg daily; patient has no apparent side effects.  Plan: Continue rosuvastatin 10 mg daily.

## 2013-05-14 NOTE — Assessment & Plan Note (Signed)
Assessment: Patient has a small ulcer on the ventrolateral aspect of her left tongue, which has the appearance of an aphthous ulcer.  She reports that she has had this problem at least once in the past.  Plan: Plan is to treat with Orabase mouth paste with triamcinolone.  I advised patient to let me know if this does not clear up.

## 2013-05-14 NOTE — Patient Instructions (Signed)
General Instructions: 1.  Stop the Flovent inhaler. 2.  Important: Please schedule an appointment with your GYN physician for a screening Pap smear.   Treatment Goals:  Goals (1 Years of Data) as of 05/14/13         As of Today 05/03/13 05/03/13 05/02/13 05/02/13     Blood Pressure    . Blood Pressure < 140/80  120/68 148/82 128/73 137/64 130/91     Result Component    . HEMOGLOBIN A1C < 7.0          . LDL CALC < 100            Progress Toward Treatment Goals:  Treatment Goal 05/14/2013  Hemoglobin A1C unchanged  Blood pressure at goal  Prevent falls unchanged    Self Care Goals & Plans:  Self Care Goal 05/14/2013  Manage my medications take my medicines as prescribed; bring my medications to every visit; refill my medications on time  Monitor my health keep track of my blood glucose; bring my glucose meter and log to each visit  Eat healthy foods drink diet soda or water instead of juice or soda; eat more vegetables; eat foods that are low in salt; eat baked foods instead of fried foods  Be physically active find an activity I enjoy  Prevent falls use home fall prevention checklist to improve safety    Home Blood Glucose Monitoring 05/14/2013  Check my blood sugar once a day  When to check my blood sugar before breakfast     Care Management & Community Referrals:  Referral 05/14/2013  Referrals made for care management support none needed  Referrals made to community resources none

## 2013-05-14 NOTE — Assessment & Plan Note (Signed)
Assessment: Patient has an area of callus with a small fissure on the medial aspect of her right foot; there is also some callus on the plantar surface of the right heel.  There is no sign of infection or inflammation.  Plan: Patient is followed by a podiatrist, and I advised her to see her podiatrist within one week.  I also advised her to wear appropriate shoes to better protect her feet (today she is wearing flip-flop-type sandals).

## 2013-05-14 NOTE — Assessment & Plan Note (Signed)
Assessment: Patient was recently treated for external otitis with VoSol HC with significant improvement; however, she has some residual symptoms in her left ear and her examination is consistent with mild left external otitis.  Plan: I refilled the VoSol HC and advised patient to let me know if her symptoms do not resolve completely.

## 2013-05-14 NOTE — Assessment & Plan Note (Signed)
BP Readings from Last 3 Encounters:  05/14/13 120/68  05/03/13 148/82  04/22/13 104/64    Lab Results  Component Value Date   NA 138 05/02/2013   K 3.8 05/02/2013   CREATININE 0.51 05/02/2013    Assessment: Blood pressure control: controlled Progress toward BP goal:  at goal Comments: Blood pressure is controlled on enalapril 5 mg daily, and furosemide 60 mg each morning and 40 mg each evening.  Plan: Medications:  continue current medications

## 2013-05-14 NOTE — Assessment & Plan Note (Signed)
Assessment: Patient has done well since hospital discharge, with some persisting cough; her lung exam is clear.  She does not fully understand her inhaled bronchodilator regimen.  She has been using both a Symbicort inhaler and a Flovent inhaler; she is out of her albuterol inhaler and has not yet had it refilled; she also reports that she uses an ipratropium inhaler which she did nor bring to clinic  Plan: I advised patient to continue Symbicort and stop the Flovent inhaler; I advised her to continue the ipratropium inhaler; I advised her to have the albuterol inhaler refilled and use it for acute symptoms as directed.

## 2013-05-29 ENCOUNTER — Other Ambulatory Visit: Payer: Self-pay | Admitting: Internal Medicine

## 2013-06-13 ENCOUNTER — Other Ambulatory Visit: Payer: Self-pay | Admitting: *Deleted

## 2013-06-13 MED ORDER — HYDROMORPHONE HCL 2 MG PO TABS: 2.0000 mg | ORAL_TABLET | Freq: Three times a day (TID) | ORAL | Status: DC | PRN

## 2013-06-13 NOTE — Telephone Encounter (Signed)
Pt aware Rx is ready. 

## 2013-06-13 NOTE — Telephone Encounter (Signed)
Refill printed and signed - nurse to complete. 

## 2013-06-25 ENCOUNTER — Encounter: Payer: Self-pay | Admitting: Internal Medicine

## 2013-06-25 ENCOUNTER — Ambulatory Visit (INDEPENDENT_AMBULATORY_CARE_PROVIDER_SITE_OTHER): Payer: Medicare HMO | Admitting: Internal Medicine

## 2013-06-25 VITALS — BP 114/72 | HR 81 | Temp 97.9°F | Wt 225.2 lb

## 2013-06-25 DIAGNOSIS — E7800 Pure hypercholesterolemia, unspecified: Secondary | ICD-10-CM

## 2013-06-25 DIAGNOSIS — F411 Generalized anxiety disorder: Secondary | ICD-10-CM

## 2013-06-25 DIAGNOSIS — L84 Corns and callosities: Secondary | ICD-10-CM

## 2013-06-25 DIAGNOSIS — D509 Iron deficiency anemia, unspecified: Secondary | ICD-10-CM

## 2013-06-25 DIAGNOSIS — R5381 Other malaise: Secondary | ICD-10-CM

## 2013-06-25 DIAGNOSIS — J45909 Unspecified asthma, uncomplicated: Secondary | ICD-10-CM

## 2013-06-25 DIAGNOSIS — K121 Other forms of stomatitis: Secondary | ICD-10-CM

## 2013-06-25 DIAGNOSIS — E039 Hypothyroidism, unspecified: Secondary | ICD-10-CM

## 2013-06-25 DIAGNOSIS — I10 Essential (primary) hypertension: Secondary | ICD-10-CM

## 2013-06-25 DIAGNOSIS — R5383 Other fatigue: Secondary | ICD-10-CM | POA: Insufficient documentation

## 2013-06-25 DIAGNOSIS — K1370 Unspecified lesions of oral mucosa: Secondary | ICD-10-CM

## 2013-06-25 DIAGNOSIS — E119 Type 2 diabetes mellitus without complications: Secondary | ICD-10-CM

## 2013-06-25 DIAGNOSIS — M5430 Sciatica, unspecified side: Secondary | ICD-10-CM

## 2013-06-25 LAB — CBC WITH DIFFERENTIAL/PLATELET
Basophils Absolute: 0 10*3/uL (ref 0.0–0.1)
Eosinophils Absolute: 0.5 10*3/uL (ref 0.0–0.7)
Eosinophils Relative: 8 % — ABNORMAL HIGH (ref 0–5)
MCH: 27.4 pg (ref 26.0–34.0)
MCV: 84 fL (ref 78.0–100.0)
Neutrophils Relative %: 58 % (ref 43–77)
Platelets: 202 10*3/uL (ref 150–400)
RDW: 15.6 % — ABNORMAL HIGH (ref 11.5–15.5)
WBC: 5.9 10*3/uL (ref 4.0–10.5)

## 2013-06-25 LAB — COMPLETE METABOLIC PANEL WITH GFR
ALT: 18 U/L (ref 0–35)
AST: 18 U/L (ref 0–37)
Albumin: 4.1 g/dL (ref 3.5–5.2)
BUN: 11 mg/dL (ref 6–23)
CO2: 32 mEq/L (ref 19–32)
Calcium: 9.2 mg/dL (ref 8.4–10.5)
Chloride: 98 mEq/L (ref 96–112)
GFR, Est African American: >89 mL/min
Potassium: 4.3 mEq/L (ref 3.5–5.3)

## 2013-06-25 LAB — POCT GLYCOSYLATED HEMOGLOBIN (HGB A1C): Hemoglobin A1C: 6.7

## 2013-06-25 LAB — TSH: TSH: 3.129 u[IU]/mL (ref 0.350–4.500)

## 2013-06-25 MED ORDER — NYSTATIN 100000 UNIT/GM EX POWD: CUTANEOUS | Status: DC

## 2013-06-25 MED ORDER — TRIAMCINOLONE ACETONIDE 0.1 % MT PSTE: PASTE | OROMUCOSAL | Status: DC

## 2013-06-25 MED ORDER — ALPRAZOLAM 1 MG PO TABS: 1.0000 mg | ORAL_TABLET | Freq: Four times a day (QID) | ORAL | Status: DC | PRN

## 2013-06-25 MED ORDER — ZOLPIDEM TARTRATE 5 MG PO TABS: 5.0000 mg | ORAL_TABLET | Freq: Every evening | ORAL | Status: DC | PRN

## 2013-06-25 NOTE — Assessment & Plan Note (Signed)
Assessment: The cause of patient's fatigue is not clear.  She also complains of exertional dyspnea, which will be worked up with pulmonary function studies as noted below.  Plan:  Plan is check labs including a TSH and CBC.

## 2013-06-25 NOTE — Patient Instructions (Signed)
General Instructions: 1.  I have provided a one-month refill for your alprazolam (Xanax) to cover you until you see your new psychiatrist.  At that time, your psychiatrist should take over management of this and other medications for anxiety and depression; these medications were managed by your previous psychiatrist, not by your primary care physician. 2.  A referral has been made for pulmonary function testing. 3.  Please schedule an appointment with your gynecologist for a screening Pap smear; this is important. 4.  Stop Flovent. 5.  Please followup with your podiatrist.  Treatment Goals:  Goals (1 Years of Data) as of 06/25/13         As of Today 05/14/13 05/03/13 05/03/13 05/02/13     Blood Pressure    . Blood Pressure < 140/90  114/72 120/68 148/82 128/73 137/64     Result Component    . HEMOGLOBIN A1C < 7.0  6.7        . LDL CALC < 100            Progress Toward Treatment Goals:  Treatment Goal 06/25/2013  Hemoglobin A1C at goal  Blood pressure at goal  Prevent falls improved    Self Care Goals & Plans:  Self Care Goal 06/25/2013  Manage my medications take my medicines as prescribed; refill my medications on time  Monitor my health keep track of my blood glucose; bring my glucose meter and log to each visit  Eat healthy foods eat foods that are low in salt; eat baked foods instead of fried foods  Be physically active find an activity I enjoy  Prevent falls use home fall prevention checklist to improve safety    Home Blood Glucose Monitoring 06/25/2013  Check my blood sugar once a day  When to check my blood sugar before breakfast     Care Management & Community Referrals:  Referral 06/25/2013  Referrals made for care management support none needed  Referrals made to community resources none

## 2013-06-25 NOTE — Assessment & Plan Note (Signed)
Assessment: Patient has areas of callus on both feet.   Plan: I advised her to followup with her podiatrist, and that she wear appropriate shoes (she is again wearing flip-flop sandals today).

## 2013-06-25 NOTE — Assessment & Plan Note (Signed)
Assessment: Patient has not followed up with her neurosurgeon Dr. Lovell Sheehan since patient canceled her planned back surgery last fall.  She is reluctant to consider surgery at this time.  Her pain appears to be reasonably well-controlled on her current regimen of tramadol 50 mg every 6 hours as needed for pain, carisoprodol 350 mg 4 times a day as needed for muscle spasms, and hydromorphone 2 mg every 8 hours as needed for pain.  Plan: Continue current medication regimen; I advised patient to follow up with Dr. Lovell Sheehan.

## 2013-06-25 NOTE — Assessment & Plan Note (Signed)
Assessment: Patient complains of exertional dyspnea and fatigability.  She reports that she is using her inhalers as prescribed, except that she continue Flovent rather than stopping it as previously instructed.  Plan: Refer for pulmonary function studies; discontinue Flovent; continue albuterol 2 puffs every 6 hours as needed, Symbicort 160-4.5 2 puffs 2 times a day, and ipratropium 2 puffs 4 times a day as needed

## 2013-06-25 NOTE — Assessment & Plan Note (Signed)
Assessment: Patient is on Synthroid 75 mcg daily.  Given her fatigability, plan is to check a TSH.  Plan: Check a TSH today; continue Synthroid 75 mcg daily pending that result.

## 2013-06-25 NOTE — Assessment & Plan Note (Addendum)
Lab Results  Component Value Date   HGB 10.9* 05/01/2013   HGB 11.2* 03/28/2013   HGB 11.2* 03/12/2013     Assessment: Patient has fatigability, which might be related to her anemia.  Plan: Will check a CBC and ferritin level today.

## 2013-06-25 NOTE — Assessment & Plan Note (Signed)
BP Readings from Last 3 Encounters:  06/25/13 114/72  05/14/13 120/68  05/03/13 148/82    Lab Results  Component Value Date   NA 138 05/02/2013   K 3.8 05/02/2013   CREATININE 0.51 05/02/2013    Assessment: Blood pressure control: controlled Progress toward BP goal:  at goal Comments: Blood pressure is at goal on enalapril 5 mg daily and furosemide 60 mg each morning and 40 mg each evening  Plan: Medications:  continue current medications Educational resources provided: brochure;video Self management tools provided: home blood pressure logbook

## 2013-06-25 NOTE — Assessment & Plan Note (Signed)
Assessment: Patient has a small ulcer on the ventrolateral aspect of her left tongue which has improved but not yet resolved; this is likely an aphthous ulcer.    Plan: Plan is to refill Or[facility] mouth paste with triamcinolone.  I advised patient to let me know if this does not clear up.

## 2013-06-25 NOTE — Progress Notes (Signed)
  Subjective:    Patient ID: Mary Gray, female    DOB: [birth year], 52 y.o.   MRN: [id number]  HPI Patient returns for followup of her asthma, diabetes mellitus, hypertension, and other chronic medical problems.  Her main complaint today is generalized fatigability and exertional dyspnea.  She reports that she is using her inhalers regularly; although I advised her to stop her Flovent at the time of her last visit because she is also on Symbicort, she has continued the Flovent inhaler as well.  She reports that one of her sisters died last month, and this has been a stress for the entire family.  She also reports that she was dismissed from her previous psychiatrist's practice, and told to establish with a new psychiatrist; she has seen a therapist at the new practice, and reports that she has an appointment next month with the psychiatrist.  She requested a refill on her alprazolam (Xanax) today to cover her until she establishes with her new psychiatrist.  She continues to have severe back pain for which she takes tramadol and hydromorphone; she has not followed up with her back surgeon, and is reluctant to consider back surgery.  She did not make an appointment for a Pap smear following her last visit, although I advised her to do so.  She did not bring her medications to clinic today, but confirmed her med list from memory.   Review of Systems  Constitutional: Positive for fatigue. Negative for fever, chills and diaphoresis.  Respiratory: Positive for shortness of breath (Exertional). Negative for cough.   Cardiovascular: Positive for leg swelling (Mild). Negative for chest pain.  Gastrointestinal: Negative for nausea, vomiting and abdominal pain.  Musculoskeletal: Positive for back pain (Chronic).    I reviewed and updated the past medical, past surgical, family, and social history. I reviewed and reconciled the medication list.     Objective:   Physical Exam  Constitutional: No distress.   Cardiovascular: Normal rate, regular rhythm and normal heart sounds.  Exam reveals no gallop and no friction rub.   No murmur heard. Pulmonary/Chest: Effort normal and breath sounds normal. No respiratory distress. She has no wheezes. She has no rales.  Abdominal: Bowel sounds are normal. She exhibits no distension. There is no tenderness. There is no guarding.  Musculoskeletal: She exhibits no edema.        Assessment & Plan:

## 2013-06-25 NOTE — Assessment & Plan Note (Addendum)
Assessment: Patient appears to be doing well on her current regimen.  She reports that she was dismissed from the practice of her previous psychiatrist in June, and told to establish with a new psychiatrist.  She reports that she has seen a therapist there but has not yet seen the psychiatrist, and she reports that she has an appointment with the psychiatrist this month.  She requested that I refill her alprazolam until she establishes with her new psychiatrist.  Plan: I refilled a one-month supply of alprazolam 1 mg 4 times a day as needed for anxiety.  Patient was previously on an SSRI, but that was stopped prior to her last visit.  I advised her to make sure she keeps the appointment with her psychiatrist next month, and that I would expect her psychiatrist to manage her alprazolam and other medications for anxiety and depression.  I advised her to discuss with her psychiatrist whether an SSRI should be restarted.

## 2013-06-25 NOTE — Assessment & Plan Note (Signed)
Lipids:    Component Value Date/Time   CHOL 115 03/05/2013 1017   TRIG 169* 03/05/2013 1017   HDL 37* 03/05/2013 1017   LDLCALC 44 03/05/2013 1017   VLDL 34 03/05/2013 1017   CHOLHDL 3.1 03/05/2013 1017    Assessment: Patient is doing well on rosuvastatin 10 mg daily.  Plan: Continue rosuvastatin 10 mg daily.

## 2013-06-27 ENCOUNTER — Telehealth: Payer: Self-pay | Admitting: *Deleted

## 2013-06-27 MED ORDER — GLUCOSE BLOOD VI STRP: ORAL_STRIP | Status: DC

## 2013-06-27 MED ORDER — FERROUS SULFATE 325 (65 FE) MG PO TABS: 325.0000 mg | ORAL_TABLET | Freq: Two times a day (BID) | ORAL | Status: DC

## 2013-06-27 NOTE — Progress Notes (Signed)
Quick Note:  Ferritin is in the low normal range; will resume ferrous sulfate. ______

## 2013-06-27 NOTE — Telephone Encounter (Signed)
Left message on ID phone recording about iron tab per Dr Meredith Pel - to keep blood levels stable.

## 2013-06-27 NOTE — Telephone Encounter (Signed)
[facility] called needs new Rx for Accu chek  compact test strips - needs how many times per day to check CBG, correct number test strips, dx code, needs signature and then fax to pharmacy at [phone]. [facility] states insurance requires all requirements. Stanton Kidney Aneesha Holloran RN 06/27/13 2:45PM

## 2013-06-27 NOTE — Telephone Encounter (Signed)
Refill printed and signed - nurse to complete.  Also, please have patient resume ferrous sulfate 325 mg twice a day.

## 2013-07-02 ENCOUNTER — Ambulatory Visit: Payer: Medicare HMO | Admitting: Internal Medicine

## 2013-07-02 ENCOUNTER — Encounter: Payer: Self-pay | Admitting: Internal Medicine

## 2013-07-08 ENCOUNTER — Encounter (HOSPITAL_COMMUNITY): Payer: Medicare HMO

## 2013-07-10 ENCOUNTER — Encounter (HOSPITAL_COMMUNITY): Payer: Medicare HMO

## 2013-07-11 ENCOUNTER — Other Ambulatory Visit: Payer: Self-pay | Admitting: *Deleted

## 2013-07-11 MED ORDER — HYDROMORPHONE HCL 2 MG PO TABS: 2.0000 mg | ORAL_TABLET | Freq: Three times a day (TID) | ORAL | Status: DC | PRN

## 2013-07-11 NOTE — Telephone Encounter (Signed)
Rx ready - pt was called.

## 2013-07-11 NOTE — Telephone Encounter (Signed)
Refill printed and signed - nurse to complete. 

## 2013-08-12 ENCOUNTER — Other Ambulatory Visit: Payer: Self-pay | Admitting: *Deleted

## 2013-08-13 MED ORDER — HYDROMORPHONE HCL 2 MG PO TABS: 2.0000 mg | ORAL_TABLET | Freq: Three times a day (TID) | ORAL | Status: DC | PRN

## 2013-08-13 NOTE — Telephone Encounter (Signed)
Called pt.

## 2013-08-13 NOTE — Telephone Encounter (Signed)
I printed and signed 3 prescriptions for hydromorphone 2 mg tablets, #90, take 1 tablet every 8 hours as needed for pain as follows:  a prescription to be refilled today 08/13/2013  a prescription to be filled no earlier than 09/12/2013  a prescription to be filled no earlier than 10/12/2013  The prescriptions were provided to the refill nurse, 1 to be given to the patient today, the other 2 to be placed on file.

## 2013-09-01 ENCOUNTER — Other Ambulatory Visit: Payer: Self-pay | Admitting: Internal Medicine

## 2013-09-12 ENCOUNTER — Other Ambulatory Visit: Payer: Self-pay | Admitting: *Deleted

## 2013-09-12 MED ORDER — CARISOPRODOL 350 MG PO TABS: ORAL_TABLET | ORAL | Status: DC

## 2013-09-12 NOTE — Telephone Encounter (Signed)
Refill printed and signed - nurse to call in.

## 2013-09-12 NOTE — Telephone Encounter (Signed)
SOMA rx called to [facility].

## 2013-09-12 NOTE — Telephone Encounter (Signed)
Pt also informed Dilaudid rx ready for her to pick up.

## 2013-10-06 ENCOUNTER — Telehealth: Payer: Self-pay | Admitting: *Deleted

## 2013-10-06 NOTE — Telephone Encounter (Signed)
Returned pt's call - she had called about Dilaudid refill which is ready for pick up; no answer, message left.

## 2013-10-08 ENCOUNTER — Encounter: Payer: Medicare HMO | Admitting: Internal Medicine

## 2013-10-09 ENCOUNTER — Encounter (HOSPITAL_COMMUNITY): Payer: Medicare HMO

## 2013-10-10 ENCOUNTER — Ambulatory Visit (INDEPENDENT_AMBULATORY_CARE_PROVIDER_SITE_OTHER): Payer: Medicare HMO | Admitting: *Deleted

## 2013-10-10 DIAGNOSIS — Z23 Encounter for immunization: Secondary | ICD-10-CM

## 2013-10-18 ENCOUNTER — Other Ambulatory Visit: Payer: Self-pay | Admitting: Internal Medicine

## 2013-10-23 ENCOUNTER — Ambulatory Visit (HOSPITAL_COMMUNITY)
Admission: RE | Admit: 2013-10-23 | Discharge: 2013-10-23 | Disposition: A | Discharge status: Home / Self Care | Payer: Medicare HMO | Source: Ambulatory Visit | Attending: Internal Medicine | Admitting: Internal Medicine

## 2013-10-23 DIAGNOSIS — J45909 Unspecified asthma, uncomplicated: Secondary | ICD-10-CM | POA: Insufficient documentation

## 2013-10-23 MED ORDER — ALBUTEROL SULFATE (5 MG/ML) 0.5% IN NEBU
2.5000 mg | INHALATION_SOLUTION | Freq: Once | RESPIRATORY_TRACT | Status: AC
  Administered 2013-10-23: 2.5 mg via RESPIRATORY_TRACT

## 2013-11-06 ENCOUNTER — Other Ambulatory Visit: Payer: Self-pay | Admitting: *Deleted

## 2013-11-06 NOTE — Telephone Encounter (Signed)
Last refill 11/21 Call when ready @ [phone]

## 2013-11-07 ENCOUNTER — Emergency Department (HOSPITAL_COMMUNITY)
Admission: EM | Admit: 2013-11-07 | Discharge: 2013-11-07 | Disposition: A | Discharge status: Home / Self Care | Payer: Medicare HMO | Attending: Emergency Medicine | Admitting: Emergency Medicine

## 2013-11-07 ENCOUNTER — Encounter (HOSPITAL_COMMUNITY): Payer: Self-pay | Admitting: Emergency Medicine

## 2013-11-07 DIAGNOSIS — F329 Major depressive disorder, single episode, unspecified: Secondary | ICD-10-CM | POA: Insufficient documentation

## 2013-11-07 DIAGNOSIS — M79609 Pain in unspecified limb: Secondary | ICD-10-CM | POA: Insufficient documentation

## 2013-11-07 DIAGNOSIS — I10 Essential (primary) hypertension: Secondary | ICD-10-CM | POA: Insufficient documentation

## 2013-11-07 DIAGNOSIS — J4489 Other specified chronic obstructive pulmonary disease: Secondary | ICD-10-CM | POA: Insufficient documentation

## 2013-11-07 DIAGNOSIS — F411 Generalized anxiety disorder: Secondary | ICD-10-CM | POA: Insufficient documentation

## 2013-11-07 DIAGNOSIS — F3289 Other specified depressive episodes: Secondary | ICD-10-CM | POA: Insufficient documentation

## 2013-11-07 DIAGNOSIS — Z872 Personal history of diseases of the skin and subcutaneous tissue: Secondary | ICD-10-CM | POA: Insufficient documentation

## 2013-11-07 DIAGNOSIS — D509 Iron deficiency anemia, unspecified: Secondary | ICD-10-CM | POA: Insufficient documentation

## 2013-11-07 DIAGNOSIS — E119 Type 2 diabetes mellitus without complications: Secondary | ICD-10-CM | POA: Insufficient documentation

## 2013-11-07 DIAGNOSIS — E039 Hypothyroidism, unspecified: Secondary | ICD-10-CM | POA: Insufficient documentation

## 2013-11-07 DIAGNOSIS — IMO0002 Reserved for concepts with insufficient information to code with codable children: Secondary | ICD-10-CM | POA: Insufficient documentation

## 2013-11-07 DIAGNOSIS — Z8742 Personal history of other diseases of the female genital tract: Secondary | ICD-10-CM | POA: Insufficient documentation

## 2013-11-07 DIAGNOSIS — Z8619 Personal history of other infectious and parasitic diseases: Secondary | ICD-10-CM | POA: Insufficient documentation

## 2013-11-07 DIAGNOSIS — Z79899 Other long term (current) drug therapy: Secondary | ICD-10-CM | POA: Insufficient documentation

## 2013-11-07 DIAGNOSIS — E669 Obesity, unspecified: Secondary | ICD-10-CM | POA: Insufficient documentation

## 2013-11-07 DIAGNOSIS — Z880 Allergy status to penicillin: Secondary | ICD-10-CM | POA: Insufficient documentation

## 2013-11-07 DIAGNOSIS — Z8701 Personal history of pneumonia (recurrent): Secondary | ICD-10-CM | POA: Insufficient documentation

## 2013-11-07 DIAGNOSIS — E7800 Pure hypercholesterolemia, unspecified: Secondary | ICD-10-CM | POA: Insufficient documentation

## 2013-11-07 DIAGNOSIS — Z87891 Personal history of nicotine dependence: Secondary | ICD-10-CM | POA: Insufficient documentation

## 2013-11-07 DIAGNOSIS — Z792 Long term (current) use of antibiotics: Secondary | ICD-10-CM | POA: Insufficient documentation

## 2013-11-07 DIAGNOSIS — M545 Low back pain, unspecified: Secondary | ICD-10-CM | POA: Insufficient documentation

## 2013-11-07 DIAGNOSIS — J449 Chronic obstructive pulmonary disease, unspecified: Secondary | ICD-10-CM | POA: Insufficient documentation

## 2013-11-07 DIAGNOSIS — K219 Gastro-esophageal reflux disease without esophagitis: Secondary | ICD-10-CM | POA: Insufficient documentation

## 2013-11-07 DIAGNOSIS — G8929 Other chronic pain: Secondary | ICD-10-CM

## 2013-11-07 MED ORDER — KETOROLAC TROMETHAMINE 60 MG/2ML IM SOLN
60.0000 mg | Freq: Once | INTRAMUSCULAR | Status: AC
  Administered 2013-11-07: 60 mg via INTRAMUSCULAR
  Filled 2013-11-07: qty 2

## 2013-11-07 MED ORDER — HYDROMORPHONE HCL PF 2 MG/ML IJ SOLN
2.0000 mg | Freq: Once | INTRAMUSCULAR | Status: AC
  Administered 2013-11-07: 2 mg via INTRAVENOUS
  Filled 2013-11-07: qty 1

## 2013-11-07 MED ORDER — HYDROMORPHONE HCL 2 MG PO TABS: 2.0000 mg | ORAL_TABLET | Freq: Four times a day (QID) | ORAL | Status: DC | PRN

## 2013-11-07 MED ORDER — HYDROMORPHONE HCL PF 1 MG/ML IJ SOLN
1.0000 mg | Freq: Once | INTRAMUSCULAR | Status: AC
  Administered 2013-11-07: 1 mg via INTRAVENOUS
  Filled 2013-11-07: qty 1

## 2013-11-07 NOTE — ED Notes (Signed)
Pt states back pain for months and  Months  And was to have surgery but did not pain has gotten worse and she is requesting emergency surgery on back dr Lovell Sheehan follows her for her nack

## 2013-11-07 NOTE — Telephone Encounter (Signed)
Patient was seen in ED today for poorly controlled back pain - see their note.  They advised increasing the hydromorphone dose pending evaluation by Dr. Lovell Sheehan.  I refilled hydromorphone and increased frequency from 2 mg Q8h PRN to a dose of 2 mg one tablet Q6h PRN severe pain, which she has been on in the past without problems.  I would advise that she F/U in [facility] in the near future.  I printed and signed the prescription and gave it to the refill nurse.

## 2013-11-07 NOTE — ED Provider Notes (Signed)
Medical screening examination/treatment/procedure(s) were performed by non-physician practitioner and as supervising physician I was immediately available for consultation/collaboration.  EKG Interpretation   None         Gwyneth Sprout, MD 11/07/13 1622

## 2013-11-07 NOTE — ED Notes (Signed)
Pt c/o R lower back pain for past 3 months. Pt has chronic back pain and was supposed to have surgery last year but did not. Pain is worse over past 3 months. Pt taking 2 mg dilaudid orally at home with no relief

## 2013-11-07 NOTE — ED Provider Notes (Signed)
CSN: [id number]     Arrival date & time 11/07/13  1010 History  This chart was scribed for non-physician practitioner Junius Finner, PA-C, working with Gwyneth Sprout, MD by Dorothey Baseman, ED Scribe. This patient was seen in room TR11C/TR11C and the patient's care was started at 10:53 AM.    Chief Complaint  Patient presents with  . Back Pain   The history is provided by the patient. No language interpreter was used.   HPI Comments: Mary Gray is a 52 y.o. Female with a history of chronic back pain secondary to degenerative disc disease and spinal stenosis who presents to the Emergency Department complaining of an intermittent, stabbing pain to the lower back that has been progressively worsening for the past 3 months and became so severe yesterday that she states she was unable to walk. She reports that the pain will radiate down the back of the right leg. Patient reports some associated swelling to the lower legs. She denies any recent potential injury or trauma to the area or heavy lifting. Patient reports that she has a neurosurgeon (Dr. Lovell Sheehan) and that she called his nurse yesterday and patient states that she was advised to "come here to be admitted for emergency surgery." She reports that she has an appointment to follow up with the surgeon on 11/28/2012. Patient reports taking 2 mg of Dilaudid daily PO (prescribed by Dr. [name]) and applying [facility] to the areas at home without relief. Patient reports that she uses a walker for daily ambulation. She denies any numbness or weakness. Patient also has a history of DM, HTN, hypercholesterolemia, and hypothyroidism.   Past Medical History  Diagnosis Date  . Diabetes mellitus, type 2   . Hypertension   . Unspecified asthma(493.90)   . Anxiety state, unspecified   . Hypercholesteremia   . Hypothyroidism   . Obesity   . GERD (gastroesophageal reflux disease)   . Iron deficiency anemia   . DDD (degenerative disc disease), lumbar   .  Spinal stenosis   . Low back pain   . Degeneration of cervical intervertebral disc   . Left knee pain     Following fall 11/15/2009.  Marland Kitchen Acne rosacea   . Heme positive stool     EGD on 04/08/2010 by Dr. Ewing Schlein showed chronic gastritis and a few gastric polyps; pathology showed a fundic gland polyp.  Colonoscopy on 04/08/2010 showed small external and internal hemorrhoids, and a few benign-appearing diminutive polyps in the rectum, the distal sigmoid colon, and in the distal descending colon; pathology showed hyperplastic polyps.   . Hematochezia   . Irregular menses   . Vagina bleeding   . Atrophic vaginitis   . Viral warts, unspecified     Right hand.  . Chronic interstitial cystitis     Followed by Dr. Marcine Matar  . Elevated transaminase level     Mild elevation, AST=48, ALT=51 on 02/01/07.  . Allergic rhinitis   . Carpal tunnel syndrome   . Atypical chest pain 12/11/2008    Normal stress nuclear study 01/12/2009 by Dr. Peter [place]  . Bilateral leg edema   . COPD (chronic obstructive pulmonary disease)   . Dysrhythmia   . Depression   . Shortness of breath   . Pneumonia   . HYQMVHQI(696.2)    Past Surgical History  Procedure Laterality Date  . Carpal tunnel release  05/08/2000    By Dr. Katy Fitch. Sypher, Montez Hageman.   Family History  Problem Relation Age of  Onset  . Heart attack Father 25  . Breast cancer Neg Hx   . Colon cancer Neg Hx   . Diabetes Mother   . Hypertension Mother   . Diabetes Sister    History  Substance Use Topics  . Smoking status: Former Smoker -- 1.00 packs/day for 15 years    Types: Cigarettes    Quit date: 01/18/1996  . Smokeless tobacco: Never Used  . Alcohol Use: No   OB History   Grav Para Term Preterm Abortions TAB SAB Ect Mult Living                 Review of Systems  Cardiovascular: Positive for leg swelling.  Musculoskeletal: Positive for back pain.  Neurological: Negative for weakness and numbness.  All other systems reviewed and  are negative.    Allergies  Codeine sulfate; Meperidine and related; Minocycline hcl; Penicillins; Propoxyphene-acetaminophen; and Tetracycline hcl  Home Medications   Current Outpatient Rx  Name  Route  Sig  Dispense  Refill  . albuterol (PROAIR HFA) 108 (90 BASE) MCG/ACT inhaler   Inhalation   Inhale 2 puffs into the lungs every 6 (six) hours as needed for wheezing or shortness of breath.   1 Inhaler   11   . albuterol (PROVENTIL) (2.5 MG/3ML) 0.083% nebulizer solution   Nebulization   Take 3 mLs (2.5 mg total) by nebulization every 6 (six) hours as needed for wheezing.   75 mL   12   . ALPRAZolam (XANAX) 1 MG tablet   Oral   Take 1 mg by mouth 4 (four) times daily as needed for anxiety.         Marland Kitchen amitriptyline (ELAVIL) 25 MG tablet   Oral   Take 50 mg by mouth at bedtime.         . budesonide-formoterol (SYMBICORT) 160-4.5 MCG/ACT inhaler   Inhalation   Inhale 2 puffs into the lungs 2 (two) times daily.         . carisoprodol (SOMA) 350 MG tablet   Oral   Take 350 mg by mouth 4 (four) times daily as needed for muscle spasms.         . enalapril (VASOTEC) 5 MG tablet   Oral   Take 5 mg by mouth daily.         Marland Kitchen erythromycin ethylsuccinate (EES) 400 MG tablet   Oral   Take 400 mg by mouth 2 (two) times daily. Maintenance for Rosacea         . esomeprazole (NEXIUM) 40 MG capsule   Oral   Take 40 mg by mouth daily before breakfast.         . ferrous sulfate 325 (65 FE) MG tablet   Oral   Take 325 mg by mouth 3 (three) times daily with meals.         . fluticasone (FLONASE) 50 MCG/ACT nasal spray   Each Nare   Place 2 sprays into both nostrils daily.         Marland Kitchen ipratropium (ATROVENT HFA) 17 MCG/ACT inhaler   Inhalation   Inhale 2 puffs into the lungs every 4 (four) hours as needed for wheezing.         Marland Kitchen KRISTALOSE 20 G packet   Oral   Take 20 g by mouth 2 (two) times daily as needed (constipation).          Marland Kitchen levothyroxine  (SYNTHROID, LEVOTHROID) 75 MCG tablet   Oral   Take 75 mcg  by mouth daily before breakfast.         . metFORMIN (GLUCOPHAGE-XR) 500 MG 24 hr tablet   Oral   Take 500-1,000 mg by mouth daily with breakfast. Take two tablets in the morning and one table in the evening.         . nystatin (MYCOSTATIN) powder      Apply topically two times a day to affected areas of skin for 10 days.   15 g   1   . rOPINIRole (REQUIP) 3 MG tablet   Oral   Take 3 mg by mouth at bedtime.         . rosuvastatin (CRESTOR) 10 MG tablet   Oral   Take 10 mg by mouth daily.         . solifenacin (VESICARE) 10 MG tablet   Oral   Take 10 mg by mouth daily.           . traMADol (ULTRAM) 50 MG tablet   Oral   Take 50 mg by mouth every 6 (six) hours as needed (pain).         . triamcinolone (KENALOG) 0.1 % paste      Apply a small amount to mouth ulcer 3 times a day; use only enough to cover the ulcer with a thin film; do not rub in.   5 g   1   . zolpidem (AMBIEN) 10 MG tablet   Oral   Take 10 mg by mouth at bedtime as needed for sleep.         Marland Kitchen HYDROmorphone (DILAUDID) 2 MG tablet   Oral   Take 1 tablet (2 mg total) by mouth every 6 (six) hours as needed for severe pain.   120 tablet   0    Triage Vitals: BP 123/66  Pulse 90  Temp(Src) 97.9 F (36.6 C) (Oral)  Resp 16  Ht 5\' 1"  (1.549 m)  SpO2 96%  LMP 01/17/2009  Physical Exam  Nursing note and vitals reviewed. Constitutional: She is oriented to person, place, and time. She appears well-developed and well-nourished.  HENT:  Head: Normocephalic and atraumatic.  Eyes: EOM are normal.  Neck: Normal range of motion.  Cardiovascular: Normal rate, regular rhythm and normal heart sounds.   Pulmonary/Chest: Effort normal and breath sounds normal. No respiratory distress.  Musculoskeletal: Normal range of motion.  Point tenderness along lumbar spine and paraspinal muscles. Pain with sitting up. States unable to walk.    Neurological: She is alert and oriented to person, place, and time.  Skin: Skin is warm and dry.  Psychiatric: She has a normal mood and affect. Her behavior is normal.    ED Course  Procedures (including critical care time)  DIAGNOSTIC STUDIES: Oxygen Saturation is 96% on room air, normal by my interpretation.    COORDINATION OF CARE: 10:59 AM- Will review patient's past charts. Will order medication to manage symptoms. Discussed treatment plan with patient at bedside and patient verbalized agreement.     Labs Review Labs Reviewed - No data to display Imaging Review No results found.  EKG Interpretation   None       MDM   1. Chronic low back pain    Pt presenting stating she was advised by Dr. Lovell Sheehan office to come to ER for emergency back surgery.  Consulted with Dr. Lovell Sheehan who reports having not seen patient in over 1 year.  Pt was advised to come to ER for better pain control.  There are no red flag symptoms. Pt able to ambulate with assistance of walker.  Pain improved some with 3mg  dilaudid IM and toradol.  Pt takes 1mg  dilaudid BID daily for chronic pain. Discussed with pt importance to f/u with Dr. Meredith Pel who prescribes her diluadid to ask for increase in dosing until pt able to f/u with neurosurgery as scheduled for Jan 7th.   Will discharge pt home and have pt f/u with Dr. Meredith Pel, PCP. Return precautions given. Pt verbalized understanding and agreement with tx plan. Vitals: unremarkable. Discharged in stable condition.    Discussed pt with attending during ED encounter and agrees with plan.  I personally performed the services described in this documentation, which was scribed in my presence. The recorded information has been reviewed and is accurate.     Junius Finner, PA-C 11/07/13 1553

## 2013-11-07 NOTE — ED Notes (Signed)
Pa  erin spoke to dr Lovell Sheehan

## 2013-11-07 NOTE — ED Notes (Signed)
Pt states that before she came here she had appointment  W/ dr Lovell Sheehan on 11/26/13 and  Dr Augusto Garbe on the 1/7.

## 2013-11-12 LAB — PULMONARY FUNCTION TEST
DL/VA % pred: 113 %
DL/VA: 4.8 ml/min/mmHg/L
DLCO unc: 17.6 ml/min/mmHg
FEF 25-75 Post: 1.97 L/sec
FEF 25-75 Pre: 1.53 L/sec
FEF2575-%Change-Post: 28 %
FEF2575-%Pred-Pre: 62 %
FEV1-%Change-Post: 5 %
FEV1-%Pred-Post: 85 %
FEV1-Post: 2.01 L
FEV1FVC-%Change-Post: 12 %
FEV6-%Change-Post: -5 %
FEV6-%Pred-Pre: 86 %
FEV6-Post: 2.39 L
FEV6-Pre: 2.54 L
FEV6FVC-%Pred-Post: 103 %
FEV6FVC-%Pred-Pre: 102 %
FVC-%Pred-Post: 79 %
FVC-Pre: 2.54 L
Post FEV1/FVC ratio: 84 %
Post FEV6/FVC ratio: 100 %
Pre FEV6/FVC Ratio: 100 %
RV % pred: 73 %
RV: 1.21 L
TLC % pred: 84 %

## 2013-11-26 ENCOUNTER — Ambulatory Visit (INDEPENDENT_AMBULATORY_CARE_PROVIDER_SITE_OTHER): Payer: Medicare HMO | Admitting: Internal Medicine

## 2013-11-26 ENCOUNTER — Encounter: Payer: Self-pay | Admitting: Internal Medicine

## 2013-11-26 DIAGNOSIS — I10 Essential (primary) hypertension: Secondary | ICD-10-CM

## 2013-11-26 DIAGNOSIS — E7800 Pure hypercholesterolemia, unspecified: Secondary | ICD-10-CM

## 2013-11-26 DIAGNOSIS — E119 Type 2 diabetes mellitus without complications: Secondary | ICD-10-CM

## 2013-11-26 DIAGNOSIS — M5430 Sciatica, unspecified side: Secondary | ICD-10-CM

## 2013-11-26 DIAGNOSIS — D509 Iron deficiency anemia, unspecified: Secondary | ICD-10-CM

## 2013-11-26 LAB — FERRITIN: Ferritin: 116 ng/mL (ref 10–291)

## 2013-11-26 LAB — CBC WITH DIFFERENTIAL/PLATELET
Basophils Absolute: 0.1 10*3/uL (ref 0.0–0.1)
Basophils Relative: 1 % (ref 0–1)
Eosinophils Absolute: 0.5 10*3/uL (ref 0.0–0.7)
Eosinophils Relative: 7 % — ABNORMAL HIGH (ref 0–5)
HCT: 36.8 % (ref 36.0–46.0)
Hemoglobin: 12.6 g/dL (ref 12.0–15.0)
LYMPHS PCT: 28 % (ref 12–46)
Lymphs Abs: 2 10*3/uL (ref 0.7–4.0)
MCH: 29 pg (ref 26.0–34.0)
MCHC: 34.2 g/dL (ref 30.0–36.0)
MCV: 84.6 fL (ref 78.0–100.0)
MONOS PCT: 6 % (ref 3–12)
Monocytes Absolute: 0.4 10*3/uL (ref 0.1–1.0)
NEUTROS PCT: 58 % (ref 43–77)
Neutro Abs: 4.4 10*3/uL (ref 1.7–7.7)
PLATELETS: 236 10*3/uL (ref 150–400)
RBC: 4.35 MIL/uL (ref 3.87–5.11)
RDW: 14.3 % (ref 11.5–15.5)
WBC: 7.4 10*3/uL (ref 4.0–10.5)

## 2013-11-26 LAB — COMPLETE METABOLIC PANEL WITH GFR
ALBUMIN: 4.4 g/dL (ref 3.5–5.2)
ALT: 24 U/L (ref 0–35)
AST: 27 U/L (ref 0–37)
Alkaline Phosphatase: 58 U/L (ref 39–117)
BUN: 6 mg/dL (ref 6–23)
CHLORIDE: 93 meq/L — AB (ref 96–112)
CO2: 29 meq/L (ref 19–32)
Calcium: 8.9 mg/dL (ref 8.4–10.5)
Creat: 0.5 mg/dL (ref 0.50–1.10)
GFR, Est African American: >89 mL/min
GFR, Est Non African American: >89 mL/min
GLUCOSE: 131 mg/dL — AB (ref 70–99)
POTASSIUM: 4.4 meq/L (ref 3.5–5.3)
Sodium: 132 mEq/L — ABNORMAL LOW (ref 135–145)
TOTAL PROTEIN: 6.4 g/dL (ref 6.0–8.3)
Total Bilirubin: 0.4 mg/dL (ref 0.3–1.2)

## 2013-11-26 LAB — POCT GLYCOSYLATED HEMOGLOBIN (HGB A1C): HEMOGLOBIN A1C: 6.6

## 2013-11-26 LAB — GLUCOSE, CAPILLARY: Glucose-Capillary: 201 mg/dL — ABNORMAL HIGH (ref 70–99)

## 2013-11-26 MED ORDER — ROPINIROLE HCL 3 MG PO TABS: 3.0000 mg | ORAL_TABLET | Freq: Every day | ORAL | Status: DC

## 2013-11-26 MED ORDER — ENALAPRIL MALEATE 5 MG PO TABS: 2.5000 mg | ORAL_TABLET | Freq: Every day | ORAL | Status: DC

## 2013-11-26 MED ORDER — CARISOPRODOL 350 MG PO TABS: 350.0000 mg | ORAL_TABLET | Freq: Four times a day (QID) | ORAL | Status: DC | PRN

## 2013-11-26 MED ORDER — TRAMADOL HCL 50 MG PO TABS: 50.0000 mg | ORAL_TABLET | Freq: Four times a day (QID) | ORAL | Status: DC | PRN

## 2013-11-26 MED ORDER — FUROSEMIDE 20 MG PO TABS: 20.0000 mg | ORAL_TABLET | ORAL | Status: DC

## 2013-11-26 MED ORDER — ESOMEPRAZOLE MAGNESIUM 40 MG PO CPDR: 40.0000 mg | DELAYED_RELEASE_CAPSULE | Freq: Every day | ORAL | Status: DC

## 2013-11-26 MED ORDER — FERROUS SULFATE 325 (65 FE) MG PO TABS: 325.0000 mg | ORAL_TABLET | Freq: Three times a day (TID) | ORAL | Status: DC

## 2013-11-26 MED ORDER — AMITRIPTYLINE HCL 25 MG PO TABS: 50.0000 mg | ORAL_TABLET | Freq: Every day | ORAL | Status: DC

## 2013-11-26 MED ORDER — METFORMIN HCL ER 500 MG PO TB24: ORAL_TABLET | ORAL | Status: DC

## 2013-11-26 NOTE — Assessment & Plan Note (Signed)
Lab Results  Component Value Date   HGBA1C 6.6 11/26/2013   HGBA1C 6.7 06/25/2013   HGBA1C 7.1 03/05/2013     Assessment: Diabetes control: good control (HgbA1C at goal) Progress toward A1C goal:  at goal Comments: Patient is doing well on metformin XR 1000 mg each morning and 500 mg each evening. Plan: Medications:  continue current medications Home glucose monitoring: Frequency: once a day Timing: before breakfast Instruction/counseling given: reminded to bring medications to each visit Other plans: check a comprehensive panel today

## 2013-11-26 NOTE — Patient Instructions (Signed)
General Instructions: Start furosemide 20 mg one tablet every other day. Change enalapril 5 mg to a dose of 1/2 tablet daily. Please keep your appointment with your neurosurgeon Dr. Lovell SheehanJenkins.  Progress Toward Treatment Goals:  Treatment Goal 11/26/2013  Hemoglobin A1C at goal  Blood pressure at goal  Prevent falls -    Self Care Goals & Plans:  Self Care Goal 11/26/2013  Manage my medications -  Monitor my health -  Eat healthy foods -  Be physically active -  Prevent falls -  Meeting treatment goals maintain the current self-care plan    Home Blood Glucose Monitoring 11/26/2013  Check my blood sugar once a day  When to check my blood sugar before breakfast     Care Management & Community Referrals:  Referral 11/26/2013  Referrals made for care management support none needed  Referrals made to community resources none

## 2013-11-26 NOTE — Progress Notes (Signed)
   Subjective:    Patient ID: Mary NegusSara Kozinski, female    DOB: 1961-04-27, 53 y.o.   MRN: [id number]  HPI Patient returns for followup of her low back pain with sciatica, diabetes mellitus, hypertension, and other chronic medical problems.  She was seen in the emergency department on 11/07/2013 with worsening back pain; she was advised then to follow up with her neurosurgeon Dr. Lovell SheehanJenkins, and she has an appointment scheduled for 11/28/2013 11:15 AM.  I increased her hydromorphone dose in December from 2 mg every 8 hours to a dose of 2 mg every 6 hours for pain unrelieved by tramadol.  Patient reports ongoing problems with her back pain, although it is somewhat better controlled on the more frequent dose of hydromorphone.  She has not been taking Lasix for several months by her report; she was previously on 60 mg each morning and 40 mg each evening.  She reports some increase in her weight and mild leg edema since she has been off of Lasix.  Review of Systems  Cardiovascular: Positive for leg swelling (Mild).  Musculoskeletal: Positive for back pain.       Objective:   Physical Exam  Constitutional: No distress.  Cardiovascular: Normal rate, regular rhythm and normal heart sounds.  Exam reveals no gallop and no friction rub.   No murmur heard. Pulmonary/Chest: Effort normal and breath sounds normal. No respiratory distress. She has no wheezes. She has no rales.  Abdominal: Soft. Bowel sounds are normal. She exhibits no distension. There is no tenderness. There is no guarding.  Musculoskeletal: She exhibits no edema.     Assessment & Plan:

## 2013-11-26 NOTE — Assessment & Plan Note (Signed)
Lipids:    Component Value Date/Time   CHOL 115 03/05/2013 1017   TRIG 169* 03/05/2013 1017   HDL 37* 03/05/2013 1017   LDLCALC 44 03/05/2013 1017   VLDL 34 03/05/2013 1017   CHOLHDL 3.1 03/05/2013 1017    Assessment: Patient is doing well on rosuvastatin 10 mg daily, with no apparent adverse effects.  Plan: Continue rosuvastatin 10 mg daily; check a lipid panel at next visit.

## 2013-11-26 NOTE — Assessment & Plan Note (Addendum)
Assessment: Patient had a recent worsening of her low back pain with sciatica prompting an emergency department visit in December.  It is somewhat better controlled since I increased the frequency of hydromorphone to every 6 hours as needed.    Plan: Continue tramadol 50 mg every 6 hours as needed for pain, carisoprodol 350 mg 4 times a day as needed for muscle spasms, and hydromorphone 2 mg every 6 hours as needed for pain unrelieved by tramadol.  Keep appointment as scheduled with Dr. Lovell SheehanJenkins on 11/28/2013 11:15 AM.

## 2013-11-26 NOTE — Assessment & Plan Note (Signed)
BP Readings from Last 3 Encounters:  11/26/13 140/79  11/07/13 116/72  06/25/13 114/72    Lab Results  Component Value Date   NA 138 06/25/2013   K 4.3 06/25/2013   CREATININE 0.56 06/25/2013    Assessment: Blood pressure control: controlled Progress toward BP goal:  at goal Comments: Blood pressure is controlled on enalapril 5 mg daily; patient does have some recurrence of lower extremity edema since she she stopped taking furosemide, and has a history of diastolic dysfunction by 2-D echocardiogram.  Plan: Medications:  Start furosemide 20 mg every other day; reduce enalapril to a dose of 2.5 mg daily

## 2013-11-26 NOTE — Assessment & Plan Note (Signed)
Lab Results  Component Value Date   HGB 11.0* 06/25/2013   HGB 10.9* 05/01/2013   HGB 11.2* 03/28/2013     Assessment: Patient has no symptoms of anemia.  She reports that she has been taking her ferrous sulfate as prescribed.  Plan: Will check a CBC and ferritin level today; continue ferrous sulfate 325 mg 3 times a day with meals pending the results.

## 2013-11-27 ENCOUNTER — Other Ambulatory Visit: Payer: Self-pay | Admitting: Internal Medicine

## 2013-11-27 MED ORDER — TRAMADOL HCL 50 MG PO TABS: 50.0000 mg | ORAL_TABLET | Freq: Four times a day (QID) | ORAL | Status: DC | PRN

## 2013-11-27 MED ORDER — CARISOPRODOL 350 MG PO TABS: 350.0000 mg | ORAL_TABLET | Freq: Four times a day (QID) | ORAL | Status: DC | PRN

## 2013-11-27 NOTE — Progress Notes (Signed)
Called to pharm 

## 2013-11-27 NOTE — Progress Notes (Signed)
I was unable to print carisoprodol and tramadol prescriptions during office visit yesterday since the clinic printers were down.  Today I printed and signed those prescriptions and gave to refill nurse for patient to pick up.

## 2013-12-03 ENCOUNTER — Encounter: Payer: Self-pay | Admitting: Podiatrist

## 2013-12-03 ENCOUNTER — Ambulatory Visit (INDEPENDENT_AMBULATORY_CARE_PROVIDER_SITE_OTHER): Payer: Medicare HMO | Admitting: Podiatrist

## 2013-12-03 VITALS — BP 170/105 | HR 102 | Resp 18

## 2013-12-03 DIAGNOSIS — B351 Tinea unguium: Secondary | ICD-10-CM

## 2013-12-03 DIAGNOSIS — G589 Mononeuropathy, unspecified: Secondary | ICD-10-CM

## 2013-12-03 DIAGNOSIS — G2581 Restless legs syndrome: Secondary | ICD-10-CM

## 2013-12-03 DIAGNOSIS — M79609 Pain in unspecified limb: Secondary | ICD-10-CM

## 2013-12-03 DIAGNOSIS — G629 Polyneuropathy, unspecified: Secondary | ICD-10-CM

## 2013-12-03 MED ORDER — ROPINIROLE HCL 4 MG PO TABS: 4.0000 mg | ORAL_TABLET | Freq: Every day | ORAL | Status: DC

## 2013-12-03 MED ORDER — PREGABALIN 75 MG PO CAPS: 75.0000 mg | ORAL_CAPSULE | Freq: Two times a day (BID) | ORAL | Status: DC

## 2013-12-03 NOTE — Patient Instructions (Signed)
i have increased your requip (restless leg medication) and I am starting you back on lyrica- 75 mg twice daily.  Start taking lyrica only once daily for 1 week, then start taking it twice daily thereafter.  If you have any problems with the medication please let me know.

## 2013-12-03 NOTE — Progress Notes (Signed)
Subjective: Mary Gray presents today for diabetic foot check and routine nail care. She states that the restless leg syndrome is really getting worse and that I did have her on Lyrica at one time that helped her symptoms however once her symptoms improved she stopped taking it. She feels she may need the medication again. She relates pain at night and restless leg syndrome all the time.  Objective: Vascular exam reveals palpable DP and PT pulses which are strong bilateral. Capillary refill time is within normal limits bilateral. Neurological sensation continues to reveal a decrease in sensation via Semmes Weinstein monofilament at 3/5 sites bilateral. Light touch and vibratory sensation are also decreased bilateral. Patient's toenails are elongated, thickened, discolored, dystrophic and clinically mycotic and uncomfortable with ambulation and in shoe gear.  Assessment: Diabetic neuropathy, restless leg syndrome, symptomatic mycotic toenails   Plan: Debridement of toenails carried out today without complication. Increase her ropinirole to 4 mg by mouth each bedtime and started her back on Lyrica 75 mg twice a day. I will see her back for routine care in 3 months and we will reevaluate how her pain and restless leg syndrome are doing then. I did mention to the patient the possibility of a referral to a neurologist for continued care should her symptoms not improve

## 2013-12-04 ENCOUNTER — Other Ambulatory Visit: Payer: Self-pay | Admitting: Internal Medicine

## 2013-12-04 NOTE — Telephone Encounter (Signed)
Please call patient and advise her that she can stop the ferrous sulfate based upon her recent labs.

## 2013-12-05 ENCOUNTER — Telehealth: Payer: Self-pay | Admitting: *Deleted

## 2013-12-05 MED ORDER — GLUCOSE BLOOD VI STRP: ORAL_STRIP | Status: DC

## 2013-12-05 NOTE — Telephone Encounter (Signed)
Pt request refill for Accu-chek compact strips Test once a day.

## 2013-12-05 NOTE — Telephone Encounter (Signed)
Rx called in 

## 2013-12-10 ENCOUNTER — Other Ambulatory Visit: Payer: Self-pay | Admitting: *Deleted

## 2013-12-10 ENCOUNTER — Telehealth: Payer: Self-pay | Admitting: Internal Medicine

## 2013-12-10 NOTE — Telephone Encounter (Signed)
Patient called to report that she saw Dr. Lovell SheehanJenkins recently and that he wanted to get a repeat MRI scan of her spine, but that her insurance company required that this be ordered by her primary care physician.  Will request a copy of his office note, and then place order as indicated.

## 2013-12-11 NOTE — Telephone Encounter (Signed)
I have called for and received the last note from Dr Lovell SheehanJenkins office.  I placed it in your box in medical records.  I talked with Chilon and she states that Dr Lovell SheehanJenkins needs to order the MRI.  She is calling his office to educate them. Will keep you posted.

## 2013-12-12 MED ORDER — HYDROMORPHONE HCL 2 MG PO TABS: 2.0000 mg | ORAL_TABLET | Freq: Four times a day (QID) | ORAL | Status: DC | PRN

## 2013-12-12 NOTE — Telephone Encounter (Signed)
Pt notified to pick up script at 1615 today

## 2013-12-12 NOTE — Telephone Encounter (Signed)
Refill printed and signed - nurse to complete. 

## 2013-12-13 ENCOUNTER — Other Ambulatory Visit: Payer: Self-pay | Admitting: Internal Medicine

## 2013-12-18 ENCOUNTER — Other Ambulatory Visit: Payer: Self-pay | Admitting: Neurosurgery

## 2013-12-18 DIAGNOSIS — M48061 Spinal stenosis, lumbar region without neurogenic claudication: Secondary | ICD-10-CM

## 2013-12-26 ENCOUNTER — Other Ambulatory Visit: Payer: Self-pay | Admitting: Internal Medicine

## 2013-12-26 ENCOUNTER — Ambulatory Visit
Admission: RE | Admit: 2013-12-26 | Discharge: 2013-12-26 | Disposition: A | Discharge status: Home / Self Care | Payer: Commercial Managed Care - HMO | Source: Ambulatory Visit | Attending: Neurosurgery | Admitting: Neurosurgery

## 2013-12-26 DIAGNOSIS — M48061 Spinal stenosis, lumbar region without neurogenic claudication: Secondary | ICD-10-CM

## 2013-12-30 ENCOUNTER — Encounter: Payer: Medicare HMO | Admitting: Internal Medicine

## 2014-01-09 ENCOUNTER — Other Ambulatory Visit: Payer: Self-pay | Admitting: *Deleted

## 2014-01-09 MED ORDER — HYDROMORPHONE HCL 2 MG PO TABS: 2.0000 mg | ORAL_TABLET | Freq: Four times a day (QID) | ORAL | Status: DC | PRN

## 2014-01-09 NOTE — Telephone Encounter (Signed)
Has late April a[ppt with Dr Meredith Peljoines. Printed off three rx

## 2014-01-09 NOTE — Telephone Encounter (Signed)
Rxs ready - pt called. 

## 2014-01-14 ENCOUNTER — Other Ambulatory Visit: Payer: Self-pay | Admitting: *Deleted

## 2014-01-14 NOTE — Telephone Encounter (Signed)
done

## 2014-01-22 ENCOUNTER — Other Ambulatory Visit: Payer: Self-pay | Admitting: Internal Medicine

## 2014-01-27 ENCOUNTER — Other Ambulatory Visit: Payer: Self-pay | Admitting: Internal Medicine

## 2014-01-29 NOTE — Telephone Encounter (Signed)
This medication was refilled on 01/23/19/15; please confirm received by pharmacy.

## 2014-02-11 NOTE — Telephone Encounter (Signed)
Last fill 3/5

## 2014-02-11 NOTE — Telephone Encounter (Signed)
Pt last fill 3/5

## 2014-03-03 ENCOUNTER — Telehealth: Payer: Self-pay | Admitting: *Deleted

## 2014-03-03 ENCOUNTER — Encounter: Payer: Self-pay | Admitting: Internal Medicine

## 2014-03-03 ENCOUNTER — Ambulatory Visit (INDEPENDENT_AMBULATORY_CARE_PROVIDER_SITE_OTHER): Payer: Medicare HMO | Admitting: Internal Medicine

## 2014-03-03 ENCOUNTER — Ambulatory Visit (HOSPITAL_COMMUNITY)
Admission: RE | Admit: 2014-03-03 | Discharge: 2014-03-03 | Disposition: A | Discharge status: Home / Self Care | Payer: Medicare HMO | Source: Ambulatory Visit | Attending: Internal Medicine | Admitting: Internal Medicine

## 2014-03-03 VITALS — BP 109/59 | HR 86 | Temp 97.7°F | Ht 61.5 in | Wt 211.6 lb

## 2014-03-03 DIAGNOSIS — M5137 Other intervertebral disc degeneration, lumbosacral region: Secondary | ICD-10-CM

## 2014-03-03 DIAGNOSIS — M5030 Other cervical disc degeneration, unspecified cervical region: Secondary | ICD-10-CM

## 2014-03-03 DIAGNOSIS — M25519 Pain in unspecified shoulder: Secondary | ICD-10-CM | POA: Insufficient documentation

## 2014-03-03 DIAGNOSIS — M47812 Spondylosis without myelopathy or radiculopathy, cervical region: Secondary | ICD-10-CM | POA: Diagnosis not present

## 2014-03-03 DIAGNOSIS — M25511 Pain in right shoulder: Secondary | ICD-10-CM

## 2014-03-03 DIAGNOSIS — B372 Candidiasis of skin and nail: Secondary | ICD-10-CM

## 2014-03-03 DIAGNOSIS — M79609 Pain in unspecified limb: Secondary | ICD-10-CM | POA: Insufficient documentation

## 2014-03-03 DIAGNOSIS — E119 Type 2 diabetes mellitus without complications: Secondary | ICD-10-CM

## 2014-03-03 DIAGNOSIS — M79601 Pain in right arm: Secondary | ICD-10-CM

## 2014-03-03 DIAGNOSIS — M549 Dorsalgia, unspecified: Secondary | ICD-10-CM

## 2014-03-03 DIAGNOSIS — G8929 Other chronic pain: Secondary | ICD-10-CM

## 2014-03-03 DIAGNOSIS — M542 Cervicalgia: Secondary | ICD-10-CM | POA: Insufficient documentation

## 2014-03-03 LAB — POCT GLYCOSYLATED HEMOGLOBIN (HGB A1C): HEMOGLOBIN A1C: 6.3

## 2014-03-03 LAB — GLUCOSE, CAPILLARY: Glucose-Capillary: 116 mg/dL — ABNORMAL HIGH (ref 70–99)

## 2014-03-03 MED ORDER — FLUCONAZOLE 150 MG PO TABS: ORAL_TABLET | ORAL | Status: DC

## 2014-03-03 MED ORDER — CARISOPRODOL 350 MG PO TABS: 350.0000 mg | ORAL_TABLET | Freq: Four times a day (QID) | ORAL | Status: DC | PRN

## 2014-03-03 MED ORDER — TRAMADOL HCL 50 MG PO TABS: 50.0000 mg | ORAL_TABLET | Freq: Four times a day (QID) | ORAL | Status: DC | PRN

## 2014-03-03 NOTE — Patient Instructions (Signed)
-  You may have the Xray of your shoulder and neck done at your earliest convenience.  -Follow up with your Neurosurgeon on Friday.  -Follow up with Dr. Meredith PelJoines on 4/29.   Please bring your medicines with you each time you come.   Medicines may be  Eye drops  Herbal   Vitamins  Pills  Seeing these help us take care of you.

## 2014-03-03 NOTE — Telephone Encounter (Signed)
Pt calls and states she has R arm pain x 3 days becoming worse. She denies chest pain, resp distress, h/a, N&V, one sided weakness, facial droop. She is scheduled w/ dr Garald Braverkennerly for [phone] and reminded if any of these symptoms occur and she is worried to call 911 or come to ED, she is agreeable

## 2014-03-05 ENCOUNTER — Telehealth: Payer: Self-pay | Admitting: Internal Medicine

## 2014-03-05 NOTE — Assessment & Plan Note (Addendum)
She has upcoming appointment with Neurosurgery.  Refilled Rx for tramadol 50 mg q6hr PRN for pain and soma (350 mg total) by mouth 4 (four) times daily as needed for muscle spasms

## 2014-03-05 NOTE — Assessment & Plan Note (Signed)
She mentioned after her visit that she has had a rash under her bilateral breasts and groin area that is not improving with nystatin powder. This rash was not evaluated as the patient had already left the examining room when she mentioned this problem. She states that she had a similar rash in the past that resolved with Diflucan treatment.  Pt advised to discontinue taking erythromycin while taking Diflucan (total of 5 days starting on 4/14 to 4/19) due to the known interaction with these two drugs.  Rx Diflucan 150mg  tablet once followed by 150mg  tablet after 72 hours.

## 2014-03-05 NOTE — Progress Notes (Signed)
   Subjective:    Patient ID: Mary Gray, female    DOB: 1961-09-29, 53 y.o.   MRN: [id number]  Arm Pain  Associated symptoms include numbness. Pertinent negatives include no chest pain.   Mary Gray is a 53 yr old woman with PMH significant for DM2, anxiety disorder, acne rosacea, spinal stenosis with lumbar and cervical disc disease who presents for evaluation of right arm pain and numbness and for medication refill. She reports having appointment with Neurosurgery soon for evaluation of spine surgery. Her right arm pain and numbness have been present for at least 4 months but have gradually worsened. She has relief of the pain with Dilaudid, tramadol, and soma. She has also been wrapping her arm with ACE band to provide more relief of her pain. She needs refills for her tramadol and soma.  Of note, after she had received her AVS and left the examination room she mentioned that she also has had a pruritic rash under her bilateral breasts and groin area and these areas were not examined. She has had this type of rash in the past that was successfully treated with Diflucan.     Review of Systems  Constitutional: Positive for fatigue. Negative for fever, chills, diaphoresis, activity change, appetite change and unexpected weight change.  Respiratory: Negative for cough, shortness of breath and wheezing.   Cardiovascular: Negative for chest pain, palpitations and leg swelling.  Gastrointestinal: Negative for abdominal pain.  Genitourinary: Negative for dysuria.  Musculoskeletal: Positive for arthralgias, back pain and gait problem.  Skin: Positive for rash.  Neurological: Positive for weakness and numbness. Negative for dizziness and headaches.  Psychiatric/Behavioral: Negative for agitation.       Objective:   Physical Exam  Nursing note and vitals reviewed. Constitutional: She is oriented to person, place, and time. No distress.  Drowsy appearing   Cardiovascular: Normal rate.     Pulmonary/Chest: Effort normal. No respiratory distress.  Musculoskeletal: She exhibits no edema and no tenderness.  Limited ROM of right arm 2/2 pain (pt unable to raise arm above a 90 degree angle), internal/external rotation limited 2/2 pain, positive Hawkins test.    Neurological: She is alert and oriented to person, place, and time. She exhibits normal muscle tone. Coordination abnormal.  Grip strength 4/5 in right hand, 5/5 in left hand. Decreased sensation in lateral right upper arm and deltoid area.  Pt uses walker to stabilize her gait.   Skin: Skin is warm and dry. She is not diaphoretic.  Psychiatric: She has a normal mood and affect.          Assessment & Plan:

## 2014-03-05 NOTE — Assessment & Plan Note (Addendum)
This could be secondary to her cervical disc disease thought rotator cuff injury could explain some of her pain.  -Refilled her tramadol and soma.  Ordered Xrays of her her right shoulder and cervical spine with following reports respectively: -No significant interval progression acromioclavicular joint degenerative osteoarthritis with downward directed osteophyte. This could predispose the patient to symptoms of shoulder impingement -Cervical spondylosis at C5-6 and also likely C6-7 based on oblique views showing suggestion of bony foraminal stenoses at these levels bilaterally.    Pt has follow up with Neurosurgery already set up.  Will also refer to Orthopedic Surgery for evaluation of acromioclavicular joint osteophyte evaluation. Will call and discuss findings with the patient.

## 2014-03-05 NOTE — Telephone Encounter (Signed)
Called Ms. Decaprio to inform her of the results of he right shoulder Xray and cervical spine Xray. She has an appointment with Dr. Lovell SheehanJenkins in Neurosurgery tomorrow morning and I explained to her that we are faxing the Xray reports today for his review. She was made aware that she may need referral to Orthopedic Surgery for her right shoulder osteophyte if recommended by Dr. Lovell SheehanJenkins.   She verbalized understanding and agreed with this plan.

## 2014-03-05 NOTE — Assessment & Plan Note (Addendum)
Lab Results  Component Value Date   HGBA1C 6.3 03/03/2014   HGBA1C 6.6 11/26/2013   HGBA1C 6.7 06/25/2013     Assessment: Diabetes control:  Controlled Progress toward A1C goal:   At goal Comments: she is on metformin XR 500mg  24 hr tablet  Plan: Medications:  continue current medications Home glucose monitoring: Frequency:   Timing:   Instruction/counseling given: reminded to bring blood glucose meter & log to each visit, reminded to bring medications to each visit, discussed foot care and discussed the need for weight loss Educational resources provided: brochure Self management tools provided: copy of home glucose meter download Other plans: She has lost 25 lbs already in preparation for her spine surgery, she has been eating more fruits and vegetables instead of processed foods. Her HgA1C has improved to 6.3%.

## 2014-03-18 ENCOUNTER — Encounter: Payer: Self-pay | Admitting: Internal Medicine

## 2014-03-18 ENCOUNTER — Ambulatory Visit (INDEPENDENT_AMBULATORY_CARE_PROVIDER_SITE_OTHER): Payer: Commercial Managed Care - HMO | Admitting: Internal Medicine

## 2014-03-18 VITALS — BP 106/65 | HR 79 | Temp 97.0°F | Wt 205.1 lb

## 2014-03-18 DIAGNOSIS — E7800 Pure hypercholesterolemia, unspecified: Secondary | ICD-10-CM

## 2014-03-18 DIAGNOSIS — M5430 Sciatica, unspecified side: Secondary | ICD-10-CM

## 2014-03-18 DIAGNOSIS — E119 Type 2 diabetes mellitus without complications: Secondary | ICD-10-CM

## 2014-03-18 DIAGNOSIS — M5440 Lumbago with sciatica, unspecified side: Secondary | ICD-10-CM

## 2014-03-18 DIAGNOSIS — I10 Essential (primary) hypertension: Secondary | ICD-10-CM

## 2014-03-18 LAB — LIPID PANEL
CHOLESTEROL: 94 mg/dL (ref 0–200)
HDL: 28 mg/dL — ABNORMAL LOW (ref >39)
LDL Cholesterol: 24 mg/dL (ref 0–99)
TRIGLYCERIDES: 208 mg/dL — AB (ref <150)
Total CHOL/HDL Ratio: 3.4 Ratio
VLDL: 42 mg/dL — ABNORMAL HIGH (ref 0–40)

## 2014-03-18 LAB — COMPLETE METABOLIC PANEL WITH GFR
ALBUMIN: 4 g/dL (ref 3.5–5.2)
ALK PHOS: 70 U/L (ref 39–117)
ALT: 17 U/L (ref 0–35)
AST: 20 U/L (ref 0–37)
BUN: 5 mg/dL — AB (ref 6–23)
CALCIUM: 9 mg/dL (ref 8.4–10.5)
CO2: 33 mEq/L — ABNORMAL HIGH (ref 19–32)
CREATININE: 0.55 mg/dL (ref 0.50–1.10)
Chloride: 98 mEq/L (ref 96–112)
GFR, Est African American: >89 mL/min
GLUCOSE: 111 mg/dL — AB (ref 70–99)
POTASSIUM: 3.8 meq/L (ref 3.5–5.3)
Sodium: 138 mEq/L (ref 135–145)
Total Bilirubin: 0.6 mg/dL (ref 0.2–1.2)
Total Protein: 6.1 g/dL (ref 6.0–8.3)

## 2014-03-18 LAB — GLUCOSE, CAPILLARY: Glucose-Capillary: 149 mg/dL — ABNORMAL HIGH (ref 70–99)

## 2014-03-18 MED ORDER — CARISOPRODOL 350 MG PO TABS: 350.0000 mg | ORAL_TABLET | Freq: Three times a day (TID) | ORAL | Status: DC | PRN

## 2014-03-18 MED ORDER — ACCU-CHEK COMPACT PLUS CARE KIT: PACK | Status: DC

## 2014-03-18 NOTE — Assessment & Plan Note (Signed)
BP Readings from Last 3 Encounters:  03/18/14 106/65  03/03/14 109/59  12/03/13 170/105    Lab Results  Component Value Date   NA 132* 11/26/2013   K 4.4 11/26/2013   CREATININE 0.50 11/26/2013    Assessment: Blood pressure control: controlled Progress toward BP goal:  at goal Comments: blood pressure is well controlled on enalapril 2.5 mg daily and furosemide 20 mg every other day  Plan: Medications:  continue current medications Educational resources provided: brochure Self management tools provided: home blood pressure logbook

## 2014-03-18 NOTE — Progress Notes (Signed)
   Subjective:    Patient ID: Mary Gray, female    DOB: August 27, 1961, 53 y.o.   MRN: [id number]  HPI Patient returns for management of her chronic low back pain with right sciatica, diabetes mellitus, hyperlipidemia, and other chronic problems.  She reports that she could not sleep last night and took her medications around 3 AM; she is somewhat somnolent in clinic today, and she attributes that to her lack of sleep.  She did not bring her medications to clinic, but reports no change in her medications since her last visit here.  She reports that she saw her neurosurgeon Dr. Lovell SheehanJenkins, and that she is undergoing physical therapy ordered by him.  She reports that her glucose meter was lost, and that she has not been recently checking her blood sugars.  She says that she is taking her medications as prescribed.   Review of Systems  Musculoskeletal: Positive for back pain.       Objective:   Physical Exam  Cardiovascular: Normal rate and regular rhythm.  Exam reveals friction rub. Exam reveals no gallop.   No murmur heard. Pulmonary/Chest: Effort normal and breath sounds normal. No respiratory distress. She has no wheezes. She has no rales.  Abdominal: Soft. Bowel sounds are normal. She exhibits no distension. There is no tenderness. There is no rebound and no guarding.  Neurological:  Patient is mildly somnolent, although she answers questions appropriately and is completely oriented       Assessment & Plan:

## 2014-03-18 NOTE — Assessment & Plan Note (Signed)
Lipids:    Component Value Date/Time   CHOL 115 03/05/2013 1017   TRIG 169* 03/05/2013 1017   HDL 37* 03/05/2013 1017   LDLCALC 44 03/05/2013 1017   VLDL 34 03/05/2013 1017   CHOLHDL 3.1 03/05/2013 1017    Assessment: Patient is doing well on rosuvastatin 10 mg daily, with no apparent adverse effects.  Plan: Continue rosuvastatin 10 mg daily; check a lipid panel today.

## 2014-03-18 NOTE — Assessment & Plan Note (Signed)
MRI LUMBAR SPINE WITHOUT CONTRAST 12/26/2013 IMPRESSION:  Moderate to large extruded disc fragment on the right at L2-3 with downgoing disc material causing impingement of the right L3 nerve root and moderate to severe spinal stenosis.   Moderate spinal stenosis L3-4, unchanged. Right paracentral disc protrusion L4-5, unchanged.  Marked foraminal encroachment bilaterally L5-S1 with impingement of the L5 nerve root bilaterally.    Assessment: Patient has chronic low back pain with right sciatica and is followed by neurosurgeon Dr. Lovell SheehanJenkins.  He obtained a followup MRI scan on 12/26/2013 with results as noted above.  Patient is currently undergoing physical therapy and will followup with Dr. Lovell SheehanJenkins regarding surgical intervention.  She is taking a substantial medication regimen for relief of symptoms including tramadol 50 mg every 6 hours as needed for pain, carisoprodol 350 mg 4 times a day as needed for muscle spasms, and hydromorphone 2 mg every 6 hours as needed for pain unrelieved by tramadol.  Her pain control seems reasonable on this regimen, but I am concerned about her relative sleepiness and I cautioned her about the potentially sedating effects of her medications  Plan: Continue tramadol 50 mg every 6 hours as needed for pain; reduce carisoprodol 350 mg to 3 times a day as needed for muscle spasms; continue hydromorphone 2 mg every 6 hours as needed for pain unrelieved by tramadol, but I cautioned patient to use this only if needed and to hold the medication if she is drowsy.  She is also on zolpidem (Ambien) prescribed by her psychiatrist at a dose of 10 mg once daily in the evening as needed for insomnia; I advised her to take one half tablet given her sleepiness.  I also advised her to keep her scheduled appointment with Dr. Lovell SheehanJenkins.

## 2014-03-18 NOTE — Patient Instructions (Signed)
1.  Reduce carisoprodol (Soma) to a dose of one tablet 3 times a day as needed for muscle spasms; do not take if drowsy. 2.  Reduce zolpidem (Ambien) 10 mg to a dose of one-half tablet at bedtime as needed for insomnia. 3.  Do not take hydromorphone (Dialudid) if you are drowsy.

## 2014-03-18 NOTE — Assessment & Plan Note (Addendum)
Lab Results  Component Value Date   HGBA1C 6.3 03/03/2014   HGBA1C 6.6 11/26/2013   HGBA1C 6.7 06/25/2013     Assessment: Diabetes control: good control (HgbA1C at goal) Progress toward A1C goal:  at goal Comments: Diabetes is well controlled on metformin XR 1000 mg each morning and 500 mg each evening  Plan: Medications:  continue current medications Home glucose monitoring: Frequency: once a day Timing: before breakfast Instruction/counseling given: reminded to get eye exam, reminded to bring blood glucose meter & log to each visit and reminded to bring medications to each visit Educational resources provided: brochure Self management tools provided: home glucose logbook Other plans: Check a comprehensive metabolic panel today

## 2014-03-24 ENCOUNTER — Other Ambulatory Visit: Payer: Self-pay | Admitting: Internal Medicine

## 2014-03-24 ENCOUNTER — Other Ambulatory Visit: Payer: Self-pay | Admitting: Podiatrist

## 2014-03-28 ENCOUNTER — Other Ambulatory Visit: Payer: Self-pay | Admitting: Internal Medicine

## 2014-03-30 NOTE — Telephone Encounter (Signed)
Due to potential interaction with tramadol, I would like to decrease the amitriptyline to a dose of 25 mg one tablet at bedtime for now, with a view toward eventually stopping the amitriptyline.  Please notify patient of this reduction in dose, and please advise her to call the clinic right away if she has any problems with the reduction in dose.

## 2014-04-08 ENCOUNTER — Other Ambulatory Visit: Payer: Self-pay | Admitting: Internal Medicine

## 2014-04-14 ENCOUNTER — Other Ambulatory Visit: Payer: Self-pay | Admitting: *Deleted

## 2014-04-14 NOTE — Telephone Encounter (Signed)
Last refilled on 4/24 per pharmacy Pt # [phone]

## 2014-04-15 ENCOUNTER — Ambulatory Visit: Payer: Commercial Managed Care - HMO | Admitting: Internal Medicine

## 2014-04-15 MED ORDER — HYDROMORPHONE HCL 2 MG PO TABS: 2.0000 mg | ORAL_TABLET | Freq: Four times a day (QID) | ORAL | Status: DC | PRN

## 2014-04-15 NOTE — Telephone Encounter (Signed)
Pt informed Rx is ready 

## 2014-04-15 NOTE — Telephone Encounter (Signed)
Refill printed and signed - nurse to complete. 

## 2014-04-16 ENCOUNTER — Other Ambulatory Visit: Payer: Self-pay | Admitting: Podiatrist

## 2014-04-17 NOTE — Telephone Encounter (Signed)
Pt was instructed by Dr. Irving Shows to return in 3 months.

## 2014-04-30 ENCOUNTER — Other Ambulatory Visit: Payer: Self-pay | Admitting: Internal Medicine

## 2014-04-30 NOTE — Telephone Encounter (Signed)
Last CBC and ferritin in January of this year were normal.  Patient can stop taking the iron supplementation.

## 2014-04-30 NOTE — Telephone Encounter (Signed)
According to the patient's medication list, she is on Symbicort rather than fluticasone (Flovent).  However, it appears that she has been recently refilling her Flovent.  Please call patient and find out which of these inhalers she is using; she should not use both.

## 2014-04-30 NOTE — Telephone Encounter (Signed)
Pt called/informed to iron tabs are net needed per Dr Meredith Pel.

## 2014-04-30 NOTE — Telephone Encounter (Signed)
Pt states she on Symbicort and ProAir not Flovent.

## 2014-05-08 ENCOUNTER — Other Ambulatory Visit: Payer: Self-pay | Admitting: Internal Medicine

## 2014-05-08 MED ORDER — GLUCOSE BLOOD VI STRP: ORAL_STRIP | Status: DC

## 2014-05-13 ENCOUNTER — Other Ambulatory Visit: Payer: Self-pay | Admitting: *Deleted

## 2014-05-14 ENCOUNTER — Telehealth: Payer: Self-pay | Admitting: *Deleted

## 2014-05-14 MED ORDER — HYDROMORPHONE HCL 2 MG PO TABS: 2.0000 mg | ORAL_TABLET | Freq: Four times a day (QID) | ORAL | Status: DC | PRN

## 2014-05-14 NOTE — Telephone Encounter (Signed)
Refill printed and signed and provided to refill nurse. 

## 2014-05-14 NOTE — Telephone Encounter (Signed)
Pharmacy is requesting refill on Fluconazole 150mg .  Last filled 02-13-14.  Please advise.

## 2014-05-14 NOTE — Telephone Encounter (Signed)
Pt aware of Rx.

## 2014-05-15 ENCOUNTER — Other Ambulatory Visit: Payer: Self-pay | Admitting: Internal Medicine

## 2014-05-15 NOTE — Telephone Encounter (Signed)
OK for refill.

## 2014-05-20 ENCOUNTER — Telehealth: Payer: Self-pay | Admitting: *Deleted

## 2014-05-20 ENCOUNTER — Encounter: Payer: Self-pay | Admitting: Internal Medicine

## 2014-05-20 ENCOUNTER — Ambulatory Visit (INDEPENDENT_AMBULATORY_CARE_PROVIDER_SITE_OTHER): Payer: Commercial Managed Care - HMO | Admitting: Internal Medicine

## 2014-05-20 VITALS — BP 120/80 | HR 79 | Temp 97.1°F | Wt 199.7 lb

## 2014-05-20 DIAGNOSIS — M5440 Lumbago with sciatica, unspecified side: Secondary | ICD-10-CM

## 2014-05-20 DIAGNOSIS — M5430 Sciatica, unspecified side: Secondary | ICD-10-CM

## 2014-05-20 DIAGNOSIS — I10 Essential (primary) hypertension: Secondary | ICD-10-CM

## 2014-05-20 DIAGNOSIS — E119 Type 2 diabetes mellitus without complications: Secondary | ICD-10-CM

## 2014-05-20 DIAGNOSIS — E669 Obesity, unspecified: Secondary | ICD-10-CM

## 2014-05-20 LAB — POCT GLYCOSYLATED HEMOGLOBIN (HGB A1C): Hemoglobin A1C: 6.1

## 2014-05-20 LAB — GLUCOSE, CAPILLARY: GLUCOSE-CAPILLARY: 123 mg/dL — AB (ref 70–99)

## 2014-05-20 MED ORDER — GLUCOSE BLOOD VI STRP: ORAL_STRIP | Status: DC

## 2014-05-20 NOTE — Patient Instructions (Signed)
General Instructions: Continue current medications. Schedule an appointment with your ophthalmologist Dr. Dione BoozeGroat for annual eye exam. Schedule an appointment with your gynecologist Dr. Clearance CootsHarper for pelvic exam and Pap smear. Schedule an appointment for your annual mammogram. Follow up with your neurosurgeon Dr. Lovell SheehanJenkins as scheduled.    Progress Toward Treatment Goals:  Treatment Goal 05/20/2014  Hemoglobin A1C at goal  Blood pressure at goal  Prevent falls -    Self Care Goals & Plans:  Self Care Goal 05/20/2014  Manage my medications -  Monitor my health -  Eat healthy foods -  Be physically active -  Prevent falls -  Meeting treatment goals maintain the current self-care plan    Home Blood Glucose Monitoring 05/20/2014  Check my blood sugar once a day  When to check my blood sugar before breakfast     Care Management & Community Referrals:  Referral 05/20/2014  Referrals made for care management support none needed  Referrals made to community resources none

## 2014-05-20 NOTE — Telephone Encounter (Signed)
Pt called in for refill of test strips. Refill was done on 6/19 for test strips, request was received from [facility]. Rx was printed so I don't think [facility] ever received order.  Please resend.

## 2014-05-20 NOTE — Assessment & Plan Note (Signed)
Assessment: Patient has chronic low back pain with right sciatica and is followed by neurosurgeon Dr. Lovell SheehanJenkins.  She is currently participating in physical therapy prescribed by Dr. Lovell SheehanJenkins.  Her pain is reasonably well-controlled on tramadol 50 mg every 6 hours as needed for pain and hydromorphone 2 mg every 6 hours as needed for pain, as well as carisoprodol (soma) 300 mg 3 times daily as needed  Plan: Continue current medications; patient will follow up with Dr. Lovell SheehanJenkins as scheduled.

## 2014-05-20 NOTE — Assessment & Plan Note (Signed)
Lab Results  Component Value Date   HGBA1C 6.1 05/20/2014   HGBA1C 6.3 03/03/2014   HGBA1C 6.6 11/26/2013     Assessment: Diabetes control: good control (HgbA1C at goal) Progress toward A1C goal:  at goal Comments: diabetes is well controlled on metformin XR 500 mg twice a day  Plan: Medications:  continue current medications Home glucose monitoring: Frequency: once a day Timing: before breakfast Instruction/counseling given: reminded to get eye exam, reminded to bring blood glucose meter & log to each visit and reminded to bring medications to each visit Educational resources provided: handout

## 2014-05-20 NOTE — Progress Notes (Addendum)
   Subjective:    Patient ID: Hardin NegusSara Alwine, female    DOB: 18-Apr-1961, 53 y.o.   MRN: [id number]  HPI Patient returns for management and followup of her diabetes mellitus, obesity, hypertension, chronic back pain, and other problems.  She has continued to diet and lose weight.  She is receiving physical therapy prescribed by her neurosurgeon Dr. Lovell SheehanJenkins, and reports that her back pain is doing reasonably well on her current regimen of hydromorphone and tramadol.  She reduced her metformin dose to 500 mg twice a day and reports no problems on that dose.  She did not bring her medications to clinic, but reports that she is compliant with her medications.   Review of Systems  Constitutional: Negative for fever and chills.  Respiratory: Negative for chest tightness, shortness of breath and wheezing.   Cardiovascular: Negative for chest pain and leg swelling.  Gastrointestinal: Negative for nausea, vomiting and abdominal pain.  Musculoskeletal: Positive for back pain.       Objective:   Physical Exam  Constitutional: No distress.  Cardiovascular: Normal rate, regular rhythm and normal heart sounds.  Exam reveals no gallop and no friction rub.   No murmur heard. Pulmonary/Chest: Effort normal and breath sounds normal. No respiratory distress. She has no wheezes. She has no rales.  Abdominal: Soft. Bowel sounds are normal. She exhibits no distension. There is no tenderness. There is no rebound and no guarding.  Musculoskeletal: She exhibits no edema.          Assessment & Plan:

## 2014-05-20 NOTE — Assessment & Plan Note (Signed)
Wt Readings from Last 5 Encounters:  05/20/14 199 lb 11.2 oz (90.583 kg)  03/18/14 205 lb 1.6 oz (93.033 kg)  03/03/14 211 lb 9.6 oz (95.981 kg)  11/26/13 236 lb (107.049 kg)  06/25/13 225 lb 3.2 oz (102.15 kg)    Assessment: Patient has been intentionally dieting, and has lost 37 pounds since January of this year.  She feels better and is pleased with her weight loss to date.  Plan: I discussed the benefits and excellent success she has had to date, and encouraged her to continue with her weight loss plan.

## 2014-05-25 ENCOUNTER — Other Ambulatory Visit: Payer: Self-pay | Admitting: Internal Medicine

## 2014-05-25 ENCOUNTER — Other Ambulatory Visit: Payer: Self-pay | Admitting: *Deleted

## 2014-05-25 NOTE — Telephone Encounter (Signed)
In attempt to refill Diflucan, high contraindication alert was given for concurrent use when taking EES for rosacea.    Please advise to continue using Diflucan.

## 2014-05-26 ENCOUNTER — Other Ambulatory Visit: Payer: Self-pay | Admitting: Internal Medicine

## 2014-05-27 ENCOUNTER — Telehealth: Payer: Self-pay | Admitting: *Deleted

## 2014-05-27 NOTE — Telephone Encounter (Signed)
Patient is now on Symbicort, and should not be using Flovent as well.  The Flovent was stopped last year.  Please advise her not to use both medications.

## 2014-05-27 NOTE — Telephone Encounter (Signed)
[facility] is requesting refill on Flovent 50mcg diskus - last refill 04/29/14. Rx was written 05/03/13. Stanton KidneyDebra Celena Lanius RN 05/27/14 11:30AM

## 2014-05-29 ENCOUNTER — Other Ambulatory Visit: Payer: Self-pay | Admitting: Internal Medicine

## 2014-05-29 ENCOUNTER — Other Ambulatory Visit: Payer: Self-pay | Admitting: *Deleted

## 2014-05-29 DIAGNOSIS — M549 Dorsalgia, unspecified: Principal | ICD-10-CM

## 2014-05-29 DIAGNOSIS — G8929 Other chronic pain: Secondary | ICD-10-CM

## 2014-05-29 MED ORDER — CARISOPRODOL 350 MG PO TABS: 350.0000 mg | ORAL_TABLET | Freq: Three times a day (TID) | ORAL | Status: DC | PRN

## 2014-05-29 MED ORDER — TRAMADOL HCL 50 MG PO TABS: 50.0000 mg | ORAL_TABLET | Freq: Four times a day (QID) | ORAL | Status: DC | PRN

## 2014-05-29 NOTE — Telephone Encounter (Signed)
Refills approved - nurse to call in. 

## 2014-05-29 NOTE — Telephone Encounter (Signed)
Called in.

## 2014-05-29 NOTE — Telephone Encounter (Signed)
Talked with sister and informed  Flovent was d/c last year. Should not use Flovent and  Symbicort at same time per Dr Meredith PelJoines.

## 2014-06-12 ENCOUNTER — Other Ambulatory Visit: Payer: Self-pay | Admitting: *Deleted

## 2014-06-12 ENCOUNTER — Other Ambulatory Visit: Payer: Self-pay | Admitting: Podiatrist

## 2014-06-12 MED ORDER — HYDROMORPHONE HCL 2 MG PO TABS
2.0000 mg | ORAL_TABLET | Freq: Four times a day (QID) | ORAL | Status: DC | PRN
Start: 2014-06-12 — End: 2014-07-15

## 2014-06-12 NOTE — Telephone Encounter (Signed)
Last refill 6/25 Call when ready @ [phone]

## 2014-06-12 NOTE — Telephone Encounter (Signed)
Refill printed and signed and provided to refill nurse. 

## 2014-06-12 NOTE — Telephone Encounter (Signed)
Pt informed Rx is ready 

## 2014-06-14 ENCOUNTER — Other Ambulatory Visit: Payer: Self-pay | Admitting: Podiatrist

## 2014-06-14 IMAGING — CR DG SHOULDER 2+V*R*
3 series · 3 of 3 positions shown · non-contrast
Comparison: None.

CLINICAL DATA: Fall from wheelchair.  Right-sided pain.

RIGHT SHOULDER - 2+ VIEW

[t shoulder internal right (1 of 2)]
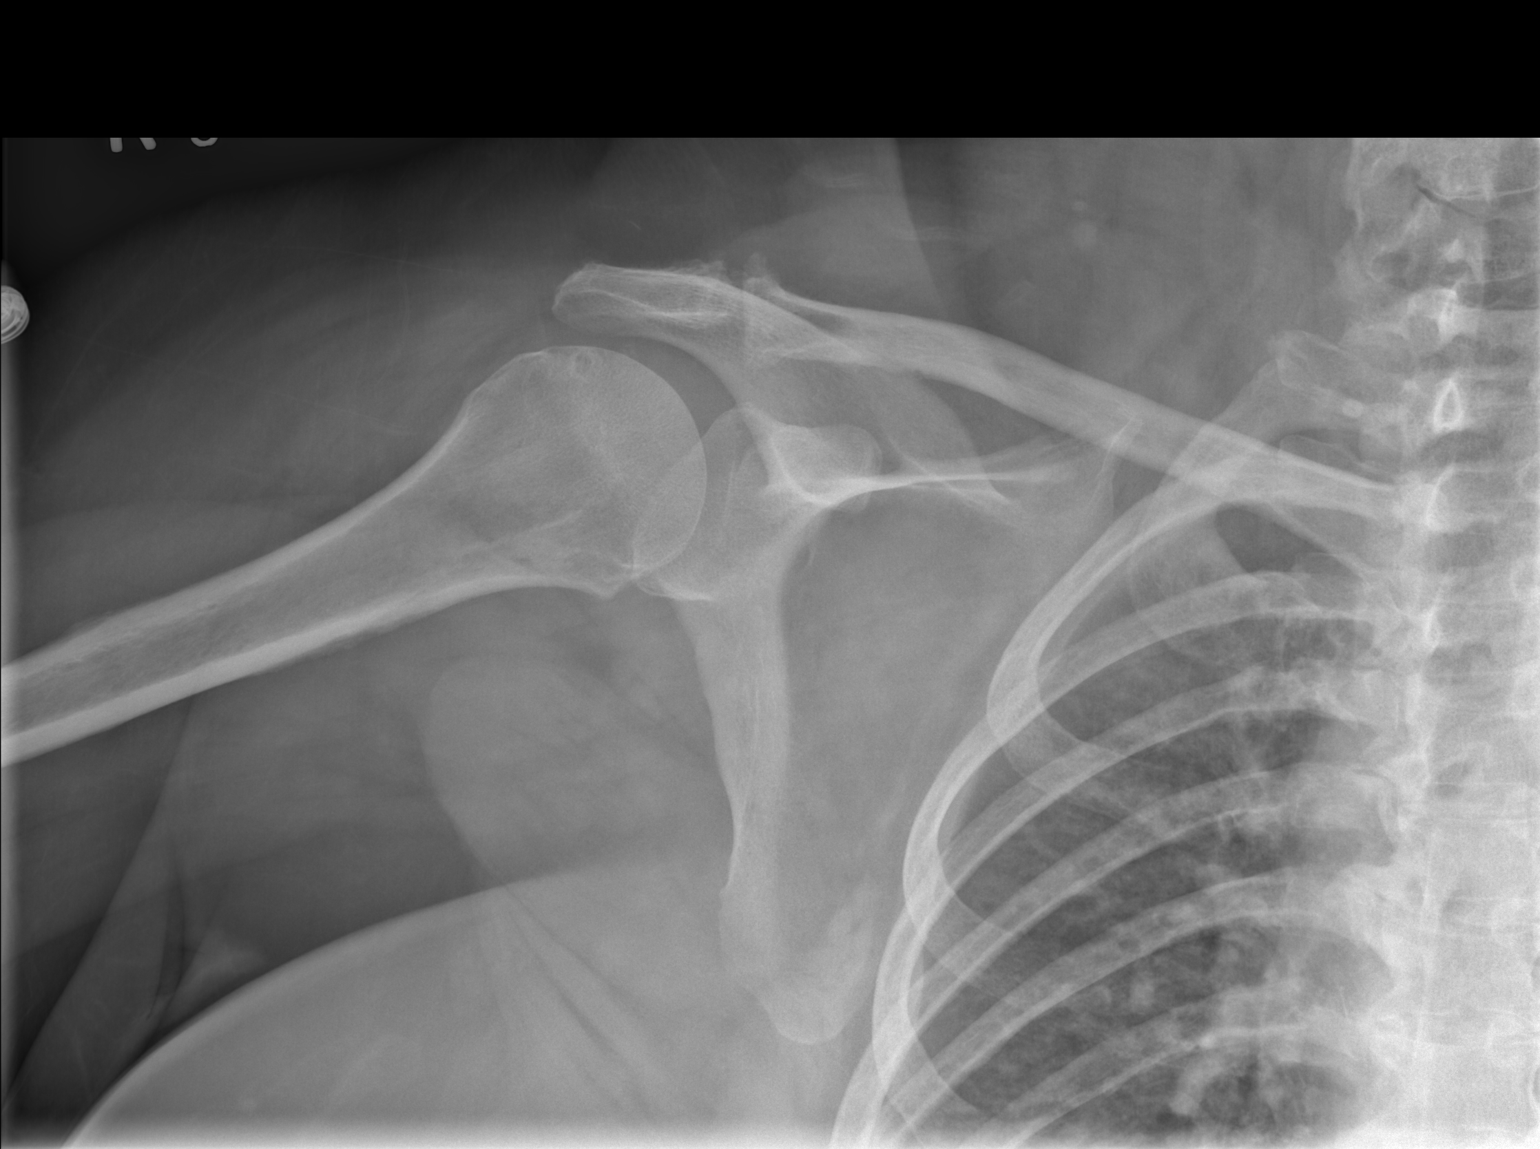

[t shoulder internal right (2 of 2)]
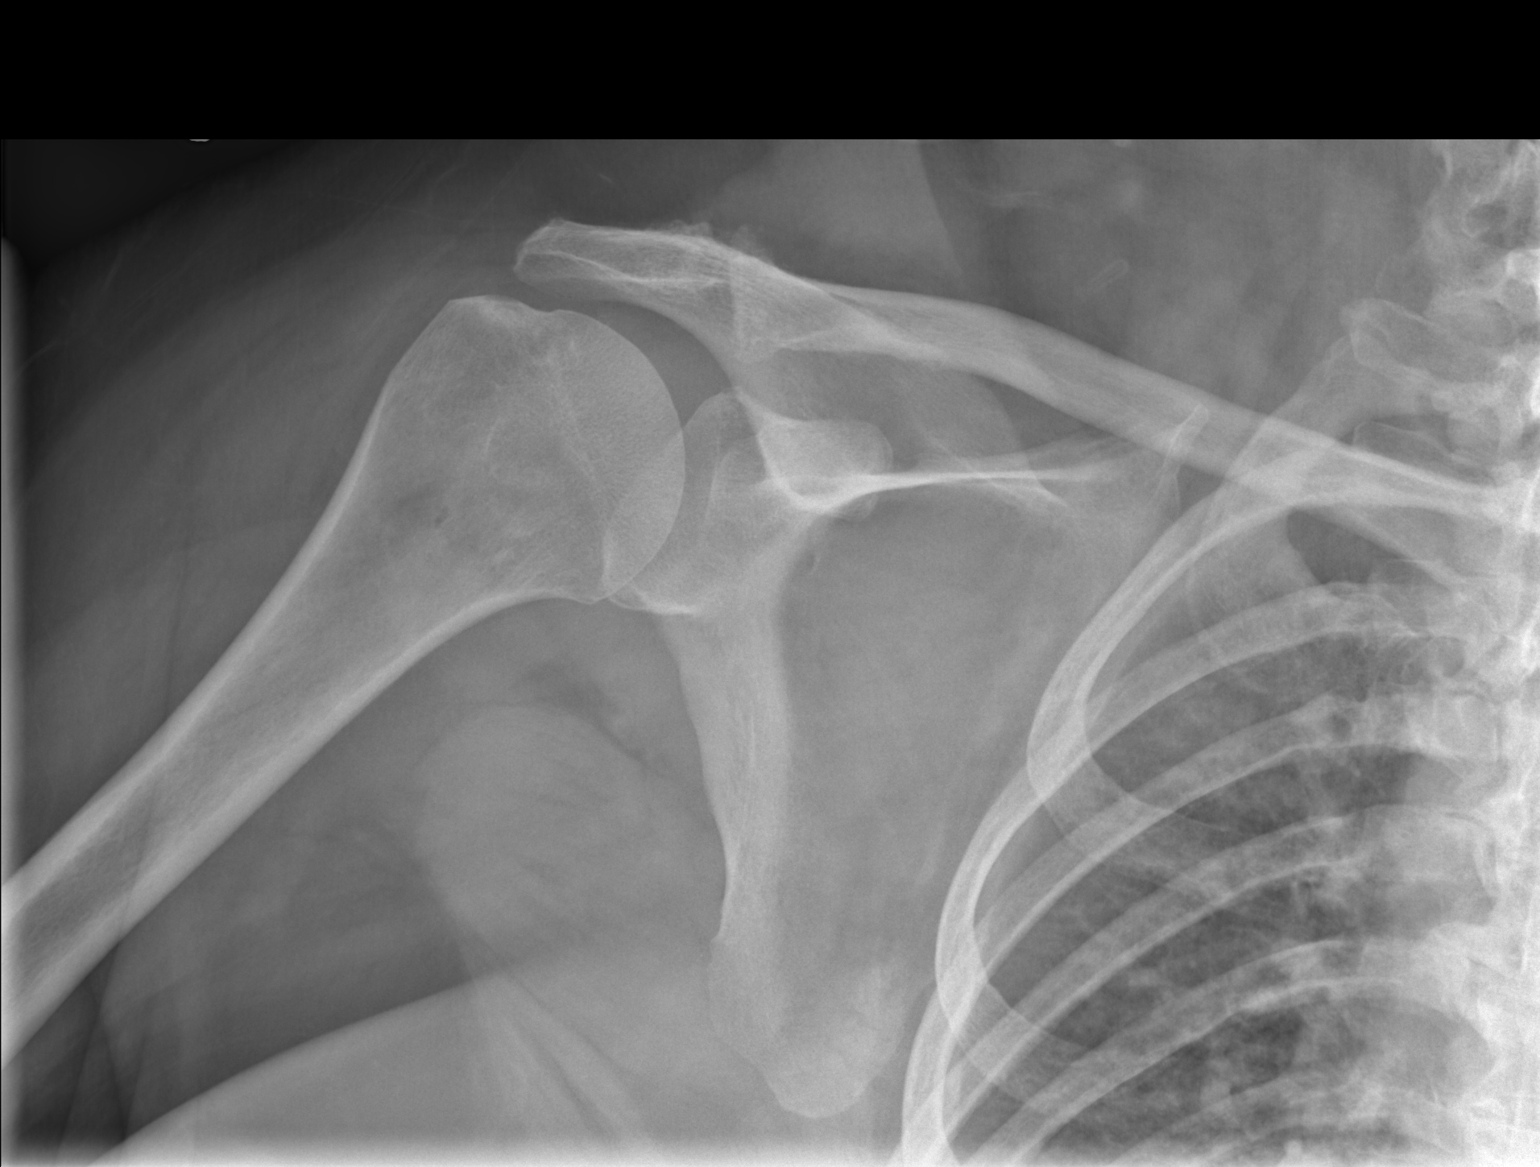

[t shoulder y-view right]
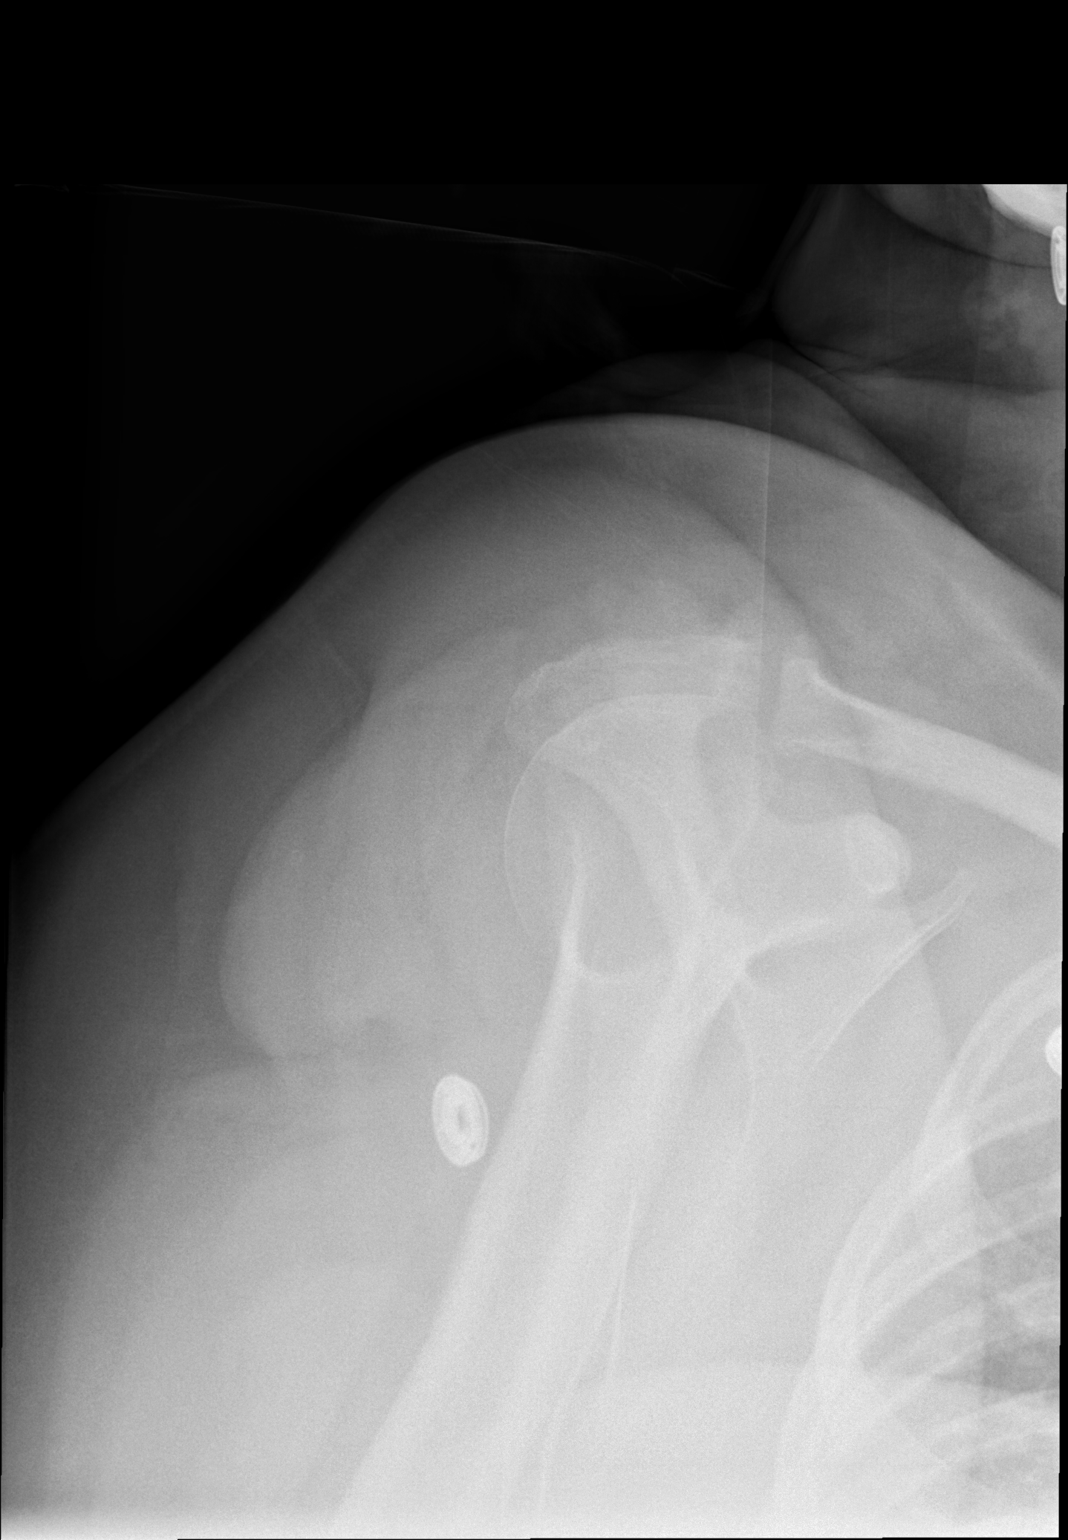

[3 of 3 positions shown; findings below may reference images not displayed]

FINDINGS: Scapular Y view is suboptimal due to obliquity.  There is
no fracture.  Grossly, shoulder appears located.  Moderate to
severe AC joint osteoarthritis is present with undersurface
spurring.  Scapula and visualized right chest appears within normal
limits.  Visualized proximal humerus appears normal.
IMPRESSION: No acute osseous abnormality.  Technically suboptimal scapular Y
view.

## 2014-06-14 IMAGING — CR DG CERVICAL SPINE COMPLETE 4+V
6 series · 6 of 6 positions shown · non-contrast
Comparison: None.

CLINICAL DATA: Fall from wheelchair.  Back pain.

CERVICAL SPINE - COMPLETE 4+ VIEW

[w cervical spine lat]
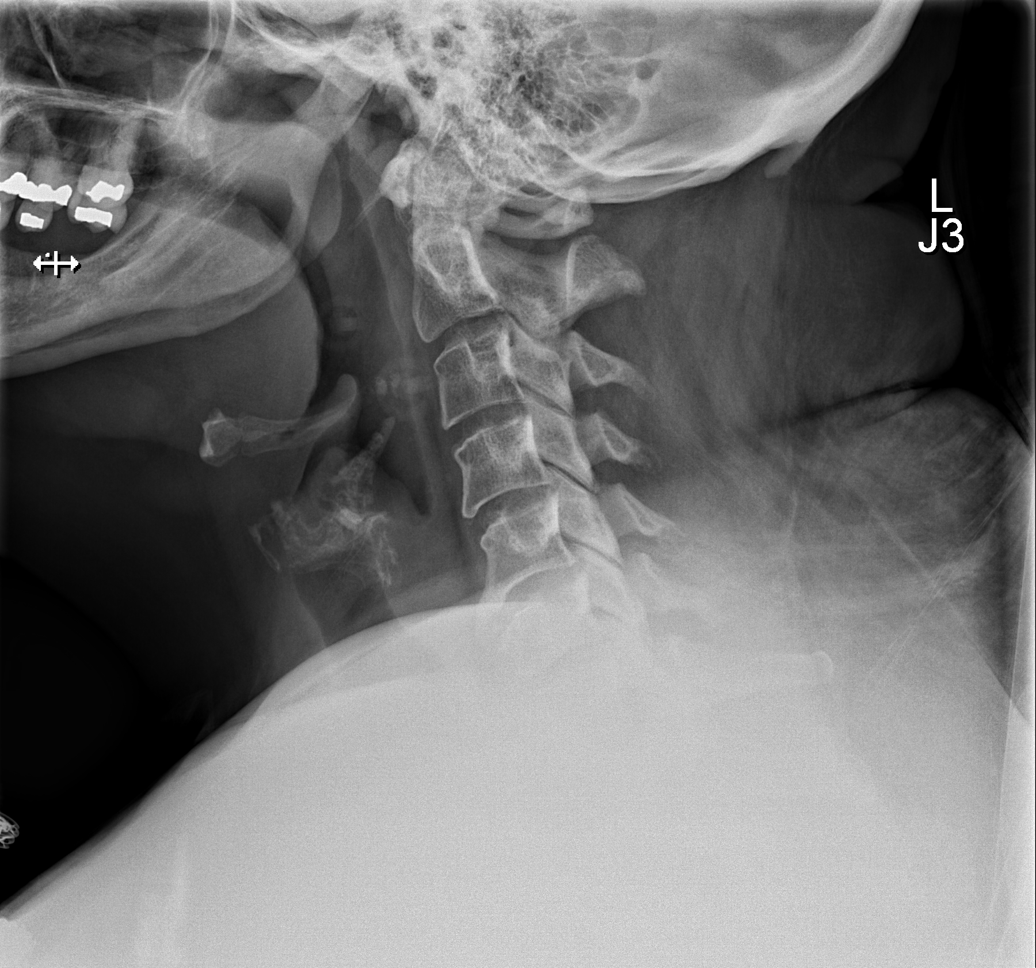

[w cervical swimmers]
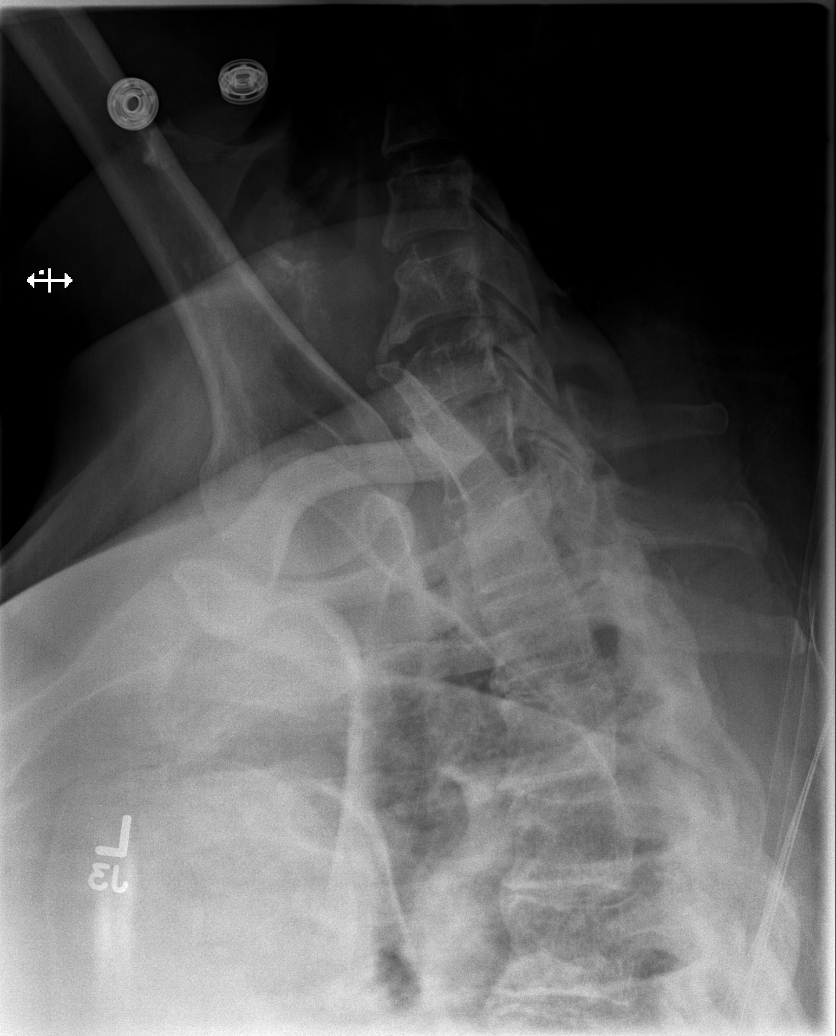

[t cervical spine ap]
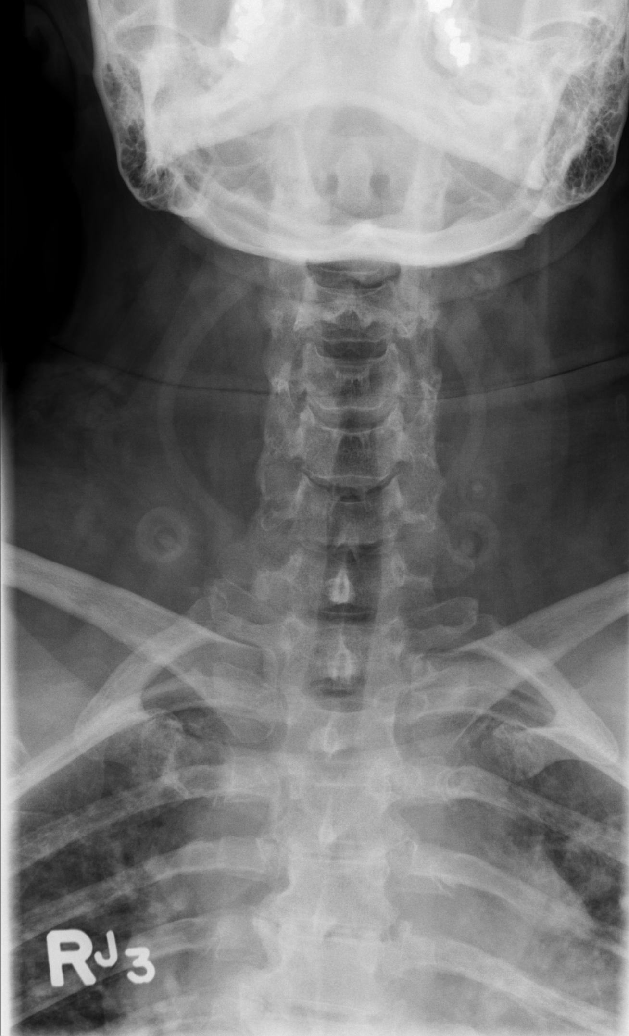

[t cervical spine odontoid]
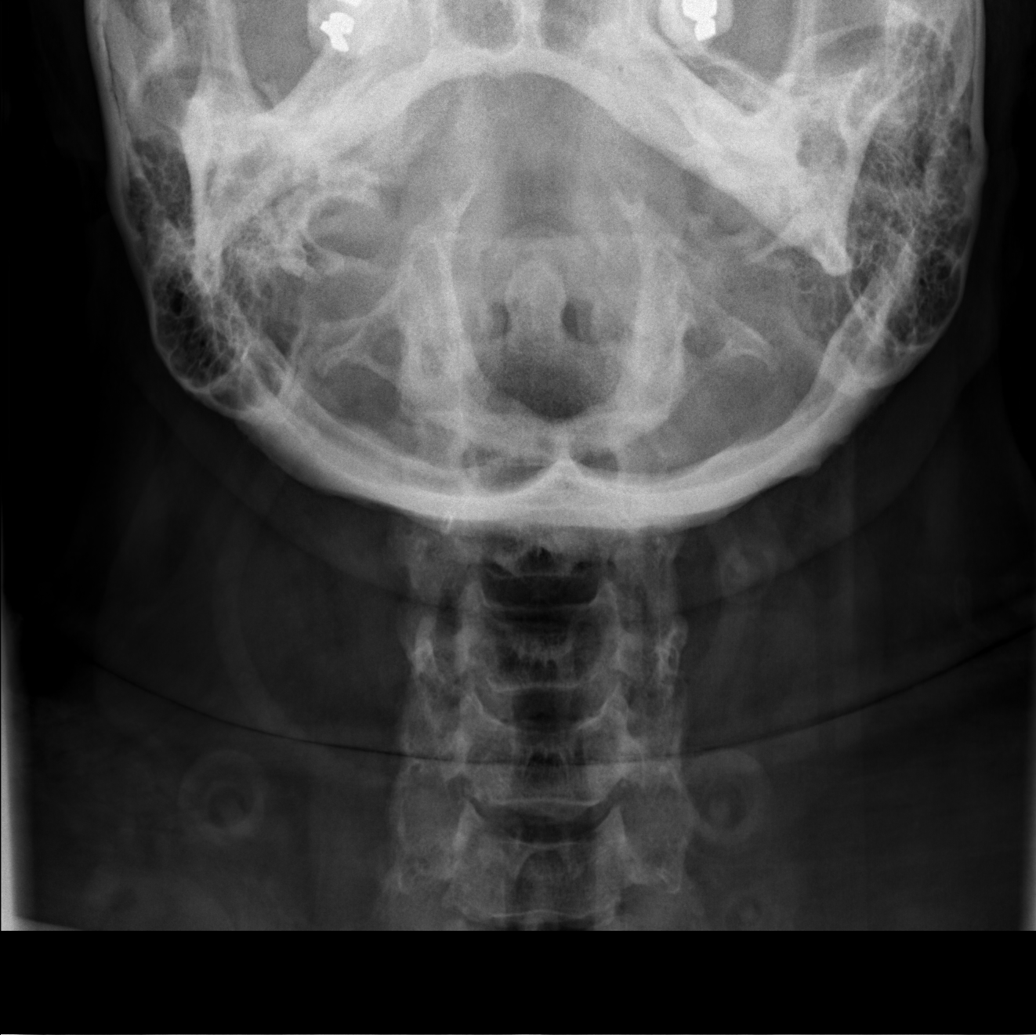

[t cervical spine obl (1 of 2)]
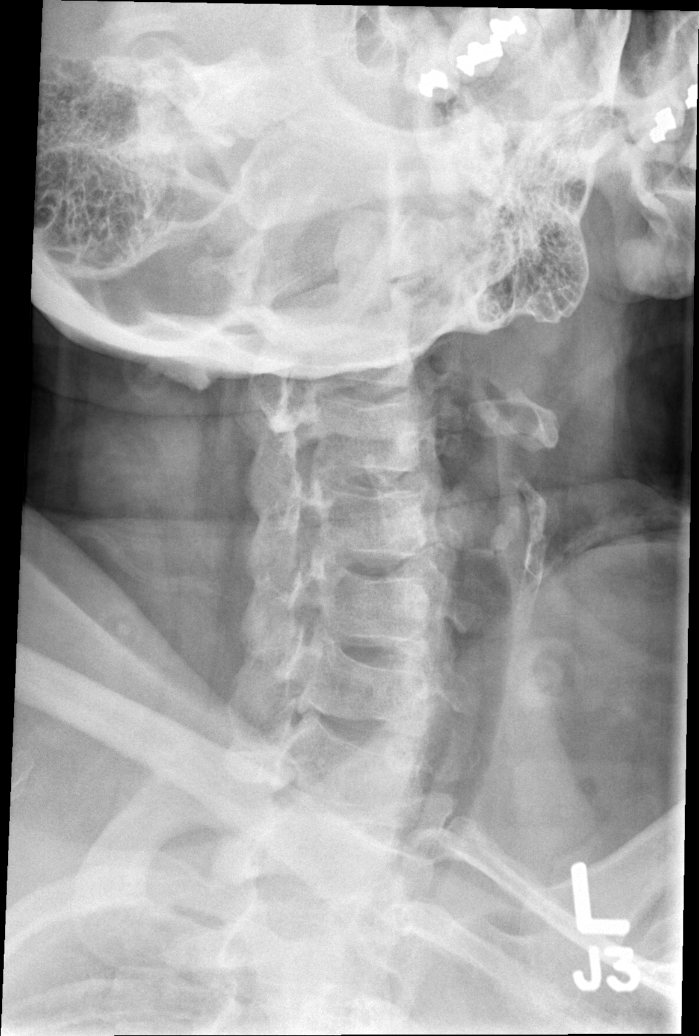

[t cervical spine obl (2 of 2)]
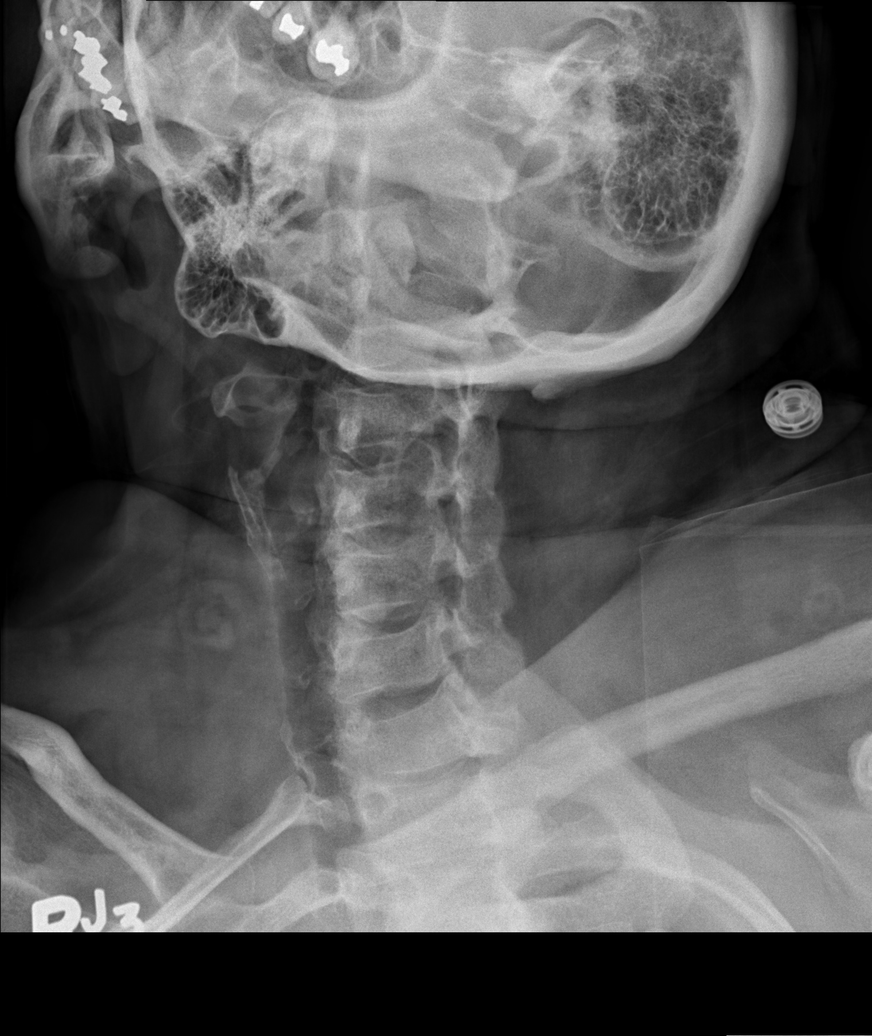

[6 of 6 positions shown; findings below may reference images not displayed]

FINDINGS: The five views study obtained with the patient in a
cervical collar.  No evidence for acute fracture.  No subluxation.
Loss of disc height is seen at C6-7.  Facets are well-aligned
bilaterally.  No evidence for prevertebral soft tissue swelling.
Straightening of the normal cervical lordosis is evident.
IMPRESSION: No evidence for cervical spine fracture.

Loss of cervical lordosis.  This can be related to patient
positioning, muscle spasm or soft tissue injury.

## 2014-06-17 ENCOUNTER — Telehealth: Payer: Self-pay | Admitting: *Deleted

## 2014-06-17 NOTE — Telephone Encounter (Signed)
I called and informed the patient that she will have to come by the office to pick up her refill on Lyrica 75 mg that Dr. Dimas AguasEgerton okayed.  It can't be called into the pharmacy because it's considered a controlled substance.  She asked what time do we close.  I told her 5pm.  She said okay thank you.

## 2014-06-23 ENCOUNTER — Other Ambulatory Visit: Payer: Self-pay | Admitting: Internal Medicine

## 2014-07-04 ENCOUNTER — Other Ambulatory Visit: Payer: Self-pay | Admitting: Internal Medicine

## 2014-07-04 ENCOUNTER — Other Ambulatory Visit: Payer: Self-pay | Admitting: Podiatrist

## 2014-07-06 NOTE — Telephone Encounter (Signed)
No response.  Call to be closed.

## 2014-07-06 NOTE — Telephone Encounter (Signed)
Refills approved - nurse to call in the tramadol refill. 

## 2014-07-07 NOTE — Telephone Encounter (Signed)
Called tramadol to pharm 

## 2014-07-08 ENCOUNTER — Other Ambulatory Visit: Payer: Self-pay | Admitting: Internal Medicine

## 2014-07-10 ENCOUNTER — Other Ambulatory Visit: Payer: Self-pay | Admitting: Podiatrist

## 2014-07-10 ENCOUNTER — Telehealth: Payer: Self-pay | Admitting: *Deleted

## 2014-07-10 MED ORDER — ROPINIROLE HCL 4 MG PO TABS: ORAL_TABLET | ORAL | Status: DC

## 2014-07-10 NOTE — Telephone Encounter (Signed)
I'm calling about my Requip.  It's the weekend and I'm out of prescription refills.  I called earlier this morning.  I'm calling now.  I called the pharmacy and they said I needed to call you.  Can you call in a refill for my Requip 4mg ?  Please give me a call.

## 2014-07-10 NOTE — Telephone Encounter (Signed)
rx was written-- called into pharmacy with 4 refills today

## 2014-07-10 NOTE — Telephone Encounter (Signed)
I called and informed her that Dr. Irving ShowsEgerton sent the prescription for Requip to the pharmacy.  I told her she would have to come by the office on Monday to pick up the Lyrica prescription.  She stated she has already picked up the Lyrica today.  She said tell her thank you.

## 2014-07-10 NOTE — Telephone Encounter (Signed)
I was told I need to come by to pick my prescription up for my Lyrica.

## 2014-07-15 ENCOUNTER — Other Ambulatory Visit: Payer: Self-pay | Admitting: *Deleted

## 2014-07-16 MED ORDER — HYDROMORPHONE HCL 2 MG PO TABS
2.0000 mg | ORAL_TABLET | Freq: Four times a day (QID) | ORAL | Status: DC | PRN
Start: 2014-07-16 — End: 2014-08-12

## 2014-07-16 NOTE — Telephone Encounter (Signed)
Refill printed and signed and provided to refill nurse. 

## 2014-07-16 NOTE — Telephone Encounter (Signed)
Tried to call, unable to lm

## 2014-08-05 ENCOUNTER — Other Ambulatory Visit: Payer: Self-pay | Admitting: Internal Medicine

## 2014-08-05 ENCOUNTER — Ambulatory Visit: Payer: Commercial Managed Care - HMO | Admitting: Internal Medicine

## 2014-08-12 ENCOUNTER — Other Ambulatory Visit: Payer: Self-pay | Admitting: *Deleted

## 2014-08-12 MED ORDER — HYDROMORPHONE HCL 2 MG PO TABS: 2.0000 mg | ORAL_TABLET | Freq: Four times a day (QID) | ORAL | Status: DC | PRN

## 2014-08-27 ENCOUNTER — Other Ambulatory Visit: Payer: Self-pay | Admitting: Internal Medicine

## 2014-09-01 ENCOUNTER — Other Ambulatory Visit: Payer: Self-pay | Admitting: Internal Medicine

## 2014-09-10 ENCOUNTER — Other Ambulatory Visit: Payer: Self-pay | Admitting: *Deleted

## 2014-09-10 MED ORDER — HYDROMORPHONE HCL 2 MG PO TABS: 2.0000 mg | ORAL_TABLET | Freq: Four times a day (QID) | ORAL | Status: DC | PRN

## 2014-09-10 NOTE — Telephone Encounter (Signed)
Refill printed and signed and provided to refill nurse. 

## 2014-09-10 NOTE — Telephone Encounter (Signed)
Rx ready to bee picked up - pt called; message left.

## 2014-09-15 ENCOUNTER — Emergency Department (HOSPITAL_COMMUNITY)
Admission: EM | Admit: 2014-09-15 | Discharge: 2014-09-15 | Disposition: A | Discharge status: Home / Self Care | Payer: Medicare HMO | Attending: Emergency Medicine | Admitting: Emergency Medicine

## 2014-09-15 ENCOUNTER — Encounter (HOSPITAL_COMMUNITY): Payer: Self-pay | Admitting: Emergency Medicine

## 2014-09-15 ENCOUNTER — Emergency Department (HOSPITAL_COMMUNITY): Payer: Medicare HMO

## 2014-09-15 DIAGNOSIS — W01198A Fall on same level from slipping, tripping and stumbling with subsequent striking against other object, initial encounter: Secondary | ICD-10-CM | POA: Diagnosis not present

## 2014-09-15 DIAGNOSIS — E119 Type 2 diabetes mellitus without complications: Secondary | ICD-10-CM | POA: Insufficient documentation

## 2014-09-15 DIAGNOSIS — W19XXXA Unspecified fall, initial encounter: Secondary | ICD-10-CM

## 2014-09-15 DIAGNOSIS — S4991XA Unspecified injury of right shoulder and upper arm, initial encounter: Secondary | ICD-10-CM | POA: Insufficient documentation

## 2014-09-15 DIAGNOSIS — S3992XA Unspecified injury of lower back, initial encounter: Secondary | ICD-10-CM | POA: Diagnosis present

## 2014-09-15 DIAGNOSIS — Y92029 Unspecified place in mobile home as the place of occurrence of the external cause: Secondary | ICD-10-CM | POA: Diagnosis not present

## 2014-09-15 DIAGNOSIS — Z872 Personal history of diseases of the skin and subcutaneous tissue: Secondary | ICD-10-CM | POA: Insufficient documentation

## 2014-09-15 DIAGNOSIS — Z8701 Personal history of pneumonia (recurrent): Secondary | ICD-10-CM | POA: Insufficient documentation

## 2014-09-15 DIAGNOSIS — Z8669 Personal history of other diseases of the nervous system and sense organs: Secondary | ICD-10-CM | POA: Insufficient documentation

## 2014-09-15 DIAGNOSIS — E669 Obesity, unspecified: Secondary | ICD-10-CM | POA: Diagnosis not present

## 2014-09-15 DIAGNOSIS — K219 Gastro-esophageal reflux disease without esophagitis: Secondary | ICD-10-CM | POA: Insufficient documentation

## 2014-09-15 DIAGNOSIS — Z87891 Personal history of nicotine dependence: Secondary | ICD-10-CM | POA: Diagnosis not present

## 2014-09-15 DIAGNOSIS — M545 Low back pain: Secondary | ICD-10-CM

## 2014-09-15 DIAGNOSIS — F329 Major depressive disorder, single episode, unspecified: Secondary | ICD-10-CM | POA: Insufficient documentation

## 2014-09-15 DIAGNOSIS — Z8739 Personal history of other diseases of the musculoskeletal system and connective tissue: Secondary | ICD-10-CM | POA: Diagnosis not present

## 2014-09-15 DIAGNOSIS — Z792 Long term (current) use of antibiotics: Secondary | ICD-10-CM | POA: Diagnosis not present

## 2014-09-15 DIAGNOSIS — I10 Essential (primary) hypertension: Secondary | ICD-10-CM | POA: Diagnosis not present

## 2014-09-15 DIAGNOSIS — J449 Chronic obstructive pulmonary disease, unspecified: Secondary | ICD-10-CM | POA: Insufficient documentation

## 2014-09-15 DIAGNOSIS — Z8742 Personal history of other diseases of the female genital tract: Secondary | ICD-10-CM | POA: Diagnosis not present

## 2014-09-15 DIAGNOSIS — Z87448 Personal history of other diseases of urinary system: Secondary | ICD-10-CM | POA: Insufficient documentation

## 2014-09-15 DIAGNOSIS — S299XXA Unspecified injury of thorax, initial encounter: Secondary | ICD-10-CM | POA: Diagnosis not present

## 2014-09-15 DIAGNOSIS — E780 Pure hypercholesterolemia: Secondary | ICD-10-CM | POA: Insufficient documentation

## 2014-09-15 DIAGNOSIS — G8929 Other chronic pain: Secondary | ICD-10-CM

## 2014-09-15 DIAGNOSIS — Z862 Personal history of diseases of the blood and blood-forming organs and certain disorders involving the immune mechanism: Secondary | ICD-10-CM | POA: Diagnosis not present

## 2014-09-15 DIAGNOSIS — Z880 Allergy status to penicillin: Secondary | ICD-10-CM | POA: Diagnosis not present

## 2014-09-15 DIAGNOSIS — Z7951 Long term (current) use of inhaled steroids: Secondary | ICD-10-CM | POA: Insufficient documentation

## 2014-09-15 DIAGNOSIS — Z79899 Other long term (current) drug therapy: Secondary | ICD-10-CM | POA: Insufficient documentation

## 2014-09-15 DIAGNOSIS — F419 Anxiety disorder, unspecified: Secondary | ICD-10-CM | POA: Insufficient documentation

## 2014-09-15 DIAGNOSIS — E039 Hypothyroidism, unspecified: Secondary | ICD-10-CM | POA: Diagnosis not present

## 2014-09-15 DIAGNOSIS — Y9389 Activity, other specified: Secondary | ICD-10-CM | POA: Diagnosis not present

## 2014-09-15 NOTE — ED Notes (Signed)
Patient states incontinent of urine since yesterday.  Acuity changed to 3.

## 2014-09-15 NOTE — ED Provider Notes (Signed)
CSN: [id number]     Arrival date & time 09/15/14  1102 History   First MD Initiated Contact with Patient 09/15/14 1120     Chief Complaint  Patient presents with  . Back Pain     (Consider location/radiation/quality/duration/timing/severity/associated sxs/prior Treatment) HPI Mary Gray is a 53 y.o. female with extensive past medical history and chronic back pain comes in for evaluation for acute exacerbation of back pain. Patient states last night she was trying to break up a fight between her dogs when the cord or leash became wrapped around her legs and she tripped and fell on her side up against her mobile home. She reports hitting her right shoulder and side against the side of the house and fell to the ground. She reports intense, diffuse back pain all over but is worse than normal in her low back and ribs. She denies taking any medications today, "not even Aleve because Dr. Arnoldo Morale told me not to take it and to come straight to the ED". She was immediately able to ambulate independently. Denies loss of consciousness, nausea or vomiting, numbness or weakness. Patient does complain of urinating on herself, but says it hurts too much to try to get up and go to the bathroom. She does admit she knows she has to urinate but chooses not to get up due to pain.   Past Medical History  Diagnosis Date  . Diabetes mellitus, type 2   . Hypertension   . Unspecified asthma(493.90)   . Anxiety state, unspecified   . Hypercholesteremia   . Hypothyroidism   . Obesity   . GERD (gastroesophageal reflux disease)   . Iron deficiency anemia   . DDD (degenerative disc disease), lumbar   . Spinal stenosis   . Low back pain   . Degeneration of cervical intervertebral disc   . Left knee pain     Following fall 11/15/2009.  Marland Kitchen Acne rosacea   . Heme positive stool     EGD on 04/08/2010 by Dr. Watt Climes showed chronic gastritis and a few gastric polyps; pathology showed a fundic gland polyp.  Colonoscopy on  04/08/2010 showed small external and internal hemorrhoids, and a few benign-appearing diminutive polyps in the rectum, the distal sigmoid colon, and in the distal descending colon; pathology showed hyperplastic polyps.   . Hematochezia   . Irregular menses   . Vagina bleeding   . Atrophic vaginitis   . Viral warts, unspecified     Right hand.  . Chronic interstitial cystitis     Followed by Dr. Franchot Gallo  . Elevated transaminase level     Mild elevation, AST=48, ALT=51 on 02/01/07.  . Allergic rhinitis   . Carpal tunnel syndrome   . Atypical chest pain 12/11/2008    Normal stress nuclear study 01/12/2009 by Dr. Peter Martinique  . Bilateral leg edema   . COPD (chronic obstructive pulmonary disease)   . Dysrhythmia   . Depression   . Shortness of breath   . Pneumonia   . DPOEUMPN(361.4)    Past Surgical History  Procedure Laterality Date  . Carpal tunnel release  05/08/2000    By Dr. Youlanda Mighty. Sypher, Brooke Bonito.   Family History  Problem Relation Age of Onset  . Heart attack Father 53  . Breast cancer Neg Hx   . Colon cancer Neg Hx   . Diabetes Mother   . Hypertension Mother   . Diabetes Sister    History  Substance Use Topics  . Smoking status:  Former Smoker -- 1.00 packs/day for 15 years    Types: Cigarettes    Quit date: 01/18/1996  . Smokeless tobacco: Never Used  . Alcohol Use: No   OB History   Grav Para Term Preterm Abortions TAB SAB Ect Mult Living                 Review of Systems  Constitutional: Negative for fever.  HENT: Negative for sore throat.   Eyes: Negative for visual disturbance.  Respiratory: Negative for shortness of breath.   Cardiovascular: Negative for chest pain.  Gastrointestinal: Negative for abdominal pain.  Endocrine: Negative for polyuria.  Genitourinary: Negative for dysuria.  Musculoskeletal: Positive for back pain.  Skin: Negative for rash.  Neurological: Negative for headaches.      Allergies  Codeine sulfate; Meperidine and  related; Minocycline hcl; Penicillins; Propoxyphene n-acetaminophen; and Tetracycline hcl  Home Medications   Prior to Admission medications   Medication Sig Start Date End Date Taking? Authorizing Provider  albuterol (PROAIR HFA) 108 (90 BASE) MCG/ACT inhaler Inhale 2 puffs into the lungs every 6 (six) hours as needed for wheezing or shortness of breath. 05/25/14  Yes Axel Filler, MD  ALPRAZolam Duanne Moron) 1 MG tablet Take 1 mg by mouth 4 (four) times daily as needed for anxiety.   Yes Historical Provider, MD  amitriptyline (ELAVIL) 25 MG tablet Take 1 tablet (25 mg total) by mouth at bedtime. 08/27/14  Yes Axel Filler, MD  budesonide-formoterol [facility]) 160-4.5 MCG/ACT inhaler Inhale 2 puffs into the lungs 2 (two) times daily.   Yes Historical Provider, MD  carisoprodol (SOMA) 350 MG tablet Take 1 tablet (350 mg total) by mouth 3 (three) times daily as needed for muscle spasms. 05/29/14  Yes Axel Filler, MD  enalapril (VASOTEC) 5 MG tablet Take 0.5 tablets (2.5 mg total) by mouth daily. 05/25/14  Yes Axel Filler, MD  erythromycin ethylsuccinate (EES) 400 MG tablet Take 400 mg by mouth 2 (two) times daily. Maintenance for Rosacea   Yes Historical Provider, MD  esomeprazole (NEXIUM) 40 MG capsule Take 1 capsule (40 mg total) by mouth daily before breakfast. 07/06/14  Yes Axel Filler, MD  FLUoxetine (PROZAC) 20 MG capsule Take 20 mg by mouth daily.  11/15/13  Yes Historical Provider, MD  fluticasone (FLONASE) 50 MCG/ACT nasal spray Place 2 sprays into both nostrils daily.   Yes Historical Provider, MD  furosemide (LASIX) 20 MG tablet Take 1 tablet (20 mg total) by mouth every other day. 05/29/14  Yes Axel Filler, MD  HYDROmorphone (DILAUDID) 2 MG tablet Take 1 tablet (2 mg total) by mouth every 6 (six) hours as needed for severe pain. 09/10/14  Yes Axel Filler, MD  ipratropium (ATROVENT HFA) 17 MCG/ACT inhaler Inhale 2 puffs into the lungs 4 (four) times daily as  needed for wheezing. 05/26/14  Yes Axel Filler, MD  KRISTALOSE 20 G packet Take 20 g by mouth 2 (two) times daily as needed (constipation).  05/13/12  Yes Historical Provider, MD  metFORMIN (GLUCOPHAGE-XR) 500 MG 24 hr tablet Take 500-1,000 mg by mouth 2 (two) times daily. Take two tablets every morning and one tablet every evening. 04/08/14  Yes Axel Filler, MD  nystatin (MYCOSTATIN) powder Apply topically two times a day to affected areas of skin for 10 days. 06/25/13  Yes Axel Filler, MD  potassium chloride (KLOR-CON M10) 10 MEQ tablet Take 2 tablets (20 mEq total) by mouth daily. 04/30/14  Yes  Axel Filler, MD  pregabalin (LYRICA) 75 MG capsule Take 75 mg by mouth 2 (two) times daily.   Yes Historical Provider, MD  rOPINIRole (REQUIP) 4 MG tablet Take 4 mg by mouth at bedtime.   Yes Historical Provider, MD  rosuvastatin (CRESTOR) 10 MG tablet Take 1 tablet (10 mg total) by mouth daily. 07/06/14  Yes Axel Filler, MD  solifenacin (VESICARE) 10 MG tablet Take 1 tablet (10 mg total) by mouth daily. 09/01/14  Yes Axel Filler, MD  SYNTHROID 75 MCG tablet Take 1 tablet (75 mcg total) by mouth daily. 04/30/14  Yes Axel Filler, MD  traMADol (ULTRAM) 50 MG tablet Take 1 tablet (50 mg total) by mouth every 6 (six) hours as needed (for pain). 07/06/14  Yes Axel Filler, MD  zolpidem (AMBIEN) 10 MG tablet Take 10 mg by mouth at bedtime as needed for sleep.   Yes Historical Provider, MD  Blood Glucose Monitoring Suppl (ACCU-CHEK COMPACT CARE KIT) KIT Use to test blood glucose one time daily. Dx:250.00 03/18/14   Axel Filler, MD  glucose blood (ACCU-CHEK COMPACT STRIPS) test strip Use as instructed to check blood sugar once daily.  Dx: ICD-9 250.00. 05/20/14   Axel Filler, MD   BP 128/67  Pulse 93  Temp(Src) 98.3 F (36.8 C) (Oral)  Resp 16  Ht 5' 3"  (1.6 m)  Wt 200 lb (90.719 kg)  BMI 35.44 kg/m2  SpO2 98%  LMP 01/17/2009 Physical Exam  Nursing note and  vitals reviewed. Constitutional:  Awake, alert, nontoxic appearance with baseline speech.  HENT:  Head: Atraumatic.  Eyes: Pupils are equal, round, and reactive to light. Right eye exhibits no discharge. Left eye exhibits no discharge.  Neck: Neck supple.  Cardiovascular: Normal rate and regular rhythm.   No murmur heard. Pulmonary/Chest: Effort normal and breath sounds normal. No respiratory distress. She has no wheezes. She has no rales. She exhibits no tenderness.  Abdominal: Soft. Bowel sounds are normal. She exhibits no mass. There is no tenderness. There is no rebound.  Musculoskeletal:  Diffuse thoracic and lumbar paravertebral tenderness no overt midline bony tenderness. No obvious lesions or rashes appreciated Bilateral lower extremities non tender without new rashes or color change, baseline ROM with intact DP / PT pulses, CR<2 secs all digits bilaterally, sensation baseline light touch bilaterally for pt, motor symmetric bilateral 5 / 5 hip flexion, quadriceps, hamstrings, EHL, foot dorsiflexion, foot plantarflexion, gait somewhat antalgic but without apparent new ataxia.  Neurological:  Mental status baseline for patient.  Upper extremity motor strength and sensation intact and symmetric bilaterally.  Skin: No rash noted.  Psychiatric: She has a normal mood and affect.    ED Course  Procedures (including critical care time) Labs Review Labs Reviewed - No data to display  Imaging Review Dg Chest 2 View  09/15/2014   CLINICAL DATA:  [place] while walking and subsequently into sided of door, left-sided chest pain initial encounter  EXAM: CHEST  2 VIEW  COMPARISON:  05/01/2013  FINDINGS: The cardiac shadow is mildly prominent. No focal infiltrate or sizable effusion is seen. No acute bony abnormality is noted.  IMPRESSION: No active cardiopulmonary disease.   Electronically Signed   By: Inez Catalina M.D.   On: 09/15/2014 14:05   Dg Thoracic Spine 2 View  09/15/2014   CLINICAL  DATA:  [place] last night on grass while walking.  Pain.  EXAM: THORACIC SPINE - 2 VIEW  COMPARISON:  Chest x-ray 05/01/2013.  FINDINGS: Perispinal soft tissues are normal. Diffuse degenerative changes thoracic spine. Changes of ankylosing spondylitis noted. No acute abnormality. Upper most portion of the thoracic spine not well identified on lateral views.  IMPRESSION: Ankylosing spondylitis and degenerative changes thoracic spine. No acute abnormality.   Electronically Signed   By: Marcello Moores  Register   On: 09/15/2014 14:06   Dg Lumbar Spine Complete  09/15/2014   CLINICAL DATA:  [place] last night while walking. Pain is in the lower midline of the back and radiates down the left leg.  EXAM: LUMBAR SPINE - COMPLETE 4+ VIEW  COMPARISON:  01/05/2014  FINDINGS: Mild left convex lumbar scoliosis. Stable degenerative/ non isthmic spondylolisthesis at L5. Stable multilevel disc disease and facet disease. No acute bony findings. The bony pelvis is intact. Small foci of bilateral hip AVN noted. This appears stable since 2012.  IMPRESSION: Stable degenerative lumbar spondylosis with multilevel disc disease and facet disease. Progressive disc disease noted at L2-3.  Degenerative/non isthmic spondylolisthesis at L5.  Chronic bilateral hip AVN.   Electronically Signed   By: Kalman Jewels M.D.   On: 09/15/2014 14:12     EKG Interpretation None      MDM  Vitals stable - WNL -afebrile Pt resting comfortably in ED. PE not concerning for acute or emergent pathology. Doubt cord pathology, cauda equina. Patient has diffuse tenderness without overt midline bony tenderness. No focal neuro deficits on exam. Patient is ambulating independently without any apparent ataxia. Urination on self is due to choice and not autonomic dysfunction  Imaging shows no evidence of acute or emergent spinal pathology. No evidence of new fracture, dislocation or other osseous abnormality Patient reports having her home pain regimen and  medications at home. Stable, in good condition and is appropriate for discharge Discussed f/u with PCP and return precautions, pt very amenable to plan.  Prior to patient discharge, I discussed and reviewed this case with Dr. Jeneen Rinks      Final diagnoses:  Fall  Chronic pain  Low back pain without sciatica, unspecified back pain laterality        Verl Dicker, PA-C 09/15/14 1645

## 2014-09-15 NOTE — ED Notes (Signed)
Pt states "My dogs was fighting last night and I broke it up and hit my head and my back on my mobile home and now I keep wetting myself." Pt asked where she hit her head and back, asked to show me where it hurts and her response was "on my mobile home." Asked patient several times the location of  Where it hurts, pt responded "on my mobile home." Pt speech is very mumbled and pt reports to taking medications for pain, including 2 mg of Dilaudid last night.

## 2014-09-15 NOTE — Discharge Instructions (Signed)
You were evaluated in the ED today for your fall and back pain. There was no emergent or acute source for your back pain. You may follow-up with your primary care for further evaluation and management of your back pain. Continue to take her home meds for pain control for your chronic back pain

## 2014-09-15 NOTE — ED Notes (Signed)
Patient took to room 47.  Patient ambulated without difficulty from wheelchair to bed.  Patient given gown, sheet, and oriented to room.   Patient understands that she has to be hooked up to bp, 02, and heart leads once she gets undressed and in a gown.   Care handoff to Tobi BastosAnna, [place].

## 2014-09-15 NOTE — ED Provider Notes (Signed)
Pt seen and evaluated.  D/W PA.  C/O pain "everywhere".  TTP throughout entire back and chest.  Ambulatory, transfers bed to commode.  Pt urinated at bedside, but states, "it just hurt to bad to try to move" Indicated Rt lower ribs as source of worst pain.  No crepitus, normal BBS. Plan Plain films of spine and ribs, pain meds.  Pt without neurological loss on exam.   Rolland PorterMark Reyanne Hussar, MD 09/15/14 1221

## 2014-09-15 NOTE — ED Notes (Signed)
Pt presents via [facility] with report of back pain after breaking up her dogs fighting last night.  Pt took her pain medication that gave no relief, awoke this morning with increased pain, pt ambulatory to truck.

## 2014-09-27 ENCOUNTER — Other Ambulatory Visit: Payer: Self-pay | Admitting: Internal Medicine

## 2014-09-28 ENCOUNTER — Other Ambulatory Visit: Payer: Self-pay | Admitting: Internal Medicine

## 2014-09-29 ENCOUNTER — Other Ambulatory Visit: Payer: Self-pay | Admitting: Internal Medicine

## 2014-09-29 ENCOUNTER — Ambulatory Visit (HOSPITAL_COMMUNITY)
Admission: RE | Admit: 2014-09-29 | Discharge: 2014-09-29 | Disposition: A | Discharge status: Home / Self Care | Payer: Commercial Managed Care - HMO | Source: Ambulatory Visit | Attending: Internal Medicine | Admitting: Internal Medicine

## 2014-09-29 ENCOUNTER — Encounter: Payer: Self-pay | Admitting: Internal Medicine

## 2014-09-29 ENCOUNTER — Ambulatory Visit (INDEPENDENT_AMBULATORY_CARE_PROVIDER_SITE_OTHER): Payer: Commercial Managed Care - HMO | Admitting: Internal Medicine

## 2014-09-29 VITALS — BP 143/79 | HR 88 | Temp 98.1°F | Wt 216.8 lb

## 2014-09-29 DIAGNOSIS — M160 Bilateral primary osteoarthritis of hip: Secondary | ICD-10-CM | POA: Insufficient documentation

## 2014-09-29 DIAGNOSIS — R079 Chest pain, unspecified: Secondary | ICD-10-CM | POA: Diagnosis present

## 2014-09-29 DIAGNOSIS — R0781 Pleurodynia: Secondary | ICD-10-CM

## 2014-09-29 DIAGNOSIS — M25552 Pain in left hip: Secondary | ICD-10-CM | POA: Diagnosis present

## 2014-09-29 DIAGNOSIS — Z23 Encounter for immunization: Secondary | ICD-10-CM

## 2014-09-29 DIAGNOSIS — E119 Type 2 diabetes mellitus without complications: Secondary | ICD-10-CM

## 2014-09-29 DIAGNOSIS — M5440 Lumbago with sciatica, unspecified side: Secondary | ICD-10-CM

## 2014-09-29 DIAGNOSIS — E039 Hypothyroidism, unspecified: Secondary | ICD-10-CM

## 2014-09-29 DIAGNOSIS — M879 Osteonecrosis, unspecified: Secondary | ICD-10-CM | POA: Diagnosis not present

## 2014-09-29 DIAGNOSIS — N3010 Interstitial cystitis (chronic) without hematuria: Secondary | ICD-10-CM

## 2014-09-29 DIAGNOSIS — M25551 Pain in right hip: Secondary | ICD-10-CM | POA: Diagnosis present

## 2014-09-29 DIAGNOSIS — I10 Essential (primary) hypertension: Secondary | ICD-10-CM

## 2014-09-29 LAB — COMPLETE METABOLIC PANEL WITH GFR
ALT: 15 U/L (ref 0–35)
AST: 17 U/L (ref 0–37)
Albumin: 4.2 g/dL (ref 3.5–5.2)
Alkaline Phosphatase: 71 U/L (ref 39–117)
BUN: 8 mg/dL (ref 6–23)
CALCIUM: 9.2 mg/dL (ref 8.4–10.5)
CO2: 24 mEq/L (ref 19–32)
CREATININE: 0.59 mg/dL (ref 0.50–1.10)
Chloride: 96 mEq/L (ref 96–112)
GFR, Est African American: >89 mL/min
GFR, Est Non African American: >89 mL/min
Glucose, Bld: 106 mg/dL — ABNORMAL HIGH (ref 70–99)
Potassium: 4.5 mEq/L (ref 3.5–5.3)
SODIUM: 130 meq/L — AB (ref 135–145)
TOTAL PROTEIN: 6.3 g/dL (ref 6.0–8.3)
Total Bilirubin: 0.4 mg/dL (ref 0.2–1.2)

## 2014-09-29 LAB — CBC WITH DIFFERENTIAL/PLATELET
Basophils Absolute: 0.1 10*3/uL (ref 0.0–0.1)
Basophils Relative: 1 % (ref 0–1)
EOS ABS: 0.3 10*3/uL (ref 0.0–0.7)
EOS PCT: 4 % (ref 0–5)
HCT: 34.4 % — ABNORMAL LOW (ref 36.0–46.0)
HEMOGLOBIN: 11.6 g/dL — AB (ref 12.0–15.0)
Lymphocytes Relative: 29 % (ref 12–46)
Lymphs Abs: 2.4 10*3/uL (ref 0.7–4.0)
MCH: 29 pg (ref 26.0–34.0)
MCHC: 33.7 g/dL (ref 30.0–36.0)
MCV: 86 fL (ref 78.0–100.0)
MONOS PCT: 5 % (ref 3–12)
Monocytes Absolute: 0.4 10*3/uL (ref 0.1–1.0)
Neutro Abs: 5.1 10*3/uL (ref 1.7–7.7)
Neutrophils Relative %: 61 % (ref 43–77)
PLATELETS: 228 10*3/uL (ref 150–400)
RBC: 4 MIL/uL (ref 3.87–5.11)
RDW: 14 % (ref 11.5–15.5)
WBC: 8.4 10*3/uL (ref 4.0–10.5)

## 2014-09-29 LAB — POCT GLYCOSYLATED HEMOGLOBIN (HGB A1C): Hemoglobin A1C: 6.2

## 2014-09-29 LAB — GLUCOSE, CAPILLARY: GLUCOSE-CAPILLARY: 121 mg/dL — AB (ref 70–99)

## 2014-09-29 LAB — TSH: TSH: 2.429 u[IU]/mL (ref 0.350–4.500)

## 2014-09-29 NOTE — Assessment & Plan Note (Signed)
BP Readings from Last 3 Encounters:  09/29/14 143/79  09/15/14 128/67  05/20/14 120/80    Lab Results  Component Value Date   NA 138 03/18/2014   K 3.8 03/18/2014   CREATININE 0.55 03/18/2014    Assessment: Blood pressure control: controlled Progress toward BP goal:  at goal Comments: doing well on enalapril 2.5 mg daily and furosemide 20 mg every other day  Plan: Medications:  continue current medications

## 2014-09-29 NOTE — Assessment & Plan Note (Signed)
Dg Ribs Unilateral W/chest Right  09/29/2014   CLINICAL DATA:  Fall 16 days ago.  Right-sided pain  EXAM: RIGHT RIBS AND CHEST - 3+ VIEW  COMPARISON:  09/15/2014  FINDINGS: No fracture or other bone lesions are seen involving the ribs. There is no evidence of pneumothorax or pleural effusion. Both lungs are clear. Heart size and mediastinal contours are within normal limits.  IMPRESSION: Negative.   Electronically Signed   By: Marlan Palauharles  Clark M.D.   On: 09/29/2014 12:09   Assessment: Patient has pain and tenderness along the right lower costal margin following a fall about 2 weeks ago, but no evidence of rib fracture on rib x-ray.    Plan: Continue tramadol supplemented by hydromorphone if needed as per chronic pain regimen.

## 2014-09-29 NOTE — Assessment & Plan Note (Signed)
Lab Results  Component Value Date   TSH 3.129 06/25/2013     Assessment: Doing well on Synthroid 75 mcg daily, with no symptoms of thyroid dysfunction.  Plan: Check a TSH today; continue Synthroid 75 mcg daily pending that result.

## 2014-09-29 NOTE — Patient Instructions (Signed)
Please schedule an appointment with your urologist Dr. Retta Dionesahlstedt for follow-up of your urinary incontinence. Please schedule an appointment with your neurosurgeon Dr. Lovell SheehanJenkins for follow-up of your back pain.

## 2014-09-29 NOTE — Progress Notes (Signed)
   Subjective:    Patient ID: Mary NegusSara Flenner, female    DOB: 11-Jun-1961, 53 y.o.   MRN: [id number]  HPI Patient presents with complaint of right lower rib pain and right low back pain which have persisted following a fall for which she was seen in the emergency department on 09/15/2014.  She also complains of urinary urgency with incontinence for more than a week.  She reports that she has been compliant with her medications; she did not bring her medication bottles to clinic today for review.  Her blood sugars have been reasonably well controlled, with an average value over the past month 144 (range 79 to227).  She denies any weakness; she has some stable numbness associated with her diabetic neuropathy, but no recent change.  She is able to ambulate with her walker.   Review of Systems  Constitutional: Negative for fever, chills and diaphoresis.  Respiratory: Negative for shortness of breath and wheezing.   Cardiovascular: Negative for chest pain and leg swelling.  Gastrointestinal: Negative for nausea, vomiting, abdominal pain and abdominal distention.  Genitourinary: Negative for dysuria.  Neurological: Negative for syncope and weakness.       Objective:   Physical Exam  Constitutional: No distress.  Cardiovascular: Normal rate, regular rhythm and normal heart sounds.  Exam reveals no gallop and no friction rub.   No murmur heard. Pulmonary/Chest: Effort normal and breath sounds normal. No respiratory distress. She has no wheezes. She has no rales. She exhibits tenderness (Tender to palpation along the right lower costal margin laterally; no palpable deformity).  Abdominal: Soft. Bowel sounds are normal.  Musculoskeletal:       Legs: Neurological: She has normal strength. No sensory deficit.          Assessment & Plan:

## 2014-09-29 NOTE — Assessment & Plan Note (Signed)
Dg Hip Bilateral W/pelvis  09/29/2014   CLINICAL DATA:  Fall 16 days ago.  Bilateral hip pain  EXAM: BILATERAL HIP WITH PELVIS - 4+ VIEW  COMPARISON:  09/15/2014  FINDINGS: Mild osteoarthritis in both hip joints with joint space narrowing. Chronic femoral head AVN bilaterally without collapse.  Negative for fracture or mass lesion. SI joint show mild degenerative change.  IMPRESSION: Negative for fracture.   Electronically Signed   By: Marlan Palauharles  Clark M.D.   On: 09/29/2014 12:08   Assessment: Patient has chronic low back pain with sciatica and lumbar disc disease followed by neurosurgeon Dr. Lovell SheehanJenkins; she has declined surgery in the past, but is currently considering whether to proceed.  Following her fall about 2 weeks ago, she now complains of pain in her right low back below the pelvic brim.  Bilateral hip and pelvic x-ray shows mild SI joint degenerative change and chronic femoral head AVM bilaterally without collapse, but no fracture.    Plan: Continue tramadol supplemented by hydromorphone as needed as per her chronic pain regimen.  I advised patient to followup with her neurosurgeon Dr. Lovell SheehanJenkins regarding her chronic back pain with sciatica.

## 2014-09-29 NOTE — Assessment & Plan Note (Signed)
Lab Results  Component Value Date   HGBA1C 6.2 09/29/2014   HGBA1C 6.1 05/20/2014   HGBA1C 6.3 03/03/2014     Assessment: Diabetes control: good control (HgbA1C at goal) Progress toward A1C goal:  at goal Comments: doing well on metformin XR 500 mg twice a day Plan: Medications:  continue current medications Home glucose monitoring: Frequency: once a day Timing: before breakfast Instruction/counseling given: reminded to get eye exam, reminded to bring medications to each visit and discussed foot care

## 2014-09-29 NOTE — Assessment & Plan Note (Signed)
Assessment: Patient has chronic interstitial cystitis managed by her urologist Dr. Retta Dionesahlstedt.  She reports urgency with incontinence for more than a week.  She has no weakness or numbness that would suggest a neurologic problem related to her spine disease.  Plan: Check a urinalysis and urine culture; I advised patient to call Dr. Retta Dionesahlstedt and to see him in follow-up as soon as possible.

## 2014-09-30 LAB — URINALYSIS, ROUTINE W REFLEX MICROSCOPIC
BILIRUBIN URINE: NEGATIVE
GLUCOSE, UA: NEGATIVE mg/dL
Hgb urine dipstick: NEGATIVE
Ketones, ur: NEGATIVE mg/dL
Leukocytes, UA: NEGATIVE
Nitrite: NEGATIVE
PH: 7 (ref 5.0–8.0)
Protein, ur: NEGATIVE mg/dL
SPECIFIC GRAVITY, URINE: 1.014 (ref 1.005–1.030)
Urobilinogen, UA: 0.2 mg/dL (ref 0.0–1.0)

## 2014-09-30 LAB — URINE CULTURE

## 2014-10-01 ENCOUNTER — Telehealth: Payer: Self-pay | Admitting: Internal Medicine

## 2014-10-01 DIAGNOSIS — M5440 Lumbago with sciatica, unspecified side: Secondary | ICD-10-CM

## 2014-10-01 DIAGNOSIS — N3941 Urge incontinence: Secondary | ICD-10-CM | POA: Insufficient documentation

## 2014-10-01 DIAGNOSIS — M48061 Spinal stenosis, lumbar region without neurogenic claudication: Secondary | ICD-10-CM

## 2014-10-01 NOTE — Telephone Encounter (Signed)
I spoke with patient by phone today about her lab results, her urge incontinence, and her back pain.  She reports ongoing problems with her urge incontinence without improvement, and continued back pain.  The urinalysis and urine culture do not show evidence of urinary tract infection as the cause of her recent urge incontinence.  She is taking Vesicare as prescribed by her urologist for chronic cystitis.  She has chronic back pain with sciatica, and a history of herniated disc and lumbar spinal stenosis being followed by neurosurgeon Dr. Lovell SheehanJenkins.  Her recent urge incontinence began following a fall which she was evaluated in the emergency department on 09/15/2014.  She has had increased back pain following the fall.  I am concerned that her urge incontinence may be related to her lumbar spinal problems, given the onset of urge incontinence and increased back pain following the fall.  Plan is to obtain an MRI of the lumbar spine as soon as possible to assess.  She called her urologist to see if he can evaluate her in the near future, and she said it would be December before she can be worked in; we will try to expedite that visit as well.

## 2014-10-02 ENCOUNTER — Ambulatory Visit (HOSPITAL_COMMUNITY): Payer: Medicare HMO

## 2014-10-02 ENCOUNTER — Encounter: Payer: Self-pay | Admitting: Internal Medicine

## 2014-10-02 ENCOUNTER — Ambulatory Visit (HOSPITAL_COMMUNITY)
Admission: RE | Admit: 2014-10-02 | Discharge: 2014-10-02 | Disposition: A | Discharge status: Home / Self Care | Payer: Medicare HMO | Source: Ambulatory Visit | Attending: Internal Medicine | Admitting: Internal Medicine

## 2014-10-02 DIAGNOSIS — M47816 Spondylosis without myelopathy or radiculopathy, lumbar region: Secondary | ICD-10-CM | POA: Diagnosis not present

## 2014-10-02 DIAGNOSIS — M4806 Spinal stenosis, lumbar region: Secondary | ICD-10-CM | POA: Insufficient documentation

## 2014-10-02 DIAGNOSIS — M549 Dorsalgia, unspecified: Secondary | ICD-10-CM | POA: Diagnosis present

## 2014-10-02 DIAGNOSIS — M5440 Lumbago with sciatica, unspecified side: Secondary | ICD-10-CM

## 2014-10-02 DIAGNOSIS — M5126 Other intervertebral disc displacement, lumbar region: Secondary | ICD-10-CM | POA: Insufficient documentation

## 2014-10-02 DIAGNOSIS — M48061 Spinal stenosis, lumbar region without neurogenic claudication: Secondary | ICD-10-CM

## 2014-10-02 DIAGNOSIS — M79606 Pain in leg, unspecified: Secondary | ICD-10-CM | POA: Diagnosis present

## 2014-10-02 DIAGNOSIS — N3941 Urge incontinence: Secondary | ICD-10-CM

## 2014-10-02 DIAGNOSIS — R32 Unspecified urinary incontinence: Secondary | ICD-10-CM | POA: Diagnosis present

## 2014-10-02 NOTE — Assessment & Plan Note (Signed)
10/02/2014  MRI LUMBAR SPINE WITHOUT CONTRAST   IMPRESSION: 1. Degenerative lumbar spondylosis with multilevel disc disease and facet disease. 2. Interval desiccation and retraction of the disc extrusion at L2-3. There is persistent moderately severe spinal and bilateral lateral recess stenosis Stable shallow extra foraminal disc protrusion on the right possibly irritating the right L2 nerve root. 3. Stable spinal and bilateral lateral recess stenosis at L3-4 along with stable mild bilateral foraminal stenosis. 4. Interval retraction and desiccation of the disc protrusion at L4-5. There is still mild mass effect on the ventral thecal sac. No foraminal stenosis. 5. Stable advanced degenerative disease at L5-S1 with moderate bilateral foraminal stenosis.   Electronically Signed   By: Loralie ChampagneMark  Gallerani M.D.   On: 10/02/2014 16:07    Documentation note: Because of persisting urge incontinence with worsening low back pain following a fall about 3 weeks ago, I obtained an MRI of the lumbar spine today.  The result showed no acute findings that would explain patient's incontinence.  I reviewed the scan with the radiologist, and also spoke by telephone with her neurosurgeon Dr. Tressie StalkerJeffrey Jenkins who also reviewed the scan.  He did not feel there was any indication for surgical intervention, and advised that she follow-up with him in his office electively.    I spoke with patient in the clinic following the scan, and she reports no new problems since her office visit earlier this week.  I discussed the results of the scan with her, and advised that she continue her current pain regimen and follow up with Dr. Lovell SheehanJenkins as advised.  We were able to move up her appointment with her urologist Dr. Retta Dionesahlstedt to 11/25 for evaluation of her urge incontinence, and this information was given to her today.

## 2014-10-02 NOTE — Assessment & Plan Note (Signed)
Documentation note: Because of persisting urge incontinence with worsening low back pain following a fall about 3 weeks ago, and no evidence of urinary tract infection on urinalysis with culture, I obtained an MRI of the lumbar spine today.  The result showed no acute findings that would explain patient's incontinence.  I reviewed the scan with the radiologist, and also spoke by telephone with her neurosurgeon Dr. Tressie StalkerJeffrey Jenkins who also reviewed the scan.  He did not feel there was any indication for surgical intervention, and advised that she follow-up with him in his office electively.    I spoke with patient in the clinic following the scan, and she reports no new problems since her office visit earlier this week.  I discussed the results of the scan with her, and advised that she continue her current pain regimen and follow up with Dr. Lovell SheehanJenkins as advised.  We were able to move up her appointment with her urologist Dr. Retta Dionesahlstedt to 11/25 for evaluation of her urge incontinence, and this information was given to her today.

## 2014-10-02 NOTE — Progress Notes (Signed)
10/02/2014  MRI LUMBAR SPINE WITHOUT CONTRAST   IMPRESSION: 1. Degenerative lumbar spondylosis with multilevel disc disease and facet disease. 2. Interval desiccation and retraction of the disc extrusion at L2-3. There is persistent moderately severe spinal and bilateral lateral recess stenosis Stable shallow extra foraminal disc protrusion on the right possibly irritating the right L2 nerve root. 3. Stable spinal and bilateral lateral recess stenosis at L3-4 along with stable mild bilateral foraminal stenosis. 4. Interval retraction and desiccation of the disc protrusion at L4-5. There is still mild mass effect on the ventral thecal sac. No foraminal stenosis. 5. Stable advanced degenerative disease at L5-S1 with moderate bilateral foraminal stenosis.   Electronically Signed   By: Mark  Gallerani M.D.   On: 10/02/2014 16:07    Documentation note: Because of persisting urge incontinence with worsening low back pain following a fall about 3 weeks ago, I obtained an MRI of the lumbar spine today.  The result showed no acute findings that would explain patient's incontinence.  I reviewed the scan with the radiologist, and also spoke by telephone with her neurosurgeon Dr. Jeffrey Jenkins who also reviewed the scan.  He did not feel there was any indication for surgical intervention, and advised that she follow-up with him in his office electively.    I spoke with patient in the clinic following the scan, and she reports no new problems since her office visit earlier this week.  I discussed the results of the scan with her, and advised that she continue her current pain regimen and follow up with Dr. Jenkins as advised.  We were able to move up her appointment with her urologist Dr. Dahlstedt to 11/25 for evaluation of her urge incontinence, and this information was given to her today. 

## 2014-10-04 ENCOUNTER — Other Ambulatory Visit: Payer: Self-pay | Admitting: Internal Medicine

## 2014-10-06 NOTE — Telephone Encounter (Signed)
Refill approved - nurse to call in. 

## 2014-10-08 NOTE — Telephone Encounter (Signed)
Called to pharm 

## 2014-10-09 ENCOUNTER — Other Ambulatory Visit: Payer: Self-pay | Admitting: Internal Medicine

## 2014-10-13 ENCOUNTER — Other Ambulatory Visit: Payer: Self-pay | Admitting: *Deleted

## 2014-10-14 MED ORDER — HYDROMORPHONE HCL 2 MG PO TABS: 2.0000 mg | ORAL_TABLET | Freq: Four times a day (QID) | ORAL | Status: DC | PRN

## 2014-10-14 NOTE — Telephone Encounter (Signed)
Refill printed and signed - nurse to complete. 

## 2014-10-23 ENCOUNTER — Other Ambulatory Visit: Payer: Self-pay | Admitting: *Deleted

## 2014-10-23 MED ORDER — GLUCOSE BLOOD VI STRP: ORAL_STRIP | Status: DC

## 2014-10-24 ENCOUNTER — Other Ambulatory Visit: Payer: Self-pay | Admitting: Internal Medicine

## 2014-10-27 ENCOUNTER — Encounter: Payer: Commercial Managed Care - HMO | Admitting: Family Medicine

## 2014-11-04 ENCOUNTER — Other Ambulatory Visit: Payer: Self-pay | Admitting: Internal Medicine

## 2014-11-05 ENCOUNTER — Encounter: Payer: Commercial Managed Care - HMO | Admitting: Obstetrics & Gynecology

## 2014-11-10 ENCOUNTER — Emergency Department (INDEPENDENT_AMBULATORY_CARE_PROVIDER_SITE_OTHER)
Admission: EM | Admit: 2014-11-10 | Discharge: 2014-11-10 | Disposition: A | Discharge status: Home / Self Care | Payer: Commercial Managed Care - HMO | Source: Home / Self Care | Attending: Family Medicine | Admitting: Family Medicine

## 2014-11-10 ENCOUNTER — Encounter (HOSPITAL_COMMUNITY): Payer: Self-pay | Admitting: *Deleted

## 2014-11-10 DIAGNOSIS — B373 Candidiasis of vulva and vagina: Secondary | ICD-10-CM

## 2014-11-10 DIAGNOSIS — B3731 Acute candidiasis of vulva and vagina: Secondary | ICD-10-CM

## 2014-11-10 LAB — POCT URINALYSIS DIP (DEVICE)
Bilirubin Urine: NEGATIVE
Glucose, UA: NEGATIVE mg/dL
Hgb urine dipstick: NEGATIVE
Ketones, ur: NEGATIVE mg/dL
LEUKOCYTES UA: NEGATIVE
Nitrite: NEGATIVE
PROTEIN: NEGATIVE mg/dL
SPECIFIC GRAVITY, URINE: 1.015 (ref 1.005–1.030)
UROBILINOGEN UA: 0.2 mg/dL (ref 0.0–1.0)
pH: 5.5 (ref 5.0–8.0)

## 2014-11-10 MED ORDER — FLUCONAZOLE 200 MG PO TABS
200.0000 mg | ORAL_TABLET | Freq: Every day | ORAL | Status: DC
Start: 2014-11-10 — End: 2014-11-17

## 2014-11-10 MED ORDER — FLUCONAZOLE 200 MG PO TABS: 200.0000 mg | ORAL_TABLET | Freq: Every day | ORAL | Status: DC

## 2014-11-10 NOTE — ED Provider Notes (Signed)
CSN: [id number]     Arrival date & time 11/10/14  1452 History   First MD Initiated Contact with Patient 11/10/14 1516     Chief Complaint  Patient presents with  . Vaginal Pain   (Consider location/radiation/quality/duration/timing/severity/associated sxs/prior Treatment) HPI            53 year old female presents complaining of vaginal infection. For approximately 2 months she has had irritation, pain, itching in her vagina. She was seen by her primary care doctor approximately one month ago and was started on estrogen cream and referred to gynecology. Since then she has not experienced any improvement. She denies any vaginal discharge, abdominal pain, fever, chills, NVD, or pelvic pain she denies any risk for STDs. Admits to mild low back pain.  No flank pain.      Past Medical History  Diagnosis Date  . Diabetes mellitus, type 2   . Hypertension   . Unspecified asthma(493.90)   . Anxiety state, unspecified   . Hypercholesteremia   . Hypothyroidism   . Obesity   . GERD (gastroesophageal reflux disease)   . Iron deficiency anemia   . DDD (degenerative disc disease), lumbar   . Spinal stenosis   . Low back pain   . Degeneration of cervical intervertebral disc   . Left knee pain     Following fall 11/15/2009.  Marland Kitchen Acne rosacea   . Heme positive stool     EGD on 04/08/2010 by Dr. Watt Climes showed chronic gastritis and a few gastric polyps; pathology showed a fundic gland polyp.  Colonoscopy on 04/08/2010 showed small external and internal hemorrhoids, and a few benign-appearing diminutive polyps in the rectum, the distal sigmoid colon, and in the distal descending colon; pathology showed hyperplastic polyps.   . Hematochezia   . Irregular menses   . Vagina bleeding   . Atrophic vaginitis   . Viral warts, unspecified     Right hand.  . Chronic interstitial cystitis     Followed by Dr. Franchot Gallo  . Elevated transaminase level     Mild elevation, AST=48, ALT=51 on 02/01/07.  .  Allergic rhinitis   . Carpal tunnel syndrome   . Atypical chest pain 12/11/2008    Normal stress nuclear study 01/12/2009 by Dr. Peter Martinique  . Bilateral leg edema   . COPD (chronic obstructive pulmonary disease)   . Dysrhythmia   . Depression   . Shortness of breath   . Pneumonia   . TIWPYKDX(833.8)    Past Surgical History  Procedure Laterality Date  . Carpal tunnel release  05/08/2000    By Dr. Youlanda Mighty. Sypher, Brooke Bonito.   Family History  Problem Relation Age of Onset  . Heart attack Father 42  . Breast cancer Neg Hx   . Colon cancer Neg Hx   . Diabetes Mother   . Hypertension Mother   . Diabetes Sister    History  Substance Use Topics  . Smoking status: Former Smoker -- 1.00 packs/day for 15 years    Types: Cigarettes    Quit date: 01/18/1996  . Smokeless tobacco: Never Used  . Alcohol Use: No   OB History    No data available     Review of Systems  Constitutional: Negative for fever and chills.  Gastrointestinal: Negative for nausea, vomiting, diarrhea and anal bleeding.  Genitourinary: Positive for vaginal pain (and itching/irritation ). Negative for dysuria, urgency, frequency, hematuria, flank pain, vaginal bleeding, vaginal discharge, genital sores and pelvic pain.  Musculoskeletal: Positive for  back pain.  All other systems reviewed and are negative.   Allergies  Codeine sulfate; Meperidine and related; Minocycline hcl; Penicillins; Propoxyphene n-acetaminophen; and Tetracycline hcl  Home Medications   Prior to Admission medications   Medication Sig Start Date End Date Taking? Authorizing Provider  albuterol (PROAIR HFA) 108 (90 BASE) MCG/ACT inhaler Inhale 2 puffs into the lungs every 6 (six) hours as needed for wheezing or shortness of breath. 05/25/14   Axel Filler, MD  ALPRAZolam Duanne Moron) 1 MG tablet Take 1 mg by mouth 4 (four) times daily as needed for anxiety.    Historical Provider, MD  amitriptyline (ELAVIL) 25 MG tablet Take 1 tablet (25 mg total)  by mouth at bedtime. 08/27/14   Axel Filler, MD  Blood Glucose Monitoring Suppl (ACCU-CHEK COMPACT CARE KIT) KIT Use to test blood glucose one time daily. Dx:250.00 03/18/14   Axel Filler, MD  budesonide-formoterol [facility]) 160-4.5 MCG/ACT inhaler Inhale 2 puffs into the lungs 2 (two) times daily.    Historical Provider, MD  carisoprodol (SOMA) 350 MG tablet Take 1 tablet (350 mg total) by mouth 3 (three) times daily as needed for muscle spasms. 10/06/14   Axel Filler, MD  enalapril (VASOTEC) 5 MG tablet Take 0.5 tablets (2.5 mg total) by mouth daily. 09/28/14   Axel Filler, MD  erythromycin ethylsuccinate (EES) 400 MG tablet Take 400 mg by mouth 2 (two) times daily. Maintenance for Rosacea    Historical Provider, MD  esomeprazole (NEXIUM) 40 MG capsule Take 1 capsule (40 mg total) by mouth daily before breakfast. 07/06/14   Axel Filler, MD  fluconazole (DIFLUCAN) 200 MG tablet Take 1 tablet (200 mg total) by mouth daily. 11/10/14 11/17/14  Freeman Caldron Ryin Ambrosius, PA-C  FLUoxetine (PROZAC) 20 MG capsule Take 20 mg by mouth daily.  11/15/13   Historical Provider, MD  fluticasone (FLONASE) 50 MCG/ACT nasal spray Place 2 sprays into both nostrils daily.    Historical Provider, MD  furosemide (LASIX) 20 MG tablet Take 1 tablet (20 mg total) by mouth every other day. 05/29/14   Axel Filler, MD  glucose blood (ACCU-CHEK COMPACT STRIPS) test strip Use as instructed to check blood sugar once daily.  Dx: ICD-9 250.00. 10/23/14   Axel Filler, MD  HYDROmorphone (DILAUDID) 2 MG tablet Take 1 tablet (2 mg total) by mouth every 6 (six) hours as needed for severe pain. 10/14/14   Axel Filler, MD  ipratropium (ATROVENT HFA) 17 MCG/ACT inhaler Inhale 2 puffs into the lungs 4 (four) times daily as needed for wheezing. 10/09/14   Axel Filler, MD  KRISTALOSE 20 G packet Take 20 g by mouth 2 (two) times daily as needed (constipation).  05/13/12   Historical Provider, MD   metFORMIN (GLUCOPHAGE-XR) 500 MG 24 hr tablet Take 1 tablet (500 mg total) by mouth 2 (two) times daily. 09/28/14   Axel Filler, MD  nystatin (MYCOSTATIN) powder Apply topically two times a day to affected areas of skin for 10 days. 06/25/13   Axel Filler, MD  potassium chloride (KLOR-CON M10) 10 MEQ tablet Take 2 tablets (20 mEq total) by mouth daily. 10/26/14   Axel Filler, MD  pregabalin (LYRICA) 75 MG capsule Take 75 mg by mouth 2 (two) times daily.    Historical Provider, MD  rOPINIRole (REQUIP) 4 MG tablet Take 4 mg by mouth at bedtime.    Historical Provider, MD  rosuvastatin (CRESTOR) 10 MG tablet Take 1 tablet (  10 mg total) by mouth daily. 11/04/14   Axel Filler, MD  solifenacin (VESICARE) 10 MG tablet Take 1 tablet (10 mg total) by mouth daily. 09/01/14   Axel Filler, MD  SYNTHROID 75 MCG tablet Take 1 tablet (75 mcg total) by mouth daily. 04/30/14   Axel Filler, MD  traMADol (ULTRAM) 50 MG tablet Take 1 tablet (50 mg total) by mouth every 6 (six) hours as needed (for pain). 07/06/14   Axel Filler, MD  zolpidem (AMBIEN) 10 MG tablet Take 10 mg by mouth at bedtime as needed for sleep.    Historical Provider, MD   BP 122/85 mmHg  Pulse 98  Temp(Src) 98.4 F (36.9 C) (Oral)  Resp 16  SpO2 97%  LMP 01/17/2009 Physical Exam  Constitutional: She is oriented to person, place, and time. Vital signs are normal. She appears well-developed and well-nourished. No distress.  HENT:  Head: Normocephalic and atraumatic.  Cardiovascular: Normal rate, regular rhythm and normal heart sounds.   Pulmonary/Chest: Effort normal and breath sounds normal. No respiratory distress.  Abdominal: Soft. Bowel sounds are normal. She exhibits no distension and no mass. There is no tenderness. There is no rebound and no guarding.  Genitourinary:  The labia majora minora are erythematous, swollen. There is a macular scaling rash in the intertriginous folds with satellite  lesions. There is a small amount of thick white clumpy discharge noted from the vagina. Full speculum exam was not able to be done because she was in significant pain.  Neurological: She is alert and oriented to person, place, and time. She has normal strength. Coordination normal.  Skin: Skin is warm and dry. No rash noted. She is not diaphoretic.  Psychiatric: She has a normal mood and affect. Judgment normal.  Nursing note and vitals reviewed.   ED Course  Procedures (including critical care time) Labs Review Labs Reviewed  POCT URINALYSIS DIP (DEVICE)    Imaging Review No results found.   MDM   1. Vagina, candidiasis    Treat with Diflucan for one week. Follow-up with gynecologist as previously scheduled  Meds ordered this encounter  Medications  . DISCONTD: fluconazole (DIFLUCAN) 200 MG tablet    Sig: Take 1 tablet (200 mg total) by mouth daily.    Dispense:  7 tablet    Refill:  0  . fluconazole (DIFLUCAN) 200 MG tablet    Sig: Take 1 tablet (200 mg total) by mouth daily.    Dispense:  7 tablet    Refill:  [place] Yvonne Petite, PA-C 11/10/14 [phone]

## 2014-11-10 NOTE — ED Notes (Signed)
Pt  Reports     Symptoms  Of  Vaginal   Irritation       painfull  To  The  Touch  Pt    Reports  The  Symptoms       X  3   Weeks   Not  releived    By  Cream

## 2014-11-10 NOTE — Discharge Instructions (Signed)

## 2014-11-16 ENCOUNTER — Other Ambulatory Visit: Payer: Self-pay | Admitting: *Deleted

## 2014-11-16 NOTE — Telephone Encounter (Signed)
Could we do 3? 

## 2014-11-17 ENCOUNTER — Encounter: Payer: Self-pay | Admitting: Obstetrics & Gynecology

## 2014-11-17 ENCOUNTER — Other Ambulatory Visit (HOSPITAL_COMMUNITY)
Admission: RE | Admit: 2014-11-17 | Discharge: 2014-11-17 | Disposition: A | Discharge status: Home / Self Care | Payer: Commercial Managed Care - HMO | Source: Ambulatory Visit | Attending: Obstetrics & Gynecology | Admitting: Obstetrics & Gynecology

## 2014-11-17 ENCOUNTER — Ambulatory Visit (INDEPENDENT_AMBULATORY_CARE_PROVIDER_SITE_OTHER): Payer: Commercial Managed Care - HMO | Admitting: Obstetrics & Gynecology

## 2014-11-17 VITALS — BP 136/82 | HR 79 | Ht 62.0 in | Wt 204.0 lb

## 2014-11-17 DIAGNOSIS — Z124 Encounter for screening for malignant neoplasm of cervix: Secondary | ICD-10-CM | POA: Diagnosis present

## 2014-11-17 DIAGNOSIS — Z0000 Encounter for general adult medical examination without abnormal findings: Secondary | ICD-10-CM

## 2014-11-17 DIAGNOSIS — Z1151 Encounter for screening for human papillomavirus (HPV): Secondary | ICD-10-CM | POA: Insufficient documentation

## 2014-11-17 DIAGNOSIS — L293 Anogenital pruritus, unspecified: Secondary | ICD-10-CM

## 2014-11-17 DIAGNOSIS — N3941 Urge incontinence: Secondary | ICD-10-CM

## 2014-11-17 MED ORDER — ESTRADIOL 0.1 MG/GM VA CREA: TOPICAL_CREAM | VAGINAL | Status: DC

## 2014-11-17 NOTE — Progress Notes (Signed)
   Subjective:    Patient ID: Mary Gray, female    DOB: [birth year], 53 y.o.   MRN: [id number]  HPI  53 yo SW G0 here today for vaginal irritation. She was seen by Dr. Merrily Brittleahlstaed for evaluation of urge incontinence. She has been seen there for at least "8 or 9 years).  She was seen at the ED for the irritation as well. She has been given monistat, diflucan, and estrogen cream. She used the estrogen cream "until it ran out". She did not get any refills. She has been seen by Dr. Coral Ceoharles Harper for many years. She reports that she has not had a pap for "7 years". She wears adult diapers and has daily wetting accidents. She tells me that she did well on the Vesicare in the past.  Review of Systems Last mammogram more than a year.    Objective:   Physical Exam Wet, hypopigmented vulva. The labia majora are erythematous, rash down around anus c/w constant exposure to urine       Assessment & Plan:  Incontinence- She will be seeing Dr. Carlyle Dollyahstead in the near future. She will use A&D ointment all over vulva/perianal area every morning and vaginal estrogen every M,W, F at night If she does not have persistent improvement of her vulvar skin by next month, I will do a vulvar biopsy. Preventative care- I have ordered a mammogram and sent a pap smear We had a long discussion about the importance of her responsibility in her health. I have discussed healthy eating habits. She agrees to try to stick with this and lose weight. RTC 4-6 weeks

## 2014-11-17 NOTE — Progress Notes (Signed)
[facility] vulvar atrophy, urinary incontinence.  Patient has not had pap smear in 7 years.  Patient has to wear a pad due to incontinence problems.

## 2014-11-18 ENCOUNTER — Other Ambulatory Visit: Payer: Self-pay | Admitting: Podiatrist

## 2014-11-18 ENCOUNTER — Other Ambulatory Visit: Payer: Self-pay | Admitting: Internal Medicine

## 2014-11-18 LAB — WET PREP, GENITAL
Clue Cells Wet Prep HPF POC: NONE SEEN
Trich, Wet Prep: NONE SEEN
WBC WET PREP: NONE SEEN

## 2014-11-18 MED ORDER — HYDROMORPHONE HCL 2 MG PO TABS: 2.0000 mg | ORAL_TABLET | Freq: Four times a day (QID) | ORAL | Status: DC | PRN

## 2014-11-18 NOTE — Telephone Encounter (Signed)
Pt called

## 2014-11-18 NOTE — Telephone Encounter (Addendum)
Refill printed and signed - nurse to complete. 

## 2014-11-19 ENCOUNTER — Other Ambulatory Visit: Payer: Self-pay | Admitting: Internal Medicine

## 2014-11-19 LAB — CYTOLOGY - PAP

## 2014-11-23 NOTE — Telephone Encounter (Signed)
Has Feb appt with PCP 

## 2014-11-23 NOTE — Telephone Encounter (Signed)
Patient's pcp unavailable-refill request sent to [facility] attending pool. Criss Alvine, Axil Copeman Cassady1/4/20169:23 AM

## 2014-11-24 ENCOUNTER — Telehealth: Payer: Self-pay | Admitting: *Deleted

## 2014-11-24 DIAGNOSIS — Z780 Asymptomatic menopausal state: Secondary | ICD-10-CM

## 2014-11-24 MED ORDER — ESTRADIOL 0.1 MG/GM VA CREA: TOPICAL_CREAM | VAGINAL | Status: DC

## 2014-11-24 NOTE — Telephone Encounter (Signed)
Pt called and needed refills on her Estrace vaginal cream.  I have sent in refills.

## 2014-11-30 DIAGNOSIS — E119 Type 2 diabetes mellitus without complications: Secondary | ICD-10-CM | POA: Diagnosis not present

## 2014-12-01 ENCOUNTER — Ambulatory Visit: Payer: Commercial Managed Care - HMO | Admitting: Internal Medicine

## 2014-12-03 ENCOUNTER — Encounter: Payer: Self-pay | Admitting: Internal Medicine

## 2014-12-03 ENCOUNTER — Ambulatory Visit (INDEPENDENT_AMBULATORY_CARE_PROVIDER_SITE_OTHER): Payer: Commercial Managed Care - HMO | Admitting: Internal Medicine

## 2014-12-03 VITALS — BP 133/71 | HR 80 | Temp 97.8°F | Ht 63.0 in | Wt 201.4 lb

## 2014-12-03 DIAGNOSIS — E119 Type 2 diabetes mellitus without complications: Secondary | ICD-10-CM

## 2014-12-03 DIAGNOSIS — E7800 Pure hypercholesterolemia, unspecified: Secondary | ICD-10-CM

## 2014-12-03 DIAGNOSIS — N3941 Urge incontinence: Secondary | ICD-10-CM | POA: Diagnosis not present

## 2014-12-03 DIAGNOSIS — I10 Essential (primary) hypertension: Secondary | ICD-10-CM

## 2014-12-03 DIAGNOSIS — M5440 Lumbago with sciatica, unspecified side: Secondary | ICD-10-CM | POA: Diagnosis not present

## 2014-12-03 LAB — COMPLETE METABOLIC PANEL WITH GFR
ALK PHOS: 63 U/L (ref 39–117)
ALT: 12 U/L (ref 0–35)
AST: 14 U/L (ref 0–37)
Albumin: 3.8 g/dL (ref 3.5–5.2)
BUN: 14 mg/dL (ref 6–23)
CO2: 29 mEq/L (ref 19–32)
Calcium: 8.8 mg/dL (ref 8.4–10.5)
Chloride: 102 mEq/L (ref 96–112)
Creat: 0.74 mg/dL (ref 0.50–1.10)
GLUCOSE: 102 mg/dL — AB (ref 70–99)
Potassium: 4.1 mEq/L (ref 3.5–5.3)
SODIUM: 138 meq/L (ref 135–145)
Total Bilirubin: 0.4 mg/dL (ref 0.2–1.2)
Total Protein: 6.3 g/dL (ref 6.0–8.3)

## 2014-12-03 LAB — CBC WITH DIFFERENTIAL/PLATELET
Basophils Absolute: 0.1 10*3/uL (ref 0.0–0.1)
Basophils Relative: 1 % (ref 0–1)
EOS PCT: 5 % (ref 0–5)
Eosinophils Absolute: 0.4 10*3/uL (ref 0.0–0.7)
HEMATOCRIT: 35.6 % — AB (ref 36.0–46.0)
Hemoglobin: 11.9 g/dL — ABNORMAL LOW (ref 12.0–15.0)
LYMPHS ABS: 3 10*3/uL (ref 0.7–4.0)
LYMPHS PCT: 34 % (ref 12–46)
MCH: 29.2 pg (ref 26.0–34.0)
MCHC: 33.4 g/dL (ref 30.0–36.0)
MCV: 87.5 fL (ref 78.0–100.0)
MPV: 9.3 fL (ref 8.6–12.4)
Monocytes Absolute: 0.4 10*3/uL (ref 0.1–1.0)
Monocytes Relative: 5 % (ref 3–12)
Neutro Abs: 4.8 10*3/uL (ref 1.7–7.7)
Neutrophils Relative %: 55 % (ref 43–77)
Platelets: 214 10*3/uL (ref 150–400)
RBC: 4.07 MIL/uL (ref 3.87–5.11)
RDW: 14.6 % (ref 11.5–15.5)
WBC: 8.7 10*3/uL (ref 4.0–10.5)

## 2014-12-03 LAB — LIPID PANEL
Cholesterol: 184 mg/dL (ref 0–200)
HDL: 36 mg/dL — AB (ref >39)
LDL CALC: 100 mg/dL — AB (ref 0–99)
TRIGLYCERIDES: 241 mg/dL — AB (ref <150)
Total CHOL/HDL Ratio: 5.1 Ratio
VLDL: 48 mg/dL — AB (ref 0–40)

## 2014-12-03 LAB — GLUCOSE, CAPILLARY: Glucose-Capillary: 128 mg/dL — ABNORMAL HIGH (ref 70–99)

## 2014-12-03 MED ORDER — LEVOFLOXACIN 500 MG PO TABS: 500.0000 mg | ORAL_TABLET | Freq: Every day | ORAL | Status: DC

## 2014-12-03 MED ORDER — FLUCONAZOLE 150 MG PO TABS: 150.0000 mg | ORAL_TABLET | Freq: Once | ORAL | Status: DC

## 2014-12-03 NOTE — Assessment & Plan Note (Signed)
Assessment: Patient is doing well without apparent side effects on rosuvastatin 10 mg daily.  Plan: Check lipid panel today; continue rosuvastatin 10 mg daily pending the result.

## 2014-12-03 NOTE — Progress Notes (Signed)
   Subjective:    Patient ID: Mary Gray, female    DOB: December 01, 1960, 54 y.o.   MRN: [id number]  HPI Patient returns for management of her chronic low back pain secondary to lumbar spinal stenosis, hypertension, type 2 diabetes mellitus, hypercholesterolemia, and other chronic problems.  Her main complaint today is recently worsened urge urinary incontinence, with frequent urination and with erythema/inflammation of her external vaginal area.  She also complains of bilateral leg erythema and tenderness, left greater than right, which is worse over the past couple of weeks.  Her chronic low back pain is stable and reasonably well-controlled on her current regimen of tramadol and oral hydromorphone.  She reports no apparent adverse effects of her pain medication regimen, and feels that it improves her functional status.  Her blood sugars have been well controlled, with an average value in the 130s.   Review of Systems  Constitutional: Negative for fever, chills and diaphoresis.  Respiratory: Negative for shortness of breath.   Cardiovascular: Negative for chest pain.  Gastrointestinal: Negative for nausea, vomiting and abdominal pain.  Genitourinary: Positive for dysuria, urgency and frequency.  Musculoskeletal: Positive for back pain (Chronic).       Objective:   Physical Exam  Constitutional: She appears distressed.  Cardiovascular: Normal rate, regular rhythm and normal heart sounds.  Exam reveals no gallop and no friction rub.   No murmur heard. No leg edema  Pulmonary/Chest: Effort normal and breath sounds normal. No respiratory distress. She has no wheezes. She has no rales.  Abdominal: Soft. Bowel sounds are normal. She exhibits no distension. There is no tenderness. There is no rebound and no guarding.  Genitourinary: There is rash (Erythematous rash consistent with Candidal vulvovaginitis) on the right labia. There is rash on the left labia.  External inspection was performed with  the assistance of M. Hague.  Skin:          Assessment & Plan:

## 2014-12-03 NOTE — Assessment & Plan Note (Signed)
Assessment: Patient has chronic low back pain with sciatica due to lumbar spinal stenosis and lumbar disc disease followed by neurosurgeon Dr. Lovell SheehanJenkins.  Her pain is reasonably well controlled on tramadol 50 mg every 6 hours as needed for pain and hydromorphone 2 mg every 6 hours as needed for severe pain.  Control of her pain with this regimen allows better functioning, and she reports no apparent side effects from the medication.  Plan: Continue tramadol supplemented by hydromorphone as needed; follow up with neurosurgeon Dr. Lovell SheehanJenkins.

## 2014-12-03 NOTE — Assessment & Plan Note (Addendum)
Assessment: Patient presents with worsening urge incontinence of urine.  Exam shows erythematous rash of her vulvar area consistent with Candidal vulvovaginitis.  Patient was seen by Dr. Marice Potterove in the GYN clinic in December and started on a regimen of A&D ointment to protect from urinary wetting and Estrace vaginal cream for vulvar atrophy; she apparently is considering a biopsy when patient returns.    Plan: Send urine for urinalysis and urine culture given her recently worsened urge incontinence; check CBC with differential; treat for possible candidal vulvovaginitis with fluconazole 150 mg tablet 1 dose; I advised patient to continue the topical treatment as prescribed by Dr. Marice Potterove, and to follow-up with Dr. Marice Potterove in the GYN clinic as scheduled next week.  She has what appears to be cellulitis of her legs, left greater than right, which I will treat with levofloxacin given her penicillin allergy; this will cover for bacterial UTI if present as well.

## 2014-12-03 NOTE — Assessment & Plan Note (Signed)
Lab Results  Component Value Date   HGBA1C 6.2 09/29/2014   HGBA1C 6.1 05/20/2014   HGBA1C 6.3 03/03/2014     Assessment: Diabetes control: good control (HgbA1C at goal) Progress toward A1C goal:  at goal Comments: Hemoglobin A1c is at goal on metformin XR 500 mg twice a day  Plan: Medications:  continue current medications Home glucose monitoring: Frequency: once a day Timing: before breakfast Instruction/counseling given: reminded to get eye exam Other plans: Check CMP today.

## 2014-12-03 NOTE — Assessment & Plan Note (Signed)
BP Readings from Last 3 Encounters:  12/03/14 133/71  11/17/14 136/82  11/10/14 122/85    Lab Results  Component Value Date   NA 138 12/03/2014   K 4.1 12/03/2014   CREATININE 0.74 12/03/2014    Assessment: Blood pressure control: controlled Progress toward BP goal:  at goal Comments: Blood pressure is at goal on enalapril 2.5 mg daily and furosemide 20 mg every other day (patient is currently not taking the furosemide due to urinary incontinence.)  Plan: Medications:  continue current medications

## 2014-12-03 NOTE — Patient Instructions (Addendum)
Please follow-up with your urologist next week as scheduled. Take fluconazole 150 mg one dose only for vaginal yeast infection. Take levofloxacin 500 mg 1 tablet daily for 7 days for urinary tract infection and cellulitis. Do not take erythromycin for the next week while you are taking levofloxacin.

## 2014-12-04 LAB — URINALYSIS, ROUTINE W REFLEX MICROSCOPIC
Bilirubin Urine: NEGATIVE
GLUCOSE, UA: NEGATIVE mg/dL
HGB URINE DIPSTICK: NEGATIVE
KETONES UR: NEGATIVE mg/dL
LEUKOCYTES UA: NEGATIVE
Nitrite: NEGATIVE
PH: 6 (ref 5.0–8.0)
PROTEIN: NEGATIVE mg/dL
SPECIFIC GRAVITY, URINE: 1.028 (ref 1.005–1.030)
Urobilinogen, UA: 0.2 mg/dL (ref 0.0–1.0)

## 2014-12-05 LAB — URINE CULTURE
Colony Count: NO GROWTH
Organism ID, Bacteria: NO GROWTH

## 2014-12-09 ENCOUNTER — Ambulatory Visit (HOSPITAL_COMMUNITY): Payer: Commercial Managed Care - HMO

## 2014-12-10 ENCOUNTER — Telehealth: Payer: Self-pay | Admitting: *Deleted

## 2014-12-10 DIAGNOSIS — M5440 Lumbago with sciatica, unspecified side: Secondary | ICD-10-CM

## 2014-12-10 DIAGNOSIS — N3941 Urge incontinence: Secondary | ICD-10-CM

## 2014-12-10 DIAGNOSIS — M48061 Spinal stenosis, lumbar region without neurogenic claudication: Secondary | ICD-10-CM

## 2014-12-10 NOTE — Telephone Encounter (Signed)
Pt called in asking about getting a Bedside Commode, Shower chair and pull-ups. If you can enter the DME order in EPIC and send to [place] she will get these ordered.   Pt not able to use [facility] because of insurance.

## 2014-12-11 ENCOUNTER — Ambulatory Visit: Payer: Commercial Managed Care - HMO | Admitting: Obstetrics & Gynecology

## 2014-12-16 NOTE — Telephone Encounter (Signed)
I entered the order for bedside commode and shower chair.  I don't know how to order the pull ups in Epic - please advise.

## 2014-12-17 ENCOUNTER — Other Ambulatory Visit: Payer: Self-pay | Admitting: Internal Medicine

## 2014-12-17 DIAGNOSIS — N3941 Urge incontinence: Secondary | ICD-10-CM

## 2014-12-17 DIAGNOSIS — L719 Rosacea, unspecified: Secondary | ICD-10-CM

## 2014-12-17 NOTE — Progress Notes (Signed)
I entered referral orders as requested to patient's urologist Dr. Retta Dionesahlstedt and her dermatologist Dr. Terri PiedraLupton.

## 2014-12-19 ENCOUNTER — Encounter (HOSPITAL_COMMUNITY): Payer: Self-pay | Admitting: Emergency Medicine

## 2014-12-19 ENCOUNTER — Emergency Department (HOSPITAL_COMMUNITY): Payer: Commercial Managed Care - HMO

## 2014-12-19 ENCOUNTER — Emergency Department (HOSPITAL_COMMUNITY)
Admission: EM | Admit: 2014-12-19 | Discharge: 2014-12-19 | Disposition: A | Discharge status: Home / Self Care | Payer: Commercial Managed Care - HMO | Attending: Emergency Medicine | Admitting: Emergency Medicine

## 2014-12-19 ENCOUNTER — Telehealth: Payer: Self-pay | Admitting: Internal Medicine

## 2014-12-19 DIAGNOSIS — F411 Generalized anxiety disorder: Secondary | ICD-10-CM | POA: Insufficient documentation

## 2014-12-19 DIAGNOSIS — Z79899 Other long term (current) drug therapy: Secondary | ICD-10-CM | POA: Insufficient documentation

## 2014-12-19 DIAGNOSIS — M25512 Pain in left shoulder: Secondary | ICD-10-CM | POA: Diagnosis not present

## 2014-12-19 DIAGNOSIS — I10 Essential (primary) hypertension: Secondary | ICD-10-CM | POA: Insufficient documentation

## 2014-12-19 DIAGNOSIS — W000XXA Fall on same level due to ice and snow, initial encounter: Secondary | ICD-10-CM | POA: Insufficient documentation

## 2014-12-19 DIAGNOSIS — Y9301 Activity, walking, marching and hiking: Secondary | ICD-10-CM | POA: Diagnosis not present

## 2014-12-19 DIAGNOSIS — Z8701 Personal history of pneumonia (recurrent): Secondary | ICD-10-CM | POA: Diagnosis not present

## 2014-12-19 DIAGNOSIS — S4991XA Unspecified injury of right shoulder and upper arm, initial encounter: Secondary | ICD-10-CM | POA: Diagnosis not present

## 2014-12-19 DIAGNOSIS — Z8751 Personal history of pre-term labor: Secondary | ICD-10-CM | POA: Insufficient documentation

## 2014-12-19 DIAGNOSIS — Z862 Personal history of diseases of the blood and blood-forming organs and certain disorders involving the immune mechanism: Secondary | ICD-10-CM | POA: Insufficient documentation

## 2014-12-19 DIAGNOSIS — E780 Pure hypercholesterolemia: Secondary | ICD-10-CM | POA: Diagnosis not present

## 2014-12-19 DIAGNOSIS — Z872 Personal history of diseases of the skin and subcutaneous tissue: Secondary | ICD-10-CM | POA: Diagnosis not present

## 2014-12-19 DIAGNOSIS — I499 Cardiac arrhythmia, unspecified: Secondary | ICD-10-CM | POA: Insufficient documentation

## 2014-12-19 DIAGNOSIS — M5440 Lumbago with sciatica, unspecified side: Secondary | ICD-10-CM | POA: Insufficient documentation

## 2014-12-19 DIAGNOSIS — E119 Type 2 diabetes mellitus without complications: Secondary | ICD-10-CM | POA: Diagnosis not present

## 2014-12-19 DIAGNOSIS — G8929 Other chronic pain: Secondary | ICD-10-CM | POA: Insufficient documentation

## 2014-12-19 DIAGNOSIS — K219 Gastro-esophageal reflux disease without esophagitis: Secondary | ICD-10-CM | POA: Insufficient documentation

## 2014-12-19 DIAGNOSIS — F329 Major depressive disorder, single episode, unspecified: Secondary | ICD-10-CM | POA: Insufficient documentation

## 2014-12-19 DIAGNOSIS — Y998 Other external cause status: Secondary | ICD-10-CM | POA: Insufficient documentation

## 2014-12-19 DIAGNOSIS — M545 Low back pain: Secondary | ICD-10-CM | POA: Diagnosis not present

## 2014-12-19 DIAGNOSIS — E039 Hypothyroidism, unspecified: Secondary | ICD-10-CM | POA: Insufficient documentation

## 2014-12-19 DIAGNOSIS — M25511 Pain in right shoulder: Secondary | ICD-10-CM | POA: Diagnosis not present

## 2014-12-19 DIAGNOSIS — Z87891 Personal history of nicotine dependence: Secondary | ICD-10-CM | POA: Diagnosis not present

## 2014-12-19 DIAGNOSIS — S3992XA Unspecified injury of lower back, initial encounter: Secondary | ICD-10-CM | POA: Diagnosis not present

## 2014-12-19 DIAGNOSIS — S79911A Unspecified injury of right hip, initial encounter: Secondary | ICD-10-CM | POA: Insufficient documentation

## 2014-12-19 DIAGNOSIS — Y92009 Unspecified place in unspecified non-institutional (private) residence as the place of occurrence of the external cause: Secondary | ICD-10-CM | POA: Insufficient documentation

## 2014-12-19 DIAGNOSIS — J449 Chronic obstructive pulmonary disease, unspecified: Secondary | ICD-10-CM | POA: Insufficient documentation

## 2014-12-19 DIAGNOSIS — E669 Obesity, unspecified: Secondary | ICD-10-CM | POA: Insufficient documentation

## 2014-12-19 DIAGNOSIS — Z792 Long term (current) use of antibiotics: Secondary | ICD-10-CM | POA: Insufficient documentation

## 2014-12-19 DIAGNOSIS — Z880 Allergy status to penicillin: Secondary | ICD-10-CM | POA: Diagnosis not present

## 2014-12-19 DIAGNOSIS — W19XXXA Unspecified fall, initial encounter: Secondary | ICD-10-CM

## 2014-12-19 DIAGNOSIS — S0990XA Unspecified injury of head, initial encounter: Secondary | ICD-10-CM | POA: Insufficient documentation

## 2014-12-19 DIAGNOSIS — Z8742 Personal history of other diseases of the female genital tract: Secondary | ICD-10-CM | POA: Diagnosis not present

## 2014-12-19 MED ORDER — HYDROMORPHONE HCL 1 MG/ML IJ SOLN
1.0000 mg | Freq: Once | INTRAMUSCULAR | Status: AC
  Administered 2014-12-19: 1 mg via INTRAMUSCULAR
  Filled 2014-12-19: qty 1

## 2014-12-19 NOTE — ED Notes (Signed)
Pt. Stated, I went out this morning to take out dog and I fell on a piece of ice.  Im hurting in my back, arm,head,hip(right)

## 2014-12-19 NOTE — Discharge Instructions (Signed)
RICE: Routine Care for Injuries The routine care of many injuries includes Rest, Ice, Compression, and Elevation (RICE). HOME CARE INSTRUCTIONS  Rest is needed to allow your body to heal. Routine activities can usually be resumed when comfortable. Injured tendons and bones can take up to 6 weeks to heal. Tendons are the cord-like structures that attach muscle to bone.  Ice following an injury helps keep the swelling down and reduces pain.  Put ice in a plastic bag.  Place a towel between your skin and the bag.  Leave the ice on for 15-20 minutes, 3-4 times a day, or as directed by your health care provider. Do this while awake, for the first 24 to 48 hours. After that, continue as directed by your caregiver.  Compression helps keep swelling down. It also gives support and helps with discomfort. If an elastic bandage has been applied, it should be removed and reapplied every 3 to 4 hours. It should not be applied tightly, but firmly enough to keep swelling down. Watch fingers or toes for swelling, bluish discoloration, coldness, numbness, or excessive pain. If any of these problems occur, remove the bandage and reapply loosely. Contact your caregiver if these problems continue.  Elevation helps reduce swelling and decreases pain. With extremities, such as the arms, hands, legs, and feet, the injured area should be placed near or above the level of the heart, if possible. SEEK IMMEDIATE MEDICAL CARE IF:  You have persistent pain and swelling.  You develop redness, numbness, or unexpected weakness.  Your symptoms are getting worse rather than improving after several days. These symptoms may indicate that further evaluation or further X-rays are needed. Sometimes, X-rays may not show a small broken bone (fracture) until 1 week or 10 days later. Make a follow-up appointment with your caregiver. Ask when your X-ray results will be ready. Make sure you get your X-ray results. Document Released:  02/18/2001 Document Revised: 11/11/2013 Document Reviewed: 04/07/2011 ExitCare Patient Information 2015 [employer]. This information is not intended to replace advice given to you by your health care provider. Make sure you discuss any questions you have with your health care provider.  

## 2014-12-19 NOTE — Telephone Encounter (Signed)
  INTERNAL MEDICINE RESIDENCY PROGRAM After-Hours Telephone Call    Reason for call:   I received a call from [name] at 10:46 AM, 12/19/2014 indicating she had a recent fall on some ice and is having continued pain in her back.    Pertinent Data:   States she slipped and fell on some ice on her ramp at her house and fell and hurt her right shoulder, arm, hip, and also states she hit her head.   States she is having some tingling in her finger tips since her fall, headaches, and possibly some confusion.   This AM, states she has taken Dilaudid 2 mg once, Soma 350 mg once, and Tramadol 50 mg once and says she is still having severe back pain.   Says she is still able to walk with her cane but is having increased difficulty 2/2 pain.    Assessment / Plan / Recommendations:   Given that the patient has had supposed head trauma, advised her tom come to the ED for close examination as well as possible CT head and XR of her spine if deemed medically necessary.     Courtney ParisEden W Shylynn Bruning, MD   12/19/2014, 10:46 AM

## 2014-12-19 NOTE — ED Provider Notes (Signed)
CSN: [id number]     Arrival date & time 12/19/14  1148 History   First MD Initiated Contact with Patient 12/19/14 1203     Chief Complaint  Patient presents with  . Fall  . Back Pain  . Hip Pain  . Arm Injury  . Head Injury     (Consider location/radiation/quality/duration/timing/severity/associated sxs/prior Treatment) HPI   54 year old obese female with history of non-insulin-dependent diabetes, chronic back pain, spinal stenosis, iron deficiency anemia, anxiety presents for evaluation of a fall. Patient report last night when walking home she accidentally slipped on a patch of ice, fell backward striking her low back her head and her right arm against the ground. She denies any loss of consciousness but has been complaining of sharp throbbing pain primarily to her low back and her right hip. Pain is described as a sharp sensation nonradiating, not improve with her home medication including tramadol and Dilaudid. She denies any precipitating symptoms prior to fall. She denies any new numbness or weakness. She denies any loss of consciousness or severe headache at this time. She is not on blood thinner medication. Patient uses a walker to walk and have been able to bear weight with some difficulty.  Past Medical History  Diagnosis Date  . Diabetes mellitus, type 2   . Hypertension   . Unspecified asthma(493.90)   . Anxiety state, unspecified   . Hypercholesteremia   . Hypothyroidism   . Obesity   . GERD (gastroesophageal reflux disease)   . Iron deficiency anemia   . DDD (degenerative disc disease), lumbar   . Spinal stenosis   . Low back pain   . Degeneration of cervical intervertebral disc   . Left knee pain     Following fall 11/15/2009.  Marland Kitchen Acne rosacea   . Heme positive stool     EGD on 04/08/2010 by Dr. Watt Climes showed chronic gastritis and a few gastric polyps; pathology showed a fundic gland polyp.  Colonoscopy on 04/08/2010 showed small external and internal hemorrhoids,  and a few benign-appearing diminutive polyps in the rectum, the distal sigmoid colon, and in the distal descending colon; pathology showed hyperplastic polyps.   . Hematochezia   . Irregular menses   . Vagina bleeding   . Atrophic vaginitis   . Viral warts, unspecified     Right hand.  . Chronic interstitial cystitis     Followed by Dr. Franchot Gallo  . Elevated transaminase level     Mild elevation, AST=48, ALT=51 on 02/01/07.  . Allergic rhinitis   . Carpal tunnel syndrome   . Atypical chest pain 12/11/2008    Normal stress nuclear study 01/12/2009 by Dr. Peter Martinique  . Bilateral leg edema   . COPD (chronic obstructive pulmonary disease)   . Dysrhythmia   . Depression   . Shortness of breath   . Pneumonia   . ZPHXTAVW(979.4)    Past Surgical History  Procedure Laterality Date  . Carpal tunnel release  05/08/2000    By Dr. Youlanda Mighty. Sypher, Brooke Bonito.   Family History  Problem Relation Age of Onset  . Heart attack Father 49  . Breast cancer Neg Hx   . Colon cancer Neg Hx   . Diabetes Mother   . Hypertension Mother   . Diabetes Sister    History  Substance Use Topics  . Smoking status: Former Smoker -- 1.00 packs/day for 15 years    Types: Cigarettes    Quit date: 01/18/1996  . Smokeless tobacco: Never Used  .  Alcohol Use: No   OB History    No data available     Review of Systems  Constitutional: Negative for fever.  Musculoskeletal: Positive for back pain.  Skin: Negative for rash and wound.  Neurological: Negative for headaches.      Allergies  Codeine sulfate; Meperidine and related; Minocycline hcl; Penicillins; Propoxyphene n-acetaminophen; and Tetracycline hcl  Home Medications   Prior to Admission medications   Medication Sig Start Date End Date Taking? Authorizing Provider  albuterol (PROAIR HFA) 108 (90 BASE) MCG/ACT inhaler Inhale 2 puffs into the lungs every 6 (six) hours as needed for wheezing or shortness of breath. 05/25/14   Axel Filler,  MD  ALPRAZolam Duanne Moron) 1 MG tablet Take 1 mg by mouth 4 (four) times daily as needed for anxiety.    Historical Provider, MD  amitriptyline (ELAVIL) 25 MG tablet Take 1 tablet (25 mg total) by mouth at bedtime. 08/27/14   Axel Filler, MD  Blood Glucose Monitoring Suppl (ACCU-CHEK COMPACT CARE KIT) KIT Use to test blood glucose one time daily. Dx:250.00 03/18/14   Axel Filler, MD  budesonide-formoterol [facility]) 160-4.5 MCG/ACT inhaler Inhale 2 puffs into the lungs 2 (two) times daily.    Historical Provider, MD  carisoprodol (SOMA) 350 MG tablet Take 1 tablet (350 mg total) by mouth 3 (three) times daily as needed for muscle spasms. 10/06/14   Axel Filler, MD  enalapril (VASOTEC) 5 MG tablet Take 0.5 tablets (2.5 mg total) by mouth daily. 09/28/14   Axel Filler, MD  erythromycin ethylsuccinate (EES) 400 MG tablet Take 400 mg by mouth 2 (two) times daily. Maintenance for Rosacea    Historical Provider, MD  esomeprazole (NEXIUM) 40 MG capsule Take 1 capsule (40 mg total) by mouth daily before breakfast. 07/06/14   Axel Filler, MD  estradiol (ESTRACE) 0.1 MG/GM vaginal cream Apply 1 gram per vagina every night for 2 weeks, then apply three times a week Patient not taking: Reported on 12/03/2014 11/24/14   Donnamae Jude, MD  fluconazole (DIFLUCAN) 150 MG tablet Take 1 tablet (150 mg total) by mouth once. 12/03/14   Axel Filler, MD  FLUoxetine (PROZAC) 20 MG capsule Take 20 mg by mouth daily.  11/15/13   Historical Provider, MD  fluticasone (FLONASE) 50 MCG/ACT nasal spray Place 2 sprays into both nostrils daily.    Historical Provider, MD  furosemide (LASIX) 20 MG tablet TAKE 1 TABLET BY MOUTH EVERY OTHER DAY Patient not taking: Reported on 12/03/2014 11/23/14   Bartholomew Crews, MD  glucose blood (ACCU-CHEK COMPACT STRIPS) test strip Use as instructed to check blood sugar once daily.  Dx: ICD-9 250.00. 10/23/14   Axel Filler, MD  HYDROmorphone (DILAUDID) 2 MG tablet  Take 1 tablet (2 mg total) by mouth every 6 (six) hours as needed for severe pain. 11/18/14   Axel Filler, MD  ipratropium (ATROVENT HFA) 17 MCG/ACT inhaler Inhale 2 puffs into the lungs 4 (four) times daily as needed for wheezing. 10/09/14   Axel Filler, MD  KRISTALOSE 20 G packet Take 20 g by mouth 2 (two) times daily as needed (constipation).  05/13/12   Historical Provider, MD  levofloxacin (LEVAQUIN) 500 MG tablet Take 1 tablet (500 mg total) by mouth daily. 12/03/14   Axel Filler, MD  metFORMIN (GLUCOPHAGE-XR) 500 MG 24 hr tablet Take 1 tablet (500 mg total) by mouth 2 (two) times daily. 09/28/14   Axel Filler, MD  nystatin (MYCOSTATIN) powder Apply topically two times a day to affected areas of skin for 10 days. Patient not taking: Reported on 12/03/2014 06/25/13   Axel Filler, MD  potassium chloride (KLOR-CON M10) 10 MEQ tablet Take 2 tablets (20 mEq total) by mouth daily. 10/26/14   Axel Filler, MD  pregabalin (LYRICA) 75 MG capsule Take 75 mg by mouth 2 (two) times daily.    Historical Provider, MD  rOPINIRole (REQUIP) 4 MG tablet TAKE 1 TABLET BY MOUTH EVERY DAY AT BEDTIME 11/19/14   Bronson Ing, DPM  rosuvastatin (CRESTOR) 10 MG tablet Take 1 tablet (10 mg total) by mouth daily. 11/04/14   Axel Filler, MD  SYNTHROID 75 MCG tablet Take 1 tablet (75 mcg total) by mouth daily. 04/30/14   Axel Filler, MD  traMADol (ULTRAM) 50 MG tablet Take 1 tablet (50 mg total) by mouth every 6 (six) hours as needed (for pain). 07/06/14   Axel Filler, MD  zolpidem (AMBIEN) 10 MG tablet Take 5 mg by mouth at bedtime as needed for sleep.     Historical Provider, MD   BP 141/75 mmHg  Pulse 89  Temp(Src) 98.1 F (36.7 C) (Oral)  Resp 18  Ht 5' 1"  (1.549 m)  Wt 195 lb (88.451 kg)  BMI 36.86 kg/m2  SpO2 99%  LMP 01/17/2009 Physical Exam  Constitutional: She is oriented to person, place, and time. She appears well-developed and well-nourished. No  distress.  Moderately obese Caucasian female sitting in at the edge of the bed, in no acute distress.  HENT:  Head: Atraumatic.  No signs of head injury or scalp injury  Eyes: Conjunctivae are normal.  Neck: Neck supple.  Abdominal: Soft. There is no tenderness.  Musculoskeletal: She exhibits tenderness (Tenderness to lumbar paralumbar spinal muscle on palpation without any overlying skin changes, obvious signs of trauma. Tenderness along the lateral aspects of right hip with normal hip flexion and extension. Mild tenderness to Palm of right hand ).  Neurological: She is alert and oriented to person, place, and time.  Skin: No rash noted.  Psychiatric: She has a normal mood and affect.  Nursing note and vitals reviewed.   ED Course  Procedures (including critical care time)  Patient had a mechanical fall injuring her low back and her right hip. She is able to ambulate. Will x-ray her low back.  1:09 PM X-ray of her low back shows no evidence of acute fracture or dislocation. Reassurance given. Patient has pain medication at home which I encourage rice therapy and taking pain medication as needed. Orthopedic referral given as needed.  1:15 PM Patient currently complaining of right shoulder pain from the fall as well. Patient states she thinks she may have dislocated her shoulder. On range of motion she has full range of motion but does have some tenderness to the posterior deltoid without any crepitus or deformity noted. Sensation is intact distally. X-ray obtained.  2:38 PM X-ray of right shoulder shows no acute fracture or dislocation. Reassurance given. Rise therapy discussed. Patient has pain medication at home which she can continue to take. Pain medication given here. Otherwise patient is stable for discharge.  Labs Review Labs Reviewed - No data to display  Imaging Review Dg Lumbar Spine Complete  12/19/2014   CLINICAL DATA:  54 year old female with history of trauma from a  fall complaining of pain in the lower lumbar region radiating through the coccyx.  EXAM: LUMBAR SPINE - COMPLETE 4+ VIEW  COMPARISON:  09/15/2014.  FINDINGS: Five views of the lumbar spine demonstrate no acute displaced fractures or definite compression type fractures. Alignment is anatomic. Multilevel degenerative disc disease and extensive multilevel facet arthropathy, most severe at L5-S1. No definite defects of the pars interarticularis are noted. Extensive anterior flowing osteophytes are noted in the upper lumbar and lower thoracic spine, suggestive of underlying diffuse idiopathic skeletal hyperostosis (DISH). Numerous arterial calcifications are noted.  IMPRESSION: 1. No acute radiographic abnormality of the lumbar spine. 2. Multilevel degenerative disc disease and lumbar spondylosis redemonstrated. 3. DISH. 4. Atherosclerosis.   Electronically Signed   By: Vinnie Langton M.D.   On: 12/19/2014 12:57   Dg Shoulder Right  12/19/2014   CLINICAL DATA:  Acute right shoulder pain after slipping on ice today. Initial encounter.  EXAM: RIGHT SHOULDER - 2+ VIEW  COMPARISON:  March 03, 2014.  FINDINGS: There is no evidence of fracture or dislocation. Stable degenerative changes seen involving the right acromioclavicular joint. Soft tissues are unremarkable.  IMPRESSION: Stable degenerative change of the right acromioclavicular joint. No fracture or dislocation is noted.   Electronically Signed   By: Sabino Dick M.D.   On: 12/19/2014 14:10     EKG Interpretation None      MDM   Final diagnoses:  Fall  Right shoulder pain  Low back pain, unspecified back pain laterality, with sciatica presence unspecified    BP 125/66 mmHg  Pulse 72  Temp(Src) 98.1 F (36.7 C) (Oral)  Resp 15  Ht 5' 1"  (1.549 m)  Wt 195 lb (88.451 kg)  BMI 36.86 kg/m2  SpO2 99%  LMP 01/17/2009  I have reviewed nursing notes and vital signs. I personally reviewed the imaging tests through PACS system  I reviewed  available ER/hospitalization records thought the EMR     Domenic Moras, PA-C 12/19/14 [place], MD 12/20/14 [phone]

## 2014-12-19 NOTE — ED Notes (Signed)
Pt verbalized understanding of discharge instructions. No further questions.

## 2014-12-22 ENCOUNTER — Ambulatory Visit (INDEPENDENT_AMBULATORY_CARE_PROVIDER_SITE_OTHER): Payer: Commercial Managed Care - HMO | Admitting: Obstetrics & Gynecology

## 2014-12-22 ENCOUNTER — Other Ambulatory Visit: Payer: Self-pay | Admitting: *Deleted

## 2014-12-22 ENCOUNTER — Encounter: Payer: Self-pay | Admitting: Obstetrics & Gynecology

## 2014-12-22 VITALS — BP 125/76 | HR 85 | Wt 198.0 lb

## 2014-12-22 DIAGNOSIS — N9089 Other specified noninflammatory disorders of vulva and perineum: Secondary | ICD-10-CM

## 2014-12-22 DIAGNOSIS — N904 Leukoplakia of vulva: Secondary | ICD-10-CM | POA: Diagnosis not present

## 2014-12-22 NOTE — Progress Notes (Signed)
   Subjective:    Patient ID: Mary Gray, female    DOB: 1961-02-20, 54 y.o.   MRN: [id number]  HPI  This sweet 54 yo lady with incontinence is here about her vulvar skin irritation. At her last visit she was prescribed vaginal estrogen to be used 3 times per week along with A&D ointment every day. She can't feel any improvement. She is waiting for her primary care MD to refer her back to Dr. Janifer Adieahlstead.  Review of Systems     Objective:   Physical Exam  I think there is a slight improvement of the excoriation/atrophy of her constantly wet (adult diapers) vulva.  I did a vulvar biopsy from her left labia majora where there was notable excoriation. I used 1% lidocaine and betadine to prep and she tolerated this well. Silver nitrate was used for hemostasis.      Assessment & Plan:  Vulvar irritation- continue with prescribed regimine. RTC several weeks for biopsy results and recheck

## 2014-12-22 NOTE — Telephone Encounter (Signed)
Last refill 11/24/14

## 2014-12-22 NOTE — Telephone Encounter (Signed)
[facility]

## 2014-12-24 MED ORDER — HYDROMORPHONE HCL 2 MG PO TABS: 2.0000 mg | ORAL_TABLET | Freq: Four times a day (QID) | ORAL | Status: DC | PRN

## 2014-12-24 NOTE — Telephone Encounter (Signed)
Pt called to get Rx 

## 2014-12-24 NOTE — Telephone Encounter (Signed)
Refill for hydromorphone printed and signed and provided to refill nurse.

## 2014-12-25 MED ORDER — TRAMADOL HCL 50 MG PO TABS: 50.0000 mg | ORAL_TABLET | Freq: Four times a day (QID) | ORAL | Status: DC | PRN

## 2014-12-25 NOTE — Telephone Encounter (Signed)
Refill approved for tramadol - nurse to call in.

## 2014-12-29 ENCOUNTER — Other Ambulatory Visit: Payer: Self-pay | Admitting: Internal Medicine

## 2014-12-29 NOTE — Telephone Encounter (Signed)
Refill approved - nurse to call in. 

## 2014-12-30 ENCOUNTER — Ambulatory Visit: Payer: Commercial Managed Care - HMO | Admitting: Internal Medicine

## 2015-01-01 ENCOUNTER — Other Ambulatory Visit: Payer: Self-pay | Admitting: Internal Medicine

## 2015-01-01 NOTE — Telephone Encounter (Signed)
Rx called in 

## 2015-01-01 NOTE — Telephone Encounter (Signed)
I approved a refill for this medication on 2/9; please confirm that the pharmacy received the refill.

## 2015-01-02 ENCOUNTER — Other Ambulatory Visit: Payer: Self-pay | Admitting: Internal Medicine

## 2015-01-07 ENCOUNTER — Telehealth: Payer: Self-pay | Admitting: Obstetrics & Gynecology

## 2015-01-07 NOTE — Telephone Encounter (Signed)
Patient called to cancel her appointment with Dr. Marice Potterove for tomorrow.  She did want to know if you could call her with her biopsy results.

## 2015-01-08 ENCOUNTER — Ambulatory Visit: Payer: Commercial Managed Care - HMO | Admitting: Obstetrics & Gynecology

## 2015-01-11 NOTE — Addendum Note (Signed)
Addended by: Neomia DearPOWERS, Carrol Bondar E on: 01/11/2015 05:43 PM   Modules accepted: Orders

## 2015-01-18 ENCOUNTER — Other Ambulatory Visit: Payer: Self-pay | Admitting: *Deleted

## 2015-01-18 NOTE — Telephone Encounter (Signed)
Last refill 2/4 Pt # [phone]

## 2015-01-20 MED ORDER — HYDROMORPHONE HCL 2 MG PO TABS: 2.0000 mg | ORAL_TABLET | Freq: Four times a day (QID) | ORAL | Status: DC | PRN

## 2015-01-20 NOTE — Telephone Encounter (Signed)
Pt informed Rx is ready 

## 2015-01-20 NOTE — Telephone Encounter (Signed)
Refill printed and signed and provided to refill nurse. 

## 2015-01-28 ENCOUNTER — Other Ambulatory Visit: Payer: Self-pay | Admitting: Internal Medicine

## 2015-01-28 NOTE — Telephone Encounter (Signed)
Refill approved - nurse to call in. 

## 2015-01-29 NOTE — Telephone Encounter (Signed)
Called to pharm 

## 2015-01-31 ENCOUNTER — Other Ambulatory Visit: Payer: Self-pay | Admitting: Internal Medicine

## 2015-02-01 NOTE — Telephone Encounter (Signed)
They do have the script, it was called in 3/11, pt has not picked up

## 2015-02-01 NOTE — Telephone Encounter (Signed)
We refilled this medication on 01/28/15.  Please call the pharmacy and find out why they are still requesting the refill.

## 2015-02-03 ENCOUNTER — Ambulatory Visit: Payer: Commercial Managed Care - HMO | Admitting: Internal Medicine

## 2015-02-03 DIAGNOSIS — R339 Retention of urine, unspecified: Secondary | ICD-10-CM | POA: Diagnosis not present

## 2015-02-03 DIAGNOSIS — N3941 Urge incontinence: Secondary | ICD-10-CM | POA: Diagnosis not present

## 2015-02-05 ENCOUNTER — Encounter: Payer: Self-pay | Admitting: *Deleted

## 2015-02-08 ENCOUNTER — Other Ambulatory Visit: Payer: Self-pay | Admitting: Internal Medicine

## 2015-02-08 ENCOUNTER — Other Ambulatory Visit: Payer: Self-pay | Admitting: Podiatrist

## 2015-02-09 ENCOUNTER — Ambulatory Visit (INDEPENDENT_AMBULATORY_CARE_PROVIDER_SITE_OTHER): Payer: Commercial Managed Care - HMO | Admitting: Internal Medicine

## 2015-02-09 ENCOUNTER — Encounter: Payer: Self-pay | Admitting: Internal Medicine

## 2015-02-09 VITALS — BP 125/74 | HR 82 | Temp 97.9°F | Wt 186.0 lb

## 2015-02-09 DIAGNOSIS — D509 Iron deficiency anemia, unspecified: Secondary | ICD-10-CM

## 2015-02-09 DIAGNOSIS — E039 Hypothyroidism, unspecified: Secondary | ICD-10-CM

## 2015-02-09 DIAGNOSIS — I10 Essential (primary) hypertension: Secondary | ICD-10-CM

## 2015-02-09 DIAGNOSIS — Z0000 Encounter for general adult medical examination without abnormal findings: Secondary | ICD-10-CM

## 2015-02-09 DIAGNOSIS — E119 Type 2 diabetes mellitus without complications: Secondary | ICD-10-CM

## 2015-02-09 DIAGNOSIS — M5440 Lumbago with sciatica, unspecified side: Secondary | ICD-10-CM | POA: Diagnosis not present

## 2015-02-09 DIAGNOSIS — Z114 Encounter for screening for human immunodeficiency virus [HIV]: Secondary | ICD-10-CM

## 2015-02-09 DIAGNOSIS — N3010 Interstitial cystitis (chronic) without hematuria: Secondary | ICD-10-CM

## 2015-02-09 DIAGNOSIS — Z1159 Encounter for screening for other viral diseases: Secondary | ICD-10-CM | POA: Diagnosis not present

## 2015-02-09 DIAGNOSIS — L719 Rosacea, unspecified: Secondary | ICD-10-CM

## 2015-02-09 DIAGNOSIS — E7800 Pure hypercholesterolemia, unspecified: Secondary | ICD-10-CM

## 2015-02-09 DIAGNOSIS — K921 Melena: Secondary | ICD-10-CM

## 2015-02-09 LAB — CBC WITH DIFFERENTIAL/PLATELET
Basophils Absolute: 0.1 10*3/uL (ref 0.0–0.1)
Basophils Relative: 1 % (ref 0–1)
Eosinophils Absolute: 0.5 10*3/uL (ref 0.0–0.7)
Eosinophils Relative: 5 % (ref 0–5)
HCT: 38.1 % (ref 36.0–46.0)
HEMOGLOBIN: 12.9 g/dL (ref 12.0–15.0)
LYMPHS ABS: 2.7 10*3/uL (ref 0.7–4.0)
LYMPHS PCT: 30 % (ref 12–46)
MCH: 29.9 pg (ref 26.0–34.0)
MCHC: 33.9 g/dL (ref 30.0–36.0)
MCV: 88.4 fL (ref 78.0–100.0)
MPV: 9.8 fL (ref 8.6–12.4)
Monocytes Absolute: 0.5 10*3/uL (ref 0.1–1.0)
Monocytes Relative: 6 % (ref 3–12)
NEUTROS ABS: 5.3 10*3/uL (ref 1.7–7.7)
NEUTROS PCT: 58 % (ref 43–77)
PLATELETS: 216 10*3/uL (ref 150–400)
RBC: 4.31 MIL/uL (ref 3.87–5.11)
RDW: 14.5 % (ref 11.5–15.5)
WBC: 9.1 10*3/uL (ref 4.0–10.5)

## 2015-02-09 LAB — COMPLETE METABOLIC PANEL WITH GFR
ALT: 13 U/L (ref 0–35)
AST: 16 U/L (ref 0–37)
Albumin: 4.5 g/dL (ref 3.5–5.2)
Alkaline Phosphatase: 67 U/L (ref 39–117)
BILIRUBIN TOTAL: 0.3 mg/dL (ref 0.2–1.2)
BUN: 17 mg/dL (ref 6–23)
CO2: 30 meq/L (ref 19–32)
Calcium: 9 mg/dL (ref 8.4–10.5)
Chloride: 98 mEq/L (ref 96–112)
Creat: 0.55 mg/dL (ref 0.50–1.10)
GFR, Est African American: >89 mL/min
GFR, Est Non African American: >89 mL/min
Glucose, Bld: 121 mg/dL — ABNORMAL HIGH (ref 70–99)
Potassium: 4.1 mEq/L (ref 3.5–5.3)
SODIUM: 137 meq/L (ref 135–145)
Total Protein: 6.6 g/dL (ref 6.0–8.3)

## 2015-02-09 LAB — GLUCOSE, CAPILLARY: GLUCOSE-CAPILLARY: 107 mg/dL — AB (ref 70–99)

## 2015-02-09 LAB — IRON AND TIBC
%SAT: 17 % — ABNORMAL LOW (ref 20–55)
Iron: 63 ug/dL (ref 42–145)
TIBC: 375 ug/dL (ref 250–470)
UIBC: 312 ug/dL (ref 125–400)

## 2015-02-09 LAB — POCT GLYCOSYLATED HEMOGLOBIN (HGB A1C): Hemoglobin A1C: 6.1

## 2015-02-09 MED ORDER — ENALAPRIL MALEATE 2.5 MG PO TABS: 2.5000 mg | ORAL_TABLET | Freq: Every day | ORAL | Status: DC

## 2015-02-09 MED ORDER — ROSUVASTATIN CALCIUM 10 MG PO TABS: 20.0000 mg | ORAL_TABLET | Freq: Every day | ORAL | Status: DC

## 2015-02-09 MED ORDER — CARISOPRODOL 350 MG PO TABS: 350.0000 mg | ORAL_TABLET | Freq: Two times a day (BID) | ORAL | Status: DC | PRN

## 2015-02-09 MED ORDER — GLUCOSE BLOOD VI STRP: ORAL_STRIP | Status: DC

## 2015-02-09 NOTE — Patient Instructions (Addendum)
Increase rosuvastatin (Crestor) 10 mg to a dose of 2 tablets once daily. Decrease carisoprodol (Soma) to a dose of 1 tablet twice a day as needed for muscle spasms. Please schedule a follow-up appointment with your neurosurgeon Dr. Lovell SheehanJenkins. A referral has been made to Dr. Paulita FujitaMagodfor evaluation of blood in stool. A referral has been made to your urologist for management of chronic cystitis. A referral has been made to your dermatologist for management of acne rosacea.

## 2015-02-09 NOTE — Progress Notes (Signed)
   Subjective:    Patient ID: Mary Gray, female    DOB: 26-Oct-1961, 54 y.o.   MRN: [id number]  HPI Patient returns for management of her type 2 diabetes mellitus, chronic low back pain with sciatica, hypertension, hypothyroidism, hypercholesterolemia, and other chronic medical problems.  She reports that she is compliant with her medications.  Her home blood glucose record shows an average of 129 over the past month, with a range from 95-198.  She reports that her chronic low back pain with sciatica is improved with the oral hydromorphone, which she is taking as prescribed; she feels that this improves her level of function.  She has been not using tramadol and reports that it as little to her pain control; she has been using her Tresa GarterSoma on an as-needed basis but does not always take it 3 times a day by her report.  She has not seen her neurosurgeon Dr. Lovell SheehanJenkins recently.  She reports some improvement in her vulvar discomfort following treatment by her gynecologist Dr. Marice Potterove.   Review of Systems  Respiratory: Negative for shortness of breath.   Cardiovascular: Positive for leg swelling (mild).  Gastrointestinal: Positive for vomiting and blood in stool (Red blood in small amounts). Negative for nausea and abdominal pain.    I reviewed and updated the medication list, allergies, past medical history, past surgical history, family history, and social history.     Objective:   Physical Exam  Constitutional: No distress.  Cardiovascular: Normal rate, regular rhythm and normal heart sounds.  Exam reveals no gallop and no friction rub.   No murmur heard. Trace bilateral lower extremity edema.  Pulmonary/Chest: Effort normal and breath sounds normal. No respiratory distress. She has no wheezes. She has no rales.  Abdominal: Soft. Bowel sounds are normal. She exhibits no distension. There is no tenderness. There is no rebound and no guarding.  Neurological: She has normal strength.          Assessment & Plan:

## 2015-02-10 LAB — FERRITIN: Ferritin: 70 ng/mL (ref 10–291)

## 2015-02-10 LAB — TSH: TSH: 5.352 u[IU]/mL — ABNORMAL HIGH (ref 0.350–4.500)

## 2015-02-10 LAB — HIV ANTIBODY (ROUTINE TESTING W REFLEX): HIV 1&2 Ab, 4th Generation: NONREACTIVE

## 2015-02-10 LAB — HEPATITIS C ANTIBODY: HCV Ab: NEGATIVE

## 2015-02-10 NOTE — Assessment & Plan Note (Signed)
Assessment: Patient has no symptoms of thyroid dysfunction on levothyroxine 75 mcg daily.    Plan: Check a TSH; continue Synthroid 75 mcg daily pending the result.

## 2015-02-10 NOTE — Assessment & Plan Note (Addendum)
Lab Results  Component Value Date   HGBA1C 6.1 02/09/2015   HGBA1C 6.2 09/29/2014   HGBA1C 6.1 05/20/2014     Assessment: Diabetes control: good control (HgbA1C at goal) Progress toward A1C goal:  at goal Comments: Hemoglobin A1c is at goal on metformin XR 500 mg twice a day.  Plan: Medications:  Continue current medication Home glucose monitoring: Frequency: once a day Timing: before breakfast Instruction/counseling given: reminded to get eye exam Educational resources provided: brochure Self management tools provided: copy of home glucose meter download Other plans: Refer to ophthalmologist for eye exam; I emphasized the importance of this to patient.  Check a comprehensive metabolic panel.

## 2015-02-10 NOTE — Assessment & Plan Note (Signed)
BP Readings from Last 3 Encounters:  02/09/15 125/74  12/22/14 125/76  12/19/14 125/66    Lab Results  Component Value Date   NA 137 02/09/2015   K 4.1 02/09/2015   CREATININE 0.55 02/09/2015    Assessment: Blood pressure control: controlled Progress toward BP goal:  at goal Comments: Blood pressure is well controlled on enalapril 2.5 mg daily; patient is also prescribed furosemide 20 mg daily, but has not been taking the furosemide regularly due to her incontinence.  Plan: Medications:  Continue current medication Educational resources provided: brochure Self management tools provided: home blood pressure logbook

## 2015-02-10 NOTE — Assessment & Plan Note (Signed)
Assessment: Patient reports red blood in her stool in small amounts.  She has been followed by Dr. Ewing SchleinMagod in the past.  Her last colonoscopy in May 2011 showed small external and internal hemorrhoids and a few hyperplastic polyps.  Plan: Check a CBC; refer to Dr. Ewing SchleinMagod for consideration of lower GI evaluation.

## 2015-02-10 NOTE — Assessment & Plan Note (Signed)
Assessment: This has been managed by dermatologist Dr. Terri PiedraLupton.  Patient was previously on erythromycin, but needs a follow-up appointment with Dr. Terri PiedraLupton.    Plan: Refer patient to Dr. Terri PiedraLupton for continuing care of her acne rosacea.

## 2015-02-10 NOTE — Assessment & Plan Note (Signed)
Lipids:    Component Value Date/Time   CHOL 184 12/03/2014 1218   TRIG 241* 12/03/2014 1218   HDL 36* 12/03/2014 1218   LDLCALC 100* 12/03/2014 1218   LDLDIRECT 101* 07/07/2009 2256   VLDL 48* 12/03/2014 1218   CHOLHDL 5.1 12/03/2014 1218    Assessment: Patient is doing well on rosuvastatin 10 mg daily, with no apparent side effects.  Her last LDL was just at the upper limit of target range.  I discussed the option of continuing the same dose of rosuvastatin, or increasing to a dose of 20 mg daily.  She would prefer to increase the dose.   Plan: Increase rosuvastatin to a dose of 20 mg daily.  She will need a lipid panel in 3-6 months.  I advised her to let me know she has any adverse effects on the higher dose.

## 2015-02-10 NOTE — Assessment & Plan Note (Addendum)
Assessment: Patient has a history of mild asymptomatic anemia.  She has been on ferrous sulfate in the past.  Plan: Check a CBC and iron studies.

## 2015-02-10 NOTE — Assessment & Plan Note (Signed)
Assessment: Patient has chronic low back pain with sciatica due to lumbar spinal stenosis and lumbar disc disease followed by neurosurgeon Dr. Lovell SheehanJenkins.  He has discussed the option of surgical intervention with her, but she has not yet made a decision to undergo surgery.  Her pain is reasonably well controlled on hydromorphone 2 mg every 6 hours as needed for severe pain.  She has also been on tramadol in the past, but reports little benefit from the tramadol so she has not been taking it frequently.  She feels that she is able to function better with the opioid pain medication.    Plan: Stop tramadol; continue hydromorphone 2 mg every 6 hours as needed for pain; follow up with neurosurgeon Dr. Lovell SheehanJenkins.

## 2015-02-10 NOTE — Assessment & Plan Note (Signed)
Assessment: Patient has chronic interstitial cystitis managed by her urologist Dr. Retta Dionesahlstedt.    Plan: I advised patient to follow up with Dr. Retta Dionesahlstedt.

## 2015-02-10 NOTE — Assessment & Plan Note (Signed)
Patient agreed to HIV and hepatitis C screening.  I reminded her of the importance of getting her eye exam, and made an ophthalmology referral to her ophthalmologist.

## 2015-02-11 ENCOUNTER — Other Ambulatory Visit: Payer: Self-pay | Admitting: *Deleted

## 2015-02-11 ENCOUNTER — Other Ambulatory Visit: Payer: Self-pay | Admitting: Internal Medicine

## 2015-02-11 MED ORDER — PREGABALIN 75 MG PO CAPS: 75.0000 mg | ORAL_CAPSULE | Freq: Every day | ORAL | Status: DC

## 2015-02-11 MED ORDER — GLUCOSE BLOOD VI STRP: ORAL_STRIP | Status: DC

## 2015-02-11 NOTE — Telephone Encounter (Addendum)
[facility] request refill of Lyrica 75mg  #60 i tablet bid for pt.  Dr. Irving ShowsEgerton ordered refill with 11 additional.  Orders called to [phone] at 1147am.

## 2015-02-15 ENCOUNTER — Other Ambulatory Visit: Payer: Self-pay | Admitting: Internal Medicine

## 2015-02-15 DIAGNOSIS — E119 Type 2 diabetes mellitus without complications: Secondary | ICD-10-CM | POA: Diagnosis not present

## 2015-02-16 ENCOUNTER — Other Ambulatory Visit: Payer: Self-pay | Admitting: Internal Medicine

## 2015-02-16 DIAGNOSIS — E039 Hypothyroidism, unspecified: Secondary | ICD-10-CM

## 2015-02-16 MED ORDER — LEVOTHYROXINE SODIUM 88 MCG PO TABS: 88.0000 ug | ORAL_TABLET | Freq: Every day | ORAL | Status: DC

## 2015-02-16 NOTE — Assessment & Plan Note (Signed)
Documentation Note  TSH is mildly elevated on levothyroxine 75 micrograms daily.  Plan is to increase levothyroxine to 88 micrograms daily, and have patient come in for repeat TSH and free T4 in 6 weeks.  Triage nurse informed patient of plan by phone. 

## 2015-02-16 NOTE — Progress Notes (Signed)
Documentation Note  TSH is mildly elevated on levothyroxine 75 micrograms daily.  Plan is to increase levothyroxine to 88 micrograms daily, and have patient come in for repeat TSH and free T4 in 6 weeks.  Triage nurse informed patient of plan by phone.

## 2015-02-16 NOTE — Progress Notes (Signed)
Quick Note:  TSH is mildly elevated on levothyroxine 75 g daily. Plan is to increase levothyroxine to 88 g daily, and have patient come in for repeat TSH and free T4 in 6 weeks. ______

## 2015-02-17 DIAGNOSIS — F331 Major depressive disorder, recurrent, moderate: Secondary | ICD-10-CM | POA: Diagnosis not present

## 2015-02-18 MED ORDER — HYDROMORPHONE HCL 2 MG PO TABS: 2.0000 mg | ORAL_TABLET | Freq: Four times a day (QID) | ORAL | Status: DC | PRN

## 2015-02-18 NOTE — Telephone Encounter (Signed)
Refill printed and signed - nurse to complete. 

## 2015-02-19 NOTE — Telephone Encounter (Signed)
Pt informed Rx is ready 

## 2015-02-20 ENCOUNTER — Other Ambulatory Visit: Payer: Self-pay | Admitting: Internal Medicine

## 2015-02-22 NOTE — Telephone Encounter (Signed)
I recently refilled the esomeprazole electronically on 02/11/2015 with additional refills; please confirm with pharmacy that they received a refill.  I recently changed the dose of rosuvastatin and sent new prescription on 02/09/2015 with refills; please confirm that pharmacy received the prescription.

## 2015-02-26 NOTE — Telephone Encounter (Signed)
Per Dr. Irving ShowsEgerton, reminder to refer any future Lyrica refill requests to Dr. Margarito LinerJerry Joines due to medication contract restrictions.

## 2015-03-04 DIAGNOSIS — H2513 Age-related nuclear cataract, bilateral: Secondary | ICD-10-CM | POA: Diagnosis not present

## 2015-03-04 DIAGNOSIS — E119 Type 2 diabetes mellitus without complications: Secondary | ICD-10-CM | POA: Diagnosis not present

## 2015-03-04 LAB — HM DIABETES EYE EXAM

## 2015-03-09 DIAGNOSIS — L718 Other rosacea: Secondary | ICD-10-CM | POA: Diagnosis not present

## 2015-03-09 DIAGNOSIS — L720 Epidermal cyst: Secondary | ICD-10-CM | POA: Diagnosis not present

## 2015-03-14 ENCOUNTER — Emergency Department (HOSPITAL_COMMUNITY)
Admission: EM | Admit: 2015-03-14 | Discharge: 2015-03-14 | Disposition: A | Discharge status: Home / Self Care | Payer: Commercial Managed Care - HMO | Attending: Emergency Medicine | Admitting: Emergency Medicine

## 2015-03-14 ENCOUNTER — Encounter (HOSPITAL_COMMUNITY): Payer: Self-pay | Admitting: Intensive Care

## 2015-03-14 DIAGNOSIS — R197 Diarrhea, unspecified: Secondary | ICD-10-CM | POA: Diagnosis not present

## 2015-03-14 DIAGNOSIS — K219 Gastro-esophageal reflux disease without esophagitis: Secondary | ICD-10-CM | POA: Diagnosis not present

## 2015-03-14 DIAGNOSIS — Z880 Allergy status to penicillin: Secondary | ICD-10-CM | POA: Insufficient documentation

## 2015-03-14 DIAGNOSIS — Z872 Personal history of diseases of the skin and subcutaneous tissue: Secondary | ICD-10-CM | POA: Diagnosis not present

## 2015-03-14 DIAGNOSIS — I10 Essential (primary) hypertension: Secondary | ICD-10-CM | POA: Insufficient documentation

## 2015-03-14 DIAGNOSIS — Z7951 Long term (current) use of inhaled steroids: Secondary | ICD-10-CM | POA: Insufficient documentation

## 2015-03-14 DIAGNOSIS — F419 Anxiety disorder, unspecified: Secondary | ICD-10-CM | POA: Diagnosis not present

## 2015-03-14 DIAGNOSIS — F329 Major depressive disorder, single episode, unspecified: Secondary | ICD-10-CM | POA: Insufficient documentation

## 2015-03-14 DIAGNOSIS — E039 Hypothyroidism, unspecified: Secondary | ICD-10-CM | POA: Insufficient documentation

## 2015-03-14 DIAGNOSIS — Z8739 Personal history of other diseases of the musculoskeletal system and connective tissue: Secondary | ICD-10-CM | POA: Insufficient documentation

## 2015-03-14 DIAGNOSIS — J0100 Acute maxillary sinusitis, unspecified: Secondary | ICD-10-CM | POA: Insufficient documentation

## 2015-03-14 DIAGNOSIS — J449 Chronic obstructive pulmonary disease, unspecified: Secondary | ICD-10-CM | POA: Insufficient documentation

## 2015-03-14 DIAGNOSIS — Z79899 Other long term (current) drug therapy: Secondary | ICD-10-CM | POA: Diagnosis not present

## 2015-03-14 DIAGNOSIS — Z87448 Personal history of other diseases of urinary system: Secondary | ICD-10-CM | POA: Insufficient documentation

## 2015-03-14 DIAGNOSIS — E669 Obesity, unspecified: Secondary | ICD-10-CM | POA: Insufficient documentation

## 2015-03-14 DIAGNOSIS — H9209 Otalgia, unspecified ear: Secondary | ICD-10-CM | POA: Diagnosis not present

## 2015-03-14 DIAGNOSIS — Z87891 Personal history of nicotine dependence: Secondary | ICD-10-CM | POA: Insufficient documentation

## 2015-03-14 DIAGNOSIS — Z8701 Personal history of pneumonia (recurrent): Secondary | ICD-10-CM | POA: Diagnosis not present

## 2015-03-14 DIAGNOSIS — Z792 Long term (current) use of antibiotics: Secondary | ICD-10-CM | POA: Insufficient documentation

## 2015-03-14 DIAGNOSIS — Z862 Personal history of diseases of the blood and blood-forming organs and certain disorders involving the immune mechanism: Secondary | ICD-10-CM | POA: Diagnosis not present

## 2015-03-14 DIAGNOSIS — R52 Pain, unspecified: Secondary | ICD-10-CM | POA: Diagnosis present

## 2015-03-14 MED ORDER — LEVOFLOXACIN 500 MG PO TABS: 500.0000 mg | ORAL_TABLET | Freq: Every day | ORAL | Status: DC

## 2015-03-14 NOTE — Discharge Instructions (Signed)
Sinusitis °Sinusitis is redness, soreness, and puffiness (inflammation) of the air pockets in the bones of your face (sinuses). The redness, soreness, and puffiness can cause air and mucus to get trapped in your sinuses. This can allow germs to grow and cause an infection.  °HOME CARE  °· Drink enough fluids to keep your pee (urine) clear or pale yellow. °· Use a humidifier in your home. °· Run a hot shower to create steam in the bathroom. Sit in the bathroom with the door closed. Breathe in the steam 3-4 times a day. °· Put a warm, moist washcloth on your face 3-4 times a day, or as told by your doctor. °· Use salt water sprays (saline sprays) to wet the thick fluid in your nose. This can help the sinuses drain. °· Only take medicine as told by your doctor. °GET HELP RIGHT AWAY IF:  °· Your pain gets worse. °· You have very bad headaches. °· You are sick to your stomach (nauseous). °· You throw up (vomit). °· You are very sleepy (drowsy) all the time. °· Your face is puffy (swollen). °· Your vision changes. °· You have a stiff neck. °· You have trouble breathing. °MAKE SURE YOU:  °· Understand these instructions. °· Will watch your condition. °· Will get help right away if you are not doing well or get worse. °Document Released: 04/24/2008 Document Revised: 07/31/2012 Document Reviewed: 06/11/2012 °Exit[employer]® Patient Information ©2015 [employer]. This information is not intended to replace advice given to you by your health care provider. Make sure you discuss any questions you have with your health care provider. ° °

## 2015-03-14 NOTE — ED Provider Notes (Signed)
CSN: [id number]     Arrival date & time 03/14/15  0915 History   First MD Initiated Contact with Patient 03/14/15 [phone]     Chief Complaint  Patient presents with  . Generalized Body Aches  . Cough      HPI Pt c/o ear ache, body ache, weakness, sore throat, dizziness, SOB and cough X6 days. Pt has a productive cough and states color is green/ brownish. Pt is diabetic. FSBS was 160 at 0845 checked by pt at home.  Past Medical History  Diagnosis Date  . Diabetes mellitus, type 2   . Hypertension   . Unspecified asthma(493.90)   . Anxiety state, unspecified   . Hypercholesteremia   . Hypothyroidism   . Obesity   . GERD (gastroesophageal reflux disease)   . Iron deficiency anemia   . DDD (degenerative disc disease), lumbar   . Spinal stenosis   . Low back pain   . Degeneration of cervical intervertebral disc   . Left knee pain     Following fall 11/15/2009.  Marland Kitchen Acne rosacea   . Heme positive stool     EGD on 04/08/2010 by Dr. Watt Climes showed chronic gastritis and a few gastric polyps; pathology showed a fundic gland polyp.  Colonoscopy on 04/08/2010 showed small external and internal hemorrhoids, and a few benign-appearing diminutive polyps in the rectum, the distal sigmoid colon, and in the distal descending colon; pathology showed hyperplastic polyps.   . Hematochezia   . Irregular menses   . Vagina bleeding   . Atrophic vaginitis   . Viral warts, unspecified     Right hand.  . Chronic interstitial cystitis     Followed by Dr. Franchot Gallo  . Elevated transaminase level     Mild elevation, AST=48, ALT=51 on 02/01/07.  . Allergic rhinitis   . Carpal tunnel syndrome   . Atypical chest pain 12/11/2008    Normal stress nuclear study 01/12/2009 by Dr. Peter Martinique  . Bilateral leg edema   . COPD (chronic obstructive pulmonary disease)   . Dysrhythmia   . Depression   . Shortness of breath   . Pneumonia   . TWSFKCLE(751.7)    Past Surgical History  Procedure Laterality  Date  . Carpal tunnel release  05/08/2000    By Dr. Youlanda Mighty. Sypher, Brooke Bonito.   Family History  Problem Relation Age of Onset  . Heart attack Father 63  . Breast cancer Neg Hx   . Colon cancer Neg Hx   . Diabetes Mother   . Hypertension Mother   . Diabetes Sister    History  Substance Use Topics  . Smoking status: Former Smoker -- 1.00 packs/day for 15 years    Types: Cigarettes    Quit date: 01/18/1996  . Smokeless tobacco: Never Used  . Alcohol Use: No   OB History    No data available     Review of Systems  Constitutional: Positive for fever, chills and fatigue.  HENT: Positive for congestion, ear pain, postnasal drip, rhinorrhea and sinus pressure.   Respiratory: Positive for cough.   Cardiovascular: Positive for chest pain.  Gastrointestinal: Positive for diarrhea.  All other systems reviewed and are negative.     Allergies  Codeine sulfate; Meperidine and related; Minocycline hcl; Penicillins; Propoxyphene n-acetaminophen; and Tetracycline hcl  Home Medications   Prior to Admission medications   Medication Sig Start Date End Date Taking? Authorizing Provider  albuterol (PROAIR HFA) 108 (90 BASE) MCG/ACT inhaler Inhale 2 puffs into  the lungs every 6 (six) hours as needed for wheezing or shortness of breath. 05/25/14  Yes Bertha Stakes, MD  ALPRAZolam Duanne Moron) 1 MG tablet Take 1 mg by mouth 4 (four) times daily as needed for anxiety.   Yes Historical Provider, MD  amitriptyline (ELAVIL) 25 MG tablet Take 1 tablet (25 mg total) by mouth at bedtime. 08/27/14  Yes Bertha Stakes, MD  Blood Glucose Monitoring Suppl (ACCU-CHEK COMPACT CARE KIT) KIT Use to test blood glucose one time daily. Dx:250.00 03/18/14  Yes Bertha Stakes, MD  budesonide-formoterol [facility]) 160-4.5 MCG/ACT inhaler Inhale 2 puffs into the lungs 2 (two) times daily.   Yes Historical Provider, MD  carisoprodol (SOMA) 350 MG tablet Take 1 tablet (350 mg total) by mouth 2 (two) times daily as needed for muscle  spasms. Patient taking differently: Take 350 mg by mouth 3 (three) times daily.  02/09/15  Yes Bertha Stakes, MD  enalapril (VASOTEC) 2.5 MG tablet Take 1 tablet (2.5 mg total) by mouth daily. 02/09/15   Bertha Stakes, MD  erythromycin ethylsuccinate (EES) 400 MG tablet Take 400 mg by mouth 2 (two) times daily. Maintenance for Rosacea    Historical Provider, MD  esomeprazole (NEXIUM) 40 MG capsule Take 1 capsule (40 mg total) by mouth daily. 02/11/15   Bertha Stakes, MD  estradiol (ESTRACE) 0.1 MG/GM vaginal cream Apply 1 gram per vagina every night for 2 weeks, then apply three times a week 11/24/14   Donnamae Jude, MD  FLUoxetine (PROZAC) 20 MG capsule Take 20 mg by mouth daily.  11/15/13   Historical Provider, MD  fluticasone (FLONASE) 50 MCG/ACT nasal spray Place 2 sprays into both nostrils daily.    Historical Provider, MD  furosemide (LASIX) 20 MG tablet TAKE 1 TABLET BY MOUTH EVERY OTHER DAY Patient not taking: Reported on 02/09/2015 11/23/14   Bartholomew Crews, MD  glucose blood (ACCU-CHEK AVIVA PLUS) test strip Use as instructed to check blood sugar once daily. Dx: ICD-10 code E11.9 02/11/15   Bertha Stakes, MD  HYDROmorphone (DILAUDID) 2 MG tablet Take 1 tablet (2 mg total) by mouth every 6 (six) hours as needed for severe pain. 02/18/15   Bertha Stakes, MD  ipratropium (ATROVENT HFA) 17 MCG/ACT inhaler Inhale 2 puffs into the lungs 4 (four) times daily as needed for wheezing. 10/09/14   Bertha Stakes, MD  KRISTALOSE 20 G packet Take 20 g by mouth 2 (two) times daily as needed (constipation).  05/13/12   Historical Provider, MD  levofloxacin (LEVAQUIN) 500 MG tablet Take 1 tablet (500 mg total) by mouth daily. 03/14/15   Leonard Schwartz, MD  levothyroxine (SYNTHROID) 88 MCG tablet Take 1 tablet (88 mcg total) by mouth daily before breakfast. 02/16/15   Bertha Stakes, MD  metFORMIN (GLUCOPHAGE-XR) 500 MG 24 hr tablet Take 1 tablet (500 mg total) by mouth 2 (two) times daily. 09/28/14   Bertha Stakes, MD    nystatin (MYCOSTATIN) powder Apply topically two times a day to affected areas of skin for 10 days. 06/25/13   Bertha Stakes, MD  potassium chloride (KLOR-CON M10) 10 MEQ tablet Take 2 tablets (20 mEq total) by mouth daily. 02/11/15   Bertha Stakes, MD  pregabalin (LYRICA) 75 MG capsule Take 1 capsule (75 mg total) by mouth daily. 02/11/15   Bronson Ing, DPM  rOPINIRole (REQUIP) 4 MG tablet TAKE 1 TABLET BY MOUTH EVERY DAY AT BEDTIME 11/19/14   Bronson Ing, DPM  rosuvastatin (CRESTOR) 10 MG tablet Take 2 tablets (20  mg total) by mouth daily. 02/09/15   Bertha Stakes, MD  zolpidem (AMBIEN) 10 MG tablet Take 5 mg by mouth at bedtime as needed for sleep.     Historical Provider, MD   BP 148/98 mmHg  Pulse 65  Temp(Src) 98.2 F (36.8 C) (Oral)  Resp 16  SpO2 97%  LMP 01/17/2009 Physical Exam  Constitutional: She is oriented to person, place, and time. She appears well-developed and well-nourished. No distress.  HENT:  Head: Normocephalic and atraumatic.    Eyes: Pupils are equal, round, and reactive to light.  Neck: Normal range of motion.  Cardiovascular: Normal rate and intact distal pulses.   Pulmonary/Chest: No respiratory distress.  Abdominal: Normal appearance. She exhibits no distension.  Musculoskeletal: Normal range of motion.  Neurological: She is alert and oriented to person, place, and time. No cranial nerve deficit.  Skin: Skin is warm and dry. No rash noted.  Psychiatric: She has a normal mood and affect. Her behavior is normal.  Nursing note and vitals reviewed.   ED Course  Procedures (including critical care time) Labs Review Labs Reviewed - No data to display  Imaging Review No results found.    MDM   Final diagnoses:  Acute maxillary sinusitis, recurrence not specified        Leonard Schwartz, MD 03/14/15 1052

## 2015-03-14 NOTE — ED Notes (Signed)
Pt c/o ear ache, body ache, weakness, sore throat, dizziness, SOB and cough X6 days. Pt has a productive cough and states color is green/ brownish. Pt is diabetic. FSBS was 160 at 0845 checked by pt at home.

## 2015-03-14 NOTE — ED Notes (Signed)
Dr. Beaton at the bedside. 

## 2015-03-17 ENCOUNTER — Telehealth: Payer: Self-pay | Admitting: *Deleted

## 2015-03-17 DIAGNOSIS — R339 Retention of urine, unspecified: Secondary | ICD-10-CM | POA: Diagnosis not present

## 2015-03-17 DIAGNOSIS — R3915 Urgency of urination: Secondary | ICD-10-CM | POA: Diagnosis not present

## 2015-03-17 DIAGNOSIS — N3941 Urge incontinence: Secondary | ICD-10-CM | POA: Diagnosis not present

## 2015-03-17 NOTE — Telephone Encounter (Signed)
Pt called - would like Rx for Dilaudid 4mg  instead of 2mg . States when she takes 2mg  - she has to take the med more freq. States back feels much better with 4mg . Stanton KidneyDebra Darene Nappi RN 03/17/15 4PM

## 2015-03-18 NOTE — Telephone Encounter (Signed)
Talked with pt - aware she needs an appt to be assessed. Sch appt 03/24/15 10:15AM Dr Isabella BowensKrall. Pt aware.

## 2015-03-18 NOTE — Telephone Encounter (Signed)
Patient will need to be seen in clinic and assessed before any change in her pain medication could be considered.  I am not willing to increase the dose without a clear indication that such a change is warranted.

## 2015-03-20 ENCOUNTER — Other Ambulatory Visit: Payer: Self-pay | Admitting: Internal Medicine

## 2015-03-22 ENCOUNTER — Other Ambulatory Visit: Payer: Commercial Managed Care - HMO

## 2015-03-22 ENCOUNTER — Encounter: Payer: Self-pay | Admitting: *Deleted

## 2015-03-22 ENCOUNTER — Other Ambulatory Visit: Payer: Self-pay | Admitting: *Deleted

## 2015-03-22 DIAGNOSIS — N905 Atrophy of vulva: Secondary | ICD-10-CM | POA: Diagnosis not present

## 2015-03-22 DIAGNOSIS — Z79891 Long term (current) use of opiate analgesic: Secondary | ICD-10-CM

## 2015-03-22 DIAGNOSIS — Z79899 Other long term (current) drug therapy: Secondary | ICD-10-CM | POA: Diagnosis not present

## 2015-03-22 DIAGNOSIS — R350 Frequency of micturition: Secondary | ICD-10-CM | POA: Diagnosis not present

## 2015-03-22 DIAGNOSIS — R339 Retention of urine, unspecified: Secondary | ICD-10-CM | POA: Diagnosis not present

## 2015-03-22 MED ORDER — HYDROMORPHONE HCL 2 MG PO TABS: 2.0000 mg | ORAL_TABLET | Freq: Four times a day (QID) | ORAL | Status: DC | PRN

## 2015-03-22 NOTE — Telephone Encounter (Signed)
Last refill on 4/5 Pt # [phone]

## 2015-03-22 NOTE — Telephone Encounter (Signed)
Pt informed Rx is ready but can't fill until 5/5

## 2015-03-23 LAB — PRESCRIPTION ABUSE MONITORING 15P, URINE
Amphetamine/Meth: NEGATIVE ng/mL
Barbiturate Screen, Urine: NEGATIVE ng/mL
Buprenorphine, Urine: NEGATIVE ng/mL
Cannabinoid Scrn, Ur: NEGATIVE ng/mL
Cocaine Metabolites: NEGATIVE ng/mL
Creatinine, Urine: 27.33 mg/dL (ref >20.0)
Fentanyl, Ur: NEGATIVE ng/mL
Meperidine, Ur: NEGATIVE ng/mL
Methadone Screen, Urine: NEGATIVE ng/mL
Oxycodone Screen, Ur: NEGATIVE ng/mL
PROPOXYPHENE: NEGATIVE ng/mL
TRAMADOL UR: NEGATIVE ng/mL

## 2015-03-23 NOTE — Addendum Note (Signed)
Addended by: Neomia DearPOWERS, Pieper Kasik E on: 03/23/2015 05:59 PM   Modules accepted: Orders

## 2015-03-24 ENCOUNTER — Ambulatory Visit: Payer: Commercial Managed Care - HMO | Admitting: Pulmonary Disease

## 2015-03-25 LAB — BENZODIAZEPINES (GC/LC/MS), URINE
Alprazolam metabolite (GC/LC/MS), ur confirm: 203 ng/mL — ABNORMAL HIGH (ref <25)
CLONAZEPAU: NEGATIVE ng/mL (ref <25)
Flurazepam metabolite (GC/LC/MS), ur confirm: NEGATIVE ng/mL (ref <50)
LORAZEPAMU: NEGATIVE ng/mL (ref <50)
MIDAZOLAMU: NEGATIVE ng/mL (ref <50)
NORDIAZEPAMU: NEGATIVE ng/mL (ref <50)
Oxazepam (GC/LC/MS), ur confirm: NEGATIVE ng/mL (ref <50)
TRIAZOLAMU: NEGATIVE ng/mL (ref <50)
Temazepam (GC/LC/MS), ur confirm: NEGATIVE ng/mL (ref <50)

## 2015-03-25 LAB — OPIATES/OPIOIDS (LC/MS-MS)
CODEINE URINE: NEGATIVE ng/mL (ref <50)
HYDROCODONE: NEGATIVE ng/mL (ref <50)
Hydromorphone: 399 ng/mL — ABNORMAL HIGH (ref <50)
MORPHINE: NEGATIVE ng/mL (ref <50)
NORHYDROCODONE, UR: NEGATIVE ng/mL (ref <50)
NOROXYCODONE, UR: NEGATIVE ng/mL (ref <50)
Oxycodone, ur: NEGATIVE ng/mL (ref <50)
Oxymorphone: NEGATIVE ng/mL (ref <50)

## 2015-03-25 LAB — ZOLPIDEM (LC/MS-MS), URINE
ZOLPIDEM METABOLITE (GC/LS/MS) UR, CONFIRM: 383 ng/mL — AB (ref <5)
Zolpidem (GC/LC/MS), Ur confirm: 15 ng/mL — ABNORMAL HIGH (ref <5)

## 2015-03-25 LAB — CARISOPRODOL (GC/LC/MS), URINE: Meprobamate (GC/LC/MS), ur confirm: 1996 ng/mL — ABNORMAL HIGH (ref <1000)

## 2015-04-05 ENCOUNTER — Encounter: Payer: Self-pay | Admitting: Obstetrics & Gynecology

## 2015-04-05 ENCOUNTER — Ambulatory Visit (INDEPENDENT_AMBULATORY_CARE_PROVIDER_SITE_OTHER): Payer: Commercial Managed Care - HMO | Admitting: Obstetrics & Gynecology

## 2015-04-05 VITALS — BP 142/81 | HR 108 | Ht 61.0 in | Wt 203.0 lb

## 2015-04-05 DIAGNOSIS — R300 Dysuria: Secondary | ICD-10-CM

## 2015-04-05 DIAGNOSIS — L900 Lichen sclerosus et atrophicus: Secondary | ICD-10-CM | POA: Diagnosis not present

## 2015-04-05 NOTE — Progress Notes (Signed)
   Subjective:    Patient ID: Mary Gray, female    DOB: [birth year], 54 y.o.   MRN: [id number]  HPI  She is here for her results of her vulva biopsy. It showed lichen sclerosus.  Review of Systems     Objective:   Physical Exam [id number] Breathing normally She has lost some weight since her last visit.  Her vulva actually looks the least irritated that I've seen it. There are white areas on either side of the introitus that are lichen sclerosus (according to the biopsy results).     Assessment & Plan:  Lichen sclerosus- add clobetasol QOHS to her vaginal estrogen and A&D ointment. RTC prn Check uc&s per her request

## 2015-04-08 LAB — URINE CULTURE: Colony Count: 25000

## 2015-04-22 ENCOUNTER — Other Ambulatory Visit: Payer: Self-pay | Admitting: *Deleted

## 2015-04-22 NOTE — Telephone Encounter (Signed)
Last refill 5/5 Pt # [id number]

## 2015-04-23 MED ORDER — HYDROMORPHONE HCL 2 MG PO TABS: 2.0000 mg | ORAL_TABLET | Freq: Four times a day (QID) | ORAL | Status: DC | PRN

## 2015-04-23 NOTE — Telephone Encounter (Signed)
Last saw PCP 3/16. UDS 5/16 OK. Benzo per mental health. Will fill 2 months and ask appt be sch PCP during that time. [place] controlled sub database [place].

## 2015-04-30 DIAGNOSIS — N952 Postmenopausal atrophic vaginitis: Secondary | ICD-10-CM | POA: Diagnosis not present

## 2015-04-30 DIAGNOSIS — R339 Retention of urine, unspecified: Secondary | ICD-10-CM | POA: Diagnosis not present

## 2015-05-03 ENCOUNTER — Telehealth: Payer: Self-pay | Admitting: *Deleted

## 2015-05-03 NOTE — Telephone Encounter (Signed)
Pharm sent a note stating pt states she only takes 1 pill daily, called pt and she states dr Meredith Pel told her she would only need to take 1 pill, explained that her script is for 10mg  2x daily, she states she only wants 1 pill daily, could we change her script to 20mg  1 tab daily, the 20mg  will be cheaper also

## 2015-05-03 NOTE — Telephone Encounter (Signed)
Changed per dr Midwife

## 2015-05-03 NOTE — Telephone Encounter (Signed)
Agree. Thanks

## 2015-05-03 NOTE — Telephone Encounter (Signed)
What medication

## 2015-05-14 ENCOUNTER — Other Ambulatory Visit: Payer: Self-pay | Admitting: *Deleted

## 2015-05-14 ENCOUNTER — Other Ambulatory Visit: Payer: Self-pay | Admitting: Internal Medicine

## 2015-05-14 NOTE — Telephone Encounter (Signed)
Opened for review. 

## 2015-05-17 DIAGNOSIS — E119 Type 2 diabetes mellitus without complications: Secondary | ICD-10-CM | POA: Diagnosis not present

## 2015-05-19 ENCOUNTER — Encounter: Payer: Self-pay | Admitting: *Deleted

## 2015-05-20 DIAGNOSIS — F331 Major depressive disorder, recurrent, moderate: Secondary | ICD-10-CM | POA: Diagnosis not present

## 2015-05-31 ENCOUNTER — Encounter: Payer: Self-pay | Admitting: *Deleted

## 2015-06-09 ENCOUNTER — Other Ambulatory Visit: Payer: Self-pay | Admitting: Internal Medicine

## 2015-06-09 NOTE — Telephone Encounter (Signed)
Dr Meredith PelJoines saw this patient in March 2016, TSH was mildly elevated and he increased synthroid from 75mcg to 88 mcg. The patient was supposed to get TSH levels done in 6-8 weeks which have not been done. I would want to schedule the patient for an visit/or labs whichever is earlier and would refrain to fill the prescription until I have next TSH value. Aletta EdouardShilpa Dewitte Vannice MD MPH 06/09/2015 4:29 PM

## 2015-06-10 ENCOUNTER — Other Ambulatory Visit: Payer: Self-pay | Admitting: Internal Medicine

## 2015-06-10 NOTE — Telephone Encounter (Signed)
Tried to call this am, no answer, lm for rtc

## 2015-06-10 NOTE — Telephone Encounter (Signed)
appt scheduled for 7/29 dr Andrey Campanile per pt's choice See request from 7/20

## 2015-06-11 NOTE — Telephone Encounter (Signed)
Has this patient completely run out of Synthroid?  It looks like she was prescribed enough to last through this month.  She should continue taking her current dose until I see her next week.  If she is completely out I will send in rx for #30.

## 2015-06-12 ENCOUNTER — Other Ambulatory Visit: Payer: Self-pay | Admitting: Internal Medicine

## 2015-06-14 MED ORDER — LEVOTHYROXINE SODIUM 88 MCG PO TABS: 88.0000 ug | ORAL_TABLET | Freq: Every day | ORAL | Status: DC

## 2015-06-14 NOTE — Addendum Note (Signed)
Addended by: Merrie Roof A on: 06/14/2015 03:01 PM   Modules accepted: Orders

## 2015-06-14 NOTE — Telephone Encounter (Signed)
Patient States she just talked to the pharmacy and they stated they do not have the refills at the pharmacy yet. Pt states she is completely out of synthroid as well.

## 2015-06-14 NOTE — Telephone Encounter (Signed)
Just filled 7/22.  Refused

## 2015-06-14 NOTE — Telephone Encounter (Signed)
Med refilled by Dr Andrey Campanile

## 2015-06-18 ENCOUNTER — Ambulatory Visit: Payer: Commercial Managed Care - HMO | Admitting: Internal Medicine

## 2015-06-22 ENCOUNTER — Other Ambulatory Visit: Payer: Self-pay | Admitting: Internal Medicine

## 2015-06-22 MED ORDER — HYDROMORPHONE HCL 2 MG PO TABS: 2.0000 mg | ORAL_TABLET | Freq: Four times a day (QID) | ORAL | Status: DC | PRN

## 2015-06-22 NOTE — Telephone Encounter (Signed)
Pt informed Rx is ready 

## 2015-06-22 NOTE — Telephone Encounter (Signed)
Last refill per pharmacy 7/4 Clinic appointment scheduled for 8/15

## 2015-06-22 NOTE — Telephone Encounter (Signed)
Pt requesting pain meds to be filled. °

## 2015-07-05 ENCOUNTER — Encounter: Payer: Commercial Managed Care - HMO | Admitting: Student in an Organized Health Care Education/Training Program

## 2015-07-10 ENCOUNTER — Other Ambulatory Visit: Payer: Self-pay | Admitting: Internal Medicine

## 2015-07-24 ENCOUNTER — Other Ambulatory Visit: Payer: Self-pay | Admitting: Internal Medicine

## 2015-07-27 NOTE — Telephone Encounter (Signed)
I approved this for one month supply. Please call in the refill. Please also call the patient to arrange

## 2015-07-27 NOTE — Telephone Encounter (Signed)
Called to pharm 

## 2015-07-27 NOTE — Telephone Encounter (Signed)
I approved this for a five week supply. Please phone this refill in. I would like to see the patient in clinic before giving any further controlled refills. Please call her and ask her to see me Monday 10/10 for my next available.

## 2015-07-28 ENCOUNTER — Other Ambulatory Visit: Payer: Self-pay | Admitting: Internal Medicine

## 2015-07-28 ENCOUNTER — Other Ambulatory Visit: Payer: Self-pay | Admitting: Student in an Organized Health Care Education/Training Program

## 2015-07-28 NOTE — Telephone Encounter (Signed)
Last filled at pharmacy 06/25/2015 Last appt 02/09/2015 Next appt 07/30/2015 dr v patel Last UDS 03/22/2015

## 2015-07-28 NOTE — Telephone Encounter (Signed)
Pt called requesting dilaudid to be filled.

## 2015-07-29 MED ORDER — HYDROMORPHONE HCL 2 MG PO TABS: 2.0000 mg | ORAL_TABLET | Freq: Four times a day (QID) | ORAL | Status: DC | PRN

## 2015-07-30 ENCOUNTER — Ambulatory Visit: Payer: Commercial Managed Care - HMO | Admitting: Internal Medicine

## 2015-07-30 NOTE — Telephone Encounter (Signed)
No answer when called pt, lm for rtc

## 2015-08-04 ENCOUNTER — Ambulatory Visit: Payer: Commercial Managed Care - HMO | Admitting: Internal Medicine

## 2015-08-10 ENCOUNTER — Encounter: Payer: Self-pay | Admitting: Student in an Organized Health Care Education/Training Program

## 2015-08-10 ENCOUNTER — Ambulatory Visit: Payer: Commercial Managed Care - HMO | Admitting: Internal Medicine

## 2015-08-12 ENCOUNTER — Encounter: Payer: Self-pay | Admitting: Internal Medicine

## 2015-08-12 ENCOUNTER — Ambulatory Visit (INDEPENDENT_AMBULATORY_CARE_PROVIDER_SITE_OTHER): Payer: Commercial Managed Care - HMO | Admitting: Internal Medicine

## 2015-08-12 VITALS — BP 120/71 | HR 79 | Temp 98.3°F | Ht 61.0 in | Wt 212.4 lb

## 2015-08-12 DIAGNOSIS — J370 Chronic laryngitis: Secondary | ICD-10-CM | POA: Diagnosis not present

## 2015-08-12 DIAGNOSIS — E039 Hypothyroidism, unspecified: Secondary | ICD-10-CM

## 2015-08-12 DIAGNOSIS — M5442 Lumbago with sciatica, left side: Secondary | ICD-10-CM | POA: Diagnosis not present

## 2015-08-12 DIAGNOSIS — E119 Type 2 diabetes mellitus without complications: Secondary | ICD-10-CM | POA: Diagnosis not present

## 2015-08-12 DIAGNOSIS — R5382 Chronic fatigue, unspecified: Secondary | ICD-10-CM | POA: Diagnosis not present

## 2015-08-12 DIAGNOSIS — R5383 Other fatigue: Secondary | ICD-10-CM

## 2015-08-12 DIAGNOSIS — I10 Essential (primary) hypertension: Secondary | ICD-10-CM | POA: Diagnosis not present

## 2015-08-12 DIAGNOSIS — M5440 Lumbago with sciatica, unspecified side: Secondary | ICD-10-CM

## 2015-08-12 DIAGNOSIS — D509 Iron deficiency anemia, unspecified: Secondary | ICD-10-CM

## 2015-08-12 DIAGNOSIS — Z23 Encounter for immunization: Secondary | ICD-10-CM | POA: Diagnosis not present

## 2015-08-12 DIAGNOSIS — G9332 Myalgic encephalomyelitis/chronic fatigue syndrome: Secondary | ICD-10-CM

## 2015-08-12 LAB — POCT GLYCOSYLATED HEMOGLOBIN (HGB A1C): Hemoglobin A1C: 6.7

## 2015-08-12 LAB — GLUCOSE, CAPILLARY: GLUCOSE-CAPILLARY: 150 mg/dL — AB (ref 65–99)

## 2015-08-12 MED ORDER — HYDROMORPHONE HCL 2 MG PO TABS: 3.0000 mg | ORAL_TABLET | Freq: Three times a day (TID) | ORAL | Status: DC | PRN

## 2015-08-12 MED ORDER — ESOMEPRAZOLE MAGNESIUM 40 MG PO CPDR: 40.0000 mg | DELAYED_RELEASE_CAPSULE | Freq: Every day | ORAL | Status: DC

## 2015-08-12 MED ORDER — NYSTATIN 100000 UNIT/GM EX POWD: CUTANEOUS | Status: DC

## 2015-08-12 NOTE — Assessment & Plan Note (Signed)
Pt with 6 months of hoarseness of her voice.  No SOB, cough, dysphagia, or weight loss . She has GERD but her GERD symptoms have only been present for the last 2-3 weeks, so it does not explain the cause of her laryngitis. She does not smoke and is not exposed to second hand smoke  Plan Referral to ENT for laryngoscopy

## 2015-08-12 NOTE — Assessment & Plan Note (Signed)
Lab Results  Component Value Date   HGBA1C 6.7 08/12/2015   HGBA1C 6.1 02/09/2015   HGBA1C 6.2 09/29/2014    Well controlled on metformin 500 mg BID  Plan- continue metformin 500 mg bid Urged her to bring her log at next visit for review

## 2015-08-12 NOTE — Assessment & Plan Note (Addendum)
Pt complains of feeling tired all the time, gained 15 pounds, poor sleep (both initiating and staying asleep)- bed time hours are 10 PM to 5 AM, but she wakes up within an hour of going to sleep. She takes zolpidem. 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 She lives by herself/ Her meds include amitryptiline, prozac, alprazolam, dilaudid, "soma" (muscle relaxant), zolpidem, and tramadol PRN, and also requip  Most likely hypothyroidism is contributing to her fatigue and also concurrent depression. This has been worked up int he past- HIV neg, Hep C negative.  Plan- Repeat TSH BMP Hemoglobin to check for anemia, B12 level, ferritin Encouraged to go back to her psychiatrist  Advised healthy lifestyle- including dietary modifications  I have clearly told her that she is taking a lot of medications and there are a lot of drug interactions going on- amitryptiline, prozac, alprazolam, dilaudid, "soma" (muscle relaxant), zolpidem, and tramadol PRN, and that she needs to think about weaning off of these medicines one at a time, if she wants to have a better quality of life.

## 2015-08-12 NOTE — Assessment & Plan Note (Signed)
BP Readings from Last 3 Encounters:  08/12/15 120/71  04/05/15 142/81  03/14/15 148/98   BP well controlled on enalapril 2.5 mg daily Continue with the enalapril Discontinued furosemide as she was not taking it and her BP is great without it

## 2015-08-12 NOTE — Assessment & Plan Note (Signed)
Pt has chronic low back pain due to LSS and had MRI done in 2015 which showed LSS and impingement of L3.  She was seeing Dr. Lovell Sheehan- neurosurgeon and they recommended her surgery but she did not want to pursue at that time. She also received streoid shots but without help   Plan Emphasized her to go back to Dr Lovell Sheehan and that surgery may have a chance to improve her symptoms Refilled dilaudid for 2 more month until she sees Dr Oswaldo Done

## 2015-08-12 NOTE — Progress Notes (Signed)
Patient ID: Mary Gray, female   DOB: 01-21-1961, 54 y.o.   MRN: [id number]    Subjective:   Patient ID: Mary Gray female   DOB: 03-24-1961 54 y.o.   MRN: [id number]  HPI: [name] is a 54 y.o. woman with notable PMH of  Chronic low back pain with Sciatica, HTN, DM, HLD, hypothyroidism, GERD, IDA. She is here to discuss low back pain, fatigue, hoarseness, Diabetes, and pain medicine management    Past Medical History  Diagnosis Date  . Diabetes mellitus, type 2   . Hypertension   . Unspecified asthma(493.90)   . Anxiety state, unspecified   . Hypercholesteremia   . Hypothyroidism   . Obesity   . GERD (gastroesophageal reflux disease)   . Iron deficiency anemia   . DDD (degenerative disc disease), lumbar   . Spinal stenosis   . Low back pain   . Degeneration of cervical intervertebral disc   . Left knee pain     Following fall 11/15/2009.  Marland Kitchen Acne rosacea   . Heme positive stool     EGD on 04/08/2010 by Dr. Watt Climes showed chronic gastritis and a few gastric polyps; pathology showed a fundic gland polyp.  Colonoscopy on 04/08/2010 showed small external and internal hemorrhoids, and a few benign-appearing diminutive polyps in the rectum, the distal sigmoid colon, and in the distal descending colon; pathology showed hyperplastic polyps.   . Hematochezia   . Irregular menses   . Vagina bleeding   . Atrophic vaginitis   . Viral warts, unspecified     Right hand.  . Chronic interstitial cystitis     Followed by Dr. Franchot Gallo  . Elevated transaminase level     Mild elevation, AST=48, ALT=51 on 02/01/07.  . Allergic rhinitis   . Carpal tunnel syndrome   . Atypical chest pain 12/11/2008    Normal stress nuclear study 01/12/2009 by Dr. Peter Martinique  . Bilateral leg edema   . COPD (chronic obstructive pulmonary disease)   . Dysrhythmia   . Depression   . Shortness of breath   . Pneumonia   . XKGYJEHU(314.9)    Current Outpatient Prescriptions  Medication Sig  Dispense Refill  . acetaminophen (TYLENOL) 500 MG tablet Take 1,000 mg by mouth every 8 (eight) hours as needed for mild pain or headache.    . albuterol (PROAIR HFA) 108 (90 BASE) MCG/ACT inhaler Inhale 2 puffs into the lungs every 6 (six) hours as needed for wheezing or shortness of breath. 8.5 each 11  . ALPRAZolam (XANAX) 1 MG tablet Take 1 mg by mouth 4 (four) times daily as needed for anxiety.    . Blood Glucose Monitoring Suppl (ACCU-CHEK COMPACT CARE KIT) KIT Use to test blood glucose one time daily. Dx:250.00 1 each 0  . budesonide-formoterol (SYMBICORT) 160-4.5 MCG/ACT inhaler Inhale 2 puffs into the lungs 2 (two) times daily.    . carisoprodol (SOMA) 350 MG tablet TAKE 1 TABLET BY MOUTH 3 TIMES A DAY AS NEEDED FOR MUSCLE SPASM 105 tablet 0  . CRESTOR 10 MG tablet TAKE 1 TABLET (10 MG TOTAL) BY MOUTH DAILY. 30 tablet 1  . enalapril (VASOTEC) 2.5 MG tablet Take 1 tablet (2.5 mg total) by mouth daily. 30 tablet 5  . erythromycin ethylsuccinate (EES) 400 MG tablet Take 400 mg by mouth 2 (two) times daily. Maintenance for Rosacea    . FLUoxetine (PROZAC) 20 MG capsule Take 20 mg by mouth daily.     . fluticasone (  FLONASE) 50 MCG/ACT nasal spray Place 2 sprays into both nostrils daily.    Marland Kitchen glucose blood (ACCU-CHEK AVIVA PLUS) test strip Use as instructed to check blood sugar once daily. Dx: ICD-10 code E11.9 100 each 1  . [START ON 09/23/2015] HYDROmorphone (DILAUDID) 2 MG tablet Take 1.5 tablets (3 mg total) by mouth every 8 (eight) hours as needed for severe pain. rx 2 of 2 120 tablet 0  . ipratropium (ATROVENT HFA) 17 MCG/ACT inhaler Inhale 2 puffs into the lungs 4 (four) times daily as needed for wheezing. 12.9 g 6  . KRISTALOSE 20 G packet Take 20 g by mouth 2 (two) times daily as needed (constipation).     . metFORMIN (GLUCOPHAGE-XR) 500 MG 24 hr tablet TAKE 1 TABLET BY MOUTH TWICE A DAY 60 tablet 3  . nystatin (MYCOSTATIN) powder Apply topically two times a day to affected areas of skin  for 10 days. 15 g 1  . nystatin (MYCOSTATIN/NYSTOP) 100000 UNIT/GM POWD APPLY TOPICALLY TWO TIMES A DAY TO AFFECTED AREAS OF SKIN FOR 10 DAYS. 60 g 1  . omeprazole (PRILOSEC) 40 MG capsule Take 40 mg by mouth 2 (two) times daily.    . potassium chloride (KLOR-CON M10) 10 MEQ tablet Take 2 tablets (20 mEq total) by mouth daily. 60 tablet 5  . rOPINIRole (REQUIP) 4 MG tablet TAKE 1 TABLET BY MOUTH EVERY DAY AT BEDTIME 30 tablet 4  . rosuvastatin (CRESTOR) 10 MG tablet Take 2 tablets (20 mg total) by mouth daily. (Patient taking differently: Take 10 mg by mouth daily. ) 60 tablet 3  . SYNTHROID 88 MCG tablet TAKE 1 TABLET (88 MCG TOTAL) BY MOUTH DAILY BEFORE BREAKFAST. 30 tablet 5  . zolpidem (AMBIEN) 10 MG tablet Take 5 mg by mouth at bedtime as needed for sleep.     Marland Kitchen amitriptyline (ELAVIL) 25 MG tablet Take 1 tablet (25 mg total) by mouth at bedtime. (Patient not taking: Reported on 08/12/2015) 30 tablet 3  . esomeprazole (NEXIUM) 40 MG capsule Take 1 capsule (40 mg total) by mouth daily. 30 capsule 5  . estradiol (ESTRACE) 0.1 MG/GM vaginal cream Apply 1 gram per vagina every night for 2 weeks, then apply three times a week (Patient not taking: Reported on 08/12/2015) 30 g 3   No current facility-administered medications for this visit.   Family History  Problem Relation Age of Onset  . Heart attack Father 25  . Breast cancer Neg Hx   . Colon cancer Neg Hx   . Diabetes Mother   . Hypertension Mother   . Diabetes Sister    Social History   Social History  . Marital Status: Single    Spouse Name: N/A  . Number of Children: N/A  . Years of Education: N/A   Social History Main Topics  . Smoking status: Former Smoker -- 1.00 packs/day for 15 years    Types: Cigarettes    Quit date: 01/18/1996  . Smokeless tobacco: Never Used  . Alcohol Use: No  . Drug Use: No  . Sexual Activity: Not Currently   Other Topics Concern  . None   Social History Narrative   Review of Systems: Review  of Systems  Constitutional: Positive for malaise/fatigue. Negative for fever and chills.       Wt gain hoarseness  Respiratory: Negative for cough, shortness of breath and wheezing.   Cardiovascular: Negative for chest pain, palpitations, orthopnea and leg swelling.  Gastrointestinal: Positive for heartburn. Negative for nausea, vomiting,  abdominal pain, diarrhea and constipation.  Neurological: Positive for tingling and headaches. Negative for dizziness, focal weakness and loss of consciousness.  Psychiatric/Behavioral: Positive for depression. Negative for suicidal ideas and substance abuse. The patient is nervous/anxious and has insomnia.     Objective:  Physical Exam: Filed Vitals:   08/12/15 1416  BP: 120/71  Pulse: 79  Temp: 98.3 F (36.8 C)  TempSrc: Oral  Height: 5' 1"  (1.549 m)  Weight: 212 lb 6.4 oz (96.344 kg)  SpO2: 99%   Physical Exam  Constitutional: She is oriented to person, place, and time. She appears well-developed.  With a walker  HENT:  Head: Normocephalic and atraumatic.  Eyes: EOM are normal.  Neck: Normal range of motion. Neck supple.  Cardiovascular: Normal rate, regular rhythm and intact distal pulses.   No murmur heard. Pulmonary/Chest: Effort normal and breath sounds normal. She has no wheezes.  Abdominal: Soft. Bowel sounds are normal. She exhibits no distension. There is no tenderness.  Musculoskeletal: She exhibits tenderness.  Limited ROm of legs, shooting pain down both legs   Neurological: She is alert and oriented to person, place, and time. Coordination normal.  Psychiatric: She has a normal mood and affect.    Assessment & Plan:  Please see problem based charting for assessment and plan Case discussed with Dr Lynnae January Also filled out her placard for parking disability

## 2015-08-12 NOTE — Patient Instructions (Signed)
Thanks for your visit today We have referred you to ENT- they will call you to make appointment Please follow up with Dr Lovell Sheehan in neurosurgery  Please take the synthroid by itself  Hour before breakfast- do not take it with anything else  Please bring your meter every time you come here

## 2015-08-12 NOTE — Assessment & Plan Note (Signed)
Pt has been taking synthroid 88 mcg. Her TSH was elevated around 5 back in March She has gained about 15 pounds since last visit, feels depressed all the time, and has poor sleep, and feels cold.  PLan I have emphasized to her that she can not take the synthroid with food or any other meds- that she take synthroid first in the AM, 1 hour before breakfast. She was taking synthroid along with omeprazole and other meds at the time of the breakfast. PPIs reduce the efficacy of synthroid Continue synthoid at the current dose Repeat TSH F/u in November

## 2015-08-13 LAB — BMP8+ANION GAP
Anion Gap: 17 mmol/L (ref 10.0–18.0)
BUN/Creatinine Ratio: 14 (ref 9–23)
BUN: 9 mg/dL (ref 6–24)
CALCIUM: 9.3 mg/dL (ref 8.7–10.2)
CO2: 26 mmol/L (ref 18–29)
Chloride: 97 mmol/L (ref 97–108)
Creatinine, Ser: 0.65 mg/dL (ref 0.57–1.00)
GFR, EST AFRICAN AMERICAN: 116 mL/min/{1.73_m2} (ref >59)
GFR, EST NON AFRICAN AMERICAN: 101 mL/min/{1.73_m2} (ref >59)
Glucose: 122 mg/dL — ABNORMAL HIGH (ref 65–99)
Potassium: 4.4 mmol/L (ref 3.5–5.2)
Sodium: 140 mmol/L (ref 134–144)

## 2015-08-13 LAB — CBC
HEMATOCRIT: 37.6 % (ref 34.0–46.6)
HEMOGLOBIN: 12.6 g/dL (ref 11.1–15.9)
MCH: 29.4 pg (ref 26.6–33.0)
MCHC: 33.5 g/dL (ref 31.5–35.7)
MCV: 88 fL (ref 79–97)
Platelets: 203 10*3/uL (ref 150–379)
RBC: 4.29 x10E6/uL (ref 3.77–5.28)
RDW: 14.3 % (ref 12.3–15.4)
WBC: 6.9 10*3/uL (ref 3.4–10.8)

## 2015-08-13 LAB — FERRITIN: Ferritin: 144 ng/mL (ref 15–150)

## 2015-08-13 LAB — VITAMIN B12: Vitamin B-12: 378 pg/mL (ref 211–946)

## 2015-08-13 LAB — SEDIMENTATION RATE: Sed Rate: 5 mm/hr (ref 0–40)

## 2015-08-13 LAB — TSH: TSH: 3.6 u[IU]/mL (ref 0.450–4.500)

## 2015-08-13 NOTE — Progress Notes (Signed)
Internal Medicine Clinic Attending  I saw and evaluated the patient.  I personally confirmed the key portions of the history and exam documented by Dr. Saraiya and I reviewed pertinent patient test results.  The assessment, diagnosis, and plan were formulated together and I agree with the documentation in the resident's note.  

## 2015-08-15 LAB — METHYLMALONIC ACID, SERUM: Methylmalonic Acid: 113 nmol/L (ref 0–378)

## 2015-08-16 DIAGNOSIS — E119 Type 2 diabetes mellitus without complications: Secondary | ICD-10-CM | POA: Diagnosis not present

## 2015-08-18 ENCOUNTER — Telehealth: Payer: Self-pay | Admitting: Student in an Organized Health Care Education/Training Program

## 2015-08-18 NOTE — Telephone Encounter (Signed)
Pt requesting to get her blood test result. Please call pt back.

## 2015-08-18 NOTE — Telephone Encounter (Signed)
I called to review her lab results with her. Pt says that she has been feeling lightheaded for a month- I explained that the lab results we did last week were all within range  I urged her to call, make appt, to address her dizziness as it would be difficult to do that over the phone.  I again emphasized that she is taking a lot of dilaudid, and other medicines that can cause dizziness, but other etiologies need to be ruled out before we come to that conclusion.

## 2015-08-19 DIAGNOSIS — F331 Major depressive disorder, recurrent, moderate: Secondary | ICD-10-CM | POA: Diagnosis not present

## 2015-08-24 ENCOUNTER — Ambulatory Visit (INDEPENDENT_AMBULATORY_CARE_PROVIDER_SITE_OTHER): Payer: Commercial Managed Care - HMO | Admitting: Internal Medicine

## 2015-08-24 VITALS — BP 130/80 | HR 100 | Temp 98.4°F | Ht 61.0 in | Wt 218.6 lb

## 2015-08-24 DIAGNOSIS — M5136 Other intervertebral disc degeneration, lumbar region: Secondary | ICD-10-CM

## 2015-08-24 DIAGNOSIS — R5383 Other fatigue: Secondary | ICD-10-CM | POA: Diagnosis not present

## 2015-08-24 DIAGNOSIS — M5440 Lumbago with sciatica, unspecified side: Secondary | ICD-10-CM

## 2015-08-24 DIAGNOSIS — Z6841 Body Mass Index (BMI) 40.0 and over, adult: Secondary | ICD-10-CM

## 2015-08-24 DIAGNOSIS — R5382 Chronic fatigue, unspecified: Secondary | ICD-10-CM

## 2015-08-24 DIAGNOSIS — M5137 Other intervertebral disc degeneration, lumbosacral region: Secondary | ICD-10-CM

## 2015-08-24 MED ORDER — HYDROMORPHONE HCL 2 MG PO TABS: 2.0000 mg | ORAL_TABLET | Freq: Three times a day (TID) | ORAL | Status: DC | PRN

## 2015-08-24 MED ORDER — NALOXONE HCL 4 MG/0.1ML NA LIQD: 1.0000 "application " | Freq: Once | NASAL | Status: DC

## 2015-08-24 NOTE — Assessment & Plan Note (Signed)
Seems to be related to degenerative disc disease. See "Degenerative Disc Disease, Lumbar Spin" for assessment and plan.

## 2015-08-24 NOTE — Progress Notes (Signed)
Medication Samples have been provided to the patient.  Drug: Naloxone Strength: 2 mg/ 2 mL Qty: 1 syringe LOT: [id number] Exp.Date: 01/2016  The patient has been instructed regarding the correct time, dose, and frequency of taking this medication, including desired effects and most common side effects.   Samples approved by Drs. [employer] J 4:55 PM 08/24/2015

## 2015-08-24 NOTE — Assessment & Plan Note (Signed)
The symptoms of daytime headaches, confusion, intermittent waking, fatigue, and weakness could be explained by obstructive sleep apnea, obesity hypoventilation syndrome, or polypharmacy. Previous workup for anemia, hypothyroidism, and electrolyte abnormalities have been unremarkable.   Plan - Will order sleep study - Will decrease oral dilaudid to 2 mg po TID prn daily from 3 mg - Have asked her to bring all medications to next visit to discuss plan for weaning down medications - Have provided intranasal naloxone and explained administration instruction to sister, who was present

## 2015-08-24 NOTE — Progress Notes (Signed)
   Subjective:    Patient ID: Mary Gray, female    DOB: 05/07/1961, 54 y.o.   MRN: [id number]  HPI  Mary Gray is a 54 year old woman  who presents with a month long history of dizziness, imbalance, and light-headedness. This all started over a month ago and has progressively gotten worse. She cannot think of anything that makes it better, but it is worse when she stands up, which makes her head feels heavy." She spends a lot of time in bed, and she cannot walk to and from the mailbox without being dizzy and short of breath.  Notably, she complains of daytime headaches, especially upon awakening, and says she'll wake up about every two hours during the night. Upon awakening, she'll feel her heart racing. She continues to smoke after she eats, about a quarter pack a week. She reports adequate intake of fluid and food. She says her fasting blood sugar right when she wakes up is in the 130s (she only takes metformin). She denies any loss of consciousness or seizure activity.  Review of Systems  Constitutional: Positive for activity change and fatigue. Negative for fever and unexpected weight change.  HENT: Positive for voice change.   Respiratory: Positive for apnea and shortness of breath. Negative for chest tightness and wheezing.   Cardiovascular: Positive for leg swelling. Negative for chest pain.  Gastrointestinal: Negative for abdominal pain, diarrhea, blood in stool and abdominal distention.  Endocrine: Negative for cold intolerance.  Genitourinary: Negative for dysuria.  Musculoskeletal: Positive for back pain.  Neurological: Positive for dizziness, weakness and light-headedness.  Psychiatric/Behavioral: Negative for suicidal ideas.       Objective:   Physical Exam  Constitutional: She is oriented to person, place, and time.  Obese appearing women. No acute distress.  HENT:  Mouth/Throat: Oropharynx is clear and moist. No oropharyngeal exudate.  Eyes: EOM are normal. Pupils are  equal, round, and reactive to light. No scleral icterus.  Neck: No thyromegaly present.  Increased neck circumference.  Cardiovascular: Normal rate and regular rhythm.   Pulmonary/Chest: Effort normal and breath sounds normal. No respiratory distress.  Abdominal: Soft. Bowel sounds are normal. She exhibits no distension. There is no tenderness.  Musculoskeletal:  Trace edema  Neurological: She is alert and oriented to person, place, and time.  Skin: Skin is warm.  Skin felt moist.       Assessment & Plan:  See problem-based assessment and plan for details.

## 2015-08-24 NOTE — Patient Instructions (Addendum)
Mary Gray, it was pleasure meeting you today. It is still unclear what is causing your dizziness, but we are obtaining some additional testing to assist [place] in making a diagnosis, so that we can provide the right treatment. This will include a sleep study, and you should receive a call shortly to determine when this will be scheduled. This will evaluate for sleep apnea, and if you have sleep apnea, you may be prescribed a CPAP which will help with your nighttime breathing. We have also prescribed a nasal medication only to be used in an emergency. If you are ever found unresponsive, someone can spray it in each nostril. Then, whoever found you should call 911. You will be following up with Dr. Oswaldo Done in November 7 to discuss medication management.   Sleep Apnea Sleep apnea is disorder that affects a person's sleep. A person with sleep apnea has abnormal pauses in their breathing when they sleep. It is hard for them to get a good sleep. This makes a person tired during the day. It also can lead to other physical problems. There are three types of sleep apnea. One type is when breathing stops for a short time because your airway is blocked (obstructive sleep apnea). Another type is when the brain sometimes fails to give the normal signal to breathe to the muscles that control your breathing (central sleep apnea). The third type is a combination of the other two types. HOME CARE 1. Do not sleep on your back. Try to sleep on your side. 2. Take all medicine as told by your doctor. 3. Avoid alcohol, calming medicines (sedatives), and depressant drugs. 4. Try to lose weight if you are overweight. Talk to your doctor about a healthy weight goal. Your doctor may have you use a device that helps to open your airway. It can help you get the air that you need. It is called a positive airway pressure (PAP) device. There are three types of PAP devices: 1. Continuous positive airway pressure (CPAP) device. 2. Nasal  expiratory positive airway pressure (EPAP) device. 3. Bilevel positive airway pressure (BPAP) device. MAKE SURE YOU: 1. Understand these instructions. 2. Will watch your condition. 3. Will get help right away if you are not doing well or get worse. Document Released: 08/15/2008 Document Revised: 10/23/2012 Document Reviewed: 03/09/2012 [facility] Patient Information 2015 [place], [place]. This information is not intended to replace advice given to you by your health care provider. Make sure you discuss any questions you have with your health care provider.  Naloxone (also called Narcan) is an antidote for opioid overdose. It works by neutralizing the opioids in your system and helping you breathe again. Naloxone only works if a person has opioids in their system; the medication doesn't work on other drugs. You can't get high from it and it is safe for nearly everyone. It's a Advertising copywriter and has been used in programs all over the world.   The main sign of overdose is unresponsiveness. Other signs include:  Not breathing  Turning blue  Deep snoring  Vomiting  Gasping, gurgling  If you suspect an overdose, 4. CALL 911   5. Start rescue breathing   6. To give nasal naloxone:   If possible, put on gloves  Remove protective caps from the vial and the injector    Twist the vial into the injector until the needle penetrates the stopper, usually 3 half turns (do not push!)    Remove the cover from the injector   Twist  on the nasal device    Spray 1 mL (same as 1 mg) of naloxone into each nostril    Continue rescue breathing  Watch the patient for breathing and signs of a response  If no response in 2 to 5 minutes, give the second dose  Store naloxone at room temperature. Protect from light. Do not freeze.

## 2015-08-24 NOTE — Assessment & Plan Note (Signed)
Patient is in talks with Dr. Lovell Sheehan from [facility]. She has not yet decided whether or not she would like a corrective surgery. Her pain is managed with Soma and Dilaudid, and she has stopped taking tramadol. - Decrease hydromorphone po to 2 mg TID prn in an attempt to reduce her fatigue

## 2015-08-25 ENCOUNTER — Other Ambulatory Visit: Payer: Self-pay | Admitting: Student in an Organized Health Care Education/Training Program

## 2015-08-25 ENCOUNTER — Other Ambulatory Visit: Payer: Self-pay | Admitting: *Deleted

## 2015-08-25 NOTE — Addendum Note (Signed)
Addended by: Erlinda Hong T on: 08/25/2015 09:09 AM   Modules accepted: Level of Service

## 2015-08-25 NOTE — Progress Notes (Signed)
Internal Medicine Clinic Attending  I saw and evaluated the patient.  I personally confirmed the key portions of the history and exam documented by Dr. Ala Dach and I reviewed pertinent patient test results.  The assessment, diagnosis, and plan were formulated together and I agree with the documentation in the resident's note.  Complex patient with multiple comorbidites. I agree that she is at high risk for having obstructive sleep apnea and we will evaluate with sleep study. She is also at high risk for symptoms coming from medication side-effect given polypharmacy. I asked her to discontinue zolpidem and decrease oral dilaudid. We will work to reduce narcotic burden further at our next visit, then taper off benzo in the future. We provided her with naloxone rescue device and detailed instruction to her sister about use.

## 2015-08-25 NOTE — Addendum Note (Signed)
Addended by: Erlinda Hong T on: 08/25/2015 09:23 AM   Modules accepted: Orders

## 2015-08-25 NOTE — Addendum Note (Signed)
Addended by: Maura Crandall on: 08/25/2015 08:56 AM   Modules accepted: Orders

## 2015-08-25 NOTE — Telephone Encounter (Signed)
90 day supply please

## 2015-08-26 MED ORDER — POTASSIUM CHLORIDE CRYS ER 10 MEQ PO TBCR: 20.0000 meq | EXTENDED_RELEASE_TABLET | Freq: Every day | ORAL | Status: DC

## 2015-08-26 MED ORDER — ENALAPRIL MALEATE 2.5 MG PO TABS: 2.5000 mg | ORAL_TABLET | Freq: Every day | ORAL | Status: DC

## 2015-08-26 MED ORDER — METFORMIN HCL ER 500 MG PO TB24: 500.0000 mg | ORAL_TABLET | Freq: Two times a day (BID) | ORAL | Status: DC

## 2015-08-26 MED ORDER — ROSUVASTATIN CALCIUM 10 MG PO TABS: 10.0000 mg | ORAL_TABLET | Freq: Every day | ORAL | Status: DC

## 2015-08-26 NOTE — Telephone Encounter (Signed)
Pt informed that refill denied.  Pt is aware of appointment on 09/27/15

## 2015-09-01 ENCOUNTER — Other Ambulatory Visit: Payer: Self-pay | Admitting: Internal Medicine

## 2015-09-12 ENCOUNTER — Other Ambulatory Visit: Payer: Self-pay | Admitting: Internal Medicine

## 2015-09-14 DIAGNOSIS — J383 Other diseases of vocal cords: Secondary | ICD-10-CM | POA: Diagnosis not present

## 2015-09-14 DIAGNOSIS — R490 Dysphonia: Secondary | ICD-10-CM | POA: Diagnosis not present

## 2015-09-14 DIAGNOSIS — H9203 Otalgia, bilateral: Secondary | ICD-10-CM | POA: Diagnosis not present

## 2015-09-14 DIAGNOSIS — M26623 Arthralgia of bilateral temporomandibular joint: Secondary | ICD-10-CM | POA: Diagnosis not present

## 2015-09-27 ENCOUNTER — Encounter: Payer: Commercial Managed Care - HMO | Admitting: Dietician

## 2015-09-27 ENCOUNTER — Ambulatory Visit: Payer: Commercial Managed Care - HMO | Admitting: Student in an Organized Health Care Education/Training Program

## 2015-10-12 ENCOUNTER — Encounter: Payer: Self-pay | Admitting: Student

## 2015-10-27 NOTE — Addendum Note (Signed)
Addended by: Neomia DearPOWERS, Cherrelle Plante E on: 10/27/2015 06:16 PM   Modules accepted: Orders

## 2015-10-28 ENCOUNTER — Ambulatory Visit (HOSPITAL_BASED_OUTPATIENT_CLINIC_OR_DEPARTMENT_OTHER): Payer: Commercial Managed Care - HMO | Attending: Oncology | Admitting: *Deleted

## 2015-10-28 DIAGNOSIS — R5383 Other fatigue: Secondary | ICD-10-CM | POA: Insufficient documentation

## 2015-10-28 DIAGNOSIS — G4730 Sleep apnea, unspecified: Secondary | ICD-10-CM | POA: Diagnosis not present

## 2015-10-28 DIAGNOSIS — E119 Type 2 diabetes mellitus without complications: Secondary | ICD-10-CM | POA: Diagnosis not present

## 2015-10-28 DIAGNOSIS — R0683 Snoring: Secondary | ICD-10-CM | POA: Insufficient documentation

## 2015-10-28 DIAGNOSIS — Z79899 Other long term (current) drug therapy: Secondary | ICD-10-CM | POA: Insufficient documentation

## 2015-10-28 DIAGNOSIS — R5382 Chronic fatigue, unspecified: Secondary | ICD-10-CM

## 2015-10-28 NOTE — Addendum Note (Signed)
Addended by: Neomia DearPOWERS, Ardis Lawley E on: 10/28/2015 04:18 PM   Modules accepted: Orders

## 2015-11-06 DIAGNOSIS — R5382 Chronic fatigue, unspecified: Secondary | ICD-10-CM | POA: Diagnosis not present

## 2015-11-06 NOTE — Progress Notes (Signed)
  Patient Name: Mary Gray, Sara Study Date: 10/28/2015 Gender: Female D.O.B: April 28, 1961 Age (years): 54 Referring Provider: Cephas DarbyJames Granfortuna Height (inches): 61 Interpreting Physician: Jetty Duhamellinton Young MD, ABSM Weight (lbs): 218 RPSGT: Elaina Patteeeeriemer, Holly BMI: 41 MRN: [id number] Neck Size: 17.00 CLINICAL INFORMATION Sleep Study Type: NPSG Indication for sleep study: Diabetes, Fatigue, Snoring, Witnessed Apneas Epworth Sleepiness Score: 9 SLEEP STUDY TECHNIQUE As per the AASM Manual for the Scoring of Sleep and Associated Events v2.3 (April 2016) with a hypopnea requiring 4% desaturations. The channels recorded and monitored were frontal, central and occipital EEG, electrooculogram (EOG), submentalis EMG (chin), nasal and oral airflow, thoracic and abdominal wall motion, anterior tibialis EMG, snore microphone, electrocardiogram, and pulse oximetry.  MEDICATIONS Patient's medications include: charted for review. Medications self-administered by patient during sleep study : alprazolam, amitriptyline, carisoprodol, zolpidem, ropinirole, rosuvastatin  SLEEP ARCHITECTURE The study was initiated at 10:31:48 PM and ended at 4:50:56 AM. Sleep onset time was 8.1 minutes and the sleep efficiency was 78.5%. The total sleep time was 297.5 minutes. Stage REM latency was N/A minutes. The patient spent 3.87% of the night in stage N1 sleep, 96.13% in stage N2 sleep, 0.00% in stage N3 and 0.00% in REM. Alpha intrusion was absent. Supine sleep was 76.97%. Wake after sleep onset 73 minutes  RESPIRATORY PARAMETERS The overall apnea/hypopnea index (AHI) was 0.4 per hour. There were 1 total apneas, including 0 obstructive, 1 central and 0 mixed apneas. There were 1 hypopneas and 0 RERAs. The AHI during Stage REM sleep was N/A per hour. AHI while supine was 0.5 per hour. The mean oxygen saturation was 92.61%. The minimum SpO2 during sleep was 88.00%. Moderate snoring was noted during this study.  CARDIAC  DATA The 2 lead EKG demonstrated sinus rhythm. The mean heart rate was 78.06 beats per minute. Other EKG findings include: None.  LEG MOVEMENT DATA The total PLMS were 0 with a resulting PLMS index of 0.00. Associated arousal with leg movement index was 0.0 .  IMPRESSIONS - No significant obstructive sleep apnea occurred during this study (AHI = 0.4/h). - No significant central sleep apnea occurred during this study (CAI = 0.2/h). - The patient had minimal or no oxygen desaturation during the study (Min O2 = 88.00%) - The patient snored with Moderate snoring volume. - No cardiac abnormalities were noted during this study. - Clinically significant periodic limb movements did not occur during sleep. No significant associated arousals. - Sleep pattern with predominantly stage N2 and absent REM is consistent with the extensive medication list entered above.  DIAGNOSIS - Primary Snoring (786.09 [R06.83 ICD-10])  RECOMMENDATIONS - Avoid alcohol, sedatives and other CNS depressants that may worsen sleep apnea and disrupt normal sleep architecture. - Sleep hygiene should be reviewed to assess factors that may improve sleep quality. - Weight management and regular exercise should be initiated or continued if appropriate.  Waymon BudgeYOUNG,CLINTON D Diplomate, American Board of Sleep Medicine  ELECTRONICALLY SIGNED ON:  11/06/2015, 10:52 AM [facility] PH: [phone]   FX: [phone] ACCREDITED BY THE AMERICAN ACADEMY OF SLEEP MEDICINE

## 2015-12-08 ENCOUNTER — Telehealth: Payer: Self-pay | Admitting: Student in an Organized Health Care Education/Training Program

## 2015-12-08 ENCOUNTER — Other Ambulatory Visit: Payer: Self-pay | Admitting: *Deleted

## 2015-12-08 DIAGNOSIS — F332 Major depressive disorder, recurrent severe without psychotic features: Secondary | ICD-10-CM | POA: Diagnosis not present

## 2015-12-08 DIAGNOSIS — F331 Major depressive disorder, recurrent, moderate: Secondary | ICD-10-CM | POA: Diagnosis not present

## 2015-12-08 MED ORDER — GLUCOSE BLOOD VI STRP: ORAL_STRIP | Status: DC

## 2015-12-08 NOTE — Telephone Encounter (Signed)
Called and informed pt, she was pleased

## 2015-12-08 NOTE — Telephone Encounter (Signed)
Pt requesting sleep study result. Also want refill on test strips.

## 2015-12-08 NOTE — Telephone Encounter (Signed)
Please call back and thank her for completing the sleep study last month. From my view of the report it looks like she did well. Please tell her that her oxygen stayed within a normal range for the night and she did not have a significant amount of apnea or hypopnea. It does not seem she will need a CPAP machine based on this study.

## 2015-12-21 DIAGNOSIS — E119 Type 2 diabetes mellitus without complications: Secondary | ICD-10-CM | POA: Diagnosis not present

## 2015-12-31 DIAGNOSIS — M5136 Other intervertebral disc degeneration, lumbar region: Secondary | ICD-10-CM | POA: Diagnosis not present

## 2016-01-04 ENCOUNTER — Other Ambulatory Visit: Payer: Self-pay | Admitting: Neurosurgery

## 2016-01-04 DIAGNOSIS — M5136 Other intervertebral disc degeneration, lumbar region: Secondary | ICD-10-CM

## 2016-01-04 DIAGNOSIS — M51369 Other intervertebral disc degeneration, lumbar region without mention of lumbar back pain or lower extremity pain: Secondary | ICD-10-CM

## 2016-01-05 ENCOUNTER — Other Ambulatory Visit: Payer: Self-pay

## 2016-01-06 MED ORDER — METFORMIN HCL ER 500 MG PO TB24: 500.0000 mg | ORAL_TABLET | Freq: Two times a day (BID) | ORAL | Status: DC

## 2016-01-08 ENCOUNTER — Telehealth: Payer: Self-pay | Admitting: Internal Medicine

## 2016-01-08 NOTE — Telephone Encounter (Signed)
  INTERNAL MEDICINE RESIDENCY PROGRAM After-Hours Telephone Call    Reason for call:   I received a call from [name] at 2:30 PM, 01/08/2016 indicating stating she has a URI. She is coughing up a lot of phlegm, aching all over, temperature up to 103. She feels like she has pneumonia. Patient feels more short of breath, wheezing, nauseous, nasal congestion, lightheaded. She has had a decreased appetite, but is trying to stay hydrated with liquids. She got her flu shot this year. She denies vomiting. She denies any sick contacts. For her symptoms, she has been drinking water, eating chicken noodle soup, using her albuterol inhaler more often than normal, and mucinex without relief.     Pertinent Data:   As above    Assessment / Plan / Recommendations:   DDx includes viral URI (most likely), influenza, or pneumonia. Patient is at risk for developing an asthma exacerbation given her history and having to use her inhaler more often today due to shortness of breath and wheezing. Patient sound congested and sick on the phone, but she is speaking in complete sentences without shortness of breath. Unfortunately, given it is a Saturday, patient cannot be seen in our clinic. I recommend that she go to Urgent Care to be evaluated more fully - vital signs, oxygenation level, possible CXR. If influenza test negative, CXR clear, and vitals are stable, patient can likely go home for supportive care for a viral URI.  As always, pt is advised that if symptoms worsen or new symptoms arise, they should go to an urgent care facility or to to ER for further evaluation.  Patient voices agreement with the above plan.   Jill Alexanders, DO PGY-2 Internal Medicine Resident Pager # [phone] 01/08/2016 2:42 PM

## 2016-01-10 ENCOUNTER — Encounter (HOSPITAL_COMMUNITY): Payer: Self-pay

## 2016-01-10 ENCOUNTER — Emergency Department (HOSPITAL_COMMUNITY)
Admission: EM | Admit: 2016-01-10 | Discharge: 2016-01-10 | Disposition: A | Discharge status: Home / Self Care | Payer: Commercial Managed Care - HMO | Attending: Emergency Medicine | Admitting: Emergency Medicine

## 2016-01-10 DIAGNOSIS — J111 Influenza due to unidentified influenza virus with other respiratory manifestations: Secondary | ICD-10-CM | POA: Insufficient documentation

## 2016-01-10 DIAGNOSIS — Z79899 Other long term (current) drug therapy: Secondary | ICD-10-CM | POA: Diagnosis not present

## 2016-01-10 DIAGNOSIS — E119 Type 2 diabetes mellitus without complications: Secondary | ICD-10-CM | POA: Insufficient documentation

## 2016-01-10 DIAGNOSIS — Z872 Personal history of diseases of the skin and subcutaneous tissue: Secondary | ICD-10-CM | POA: Diagnosis not present

## 2016-01-10 DIAGNOSIS — Z8619 Personal history of other infectious and parasitic diseases: Secondary | ICD-10-CM | POA: Insufficient documentation

## 2016-01-10 DIAGNOSIS — E669 Obesity, unspecified: Secondary | ICD-10-CM | POA: Insufficient documentation

## 2016-01-10 DIAGNOSIS — F329 Major depressive disorder, single episode, unspecified: Secondary | ICD-10-CM | POA: Diagnosis not present

## 2016-01-10 DIAGNOSIS — J449 Chronic obstructive pulmonary disease, unspecified: Secondary | ICD-10-CM | POA: Diagnosis not present

## 2016-01-10 DIAGNOSIS — I10 Essential (primary) hypertension: Secondary | ICD-10-CM | POA: Insufficient documentation

## 2016-01-10 DIAGNOSIS — Z862 Personal history of diseases of the blood and blood-forming organs and certain disorders involving the immune mechanism: Secondary | ICD-10-CM | POA: Insufficient documentation

## 2016-01-10 DIAGNOSIS — Z880 Allergy status to penicillin: Secondary | ICD-10-CM | POA: Insufficient documentation

## 2016-01-10 DIAGNOSIS — Z8701 Personal history of pneumonia (recurrent): Secondary | ICD-10-CM | POA: Diagnosis not present

## 2016-01-10 DIAGNOSIS — Z8742 Personal history of other diseases of the female genital tract: Secondary | ICD-10-CM | POA: Insufficient documentation

## 2016-01-10 DIAGNOSIS — E039 Hypothyroidism, unspecified: Secondary | ICD-10-CM | POA: Insufficient documentation

## 2016-01-10 DIAGNOSIS — E7800 Pure hypercholesterolemia, unspecified: Secondary | ICD-10-CM | POA: Diagnosis not present

## 2016-01-10 DIAGNOSIS — R0981 Nasal congestion: Secondary | ICD-10-CM | POA: Diagnosis present

## 2016-01-10 DIAGNOSIS — Z8739 Personal history of other diseases of the musculoskeletal system and connective tissue: Secondary | ICD-10-CM | POA: Diagnosis not present

## 2016-01-10 DIAGNOSIS — Z7951 Long term (current) use of inhaled steroids: Secondary | ICD-10-CM | POA: Diagnosis not present

## 2016-01-10 DIAGNOSIS — K219 Gastro-esophageal reflux disease without esophagitis: Secondary | ICD-10-CM | POA: Insufficient documentation

## 2016-01-10 DIAGNOSIS — Z87891 Personal history of nicotine dependence: Secondary | ICD-10-CM | POA: Diagnosis not present

## 2016-01-10 DIAGNOSIS — F419 Anxiety disorder, unspecified: Secondary | ICD-10-CM | POA: Insufficient documentation

## 2016-01-10 DIAGNOSIS — Z792 Long term (current) use of antibiotics: Secondary | ICD-10-CM | POA: Diagnosis not present

## 2016-01-10 DIAGNOSIS — Z7984 Long term (current) use of oral hypoglycemic drugs: Secondary | ICD-10-CM | POA: Insufficient documentation

## 2016-01-10 DIAGNOSIS — R69 Illness, unspecified: Secondary | ICD-10-CM

## 2016-01-10 DIAGNOSIS — R05 Cough: Secondary | ICD-10-CM | POA: Diagnosis not present

## 2016-01-10 MED ORDER — GUAIFENESIN 100 MG/5ML PO SYRP: 100.0000 mg | ORAL_SOLUTION | ORAL | Status: DC | PRN

## 2016-01-10 NOTE — ED Notes (Signed)
Patient here with cough and congestion with body aches x 3 days

## 2016-01-10 NOTE — ED Provider Notes (Signed)
CSN: [id number]     Arrival date & time 01/10/16  1497 History   First MD Initiated Contact with Patient 01/10/16 1219     Chief Complaint  Patient presents with  . Cough  . Nasal Congestion     (Consider location/radiation/quality/duration/timing/severity/associated sxs/prior Treatment) HPI [name] is a 55 year old female with a history of diabetes, hypertension, anxiety, GERD, and obesity who comes in today complaining of nasal congestion, sore throat, and cough for several days. Diffuse bodyaches and says that she had a temperature to 100.4 last night. She has been taking over-the-counter medication. She's been using her albuterol as prescribed. She is taking by mouth fluids without difficulty. She has some cough but is not productive. Her dyspnea is at baseline. Past Medical History  Diagnosis Date  . Diabetes mellitus, type 2 ([place])   . Hypertension   . Unspecified asthma(493.90)   . Anxiety state, unspecified   . Hypercholesteremia   . Hypothyroidism   . Obesity   . GERD (gastroesophageal reflux disease)   . Iron deficiency anemia   . DDD (degenerative disc disease), lumbar   . Spinal stenosis   . Low back pain   . Degeneration of cervical intervertebral disc   . Left knee pain     Following fall 11/15/2009.  Marland Kitchen Acne rosacea   . Heme positive stool     EGD on 04/08/2010 by Dr. Watt Climes showed chronic gastritis and a few gastric polyps; pathology showed a fundic gland polyp.  Colonoscopy on 04/08/2010 showed small external and internal hemorrhoids, and a few benign-appearing diminutive polyps in the rectum, the distal sigmoid colon, and in the distal descending colon; pathology showed hyperplastic polyps.   . Hematochezia   . Irregular menses   . Vagina bleeding   . Atrophic vaginitis   . Viral warts, unspecified     Right hand.  . Chronic interstitial cystitis     Followed by Dr. Franchot Gallo  . Elevated transaminase level     Mild elevation, AST=48, ALT=51 on  02/01/07.  . Allergic rhinitis   . Carpal tunnel syndrome   . Atypical chest pain 12/11/2008    Normal stress nuclear study 01/12/2009 by Dr. Peter Martinique  . Bilateral leg edema   . COPD (chronic obstructive pulmonary disease) ([place])   . Dysrhythmia   . Depression   . Shortness of breath   . Pneumonia   . WYOVZCHY(850.2)    Past Surgical History  Procedure Laterality Date  . Carpal tunnel release  05/08/2000    By Dr. Youlanda Mighty. Sypher, Brooke Bonito.   Family History  Problem Relation Age of Onset  . Heart attack Father 96  . Breast cancer Neg Hx   . Colon cancer Neg Hx   . Diabetes Mother   . Hypertension Mother   . Diabetes Sister    Social History  Substance Use Topics  . Smoking status: Former Smoker -- 1.00 packs/day for 15 years    Types: Cigarettes    Quit date: 01/18/1996  . Smokeless tobacco: Never Used  . Alcohol Use: No   OB History    No data available     Review of Systems  All other systems reviewed and are negative.     Allergies  Codeine sulfate; Meperidine and related; Minocycline hcl; Penicillins; Propoxyphene n-acetaminophen; and Tetracycline hcl  Home Medications   Prior to Admission medications   Medication Sig Start Date End Date Taking? Authorizing Provider  acetaminophen (TYLENOL) 500 MG tablet Take 1,000 mg  by mouth every 8 (eight) hours as needed for mild pain or headache.    Historical Provider, MD  albuterol (PROAIR HFA) 108 (90 BASE) MCG/ACT inhaler Inhale 2 puffs into the lungs every 6 (six) hours as needed for wheezing or shortness of breath. 05/25/14   Bertha Stakes, MD  ALPRAZolam Duanne Moron) 1 MG tablet Take 1 mg by mouth 4 (four) times daily as needed for anxiety.    Historical Provider, MD  amitriptyline (ELAVIL) 25 MG tablet Take 1 tablet (25 mg total) by mouth at bedtime. Patient taking differently: Take 50 mg by mouth at bedtime.  08/27/14   Bertha Stakes, MD  Blood Glucose Monitoring Suppl (ACCU-CHEK COMPACT CARE KIT) KIT Use to test blood  glucose one time daily. Dx:250.00 03/18/14   Bertha Stakes, MD  budesonide-formoterol [facility]) 160-4.5 MCG/ACT inhaler Inhale 2 puffs into the lungs 2 (two) times daily.    Historical Provider, MD  carisoprodol (SOMA) 350 MG tablet TAKE 1 TABLET BY MOUTH 3 TIMES A DAY AS NEEDED FOR MUSCLE SPASM 07/27/15   Axel Filler, MD  enalapril (VASOTEC) 2.5 MG tablet Take 1 tablet (2.5 mg total) by mouth daily. 08/26/15   Axel Filler, MD  erythromycin ethylsuccinate (EES) 400 MG tablet Take 400 mg by mouth 2 (two) times daily. Maintenance for Rosacea    Historical Provider, MD  esomeprazole (NEXIUM) 40 MG capsule Take 1 capsule (40 mg total) by mouth daily. Patient not taking: Reported on 08/12/2015 08/12/15   Burgess Estelle, MD  esomeprazole (NEXIUM) 40 MG capsule TAKE ONE CAPSULE BY MOUTH EVERY DAY 09/13/15   Axel Filler, MD  estradiol (ESTRACE) 0.1 MG/GM vaginal cream Apply 1 gram per vagina every night for 2 weeks, then apply three times a week Patient not taking: Reported on 08/12/2015 11/24/14   Donnamae Jude, MD  FLUoxetine (PROZAC) 20 MG capsule Take 20 mg by mouth daily.  11/15/13   Historical Provider, MD  fluticasone (FLONASE) 50 MCG/ACT nasal spray Place 2 sprays into both nostrils daily.    Historical Provider, MD  fluticasone [facility]) 50 MCG/ACT nasal spray USE 2 SPRAYS IN EACH NOSTRIL ONCE A DAY 09/02/15   Axel Filler, MD  glucose blood (ACCU-CHEK COMPACT TEST DRUM) test strip Use to check blood sugar 1 time daily. diag code E11.9. Non insulin dependent 12/08/15   Axel Filler, MD  HYDROmorphone (DILAUDID) 2 MG tablet Take 1 tablet (2 mg total) by mouth every 8 (eight) hours as needed for severe pain. rx 2 of 2 09/23/15   Liberty Handy, MD  ipratropium (ATROVENT HFA) 17 MCG/ACT inhaler Inhale 2 puffs into the lungs 4 (four) times daily as needed for wheezing. 10/09/14   Bertha Stakes, MD  KRISTALOSE 20 G packet Take 20 g by mouth 2 (two) times daily as needed  (constipation).  05/13/12   Historical Provider, MD  metFORMIN (GLUCOPHAGE-XR) 500 MG 24 hr tablet Take 1 tablet (500 mg total) by mouth 2 (two) times daily. 01/06/16   Axel Filler, MD  Naloxone HCl 4 MG/0.1ML LIQD Place 1 application into the nose once. 08/24/15   Liberty Handy, MD  nystatin (MYCOSTATIN) powder Apply topically two times a day to affected areas of skin for 10 days. 08/12/15   Burgess Estelle, MD  nystatin (MYCOSTATIN/NYSTOP) 100000 UNIT/GM POWD APPLY TOPICALLY TWO TIMES A DAY TO AFFECTED AREAS OF SKIN FOR 10 DAYS. 05/14/15   Sid Falcon, MD  omeprazole (PRILOSEC) 40 MG capsule Take 40 mg by mouth  2 (two) times daily.    Historical Provider, MD  potassium chloride (KLOR-CON M10) 10 MEQ tablet Take 2 tablets (20 mEq total) by mouth daily. 08/26/15   Axel Filler, MD  rOPINIRole (REQUIP) 4 MG tablet TAKE 1 TABLET BY MOUTH EVERY DAY AT BEDTIME 11/19/14   Bronson Ing, DPM  rosuvastatin (CRESTOR) 10 MG tablet Take 2 tablets (20 mg total) by mouth daily. Patient taking differently: Take 10 mg by mouth daily.  02/09/15   Bertha Stakes, MD  rosuvastatin (CRESTOR) 10 MG tablet Take 1 tablet (10 mg total) by mouth daily. 08/26/15   Axel Filler, MD  SYNTHROID 88 MCG tablet TAKE 1 TABLET (88 MCG TOTAL) BY MOUTH DAILY BEFORE BREAKFAST. 07/28/15   Axel Filler, MD  SYNTHROID 88 MCG tablet TAKE 1 TABLET (88 MCG TOTAL) BY MOUTH DAILY BEFORE BREAKFAST. 09/13/15   Axel Filler, MD  zolpidem (AMBIEN) 10 MG tablet Take 5 mg by mouth at bedtime as needed for sleep.     Historical Provider, MD   BP 125/87 mmHg  Pulse 89  Temp(Src) 99.1 F (37.3 C) (Oral)  Resp 16  Ht _0  (1.549 m)  Wt 90.719 kg  BMI 37.81 kg/m2  SpO2 90%  LMP 01/17/2009 Physical Exam  Constitutional: She is oriented to person, place, and time. She appears well-developed and well-nourished.  On my exam, patient does not appear dyspneic. I reviewed her oxygen saturations at 90% at triage  but recheck them here without any intervention in her sats are 94%.  HENT:  Head: Normocephalic and atraumatic.  Right Ear: External ear normal.  Left Ear: External ear normal.  Nose: Nose normal.  Mouth/Throat: Oropharynx is clear and moist.  Eyes: Conjunctivae and EOM are normal. Pupils are equal, round, and reactive to light.  Neck: Normal range of motion. Neck supple.  Cardiovascular: Normal rate, regular rhythm, normal heart sounds and intact distal pulses.   Pulmonary/Chest: Effort normal and breath sounds normal.  Abdominal: Soft. Bowel sounds are normal.  Musculoskeletal: Normal range of motion.  Neurological: She is alert and oriented to person, place, and time. She has normal reflexes.  Skin: Skin is warm and dry.  Psychiatric: She has a normal mood and affect. Her behavior is normal. Judgment and thought content normal.  Nursing note and vitals reviewed.   ED Course  Procedures (including critical care time) Labs Review Labs Reviewed - No data to display  Imaging Review No results found. I have personally reviewed and evaluated these images and lab results as part of my medical decision-making.   EKG Interpretation None      MDM   Final diagnoses:  Influenza-like illness    Patient encouraged to continue her albuterol. She is given guaifenesin for cough. She does not have any active wheezing and I am not starting prednisone as she is a diabetic. I also discussed that her symptoms have been going on for 72 hours and Tamiflu is of low value in this scenario. We discussed return precautions and need for follow-up and she voiced understanding    Pattricia Boss, MD 01/10/16 1237

## 2016-01-10 NOTE — Discharge Instructions (Signed)
You have an influenza like illness.  Continue your albuterol inhaler every four hours.  Return if you are worse such as shortness of breath or unable to keep fluids down.     Influenza, Adult Influenza (flu) is an infection in the mouth, nose, and throat (respiratory tract) caused by a virus. The flu can make you feel very ill. Influenza spreads easily from person to person (contagious).  HOME CARE   Only take medicines as told by your doctor.  Use a cool mist humidifier to make breathing easier.  Get plenty of rest until your fever goes away. This usually takes 3 to 4 days.  Drink enough fluids to keep your pee (urine) clear or pale yellow.  Cover your mouth and nose when you cough or sneeze.  Wash your hands well to avoid spreading the flu.  Stay home from work or school until your fever has been gone for at least 1 full day.  Get a flu shot every year. GET HELP RIGHT AWAY IF:   You have trouble breathing or feel short of breath.  Your skin or nails turn blue.  You have severe neck pain or stiffness.  You have a severe headache, facial pain, or earache.  Your fever gets worse or keeps coming back.  You feel sick to your stomach (nauseous), throw up (vomit), or have watery poop (diarrhea).  You have chest pain.  You have a deep cough that gets worse, or you cough up more thick spit (mucus). MAKE SURE YOU:   Understand these instructions.  Will watch your condition.  Will get help right away if you are not doing well or get worse.   This information is not intended to replace advice given to you by your health care provider. Make sure you discuss any questions you have with your health care provider.   Document Released: 08/15/2008 Document Revised: 11/27/2014 Document Reviewed: 02/05/2012 Elsevier Interactive Patient Education [employer]

## 2016-01-24 ENCOUNTER — Other Ambulatory Visit: Payer: Commercial Managed Care - HMO

## 2016-01-25 ENCOUNTER — Other Ambulatory Visit: Payer: Self-pay | Admitting: Student in an Organized Health Care Education/Training Program

## 2016-01-31 ENCOUNTER — Other Ambulatory Visit: Payer: Self-pay | Admitting: Student in an Organized Health Care Education/Training Program

## 2016-01-31 ENCOUNTER — Telehealth: Payer: Self-pay | Admitting: Student in an Organized Health Care Education/Training Program

## 2016-01-31 NOTE — Telephone Encounter (Signed)
APPT. REMINDER CALL, LMTCB °

## 2016-02-01 ENCOUNTER — Encounter: Payer: Self-pay | Admitting: Student in an Organized Health Care Education/Training Program

## 2016-02-01 ENCOUNTER — Telehealth: Payer: Self-pay | Admitting: *Deleted

## 2016-02-01 ENCOUNTER — Ambulatory Visit: Payer: Commercial Managed Care - HMO | Admitting: Student in an Organized Health Care Education/Training Program

## 2016-02-01 NOTE — Telephone Encounter (Signed)
Pt has lost her crestor, she does not need another script for crestor as the insurance will not let her have anymore til may, pharm had given her 90 days worth, they are asking if there is another medication you can prescribe until time for for refill of crestor, please advise

## 2016-02-02 MED ORDER — PRAVASTATIN SODIUM 10 MG PO TABS: 10.0000 mg | ORAL_TABLET | Freq: Every evening | ORAL | Status: DC

## 2016-02-02 NOTE — Telephone Encounter (Signed)
Lm for rtc 

## 2016-02-02 NOTE — Telephone Encounter (Signed)
We can switch to Pravastatin 10 daily. Rx sent. I cant ensure insurance will cover in this case, but they may. I wish she had kept her appt with me yesterday.

## 2016-02-03 ENCOUNTER — Other Ambulatory Visit: Payer: Self-pay | Admitting: *Deleted

## 2016-02-03 ENCOUNTER — Telehealth: Payer: Self-pay | Admitting: Student in an Organized Health Care Education/Training Program

## 2016-02-03 DIAGNOSIS — M5440 Lumbago with sciatica, unspecified side: Secondary | ICD-10-CM

## 2016-02-03 NOTE — Telephone Encounter (Signed)
Pt called back, requesting hydrocodone to be filled. Please call pt back.

## 2016-02-03 NOTE — Telephone Encounter (Signed)
Have called once again, no answer, lm

## 2016-02-03 NOTE — Telephone Encounter (Signed)
Spoke to pt on phone, she has appt Monday, she was under the impression that crestor was pain med/ muscle relax not for lipid, when she was explained the difference she stated she did not need the crestor she needed painmed and muscle relaxant, she was informed that per dr Oswaldo Donevincent she needed to keep her appts, she states she has appt Monday which she does w/ dr Tasia Catchingsahmed, she was informed that dr Oswaldo Donevincent might prefer that she come to appt and be given scripts at that time but it is up to dr Oswaldo Donevincent to approve the refills. She states she will be at appt

## 2016-02-04 ENCOUNTER — Telehealth: Payer: Self-pay | Admitting: Student in an Organized Health Care Education/Training Program

## 2016-02-04 NOTE — Telephone Encounter (Signed)
APPT. REMINDER CALL, LMTCB °

## 2016-02-04 NOTE — Telephone Encounter (Signed)
Negative. I last saw this patient five months ago with complaints consistent with polypharmacy and too many centrally acting meds. We stopped zolpidem and gave a one month supply of dilaudid. We told her to see me in one month for all refills, and she hasn't been back since. Review of the database shows she has been receiving zolpidem and alprazolam from a physician in [place]. She has not refilled carisoprodol since September, so I would not recommend continuing this.  Long story short, I don't think this patient should receive refills outside of a clinical visit. She should have symptoms of side effects carefully assessed, have a tox screen, and receive a one month supply with follow up in one month. Please schedule her for my next available, I think 5/22 when I return from leave. Please ask a resident to see her in April to assess side effects again.

## 2016-02-04 NOTE — Telephone Encounter (Signed)
Refused, see dr vincent's note

## 2016-02-07 ENCOUNTER — Encounter: Payer: Self-pay | Admitting: Internal Medicine

## 2016-02-07 ENCOUNTER — Ambulatory Visit (INDEPENDENT_AMBULATORY_CARE_PROVIDER_SITE_OTHER): Payer: Commercial Managed Care - HMO | Admitting: Internal Medicine

## 2016-02-07 VITALS — BP 140/86 | HR 80 | Temp 98.1°F | Resp 18 | Ht 61.0 in | Wt 226.1 lb

## 2016-02-07 DIAGNOSIS — Z79891 Long term (current) use of opiate analgesic: Secondary | ICD-10-CM

## 2016-02-07 DIAGNOSIS — Z6841 Body Mass Index (BMI) 40.0 and over, adult: Secondary | ICD-10-CM

## 2016-02-07 DIAGNOSIS — G8929 Other chronic pain: Secondary | ICD-10-CM

## 2016-02-07 DIAGNOSIS — I10 Essential (primary) hypertension: Secondary | ICD-10-CM

## 2016-02-07 DIAGNOSIS — M545 Low back pain: Secondary | ICD-10-CM

## 2016-02-07 DIAGNOSIS — E669 Obesity, unspecified: Secondary | ICD-10-CM

## 2016-02-07 DIAGNOSIS — Z7984 Long term (current) use of oral hypoglycemic drugs: Secondary | ICD-10-CM | POA: Diagnosis not present

## 2016-02-07 DIAGNOSIS — E1142 Type 2 diabetes mellitus with diabetic polyneuropathy: Secondary | ICD-10-CM | POA: Diagnosis not present

## 2016-02-07 DIAGNOSIS — M5440 Lumbago with sciatica, unspecified side: Secondary | ICD-10-CM

## 2016-02-07 DIAGNOSIS — E1140 Type 2 diabetes mellitus with diabetic neuropathy, unspecified: Secondary | ICD-10-CM

## 2016-02-07 DIAGNOSIS — E118 Type 2 diabetes mellitus with unspecified complications: Secondary | ICD-10-CM

## 2016-02-07 DIAGNOSIS — E1165 Type 2 diabetes mellitus with hyperglycemia: Secondary | ICD-10-CM

## 2016-02-07 DIAGNOSIS — M7552 Bursitis of left shoulder: Secondary | ICD-10-CM | POA: Diagnosis not present

## 2016-02-07 DIAGNOSIS — Z79899 Other long term (current) drug therapy: Secondary | ICD-10-CM

## 2016-02-07 LAB — GLUCOSE, CAPILLARY: GLUCOSE-CAPILLARY: 192 mg/dL — AB (ref 65–99)

## 2016-02-07 LAB — POCT GLYCOSYLATED HEMOGLOBIN (HGB A1C): Hemoglobin A1C: 8.3

## 2016-02-07 MED ORDER — HYDROMORPHONE HCL 2 MG PO TABS: 2.0000 mg | ORAL_TABLET | Freq: Four times a day (QID) | ORAL | Status: DC | PRN

## 2016-02-07 MED ORDER — NYSTATIN 100000 UNIT/GM EX POWD: CUTANEOUS | Status: DC

## 2016-02-07 NOTE — Patient Instructions (Signed)
General Instructions:   Please bring your medicines with you each time you come to clinic.  Medicines may include prescription medications, over-the-counter medications, herbal remedies, eye drops, vitamins, or other pills.   Progress Toward Treatment Goals:  Treatment Goal 02/09/2015  Hemoglobin A1C at goal  Blood pressure at goal    Self Care Goals & Plans:  Self Care Goal 02/09/2015  Manage my medications bring my medications to every visit; take my medicines as prescribed  Monitor my health keep track of my blood glucose; check my feet daily; bring my glucose meter and log to each visit  Eat healthy foods eat foods that are low in salt; eat baked foods instead of fried foods  Be physically active find an activity I enjoy  Meeting treatment goals maintain the current self-care plan    Home Blood Glucose Monitoring 02/09/2015  Check my blood sugar once a day  When to check my blood sugar before breakfast     Care Management & Community Referrals:  Referral 02/09/2015  Referrals made for care management support none needed  Referrals made to community resources none

## 2016-02-07 NOTE — Progress Notes (Signed)
[facility] Subjective:   Patient ID: Mary Gray female   DOB: [birth year] 55 y.o.   MRN: [id number]  HPI: [name] is a 55 y.o. female with a PMH detailed below who presents for medication refills for chronic low back pain.  Please see problem based charting below for the status of her chronic medical problems.    Past Medical History  Diagnosis Date  . Diabetes mellitus, type 2 ([place])   . Hypertension   . Unspecified asthma(493.90)   . Anxiety state, unspecified   . Hypercholesteremia   . Hypothyroidism   . Obesity   . GERD (gastroesophageal reflux disease)   . Iron deficiency anemia   . DDD (degenerative disc disease), lumbar   . Spinal stenosis   . Low back pain   . Degeneration of cervical intervertebral disc   . Left knee pain     Following fall 11/15/2009.  Marland Kitchen Acne rosacea   . Heme positive stool     EGD on 04/08/2010 by Dr. Watt Climes showed chronic gastritis and a few gastric polyps; pathology showed a fundic gland polyp.  Colonoscopy on 04/08/2010 showed small external and internal hemorrhoids, and a few benign-appearing diminutive polyps in the rectum, the distal sigmoid colon, and in the distal descending colon; pathology showed hyperplastic polyps.   . Hematochezia   . Irregular menses   . Vagina bleeding   . Atrophic vaginitis   . Viral warts, unspecified     Right hand.  . Chronic interstitial cystitis     Followed by Dr. Franchot Gallo  . Elevated transaminase level     Mild elevation, AST=48, ALT=51 on 02/01/07.  . Allergic rhinitis   . Carpal tunnel syndrome   . Atypical chest pain 12/11/2008    Normal stress nuclear study 01/12/2009 by Dr. Peter Martinique  . Bilateral leg edema   . COPD (chronic obstructive pulmonary disease) ([place])   . Dysrhythmia   . Depression   . Shortness of breath   . Pneumonia   . MNOTRRNH(657.9)    Current Outpatient Prescriptions  Medication Sig Dispense Refill  . albuterol (PROAIR HFA) 108 (90  BASE) MCG/ACT inhaler Inhale 2 puffs into the lungs every 6 (six) hours as needed for wheezing or shortness of breath. 8.5 each 11  . ALPRAZolam (XANAX) 1 MG tablet Take 1 mg by mouth 4 (four) times daily as needed for anxiety.    Marland Kitchen amitriptyline (ELAVIL) 25 MG tablet Take 1 tablet (25 mg total) by mouth at bedtime. (Patient taking differently: Take 50 mg by mouth at bedtime. ) 30 tablet 3  . budesonide-formoterol (SYMBICORT) 160-4.5 MCG/ACT inhaler Inhale 2 puffs into the lungs 2 (two) times daily.    . enalapril (VASOTEC) 2.5 MG tablet Take 1 tablet (2.5 mg total) by mouth daily. 30 tablet 5  . erythromycin ethylsuccinate (EES) 400 MG tablet Take 400 mg by mouth 2 (two) times daily. Maintenance for Rosacea    . esomeprazole (NEXIUM) 40 MG capsule Take 1 capsule (40 mg total) by mouth daily. 30 capsule 5  . estradiol (ESTRACE) 0.1 MG/GM vaginal cream Apply 1 gram per vagina every night for 2 weeks, then apply three times a week 30 g 3  . FLUoxetine (PROZAC) 20 MG capsule Take 20 mg by mouth daily.     . fluticasone (FLONASE) 50 MCG/ACT nasal spray Place 2 sprays into both nostrils daily.    Marland Kitchen HYDROmorphone (DILAUDID) 2 MG tablet Take 1 tablet (2 mg total)  by mouth every 6 (six) hours as needed for severe pain. 100 tablet 0  . ipratropium (ATROVENT HFA) 17 MCG/ACT inhaler Inhale 2 puffs into the lungs 4 (four) times daily as needed for wheezing. 12.9 g 6  . KRISTALOSE 20 G packet Take 20 g by mouth 2 (two) times daily as needed (constipation).     . metFORMIN (GLUCOPHAGE-XR) 500 MG 24 hr tablet Take 1 tablet (500 mg total) by mouth 2 (two) times daily. 60 tablet 5  . potassium chloride (KLOR-CON M10) 10 MEQ tablet Take 2 tablets (20 mEq total) by mouth daily. 60 tablet 5  . pravastatin (PRAVACHOL) 10 MG tablet Take 1 tablet (10 mg total) by mouth every evening. 90 tablet 3  . SYNTHROID 88 MCG tablet TAKE 1 TABLET (88 MCG TOTAL) BY MOUTH DAILY BEFORE BREAKFAST. 30 tablet 5  . zolpidem (AMBIEN) 10 MG  tablet Take 5 mg by mouth at bedtime as needed for sleep.     Marland Kitchen acetaminophen (TYLENOL) 500 MG tablet Take 1,000 mg by mouth every 8 (eight) hours as needed for mild pain or headache. Reported on 02/07/2016    . Blood Glucose Monitoring Suppl (ACCU-CHEK COMPACT CARE KIT) KIT Use to test blood glucose one time daily. Dx:250.00 (Patient not taking: Reported on 01/10/2016) 1 each 0  . carisoprodol (SOMA) 350 MG tablet TAKE 1 TABLET BY MOUTH 3 TIMES A DAY AS NEEDED FOR MUSCLE SPASM (Patient not taking: Reported on 01/10/2016) 105 tablet 0  . glucose blood (ACCU-CHEK COMPACT TEST DRUM) test strip Use to check blood sugar 1 time daily. diag code E11.9. Non insulin dependent (Patient not taking: Reported on 01/10/2016) 35 each 6  . Naloxone HCl 4 MG/0.1ML LIQD Place 1 application into the nose once. (Patient not taking: Reported on 01/10/2016) 1 each 0  . nystatin (MYCOSTATIN/NYSTOP) 100000 UNIT/GM POWD APPLY TOPICALLY TWO TIMES A DAY TO AFFECTED AREAS OF SKIN FOR 10 DAYS. 60 g 1   No current facility-administered medications for this visit.   Family History  Problem Relation Age of Onset  . Heart attack Father 22  . Breast cancer Neg Hx   . Colon cancer Neg Hx   . Diabetes Mother   . Hypertension Mother   . Diabetes Sister    Social History   Social History  . Marital Status: Single    Spouse Name: N/A  . Number of Children: N/A  . Years of Education: N/A   Social History Main Topics  . Smoking status: Former Smoker -- 1.00 packs/day for 15 years    Types: Cigarettes    Quit date: 01/18/1996  . Smokeless tobacco: Never Used  . Alcohol Use: No  . Drug Use: No  . Sexual Activity: Not Currently   Other Topics Concern  . None   Social History Narrative   Review of Systems: Review of Systems  Constitutional: Negative for fever and weight loss.  Eyes: Negative for blurred vision.  Respiratory: Negative for cough and shortness of breath.   Cardiovascular: Negative for chest pain.   Gastrointestinal: Negative for heartburn.  Musculoskeletal: Positive for back pain and joint pain. Negative for falls and neck pain.  Neurological: Negative for dizziness and headaches.  Psychiatric/Behavioral: Negative for substance abuse.     Objective:  Physical Exam: Filed Vitals:   02/07/16 1105  BP: 140/86  Pulse: 80  Temp: 98.1 F (36.7 C)  TempSrc: Oral  Resp: 18  Height: _0  (1.549 m)  Weight: 226 lb 1.6 oz (102.558  kg)  SpO2: 98%  Physical Exam  Constitutional: She is well-developed, well-nourished, and in no distress.  HENT:  Head: Normocephalic and atraumatic.  Mouth/Throat: Oropharynx is clear and moist.  Cardiovascular: Normal rate and regular rhythm.   Pulmonary/Chest: Effort normal and breath sounds normal.  Abdominal: Soft. Bowel sounds are normal.  Musculoskeletal:       Left shoulder: She exhibits decreased range of motion. She exhibits no tenderness, no swelling and normal strength.       Lumbar back: She exhibits tenderness (paraspinal). She exhibits no bony tenderness.  Her left shoulder has decreased ROM in all directions, she has a painful arc, drop arm test is negative, empty can test negative, neers impingment test positive.  There is no warmth or redness of the shoulder. Negative SLR bilaterally  Psychiatric: Affect normal.  Nursing note and vitals reviewed.   Assessment & Plan:  Case discussed with Dr. Lynnae January  Essential hypertension HPI: Taking enalapril, no complaints  A: Essential HTN, at goal  P: Continue enalapril 2.50m dialy  Diabetes mellitus type 2, uncontrolled ([facility]) HPI: Taking metformin, no complaints, has had recent weight gain.  No polyuria or polydipsia.  A: Type 2 DM with peripheral neuropathy, fair control  P: Continue Metformin 5013mBID, I am concerned with her medication list that adding additional agents will not be benefitial for her and would like to reduce her polypharmacy.  She is agreeable for more intensive  MNT to try to lose weight and have better control of her diabetes. -Referral to nutrition.  Bursitis of left shoulder HPI: She has had left shoulder pain with movement for about 1-2 months, on exam I she has some limited ROM and a painful arc however her rotation cuff is grossly intact.  She does have a history of DDD of her C-spine but denines any radicular symptoms.  A: Suspect Bursitis versus impingment of left shoulder  P: Diagnostic and therapeutic lidocaine/kenalog injection of the shoulder.  OBESITY A: Obesity  P: Referral for MNT Tried to refine goals of chronic opioid, patient wants to increase activity and have a more active lifestyle to help lose weight.  Polypharmacy HPI: she is on a number of medications to treat a variety of symptoms.   A: Polypharmacy  P: I am concerned for possible medication interactions as well as I feel that she would benefit from additional instruction on how to take her medications (ie synthroid, nexium) and she is agreeable to meet with our clinical pharmacist for a medication review.  I would also like to get her back in with her PCP to discuss some of the other medications she is on to see which mediations could be further reduced or removed.  Low back pain HPI: She has a long history of low back pain and spinal stenosis. She has been on Hydromorphone for this pain and her main reason for her visit today is medication refill.  Initially she reported to me that she takes Hydromorphone 78m31mvery 6 hours and takes 4 pills a day.  She reports that this reduces her low back pain.  She had never previously thought about the goal of what we are treating with her low back pain but feels that it needs to be treated "so I can have a normal life, pain free."  I discussed that pain free may not be an option and she reported that a goal of hers would be to have the pain reduced enough so that she can walk without severe  pain to loose weight. She last took  dilaudid this morning.  A: Chronic low back pain with long term use of opioid analgesic.  P: Review of [place] Controlled medication database shows that last fill was 12/24/15, it appears she has not needed to fill the medication over the last 6 months at 1 month intervals,  She appropriately tells me that is she does not need the medication she does not take it, I discussed that reducing the amount of pills is advised and we agreed to reduce the number of pills to 100 per month from 120.  I have provided her with a 3 month prescription. She is prescribed Naloxone and reports she has it at home. I obtained a UDS today    Medications Ordered Meds ordered this encounter  Medications  . nystatin (MYCOSTATIN/NYSTOP) 100000 UNIT/GM POWD    Sig: APPLY TOPICALLY TWO TIMES A DAY TO AFFECTED AREAS OF SKIN FOR 10 DAYS.    Dispense:  60 g    Refill:  1  . DISCONTD: HYDROmorphone (DILAUDID) 2 MG tablet    Sig: Take 1 tablet (2 mg total) by mouth every 6 (six) hours as needed for severe pain. Rx 1/3 Please fill 30 days after last Rx.    Dispense:  100 tablet    Refill:  0  . DISCONTD: HYDROmorphone (DILAUDID) 2 MG tablet    Sig: Take 1 tablet (2 mg total) by mouth every 6 (six) hours as needed for severe pain.    Dispense:  100 tablet    Refill:  0    Rx 1/3 Please fill 30 days after last Rx.  Marland Kitchen DISCONTD: HYDROmorphone (DILAUDID) 2 MG tablet    Sig: Take 1 tablet (2 mg total) by mouth every 6 (six) hours as needed for severe pain.    Dispense:  100 tablet    Refill:  0    Rx 2/3 Please fill 30 days after last Rx.  Marland Kitchen HYDROmorphone (DILAUDID) 2 MG tablet    Sig: Take 1 tablet (2 mg total) by mouth every 6 (six) hours as needed for severe pain.    Dispense:  100 tablet    Refill:  0    Rx 3/3 Please fill 30 days after last Rx.   Other Orders Orders Placed This Encounter  Procedures  . Glucose, capillary  . ToxAssure Select,+Antidepr,UR  . Amb ref to Medical Nutrition Therapy-MNT    Referral  Priority:  Routine    Referral Type:  Consultation    Referral Reason:  Specialty Services Required    Requested Specialty:  Nutrition    Number of Visits Requested:  1  . Amb Referral to Clinical Pharmacist    Referral Priority:  Routine    Referral Type:  Consultation    Referred to Provider:  Forde Dandy, [facility]    Number of Visits Requested:  1  . POC Hbg A1C   Follow Up: No Follow-up on file.

## 2016-02-08 DIAGNOSIS — Z79891 Long term (current) use of opiate analgesic: Secondary | ICD-10-CM | POA: Insufficient documentation

## 2016-02-08 DIAGNOSIS — M545 Low back pain, unspecified: Secondary | ICD-10-CM | POA: Insufficient documentation

## 2016-02-08 NOTE — Assessment & Plan Note (Signed)
HPI: Taking enalapril, no complaints  A: Essential HTN, at goal  P: Continue enalapril 2.5mg  dialy

## 2016-02-08 NOTE — Assessment & Plan Note (Signed)
A: Obesity  P: Referral for MNT Tried to refine goals of chronic opioid, patient wants to increase activity and have a more active lifestyle to help lose weight.

## 2016-02-08 NOTE — Assessment & Plan Note (Signed)
HPI: She has a long history of low back pain and spinal stenosis. She has been on Hydromorphone for this pain and her main reason for her visit today is medication refill.  Initially she reported to me that she takes Hydromorphone 2mg  every 6 hours and takes 4 pills a day.  She reports that this reduces her low back pain.  She had never previously thought about the goal of what we are treating with her low back pain but feels that it needs to be treated "so I can have a normal life, pain free."  I discussed that pain free may not be an option and she reported that a goal of hers would be to have the pain reduced enough so that she can walk without severe pain to loose weight. She last took dilaudid this morning.  A: Chronic low back pain with long term use of opioid analgesic.  P: Review of [place] Controlled medication database shows that last fill was 12/24/15, it appears she has not needed to fill the medication over the last 6 months at 1 month intervals,  She appropriately tells me that is she does not need the medication she does not take it, I discussed that reducing the amount of pills is advised and we agreed to reduce the number of pills to 100 per month from 120.  I have provided her with a 3 month prescription. She is prescribed Naloxone and reports she has it at home. I obtained a UDS today

## 2016-02-08 NOTE — Progress Notes (Signed)
PROCEDURE NOTE  PROCEDURE: left shoulder joint steroid injection.  PREOPERATIVE DIAGNOSIS: Bursitis of the left shoulder.  POSTOPERATIVE DIAGNOSIS: Bursitis of the left shoulder.  PROCEDURE: The patient was apprised of the risks and the benefits of the procedure and informed consent was obtained, as witnessed by Leigh. Time-out procedure was performed, with confirmation of the patient's name, date of birth, and correct identification of the left shoulder to be injected. The patient's shoulder was then marked at the appropriate site for injection placement. The shoulder was sterilely prepped with Betadine. A 40 mg (1 milliliter) solution of Kenalog was drawn up into a 3 mL syringe with a 1 mL of 1% lidocaine. The patient was injected with a 27-gauge needle at the posterior aspect of her left shoulder. There were no complications. The patient tolerated the procedure well. There was minimal bleeding. The patient was instructed to ice her shoulder upon leaving clinic and refrain from overuse over the next 3 days. The patient was instructed to go to the emergency room with any usual pain, swelling, or redness occurred in the injected area. The patient was given a followup appointment to evaluate response to the injection to his increased range of motion and reduction of pain.  The procedure was supervised by attending physician, Dr. Rogelia BogaButcher.

## 2016-02-08 NOTE — Assessment & Plan Note (Signed)
HPI: Taking metformin, no complaints, has had recent weight gain.  No polyuria or polydipsia.  A: Type 2 DM with peripheral neuropathy, fair control  P: Continue Metformin 500mg  BID, I am concerned with her medication list that adding additional agents will not be benefitial for her and would like to reduce her polypharmacy.  She is agreeable for more intensive MNT to try to lose weight and have better control of her diabetes. -Referral to nutrition.

## 2016-02-08 NOTE — Assessment & Plan Note (Signed)
HPI: she is on a number of medications to treat a variety of symptoms.   A: Polypharmacy  P: I am concerned for possible medication interactions as well as I feel that she would benefit from additional instruction on how to take her medications (ie synthroid, nexium) and she is agreeable to meet with our clinical pharmacist for a medication review.  I would also like to get her back in with her PCP to discuss some of the other medications she is on to see which mediations could be further reduced or removed.

## 2016-02-08 NOTE — Assessment & Plan Note (Signed)
HPI: She has had left shoulder pain with movement for about 1-2 months, on exam I she has some limited ROM and a painful arc however her rotation cuff is grossly intact.  She does have a history of DDD of her C-spine but denines any radicular symptoms.  A: Suspect Bursitis versus impingment of left shoulder  P: Diagnostic and therapeutic lidocaine/kenalog injection of the shoulder.

## 2016-02-10 NOTE — Progress Notes (Signed)
Internal Medicine Clinic Attending  Case discussed with Dr. Hoffman soon after the resident saw the patient.  We reviewed the resident's history and exam and pertinent patient test results.  I agree with the assessment, diagnosis, and plan of care documented in the resident's note. 

## 2016-02-12 LAB — TOXASSURE SELECT,+ANTIDEPR,UR: PDF: 0

## 2016-02-28 ENCOUNTER — Ambulatory Visit: Payer: Commercial Managed Care - HMO | Admitting: Dietician

## 2016-02-28 ENCOUNTER — Ambulatory Visit: Payer: Commercial Managed Care - HMO | Admitting: Pharmacist

## 2016-03-07 ENCOUNTER — Other Ambulatory Visit: Payer: Self-pay | Admitting: Student in an Organized Health Care Education/Training Program

## 2016-03-07 DIAGNOSIS — F331 Major depressive disorder, recurrent, moderate: Secondary | ICD-10-CM | POA: Diagnosis not present

## 2016-03-07 DIAGNOSIS — F332 Major depressive disorder, recurrent severe without psychotic features: Secondary | ICD-10-CM | POA: Diagnosis not present

## 2016-03-16 NOTE — Addendum Note (Signed)
Addended by: Neomia DearPOWERS, Markayla Reichart E on: 03/16/2016 07:14 PM   Modules accepted: Orders

## 2016-03-17 ENCOUNTER — Telehealth: Payer: Self-pay | Admitting: Student in an Organized Health Care Education/Training Program

## 2016-03-17 DIAGNOSIS — E119 Type 2 diabetes mellitus without complications: Secondary | ICD-10-CM | POA: Diagnosis not present

## 2016-03-17 NOTE — Telephone Encounter (Signed)
Patient called requesting a ref to [facility] and to an ENT.  Please advise

## 2016-04-03 NOTE — Addendum Note (Signed)
Addended by: Neomia DearPOWERS, Lyndell Allaire E on: 04/03/2016 01:52 PM   Modules accepted: Orders

## 2016-04-10 ENCOUNTER — Other Ambulatory Visit: Payer: Self-pay | Admitting: Student in an Organized Health Care Education/Training Program

## 2016-04-10 ENCOUNTER — Telehealth: Payer: Self-pay | Admitting: Student in an Organized Health Care Education/Training Program

## 2016-04-10 DIAGNOSIS — E119 Type 2 diabetes mellitus without complications: Secondary | ICD-10-CM

## 2016-04-10 DIAGNOSIS — R32 Unspecified urinary incontinence: Secondary | ICD-10-CM

## 2016-04-10 NOTE — Telephone Encounter (Signed)
I placed referral for urology and ophtho because those are recurring. We will wait for her visit with Dr. Ala DachFord to see if the dermatology or ENT referrals are appropriate.

## 2016-04-10 NOTE — Telephone Encounter (Signed)
Patient requesting 4 referrals for the following offices which all require [employer] Authorizations before appointments can be made.  A Yearly Diabetic Eye Exam for Dr. Laruth BouchardGroat's office, a Yearly Urologist appointment for Urinary Incontinence with Dr. Retta Dionesahlstedt, a Dermatologist appt for a  Spot on her shoulders that's growing with Dr. Terri PiedraLupton  ( She has already made her own Appt sch on 04/18/2016),  and a ENT Ref with Dr Jearld FentonByers for her Throat.  Patient has already sch an appointment to come in to the clinc to be seen  on 04/14/2016 @ 1:45pm with Dr. Ala DachFord.

## 2016-04-11 NOTE — Telephone Encounter (Signed)
Thank you for all of your help with the referrals.

## 2016-04-14 ENCOUNTER — Encounter: Payer: Self-pay | Admitting: Internal Medicine

## 2016-04-14 ENCOUNTER — Telehealth: Payer: Self-pay | Admitting: Dietician

## 2016-04-14 ENCOUNTER — Ambulatory Visit (INDEPENDENT_AMBULATORY_CARE_PROVIDER_SITE_OTHER): Payer: Commercial Managed Care - HMO | Admitting: Internal Medicine

## 2016-04-14 VITALS — BP 130/74 | HR 103 | Temp 98.2°F | Ht 61.0 in | Wt 225.3 lb

## 2016-04-14 DIAGNOSIS — Z79891 Long term (current) use of opiate analgesic: Secondary | ICD-10-CM

## 2016-04-14 DIAGNOSIS — I10 Essential (primary) hypertension: Secondary | ICD-10-CM

## 2016-04-14 DIAGNOSIS — M545 Low back pain: Secondary | ICD-10-CM

## 2016-04-14 DIAGNOSIS — L720 Epidermal cyst: Secondary | ICD-10-CM | POA: Insufficient documentation

## 2016-04-14 DIAGNOSIS — B372 Candidiasis of skin and nail: Secondary | ICD-10-CM

## 2016-04-14 DIAGNOSIS — R61 Generalized hyperhidrosis: Secondary | ICD-10-CM | POA: Diagnosis not present

## 2016-04-14 DIAGNOSIS — Z7984 Long term (current) use of oral hypoglycemic drugs: Secondary | ICD-10-CM

## 2016-04-14 DIAGNOSIS — L989 Disorder of the skin and subcutaneous tissue, unspecified: Secondary | ICD-10-CM | POA: Diagnosis not present

## 2016-04-14 DIAGNOSIS — E1165 Type 2 diabetes mellitus with hyperglycemia: Secondary | ICD-10-CM | POA: Diagnosis not present

## 2016-04-14 DIAGNOSIS — E1140 Type 2 diabetes mellitus with diabetic neuropathy, unspecified: Secondary | ICD-10-CM

## 2016-04-14 DIAGNOSIS — Z872 Personal history of diseases of the skin and subcutaneous tissue: Secondary | ICD-10-CM | POA: Diagnosis not present

## 2016-04-14 DIAGNOSIS — G8929 Other chronic pain: Secondary | ICD-10-CM

## 2016-04-14 DIAGNOSIS — M5440 Lumbago with sciatica, unspecified side: Secondary | ICD-10-CM

## 2016-04-14 DIAGNOSIS — Z79899 Other long term (current) drug therapy: Secondary | ICD-10-CM

## 2016-04-14 DIAGNOSIS — Z0000 Encounter for general adult medical examination without abnormal findings: Secondary | ICD-10-CM

## 2016-04-14 DIAGNOSIS — M7552 Bursitis of left shoulder: Secondary | ICD-10-CM

## 2016-04-14 LAB — GLUCOSE, CAPILLARY: Glucose-Capillary: 214 mg/dL — ABNORMAL HIGH (ref 65–99)

## 2016-04-14 MED ORDER — EMPAGLIFLOZIN 10 MG PO TABS: 10.0000 mg | ORAL_TABLET | Freq: Every day | ORAL | Status: DC

## 2016-04-14 MED ORDER — ENALAPRIL MALEATE 2.5 MG PO TABS: 2.5000 mg | ORAL_TABLET | Freq: Every day | ORAL | Status: DC

## 2016-04-14 MED ORDER — POTASSIUM CHLORIDE CRYS ER 10 MEQ PO TBCR: 20.0000 meq | EXTENDED_RELEASE_TABLET | Freq: Every day | ORAL | Status: DC

## 2016-04-14 MED ORDER — DICLOFENAC SODIUM 1 % TD GEL: 4.0000 g | Freq: Four times a day (QID) | TRANSDERMAL | Status: DC

## 2016-04-14 MED ORDER — HYDROMORPHONE HCL 2 MG PO TABS: 2.0000 mg | ORAL_TABLET | Freq: Four times a day (QID) | ORAL | Status: DC | PRN

## 2016-04-14 MED ORDER — NYSTATIN 100000 UNIT/GM EX POWD: CUTANEOUS | Status: DC

## 2016-04-14 NOTE — Patient Instructions (Addendum)
Mary Gray,  It was so great to see you again.  For your left shoulder and lower back pain, we are starting voltaren gel. Be sure to rub it in real good. For your pain in general, we are prescribing 44 tablet of dilaudid to last you until June 5 with Dr. Oswaldo DoneVincent. He will discuss pain control with you at that time.  For your diabetes, we are starting new medication called Jardiance, which you will take once per day. One of the side effects is possible burning with urination, which could be due to a urinary tract infection. If this occurs, please come by the clinic.  Please follow up with your dermatologist Dr. Terri PiedraLupton about your skin lesions.  Have a lovely day.  Empagliflozin oral tablets What is this medicine? EMPAGLIGLOZIN (EM pa gli FLOE zin) helps to treat type 2 diabetes. It helps to control blood sugar. Treatment is combined with diet and exercise. This medicine may be used for other purposes; ask your health care provider or pharmacist if you have questions. What should I tell my health care provider before I take this medicine? They need to know if you have any of these conditions: -dehydration -diabetic ketoacidosis -diet low in salt -eating less due to illness, surgery, dieting, or any other reason -having surgery -high cholesterol -high levels of potassium in the blood -history of pancreatitis or pancreas problems -history of yeast infection of the penis or vagina -if you often drink alcohol -infections in the bladder, kidneys, or urinary tract -kidney disease -liver disease -low blood pressure -on hemodialysis -problems urinating -type 1 diabetes -uncircumcised female -an unusual or allergic reaction to empagliflozin, other medicines, foods, dyes, or preservatives -pregnant or trying to get pregnant -breast-feeding How should I use this medicine? Take this medicine by mouth with a glass of water. Follow the directions on the prescription label. Take it in the morning,  with or without food. Take your dose at the same time each day. Do not take more often than directed. Do not stop taking except on your doctor's advice. Talk to your pediatrician regarding the use of this medicine in children. Special care may be needed. Overdosage: If you think you have taken too much of this medicine contact a poison control center or emergency room at once. NOTE: This medicine is only for you. Do not share this medicine with others. What if I miss a dose? If you miss a dose, take it as soon as you can. If it is almost time for your next dose, take only that dose. Do not take double or extra doses. What may interact with this medicine? Do not take this medicine with any of the following medications: -gatifloxacin This medicine may also interact with the following medications: -alcohol -certain medicines for blood pressure, heart disease -diuretics This list may not describe all possible interactions. Give your health care provider a list of all the medicines, herbs, non-prescription drugs, or dietary supplements you use. Also tell them if you smoke, drink alcohol, or use illegal drugs. Some items may interact with your medicine. What should I watch for while using this medicine? Visit your doctor or health care professional for regular checks on your progress. This medicine can cause a serious condition in which there is too much acid in the blood. If you develop nausea, vomiting, stomach pain, unusual tiredness, or breathing problems, stop taking this medicine and call your doctor right away. If possible, use a ketone dipstick to check for ketones in your  urine. A test called the HbA1C (A1C) will be monitored. This is a simple blood test. It measures your blood sugar control over the last 2 to 3 months. You will receive this test every 3 to 6 months. Learn how to check your blood sugar. Learn the symptoms of low and high blood sugar and how to manage them. Always carry a  quick-source of sugar with you in case you have symptoms of low blood sugar. Examples include hard sugar candy or glucose tablets. Make sure others know that you can choke if you eat or drink when you develop serious symptoms of low blood sugar, such as seizures or unconsciousness. They must get medical help at once. Tell your doctor or health care professional if you have high blood sugar. You might need to change the dose of your medicine. If you are sick or exercising more than usual, you might need to change the dose of your medicine. Do not skip meals. Ask your doctor or health care professional if you should avoid alcohol. Many nonprescription cough and cold products contain sugar or alcohol. These can affect blood sugar. Wear a medical ID bracelet or chain, and carry a card that describes your disease and details of your medicine and dosage times. What side effects may I notice from receiving this medicine? Side effects that you should report to your doctor or health care professional as soon as possible: -allergic reactions like skin rash, itching or hives, swelling of the face, lips, or tongue -breathing problems -dizziness -fast or irregular heartbeat -feeling faint or lightheaded, falls -muscle weakness -nausea, vomiting, unusual stomach upset or pain -signs and symptoms of low blood sugar such as feeling anxious, confusion, dizziness, increased hunger, unusually weak or tired, sweating, shakiness, cold, irritable, headache, blurred vision, fast heartbeat, loss of consciousness -signs and symptoms of a urinary tract infection, such as fever, chills, a burning feeling when urinating, blood in the urine, back pain -trouble passing urine or change in the amount of urine, including an urgent need to urinate more often, in larger amounts, or at night -penile discharge, itching, or pain in men -unusual tiredness -vaginal discharge, itching, or odor in women Side effects that usually do not  require medical attention (Report these to your doctor or health care professional if they continue or are bothersome.): -joint pain -mild increase in urination -thirsty This list may not describe all possible side effects. Call your doctor for medical advice about side effects. You may report side effects to FDA at 1-800-FDA-1088. Where should I keep my medicine? Keep out of the reach of children. Store at room temperature between 20 and 25 degrees C (68 and 77 degrees F). Throw away any unused medicine after the expiration date. NOTE: This sheet is a summary. It may not cover all possible information. If you have questions about this medicine, talk to your doctor, pharmacist, or health care provider.    2016, [employer]vier/[employer] Standard. (2014-10-27 11:37:10)

## 2016-04-14 NOTE — Progress Notes (Signed)
   Subjective:    Patient ID: Mary Gray, female    DOB: 18-Sep-1961, 55 y.o.   MRN: [id number]  HPI  Mary Gray is a 55 year old woman with a PMH of degenerative disc disease, chronic pain on chronic opioids, T2DM, HTN, and obesity who comes to the to the clinic to discuss skin lesions and several other chronic medical problems as listed below.  Low back pain: Mary Gray has longstanding lower back pain treated with hydromorphone 2 mg, prescribed as q6h prn. However, she is taking between four and six tablets a day and recently ran out. The patient indicates she was previously on oxycodone, which she says "worked better." She says the pain sometimes radiates down her leg and sometimes doesn't. She says she wants something in addition to what she normally takes "even if it is just for a month so I can get relief from this pain." She follows with an orthopedic surgeon who is "still trying to decide what he's going to do with my back."  T2DM: Patient only takes Metformin 500 mg BID. She checks her blood sugar regularly. She brings her glucometer, which is persistently hyperglycemic in the 150-200 range without any hypoglycemia.  HTN: Patient has been taking enalapril 2.5 mg daily. She is requesting a refill.  Sweating in skin folds: Patient says she ran out of nystatin powder, she she says help ward off yeast infections in her skin folds, as she sweats profusely in places where there is skin-on-skin contact.  Left Shoulder Pain: Patient has left shoulder pain for several months with limited range of motion that is worse in the morning.   Left shoulder and Facial Lesions: Patient describes a "bump" on her left should that has been there for at least a year and is continuing to grow slowly. She also describes a couple small lesions around her hairline. She is requesting a dermatology referral.  Healthcare Maintenance: Patient is requesting a referral to an OB/Gyn. She has no gynecological complaints  today.  Review of Systems  Respiratory: Negative for cough and shortness of breath.   Cardiovascular: Negative for chest pain and leg swelling.  Musculoskeletal: Positive for back pain and arthralgias.  Neurological: Negative for weakness and light-headedness.       Objective:   Physical Exam  Constitutional: No distress.  Cardiovascular: Normal rate, regular rhythm and normal heart sounds.   Pulmonary/Chest: Effort normal and breath sounds normal. No respiratory distress. She has no wheezes.  Abdominal: Soft. Bowel sounds are normal.  Obese.  Musculoskeletal:  Limited range of motion in left shoulder. Paraspinal TTP in lumbar back.  Skin: She is not diaphoretic.  Mobile, firm subcutaneous nodule on left shoulder. Two scaly 1 cm lesions near to hairline.  Vitals reviewed.         Assessment & Plan:   Please see problem based assessment and plan for details.

## 2016-04-14 NOTE — Telephone Encounter (Signed)
Called patient's pharmacyt as part of project trying to help patient's with a1C between 8-9% lower their blood sugars to target

## 2016-04-14 NOTE — Telephone Encounter (Signed)
6 month refill history received from [facility] on [address]. Copy given to pharmacist and PCP.

## 2016-04-15 NOTE — Assessment & Plan Note (Signed)
A: No current candidal infection, but she is requesting Nystatin powder given sweating at her skin folds.  P: Refill nystatin powder

## 2016-04-15 NOTE — Assessment & Plan Note (Signed)
A: Rough ~1 cm lesions at hairline, concerning for possible basal cell carcinoma.  P: Referral to dermatology

## 2016-04-15 NOTE — Assessment & Plan Note (Signed)
A: Patient received left shoulder injection two months ago, which she found to be helpful. She would be interested in receiving injections in the future. DDx also includes shoulder tendinopathy.  P:  Voltaren gel

## 2016-04-15 NOTE — Assessment & Plan Note (Signed)
A: Patient requesting OB/gyn referral. No new symptoms  P: Referral to Ob/gyn

## 2016-04-15 NOTE — Assessment & Plan Note (Signed)
A: Appearance of inclusion cyst.  P: Referral to dermatology

## 2016-04-15 NOTE — Assessment & Plan Note (Signed)
A: BP at goal  P: Refill enalapril 2.5 mg daily

## 2016-04-15 NOTE — Assessment & Plan Note (Signed)
A: Driven by longstanding degenerative disc disease. Patient was counseled extensively on why it would not be justified to increase her pain regimen at this time. In layperson's terms, I explained the concept of of opioid tolerance and how she would likely get used to any benefit seen by increasing her dilaudid or adding other opioids to her regimen. She said she understood, but did not care, because she needed any relief from her pain that she could get. I explained that with dietary changes weight loss that we may achieve with empagliflozin, that strain on her back may improve.  She received three prescriptions of one-hundred 2 mg dilaudid pills to last three months on 3/21 and she has recently run out of it (~ 2 months). She is requesting a new prescription  P: Will prescribe 44 2 mg pills of hydromorphone q6h prn. This should last until June 5, her next appointment with her PCP, during which there will be a pain management discussion.

## 2016-04-15 NOTE — Assessment & Plan Note (Signed)
A: BGs are persistently elevated on metformin based on her glucometer. Last A1c was 8.3.  Given her obesity, she may benefit from an SGLT-2 inhibitor. Renal function preserved with normal Cr in Sept 2016. She was counseled that daily BG checks are unnecessary, has she is not on any agents known to cause hypoglycemia and she is not on insulin. She was counseled on the risk of UTIs with SGLT-2 inhibitors.  P: Add empagliflozin 10 mg daily Metformin 500 mg BID

## 2016-04-18 ENCOUNTER — Other Ambulatory Visit: Payer: Self-pay | Admitting: Student in an Organized Health Care Education/Training Program

## 2016-04-18 DIAGNOSIS — L720 Epidermal cyst: Secondary | ICD-10-CM | POA: Diagnosis not present

## 2016-04-18 DIAGNOSIS — L820 Inflamed seborrheic keratosis: Secondary | ICD-10-CM | POA: Diagnosis not present

## 2016-04-18 NOTE — Progress Notes (Signed)
Internal Medicine Clinic Attending  I saw and evaluated the patient.  I personally confirmed the key portions of the history and exam documented by Dr. Ford and I reviewed pertinent patient test results.  The assessment, diagnosis, and plan were formulated together and I agree with the documentation in the resident's note. 

## 2016-04-21 ENCOUNTER — Telehealth: Payer: Self-pay | Admitting: Student in an Organized Health Care Education/Training Program

## 2016-04-21 NOTE — Telephone Encounter (Signed)
APT. REMINDER CALL, NO ANSWER °

## 2016-04-24 ENCOUNTER — Encounter: Payer: Self-pay | Admitting: Student in an Organized Health Care Education/Training Program

## 2016-04-24 ENCOUNTER — Encounter: Payer: Commercial Managed Care - HMO | Admitting: Student in an Organized Health Care Education/Training Program

## 2016-04-24 ENCOUNTER — Telehealth: Payer: Self-pay | Admitting: Student in an Organized Health Care Education/Training Program

## 2016-04-24 NOTE — Telephone Encounter (Signed)
[name] did not show for our clinic appointment today to discuss her pain and anxiety medications. I am worried about prescribing further controlled substances to her until she comes for a continuity clinic visit with me so we can obtain a U tox and go over the controlled substance contract carefully.

## 2016-05-18 DIAGNOSIS — L720 Epidermal cyst: Secondary | ICD-10-CM | POA: Diagnosis not present

## 2016-05-30 DIAGNOSIS — F331 Major depressive disorder, recurrent, moderate: Secondary | ICD-10-CM | POA: Diagnosis not present

## 2016-06-14 ENCOUNTER — Encounter: Payer: Commercial Managed Care - HMO | Admitting: Family Medicine

## 2016-06-15 ENCOUNTER — Other Ambulatory Visit: Payer: Self-pay

## 2016-06-15 DIAGNOSIS — M5440 Lumbago with sciatica, unspecified side: Secondary | ICD-10-CM

## 2016-06-15 NOTE — Telephone Encounter (Signed)
Pt informed

## 2016-06-15 NOTE — Telephone Encounter (Signed)
Chronic lumbar pain with a high risk medication regimen including dilaudid, alprazolam, and zolpidem. Last seen here on 5/26 where she got #44 tabs of dilaudid. Then she missed an appointment with me on 6/5 to discuss pain meds. According to database review she filled diluadid #100 on 5/26, I do not see that she has filled the #44 tabs. I would like to insist on seeing her in a visit before giving another refill. She has an appointment with me on Monday 7/31 (in four days). I am unsure how much dilaudid she has left at home, but I would prefer to give her the next refills in person on Monday.

## 2016-06-16 DIAGNOSIS — E119 Type 2 diabetes mellitus without complications: Secondary | ICD-10-CM | POA: Diagnosis not present

## 2016-06-19 ENCOUNTER — Ambulatory Visit (INDEPENDENT_AMBULATORY_CARE_PROVIDER_SITE_OTHER): Payer: Commercial Managed Care - HMO | Admitting: Student in an Organized Health Care Education/Training Program

## 2016-06-19 ENCOUNTER — Encounter: Payer: Self-pay | Admitting: Student in an Organized Health Care Education/Training Program

## 2016-06-19 VITALS — BP 132/76 | HR 98 | Temp 98.1°F | Ht 61.0 in | Wt 221.3 lb

## 2016-06-19 DIAGNOSIS — Z79891 Long term (current) use of opiate analgesic: Secondary | ICD-10-CM

## 2016-06-19 DIAGNOSIS — L84 Corns and callosities: Secondary | ICD-10-CM

## 2016-06-19 DIAGNOSIS — E034 Atrophy of thyroid (acquired): Secondary | ICD-10-CM

## 2016-06-19 DIAGNOSIS — M4806 Spinal stenosis, lumbar region: Secondary | ICD-10-CM

## 2016-06-19 DIAGNOSIS — F17200 Nicotine dependence, unspecified, uncomplicated: Secondary | ICD-10-CM

## 2016-06-19 DIAGNOSIS — G8929 Other chronic pain: Secondary | ICD-10-CM | POA: Diagnosis not present

## 2016-06-19 DIAGNOSIS — R0609 Other forms of dyspnea: Secondary | ICD-10-CM

## 2016-06-19 DIAGNOSIS — E785 Hyperlipidemia, unspecified: Secondary | ICD-10-CM

## 2016-06-19 DIAGNOSIS — E119 Type 2 diabetes mellitus without complications: Secondary | ICD-10-CM | POA: Diagnosis not present

## 2016-06-19 DIAGNOSIS — E1140 Type 2 diabetes mellitus with diabetic neuropathy, unspecified: Secondary | ICD-10-CM

## 2016-06-19 DIAGNOSIS — K219 Gastro-esophageal reflux disease without esophagitis: Secondary | ICD-10-CM

## 2016-06-19 DIAGNOSIS — F1920 Other psychoactive substance dependence, uncomplicated: Secondary | ICD-10-CM

## 2016-06-19 DIAGNOSIS — E1165 Type 2 diabetes mellitus with hyperglycemia: Principal | ICD-10-CM

## 2016-06-19 DIAGNOSIS — Z7984 Long term (current) use of oral hypoglycemic drugs: Secondary | ICD-10-CM

## 2016-06-19 DIAGNOSIS — E038 Other specified hypothyroidism: Secondary | ICD-10-CM | POA: Diagnosis not present

## 2016-06-19 DIAGNOSIS — E039 Hypothyroidism, unspecified: Secondary | ICD-10-CM

## 2016-06-19 DIAGNOSIS — M48061 Spinal stenosis, lumbar region without neurogenic claudication: Secondary | ICD-10-CM

## 2016-06-19 DIAGNOSIS — R0600 Dyspnea, unspecified: Secondary | ICD-10-CM

## 2016-06-19 LAB — GLUCOSE, CAPILLARY: Glucose-Capillary: 171 mg/dL — ABNORMAL HIGH (ref 65–99)

## 2016-06-19 LAB — POCT GLYCOSYLATED HEMOGLOBIN (HGB A1C): Hemoglobin A1C: 7.4

## 2016-06-19 MED ORDER — HYDROCODONE-ACETAMINOPHEN 7.5-325 MG PO TABS: 1.0000 | ORAL_TABLET | Freq: Three times a day (TID) | ORAL | 0 refills | Status: DC | PRN

## 2016-06-19 NOTE — Assessment & Plan Note (Signed)
Chronic pain generators are degenerative lumbar spinal stenosis with degenerative disc disease and facet disease. She has been using opiates since at least 2013 through the [facility] to control her pain. Historically used Dilaudid 2 mg 3-4 tablets daily. Reported to me interested in transitioning to hydrocodone-in the past she thought that may be more effective. She is at least moderate risk for continuing narcotic medication because she is also using alprazolam and zolpidem for anxiety disorder. I reviewed the controlled substance database today which showed appropriate dispensation. Urine tox screen was collected today, last dilaudid dose was reportedly 7/30. I used the opioid equivalency chart to convert dilaudid dosing to hydrocodone, and came up with Hydrocodone 7.5mg  TID as her new regimen. We updated our pain contract today as well. I gave her three scripts of #90 to last three months, she will see me in a visit for all future refills.

## 2016-06-19 NOTE — Assessment & Plan Note (Signed)
Exam today shows a small callus on the medial aspect of her great toe of her right foot. She is missing the toenail on this foot as well. She has never had much opportunity to wear diabetic shoes, usually comes in our clinic in flip-flops. I am going to place an order for diabetic shoes given this recurrent callus.

## 2016-06-19 NOTE — Progress Notes (Signed)
Assessment and Plan:  See Encounters tab for problem-based medical decision making.   __________________________________________________________  HPI:   55 year old woman here for follow-up of chronic pain due to lumbar spinal stenosis. She missed her last appointment in June because she has difficulty with transportation. She lives with her sister, doesn't work, stays at home throughout the day. Chronic pain generator is her lumbar spine with degenerative disc disease, facet disease, and spinal stenosis. She follows with the neurosurgery clinic and tells me that they're considering an operation but they need to do a diagnostic myelogram first. She uses Dilaudid throughout the day, 2 mg tablets, 3-4 tablets daily to help her function. She tells me that she thinks that hydrocodone in the past was more helpful. There has been some interruption in her perscriptions because of changing PCP and transportation issues.   She denies any side effects to these medications. She was reporting a lot of daytime drowsiness at our last visit. Subsequently she had a sleep study that returned normal. [facility] where she receives alprazolam and zolpidem for management of her generalized anxiety and insomnia disorders. No chest pain or shortness of breath.   __________________________________________________________  Problem List: Patient Active Problem List   Diagnosis Date Noted  . Chronic pain with drug dependence (HCC) 02/08/2016    Priority: High  . Spinal stenosis of lumbar region 03/01/2011    Priority: High  . Diabetes mellitus type 2, uncontrolled (HCC) 07/06/2006    Priority: High  . Generalized anxiety disorder 07/06/2006    Priority: High  . AVN (avascular necrosis of bone) (HCC) 03/07/2013    Priority: Medium  . Hypothyroidism 07/06/2006    Priority: Medium  . Essential hypertension 07/06/2006    Priority: Medium  . Dyspnea 07/06/2006    Priority: Medium  . Callus  of foot 05/14/2013    Priority: Low  . Preventative health care 05/14/2013    Priority: Low  . Hyperlipemia 07/06/2006    Priority: Low  . GERD 07/06/2006    Priority: Low    Medications: Reconciled today in Epic __________________________________________________________  Physical Exam:  Vital Signs: Vitals:   06/19/16 0929  BP: 132/76  Pulse: 98  Temp: 98.1 F (36.7 C)  TempSrc: Oral  SpO2: 97%  Weight: 221 lb 4.8 oz (100.4 kg)  Height: 5\' 1"  (1.549 m)    Gen: Chronically ill appearing woman Neck: No cervical LAD, No thyromegaly or nodules, well healing cyst excision on the left neck. CV: tachycardic, no murmurs Pulm: Normal effort, CTA throughout, no wheezing Abd: Soft, NT, ND, normal BS.  Ext: Warm, no edema, normal joints Neuro: alert and conversational. Very slow to stand and unsteady gait. Normal strength in upper extremities.

## 2016-06-19 NOTE — Assessment & Plan Note (Signed)
Chronic pain generator that has led to her near total disability. She follows with Dr. Lovell Sheehan with neurosurgery and tells me she is planning a myelogram and potentially surgical intervention. She has a rolling walker for fall risk.

## 2016-06-19 NOTE — Assessment & Plan Note (Signed)
A1c 7.4% today. Plan to continue metformin and empagliflozin. Foot exam was normal today. She uses pravastatin for primary prevention of CVD.

## 2016-06-19 NOTE — Assessment & Plan Note (Signed)
Long-time tobacco smoker with chronic exertional dyspnea. Previously questioned to be asthma and she is using Symbicort and albuterol as needed. Pulmonary function testing from 2014 showed normal airflows with no significant obstruction. Sleep study from 2016 was also normal. Likely her dyspnea is multifactorial with deconditioning a major components. I think in the future I would like to start to wean down her inhaled corticosteroids as I don't think they are helping her much without clear evidence of asthma or COPD.

## 2016-06-19 NOTE — Assessment & Plan Note (Signed)
Good compliance with Synthroid 88 g daily. Plan to continue this dose for now and check TSH today.

## 2016-06-20 LAB — BMP8+ANION GAP
Anion Gap: 25 mmol/L — ABNORMAL HIGH (ref 10.0–18.0)
BUN/Creatinine Ratio: 19 (ref 9–23)
BUN: 14 mg/dL (ref 6–24)
CALCIUM: 10.3 mg/dL — AB (ref 8.7–10.2)
CHLORIDE: 97 mmol/L (ref 96–106)
CO2: 20 mmol/L (ref 18–29)
Creatinine, Ser: 0.73 mg/dL (ref 0.57–1.00)
GFR calc Af Amer: 107 mL/min/{1.73_m2} (ref >59)
GFR calc non Af Amer: 93 mL/min/{1.73_m2} (ref >59)
GLUCOSE: 142 mg/dL — AB (ref 65–99)
Potassium: 4.4 mmol/L (ref 3.5–5.2)
Sodium: 142 mmol/L (ref 134–144)

## 2016-06-20 LAB — TSH: TSH: 5.94 u[IU]/mL — AB (ref 0.450–4.500)

## 2016-06-21 ENCOUNTER — Encounter: Payer: Self-pay | Admitting: Student in an Organized Health Care Education/Training Program

## 2016-06-23 ENCOUNTER — Other Ambulatory Visit: Payer: Self-pay | Admitting: Student in an Organized Health Care Education/Training Program

## 2016-06-27 ENCOUNTER — Other Ambulatory Visit: Payer: Self-pay | Admitting: *Deleted

## 2016-06-27 MED ORDER — LEVOTHYROXINE SODIUM 88 MCG PO TABS: ORAL_TABLET | ORAL | 5 refills | Status: DC

## 2016-06-28 ENCOUNTER — Ambulatory Visit (HOSPITAL_COMMUNITY)
Admission: EM | Admit: 2016-06-28 | Discharge: 2016-06-28 | Disposition: A | Discharge status: Home / Self Care | Payer: Commercial Managed Care - HMO | Attending: Family Medicine | Admitting: Family Medicine

## 2016-06-28 ENCOUNTER — Encounter (HOSPITAL_COMMUNITY): Payer: Self-pay | Admitting: *Deleted

## 2016-06-28 ENCOUNTER — Telehealth: Payer: Self-pay

## 2016-06-28 DIAGNOSIS — R350 Frequency of micturition: Secondary | ICD-10-CM | POA: Diagnosis not present

## 2016-06-28 DIAGNOSIS — B373 Candidiasis of vulva and vagina: Secondary | ICD-10-CM | POA: Diagnosis not present

## 2016-06-28 DIAGNOSIS — R81 Glycosuria: Secondary | ICD-10-CM | POA: Diagnosis not present

## 2016-06-28 DIAGNOSIS — B3731 Acute candidiasis of vulva and vagina: Secondary | ICD-10-CM

## 2016-06-28 DIAGNOSIS — E1165 Type 2 diabetes mellitus with hyperglycemia: Secondary | ICD-10-CM | POA: Diagnosis not present

## 2016-06-28 LAB — POCT URINALYSIS DIP (DEVICE)
Bilirubin Urine: NEGATIVE
GLUCOSE, UA: 500 mg/dL — AB
Hgb urine dipstick: NEGATIVE
KETONES UR: NEGATIVE mg/dL
Leukocytes, UA: NEGATIVE
NITRITE: NEGATIVE
PROTEIN: NEGATIVE mg/dL
Specific Gravity, Urine: <=1.005 (ref 1.005–1.030)
UROBILINOGEN UA: 0.2 mg/dL (ref 0.0–1.0)
pH: 5.5 (ref 5.0–8.0)

## 2016-06-28 LAB — TOXASSURE SELECT,+ANTIDEPR,UR: PDF: 0

## 2016-06-28 MED ORDER — FLUCONAZOLE 150 MG PO TABS: ORAL_TABLET | ORAL | 0 refills | Status: DC

## 2016-06-28 MED ORDER — MICONAZOLE NITRATE 2 % EX CREA: 1.0000 "application " | TOPICAL_CREAM | Freq: Two times a day (BID) | CUTANEOUS | 0 refills | Status: DC

## 2016-06-28 NOTE — ED Provider Notes (Signed)
CSN: [id number]     Arrival date & time 06/28/16  1845 History   First MD Initiated Contact with Patient 06/28/16 1953     Chief Complaint  Patient presents with  . Urinary Frequency   (Consider location/radiation/quality/duration/timing/severity/associated sxs/prior Treatment) 55 year old obese female with type 2 diabetes mellitus complaining of vaginal pain and burning and itching for 1-2 days. She is also complaining of dysuria and urinary frequency for 2 days.      Past Medical History:  Diagnosis Date  . Acne rosacea   . Allergic rhinitis   . Anxiety state, unspecified   . Atrophic vaginitis   . Atypical chest pain 12/11/2008   Normal stress nuclear study 01/12/2009 by Dr. Peter Martinique  . Bilateral leg edema   . Carpal tunnel syndrome   . Chronic interstitial cystitis    Followed by Dr. Franchot Gallo  . COPD (chronic obstructive pulmonary disease) ([place])   . DDD (degenerative disc disease), lumbar   . Degeneration of cervical intervertebral disc   . Depression   . Diabetes mellitus, type 2 ([place])   . Dysrhythmia   . Elevated transaminase level    Mild elevation, AST=48, ALT=51 on 02/01/07.  Marland Kitchen GERD (gastroesophageal reflux disease)   . Headache(784.0)   . Hematochezia   . Heme positive stool    EGD on 04/08/2010 by Dr. Watt Climes showed chronic gastritis and a few gastric polyps; pathology showed a fundic gland polyp.  Colonoscopy on 04/08/2010 showed small external and internal hemorrhoids, and a few benign-appearing diminutive polyps in the rectum, the distal sigmoid colon, and in the distal descending colon; pathology showed hyperplastic polyps.   . Hypercholesteremia   . Hypertension   . Hypothyroidism   . Iron deficiency anemia   . Irregular menses   . Left knee pain    Following fall 11/15/2009.  Marland Kitchen Low back pain   . Obesity   . Pneumonia   . Shortness of breath   . Spinal stenosis   . Unspecified asthma(493.90)   . Vagina bleeding   . Viral warts, unspecified     Right hand.   Past Surgical History:  Procedure Laterality Date  . CARPAL TUNNEL RELEASE  05/08/2000   By Dr. Youlanda Mighty. Sypher, Brooke Bonito.   Family History  Problem Relation Age of Onset  . Heart attack Father 6  . Diabetes Mother   . Hypertension Mother   . Diabetes Sister   . Breast cancer Neg Hx   . Colon cancer Neg Hx    Social History  Substance Use Topics  . Smoking status: Former Smoker    Packs/day: 1.00    Years: 15.00    Types: Cigarettes    Quit date: 01/18/1996  . Smokeless tobacco: Never Used  . Alcohol use No   OB History    No data available     Review of Systems  Constitutional: Negative.   HENT: Negative.   Respiratory: Negative.   Gastrointestinal: Negative.   Genitourinary: Positive for dysuria and frequency. Negative for vaginal discharge.  Musculoskeletal: Negative.   Neurological: Negative.   All other systems reviewed and are negative.   Allergies  Codeine sulfate; Meperidine and related; Minocycline hcl; Penicillins; Propoxyphene n-acetaminophen; and Tetracycline hcl  Home Medications   Prior to Admission medications   Medication Sig Start Date End Date Taking? Authorizing Provider  acetaminophen (TYLENOL) 500 MG tablet Take 1,000 mg by mouth every 8 (eight) hours as needed for mild pain or headache. Reported on 02/07/2016  Historical Provider, MD  albuterol (PROAIR HFA) 108 (90 BASE) MCG/ACT inhaler Inhale 2 puffs into the lungs every 6 (six) hours as needed for wheezing or shortness of breath. 05/25/14   Bertha Stakes, MD  ALPRAZolam Duanne Moron) 1 MG tablet Take 1 mg by mouth 4 (four) times daily as needed for anxiety.    Historical Provider, MD  amitriptyline (ELAVIL) 25 MG tablet Take 1 tablet (25 mg total) by mouth at bedtime. Patient taking differently: Take 50 mg by mouth at bedtime.  08/27/14   Bertha Stakes, MD  Blood Glucose Monitoring Suppl (ACCU-CHEK COMPACT CARE KIT) KIT Use to test blood glucose one time daily. Dx:250.00 Patient not  taking: Reported on 01/10/2016 03/18/14   Bertha Stakes, MD  budesonide-formoterol [facility]) 160-4.5 MCG/ACT inhaler Inhale 2 puffs into the lungs 2 (two) times daily.    Historical Provider, MD  diclofenac sodium (VOLTAREN) 1 % GEL Apply 4 g topically 4 (four) times daily. 04/14/16   Liberty Handy, MD  empagliflozin (JARDIANCE) 10 MG TABS tablet Take 10 mg by mouth daily. 04/14/16   Liberty Handy, MD  enalapril (VASOTEC) 2.5 MG tablet Take 1 tablet (2.5 mg total) by mouth daily. 04/14/16   Liberty Handy, MD  erythromycin ethylsuccinate (EES) 400 MG tablet Take 400 mg by mouth 2 (two) times daily. Maintenance for Rosacea    Historical Provider, MD  esomeprazole (NEXIUM) 40 MG capsule Take 1 capsule (40 mg total) by mouth daily. 08/12/15   Burgess Estelle, MD  estradiol (ESTRACE) 0.1 MG/GM vaginal cream Apply 1 gram per vagina every night for 2 weeks, then apply three times a week 11/24/14   Donnamae Jude, MD  fluconazole (DIFLUCAN) 150 MG tablet 1 tab po x 1. May repeat in 72 hours if no improvement 06/28/16   Janne Napoleon, NP  FLUoxetine (PROZAC) 20 MG capsule Take 20 mg by mouth daily.  11/15/13   Historical Provider, MD  fluticasone (FLONASE) 50 MCG/ACT nasal spray Place 2 sprays into both nostrils daily.    Historical Provider, MD  glucose blood (ACCU-CHEK COMPACT TEST DRUM) test strip Use to check blood sugar 1 time daily. diag code E11.9. Non insulin dependent Patient not taking: Reported on 01/10/2016 12/08/15   Axel Filler, MD  HYDROcodone-acetaminophen [facility]) 7.5-325 MG tablet Take 1 tablet by mouth 3 (three) times daily as needed for moderate pain. 06/19/16   Axel Filler, MD  ipratropium (ATROVENT HFA) 17 MCG/ACT inhaler Inhale 2 puffs into the lungs 4 (four) times daily as needed for wheezing. 10/09/14   Bertha Stakes, MD  KRISTALOSE 20 G packet Take 20 g by mouth 2 (two) times daily as needed (constipation).  05/13/12   Historical Provider, MD  levothyroxine (SYNTHROID) 88 MCG tablet TAKE 1  TABLET (88 MCG TOTAL) BY MOUTH DAILY BEFORE BREAKFAST. 06/27/16   Axel Filler, MD  metFORMIN (GLUCOPHAGE-XR) 500 MG 24 hr tablet Take 1 tablet (500 mg total) by mouth 2 (two) times daily. 01/06/16   Axel Filler, MD  metFORMIN (GLUCOPHAGE-XR) 500 MG 24 hr tablet TAKE 1 TABLET (500 MG TOTAL) BY MOUTH 2 (TWO) TIMES DAILY. 06/26/16   Axel Filler, MD  miconazole (MICOTIN) 2 % cream Apply 1 application topically 2 (two) times daily. 06/28/16   Janne Napoleon, NP  nystatin (NYSTATIN) powder APPLY TOPICALLY TWO TIMES A DAY TO AFFECTED AREAS OF SKIN FOR 10 DAYS. 04/14/16   Liberty Handy, MD  potassium chloride (KLOR-CON M10) 10 MEQ tablet Take 2 tablets (20 mEq total)  by mouth daily. 04/14/16   Liberty Handy, MD  pravastatin (PRAVACHOL) 10 MG tablet Take 1 tablet (10 mg total) by mouth every evening. 02/02/16 02/01/17  Axel Filler, MD  zolpidem (AMBIEN) 10 MG tablet Take 5 mg by mouth at bedtime as needed for sleep.     Historical Provider, MD   Meds Ordered and Administered this Visit  Medications - No data to display  BP 107/63 (BP Location: Left Arm)   Pulse 94   Temp 98.8 F (37.1 C) (Oral)   LMP 01/17/2009   SpO2 97%  No data found.   Physical Exam  Constitutional: She is oriented to person, place, and time. She appears well-developed and well-nourished. No distress.  Eyes: EOM are normal.  Neck: Normal range of motion. Neck supple.  Cardiovascular: Normal rate, regular rhythm and normal heart sounds.   Pulmonary/Chest: Breath sounds normal. No respiratory distress.  Abdominal:  Abdomen obese, soft, palpation of the anterior abdomen produces pain in the back. Back pain is chronic.  Genitourinary:  Genitourinary Comments: Harriet Pho, EMT present External exam reveals moist erythema to the labia minora and mucosal last SPECT of the labia majora as well as the perineum and gluteal cleft. No drainage is observed.  Musculoskeletal: She exhibits no edema.  Neurological:  She is alert and oriented to person, place, and time. She exhibits normal muscle tone.  Skin: Skin is warm and dry. She is not diaphoretic.  Psychiatric: She has a normal mood and affect.  Nursing note and vitals reviewed.   Urgent Care Course   Clinical Course    Procedures (including critical care time)  Labs Review Labs Reviewed  POCT URINALYSIS DIP (DEVICE) - Abnormal; Notable for the following:       Result Value   Glucose, UA 500 (*)    All other components within normal limits   Results for orders placed or performed during the hospital encounter of 06/28/16  POCT urinalysis dip (device)  Result Value Ref Range   Glucose, UA 500 (A) NEGATIVE mg/dL   Bilirubin Urine NEGATIVE NEGATIVE   Ketones, ur NEGATIVE NEGATIVE mg/dL   Specific Gravity, Urine <=1.005 1.005 - 1.030   Hgb urine dipstick NEGATIVE NEGATIVE   pH 5.5 5.0 - 8.0   Protein, ur NEGATIVE NEGATIVE mg/dL   Urobilinogen, UA 0.2 0.0 - 1.0 mg/dL   Nitrite NEGATIVE NEGATIVE   Leukocytes, UA NEGATIVE NEGATIVE     Imaging Review No results found.   Visual Acuity Review  Right Eye Distance:   Left Eye Distance:   Bilateral Distance:    Right Eye Near:   Left Eye Near:    Bilateral Near:         MDM   1. Yeast vaginitis   2. Urinary frequency   3. Glycosuria   4. Type 2 diabetes mellitus with hyperglycemia, without long-term current use of insulin (HCC)    Take the medication as directed for her yeast infection. Do not apply any more of the steroid cream. Drink plenty of fluids and stay well-hydrated. You are showing sugar in your urine. This is the likely reason you are having urinary frequency. Call your doctor tomorrow for follow-up appointment. Check your blood sugars frequently and record. You may need to have medication changes.  Meds ordered this encounter  Medications  . fluconazole (DIFLUCAN) 150 MG tablet    Sig: 1 tab po x 1. May repeat in 72 hours if no improvement    Dispense:  2  tablet    Refill:  0    Order Specific Question:   Supervising Provider    Answer:   Billy Fischer [phone]  . miconazole (MICOTIN) 2 % cream    Sig: Apply 1 application topically 2 (two) times daily.    Dispense:  28.35 g    Refill:  0    Order Specific Question:   Supervising Provider    Answer:   Billy Fischer [[id number]]      Janne Napoleon, NP 06/28/16 2029

## 2016-06-28 NOTE — Discharge Instructions (Signed)
Take the medication as directed for her yeast infection. Do not apply any more of the steroid cream. Drink plenty of fluids and stay well-hydrated. You are showing sugar in your urine. This is the likely reason you are having urinary frequency. Call your doctor tomorrow for follow-up appointment. Check your blood sugars frequently and record. You may need to have medication changes.

## 2016-06-28 NOTE — Telephone Encounter (Signed)
Needs to speak with a nurse regarding Jardiance.

## 2016-06-28 NOTE — Telephone Encounter (Signed)
Complaints of vaginal redness and irritation and burning on urination with vague stomach pain. Denies fever or chills or any abnormal discharge. Patient's mother passed away yesterday and is unable to come for appointment tomorrow d/t funeral and arraignments. Offered appointment for Friday, but patient not sure if she can make it. Did not want to wait until Monday. Suggested that patient could go to Urgent Care if she needed. Patient agreed.

## 2016-06-28 NOTE — ED Triage Notes (Signed)
Pt    Reports     Symptoms  X  2  Days of  urinary  Frequency         And       Weakness   wiih  Burning    Sensation      Redness  And  itrritation  To her vaginal  Area   Pt  Reports  She  Has   Been under a  Lot  Of  Stress  Lately

## 2016-07-03 ENCOUNTER — Ambulatory Visit (INDEPENDENT_AMBULATORY_CARE_PROVIDER_SITE_OTHER): Payer: Commercial Managed Care - HMO | Admitting: Internal Medicine

## 2016-07-03 ENCOUNTER — Encounter: Payer: Self-pay | Admitting: Internal Medicine

## 2016-07-03 VITALS — BP 125/62 | HR 99 | Temp 97.9°F | Ht 61.0 in | Wt 220.8 lb

## 2016-07-03 DIAGNOSIS — Z7984 Long term (current) use of oral hypoglycemic drugs: Secondary | ICD-10-CM

## 2016-07-03 DIAGNOSIS — E1140 Type 2 diabetes mellitus with diabetic neuropathy, unspecified: Secondary | ICD-10-CM

## 2016-07-03 DIAGNOSIS — B373 Candidiasis of vulva and vagina: Secondary | ICD-10-CM | POA: Diagnosis not present

## 2016-07-03 DIAGNOSIS — E1165 Type 2 diabetes mellitus with hyperglycemia: Secondary | ICD-10-CM | POA: Diagnosis not present

## 2016-07-03 DIAGNOSIS — B372 Candidiasis of skin and nail: Secondary | ICD-10-CM | POA: Diagnosis not present

## 2016-07-03 DIAGNOSIS — B3731 Acute candidiasis of vulva and vagina: Secondary | ICD-10-CM

## 2016-07-03 LAB — POCT URINALYSIS DIPSTICK
BILIRUBIN UA: NEGATIVE
KETONES UA: NEGATIVE
NITRITE UA: NEGATIVE
PH UA: 5
Protein, UA: NEGATIVE
Spec Grav, UA: <=1.005
Urobilinogen, UA: 0.2

## 2016-07-03 MED ORDER — CLOTRIMAZOLE 1 % EX CREA: 1.0000 "application " | TOPICAL_CREAM | Freq: Two times a day (BID) | CUTANEOUS | 0 refills | Status: DC

## 2016-07-03 MED ORDER — METFORMIN HCL ER 500 MG PO TB24: 1000.0000 mg | ORAL_TABLET | Freq: Two times a day (BID) | ORAL | 2 refills | Status: DC

## 2016-07-03 MED ORDER — NYSTATIN 100000 UNIT/GM EX POWD: CUTANEOUS | 1 refills | Status: DC

## 2016-07-03 NOTE — Patient Instructions (Signed)
STOP TAKING EMPAGLIFLOZIN (JARDIANCE).  TAKE METFORMIN 1000 MG TWICE A DAY (TWO PILLS IN THE MORNING AND TWO AT NIGHT)  USE THE NYSTATIN POWDER IN THE GROIN AREA (OUTSIDE SKIN FOLDS AND SURROUNDING AREA)  USE THE CLOTRIMAZOLE CREAM ON THE INSIDE OF THE VAGINAL LIPS AND OUTSIDE OF THE VAGINAL LIPS. IT DOES NOT NEED TO BE INSERTED IN THE VAGINA.  FOLLOW UP IN ONE MONTH OR SOONER IF NEEDED.

## 2016-07-03 NOTE — Assessment & Plan Note (Signed)
In May 2017, patient was started on Jardiance and her most recent A1c was 7.4. UA in clinic is negative for infection but shows greater than 1000 glucose. Candidal Vulvovaginits is likely secondary to glucosuria, poor hygiene and obesity acting as a nidus for a candidal infection.   Plan:  -Discontinue Jardiance -Increase Metformin to 1000 mg BID -Follow up in one month

## 2016-07-03 NOTE — Assessment & Plan Note (Signed)
For the candidal intertrigo located in groin folds, will prescribed the Nystatin powder. Patient should apply and allow time to "air dry" and also use cotton undergarments.

## 2016-07-03 NOTE — Assessment & Plan Note (Signed)
Patient recently developed vaginal pruritus, discomfort, and dysuria on 06/28/16. She was seen in the ED. UA at that time was negative for ketones, nitrites or leukocytes, but had >500 glucose. She was diagnosed with candidal vaginitis and discharged with diflucan 150 mg po once and repeat in 48 hours in addition to miconazole vaginal suppositories. She has had UTIs and yeast infections in the past, but not recently. She denies suprapubic pain, fever, chills, or nausea. She has been compliant with the prescribed medicaitons but states she continues to have the symptoms of dysuria, and vaginal pruritus. She denies known vaginal discharge. In May 2017, patient was started on Jardiance and her most recent A1c was 7.4. UA in clinic is negative for infection but shows greater than 1000 glucose. Candidal Vulvovaginits is likely secondary to glucosuria, poor hygiene and obesity acting as a nidus for a candidal infection. I do not feel a vaginal yeast infection is the main problem.   Assessment: Candidal Vulvovaginitis with surrouding Candidal Intertrigo  Plan: -Nystatin powder for skin folds -Clotrimazole for Vulvar irritation -Keep area clean and dry -Discontinue Jardiance

## 2016-07-03 NOTE — Progress Notes (Signed)
CC: vaginal itching, dysuria  HPI: [name] is a 55 y.o. female with PMHx of HTN, T2DM, Obesity who presents to the clinic for vaginal pruritus and dysuria.   Patient recently developed vaginal pruritus, discomfort, and dysuria on 06/28/16. She was seen in the ED. UA at that time was negative for ketones, nitrites or leukocytes, but had >500 glucose. She was diagnosed with candidal vaginitis and discharged with diflucan 150 mg po once and repeat in 48 hours in addition to miconazole vaginal suppositories. She has had UTIs and yeast infections in the past, but not recently. She denies suprapubic pain, fever, chills, or nausea. She has been compliant with the prescribed medicaitons but states she continues to have the symptoms of dysuria, and vaginal pruritus. She denies known vaginal discharge. In May 2017, patient was started on Jardiance and her most recent A1c was 7.4.   Past Medical History:  Diagnosis Date  . Acne rosacea   . Allergic rhinitis   . Anxiety state, unspecified   . Atrophic vaginitis   . Atypical chest pain 12/11/2008   Normal stress nuclear study 01/12/2009 by Dr. Peter SwazilandJordan  . Bilateral leg edema   . Carpal tunnel syndrome   . Chronic interstitial cystitis    Followed by Dr. Marcine MatarStephen Dahlstedt  . COPD (chronic obstructive pulmonary disease) ([facility])   . DDD (degenerative disc disease), lumbar   . Degeneration of cervical intervertebral disc   . Depression   . Diabetes mellitus, type 2 ([facility])   . Dysrhythmia   . Elevated transaminase level    Mild elevation, AST=48, ALT=51 on 02/01/07.  Marland Kitchen. GERD (gastroesophageal reflux disease)   . Headache(784.0)   . Hematochezia   . Heme positive stool    EGD on 04/08/2010 by Dr. Ewing SchleinMagod showed chronic gastritis and a few gastric polyps; pathology showed a fundic gland polyp.  Colonoscopy on 04/08/2010 showed small external and internal hemorrhoids, and a few benign-appearing diminutive polyps in the rectum, the distal sigmoid  colon, and in the distal descending colon; pathology showed hyperplastic polyps.   . Hypercholesteremia   . Hypertension   . Hypothyroidism   . Iron deficiency anemia   . Irregular menses   . Left knee pain    Following fall 11/15/2009.  Marland Kitchen. Low back pain   . Obesity   . Pneumonia   . Shortness of breath   . Spinal stenosis   . Unspecified asthma(493.90)   . Vagina bleeding   . Viral warts, unspecified    Right hand.    Review of Systems: A complete ROS was negative except as noted in HPI.   Physical Exam: Vitals:   07/03/16 1349  BP: 125/62  Pulse: 99  Temp: 97.9 F (36.6 C)  TempSrc: Oral  SpO2: 95%  Weight: 220 lb 12.8 oz (100.2 kg)  Height: 5\' 1"  (1.549 m)   General: Vital signs reviewed.  Patient is obese, chronically ill appearing, in no acute distress and cooperative with exam.  Cardiovascular: RRR, S1 normal, S2 normal, no murmurs, gallops, or rubs. Pulmonary/Chest: Clear to auscultation bilaterally, no wheezes, rales, or rhonchi. Abdominal: Soft, non-tender, non-distended, BS +, no suprapubic pain.  GU: Pelvic exam: VULVA: vulvar edema and vulvar erythema noted bilaterally and extending to rectum, VAGINA: vaginal erythema but no discharge present, exam chaperoned by Morrie SheldonAshley, RN. Evidence of moist erythematous candidal intertrigo in groin.  Assessment & Plan:  See encounters tab for problem based medical decision making. Patient discussed with Dr. Criselda PeachesMullen

## 2016-07-04 ENCOUNTER — Telehealth: Payer: Self-pay | Admitting: *Deleted

## 2016-07-04 DIAGNOSIS — Z124 Encounter for screening for malignant neoplasm of cervix: Secondary | ICD-10-CM

## 2016-07-05 NOTE — Telephone Encounter (Signed)
-----   Message from Nelda BucksLela W Sturdivant sent at 07/05/2016 10:05 AM EDT ----- DR Oswaldo DoneVINCENT / PATIENT REQUESTING GYN REFERRAL, JUST SEEN ABOUT 2 DAY AGO. CALLED PATIENT YESTERDAY, LVM. THANK LELA ----- Message ----- From: Lianne Bushyharsetta L Hayes Sent: 07/03/2016   2:50 PM To: Jacob Mooreshilon F Boone, Lela W Sturdivant  PT IS REQUESTING REFERRAL TO GYN OFFICE,DR.DOVE IS PREFERENCE.  PT HAS [employer].

## 2016-07-05 NOTE — Telephone Encounter (Signed)
Referral to gynecology may not be necessary. Would you please remind her that she does not need another PAP smear until 2020. She does need a mammogram which I have ordered. I placed the gyn referral in case she has other issues, like discharge, but all she needs for routine care this year is a mammogram.

## 2016-07-10 DIAGNOSIS — F331 Major depressive disorder, recurrent, moderate: Secondary | ICD-10-CM | POA: Diagnosis not present

## 2016-07-13 NOTE — Progress Notes (Signed)
Internal Medicine Clinic Attending  Case discussed with Dr. Burns at the time of the visit.  We reviewed the resident's history and exam and pertinent patient test results.  I agree with the assessment, diagnosis, and plan of care documented in the resident's note.  

## 2016-07-20 ENCOUNTER — Other Ambulatory Visit: Payer: Self-pay

## 2016-07-20 NOTE — Telephone Encounter (Signed)
Requesting hydrocodone to be filled.  

## 2016-07-20 NOTE — Telephone Encounter (Signed)
Last office visit w/pcp: 07/03/2016 Last UDS: 06/19/2016  Last Refill: 06/19/2016 Next appt: 09/18/2016  I contacted pharmacy to confirm last refill date and was informed that pt has two rxs on hold at the pharamacy-pt forgot she dropped them off.  No additional rx needed.  Phone call complete.Mary Gray, Mary Mabey Cassady8/31/20172:40 PM

## 2016-07-25 ENCOUNTER — Encounter (HOSPITAL_COMMUNITY): Payer: Self-pay | Admitting: *Deleted

## 2016-07-25 ENCOUNTER — Ambulatory Visit: Payer: Commercial Managed Care - HMO

## 2016-07-25 ENCOUNTER — Telehealth: Payer: Self-pay | Admitting: Internal Medicine

## 2016-07-25 ENCOUNTER — Ambulatory Visit (HOSPITAL_COMMUNITY)
Admission: EM | Admit: 2016-07-25 | Discharge: 2016-07-25 | Disposition: A | Discharge status: Home / Self Care | Payer: Commercial Managed Care - HMO | Attending: Family Medicine | Admitting: Family Medicine

## 2016-07-25 DIAGNOSIS — R300 Dysuria: Secondary | ICD-10-CM | POA: Diagnosis present

## 2016-07-25 DIAGNOSIS — Z881 Allergy status to other antibiotic agents status: Secondary | ICD-10-CM | POA: Insufficient documentation

## 2016-07-25 DIAGNOSIS — Z833 Family history of diabetes mellitus: Secondary | ICD-10-CM | POA: Insufficient documentation

## 2016-07-25 DIAGNOSIS — E785 Hyperlipidemia, unspecified: Secondary | ICD-10-CM | POA: Diagnosis not present

## 2016-07-25 DIAGNOSIS — Z87891 Personal history of nicotine dependence: Secondary | ICD-10-CM | POA: Diagnosis not present

## 2016-07-25 DIAGNOSIS — F329 Major depressive disorder, single episode, unspecified: Secondary | ICD-10-CM | POA: Diagnosis not present

## 2016-07-25 DIAGNOSIS — Z888 Allergy status to other drugs, medicaments and biological substances status: Secondary | ICD-10-CM | POA: Diagnosis not present

## 2016-07-25 DIAGNOSIS — Z7951 Long term (current) use of inhaled steroids: Secondary | ICD-10-CM | POA: Insufficient documentation

## 2016-07-25 DIAGNOSIS — Z885 Allergy status to narcotic agent status: Secondary | ICD-10-CM | POA: Diagnosis not present

## 2016-07-25 DIAGNOSIS — K219 Gastro-esophageal reflux disease without esophagitis: Secondary | ICD-10-CM | POA: Diagnosis not present

## 2016-07-25 DIAGNOSIS — Z8249 Family history of ischemic heart disease and other diseases of the circulatory system: Secondary | ICD-10-CM | POA: Insufficient documentation

## 2016-07-25 DIAGNOSIS — Z79899 Other long term (current) drug therapy: Secondary | ICD-10-CM | POA: Diagnosis not present

## 2016-07-25 DIAGNOSIS — J449 Chronic obstructive pulmonary disease, unspecified: Secondary | ICD-10-CM | POA: Insufficient documentation

## 2016-07-25 DIAGNOSIS — N762 Acute vulvitis: Secondary | ICD-10-CM | POA: Insufficient documentation

## 2016-07-25 DIAGNOSIS — F411 Generalized anxiety disorder: Secondary | ICD-10-CM | POA: Diagnosis not present

## 2016-07-25 DIAGNOSIS — I10 Essential (primary) hypertension: Secondary | ICD-10-CM | POA: Insufficient documentation

## 2016-07-25 DIAGNOSIS — E039 Hypothyroidism, unspecified: Secondary | ICD-10-CM | POA: Diagnosis not present

## 2016-07-25 DIAGNOSIS — Z880 Allergy status to penicillin: Secondary | ICD-10-CM | POA: Insufficient documentation

## 2016-07-25 DIAGNOSIS — N390 Urinary tract infection, site not specified: Secondary | ICD-10-CM | POA: Diagnosis not present

## 2016-07-25 DIAGNOSIS — E119 Type 2 diabetes mellitus without complications: Secondary | ICD-10-CM | POA: Diagnosis not present

## 2016-07-25 LAB — POCT URINALYSIS DIP (DEVICE)
BILIRUBIN URINE: NEGATIVE
Glucose, UA: NEGATIVE mg/dL
KETONES UR: NEGATIVE mg/dL
Nitrite: NEGATIVE
PH: 5.5 (ref 5.0–8.0)
Protein, ur: NEGATIVE mg/dL
Urobilinogen, UA: 0.2 mg/dL (ref 0.0–1.0)

## 2016-07-25 MED ORDER — CIPROFLOXACIN HCL 500 MG PO TABS: 500.0000 mg | ORAL_TABLET | Freq: Two times a day (BID) | ORAL | 0 refills | Status: DC

## 2016-07-25 MED ORDER — CLOTRIMAZOLE-BETAMETHASONE 1-0.05 % EX CREA: TOPICAL_CREAM | CUTANEOUS | 0 refills | Status: DC

## 2016-07-25 NOTE — ED Provider Notes (Addendum)
[place]    CSN: [id number] Arrival date & time: 07/25/16  4097  First Provider Contact:  None       History   Chief Complaint Chief Complaint  Patient presents with  . Polyuria  . Dysuria  . Flank Pain  . Vaginal Itching    HPI Mary Gray is a 55 y.o. female.   This 55 year old woman who presents with polyuria, dysuria, and vaginal irritation.  She's been troubled by these symptoms since the middle of August. She's been seen by her primary care doctor at [facility] outpatient clinic as well as the urgent care here. She's been provided Diflucan, Terazol, and Monistat. Now these have afforded her any relief.  She says she has some left flank pain along with vaginal itching which has become intense.  Patient denies having any sexual intercourse in years.    Urinary Frequency  This is a chronic problem. The current episode started more than 1 week ago. The problem occurs constantly. The problem has not changed since onset.Pertinent negatives include no abdominal pain and no shortness of breath. Nothing aggravates the symptoms. The symptoms are relieved by medications. The treatment provided no relief.    Past Medical History:  Diagnosis Date  . Acne rosacea   . Allergic rhinitis   . Anxiety state, unspecified   . Atrophic vaginitis   . Atypical chest pain 12/11/2008   Normal stress nuclear study 01/12/2009 by Dr. Peter Martinique  . Bilateral leg edema   . Carpal tunnel syndrome   . Chronic interstitial cystitis    Followed by Dr. Franchot Gallo  . COPD (chronic obstructive pulmonary disease) ([place])   . DDD (degenerative disc disease), lumbar   . Degeneration of cervical intervertebral disc   . Depression   . Diabetes mellitus, type 2 ([place])   . Dysrhythmia   . Elevated transaminase level    Mild elevation, AST=48, ALT=51 on 02/01/07.  Marland Kitchen GERD (gastroesophageal reflux disease)   . Headache(784.0)   . Hematochezia   . Heme positive stool    EGD on 04/08/2010  by Dr. Watt Climes showed chronic gastritis and a few gastric polyps; pathology showed a fundic gland polyp.  Colonoscopy on 04/08/2010 showed small external and internal hemorrhoids, and a few benign-appearing diminutive polyps in the rectum, the distal sigmoid colon, and in the distal descending colon; pathology showed hyperplastic polyps.   . Hypercholesteremia   . Hypertension   . Hypothyroidism   . Iron deficiency anemia   . Irregular menses   . Left knee pain    Following fall 11/15/2009.  Marland Kitchen Low back pain   . Obesity   . Pneumonia   . Shortness of breath   . Spinal stenosis   . Unspecified asthma(493.90)   . Vagina bleeding   . Viral warts, unspecified    Right hand.    Patient Active Problem List   Diagnosis Date Noted  . Vulvovaginal candidiasis 07/03/2016  . Chronic pain with drug dependence ([place]) 02/08/2016  . Candidal skin infection 03/03/2014  . Callus of foot 05/14/2013  . Preventative health care 05/14/2013  . AVN (avascular necrosis of bone) ([place]) 03/07/2013  . Spinal stenosis of lumbar region 03/01/2011  . Hypothyroidism 07/06/2006  . Diabetes mellitus type 2, uncontrolled ([place]) 07/06/2006  . Hyperlipemia 07/06/2006  . Generalized anxiety disorder 07/06/2006  . Essential hypertension 07/06/2006  . Dyspnea 07/06/2006  . GERD 07/06/2006    Past Surgical History:  Procedure Laterality Date  . CARPAL TUNNEL RELEASE  05/08/2000   By Dr. Youlanda Mighty. Sypher, Caprice Kluver History    No data available       Home Medications    Prior to Admission medications   Medication Sig Start Date End Date Taking? Authorizing Provider  acetaminophen (TYLENOL) 500 MG tablet Take 1,000 mg by mouth every 8 (eight) hours as needed for mild pain or headache. Reported on 02/07/2016    Historical Provider, MD  albuterol (PROAIR HFA) 108 (90 BASE) MCG/ACT inhaler Inhale 2 puffs into the lungs every 6 (six) hours as needed for wheezing or shortness of breath. 05/25/14   Bertha Stakes, MD    ALPRAZolam Duanne Moron) 1 MG tablet Take 1 mg by mouth 4 (four) times daily as needed for anxiety.    Historical Provider, MD  amitriptyline (ELAVIL) 25 MG tablet Take 1 tablet (25 mg total) by mouth at bedtime. Patient taking differently: Take 50 mg by mouth at bedtime.  08/27/14   Bertha Stakes, MD  Blood Glucose Monitoring Suppl (ACCU-CHEK COMPACT CARE KIT) KIT Use to test blood glucose one time daily. Dx:250.00 Patient not taking: Reported on 01/10/2016 03/18/14   Bertha Stakes, MD  budesonide-formoterol [facility]) 160-4.5 MCG/ACT inhaler Inhale 2 puffs into the lungs 2 (two) times daily.    Historical Provider, MD  ciprofloxacin (CIPRO) 500 MG tablet Take 1 tablet (500 mg total) by mouth 2 (two) times daily. 07/25/16   Robyn Haber, MD  clotrimazole-betamethasone (LOTRISONE) cream Apply to affected area 2 times daily prn 07/25/16   Robyn Haber, MD  diclofenac sodium (VOLTAREN) 1 % GEL Apply 4 g topically 4 (four) times daily. 04/14/16   Liberty Handy, MD  enalapril (VASOTEC) 2.5 MG tablet Take 1 tablet (2.5 mg total) by mouth daily. 04/14/16   Liberty Handy, MD  esomeprazole (NEXIUM) 40 MG capsule Take 1 capsule (40 mg total) by mouth daily. 08/12/15   Burgess Estelle, MD  estradiol (ESTRACE) 0.1 MG/GM vaginal cream Apply 1 gram per vagina every night for 2 weeks, then apply three times a week 11/24/14   Donnamae Jude, MD  FLUoxetine (PROZAC) 20 MG capsule Take 20 mg by mouth daily.  11/15/13   Historical Provider, MD  fluticasone (FLONASE) 50 MCG/ACT nasal spray Place 2 sprays into both nostrils daily.    Historical Provider, MD  glucose blood (ACCU-CHEK COMPACT TEST DRUM) test strip Use to check blood sugar 1 time daily. diag code E11.9. Non insulin dependent Patient not taking: Reported on 01/10/2016 12/08/15   Axel Filler, MD  HYDROcodone-acetaminophen [facility]) 7.5-325 MG tablet Take 1 tablet by mouth 3 (three) times daily as needed for moderate pain. 06/19/16   Axel Filler, MD   ipratropium (ATROVENT HFA) 17 MCG/ACT inhaler Inhale 2 puffs into the lungs 4 (four) times daily as needed for wheezing. 10/09/14   Bertha Stakes, MD  KRISTALOSE 20 G packet Take 20 g by mouth 2 (two) times daily as needed (constipation).  05/13/12   Historical Provider, MD  levothyroxine (SYNTHROID) 88 MCG tablet TAKE 1 TABLET (88 MCG TOTAL) BY MOUTH DAILY BEFORE BREAKFAST. 06/27/16   Axel Filler, MD  metFORMIN (GLUCOPHAGE-XR) 500 MG 24 hr tablet Take 2 tablets (1,000 mg total) by mouth 2 (two) times daily. 07/03/16   Alexa [name] Burke, MD  potassium chloride (KLOR-CON M10) 10 MEQ tablet Take 2 tablets (20 mEq total) by mouth daily. 04/14/16   Liberty Handy, MD  pravastatin (PRAVACHOL) 10 MG tablet Take 1 tablet (10 mg total) by mouth  every evening. 02/02/16 02/01/17  Axel Filler, MD  zolpidem (AMBIEN) 10 MG tablet Take 5 mg by mouth at bedtime as needed for sleep.     Historical Provider, MD    Family History Family History  Problem Relation Age of Onset  . Heart attack Father 75  . Diabetes Mother   . Hypertension Mother   . Diabetes Sister   . Breast cancer Neg Hx   . Colon cancer Neg Hx     Social History Social History  Substance Use Topics  . Smoking status: Former Smoker    Packs/day: 0.50    Years: 15.00    Types: Cigarettes    Quit date: 01/18/1996  . Smokeless tobacco: Never Used  . Alcohol use No     Allergies   Codeine sulfate; Meperidine and related; Minocycline hcl; Penicillins; Propoxyphene n-acetaminophen; and Tetracycline hcl   Review of Systems Review of Systems  Constitutional: Negative.   HENT: Negative.   Eyes: Negative.   Respiratory: Negative.  Negative for shortness of breath.   Cardiovascular: Negative.   Gastrointestinal: Negative.  Negative for abdominal pain.  Endocrine: Negative.   Genitourinary: Positive for dysuria, flank pain, frequency and vaginal pain.  Skin: Negative.   Psychiatric/Behavioral: Negative.      Physical  Exam Triage Vital Signs ED Triage Vitals [07/25/16 1030]  Enc Vitals Group     BP 156/79     Pulse Rate 89     Resp 19     Temp 97.8 F (36.6 C)     Temp Source Oral     SpO2 99 %     Weight      Height      Head Circumference      Peak Flow      Pain Score      Pain Loc      Pain Edu?      Excl. in [place]?    No data found.   Updated Vital Signs BP 156/79 (BP Location: Left Arm)   Pulse 89   Temp 97.8 F (36.6 C) (Oral)   Resp 19   LMP 01/17/2009 Comment: spotting  SpO2 99%   Visual Acuity Results for orders placed or performed during the hospital encounter of 07/25/16  POCT urinalysis dip (device)  Result Value Ref Range   Glucose, UA NEGATIVE NEGATIVE mg/dL   Bilirubin Urine NEGATIVE NEGATIVE   Ketones, ur NEGATIVE NEGATIVE mg/dL   Specific Gravity, Urine <=1.005 1.005 - 1.030   Hgb urine dipstick SMALL (A) NEGATIVE   pH 5.5 5.0 - 8.0   Protein, ur NEGATIVE NEGATIVE mg/dL   Urobilinogen, UA 0.2 0.0 - 1.0 mg/dL   Nitrite NEGATIVE NEGATIVE   Leukocytes, UA MODERATE (A) NEGATIVE    Physical Exam  Constitutional: She appears well-developed and well-nourished.  HENT:  Head: Normocephalic.  Eyes: Conjunctivae are normal. Pupils are equal, round, and reactive to light.  Neck: Normal range of motion. Neck supple.  Pulmonary/Chest: Effort normal.  Abdominal: Soft. There is no tenderness. There is no rebound.  Genitourinary:  Genitourinary Comments: The perineum is diffusely erythematous with a fairly sharp demarcation on the proximal medial thighs. The labia are swollen and red, lichenified with some fissuring.  The vagina is intensely erythematous with no obvious discharge.  Musculoskeletal: Normal range of motion.  Skin: Skin is warm.  Psychiatric: She has a normal mood and affect.  Nursing note and vitals reviewed.    UC Treatments / Results  Labs (all labs  ordered are listed, but only abnormal results are displayed) Labs Reviewed  POCT URINALYSIS DIP  (DEVICE) - Abnormal; Notable for the following:       Result Value   Hgb urine dipstick SMALL (*)    Leukocytes, UA MODERATE (*)    All other components within normal limits  URINE CULTURE  CERVICOVAGINAL ANCILLARY ONLY    EKG  EKG Interpretation None       Radiology No results found.  Procedures Procedures (including critical care time)  Medications Ordered in UC Medications - No data to display   Initial Impression / Assessment and Plan / UC Course  I have reviewed the triage vital signs and the nursing notes.  Pertinent labs & imaging results that were available during my care of the patient were reviewed by me and considered in my medical decision making (see chart for details).  Clinical Course      Final Clinical Impressions(s) / UC Diagnoses   Final diagnoses:  UTI (lower urinary tract infection)  Vulvitis    New Prescriptions New Prescriptions   CIPROFLOXACIN (CIPRO) 500 MG TABLET    Take 1 tablet (500 mg total) by mouth 2 (two) times daily.   CLOTRIMAZOLE-BETAMETHASONE (LOTRISONE) CREAM    Apply to affected area 2 times daily prn     Robyn Haber, MD 07/25/16 1109    Robyn Haber, MD 07/25/16 [phone]

## 2016-07-25 NOTE — ED Triage Notes (Addendum)
Patient reports left flank pain, polyuria, dysuira, and vaginal redness and discomfort. Patient states she feels like she has a urinary tract infection and a yeast infection. No fevers. Patient has used miconalize cream, clotrimazole cream, and fluconazole for vaginal itching. Patient is a diabetic.

## 2016-07-25 NOTE — Telephone Encounter (Signed)
   Reason for call:   I received a call from [name] at 3:46 AM indicating that her vulvovaginal candidiasis has flared up and that she is having UTI symptoms now.   Pertinent Data:   Patient seen last [facility] urgent care (8/9[facility] for her vulvovaginal candidiasis. UA on both visits were not suspicious for UTI. She was originally treated with oral Diflucan and Miconazole suppositories by urgent care.  [facility] with change in therapy to clotrimazole cream and nystatin powder. For her T2DM, her [place] PepperJardiance was also discontinued and her Metformin increased to 1000 mg BID. She reports compliance to these changes.  She states that her candidiasis initially improved, but over the last several days has flared back up with erythematous vaginal rash and pruritis. She has avoided scratching the area. She also reports 3 days of burning/stinging with urination, increased frequency, and cloudy discoloration without hematuria. She says this is similar to previous UTI symptoms she has experienced.  She denies any fever, chills, nausea, or suprapubic pain. No recent antibiotics.   Assessment / Plan / Recommendations:   Patient with flare up of vulvovaginal candidiasis after initial improvement despite 2-3 weeks of topical nystatin powder and clotrimazole cream. Symptoms are localized per patient without systemic signs/symptoms.  Her dysuria may be due to new UTI versus local burning/irritation from topical medications.  I advised the patient to continue her current medications and call our clinic today during open hours for an acute care visit for possible change in topical therapy  Or addition of oral agent. She should also be assessed for UTI with a UA and treated if indicated. She understands and agrees to the plan.  As always, pt is advised that if symptoms worsen or new symptoms arise, they should go to an urgent care facility or to to ER for further evaluation.    Darreld McleanVishal Cheyenne Bordeaux, MD   07/25/2016, 4:12 AM

## 2016-07-25 NOTE — Discharge Instructions (Signed)
You have a urinary tract infection and a dermatitis of the vulva.  I have prescribed an antibiotic for the urinary infection and a cream to relieve the irritated labia.

## 2016-07-26 LAB — URINE CULTURE

## 2016-07-26 LAB — CERVICOVAGINAL ANCILLARY ONLY: Wet Prep (BD Affirm): NEGATIVE

## 2016-07-31 ENCOUNTER — Encounter: Payer: Self-pay | Admitting: Obstetrics and Gynecology

## 2016-07-31 ENCOUNTER — Encounter: Payer: Commercial Managed Care - HMO | Admitting: Obstetrics and Gynecology

## 2016-07-31 NOTE — Progress Notes (Signed)
Patient did not keep 07/31/2016 GYN referral appointment.  Cornelia Copaharlie Ketsia Linebaugh, Jr MD Attending [facility](Faculty Practice)

## 2016-08-01 ENCOUNTER — Telehealth (HOSPITAL_COMMUNITY): Payer: Self-pay | Admitting: Emergency Medicine

## 2016-08-01 NOTE — Telephone Encounter (Signed)
-----   Message from Eustace MooreLaura W Murray, MD sent at 07/30/2016 12:09 PM EDT ----- Clinical staff, please let patient know that urine culture does not clearly demonstrate a UTI.   Recheck or followup with PCP/Duncan Oswaldo DoneVincent or GYN for further evaluation if symptoms of dysuria/vaginal irritation persist.  LM

## 2016-08-01 NOTE — Telephone Encounter (Signed)
Called pt and notified of recent lab results from visit Pt ID'd properly... Reports feeling better and sx have subsided and finished meds given at visit 9/5 Adv pt if sx are not getting better to return or to f/u w/PCP or GYN Pt verb understanding.

## 2016-08-14 ENCOUNTER — Ambulatory Visit (INDEPENDENT_AMBULATORY_CARE_PROVIDER_SITE_OTHER): Payer: Commercial Managed Care - HMO | Admitting: Internal Medicine

## 2016-08-14 VITALS — BP 175/81 | HR 95 | Temp 97.9°F | Ht 61.0 in | Wt 223.0 lb

## 2016-08-14 DIAGNOSIS — Z87891 Personal history of nicotine dependence: Secondary | ICD-10-CM | POA: Diagnosis not present

## 2016-08-14 DIAGNOSIS — J018 Other acute sinusitis: Secondary | ICD-10-CM | POA: Diagnosis not present

## 2016-08-14 DIAGNOSIS — J329 Chronic sinusitis, unspecified: Principal | ICD-10-CM

## 2016-08-14 DIAGNOSIS — F331 Major depressive disorder, recurrent, moderate: Secondary | ICD-10-CM | POA: Diagnosis not present

## 2016-08-14 DIAGNOSIS — B9689 Other specified bacterial agents as the cause of diseases classified elsewhere: Secondary | ICD-10-CM

## 2016-08-14 MED ORDER — DOXYCYCLINE HYCLATE 100 MG PO CAPS: 100.0000 mg | ORAL_CAPSULE | Freq: Two times a day (BID) | ORAL | 0 refills | Status: DC

## 2016-08-14 MED ORDER — PREDNISONE 20 MG PO TABS: 20.0000 mg | ORAL_TABLET | Freq: Every day | ORAL | 0 refills | Status: AC

## 2016-08-14 NOTE — Progress Notes (Signed)
CC: here for cough, congestion, headaches  HPI:  Mary Gray is a 55 y.o. woman with a past medical history listed below here today for compaint of cough, congestion, and headaches.  Patient reports symptoms began about 7 days ago.  She complains of headache, productive cough, nasal congestion, rhinorrhea, wheezing, sore ribs.  She was getting better about 3 days ago but then decompensated and so came to clinic today.  Her niece had similar symptoms and reports being given a prednisone burst and antibiotic.  Her cough is worse at night and she has attempted relief of symptoms with Flonase, Mucinex, Robitussin DM, and cough drops.  She has also been adherent to her triple inhaler therapy.  She reports fever (102 at home), chills, nausea, and ear pain.  She denies any diarrhea, abdominal pain, or urinary complaints..  For details of today's visit and the status of her chronic medical issues please refer to the assessment and plan.   Past Medical History:  Diagnosis Date  . Acne rosacea   . Allergic rhinitis   . Anxiety state, unspecified   . Atrophic vaginitis   . Atypical chest pain 12/11/2008   Normal stress nuclear study 01/12/2009 by Dr. Peter SwazilandJordan  . Bilateral leg edema   . Carpal tunnel syndrome   . Chronic interstitial cystitis    Followed by Dr. Marcine MatarStephen Dahlstedt  . COPD (chronic obstructive pulmonary disease) ([facility])   . DDD (degenerative disc disease), lumbar   . Degeneration of cervical intervertebral disc   . Depression   . Diabetes mellitus, type 2 ([facility])   . Dysrhythmia   . Elevated transaminase level    Mild elevation, AST=48, ALT=51 on 02/01/07.  Marland Kitchen. GERD (gastroesophageal reflux disease)   . Headache(784.0)   . Hematochezia   . Heme positive stool    EGD on 04/08/2010 by Dr. Ewing SchleinMagod showed chronic gastritis and a few gastric polyps; pathology showed a fundic gland polyp.  Colonoscopy on 04/08/2010 showed small external and internal hemorrhoids, and a few  benign-appearing diminutive polyps in the rectum, the distal sigmoid colon, and in the distal descending colon; pathology showed hyperplastic polyps.   . Hypercholesteremia   . Hypertension   . Hypothyroidism   . Iron deficiency anemia   . Irregular menses   . Left knee pain    Following fall 11/15/2009.  Marland Kitchen. Low back pain   . Obesity   . Pneumonia   . Shortness of breath   . Spinal stenosis   . Unspecified asthma(493.90)   . Vagina bleeding   . Viral warts, unspecified    Right hand.    Review of Systems:   Please see pertinent ROS reviewed in HPI and problem based charting.   Physical Exam:  Vitals:   08/14/16 1522  BP: (!) 175/81  Pulse: 95  Temp: 97.9 F (36.6 C)  TempSrc: Oral  SpO2: 94%  Weight: 223 lb (101.2 kg)  Height: 5\' 1"  (1.549 m)   Physical Exam  Constitutional: She is oriented to person, place, and time.  Fatigued appearing, no acute distress, but looks unwell.  HENT:  Head: Normocephalic and atraumatic.  Mouth/Throat: Oropharynx is clear and moist. No oropharyngeal exudate.  TM clear without erythema or bulging. She is tender over her maxillary sinuses.  Cardiovascular: Normal rate, regular rhythm and normal heart sounds.   Pulmonary/Chest: Effort normal. She has wheezes.  Her breath sounds are coarse and lung exam is interrupted by frequent coughing.  There is mild wheezing.  Neurological: She is alert and oriented to person, place, and time.  Psychiatric: Mood and affect normal.     Assessment & Plan:   See Encounters Tab for problem based charting.  Patient discussed with Dr. Cleda Daub .  Sinusitis, bacterial A: Roughly a week of symptoms with improvement and then decompensation.  Associated symptoms of productive cough, nasal congestion, and headaches.  Reported fever at home, although afebrile here, with some sinus tenderness on exam.  Given this constellation of symptoms, we will plan to treat as an bacterial sinusitis at this  point.  P: - doxycycline 100mg  BID for 5 days due to PCN allergy.  Although she has tetracyclines as an allergy on her medication list, it is noted that she took doxycycline in 2014 with no issue - will do a short prednisone burst for 5 days to help with the wheezing and inflammation - recommended continued symptomatic treatment as well with her Flonase.  I also recommmended frequent saline nasal irrigation and OTC antihistamine.  I recommended against an oral decongestant given her elevated blood pressure - continue inhalers - RTC 1 week if not improved

## 2016-08-14 NOTE — Patient Instructions (Signed)
Thank you for coming to see me today. It was a pleasure. Today we talked about:   Sinus infection: - we will treat you with a course of antibiotics with Doxycycline 100mg  twice per day. - also try using Claritin or Zyrtec (over the counter).  I would also recommend using frequent saline nasal irrigation for your congestion. - keep using your inhalers and flonase and other symptomatic treatments.   Please follow-up with us in 1 week if you are not better.  Please call us if you fail to improve.  If you have any questions or concerns, please do not hesitate to call the office at [phone].  Take Care,   Gwynn BurlyAndrew Kharson Rasmusson, DO

## 2016-08-14 NOTE — Progress Notes (Signed)
error 

## 2016-08-15 ENCOUNTER — Telehealth: Payer: Self-pay | Admitting: Pharmacist

## 2016-08-15 ENCOUNTER — Telehealth: Payer: Self-pay | Admitting: Student in an Organized Health Care Education/Training Program

## 2016-08-15 DIAGNOSIS — E1165 Type 2 diabetes mellitus with hyperglycemia: Principal | ICD-10-CM

## 2016-08-15 DIAGNOSIS — IMO0001 Reserved for inherently not codable concepts without codable children: Secondary | ICD-10-CM

## 2016-08-15 DIAGNOSIS — J069 Acute upper respiratory infection, unspecified: Secondary | ICD-10-CM | POA: Insufficient documentation

## 2016-08-15 MED ORDER — GLIPIZIDE 5 MG PO TABS: 5.0000 mg | ORAL_TABLET | Freq: Every day | ORAL | 0 refills | Status: DC

## 2016-08-15 NOTE — Telephone Encounter (Signed)
Steroid-Induced Hyperglycemia Prevention and Management Mary Gray is a 55 y.o. female who meets criteria for [facility] (diabetes patient prescribed short course of steroids).  A/P Current Regimen  Patient prescribed prednisone 20 mg daily x 5 days, currently on day 2 of therapy. Patient taking prednisone in the AM  Prednisone indication bacterial sinusitis  Current DM regimen metformin 1000 mg BID  Home BG Monitoring  Patient does have a meter at home and does check BG at home.  CBGs at home in 100s, 249 this morning within 30 minutes after eating a banana  S/Sx of hyper- or hypoglycemia: none reported  Medication Management  Additional treatment for BG control is indicated at this time. Glipizide 5 mg daily  Physician preference level per protocol: 1  Medication supply Rx needed for supply of glipizide  Patient Education  Advised patient to monitor BG while on steroid therapy (at least twice daily prior to first 2 meals of the day).  Patient educated about signs/symptoms and advised to contact clinic if hyper- or hypoglycemic.  Of note, patient had been apprehensive about taking doxycycline due to an adverse reaction in the past. Reaction was nausea/vomiting but this was with minocycline. Chart note indicates patient has been able to tolerate doxycycline in the past. Education provided to patient on administration of doxycycline (take with full glass of water and sit up for at least 30 minutes, eat with a snack such as toast/crackers to help with tolerability).  Patient verbalized understanding of information discussed by repeat back.  Follow-up daily  Next PCP appointment 09/18/16  Kim,Jennifer J 11:03 AM 08/15/2016

## 2016-08-15 NOTE — Telephone Encounter (Signed)
Patient would like a call back about her medications. 

## 2016-08-15 NOTE — Telephone Encounter (Signed)
This has been addressed per pt.

## 2016-08-15 NOTE — Assessment & Plan Note (Signed)
A: Roughly a week of symptoms with improvement and then decompensation.  Associated symptoms of productive cough, nasal congestion, and headaches.  Reported fever at home, although afebrile here, with some sinus tenderness on exam.  Given this constellation of symptoms, we will plan to treat as an bacterial sinusitis at this point.  P: - doxycycline 100mg  BID for 5 days due to PCN allergy.  Although she has tetracyclines as an allergy on her medication list, it is noted that she took doxycycline in 2014 with no issue - will do a short prednisone burst for 5 days to help with the wheezing and inflammation - recommended continued symptomatic treatment as well with her Flonase.  I also recommmended frequent saline nasal irrigation and OTC antihistamine.  I recommended against an oral decongestant given her elevated blood pressure - continue inhalers - RTC 1 week if not improved

## 2016-08-16 NOTE — Telephone Encounter (Signed)
Patient reports sinusitis symptoms are improving slightly, tolerating doxycycline, no signs/symptoms of hyper or hypoglycemia. She reports home BG have been around 145 (140 this morning), taking glipizide 5 mg daily, currently on day 3 of prednisone therapy.  No changes today, will call patient again tomorrow.

## 2016-08-16 NOTE — Progress Notes (Signed)
Internal Medicine Clinic Attending  Case discussed with Dr. Wallace at the time of the visit.  We reviewed the resident's history and exam and pertinent patient test results.  I agree with the assessment, diagnosis, and plan of care documented in the resident's note.  

## 2016-08-18 ENCOUNTER — Other Ambulatory Visit: Payer: Self-pay | Admitting: Internal Medicine

## 2016-08-18 ENCOUNTER — Ambulatory Visit: Payer: Commercial Managed Care - HMO | Admitting: Pharmacist

## 2016-08-18 ENCOUNTER — Telehealth: Payer: Self-pay | Admitting: *Deleted

## 2016-08-18 DIAGNOSIS — E1165 Type 2 diabetes mellitus with hyperglycemia: Principal | ICD-10-CM

## 2016-08-18 DIAGNOSIS — IMO0001 Reserved for inherently not codable concepts without codable children: Secondary | ICD-10-CM

## 2016-08-18 MED ORDER — GLIPIZIDE 5 MG PO TABS: 5.0000 mg | ORAL_TABLET | Freq: Every day | ORAL | 0 refills | Status: DC

## 2016-08-18 MED ORDER — BENZONATATE 100 MG PO CAPS: 100.0000 mg | ORAL_CAPSULE | Freq: Three times a day (TID) | ORAL | 0 refills | Status: DC | PRN

## 2016-08-18 MED FILL — glipiZIDE 5 MG TABS: 5 | 5 days supply | Qty: 5 | Fill #0

## 2016-08-18 NOTE — Telephone Encounter (Signed)
Need something called in an URI.

## 2016-08-18 NOTE — Telephone Encounter (Signed)
Pt calls and states she is constantly coughing States her temp yesterday was 101.2 today it is 99.1 States she has tried all the OTC cough remedies with no improvement Would like a prescription cough syrup Please advise Sending to dr's wallace and vincent

## 2016-08-18 NOTE — Telephone Encounter (Signed)
Called pt and informed her.

## 2016-08-18 NOTE — Progress Notes (Signed)
Steroid-Induced Hyperglycemia Prevention and Management Mary Gray is a 55 y.o. female who meets [facility] (diabetes patient prescribed short course of steroids).  A/P Current Regimen  Patient prescribed prednisone 20 mg daily x 5 days, currently on day 4 of therapy. Patient taking prednisone in the AM  Prednisone indication bacterial sinusitis  Current DM regimen metformin 1000 mg BID  Home BG Monitoring  Patient does have a meter at home and does check BG at home.  CBGs at home in 100s, 112 this morning before breakfast  S/Sx of hyper- or hypoglycemia: none reported  Medication Management  Additional treatment for BG control is not indicated at this time. Continue Glipizide 10 mg daily ? Physician preference level per protocol: 1 ? Medication supply Rx needed for supply of glipizide over the weekend  Patient Education  Advised patient to continue monitoring BG  Patient educated about signs/symptoms and advised to contact clinic if hyper- or hypoglycemic.  Last dose of prednisone scheduled for tomorrow. Patient advised to d/c glipizide after tomorrow. If BG > 200 on Sunday, can re-start glipizide 5 mg daily  Patient verbalized understanding of information discussed by repeat back.  Will follow up with patient on Monday 08/21/16

## 2016-08-18 NOTE — Telephone Encounter (Signed)
I will send in Tessalon pearls for her.

## 2016-08-22 NOTE — Telephone Encounter (Signed)
Spoke to patient 08/18/16. BG this AM 112 pre-breakfast. Advised patient to continue glipizide 5 mg daily, if BG > 200 over the weekend, can increase to 10 mg daily.  Patient follow up 08/22/16. Last dose of prednisone taken yesterday. Patient states she does not have BG meter with her but reports BG yesterday evening prior to bedtime as 190 (had not eaten before checking). BG this AM 190, pre-breakfast. Patient reports taking glipizide 5 mg daily. Advised patient to continue and will follow up daily to assess BG trend post prednisone.  Patient requests to be continued on glipizide. Notified patient we will consider all options if additional therapy is needed for DM post prednisone course. Will discuss with PCP.

## 2016-08-24 ENCOUNTER — Ambulatory Visit (HOSPITAL_COMMUNITY)
Admission: RE | Admit: 2016-08-24 | Discharge: 2016-08-24 | Disposition: A | Discharge status: Home / Self Care | Payer: Commercial Managed Care - HMO | Source: Ambulatory Visit | Attending: Internal Medicine | Admitting: Internal Medicine

## 2016-08-24 ENCOUNTER — Telehealth: Payer: Self-pay | Admitting: Pharmacist

## 2016-08-24 ENCOUNTER — Ambulatory Visit (INDEPENDENT_AMBULATORY_CARE_PROVIDER_SITE_OTHER): Payer: Commercial Managed Care - HMO | Admitting: Internal Medicine

## 2016-08-24 VITALS — BP 135/76 | HR 87 | Temp 98.0°F | Wt 219.1 lb

## 2016-08-24 DIAGNOSIS — Z87891 Personal history of nicotine dependence: Secondary | ICD-10-CM | POA: Diagnosis not present

## 2016-08-24 DIAGNOSIS — E1140 Type 2 diabetes mellitus with diabetic neuropathy, unspecified: Secondary | ICD-10-CM

## 2016-08-24 DIAGNOSIS — Z880 Allergy status to penicillin: Secondary | ICD-10-CM | POA: Diagnosis not present

## 2016-08-24 DIAGNOSIS — J069 Acute upper respiratory infection, unspecified: Secondary | ICD-10-CM

## 2016-08-24 DIAGNOSIS — R05 Cough: Secondary | ICD-10-CM

## 2016-08-24 DIAGNOSIS — E119 Type 2 diabetes mellitus without complications: Secondary | ICD-10-CM

## 2016-08-24 DIAGNOSIS — Z7984 Long term (current) use of oral hypoglycemic drugs: Secondary | ICD-10-CM | POA: Diagnosis not present

## 2016-08-24 DIAGNOSIS — J309 Allergic rhinitis, unspecified: Secondary | ICD-10-CM | POA: Diagnosis not present

## 2016-08-24 DIAGNOSIS — E1165 Type 2 diabetes mellitus with hyperglycemia: Secondary | ICD-10-CM

## 2016-08-24 MED ORDER — BENZONATATE 200 MG PO CAPS
200.0000 mg | ORAL_CAPSULE | Freq: Three times a day (TID) | ORAL | 0 refills | Status: DC | PRN
Start: 2016-08-24 — End: 2016-09-18

## 2016-08-24 MED ORDER — LEVOCETIRIZINE DIHYDROCHLORIDE 5 MG PO TABS: 5.0000 mg | ORAL_TABLET | Freq: Every evening | ORAL | 2 refills | Status: DC

## 2016-08-24 MED ORDER — GUAIFENESIN-CODEINE 100-10 MG/5ML PO SYRP: 5.0000 mL | ORAL_SOLUTION | Freq: Three times a day (TID) | ORAL | 0 refills | Status: DC | PRN

## 2016-08-24 NOTE — Progress Notes (Signed)
[facility] Subjective:  HPI: [name] is a 55 y.o. female with a PMH detailed below who presents for follow up of cough and nasal congestion.  She was seen on 9/25 by Dr Earlene Plater with complaints of cough, nasal congestion, and headaches with report of fevers at home.  She has treated empirically for a bacterial sinusitis with doxycycline (PCN allergy) and prednisone.  She does not feel that the doxycycline helped but reported some improvement in her symptoms while on the prednisone.  She has now completed the prednisone and feels her symptoms are back.  Her cough is mildly productive of a yellow/clear sputum.  Her headaches are mild.  She has nasal congestion.  She complains of itchy eyes.  She has not had further fevers.  She is not SOB.  She is using flonase chronically.  She does not feel that mucinex or robitussin helps.  Tessalon pearls help some. She has tried claritin and zyrtec in the past and has not helped.     Review of Systems: Per HPI Objective:  Physical Exam: Vitals:   08/24/16 1604  BP: 135/76  Pulse: 87  Temp: 98 F (36.7 C)  TempSrc: Oral  SpO2: 98%  Weight: 219 lb 1.6 oz (99.4 kg)  Physical Exam  Constitutional: She is well-developed, well-nourished, and in no distress.  HENT:  Mouth/Throat: Oropharynx is clear and moist. No oropharyngeal exudate.  Inferior nasal turbinates swollen  Eyes: Conjunctivae are normal.  Cardiovascular: Normal rate and regular rhythm.   Pulmonary/Chest: Effort normal and breath sounds normal. No respiratory distress. She has no wheezes.  Musculoskeletal: She exhibits no edema.  Nursing note and vitals reviewed.   Assessment & Plan:  URI with cough and congestion A: URI with cough and congestion with llergic rhinnitis  P: Will obtain CXR>>> no infiltrate Refill Tessalon pearls will increase to 200mg  Will start antihistamine (previously did not respond to claritin or xyrtec) Xyzal 5mg  QHS Short  Course of Robitussin AC for cough. Follow up in 2 weeks if not better otherwise keep appointment with PCP.  Diabetes mellitus type 2, uncontrolled (HCC) HPI: Was hyperglycemic with prednisone, started on glipizide, now off prednisone and glipizide discontinued.  AM blood sugars per patient 100-120. A: Type 2 DM uncontrolled  P: Continue Metformin 1000mg  BID    Medications Ordered Meds ordered this encounter  Medications  . levocetirizine (XYZAL) 5 MG tablet    Sig: Take 1 tablet (5 mg total) by mouth every evening.    Dispense:  30 tablet    Refill:  2  . guaiFENesin-codeine (ROBITUSSIN AC) 100-10 MG/5ML syrup    Sig: Take 5 mLs by mouth 3 (three) times daily as needed for cough.    Dispense:  120 mL    Refill:  0  . benzonatate (TESSALON) 200 MG capsule    Sig: Take 1 capsule (200 mg total) by mouth 3 (three) times daily as needed for cough.    Dispense:  30 capsule    Refill:  0   Other Orders Orders Placed This Encounter  Procedures  . DG Chest 2 View    Standing Status:   Future    Number of Occurrences:   1    Standing Expiration Date:   10/24/2017    Order Specific Question:   Reason for Exam (SYMPTOM  OR DIAGNOSIS REQUIRED)    Answer:   cough and URI symptoms for 6 weeks.    Order Specific Question:   Is patient  pregnant?    Answer:   No    Order Specific Question:   [facility]    [facility] [place]   Follow Up: Return if symptoms worsen or fail to improve.

## 2016-08-24 NOTE — Patient Instructions (Signed)
I want you to get a chest x-ray.  I want you to try the cough syrup to see if that helps your cough.  Try Xyzal to see if that helps any allergy symptoms you may be having.

## 2016-08-24 NOTE — Telephone Encounter (Signed)
Unable to reach patient x 2. Prednisone course completed, glipizide discontinued. Signing off case.

## 2016-08-24 NOTE — Telephone Encounter (Addendum)
Patient called to report BG since prednisone completion. Currently home BG 120-130s. No signs/symptoms of hyper or hypoglycemia

## 2016-08-25 NOTE — Assessment & Plan Note (Signed)
HPI: Was hyperglycemic with prednisone, started on glipizide, now off prednisone and glipizide discontinued.  AM blood sugars per patient 100-120. A: Type 2 DM uncontrolled  P: Continue Metformin 1000mg  BID

## 2016-08-25 NOTE — Assessment & Plan Note (Signed)
A: URI with cough and congestion with llergic rhinnitis  P: Will obtain CXR>>> no infiltrate Refill Tessalon pearls will increase to 200mg  Will start antihistamine (previously did not respond to claritin or xyrtec) Xyzal 5mg  QHS Short Course of Robitussin AC for cough. Follow up in 2 weeks if not better otherwise keep appointment with PCP.

## 2016-09-01 ENCOUNTER — Other Ambulatory Visit: Payer: Self-pay | Admitting: *Deleted

## 2016-09-04 NOTE — Telephone Encounter (Signed)
I am going to decline a refill of this codeine based cough syrup; too much risk of medication interactions given hydrocodone, alprazolam, and ambien. She was seen by Mikey BussingHoffman on 10/5 for URI. Should be seeing improvement by now, will need re-evaluation if not. I have an appt with her on 10/30.

## 2016-09-15 ENCOUNTER — Telehealth: Payer: Self-pay | Admitting: Student in an Organized Health Care Education/Training Program

## 2016-09-15 NOTE — Telephone Encounter (Signed)
APT. REMINDER CALL, LMTCB °

## 2016-09-18 ENCOUNTER — Encounter: Payer: Self-pay | Admitting: Student in an Organized Health Care Education/Training Program

## 2016-09-18 ENCOUNTER — Ambulatory Visit (INDEPENDENT_AMBULATORY_CARE_PROVIDER_SITE_OTHER): Payer: Commercial Managed Care - HMO | Admitting: Student in an Organized Health Care Education/Training Program

## 2016-09-18 VITALS — BP 130/82 | HR 90 | Temp 98.0°F | Ht 61.0 in | Wt 223.7 lb

## 2016-09-18 DIAGNOSIS — Z7984 Long term (current) use of oral hypoglycemic drugs: Secondary | ICD-10-CM | POA: Diagnosis not present

## 2016-09-18 DIAGNOSIS — M5136 Other intervertebral disc degeneration, lumbar region: Secondary | ICD-10-CM

## 2016-09-18 DIAGNOSIS — I10 Essential (primary) hypertension: Secondary | ICD-10-CM

## 2016-09-18 DIAGNOSIS — G8929 Other chronic pain: Secondary | ICD-10-CM

## 2016-09-18 DIAGNOSIS — F1920 Other psychoactive substance dependence, uncomplicated: Secondary | ICD-10-CM

## 2016-09-18 DIAGNOSIS — Z79891 Long term (current) use of opiate analgesic: Secondary | ICD-10-CM

## 2016-09-18 DIAGNOSIS — Z79899 Other long term (current) drug therapy: Secondary | ICD-10-CM

## 2016-09-18 DIAGNOSIS — F411 Generalized anxiety disorder: Secondary | ICD-10-CM

## 2016-09-18 DIAGNOSIS — B372 Candidiasis of skin and nail: Secondary | ICD-10-CM

## 2016-09-18 DIAGNOSIS — E034 Atrophy of thyroid (acquired): Secondary | ICD-10-CM | POA: Diagnosis not present

## 2016-09-18 DIAGNOSIS — E1140 Type 2 diabetes mellitus with diabetic neuropathy, unspecified: Secondary | ICD-10-CM

## 2016-09-18 DIAGNOSIS — R339 Retention of urine, unspecified: Secondary | ICD-10-CM | POA: Diagnosis not present

## 2016-09-18 DIAGNOSIS — G4700 Insomnia, unspecified: Secondary | ICD-10-CM

## 2016-09-18 DIAGNOSIS — M48061 Spinal stenosis, lumbar region without neurogenic claudication: Secondary | ICD-10-CM

## 2016-09-18 DIAGNOSIS — Z87891 Personal history of nicotine dependence: Secondary | ICD-10-CM

## 2016-09-18 DIAGNOSIS — E1165 Type 2 diabetes mellitus with hyperglycemia: Secondary | ICD-10-CM | POA: Diagnosis not present

## 2016-09-18 LAB — GLUCOSE, CAPILLARY: Glucose-Capillary: 212 mg/dL — ABNORMAL HIGH (ref 65–99)

## 2016-09-18 LAB — POCT GLYCOSYLATED HEMOGLOBIN (HGB A1C): Hemoglobin A1C: 7.3

## 2016-09-18 MED ORDER — HYDROCODONE-ACETAMINOPHEN 7.5-325 MG PO TABS: 1.0000 | ORAL_TABLET | Freq: Three times a day (TID) | ORAL | 0 refills | Status: DC | PRN

## 2016-09-18 MED ORDER — NYSTATIN 100000 UNIT/GM EX POWD: Freq: Four times a day (QID) | CUTANEOUS | 0 refills | Status: DC

## 2016-09-18 NOTE — Assessment & Plan Note (Signed)
Chronic pain generator is degenerative lumbar spinal stenosis with degenerative disc disease and facet disease. She follows with neurosurgery, has a pending myelogram to determine she is a candidate for advanced surgical therapies. Currently on medical therapy with hydrocodone 7.5 mg 3 times a day. Functionally doing fairly well, uses a walker to ambulate. No adverse side effects of chronic opiate therapy so far. Urine tox screen checked in July was appropriate. We changed from Dilaudid to hydrocodone, I'm going to recheck another tox screen today. I looked at the prescribers database which shows appropriate dispensing over the last 3 months. I provided her with 3 month supply of hydrocodone and she should follow up with me in clinic for future refills.

## 2016-09-18 NOTE — Progress Notes (Signed)
Assessment and Plan:  See Encounters tab for problem-based medical decision making.   __________________________________________________________  HPI:  55 year old woman who is here for follow-up of chronic back pain. Patient has significant degenerative lumbar disc disease, spinal stenosis, and facet disease. I manage her pain and functional status with chronic narcotics, 3 months ago we transitioned from the lauded to hydrocodone. She feels that her medications are helping her to walk around and complete her activities of daily living. She reports having one fall since I last saw her, says that she was tripped by her little dog. Denies any other falls, no other adverse side effects, no other confusion. She reports good compliance with her other medications. She had an upper respiratory infection that was managed conservatively. Eating and drinking well. Lives with her sister. They lost their transportation, their Zenaida Niecevan broke down, they're waiting for her to get repaired. Denies any recent smoking.  __________________________________________________________  Problem List: Patient Active Problem List   Diagnosis Date Noted  . Chronic pain with drug dependence (HCC) 02/08/2016    Priority: High  . Spinal stenosis of lumbar region 03/01/2011    Priority: High  . Diabetes mellitus type 2, uncontrolled (HCC) 07/06/2006    Priority: High  . Generalized anxiety disorder 07/06/2006    Priority: High  . AVN (avascular necrosis of bone) (HCC) 03/07/2013    Priority: Medium  . Hypothyroidism 07/06/2006    Priority: Medium  . Essential hypertension 07/06/2006    Priority: Medium  . Candidal skin infection 03/03/2014    Priority: Low  . Preventative health care 05/14/2013    Priority: Low  . Hyperlipemia 07/06/2006    Priority: Low  . GERD 07/06/2006    Priority: Low    Medications: Reconciled today in Epic __________________________________________________________  Physical Exam:  Vital  Signs: Vitals:   09/18/16 1037  BP: (!) 167/81  Pulse: 90  Temp: 98 F (36.7 C)  TempSrc: Oral  SpO2: 97%  Weight: 223 lb 11.2 oz (101.5 kg)  Height: 5\' 1"  (1.549 m)    Gen: chronically ill appearing, using a rolling walker. CV: RRR, no murmurs Pulm: Normal effort, CTA throughout, no wheezing Abd: Soft, NT, ND, normal BS.  Ext: Warm, no edema, normal joints Skin: No atypical appearing moles. No rashes

## 2016-09-18 NOTE — Assessment & Plan Note (Signed)
Symptomatically stable. She follows with Dr. Ezzard [facility]ol Sena [facility] who prescribes fluoxetine and alprazolam for her. Also prescribes zolpidem for insomnia component. I do worry about polypharmacy in this case, so we are monitoring her closely.

## 2016-09-18 NOTE — Assessment & Plan Note (Signed)
Blood pressure initially elevated but on recheck was 130/82. Doing well on medicines with no side effect. Plan to continue enalapril and we have the ability to increase the dose in the future if needed.

## 2016-09-18 NOTE — Assessment & Plan Note (Signed)
Recurrent intertrigo candidiasis. Plan is to prescribe nystatin powder to be used once daily for several weeks until it clears.

## 2016-09-18 NOTE — Patient Instructions (Signed)
1. Take your medicines as prescribed.   2. Follow up for the test on your back and with your spine surgeon.   3. Call me if you experience any more falls at home

## 2016-09-18 NOTE — Assessment & Plan Note (Addendum)
A1c 7.3% today. Doing well on metformin only. Plan to continue metformin 1000 mg twice a day. I advised her to stop home glucose monitoring, I think quarterly A1c checks will be adequate. Continuing low-dose pravastatin for primary prevention of CVD.

## 2016-09-19 ENCOUNTER — Encounter: Payer: Self-pay | Admitting: Student in an Organized Health Care Education/Training Program

## 2016-09-19 LAB — TSH: TSH: 4.13 u[IU]/mL (ref 0.450–4.500)

## 2016-09-26 ENCOUNTER — Other Ambulatory Visit: Payer: Self-pay | Admitting: *Deleted

## 2016-09-26 LAB — TOXASSURE SELECT,+ANTIDEPR,UR

## 2016-09-26 MED ORDER — METFORMIN HCL ER 500 MG PO TB24: 1000.0000 mg | ORAL_TABLET | Freq: Two times a day (BID) | ORAL | 3 refills | Status: DC

## 2016-09-26 MED ORDER — LEVOCETIRIZINE DIHYDROCHLORIDE 5 MG PO TABS: 5.0000 mg | ORAL_TABLET | Freq: Every evening | ORAL | 3 refills | Status: DC

## 2016-09-26 NOTE — Telephone Encounter (Signed)
Requesting 90 days supplies. Thanks

## 2016-10-04 DIAGNOSIS — F331 Major depressive disorder, recurrent, moderate: Secondary | ICD-10-CM | POA: Diagnosis not present

## 2016-10-08 ENCOUNTER — Other Ambulatory Visit: Payer: Self-pay | Admitting: Student in an Organized Health Care Education/Training Program

## 2016-10-26 ENCOUNTER — Other Ambulatory Visit: Payer: Self-pay | Admitting: Internal Medicine

## 2016-10-26 DIAGNOSIS — I10 Essential (primary) hypertension: Secondary | ICD-10-CM

## 2016-10-27 ENCOUNTER — Other Ambulatory Visit: Payer: Self-pay | Admitting: *Deleted

## 2016-10-27 MED ORDER — GLUCOSE BLOOD VI STRP: ORAL_STRIP | 1 refills | Status: DC

## 2016-10-27 NOTE — Telephone Encounter (Signed)
Patient requesting refill for glucose test strips, informed Dr. Oswaldo DoneVincent with order to refill to patient's requested pharmacy.

## 2016-10-30 ENCOUNTER — Telehealth: Payer: Self-pay | Admitting: *Deleted

## 2016-10-30 NOTE — Telephone Encounter (Signed)
SPOKE WITH PATIENT REGARDING HER EYE REFERRAL WITH DR GROAT. PATIENT STATES SHE CALL THE OFFICE AND THEY TOLD HER TO CALL BACK FOR APPOINTMENT WHEN HER SORE THROAT HAS CLEARED UP.

## 2016-12-05 ENCOUNTER — Other Ambulatory Visit: Payer: Self-pay | Admitting: Internal Medicine

## 2016-12-05 DIAGNOSIS — I10 Essential (primary) hypertension: Secondary | ICD-10-CM

## 2016-12-15 ENCOUNTER — Other Ambulatory Visit: Payer: Self-pay | Admitting: Student in an Organized Health Care Education/Training Program

## 2016-12-15 DIAGNOSIS — M48061 Spinal stenosis, lumbar region without neurogenic claudication: Secondary | ICD-10-CM

## 2016-12-15 NOTE — Telephone Encounter (Signed)
Patient with chronic pain. Is supposed to see me in office every three months for narcotic refill. I am not sure why she made an appointment for 1 month after refill runs out. Last fill of hydrocodone was 12/27, unfortunately just asked us for a refill today, clinic is closing, and this cannot be called in. I will provide a one month refill on Monday that can be picked up, she can hopefully make due until then.

## 2016-12-15 NOTE — Telephone Encounter (Signed)
Requesting a Refill  HYDROcodone-acetaminophen (NORCO) 7.5-325 MG

## 2016-12-15 NOTE — Telephone Encounter (Signed)
Rx last written 09/18/16 x 3 rxs. Las t appt 09/18/16. F/U appt scheduled 01/15/17. UDS 09/18/16. Med contract 05/2012.

## 2016-12-18 MED ORDER — HYDROCODONE-ACETAMINOPHEN 7.5-325 MG PO TABS: 1.0000 | ORAL_TABLET | Freq: Three times a day (TID) | ORAL | 0 refills | Status: DC | PRN

## 2016-12-18 NOTE — Telephone Encounter (Signed)
One month supply approved. I printed the Rx and left it with Triage for the patient to pick up.

## 2016-12-18 NOTE — Telephone Encounter (Signed)
Rx ready-per pt, sister will pick up

## 2016-12-25 ENCOUNTER — Other Ambulatory Visit: Payer: Self-pay | Admitting: Student in an Organized Health Care Education/Training Program

## 2017-01-01 DIAGNOSIS — F331 Major depressive disorder, recurrent, moderate: Secondary | ICD-10-CM | POA: Diagnosis not present

## 2017-01-12 ENCOUNTER — Telehealth: Payer: Self-pay | Admitting: Student in an Organized Health Care Education/Training Program

## 2017-01-12 NOTE — Telephone Encounter (Signed)
APT. REMINDER CALL, LMTCB °

## 2017-01-15 ENCOUNTER — Ambulatory Visit (INDEPENDENT_AMBULATORY_CARE_PROVIDER_SITE_OTHER): Payer: Medicare HMO | Admitting: Student in an Organized Health Care Education/Training Program

## 2017-01-15 ENCOUNTER — Encounter (INDEPENDENT_AMBULATORY_CARE_PROVIDER_SITE_OTHER): Payer: Self-pay

## 2017-01-15 ENCOUNTER — Encounter: Payer: Self-pay | Admitting: Student in an Organized Health Care Education/Training Program

## 2017-01-15 VITALS — BP 130/70 | HR 81 | Temp 98.2°F | Ht 61.0 in | Wt 226.3 lb

## 2017-01-15 DIAGNOSIS — M5136 Other intervertebral disc degeneration, lumbar region: Secondary | ICD-10-CM | POA: Diagnosis not present

## 2017-01-15 DIAGNOSIS — M48061 Spinal stenosis, lumbar region without neurogenic claudication: Secondary | ICD-10-CM

## 2017-01-15 DIAGNOSIS — Z7984 Long term (current) use of oral hypoglycemic drugs: Secondary | ICD-10-CM

## 2017-01-15 DIAGNOSIS — Z87891 Personal history of nicotine dependence: Secondary | ICD-10-CM

## 2017-01-15 DIAGNOSIS — E119 Type 2 diabetes mellitus without complications: Secondary | ICD-10-CM | POA: Diagnosis not present

## 2017-01-15 DIAGNOSIS — Z23 Encounter for immunization: Secondary | ICD-10-CM | POA: Diagnosis not present

## 2017-01-15 DIAGNOSIS — E1165 Type 2 diabetes mellitus with hyperglycemia: Principal | ICD-10-CM

## 2017-01-15 DIAGNOSIS — Z79899 Other long term (current) drug therapy: Secondary | ICD-10-CM

## 2017-01-15 DIAGNOSIS — K5903 Drug induced constipation: Secondary | ICD-10-CM | POA: Diagnosis not present

## 2017-01-15 DIAGNOSIS — G8929 Other chronic pain: Secondary | ICD-10-CM

## 2017-01-15 DIAGNOSIS — I10 Essential (primary) hypertension: Secondary | ICD-10-CM | POA: Diagnosis not present

## 2017-01-15 DIAGNOSIS — T402X5D Adverse effect of other opioids, subsequent encounter: Secondary | ICD-10-CM

## 2017-01-15 DIAGNOSIS — F1920 Other psychoactive substance dependence, uncomplicated: Secondary | ICD-10-CM

## 2017-01-15 DIAGNOSIS — E1140 Type 2 diabetes mellitus with diabetic neuropathy, unspecified: Secondary | ICD-10-CM

## 2017-01-15 DIAGNOSIS — Z79891 Long term (current) use of opiate analgesic: Secondary | ICD-10-CM

## 2017-01-15 LAB — GLUCOSE, CAPILLARY: Glucose-Capillary: 223 mg/dL — ABNORMAL HIGH (ref 65–99)

## 2017-01-15 LAB — POCT GLYCOSYLATED HEMOGLOBIN (HGB A1C): Hemoglobin A1C: 6.8

## 2017-01-15 MED ORDER — HYDROCODONE-ACETAMINOPHEN 7.5-325 MG PO TABS: 1.0000 | ORAL_TABLET | Freq: Three times a day (TID) | ORAL | 0 refills | Status: DC | PRN

## 2017-01-15 MED ORDER — LACTULOSE 20 G PO PACK: 20.0000 g | PACK | Freq: Every day | ORAL | 5 refills | Status: DC | PRN

## 2017-01-15 NOTE — Assessment & Plan Note (Signed)
The pressure initially elevated on arrival at 142/81. On recheck after she was sitting in the room for 5 minutes it was 130/70. Plan to continue enalapril 2.5 mg daily.

## 2017-01-15 NOTE — Progress Notes (Signed)
   Assessment and Plan:  See Encounters tab for problem-based medical decision making.   __________________________________________________________  HPI:  56 year old woman here for follow-up of chronic lower back pain. This is due to spinal stenosis with degenerative disc disease and facet disease. She follows with neurosurgery but is not a very good candidate for operative intervention. She is currently by her sister today. Reports doing relatively well at home. Her mother passed away last October and she is still more depressed than usual because of this. Reports that her pain has been stable. She is finding good benefit from the current pain regimen. She is able to complete all of her activities of daily living. She uses a rolling walker to ambulate outside of the house. She is reporting constipation, in the past has used lactulose. His found little benefit with other medications like senna. Denies any recent illnesses. No fevers or chills. Eating and drinking well. There difficulty with transportation and with food access.  __________________________________________________________  Problem List: Patient Active Problem List   Diagnosis Date Noted  . Chronic pain with drug dependence (HCC) 02/08/2016    Priority: High  . Spinal stenosis of lumbar region 03/01/2011    Priority: High  . Diabetes mellitus type 2, uncontrolled (HCC) 07/06/2006    Priority: High  . Generalized anxiety disorder 07/06/2006    Priority: High  . AVN (avascular necrosis of bone) (HCC) 03/07/2013    Priority: Medium  . Hypothyroidism 07/06/2006    Priority: Medium  . Essential hypertension 07/06/2006    Priority: Medium  . Candidal skin infection 03/03/2014    Priority: Low  . Preventative health care 05/14/2013    Priority: Low  . Hyperlipemia 07/06/2006    Priority: Low  . GERD 07/06/2006    Priority: Low    Medications: Reconciled today in  Epic __________________________________________________________  Physical Exam:  Vital Signs: Vitals:   01/15/17 0956  BP: (!) 146/81  Pulse: 81  Temp: 98.2 F (36.8 C)  TempSrc: Oral  Weight: 226 lb 4.8 oz (102.6 kg)  Height: 5\' 1"  (1.549 m)    Gen: Chronically ill-appearing woman CV: RRR, no murmurs Pulm: Normal effort, CTA throughout, no wheezing Abd: Soft, NT, ND, normal BS.  Ext: Warm, trace bilateral edema, normal joints Skin: No atypical appearing moles. No rashes

## 2017-01-15 NOTE — Assessment & Plan Note (Signed)
Hemoglobin A1c is 6.8% which is at goal. Plan to continue metformin next saw her 1000 mg twice a day. No indication for home glucose monitoring. Continue enalapril for her very mild hypertension and pravastatin for primary prevention of ischemic vascular disease.

## 2017-01-15 NOTE — Patient Instructions (Addendum)
1. I have refilled your pain medications for a three month supply. Please see me in a visit for your refills.   2. Take Lactulose as needed to have one bowel movement daily.

## 2017-01-15 NOTE — Assessment & Plan Note (Addendum)
Chronic pain generator is degenerative lumbar spinal stenosis with degenerative disc disease and facet disease. We are medically managing her with hydrocodone which she reports is been helpful in improving her functional status. I reviewed the controlled substance database today which showed appropriate dispensing. Urine tox screen in October was appropriate. I gave her a 3 month refill of hydrocodone 7.5 mg #90 to be used 3 times a day. She should follow-up with me in a visit for future refills. She is having constipation as a side effect, I have refilled lactulose which she has found good success with in the past.

## 2017-01-22 ENCOUNTER — Other Ambulatory Visit: Payer: Self-pay

## 2017-01-22 DIAGNOSIS — F1920 Other psychoactive substance dependence, uncomplicated: Secondary | ICD-10-CM

## 2017-01-22 DIAGNOSIS — B372 Candidiasis of skin and nail: Secondary | ICD-10-CM

## 2017-01-22 DIAGNOSIS — G8929 Other chronic pain: Secondary | ICD-10-CM

## 2017-01-22 MED ORDER — LACTULOSE 20 G PO PACK: 20.0000 g | PACK | Freq: Every day | ORAL | 5 refills | Status: DC | PRN

## 2017-01-22 MED ORDER — NYSTATIN 100000 UNIT/GM EX POWD: Freq: Four times a day (QID) | CUTANEOUS | 0 refills | Status: DC

## 2017-01-26 ENCOUNTER — Telehealth: Payer: Self-pay

## 2017-01-26 NOTE — Telephone Encounter (Signed)
lactulose (KRISTALOSE) 20 g packet, nystatin (NYSTATIN) powder, refill request @ [facility] on [address].

## 2017-01-26 NOTE — Telephone Encounter (Signed)
Lactulose and Nystatin powder were sent to [facility]. Called pt - stated [facility] is for her diabetic supplies only.  I called rxs to [facility] on [address] per pt's request.

## 2017-01-26 NOTE — Telephone Encounter (Signed)
Kristie from [facility] needs to speak with a nurse.

## 2017-01-29 ENCOUNTER — Other Ambulatory Visit: Payer: Self-pay | Admitting: *Deleted

## 2017-01-29 NOTE — Telephone Encounter (Signed)
rc to [facility], changed lactulose to liquid 15 ml daily, month worth w/ 5 additional refills per conversation w/ dr Oswaldo Donevincent

## 2017-02-08 ENCOUNTER — Encounter: Payer: Self-pay | Admitting: Student in an Organized Health Care Education/Training Program

## 2017-02-12 ENCOUNTER — Other Ambulatory Visit: Payer: Self-pay | Admitting: Student in an Organized Health Care Education/Training Program

## 2017-02-26 ENCOUNTER — Ambulatory Visit (INDEPENDENT_AMBULATORY_CARE_PROVIDER_SITE_OTHER): Payer: Medicare HMO | Admitting: Internal Medicine

## 2017-02-26 ENCOUNTER — Encounter (INDEPENDENT_AMBULATORY_CARE_PROVIDER_SITE_OTHER): Payer: Self-pay

## 2017-02-26 ENCOUNTER — Encounter: Payer: Self-pay | Admitting: Internal Medicine

## 2017-02-26 DIAGNOSIS — E119 Type 2 diabetes mellitus without complications: Secondary | ICD-10-CM

## 2017-02-26 DIAGNOSIS — L539 Erythematous condition, unspecified: Secondary | ICD-10-CM | POA: Diagnosis not present

## 2017-02-26 DIAGNOSIS — M7989 Other specified soft tissue disorders: Secondary | ICD-10-CM

## 2017-02-26 DIAGNOSIS — I872 Venous insufficiency (chronic) (peripheral): Secondary | ICD-10-CM

## 2017-02-26 NOTE — Assessment & Plan Note (Addendum)
Her bilateral leg swelling with very mild erythema is most likely due to chronic venous insufficiency. She was advised to pick up compression stockings and to elevate her legs as much as possible. She did not have any skin breakdown on exam, but this issue will need to be followed closely as she is diabetic and at risk for infection. We discussed that chronic venous insufficiency if not treated with compression stockings can lead to venous dermatitis and the consequences of that. She was advised that she does not need Lasix for this issue.

## 2017-02-26 NOTE — Progress Notes (Signed)
   CC: Leg swelling, pain  HPI:  [name] is a 56 y.o. woman with past medical history as noted below who presents today for evaluation of bilateral leg swelling and pain.  She reports a two-week history of increased leg swelling and pain in both legs. She describes the pain as achy and throbbing. She states it is very uncomfortable to walk. She reports her legs became very red, but this has improved some. She notes she used to be on Lasix for leg swelling but this was recently discontinued. She denies any history of heart failure. She reports she used to wear compression stockings but has not done so recently. She denies any sores on her legs.  Past Medical History:  Diagnosis Date  . Acne rosacea   . Allergic rhinitis   . Anxiety state, unspecified   . Atrophic vaginitis   . Atypical chest pain 12/11/2008   Normal stress nuclear study 01/12/2009 by Dr. Peter [place]  . Bilateral leg edema   . Carpal tunnel syndrome   . Chronic interstitial cystitis    Followed by Dr. Marcine Matar  . COPD (chronic obstructive pulmonary disease) (HCC)   . DDD (degenerative disc disease), lumbar   . Degeneration of cervical intervertebral disc   . Depression   . Diabetes mellitus, type 2 (HCC)   . Dysrhythmia   . Elevated transaminase level    Mild elevation, AST=48, ALT=51 on 02/01/07.  Marland Kitchen GERD (gastroesophageal reflux disease)   . Headache(784.0)   . Hematochezia   . Heme positive stool    EGD on 04/08/2010 by Dr. Ewing Schlein showed chronic gastritis and a few gastric polyps; pathology showed a fundic gland polyp.  Colonoscopy on 04/08/2010 showed small external and internal hemorrhoids, and a few benign-appearing diminutive polyps in the rectum, the distal sigmoid colon, and in the distal descending colon; pathology showed hyperplastic polyps.   . Hypercholesteremia   . Hypertension   . Hypothyroidism   . Iron deficiency anemia   . Irregular menses   . Left knee pain    Following fall  11/15/2009.  Marland Kitchen Low back pain   . Obesity   . Pneumonia   . Shortness of breath   . Spinal stenosis   . Unspecified asthma(493.90)   . Vagina bleeding   . Viral warts, unspecified    Right hand.    Review of Systems:   General: Denies fever, chills, night sweats, changes in weight, changes in appetite HEENT: Denies headaches, ear pain, changes in vision, rhinorrhea, sore throat CV: Denies CP, palpitations, SOB, orthopnea Pulm: Denies SOB, cough, wheezing GI: Denies abdominal pain, nausea, vomiting, diarrhea, constipation, melena, hematochezia GU: Denies dysuria, hematuria, frequency Msk: See HPI Neuro: Denies weakness, numbness, tingling Skin: Denies bruising Psych: Denies depression, anxiety, hallucinations  Physical Exam:  Vitals:   02/26/17 1456  BP: (!) 142/63  Pulse: 92  Temp: 98.5 F (36.9 C)  TempSrc: Oral  SpO2: 97%  Weight: 225 lb 14.4 oz (102.5 kg)  Height:  (1.549 m)   General: Well-nourished woman, NAD Ext: Bilateral leg swelling present (1+) with mild erythema. No evidence of chronic venous skin changes.   Assessment & Plan:   See Encounters Tab for problem based charting.  Patient discussed with Dr. Josem Kaufmann

## 2017-02-26 NOTE — Patient Instructions (Signed)
General Instructions: - You have venous insufficiency - Please pick up some compression stockings and keep your feet propped up as much as possible. - It would be good for you to walk as well  Please bring your medicines with you each time you come to clinic.  Medicines may include prescription medications, over-the-counter medications, herbal remedies, eye drops, vitamins, or other pills.   Progress Toward Treatment Goals:  Treatment Goal 02/09/2015  Hemoglobin A1C at goal  Blood pressure at goal  Prevent falls -    Self Care Goals & Plans:  Self Care Goal 04/14/2016  Manage my medications take my medicines as prescribed; bring my medications to every visit  Monitor my health keep track of my blood glucose; bring my glucose meter and log to each visit; keep track of my blood pressure  Eat healthy foods drink diet soda or water instead of juice or soda; eat more vegetables; eat foods that are low in salt; eat baked foods instead of fried foods; eat fruit for snacks and desserts  Be physically active find an activity I enjoy  Prevent falls use home fall prevention checklist to improve safety  Meeting treatment goals maintain the current self-care plan    Home Blood Glucose Monitoring 02/09/2015  Check my blood sugar once a day  When to check my blood sugar before breakfast     Care Management & Community Referrals:  Referral 02/09/2015  Referrals made for care management support none needed  Referrals made to community resources none

## 2017-02-27 ENCOUNTER — Encounter: Payer: Self-pay | Admitting: Student in an Organized Health Care Education/Training Program

## 2017-02-27 NOTE — Progress Notes (Signed)
Case discussed with Dr. Rivet soon after the resident saw the patient.  We reviewed the resident's history and exam and pertinent patient test results.  I agree with the assessment, diagnosis and plan of care documented in the resident's note. 

## 2017-03-14 ENCOUNTER — Other Ambulatory Visit: Payer: Self-pay | Admitting: Student in an Organized Health Care Education/Training Program

## 2017-03-29 DIAGNOSIS — F331 Major depressive disorder, recurrent, moderate: Secondary | ICD-10-CM | POA: Diagnosis not present

## 2017-04-06 ENCOUNTER — Telehealth: Payer: Self-pay | Admitting: Student in an Organized Health Care Education/Training Program

## 2017-04-06 NOTE — Telephone Encounter (Signed)
APT. REMINDER CALL, LMTCB °

## 2017-04-09 ENCOUNTER — Ambulatory Visit (INDEPENDENT_AMBULATORY_CARE_PROVIDER_SITE_OTHER): Payer: Medicare HMO | Admitting: Student in an Organized Health Care Education/Training Program

## 2017-04-09 ENCOUNTER — Encounter: Payer: Self-pay | Admitting: Student in an Organized Health Care Education/Training Program

## 2017-04-09 ENCOUNTER — Encounter (INDEPENDENT_AMBULATORY_CARE_PROVIDER_SITE_OTHER): Payer: Self-pay

## 2017-04-09 VITALS — BP 163/80 | HR 93 | Temp 97.8°F | Ht 61.0 in | Wt 208.8 lb

## 2017-04-09 DIAGNOSIS — Z79891 Long term (current) use of opiate analgesic: Secondary | ICD-10-CM | POA: Diagnosis not present

## 2017-04-09 DIAGNOSIS — I10 Essential (primary) hypertension: Secondary | ICD-10-CM

## 2017-04-09 DIAGNOSIS — E039 Hypothyroidism, unspecified: Secondary | ICD-10-CM | POA: Diagnosis not present

## 2017-04-09 DIAGNOSIS — Z79899 Other long term (current) drug therapy: Secondary | ICD-10-CM | POA: Diagnosis not present

## 2017-04-09 DIAGNOSIS — Z7984 Long term (current) use of oral hypoglycemic drugs: Secondary | ICD-10-CM

## 2017-04-09 DIAGNOSIS — M5136 Other intervertebral disc degeneration, lumbar region: Secondary | ICD-10-CM | POA: Diagnosis not present

## 2017-04-09 DIAGNOSIS — E1140 Type 2 diabetes mellitus with diabetic neuropathy, unspecified: Secondary | ICD-10-CM | POA: Diagnosis not present

## 2017-04-09 DIAGNOSIS — Z87891 Personal history of nicotine dependence: Secondary | ICD-10-CM

## 2017-04-09 DIAGNOSIS — E1165 Type 2 diabetes mellitus with hyperglycemia: Secondary | ICD-10-CM | POA: Diagnosis not present

## 2017-04-09 DIAGNOSIS — Z0000 Encounter for general adult medical examination without abnormal findings: Secondary | ICD-10-CM

## 2017-04-09 DIAGNOSIS — M48061 Spinal stenosis, lumbar region without neurogenic claudication: Secondary | ICD-10-CM | POA: Diagnosis not present

## 2017-04-09 DIAGNOSIS — E034 Atrophy of thyroid (acquired): Secondary | ICD-10-CM

## 2017-04-09 DIAGNOSIS — B372 Candidiasis of skin and nail: Secondary | ICD-10-CM | POA: Diagnosis not present

## 2017-04-09 DIAGNOSIS — E118 Type 2 diabetes mellitus with unspecified complications: Secondary | ICD-10-CM

## 2017-04-09 MED ORDER — HYDROCODONE-ACETAMINOPHEN 7.5-325 MG PO TABS: 1.0000 | ORAL_TABLET | Freq: Three times a day (TID) | ORAL | 0 refills | Status: DC | PRN

## 2017-04-09 MED ORDER — NYSTATIN 100000 UNIT/GM EX POWD: Freq: Four times a day (QID) | CUTANEOUS | 0 refills | Status: DC

## 2017-04-09 MED ORDER — ENALAPRIL MALEATE 5 MG PO TABS: 5.0000 mg | ORAL_TABLET | Freq: Every day | ORAL | 3 refills | Status: DC

## 2017-04-09 NOTE — Assessment & Plan Note (Signed)
Mammogram ordered today. Pap smear is due by 2020.

## 2017-04-09 NOTE — Assessment & Plan Note (Signed)
Symptomatically stable on low dose Synthroid. Plan to check TSH today.

## 2017-04-09 NOTE — Assessment & Plan Note (Signed)
Chronic pain generator is lumbar spinal stenosis with other degenerative findings. We are currently medically managing her because she is uninterested in undergoing more surgical procedures. She is showing tolerance to current hydrocodone dosing and occasionally taking more than prescribed. Ran out 4 days early this month. I advised her to take this medication only as prescribed, and that I would not increase opioids any further given risk when combined with alprazolam. We renewed our pain contract today. We collected a urine tox screen. I reviewed [facility] which shows appropriate dispensing: hydrocodone by me and Alprazolam/Zolpidem by Dr. Mila HomerSena with psychiatry. I gave her three month refills for hydrocodone today.

## 2017-04-09 NOTE — Patient Instructions (Addendum)
1. We updated your medication contract today. Be sure to use the medicines as directed to avoid running out of Norco early.   2. We will check your blood work today.   3. I am increasing your enalapril to 5mg  once daily because of your elevated blood pressures.

## 2017-04-09 NOTE — Assessment & Plan Note (Signed)
Hemoglobin A1c 6.8%, which is at goal. Doing excellent without side effects. Plan to continue with metformin 1000 mg twice a day. Enalapril for renal protection. Pravastatin for primary prevention.

## 2017-04-09 NOTE — Assessment & Plan Note (Signed)
Recurrent problem for her especially in the hotter months. I reordered nystatin powder to be used as needed on affected area.

## 2017-04-09 NOTE — Assessment & Plan Note (Signed)
Blood pressure is above goal today. Plan to increase enalapril from 2.5 mg to 5 mg once daily. Check BMP today. Follow up in 3 months.

## 2017-04-09 NOTE — Progress Notes (Signed)
Assessment and Plan:  See Encounters tab for problem-based medical decision making.   __________________________________________________________  HPI:  56 year old woman here for follow-up of chronic lower back pain. Patient has known lumbar stenosis along with other degenerative disc disease and facet disease. The past she has followed with Dr. Lovell SheehanJenkins but wants to avoid further testing such as myelogram and further surgeries. For now she is using only hydrocodone, and the past she was requiring high doses of oral Dilaudid. She is doing fairly well at home. She reports being able to ambulate, walks outside. Tried to do some gardening but says that her back hurt for many days afterwards. She is independent in her activities of daily living. Her sister lives next door to her, she relies on her sister for significant amount of help. Patient does drive, not at night. She does do some grocery shopping but needs help. She walks with a cane. She reports some days needing more hydrocodone than what is prescribed, occasionally taking 4 tablets per day. Says this is caused her to run out early a few months. Denies any recent hospitalizations. Denies any recent falls. Denies daytime somnolence or confusion. No fevers or chills. No chest pain or shortness of breath.  __________________________________________________________  Problem List: Patient Active Problem List   Diagnosis Date Noted  . Chronic use of opiate for therapeutic purpose 02/08/2016    Priority: High  . Spinal stenosis of lumbar region 03/01/2011    Priority: High  . Diabetes mellitus type 2, uncontrolled (HCC) 07/06/2006    Priority: High  . Generalized anxiety disorder 07/06/2006    Priority: High  . AVN (avascular necrosis of bone) (HCC) 03/07/2013    Priority: Medium  . Hypothyroidism 07/06/2006    Priority: Medium  . Essential hypertension 07/06/2006    Priority: Medium  . Chronic venous insufficiency 02/26/2017    Priority:  Low  . Candidal skin infection 03/03/2014    Priority: Low  . Preventative health care 05/14/2013    Priority: Low  . Hyperlipemia 07/06/2006    Priority: Low  . GERD 07/06/2006    Priority: Low    Medications: Reconciled today in Epic __________________________________________________________  Physical Exam:  Vital Signs: Vitals:   04/09/17 1043  BP: (!) 163/80  Pulse: 93  Temp: 97.8 F (36.6 C)  TempSrc: Oral  SpO2: 100%  Weight: 208 lb 12.8 oz (94.7 kg)  Height: 5\' 1"  (1.549 m)    Gen: Well appearing, NAD Neck: No cervical LAD, No thyromegaly or nodules. CV: RRR, no murmurs Pulm: Normal effort, CTA throughout, no wheezing  Ext: Warm, trace bilateral edema, normal joints Skin: No atypical appearing moles. No rashes

## 2017-04-10 ENCOUNTER — Encounter: Payer: Self-pay | Admitting: Student in an Organized Health Care Education/Training Program

## 2017-04-10 LAB — BMP8+ANION GAP
ANION GAP: 14 mmol/L (ref 10.0–18.0)
BUN/Creatinine Ratio: 21 (ref 9–23)
BUN: 13 mg/dL (ref 6–24)
CALCIUM: 9.2 mg/dL (ref 8.7–10.2)
CHLORIDE: 97 mmol/L (ref 96–106)
CO2: 26 mmol/L (ref 18–29)
Creatinine, Ser: 0.63 mg/dL (ref 0.57–1.00)
GFR, EST AFRICAN AMERICAN: 116 mL/min/{1.73_m2} (ref >59)
GFR, EST NON AFRICAN AMERICAN: 101 mL/min/{1.73_m2} (ref >59)
GLUCOSE: 96 mg/dL (ref 65–99)
POTASSIUM: 4.4 mmol/L (ref 3.5–5.2)
SODIUM: 137 mmol/L (ref 134–144)

## 2017-04-10 LAB — TSH: TSH: 2.48 u[IU]/mL (ref 0.450–4.500)

## 2017-04-10 LAB — HEMOGLOBIN A1C
Est. average glucose Bld gHb Est-mCnc: 154 mg/dL
HEMOGLOBIN A1C: 7 % — AB (ref 4.8–5.6)

## 2017-04-12 LAB — TOXASSURE SELECT,+ANTIDEPR,UR

## 2017-04-23 ENCOUNTER — Other Ambulatory Visit: Payer: Self-pay | Admitting: Student in an Organized Health Care Education/Training Program

## 2017-04-23 DIAGNOSIS — I10 Essential (primary) hypertension: Secondary | ICD-10-CM

## 2017-04-25 ENCOUNTER — Telehealth: Payer: Self-pay | Admitting: Student in an Organized Health Care Education/Training Program

## 2017-04-27 ENCOUNTER — Ambulatory Visit (INDEPENDENT_AMBULATORY_CARE_PROVIDER_SITE_OTHER): Payer: Medicare HMO | Admitting: Internal Medicine

## 2017-04-27 ENCOUNTER — Telehealth: Payer: Self-pay | Admitting: *Deleted

## 2017-04-27 DIAGNOSIS — Z87891 Personal history of nicotine dependence: Secondary | ICD-10-CM

## 2017-04-27 DIAGNOSIS — Z79899 Other long term (current) drug therapy: Secondary | ICD-10-CM | POA: Diagnosis not present

## 2017-04-27 DIAGNOSIS — I872 Venous insufficiency (chronic) (peripheral): Secondary | ICD-10-CM | POA: Diagnosis not present

## 2017-04-27 DIAGNOSIS — I10 Essential (primary) hypertension: Secondary | ICD-10-CM | POA: Diagnosis not present

## 2017-04-27 MED ORDER — FUROSEMIDE 40 MG PO TABS
40.0000 mg | ORAL_TABLET | Freq: Two times a day (BID) | ORAL | 11 refills | Status: DC
Start: 2017-04-27 — End: 2017-04-27

## 2017-04-27 MED ORDER — FUROSEMIDE 40 MG PO TABS: 40.0000 mg | ORAL_TABLET | Freq: Two times a day (BID) | ORAL | 0 refills | Status: DC

## 2017-04-27 NOTE — Patient Instructions (Addendum)
Thank you for visiting clinic today. It is really important that he use compression stockings or Ace bandage wrap to help this fluid go back to your veins. I'm giving you a prescription for Lasix 40 mg twice daily, please take it as directed. Please follow-up in one week to see your response. We will check your blood during your next follow-up visit.

## 2017-04-27 NOTE — Telephone Encounter (Signed)
Thank you Helen.. I agree 

## 2017-04-27 NOTE — Assessment & Plan Note (Signed)
BP Readings from Last 3 Encounters:  04/27/17 139/81  04/09/17 (!) 163/80  02/26/17 (!) 142/63   Her blood pressure was at goal.  Continue current dose of enalapril 5 mg daily, which was recently increased during previous office visit.

## 2017-04-27 NOTE — Assessment & Plan Note (Signed)
She came to the clinic with complaint of worsening bilateral lower extremity swelling and redness, along with mild oozing.  She had clear chest on exam. She did had a weight gain of 12 pounds over 2 weeks. Her lower extremity swelling and erythema erythema is more consistent with chronic venous insufficiency. she was advised to use compression stockings in the past, according to patient she cannot use them as it cut through her skin. In the past she was also given Lasix, which was taken out because of noncompliance.  -I advised her to use ACE wrap if she cannot tolerate compression stockings, keep her legs elevated. -We'll give her a prescription for Lasix 40 mg twice a day, along with potassium replacement. -Follow-up in one week. -We will check CMP instead of BMP on follow-up, because of patient's concern about liver disease, although there is no sign of any hepatic insufficiency.

## 2017-04-27 NOTE — Progress Notes (Signed)
   CC: Worsening bilateral leg swelling.  HPI:  Mary Gray is a 56 y.o. with past medical history as listed below came to the clinic with complaint of worsening bilateral lower extremity swelling and redness, along with mild oozing.  Patient had an history of chronic venous insufficiency, she was advised to use compression stockings in the past, according to patient she cannot use them as it cut through her skin. In the past she was also given Lasix, which was taken out because of noncompliance.  She denies any dyspnea, chest pain, palpitations, orthopnea or PND.  She was concerned that she is having a liver failure as one of her sister died of liver failure and she was having lower extremity swelling too. She denies any nausea, vomiting, abdominal swelling, change in her appetite or bowel habits. She drinks a small glass of wine daily as advised by her cardiologist.   Past Medical History:  Diagnosis Date  . Chronic interstitial cystitis    Followed by Dr. Marcine MatarStephen Dahlstedt  . COPD (chronic obstructive pulmonary disease) (HCC)   . DDD (degenerative disc disease), lumbar   . Degeneration of cervical intervertebral disc   . Depression   . Diabetes mellitus, type 2 (HCC)   . Heme positive stool    EGD on 04/08/2010 by Dr. Ewing SchleinMagod showed chronic gastritis and a few gastric polyps; pathology showed a fundic gland polyp.  Colonoscopy on 04/08/2010 showed small external and internal hemorrhoids, and a few benign-appearing diminutive polyps in the rectum, the distal sigmoid colon, and in the distal descending colon; pathology showed hyperplastic polyps.   . Hypertension   . Hypothyroidism   . Iron deficiency anemia   . Spinal stenosis     Review of Systems:  As per HPI.  Physical Exam:  Vitals:   04/27/17 1617  BP: 139/81  Temp: 98.2 F (36.8 C)  TempSrc: Oral  SpO2: 99%  Weight: 220 lb 9.6 oz (100.1 kg)  Height: 5\' 1"  (1.549 m)   Vitals:   04/27/17 1617  BP: 139/81    Temp: 98.2 F (36.8 C)  TempSrc: Oral  SpO2: 99%  Weight: 220 lb 9.6 oz (100.1 kg)  Height: 5\' 1"  (1.549 m)   General: Vital signs reviewed.  Patient is well-developed and well-nourished, in no acute distress and cooperative with exam.  Eyes: EOMI, conjunctivae normal, no scleral icterus.  Cardiovascular: RRR, S1 normal, S2 normal, no murmurs, gallops, or rubs. Pulmonary/Chest: Clear to auscultation bilaterally, no wheezes, rales, or rhonchi. Abdominal: Soft, non-tender, non-distended, BS +, no masses, organomegaly, or guarding present.  Extremities: Bilateral 2+ pitting edema along with some lower extremity erythema up to thighs. skin was intact with mild oozing. Psychiatric: Normal mood and affect. .  Assessment & Plan:   See Encounters Tab for problem based charting.  Patient discussed with Dr. Heide SparkNarendra.

## 2017-04-27 NOTE — Telephone Encounter (Signed)
Spoke w/ pt, she is coming to appt today, states she has had "leaking" of lower legs for 11/2 weeks and is very concerned. appt was given for 1545

## 2017-04-30 NOTE — Progress Notes (Signed)
Internal Medicine Clinic Attending  Case discussed with Dr. Amin at the time of the visit.  We reviewed the resident's history and exam and pertinent patient test results.  I agree with the assessment, diagnosis, and plan of care documented in the resident's note.    

## 2017-05-04 ENCOUNTER — Ambulatory Visit (HOSPITAL_COMMUNITY)
Admission: RE | Admit: 2017-05-04 | Discharge: 2017-05-04 | Disposition: A | Discharge status: Home / Self Care | Payer: Medicare HMO | Source: Ambulatory Visit | Attending: Internal Medicine | Admitting: Internal Medicine

## 2017-05-04 ENCOUNTER — Encounter: Payer: Self-pay | Admitting: Internal Medicine

## 2017-05-04 ENCOUNTER — Ambulatory Visit (INDEPENDENT_AMBULATORY_CARE_PROVIDER_SITE_OTHER): Payer: Medicare HMO | Admitting: Internal Medicine

## 2017-05-04 VITALS — BP 117/61 | HR 98 | Temp 98.4°F | Ht 61.0 in | Wt 214.1 lb

## 2017-05-04 DIAGNOSIS — L84 Corns and callosities: Secondary | ICD-10-CM

## 2017-05-04 DIAGNOSIS — M79669 Pain in unspecified lower leg: Secondary | ICD-10-CM | POA: Insufficient documentation

## 2017-05-04 DIAGNOSIS — Z87891 Personal history of nicotine dependence: Secondary | ICD-10-CM

## 2017-05-04 DIAGNOSIS — E1165 Type 2 diabetes mellitus with hyperglycemia: Secondary | ICD-10-CM

## 2017-05-04 DIAGNOSIS — Z79899 Other long term (current) drug therapy: Secondary | ICD-10-CM

## 2017-05-04 DIAGNOSIS — M79662 Pain in left lower leg: Secondary | ICD-10-CM | POA: Diagnosis not present

## 2017-05-04 DIAGNOSIS — Z79891 Long term (current) use of opiate analgesic: Secondary | ICD-10-CM

## 2017-05-04 DIAGNOSIS — M79661 Pain in right lower leg: Secondary | ICD-10-CM

## 2017-05-04 DIAGNOSIS — I872 Venous insufficiency (chronic) (peripheral): Secondary | ICD-10-CM | POA: Diagnosis not present

## 2017-05-04 DIAGNOSIS — E1140 Type 2 diabetes mellitus with diabetic neuropathy, unspecified: Secondary | ICD-10-CM

## 2017-05-04 DIAGNOSIS — I10 Essential (primary) hypertension: Secondary | ICD-10-CM

## 2017-05-04 DIAGNOSIS — E119 Type 2 diabetes mellitus without complications: Secondary | ICD-10-CM | POA: Diagnosis not present

## 2017-05-04 DIAGNOSIS — R740 Nonspecific elevation of levels of transaminase and lactic acid dehydrogenase [LDH]: Secondary | ICD-10-CM | POA: Diagnosis not present

## 2017-05-04 DIAGNOSIS — B372 Candidiasis of skin and nail: Secondary | ICD-10-CM

## 2017-05-04 MED ORDER — NYSTATIN 100000 UNIT/GM EX POWD: Freq: Four times a day (QID) | CUTANEOUS | 0 refills | Status: DC

## 2017-05-04 MED ORDER — BUDESONIDE-FORMOTEROL FUMARATE 160-4.5 MCG/ACT IN AERO: 2.0000 | INHALATION_SPRAY | Freq: Two times a day (BID) | RESPIRATORY_TRACT | 2 refills | Status: DC

## 2017-05-04 NOTE — Assessment & Plan Note (Signed)
She was still having erythema and edema of lower extremities bilaterally, little bit more pronounced on the left as compared to right.  According to patient she is using compression stockings and Ace wrap. There was no oozing today.  Her swelling was improved by adding Lasix and the use of compression stockings.  -We will check CMP today. -Continue current management.

## 2017-05-04 NOTE — Patient Instructions (Signed)
Thank you for visiting clinic today. Because of the pain in the back of fewer leg, we will do an ultrasound to make sure that there is no clot. We will call you with the results. We will also check your kidney and liver functions today. You can continue your current medications.

## 2017-05-04 NOTE — Progress Notes (Addendum)
CC: For follow-up of her lower extremity swelling.  HPI:  [name] is a 56 y.o. with past medical history as listed below came to the clinic for follow-up of her lower extremity swelling. Her lower extremity swelling is improving after the restart of Lasix. She is also using compression stockings and Ace wrap.  She was complaining of worsening pain in her legs which increased with flexion and extension of her feet, she was worried that her erythema is not improving. She denies any shortness of breath or chest pain.  She was also again requesting to check her liver function.  She was also complaining of painful calluses in her feet. Also requesting a referral to foot doctor for a diabetic shoe placement and to take care of her calluses.  Addendum. She has transaminitis with AST to ALT ratio more than 1, most likely consistent with alcoholic hepatitis. Because of her family history of liver failure, we will do a liver ultrasound, check for hep B and C, hep C was negative in 2016, and check for auto antibodies for any possibility of autoimmune hepatitis. I called the patient to come for lab workup, also advised her to stop drinking.  Addendum.05/10/17. Her hep B and C testing was negative. She is having mild decrease in IgG with normal IgA and IgM, which point [place] away from any autoimmune hepatitis, as it caused hypergammaglobulinemia. Most likely she is having alcoholic hepatitis. I will wait for right upper quadrant ultrasound.  Past Medical History:  Diagnosis Date  . Chronic interstitial cystitis    Followed by Dr. Marcine Matar  . COPD (chronic obstructive pulmonary disease) (HCC)   . DDD (degenerative disc disease), lumbar   . Degeneration of cervical intervertebral disc   . Depression   . Diabetes mellitus, type 2 (HCC)   . Heme positive stool    EGD on 04/08/2010 by Dr. Ewing Schlein showed chronic gastritis and a few gastric polyps; pathology showed a fundic gland polyp.   Colonoscopy on 04/08/2010 showed small external and internal hemorrhoids, and a few benign-appearing diminutive polyps in the rectum, the distal sigmoid colon, and in the distal descending colon; pathology showed hyperplastic polyps.   . Hypertension   . Hypothyroidism   . Iron deficiency anemia   . Spinal stenosis     Review of Systems:  As per HPI.  Physical Exam:  Vitals:   05/04/17 1323  BP: 117/61  Pulse: 98  Temp: 98.4 F (36.9 C)  TempSrc: Oral  SpO2: 99%  Weight: 214 lb 1.6 oz (97.1 kg)  Height: 5\' 1"  (1.549 m)   Vitals:   05/04/17 1323  BP: 117/61  Pulse: 98  Temp: 98.4 F (36.9 C)  TempSrc: Oral  SpO2: 99%  Weight: 214 lb 1.6 oz (97.1 kg)  Height: 5\' 1"  (1.549 m)   General: Vital signs reviewed.  Patient is well-developed and well-nourished, in no acute distress and cooperative with exam.  HCardiovascular: RRR, S1 normal, S2 normal, no murmurs, gallops, or rubs. Pulmonary/Chest: Clear to auscultation bilaterally, no wheezes, rales, or rhonchi. Abdominal: Soft, non-tender, non-distended, BS +, no masses, organomegaly, or guarding present.  Extremities: Edema and erythema of both lower extremities up to knees, both lower legs were very tense and tender, little more pronounced on the left lower extremity. There was calf tenderness more on the left .Pulses 1+ bilaterally. Psychiatric: Normal mood and affect. speech and behavior is normal.   Assessment & Plan:   See Encounters Tab for problem based  charting.  Patient discussed with Dr. Rogelia BogaButcher.

## 2017-05-04 NOTE — Progress Notes (Signed)
VASCULAR LAB PRELIMINARY  PRELIMINARY  PRELIMINARY  PRELIMINARY  Bilateral lower extremity venous duplex completed.    Preliminary report:  Bilateral:  No evidence of DVT, superficial thrombosis, or Baker's Cyst.   Westin Knotts, RVS 05/04/2017, 4:27 PM

## 2017-05-04 NOTE — Assessment & Plan Note (Signed)
BP Readings from Last 3 Encounters:  05/04/17 117/61  04/27/17 139/81  04/09/17 (!) 163/80   She was normotensive today. She did took all of her medications this morning.  -Continue current management.

## 2017-05-04 NOTE — Assessment & Plan Note (Signed)
She was complaining of worsening pain in her legs which increased with flexion and extension of her feet.  On exam she was having pronounced calf tenderness bilaterally, more worse on left.  Her Well's score for DVT was 1-making her at moderate risk.  Venous Doppler for lower extremity DVT was obtained-it was negative for DVT according to preliminary report.

## 2017-05-05 LAB — CMP14 + ANION GAP
ALT: 49 IU/L — ABNORMAL HIGH (ref 0–32)
AST: 101 IU/L — ABNORMAL HIGH (ref 0–40)
Albumin/Globulin Ratio: 2 (ref 1.2–2.2)
Albumin: 4.5 g/dL (ref 3.5–5.5)
Alkaline Phosphatase: 69 IU/L (ref 39–117)
Anion Gap: 17 mmol/L (ref 10.0–18.0)
BILIRUBIN TOTAL: 0.4 mg/dL (ref 0.0–1.2)
BUN/Creatinine Ratio: 10 (ref 9–23)
BUN: 7 mg/dL (ref 6–24)
CALCIUM: 9.1 mg/dL (ref 8.7–10.2)
CO2: 30 mmol/L — ABNORMAL HIGH (ref 20–29)
Chloride: 90 mmol/L — ABNORMAL LOW (ref 96–106)
Creatinine, Ser: 0.68 mg/dL (ref 0.57–1.00)
GFR calc non Af Amer: 98 mL/min/{1.73_m2} (ref >59)
GFR, EST AFRICAN AMERICAN: 113 mL/min/{1.73_m2} (ref >59)
Globulin, Total: 2.2 g/dL (ref 1.5–4.5)
Glucose: 188 mg/dL — ABNORMAL HIGH (ref 65–99)
Potassium: 3.6 mmol/L (ref 3.5–5.2)
Sodium: 137 mmol/L (ref 134–144)
TOTAL PROTEIN: 6.7 g/dL (ref 6.0–8.5)

## 2017-05-07 ENCOUNTER — Telehealth: Payer: Self-pay

## 2017-05-07 ENCOUNTER — Other Ambulatory Visit: Payer: Self-pay | Admitting: Internal Medicine

## 2017-05-07 DIAGNOSIS — R7401 Elevation of levels of liver transaminase levels: Secondary | ICD-10-CM

## 2017-05-07 DIAGNOSIS — R740 Nonspecific elevation of levels of transaminase and lactic acid dehydrogenase [LDH]: Principal | ICD-10-CM

## 2017-05-07 NOTE — Progress Notes (Signed)
Internal Medicine Clinic Attending  Case discussed with Dr. Amin at the time of the visit.  We reviewed the resident's history and exam and pertinent patient test results.  I agree with the assessment, diagnosis, and plan of care documented in the resident's note.    

## 2017-05-07 NOTE — Telephone Encounter (Signed)
Requesting lab result. Please call pt back.  

## 2017-05-09 ENCOUNTER — Other Ambulatory Visit (INDEPENDENT_AMBULATORY_CARE_PROVIDER_SITE_OTHER): Payer: Medicare HMO

## 2017-05-09 DIAGNOSIS — R7401 Elevation of levels of liver transaminase levels: Secondary | ICD-10-CM

## 2017-05-09 DIAGNOSIS — R740 Nonspecific elevation of levels of transaminase and lactic acid dehydrogenase [LDH]: Secondary | ICD-10-CM

## 2017-05-10 LAB — HEPATITIS B SURFACE ANTIBODY,QUALITATIVE: Hep B Surface Ab, Qual: NONREACTIVE

## 2017-05-10 LAB — HEPATITIS B DNA, ULTRAQUANTITATIVE, PCR: HBV DNA SERPL PCR-ACNC: NOT DETECTED [IU]/mL

## 2017-05-10 LAB — IGG, IGA, IGM
IgA/Immunoglobulin A, Serum: 157 mg/dL (ref 87–352)
IgG (Immunoglobin G), Serum: 644 mg/dL — ABNORMAL LOW (ref 700–1600)
IgM (Immunoglobulin M), Srm: 101 mg/dL (ref 26–217)

## 2017-05-10 LAB — HEPATITIS C ANTIBODY

## 2017-05-10 LAB — ANTINUCLEAR ANTIBODIES, IFA: ANA Titer 1: NEGATIVE

## 2017-05-10 LAB — HEPATITIS B SURFACE ANTIGEN: HEP B S AG: NEGATIVE

## 2017-05-14 NOTE — Telephone Encounter (Signed)
I already called the patient and discuss the results.

## 2017-05-25 ENCOUNTER — Ambulatory Visit (HOSPITAL_COMMUNITY): Payer: Medicare HMO

## 2017-05-29 ENCOUNTER — Other Ambulatory Visit: Payer: Self-pay | Admitting: Student in an Organized Health Care Education/Training Program

## 2017-05-29 DIAGNOSIS — B372 Candidiasis of skin and nail: Secondary | ICD-10-CM

## 2017-05-29 NOTE — Telephone Encounter (Signed)
PATIENT CALLED HER PHARMACY [facility] AND REQUESTED NYSTATIN POWDER, THEY SENT ORDER TO US 05/25/17, STILL WAITING ON CALL BACK

## 2017-05-30 MED ORDER — NYSTATIN 100000 UNIT/GM EX POWD: Freq: Four times a day (QID) | CUTANEOUS | 0 refills | Status: DC

## 2017-06-05 ENCOUNTER — Other Ambulatory Visit: Payer: Self-pay | Admitting: Student in an Organized Health Care Education/Training Program

## 2017-06-06 NOTE — Telephone Encounter (Signed)
Pls sch PCP appt Aug pain F/U

## 2017-06-07 ENCOUNTER — Ambulatory Visit (HOSPITAL_COMMUNITY)
Admission: RE | Admit: 2017-06-07 | Discharge: 2017-06-07 | Disposition: A | Discharge status: Home / Self Care | Payer: Medicare HMO | Source: Ambulatory Visit | Attending: Internal Medicine | Admitting: Internal Medicine

## 2017-06-07 DIAGNOSIS — K760 Fatty (change of) liver, not elsewhere classified: Secondary | ICD-10-CM | POA: Insufficient documentation

## 2017-06-07 DIAGNOSIS — R7401 Elevation of levels of liver transaminase levels: Secondary | ICD-10-CM

## 2017-06-07 DIAGNOSIS — R740 Nonspecific elevation of levels of transaminase and lactic acid dehydrogenase [LDH]: Secondary | ICD-10-CM

## 2017-06-07 DIAGNOSIS — R945 Abnormal results of liver function studies: Secondary | ICD-10-CM | POA: Diagnosis not present

## 2017-06-11 ENCOUNTER — Telehealth: Payer: Self-pay | Admitting: Student in an Organized Health Care Education/Training Program

## 2017-06-11 NOTE — Telephone Encounter (Signed)
PATIENT WANTS RESULTS ON ULTRASOUND DONE 06/07/17

## 2017-06-12 ENCOUNTER — Telehealth: Payer: Self-pay | Admitting: Student in an Organized Health Care Education/Training Program

## 2017-06-12 NOTE — Telephone Encounter (Signed)
I spoke with the patient and gave her the results.

## 2017-06-12 NOTE — Telephone Encounter (Signed)
Done

## 2017-06-12 NOTE — Telephone Encounter (Signed)
WOULD LIKE RESULTS FROM ULTRASOUND DONE 06/07/17

## 2017-06-15 ENCOUNTER — Other Ambulatory Visit: Payer: Self-pay | Admitting: Internal Medicine

## 2017-06-20 ENCOUNTER — Ambulatory Visit (INDEPENDENT_AMBULATORY_CARE_PROVIDER_SITE_OTHER): Payer: Medicare HMO | Admitting: Podiatry

## 2017-06-20 VITALS — BP 106/70 | HR 84

## 2017-06-20 DIAGNOSIS — E1142 Type 2 diabetes mellitus with diabetic polyneuropathy: Secondary | ICD-10-CM

## 2017-06-20 DIAGNOSIS — E11628 Type 2 diabetes mellitus with other skin complications: Secondary | ICD-10-CM

## 2017-06-20 DIAGNOSIS — L84 Corns and callosities: Secondary | ICD-10-CM

## 2017-06-20 NOTE — Progress Notes (Signed)
   Subjective:    Patient ID: Mary Gray, female    DOB: [birth year], 56 y.o.   MRN: [id number]  HPI this patient presents to the office for an evaluation of her diabetic feet.  She says she was referred to this office by her medical doctor.  She says she used to be a patient of Dr. Irving ShowsEgerton , who prescribed gabapentin for her neuropathy pain.  She presents the office today stating that she's having difficulty with swelling in her feet.  She says she has been under treatment for her swelling by her medical doctor and is taking Lasix.  She also says that she is experiencing burning and tingling pain noted in her feet.  She says this is severe at times.  She presents the office today for an evaluation of her diabetic feet as well as treatment for her tingling and burning pain    Review of Systems  Constitutional: Positive for activity change, appetite change, fatigue and unexpected weight change.  HENT: Positive for ear pain, hearing loss, sneezing, sore throat, tinnitus and trouble swallowing.        Sinus problems-unspecified  Eyes: Positive for pain, redness, itching and visual disturbance.  Respiratory: Positive for shortness of breath.   Cardiovascular: Positive for leg swelling.       Calf pain when walking  Gastrointestinal: Positive for abdominal distention, abdominal pain, blood in stool, constipation and nausea.  Endocrine: Positive for heat intolerance, polydipsia and polyuria.  Genitourinary: Positive for difficulty urinating and urgency.  Musculoskeletal: Positive for back pain and gait problem.       Joint and muscle pain  Skin:       Changes in nails  Neurological: Positive for dizziness, weakness, light-headedness, numbness and headaches.  Hematological:       Slow to heal  Psychiatric/Behavioral: Positive for confusion and hallucinations.  All other systems reviewed and are negative.      Objective:   Physical Exam GENERAL APPEARANCE: Alert, conversant. Appropriately  groomed. No acute distress.  VASCULAR: Pedal pulses are  palpable at  DP  bilateral. PT pulses are not palpable  Capillary refill time is immediate to all digits,  Normal temperature gradient.   NEUROLOGIC: sensation is diminished to 5.07 monofilament at 5/5 sites bilateral.  Light touch is diminished  bilateral, Muscle strength normal.  MUSCULOSKELETAL: acceptable muscle strength, tone and stability bilateral.  Intrinsic muscluature intact bilateral.  Rectus appearance of foot and digits noted bilateral. Swelling both feet and legs.    DERMATOLOGIC: skin color, texture, and turgor are within normal limits.  No preulcerative lesions or ulcers  are seen, no interdigital maceration noted.  No open lesions present.  Digital nails are asymptomatic. No drainage noted. Asymptomatic pinch callus  B/L         Assessment & Plan:  Diabetic angiopathy  Diabetic neuropathy   IE  Diabetic foot exam was performed.  Patient was evaluated and found to have no posterior tibial pulses which may be due to her swelling.  She does have tingling and burning persisting in both feet.  I discussed this with the patient and told her she is a good candidate for gabapentin.  I told the patient that I am not familiar with many of her systemic medications, and therefore this should be prescribed by her medical doctor.  This patient was told to return to the office in 3 months for preventative foot care services   Helane GuntherGregory Baby Stairs DPM

## 2017-06-22 ENCOUNTER — Ambulatory Visit (HOSPITAL_COMMUNITY)
Admission: EM | Admit: 2017-06-22 | Discharge: 2017-06-22 | Disposition: A | Discharge status: Home / Self Care | Payer: Medicare HMO | Attending: Emergency Medicine | Admitting: Emergency Medicine

## 2017-06-22 ENCOUNTER — Encounter (HOSPITAL_COMMUNITY): Payer: Self-pay | Admitting: Family Medicine

## 2017-06-22 ENCOUNTER — Other Ambulatory Visit: Payer: Self-pay | Admitting: Student in an Organized Health Care Education/Training Program

## 2017-06-22 DIAGNOSIS — L298 Other pruritus: Secondary | ICD-10-CM | POA: Diagnosis not present

## 2017-06-22 DIAGNOSIS — B373 Candidiasis of vulva and vagina: Secondary | ICD-10-CM | POA: Diagnosis not present

## 2017-06-22 DIAGNOSIS — B3731 Acute candidiasis of vulva and vagina: Secondary | ICD-10-CM

## 2017-06-22 DIAGNOSIS — N898 Other specified noninflammatory disorders of vagina: Secondary | ICD-10-CM

## 2017-06-22 DIAGNOSIS — I10 Essential (primary) hypertension: Secondary | ICD-10-CM

## 2017-06-22 MED ORDER — CLOTRIMAZOLE 1 % VA CREA: 1.0000 | TOPICAL_CREAM | Freq: Every day | VAGINAL | 1 refills | Status: DC

## 2017-06-22 MED ORDER — FLUCONAZOLE 200 MG PO TABS: 200.0000 mg | ORAL_TABLET | Freq: Every day | ORAL | 0 refills | Status: AC

## 2017-06-22 NOTE — Discharge Instructions (Signed)
Monitor sugar levels  May use a compress for itching avoid using nails to scratch can cause skin abrasions  Follow up with pcp  May take a few days for symptoms to be completely gone.

## 2017-06-22 NOTE — ED Triage Notes (Signed)
Pt here for vaginal itching and irritation.  

## 2017-06-22 NOTE — ED Provider Notes (Signed)
CSN: [id number]     Arrival date & time 06/22/17  1436 History   First MD Initiated Contact with Patient 06/22/17 1501     Chief Complaint  Patient presents with  . Vaginal Itching   (Consider location/radiation/quality/duration/timing/severity/associated sxs/prior Treatment) Pt has had vaginal itching/ burning to external labia area and labia majora for 1 week not. States that she has had this in the past 1 yr ago. She is a diabetic and that it is common for her to have these. Denies any urinary sx, no fevers, no vaginal discharge.       Past Medical History:  Diagnosis Date  . Chronic interstitial cystitis    Followed by Dr. Marcine Matar  . COPD (chronic obstructive pulmonary disease) (HCC)   . DDD (degenerative disc disease), lumbar   . Degeneration of cervical intervertebral disc   . Depression   . Diabetes mellitus, type 2 (HCC)   . Heme positive stool    EGD on 04/08/2010 by Dr. Ewing Schlein showed chronic gastritis and a few gastric polyps; pathology showed a fundic gland polyp.  Colonoscopy on 04/08/2010 showed small external and internal hemorrhoids, and a few benign-appearing diminutive polyps in the rectum, the distal sigmoid colon, and in the distal descending colon; pathology showed hyperplastic polyps.   . Hypertension   . Hypothyroidism   . Iron deficiency anemia   . Spinal stenosis    Past Surgical History:  Procedure Laterality Date  . CARPAL TUNNEL RELEASE  05/08/2000   By Dr. Katy Fitch. Sypher, Montez Hageman.   Family History  Problem Relation Age of Onset  . Heart attack Father 48  . Diabetes Mother   . Hypertension Mother   . Diabetes Sister   . Breast cancer Neg Hx   . Colon cancer Neg Hx    Social History  Substance Use Topics  . Smoking status: Former Smoker    Packs/day: 0.50    Years: 15.00    Types: Cigarettes    Quit date: 01/18/1996  . Smokeless tobacco: Never Used  . Alcohol use No   OB History    No data available     Review of Systems   Constitutional: Negative.   Respiratory: Negative.   Cardiovascular: Negative.   Gastrointestinal: Negative.   Endocrine: Negative.   Genitourinary:       Vaginal burning and itching / redness   Skin:       Redness to external labia area     Allergies  Codeine sulfate; Meperidine and related; Minocycline hcl; Penicillins; Propoxyphene n-acetaminophen; and Tetracycline hcl  Home Medications   Prior to Admission medications   Medication Sig Start Date End Date Taking? Authorizing Provider  acetaminophen (TYLENOL) 500 MG tablet Take 1,000 mg by mouth every 8 (eight) hours as needed for mild pain or headache. Reported on 02/07/2016    [provider]  albuterol (PROAIR HFA) 108 (90 BASE) MCG/ACT inhaler Inhale 2 puffs into the lungs every 6 (six) hours as needed for wheezing or shortness of breath. 05/25/14   [name], MD  ALPRAZolam Prudy Feeler) 1 MG tablet Take 1 mg by mouth 4 (four) times daily as needed for anxiety.    [provider]  amitriptyline (ELAVIL) 25 MG tablet Take 1 tablet (25 mg total) by mouth at bedtime. Patient taking differently: Take 50 mg by mouth at bedtime.  08/27/14   [name], MD  budesonide-formoterol (SYMBICORT) 160-4.5 MCG/ACT inhaler Inhale 2 puffs into the lungs 2 (two) times daily. 05/04/17  Arnetha CourserAmin, Sumayya, MD  clotrimazole (GYNE-LOTRIMIN) 1 % vaginal cream Place 1 Applicatorful vaginally at bedtime. 06/22/17   Maple MirzaMitchell, Michon Kaczmarek A, NP  enalapril (VASOTEC) 5 MG tablet Take 1 tablet (5 mg total) by mouth daily. 04/09/17   Tyson AliasVincent, Duncan Thomas, MD  esomeprazole (NEXIUM) 40 MG capsule Take 1 capsule (40 mg total) by mouth daily. 08/12/15   Deneise LeverSaraiya, Parth, MD  fluconazole (DIFLUCAN) 200 MG tablet Take 1 tablet (200 mg total) by mouth daily. 06/22/17 06/29/17  Tobi BastosMitchell, Keiandra Sullenger A, NP  FLUoxetine (PROZAC) 20 MG capsule Take 20 mg by mouth daily.  11/15/13   [provider]  fluticasone Aleda Grana(FLONASE) 50 MCG/ACT nasal spray [facility]  NOSTRIL ONCE A DAY 06/06/17   Burns SpainButcher, Elizabeth A, MD  furosemide (LASIX) 40 MG tablet TAKE 1 TABLET BY MOUTH TWICE A DAY 06/15/17   Tyson AliasVincent, Duncan Thomas, MD  HYDROcodone-acetaminophen (NORCO) 7.5-325 MG tablet Take 1 tablet by mouth 3 (three) times daily as needed for moderate pain. 04/09/17   Tyson AliasVincent, Duncan Thomas, MD  ipratropium (ATROVENT HFA) 17 MCG/ACT inhaler Inhale 2 puffs into the lungs 4 (four) times daily as needed for wheezing. 10/09/14   Margarito LinerJoines, Jerry, MD  KLOR-CON M10 10 MEQ tablet TAKE 2 TABLETS BY MOUTH EVERY DAY 04/23/17   Tyson AliasVincent, Duncan Thomas, MD  lactulose (KRISTALOSE) 20 g packet Take 1 packet (20 g total) by mouth daily as needed (constipation). 01/22/17   Tyson AliasVincent, Duncan Thomas, MD  levocetirizine (XYZAL) 5 MG tablet Take 1 tablet (5 mg total) by mouth every evening. 09/26/16   Tyson AliasVincent, Duncan Thomas, MD  metFORMIN (GLUCOPHAGE-XR) 500 MG 24 hr tablet Take 2 tablets (1,000 mg total) by mouth 2 (two) times daily. 09/26/16   Tyson AliasVincent, Duncan Thomas, MD  nystatin (NYSTATIN) powder Apply topically 4 (four) times daily. 05/30/17   Tyson AliasVincent, Duncan Thomas, MD  pravastatin (PRAVACHOL) 10 MG tablet TAKE 1 TABLET (10 MG TOTAL) BY MOUTH EVERY EVENING. 02/12/17   Tyson AliasVincent, Duncan Thomas, MD  SYNTHROID 88 MCG tablet TAKE 1 TABLET (88 MCG TOTAL) BY MOUTH DAILY BEFORE BREAKFAST. 12/26/16   Tyson AliasVincent, Duncan Thomas, MD  zolpidem (AMBIEN) 10 MG tablet Take 5 mg by mouth at bedtime as needed for sleep.     [provider]   Meds Ordered and Administered this Visit  Medications - No data to display  BP 128/69   Pulse 88   Temp 98.5 F (36.9 C)   Resp 18   LMP 01/17/2009 Comment: spotting  SpO2 98%  No data found.   Physical Exam  Constitutional: She appears well-developed.  Cardiovascular: Normal rate.   Pulmonary/Chest: Effort normal and breath sounds normal.  Abdominal: Soft. Bowel sounds are normal.  Genitourinary:  Genitourinary Comments: Labia manor and labia majora has erythema,  mild edema painful to touch, no open sores , no drainage.   Musculoskeletal:  Chronic back pain   Neurological: She is alert.  Skin: Capillary refill takes less than 2 seconds. Rash noted. There is erythema.    Urgent Care Course     Procedures (including critical care time)  Labs Review Labs Reviewed - No data to display  Imaging Review No results found.           MDM   1. Yeast infection of the vagina   2. Itching of vagina    Monitor sugar levels  May use a compress for itching avoid using nails to scratch can cause skin abrasions  Follow up with pcp  May take  a few days for symptoms to be completely gone.    Tobi BastosMitchell, Khilynn Borntreger A, NP 06/22/17 [id number]

## 2017-06-23 ENCOUNTER — Other Ambulatory Visit: Payer: Self-pay | Admitting: Student in an Organized Health Care Education/Training Program

## 2017-06-25 ENCOUNTER — Other Ambulatory Visit: Payer: Self-pay | Admitting: Dietician

## 2017-06-25 ENCOUNTER — Ambulatory Visit (INDEPENDENT_AMBULATORY_CARE_PROVIDER_SITE_OTHER): Payer: Medicare HMO | Admitting: Student in an Organized Health Care Education/Training Program

## 2017-06-25 VITALS — BP 139/75 | HR 85 | Wt 219.0 lb

## 2017-06-25 DIAGNOSIS — B373 Candidiasis of vulva and vagina: Secondary | ICD-10-CM | POA: Diagnosis not present

## 2017-06-25 DIAGNOSIS — Z79891 Long term (current) use of opiate analgesic: Secondary | ICD-10-CM | POA: Diagnosis not present

## 2017-06-25 DIAGNOSIS — E1140 Type 2 diabetes mellitus with diabetic neuropathy, unspecified: Secondary | ICD-10-CM

## 2017-06-25 DIAGNOSIS — B3731 Acute candidiasis of vulva and vagina: Secondary | ICD-10-CM

## 2017-06-25 DIAGNOSIS — I10 Essential (primary) hypertension: Secondary | ICD-10-CM

## 2017-06-25 DIAGNOSIS — E118 Type 2 diabetes mellitus with unspecified complications: Secondary | ICD-10-CM

## 2017-06-25 DIAGNOSIS — E1165 Type 2 diabetes mellitus with hyperglycemia: Principal | ICD-10-CM

## 2017-06-25 DIAGNOSIS — Z79899 Other long term (current) drug therapy: Secondary | ICD-10-CM | POA: Diagnosis not present

## 2017-06-25 DIAGNOSIS — B372 Candidiasis of skin and nail: Secondary | ICD-10-CM

## 2017-06-25 DIAGNOSIS — M48061 Spinal stenosis, lumbar region without neurogenic claudication: Secondary | ICD-10-CM | POA: Diagnosis not present

## 2017-06-25 DIAGNOSIS — Z87891 Personal history of nicotine dependence: Secondary | ICD-10-CM

## 2017-06-25 DIAGNOSIS — Z7984 Long term (current) use of oral hypoglycemic drugs: Secondary | ICD-10-CM

## 2017-06-25 DIAGNOSIS — M47816 Spondylosis without myelopathy or radiculopathy, lumbar region: Secondary | ICD-10-CM

## 2017-06-25 LAB — POCT GLYCOSYLATED HEMOGLOBIN (HGB A1C): Hemoglobin A1C: 7.3

## 2017-06-25 LAB — GLUCOSE, CAPILLARY: GLUCOSE-CAPILLARY: 194 mg/dL — AB (ref 65–99)

## 2017-06-25 MED ORDER — HYDROCODONE-ACETAMINOPHEN 7.5-325 MG PO TABS: 1.0000 | ORAL_TABLET | Freq: Three times a day (TID) | ORAL | 0 refills | Status: DC | PRN

## 2017-06-25 MED ORDER — ENALAPRIL MALEATE 5 MG PO TABS: 5.0000 mg | ORAL_TABLET | Freq: Every day | ORAL | 3 refills | Status: DC

## 2017-06-25 MED ORDER — NYSTATIN 100000 UNIT/GM EX POWD: Freq: Four times a day (QID) | CUTANEOUS | 5 refills | Status: DC

## 2017-06-25 MED ORDER — ACCU-CHEK GUIDE W/DEVICE KIT: 1.0000 | PACK | Freq: Every day | 0 refills | Status: DC

## 2017-06-25 MED ORDER — GLUCOSE BLOOD VI STRP: ORAL_STRIP | 5 refills | Status: DC

## 2017-06-25 MED ORDER — ACCU-CHEK FASTCLIX LANCETS MISC: 5 refills | Status: DC

## 2017-06-25 NOTE — Assessment & Plan Note (Signed)
Chronic pain generator is lumbar spine stenosis due to degenerative joint disease. Patient is doing fairly well on current dosing of hydrocodone. We went over her last urine tox screen, I was expecting the hydrocodone to be absent because she had run out of medications about 4 days prior to the visit. The patient does not admit to taking illicit temazepam or other benzo products. The alprazolam and zolpidem are prescribed by her psychiatrist. I warned the patient that if her urine tox screens continue to be inappropriate that we would not be able to continue with chronic opioid therapy. Patient understands. He tox obtained today. I reviewed the controlled substances reporting system which shows appropriate dispensing for the last 6 months. I provided the patient with 3 month supply of hydrocodone 7.5 mg 3 times a day.

## 2017-06-25 NOTE — Assessment & Plan Note (Signed)
A1c is 7.3%, at goal. Plan is to continue with metformin 1000 mg twice a day. Patient feels like she might be having some hypoglycemic events with sudden onset anxiety, sweaty and headache. She very much wants to be able to check her glucose at home. I tried to reassure her that this is very low risk. We will prescribe her supplies for home glucose monitoring once daily or if symptoms.

## 2017-06-25 NOTE — Telephone Encounter (Signed)
Asked by Dr. Oswaldo DoneVincent to assist patient in getting testing supplies to check her blood glucose at home. She requests prescriptions be sent to [facility] for mail order. She has [employer] primary and [place] Medicaid secondary insurance. both prefer Accu check meters. [facility] cannot tell me if they are in network with [facility] and  Medicaid. They request a prescription be sent.

## 2017-06-25 NOTE — Progress Notes (Signed)
   Assessment and Plan:  See Encounters tab for problem-based medical decision making.   __________________________________________________________  HPI:   56 year old woman here for follow-up of diabetes. Patient reports doing poorly right now. She says that her mood is depressed because this week is the one year anniversary of her mother's death. She lives with her sister who helps her with most of her activities of daily living. Patient is significantly disabled because of lumbar spinal stenosis. She walks with a rolling walker and needs help with most activities. She reports recently having a yeast infection, was seen at an urgent care last Friday and started on a course of antifungals. She reports still having significant tenderness but the discharge is improving. No fevers or chills. Denies seeing any vesicles. Eating and drinking well. She reports good compliance with her other medications without adverse side effects. Denies any falls since last seen. She does report an occasional sensation of hypoglycemia, says that she feels sweaty, shaky, headache, and anxious. She's tried checking her glucose with her brother's old glucometer, and says that the numbers range from 500 to 60.  __________________________________________________________  Problem List: Patient Active Problem List   Diagnosis Date Noted  . Chronic use of opiate for therapeutic purpose 02/08/2016    Priority: High  . Spinal stenosis of lumbar region 03/01/2011    Priority: High  . Diabetes mellitus type 2, uncontrolled (HCC) 07/06/2006    Priority: High  . Generalized anxiety disorder 07/06/2006    Priority: High  . AVN (avascular necrosis of bone) (HCC) 03/07/2013    Priority: Medium  . Hypothyroidism 07/06/2006    Priority: Medium  . Essential hypertension 07/06/2006    Priority: Medium  . Chronic venous insufficiency 02/26/2017    Priority: Low  . Candidal skin infection 03/03/2014    Priority: Low  .  Preventative health care 05/14/2013    Priority: Low  . Hyperlipemia 07/06/2006    Priority: Low  . GERD 07/06/2006    Priority: Low  . Vaginal yeast infection 05/31/2011    Medications: Reconciled today in Epic __________________________________________________________  Physical Exam:  Vital Signs: Vitals:   06/25/17 1044  BP: 139/75  Pulse: 85  SpO2: 100%  Weight: 219 lb (99.3 kg)    Gen: Depressed appearing woman, NAD CV: RRR, no murmurs Pulm: Normal effort, CTA throughout, no wheezing Abd: Soft, NT, ND, normal BS.  Genital: There is some intertrigo redness around the vagina, there is scant white discharge, no vesicles, on speculum exam she had significant tenderness, no vaginal wall lesion, cervix unable to be visualized. Ext: Warm, no edema, normal joints Skin: No atypical appearing moles. No rashes

## 2017-06-25 NOTE — Assessment & Plan Note (Signed)
Patient has a likely vaginal yeast infection right now. She is already on treatment with topical clotrimazole and systemic fluconazole prescribed by urgent care. She still having a lot of tenderness and mild discharge. I encouraged her to continue with the current antifungals. I think if her symptoms do not improve will refer her to gynecology.

## 2017-06-25 NOTE — Assessment & Plan Note (Signed)
Blood pressure is at goal today. Plan is to continue with enalapril 5 mg once daily.

## 2017-06-25 NOTE — Patient Instructions (Signed)
1. Continue the fluconazole as you are doing. If your pain is not better by the end of the week, call me and I will refer you to a gynecologist.

## 2017-06-27 MED ORDER — ACCU-CHEK GUIDE W/DEVICE KIT: 1.0000 | PACK | Freq: Every day | 0 refills | Status: DC

## 2017-06-27 MED ORDER — GLUCOSE BLOOD VI STRP: ORAL_STRIP | 5 refills | Status: DC

## 2017-06-27 MED ORDER — ACCU-CHEK FASTCLIX LANCETS MISC: 5 refills | Status: DC

## 2017-06-27 NOTE — Telephone Encounter (Signed)
Call to Ms. Gatliff to let her know prescriptions had been sent to [facility] at her request.  Call to [facility], they did not receive the prescriptions sent electronically and request they be sent by Fax. Prescriptions faxed.

## 2017-06-27 NOTE — Addendum Note (Signed)
Addended by: Mliss FritzKIM, Marisue Canion J on: 06/27/2017 10:01 AM   Modules accepted: Orders

## 2017-06-27 NOTE — Addendum Note (Signed)
Addended by: Baird CancerPLYLER, DONNA M on: 06/27/2017 10:00 AM   Modules accepted: Orders

## 2017-06-29 LAB — TOXASSURE SELECT,+ANTIDEPR,UR

## 2017-07-02 ENCOUNTER — Encounter (HOSPITAL_COMMUNITY): Payer: Self-pay | Admitting: Emergency Medicine

## 2017-07-02 ENCOUNTER — Ambulatory Visit (HOSPITAL_COMMUNITY)
Admission: EM | Admit: 2017-07-02 | Discharge: 2017-07-02 | Disposition: A | Discharge status: Home / Self Care | Payer: Medicare HMO | Attending: Family Medicine | Admitting: Family Medicine

## 2017-07-02 DIAGNOSIS — B373 Candidiasis of vulva and vagina: Secondary | ICD-10-CM | POA: Diagnosis not present

## 2017-07-02 DIAGNOSIS — B3731 Acute candidiasis of vulva and vagina: Secondary | ICD-10-CM

## 2017-07-02 DIAGNOSIS — N898 Other specified noninflammatory disorders of vagina: Secondary | ICD-10-CM | POA: Diagnosis present

## 2017-07-02 MED ORDER — HYDROXYZINE HCL 25 MG PO TABS: 25.0000 mg | ORAL_TABLET | Freq: Four times a day (QID) | ORAL | 0 refills | Status: DC

## 2017-07-02 MED ORDER — FLUCONAZOLE 150 MG PO TABS
150.0000 mg | ORAL_TABLET | Freq: Every day | ORAL | 0 refills | Status: DC
Start: 2017-07-02 — End: 2017-07-05

## 2017-07-02 MED ORDER — CLOTRIMAZOLE 1 % VA CREA: 1.0000 | TOPICAL_CREAM | Freq: Every day | VAGINAL | 1 refills | Status: DC

## 2017-07-02 NOTE — Telephone Encounter (Signed)
Called [facility]- they did not receive the prescriptions and say they cannot use them , so are faxing their form to complete.

## 2017-07-02 NOTE — ED Triage Notes (Signed)
Vaginal itching and irritation, medicine did help, but symptoms returned

## 2017-07-02 NOTE — ED Provider Notes (Signed)
  [facility]   [id number] 07/02/17 Arrival Time: 1441  ASSESSMENT & PLAN:  1. Vaginal candidiasis     Meds ordered this encounter  Medications  . clotrimazole (GYNE-LOTRIMIN) 1 % vaginal cream    Sig: Place 1 Applicatorful vaginally at bedtime.    Dispense:  30 g    Refill:  1    Order Specific Question:   Supervising Provider    Answer:   Mardella LaymanHAGLER, BRIAN [id number]]  . fluconazole (DIFLUCAN) 150 MG tablet    Sig: Take 1 tablet (150 mg total) by mouth daily.    Dispense:  5 tablet    Refill:  0    Order Specific Question:   Supervising Provider    Answer:   Mardella LaymanHAGLER, BRIAN [id number]]  . hydrOXYzine (ATARAX/VISTARIL) 25 MG tablet    Sig: Take 1 tablet (25 mg total) by mouth every 6 (six) hours.    Dispense:  12 tablet    Refill:  0    Order Specific Question:   Supervising Provider    Answer:   Mardella LaymanHAGLER, BRIAN [[id number]]   Vaginal Cytology - GC/Chlamydia Trich, cvag, bvag  Reviewed expectations re: course of current medical issues. Questions answered. Outlined signs and symptoms indicating need for more acute intervention. Patient verbalized understanding. After Visit Summary given.   SUBJECTIVE:  Mary Gray is a 56 y.o. female who presents with complaint of vaginal discharge.  ROS: As per HPI.   OBJECTIVE:  Vitals:   07/02/17 1530  BP: 128/79  Pulse: 95  Resp: (!) 22  Temp: 99 F (37.2 C)  TempSrc: Oral  SpO2: 97%     General appearance: alert; no distress HEENT: normocephalic; atraumatic;Lungs: clear to auscultation bilaterally Heart: regular rate and rhythm Abdomen: soft, non-tender; bowel sounds normal; no masses or organomegaly; no guarding or rebound tenderness Neurologic: normal symmetric reflexes; normal gait Psychological:  alert and cooperative; normal mood and affect  Results for orders placed or performed in visit on 06/25/17  Glucose, capillary  Result Value Ref Range   Glucose-Capillary 194 (H) 65 - 99 mg/dL  ToxAssure  Select,+Antidepr,UR  Result Value Ref Range   Summary FINAL   POC Hbg A1C  Result Value Ref Range   Hemoglobin A1C 7.3     Labs Reviewed  CERVICOVAGINAL ANCILLARY ONLY    No results found.    PMHx, SurgHx, SocialHx, Medications, and Allergies were reviewed in the Visit Navigator and updated as appropriate.      Deatra CanterOxford, Mary Fleisher J, FNP 07/02/17 1622

## 2017-07-03 DIAGNOSIS — F331 Major depressive disorder, recurrent, moderate: Secondary | ICD-10-CM | POA: Diagnosis not present

## 2017-07-04 LAB — CERVICOVAGINAL ANCILLARY ONLY
Bacterial vaginitis: NEGATIVE
Candida vaginitis: NEGATIVE
Chlamydia: NEGATIVE
Neisseria Gonorrhea: NEGATIVE
Trichomonas: NEGATIVE

## 2017-07-05 ENCOUNTER — Encounter (HOSPITAL_COMMUNITY): Payer: Self-pay | Admitting: Emergency Medicine

## 2017-07-05 ENCOUNTER — Ambulatory Visit (HOSPITAL_COMMUNITY)
Admission: EM | Admit: 2017-07-05 | Discharge: 2017-07-05 | Disposition: A | Discharge status: Home / Self Care | Payer: Medicare HMO | Attending: Family Medicine | Admitting: Family Medicine

## 2017-07-05 DIAGNOSIS — B356 Tinea cruris: Secondary | ICD-10-CM

## 2017-07-05 MED ORDER — FLUCONAZOLE 200 MG PO TABS: 200.0000 mg | ORAL_TABLET | Freq: Every day | ORAL | 0 refills | Status: AC

## 2017-07-05 MED ORDER — CLOTRIMAZOLE-BETAMETHASONE 1-0.05 % EX CREA: TOPICAL_CREAM | CUTANEOUS | 0 refills | Status: DC

## 2017-07-05 NOTE — ED Triage Notes (Signed)
PT was seen 8/3 and 8.13 for vaginal itching. PT was given cream, diflucan, and hydroxyzine 8/13. None of those meds have helped. PT denies discharge.

## 2017-07-08 NOTE — ED Provider Notes (Signed)
  [facility]   [id number] 07/05/17 Arrival Time: 1700  ASSESSMENT & PLAN:  1. Tinea cruris     Meds ordered this encounter  Medications  . fluconazole (DIFLUCAN) 200 MG tablet    Sig: Take 1 tablet (200 mg total) by mouth daily.    Dispense:  10 tablet    Refill:  0  . clotrimazole-betamethasone (LOTRISONE) cream    Sig: Apply to affected area 2 times daily for up to one week.    Dispense:  45 g    Refill:  0   This still looks like a fungal problem. Some aspect of irritant dermatitis. Meds above. Recommend dermatology evaluation if not improving. Reviewed expectations re: course of current medical issues. Questions answered. Outlined signs and symptoms indicating need for more acute intervention. Patient verbalized understanding. After Visit Summary given.   SUBJECTIVE:  Mary Gray is a 56 y.o. female who presents with complaint of continue vaginal irritation. Last note reviewed. External irritation. No vaginal discharge. Some pain externally with urination - from urine on skin. Afebrile.  ROS: As per HPI.   OBJECTIVE:  Vitals:   07/05/17 1719 07/05/17 1720 07/05/17 1723  BP:   136/89  Pulse:  (!) 103 (!) 103  Resp:  16 16  Temp:  98.5 F (36.9 C) 98.5 F (36.9 C)  TempSrc:  Oral Oral  SpO2:  97% 97%  Weight: 180 lb (81.6 kg)    Height: 5\' 1"  (1.549 m)       General appearance: alert; no distress Skin: external vaginal irritation/rash consistent with fungal etiology; satellite lesions surrounding; no ulceration; moist and beefy red; extends to bilateral thighs in symmetrical distribution Psychological:  alert and cooperative; normal mood and affect  Results for orders placed or performed during the hospital encounter of 07/02/17  Cervicovaginal ancillary only  Result Value Ref Range   Bacterial vaginitis Negative for Bacterial Vaginitis Microorganisms    Candida vaginitis Negative for Candida species    Chlamydia Negative    Neisseria  gonorrhea Negative    Trichomonas Negative        Past Medical History:  Diagnosis Date  . Chronic interstitial cystitis    Followed by Dr. Marcine Matar  . COPD (chronic obstructive pulmonary disease) (HCC)   . DDD (degenerative disc disease), lumbar   . Degeneration of cervical intervertebral disc   . Depression   . Diabetes mellitus, type 2 (HCC)   . Heme positive stool    EGD on 04/08/2010 by Dr. Ewing Schlein showed chronic gastritis and a few gastric polyps; pathology showed a fundic gland polyp.  Colonoscopy on 04/08/2010 showed small external and internal hemorrhoids, and a few benign-appearing diminutive polyps in the rectum, the distal sigmoid colon, and in the distal descending colon; pathology showed hyperplastic polyps.   . Hypertension   . Hypothyroidism   . Iron deficiency anemia   . Spinal stenosis      PMHx, SurgHx, SocialHx, Medications, and Allergies were reviewed in the Visit Navigator and updated as appropriate.      Mardella Layman, MD 07/08/17 [id number]

## 2017-07-09 ENCOUNTER — Encounter: Payer: Self-pay | Admitting: Dietician

## 2017-07-17 ENCOUNTER — Other Ambulatory Visit: Payer: Self-pay | Admitting: *Deleted

## 2017-07-17 MED ORDER — ESOMEPRAZOLE MAGNESIUM 40 MG PO CPDR: 40.0000 mg | DELAYED_RELEASE_CAPSULE | Freq: Every day | ORAL | 5 refills | Status: DC

## 2017-07-19 ENCOUNTER — Other Ambulatory Visit: Payer: Self-pay | Admitting: Student in an Organized Health Care Education/Training Program

## 2017-07-21 ENCOUNTER — Other Ambulatory Visit: Payer: Self-pay | Admitting: Internal Medicine

## 2017-07-29 ENCOUNTER — Other Ambulatory Visit: Payer: Self-pay | Admitting: Student in an Organized Health Care Education/Training Program

## 2017-07-29 DIAGNOSIS — I10 Essential (primary) hypertension: Secondary | ICD-10-CM

## 2017-07-31 NOTE — Telephone Encounter (Signed)
Attempted to contact patient regarding refill request from enalapril 2.5mg  once time daily.  Per pt's chart she should be taking 5mg  daily-request also came from a different pharmacy.  Attempted to contact patient to confirm current dose and pharmacy, no answer, unable to leave message.Kingsley SpittleGoldston, Amarilis Belflower Cassady9/11/20189:45 AM

## 2017-08-07 ENCOUNTER — Other Ambulatory Visit: Payer: Self-pay | Admitting: Student in an Organized Health Care Education/Training Program

## 2017-08-07 DIAGNOSIS — I10 Essential (primary) hypertension: Secondary | ICD-10-CM

## 2017-08-21 ENCOUNTER — Other Ambulatory Visit: Payer: Self-pay | Admitting: *Deleted

## 2017-08-21 DIAGNOSIS — B372 Candidiasis of skin and nail: Secondary | ICD-10-CM

## 2017-08-21 NOTE — Telephone Encounter (Signed)
Received faxed refill request from [facility] ([address]) for nystatin 100000 unit/gm powder-contacted pharmacy and pt has refills on file.  No further action needed, phone call complete.Mary Spittle Cassady10/2/20182:55 PM

## 2017-08-23 ENCOUNTER — Other Ambulatory Visit: Payer: Self-pay | Admitting: Student in an Organized Health Care Education/Training Program

## 2017-09-06 ENCOUNTER — Other Ambulatory Visit: Payer: Self-pay | Admitting: Student in an Organized Health Care Education/Training Program

## 2017-09-07 ENCOUNTER — Telehealth: Payer: Self-pay | Admitting: Student in an Organized Health Care Education/Training Program

## 2017-09-07 NOTE — Telephone Encounter (Addendum)
Gave dr vincent's answer to pt, she is not happy, she wants to see him before dec, states she needs to talk to someone over him because she cannot go w/o pain med. She was informed that dr Oswaldo Donevincent has the final say, she is advised that possibly getting a sm spiral notebook and keeping a record of when she leaves a script or scripts at the pharm and have them sign or initial and she sign or initial. Also suggested if she has looked everywhere at home to ask them if it could have been misfiled. These are only suggestions. Dr Oswaldo Donevincent would you see her before dec? You have no openings at this time

## 2017-09-07 NOTE — Telephone Encounter (Signed)
I hope this can be sorted out with Huntley DecSara. Can she find the script at home? I gave her three scripts in August, and as a general policy we do not provide replacements for lost or stolen medications or prescriptions.

## 2017-09-07 NOTE — Telephone Encounter (Signed)
Sure. Mary QuinYou may double book my 8:15 on either 11/5 or 11/12. Thanks.

## 2017-09-07 NOTE — Telephone Encounter (Signed)
Patient would like Mary Gray to callback, patient she was checking on my the pharmacy lost the hard copy of the medicine

## 2017-09-07 NOTE — Telephone Encounter (Signed)
Spoke w/ pt this am and pharmacy, pt states pharm lost her script for pain med, pharm states she only brought 2 scripts in to them, she wants a new script today

## 2017-09-07 NOTE — Telephone Encounter (Signed)
HYDROcodone-acetaminophen (NORCO) 7.5-325 MG tablet   Would like to know if Rx is ready. Please call pt back.

## 2017-09-07 NOTE — Telephone Encounter (Signed)
Checked [employer] database, only 2 from the date written have been recorded

## 2017-09-14 NOTE — Telephone Encounter (Signed)
I have left three messages for this patient to call back to schedule her 8:15 appointment with Dr. Oswaldo DoneVincent. Therefore patient has not been rescheduled for November but she does have a December appointment.

## 2017-09-19 ENCOUNTER — Other Ambulatory Visit: Payer: Self-pay | Admitting: Student in an Organized Health Care Education/Training Program

## 2017-09-25 ENCOUNTER — Ambulatory Visit: Payer: Medicare HMO | Admitting: Podiatry

## 2017-10-09 ENCOUNTER — Other Ambulatory Visit: Payer: Self-pay | Admitting: *Deleted

## 2017-10-09 DIAGNOSIS — M48061 Spinal stenosis, lumbar region without neurogenic claudication: Secondary | ICD-10-CM

## 2017-10-10 ENCOUNTER — Other Ambulatory Visit: Payer: Self-pay | Admitting: Student in an Organized Health Care Education/Training Program

## 2017-10-10 ENCOUNTER — Telehealth: Payer: Self-pay | Admitting: Student in an Organized Health Care Education/Training Program

## 2017-10-10 DIAGNOSIS — M48061 Spinal stenosis, lumbar region without neurogenic claudication: Secondary | ICD-10-CM

## 2017-10-10 MED ORDER — HYDROCODONE-ACETAMINOPHEN 7.5-325 MG PO TABS: 1.0000 | ORAL_TABLET | Freq: Three times a day (TID) | ORAL | 0 refills | Status: DC | PRN

## 2017-10-10 NOTE — Telephone Encounter (Signed)
Patient is calling regarding pain medicine

## 2017-10-10 NOTE — Telephone Encounter (Signed)
Called, got vmail. Left instructions

## 2017-10-15 ENCOUNTER — Other Ambulatory Visit: Payer: Self-pay

## 2017-10-15 NOTE — Telephone Encounter (Signed)
Would like to know if HYDROcodone-acetaminophen (NORCO) 7.5-325 MG tablet is ready for pick up. Please call pt back.

## 2017-10-15 NOTE — Telephone Encounter (Signed)
Call made to patient-Rx ready for pick up-She will send her sister Mary RuaCarla Garner to pick up.Kingsley SpittleGoldston, Joniyah Mallinger Cassady11/26/20182:38 PM

## 2017-10-21 ENCOUNTER — Other Ambulatory Visit: Payer: Self-pay | Admitting: Student in an Organized Health Care Education/Training Program

## 2017-10-21 DIAGNOSIS — I10 Essential (primary) hypertension: Secondary | ICD-10-CM

## 2017-10-24 DIAGNOSIS — F331 Major depressive disorder, recurrent, moderate: Secondary | ICD-10-CM | POA: Diagnosis not present

## 2017-10-29 ENCOUNTER — Ambulatory Visit: Payer: Medicare HMO | Admitting: Student in an Organized Health Care Education/Training Program

## 2017-10-31 ENCOUNTER — Other Ambulatory Visit: Payer: Self-pay | Admitting: Student in an Organized Health Care Education/Training Program

## 2017-11-08 ENCOUNTER — Other Ambulatory Visit: Payer: Self-pay | Admitting: Student in an Organized Health Care Education/Training Program

## 2017-11-08 DIAGNOSIS — M48061 Spinal stenosis, lumbar region without neurogenic claudication: Secondary | ICD-10-CM

## 2017-11-08 NOTE — Telephone Encounter (Signed)
Patient is requesting refill on pain medicine °

## 2017-11-09 ENCOUNTER — Other Ambulatory Visit: Payer: Self-pay | Admitting: Student in an Organized Health Care Education/Training Program

## 2017-11-09 MED ORDER — HYDROCODONE-ACETAMINOPHEN 7.5-325 MG PO TABS: 1.0000 | ORAL_TABLET | Freq: Three times a day (TID) | ORAL | 0 refills | Status: DC | PRN

## 2017-11-09 MED ORDER — HYDROCODONE-ACETAMINOPHEN 7.5-325 MG PO TABS
1.0000 | ORAL_TABLET | Freq: Three times a day (TID) | ORAL | 0 refills | Status: DC | PRN
Start: 2017-11-09 — End: 2017-11-09

## 2017-11-09 NOTE — Telephone Encounter (Signed)
[facility] and she may call them for pick up time, she was agreeable

## 2017-11-09 NOTE — Telephone Encounter (Signed)
Patient is calling back request back, requesting refills pain medicine
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
## 2017-11-20 ENCOUNTER — Other Ambulatory Visit: Payer: Self-pay | Admitting: Student in an Organized Health Care Education/Training Program

## 2017-12-31 ENCOUNTER — Encounter: Payer: Medicare HMO | Admitting: Student in an Organized Health Care Education/Training Program

## 2018-01-07 ENCOUNTER — Other Ambulatory Visit: Payer: Self-pay | Admitting: Student in an Organized Health Care Education/Training Program

## 2018-01-07 DIAGNOSIS — M48061 Spinal stenosis, lumbar region without neurogenic claudication: Secondary | ICD-10-CM

## 2018-01-07 NOTE — Telephone Encounter (Signed)
NEEDS REFILL ON PAIN MEDICATION, WILL BE OUT OF MEDS ON 01/13/18

## 2018-01-08 MED ORDER — HYDROCODONE-ACETAMINOPHEN 7.5-325 MG PO TABS: 1.0000 | ORAL_TABLET | Freq: Three times a day (TID) | ORAL | 0 refills | Status: DC | PRN
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
## 2018-01-20 ENCOUNTER — Other Ambulatory Visit: Payer: Self-pay | Admitting: Student in an Organized Health Care Education/Training Program

## 2018-01-25 DIAGNOSIS — F331 Major depressive disorder, recurrent, moderate: Secondary | ICD-10-CM | POA: Diagnosis not present

## 2018-01-27 ENCOUNTER — Other Ambulatory Visit: Payer: Self-pay | Admitting: Student in an Organized Health Care Education/Training Program

## 2018-01-28 ENCOUNTER — Encounter: Payer: Medicare HMO | Admitting: Student in an Organized Health Care Education/Training Program

## 2018-01-28 ENCOUNTER — Encounter: Payer: Self-pay | Admitting: Student in an Organized Health Care Education/Training Program

## 2018-02-11 ENCOUNTER — Telehealth: Payer: Self-pay

## 2018-02-11 DIAGNOSIS — M48061 Spinal stenosis, lumbar region without neurogenic claudication: Secondary | ICD-10-CM

## 2018-02-11 NOTE — Telephone Encounter (Signed)
Last refilled 01/07/2018. Per note from PCP at that time pt must see him in March or no further refills. Patient No Showed 01/28/2018 and letter was sent to her on that date regarding no show. Kinnie FeilL. Lashann Hagg, RN, BSN

## 2018-02-11 NOTE — Telephone Encounter (Signed)
HYDROcodone-acetaminophen (NORCO) 7.5-325 MG tablet   [facility] [address].

## 2018-02-11 NOTE — Telephone Encounter (Signed)
I am going to deny this hydrocodone refill. She has missed two consecutive appts with me, in Feb and March. I haven't seen her since August. I spoke with Tyler Aasoris who is going to follow up with her by phone to see what is going on. Needs to be seen and assessed before any further refills, and is at risk for being dismissed from our clinic.

## 2018-02-26 ENCOUNTER — Telehealth: Payer: Self-pay

## 2018-02-26 NOTE — Telephone Encounter (Signed)
Will speak w/ doriss.

## 2018-02-26 NOTE — Telephone Encounter (Signed)
Pt states she needs to speak with a nurse about pain med. Please call pt back.

## 2018-02-27 NOTE — Telephone Encounter (Signed)
I attempted to call patient several times the week of February 04, 2018. Patient never answered so I left a VM for patient to return the call to [phone] and speak with me.  The message I left was Dr. Oswaldo DoneVincent would like to see you before he can refill the meds. Patient did not call back until 02/11/18 inquiring about meds and was given an appointment at that time.

## 2018-02-28 ENCOUNTER — Other Ambulatory Visit: Payer: Self-pay | Admitting: *Deleted

## 2018-02-28 ENCOUNTER — Telehealth: Payer: Self-pay | Admitting: *Deleted

## 2018-02-28 NOTE — Telephone Encounter (Signed)
Yes  Please and thank you

## 2018-02-28 NOTE — Telephone Encounter (Signed)
Mary Gray. Was finally able to make contact w/ pt, she was then transferred to triage. Pt states her back is very bad at this time and she cannot wait until her appt w/ dr Oswaldo Donevincent 4/29 to have pain med prescribed. She would like to know if she can come in and see another physician to get pain med until her appt. Should we schedule for [facility] for pain medication r/t back pain?

## 2018-03-01 ENCOUNTER — Other Ambulatory Visit: Payer: Self-pay | Admitting: Student in an Organized Health Care Education/Training Program

## 2018-03-01 NOTE — Telephone Encounter (Signed)
Spoke w/ pt, scheduled appt [facility]

## 2018-03-05 MED ORDER — LEVOTHYROXINE SODIUM 88 MCG PO TABS: ORAL_TABLET | ORAL | 3 refills | Status: DC

## 2018-03-18 ENCOUNTER — Ambulatory Visit (INDEPENDENT_AMBULATORY_CARE_PROVIDER_SITE_OTHER): Payer: Medicare HMO | Admitting: Student in an Organized Health Care Education/Training Program

## 2018-03-18 ENCOUNTER — Encounter: Payer: Self-pay | Admitting: Dietician

## 2018-03-18 ENCOUNTER — Ambulatory Visit: Payer: Medicare HMO | Admitting: Dietician

## 2018-03-18 ENCOUNTER — Encounter: Payer: Self-pay | Admitting: Student in an Organized Health Care Education/Training Program

## 2018-03-18 ENCOUNTER — Other Ambulatory Visit: Payer: Self-pay | Admitting: Dietician

## 2018-03-18 ENCOUNTER — Telehealth: Payer: Self-pay | Admitting: Dietician

## 2018-03-18 ENCOUNTER — Encounter (INDEPENDENT_AMBULATORY_CARE_PROVIDER_SITE_OTHER): Payer: Self-pay

## 2018-03-18 VITALS — BP 132/60 | HR 98 | Temp 98.6°F | Wt 221.8 lb

## 2018-03-18 DIAGNOSIS — Z79899 Other long term (current) drug therapy: Secondary | ICD-10-CM | POA: Diagnosis not present

## 2018-03-18 DIAGNOSIS — E034 Atrophy of thyroid (acquired): Secondary | ICD-10-CM | POA: Diagnosis not present

## 2018-03-18 DIAGNOSIS — Z79891 Long term (current) use of opiate analgesic: Secondary | ICD-10-CM | POA: Diagnosis not present

## 2018-03-18 DIAGNOSIS — E1165 Type 2 diabetes mellitus with hyperglycemia: Secondary | ICD-10-CM | POA: Diagnosis not present

## 2018-03-18 DIAGNOSIS — B372 Candidiasis of skin and nail: Secondary | ICD-10-CM

## 2018-03-18 DIAGNOSIS — M48061 Spinal stenosis, lumbar region without neurogenic claudication: Secondary | ICD-10-CM

## 2018-03-18 DIAGNOSIS — Z79890 Hormone replacement therapy (postmenopausal): Secondary | ICD-10-CM

## 2018-03-18 DIAGNOSIS — Z7984 Long term (current) use of oral hypoglycemic drugs: Secondary | ICD-10-CM | POA: Diagnosis not present

## 2018-03-18 DIAGNOSIS — M47816 Spondylosis without myelopathy or radiculopathy, lumbar region: Secondary | ICD-10-CM | POA: Diagnosis not present

## 2018-03-18 DIAGNOSIS — E1140 Type 2 diabetes mellitus with diabetic neuropathy, unspecified: Secondary | ICD-10-CM

## 2018-03-18 DIAGNOSIS — I10 Essential (primary) hypertension: Secondary | ICD-10-CM | POA: Diagnosis not present

## 2018-03-18 DIAGNOSIS — G8929 Other chronic pain: Secondary | ICD-10-CM

## 2018-03-18 DIAGNOSIS — E039 Hypothyroidism, unspecified: Secondary | ICD-10-CM

## 2018-03-18 DIAGNOSIS — IMO0002 Reserved for concepts with insufficient information to code with codable children: Secondary | ICD-10-CM

## 2018-03-18 LAB — POCT GLYCOSYLATED HEMOGLOBIN (HGB A1C): Hemoglobin A1C: 9.5

## 2018-03-18 LAB — GLUCOSE, CAPILLARY: Glucose-Capillary: 271 mg/dL — ABNORMAL HIGH (ref 65–99)

## 2018-03-18 MED ORDER — ZOLPIDEM TARTRATE 10 MG PO TABS: 5.0000 mg | ORAL_TABLET | Freq: Every evening | ORAL | 0 refills | Status: DC | PRN

## 2018-03-18 MED ORDER — NYSTATIN 100000 UNIT/GM EX POWD: Freq: Four times a day (QID) | CUTANEOUS | 5 refills | Status: DC

## 2018-03-18 MED ORDER — HYDROCODONE-ACETAMINOPHEN 7.5-325 MG PO TABS: 1.0000 | ORAL_TABLET | Freq: Four times a day (QID) | ORAL | 0 refills | Status: DC | PRN

## 2018-03-18 MED ORDER — ACCU-CHEK GUIDE W/DEVICE KIT: 1.0000 | PACK | Freq: Every day | 0 refills | Status: DC

## 2018-03-18 MED ORDER — ALPRAZOLAM 1 MG PO TABS: 1.0000 mg | ORAL_TABLET | Freq: Four times a day (QID) | ORAL | 0 refills | Status: DC | PRN

## 2018-03-18 MED ORDER — ACCU-CHEK FASTCLIX LANCETS MISC
5 refills | Status: DC
Start: 2018-03-18 — End: 2020-06-14

## 2018-03-18 MED ORDER — GLUCOSE BLOOD VI STRP: ORAL_STRIP | 5 refills | Status: DC

## 2018-03-18 MED ORDER — HYDROCODONE-ACETAMINOPHEN 7.5-325 MG PO TABS: 1.0000 | ORAL_TABLET | Freq: Three times a day (TID) | ORAL | 0 refills | Status: DC | PRN

## 2018-03-18 NOTE — Progress Notes (Signed)
Medical Nutrition Therapy:  Appt start time: 1100 end time:  1200. Visit # 1  Assessment:  Primary concerns today: glycemic control and self monitoring "I don;t like it when my sugar is this high." Mary Gray is here with her family member for help with her meter and helping her to know what to do to help lower her blood sugar. She reports a low fat and low sugar diety at home. But does not limit starchy foods and is not aware of what food group have carbs/raise blood sugar. She asks for information about what she can drink other than diet pepsi. She asks about how stress affects blood sugars and reports increased stress lately. Preferred Learning Style: Auditory and Visual for meal planning and assistance with plan to help lower A1C, Hands on for meter teaching. CBG was high at today's visit and patient reports this is fasting.  Learning Readiness: Ready  And change in progress  ANTHROPOMETRICS: Estimated body mass index is 41.91 kg/m as calculated from the following:   Height as of 07/05/17:  (1.549 m).   Weight as of an earlier encounter on 03/18/18: 221 lb 12.8 oz (100.6 kg).  WEIGHT HISTORY: Highest:220s  Lowest- 180s- up and down between these numbers for past  Few years SLEEP:need to assess at future visit MEDICATIONS: metformin  Current Outpatient Medications on File Prior to Visit  Medication Sig Dispense Refill  . albuterol (PROAIR HFA) 108 (90 BASE) MCG/ACT inhaler Inhale 2 puffs into the lungs every 6 (six) hours as needed for wheezing or shortness of breath. 8.5 each 11  . ALPRAZolam (XANAX) 1 MG tablet Take 1 tablet (1 mg total) by mouth 4 (four) times daily as needed for anxiety. 30 tablet 0  . amitriptyline (ELAVIL) 25 MG tablet Take 1 tablet (25 mg total) by mouth at bedtime. (Patient taking differently: Take 50 mg by mouth at bedtime. ) 30 tablet 3  . enalapril (VASOTEC) 5 MG tablet Take 1 tablet (5 mg total) by mouth daily. 90 tablet 3  . esomeprazole (NEXIUM) 40 MG  capsule Take 1 capsule (40 mg total) by mouth daily. 30 capsule 5  . FLUoxetine (PROZAC) 20 MG capsule Take 20 mg by mouth daily.     . fluticasone (FLONASE) 50 MCG/ACT nasal spray USE 2 SPRAYS IN EACH NOSTRIL ONCE A DAY 16 g 5  . furosemide (LASIX) 40 MG tablet TAKE 1 TABLET BY MOUTH TWICE A DAY 60 tablet 0  . HYDROcodone-acetaminophen (NORCO) 7.5-325 MG tablet Take 1 tablet by mouth 3 (three) times daily as needed for moderate pain. 90 tablet 0  . HYDROcodone-acetaminophen (NORCO) 7.5-325 MG tablet Take 1 tablet by mouth every 6 (six) hours as needed for moderate pain. 90 tablet 0  . HYDROcodone-acetaminophen (NORCO) 7.5-325 MG tablet Take 1 tablet by mouth every 6 (six) hours as needed for moderate pain. 90 tablet 0  . ipratropium (ATROVENT HFA) 17 MCG/ACT inhaler Inhale 2 puffs into the lungs 4 (four) times daily as needed for wheezing. 12.9 g 6  . KLOR-CON M10 10 MEQ tablet TAKE 2 TABLETS BY MOUTH EVERY DAY 60 tablet 5  . lactulose (KRISTALOSE) 20 g packet Take 1 packet (20 g total) by mouth daily as needed (constipation). 30 each 5  . levocetirizine (XYZAL) 5 MG tablet TAKE 1 TABLET BY MOUTH EVERY EVENING 90 tablet 3  . levothyroxine (SYNTHROID) 88 MCG tablet TAKE 1 TABLET BY MOUTH EVERY DAY BEFORE BREAKFAST 90 tablet 3  . metFORMIN (GLUCOPHAGE-XR) 500 MG  24 hr tablet TAKE 2 TABLETS BY MOUTH TWICE A DAY 360 tablet 3  . nystatin (NYSTATIN) powder Apply topically 4 (four) times daily. 56.7 g 5  . pravastatin (PRAVACHOL) 10 MG tablet TAKE 1 TABLET (10 MG TOTAL) BY MOUTH EVERY EVENING. 90 tablet 3  . SYMBICORT 160-4.5 MCG/ACT inhaler TAKE 2 PUFFS BY MOUTH TWICE A DAY 30.6 Inhaler 1  . tamsulosin (FLOMAX) 0.4 MG CAPS capsule Take 0.4 mg by mouth daily.  3  . zolpidem (AMBIEN) 10 MG tablet Take 0.5 tablets (5 mg total) by mouth at bedtime as needed for sleep. 30 tablet 0   No current facility-administered medications on file prior to visit.    BMP Latest Ref Rng & Units 05/04/2017 04/09/2017  06/19/2016  Glucose 65 - 99 mg/dL 846(N) 96 629(B)  BUN 6 - 24 mg/dL Creatinine 0.57 - 1.00 mg/dL 2.84 1.32 4.40  BUN/Creat Ratio 9 - Sodium 134 - 144 mmol/L 137 137 142  Potassium 3.5 - 5.2 mmol/L 3.6 4.4 4.4  Chloride 96 - 106 mmol/L 90(L) 97 97  CO2 20 - 29 mmol/L 30(H) 26 20  Calcium 8.7 - 10.2 mg/dL 9.1 9.2 10.3(H)    BLOOD SUGAR: 300s today after only diet drink Lab Results  Component Value Date   HGBA1C 9.5 03/18/2018   DIETARY INTAKE: Usual eating pattern includes 2-3 meals and ? snacks per day. Everyday foods include oatmeal, grits, eggs, chicken baked.  .   24-hr recall:  B ( AM): grits, egg, toast D ( PM): baked chicken, starch, vegetable Beverages: diet pepsi, gatorade  Usual physical activity: limited  Progress Towards Goal(s):  In progress.   Nutritional Diagnosis:  [place]-2.2 Altered nutrition-related laboratory As related to elevated blood sugars.  As evidenced by increased A1C.    Intervention:  Nutrition education about how to use and interpret information from new meter- sample given today. Discussed plan to help her lower her A1c Coordination of care: requested testing supplies and referral for MNT & DSMT  Teaching Method Utilized: Visual, Auditory and Hands on Handouts given during visit include:meter my plate meal planning method, avs Barriers to learning/adherence to lifestyle change: competing values Demonstrated degree of understanding via:  Teach Back   Monitoring/Evaluation:  Dietary intake, exercise, meter, and body weight in 1 month(s). Norm Parcel, RD 03/18/2018 2:07 PM.

## 2018-03-18 NOTE — Progress Notes (Signed)
   Assessment and Plan:  See Encounters tab for problem-based medical decision making.   __________________________________________________________  HPI:   57 year old woman here for follow-up of lower back pain.  Patient has lumbar osteoarthritis especially at the L4-5 space.  I last saw the patient in August and we have been treating her with chronic opioids to improve her functional status for many years.  Few years ago made the transition from Dilaudid to [facility] which had been successful in controlling her symptoms.  Unfortunately over the last 5 months we have had difficulty following up with the patient in the office.  We had to cancel one appointment because of severe winter weather.  She canceled several other appointments after that because of transportation issues.  I have been refilling short-term over the phone but then had to stop a few months ago because of this lack of follow-up.  Her last refill was in mid February and she ran out of the medication totally over 2 weeks ago.  Since that time she reported increasing low back pain.  Decreasing functional status, says she now mostly just lays on the couch because of back pain.  Denies fevers or chills.  No changes in bowel or bladder functioning.  Denies any diaphoresis, diarrhea, jitteriness, agitation, or other symptoms of opioid withdrawal.  She reports good compliance with her other medications.  She brought in her pillbox today, there are numerous redundancies in some of her pill bottles.  She has a couple medications left over from several years ago.  She reports following up with her psychiatrist who recently tried her on mirtazapine but she had bad side effects and has discontinued.  She reports checking several blood sugars at home which have ranged over 200.  This is with a friend's meter, I am not sure what happened to her meter.  She is requesting diabetic shoes, since she was getting them for a while but then they stopped.  She  has followed up with podiatry and reports having calluses removed in the past.  __________________________________________________________  Problem List: Patient Active Problem List   Diagnosis Date Noted  . Chronic use of opiate for therapeutic purpose 02/08/2016    Priority: High  . Spinal stenosis of lumbar region 03/01/2011    Priority: High  . Diabetes mellitus type 2, uncontrolled (HCC) 07/06/2006    Priority: High  . Generalized anxiety disorder 07/06/2006    Priority: High  . AVN (avascular necrosis of bone) (HCC) 03/07/2013    Priority: Medium  . Hypothyroidism 07/06/2006    Priority: Medium  . Essential hypertension 07/06/2006    Priority: Medium  . Chronic venous insufficiency 02/26/2017    Priority: Low  . Candidal skin infection 03/03/2014    Priority: Low  . Preventative health care 05/14/2013    Priority: Low  . Hyperlipemia 07/06/2006    Priority: Low  . GERD 07/06/2006    Priority: Low    Medications: Reconciled today in Epic __________________________________________________________  Physical Exam:  Vital Signs: Vitals:   03/18/18 1041  BP: 132/60  Pulse: 98  Temp: 98.6 F (37 C)  TempSrc: Oral  Weight: 221 lb 12.8 oz (100.6 kg)    Gen:, Mildly depressed affect, chronically ill-appearing Neck: No cervical LAD, No thyromegaly or nodules, No JVD. CV: Tachycardic, no murmurs Pulm: Normal effort, CTA throughout, no wheezing Abd: Soft, NT, ND, normal BS.  Ext: Warm, no LE edema

## 2018-03-18 NOTE — Assessment & Plan Note (Signed)
Chronic lower back pain currently exacerbated because of interruption with opioid pain medications.  Currently reporting very low functional status.  No red flags.  Plan is to restart Norco 7.5/325 3 times daily.  Patient has no plans for back surgery in the future.  She benefited from this medication in the past with better functional status.  This interruption happened because she had difficulty following up with our clinic due to transportation issues which we both hope are now resolved.

## 2018-03-18 NOTE — Progress Notes (Signed)
Request DSMT & MNT referral for 2019.

## 2018-03-18 NOTE — Telephone Encounter (Signed)
Gave patient a new updated meter at today's visit. She requests prescriptions for the supplies for the meter to be sent to [facility].

## 2018-03-18 NOTE — Assessment & Plan Note (Signed)
Minor recurrent skin infection with no skin breakdown.  Refilled nystatin powder to be used as needed.

## 2018-03-18 NOTE — Patient Instructions (Signed)
We will restart your pain medications to try to improve your walking.   Please be sure to follow-up with me every 3 months.  Your diabetes is a little out of control right now.  I am hoping that if you can increase your exercise and walking your blood sugar control will improve.  We will recheck your A1c again in 3 months.  I will check your blood work today and send you the results later this week.

## 2018-03-18 NOTE — Assessment & Plan Note (Signed)
Historically well controlled but A1c now increased from 7.3% up to 9.5% today.  I think this is due to worsening functional status from the exacerbation of her lower back pain.  She is now almost completely sedentary.  We talked about increasing her activity, I am restarting her pain medications to this and.  Continue with metformin 2 g daily in divided doses.  Follow-up A1c in 3 months and if still elevated we will add another agent.

## 2018-03-18 NOTE — Assessment & Plan Note (Signed)
Blood pressure well controlled today.  Plan to continue with enalapril 5 mg once daily.  Check BMP.

## 2018-03-18 NOTE — Assessment & Plan Note (Signed)
We have had difficulties over the last few months getting Mary Gray to follow-up with [place] in the clinic.  We last saw her 8 months ago in August and gave her a 63-month supply of Norco.  We had to cancel her follow-up appointment because of severe winter weather.  She had a couple follow-up appointments that she canceled with [place] after that because of transportation issues.  I declined to refill her last prescription because she was not following up with [place].  Her last refill was in February and she reports running out of the medication about 2 weeks ago.  Denies withdrawal symptoms but does report worsening lumbar pain resulting in poor functional status.  Overall she was well controlled when she was on the medication.  She shows no signs of tolerance or dependence, no withdrawal when coming off the medication.  I think is fine to resume Norco 7.5 mg 3 times daily, #90 tablets for a 1 month supply.  I prescribed the patient 63-month supply.  We talked about the importance of following up with me every 3 months and making arrangements for transportation ahead of time.  Patient is agreeable.  Plan for a urine drug testing at next visit.  I reviewed the controlled substance reporting system today which was appropriate.

## 2018-03-18 NOTE — Patient Instructions (Addendum)
The plan to help lower your blood sugar:  1- eat balanced meals and snacks ( see handout of plate method of meal planning) 2- move as much as possible- walk in sunlight for a few minutes daily 3- check blood sugar daily  Bring meter to next visit

## 2018-03-18 NOTE — Assessment & Plan Note (Signed)
Of hyperthyroidism including sweating, heat intolerance, and tachycardia.  Is been 1 year since we last checked TSH which has historically been pretty well controlled.  Check TSH today.  Continue with Synthroid 88 mcg daily for now.

## 2018-03-19 ENCOUNTER — Encounter: Payer: Self-pay | Admitting: Student in an Organized Health Care Education/Training Program

## 2018-03-19 LAB — TSH: TSH: 3.69 u[IU]/mL (ref 0.450–4.500)

## 2018-03-19 LAB — BMP8+ANION GAP
ANION GAP: 18 mmol/L (ref 10.0–18.0)
BUN / CREAT RATIO: 19 (ref 9–23)
BUN: 14 mg/dL (ref 6–24)
CO2: 30 mmol/L — AB (ref 20–29)
Calcium: 9.8 mg/dL (ref 8.7–10.2)
Chloride: 89 mmol/L — ABNORMAL LOW (ref 96–106)
Creatinine, Ser: 0.75 mg/dL (ref 0.57–1.00)
GFR calc Af Amer: 102 mL/min/{1.73_m2} (ref >59)
GFR calc non Af Amer: 89 mL/min/{1.73_m2} (ref >59)
Glucose: 235 mg/dL — ABNORMAL HIGH (ref 65–99)
POTASSIUM: 3.8 mmol/L (ref 3.5–5.2)
SODIUM: 137 mmol/L (ref 134–144)

## 2018-03-28 ENCOUNTER — Telehealth: Payer: Self-pay | Admitting: Dietician

## 2018-03-28 ENCOUNTER — Encounter: Payer: Self-pay | Admitting: Dietician

## 2018-03-28 NOTE — Telephone Encounter (Signed)
Eating less carbs, no food today, blood sugar still high. It was 326 today. Last metformin dose was dinner last night. Has been taking meds as directed. Cutting back on carbs/food and snacks, only drinking water. Zero Yogurt, blueberries, light mangoes. P: advised her to drink plenty of water.  Transportation may be a problem to her coming in. recommend an appointment early next week. Will ask triage to call her back with an apopintment. She verbalized understanding to the plan.

## 2018-03-28 NOTE — Telephone Encounter (Signed)
I agree

## 2018-04-02 NOTE — Telephone Encounter (Signed)
Left messages on 2 different numbers requesting return call to schedule appt early next week. Kinnie Feil, RN, BSN

## 2018-04-04 DIAGNOSIS — F331 Major depressive disorder, recurrent, moderate: Secondary | ICD-10-CM | POA: Diagnosis not present

## 2018-04-10 ENCOUNTER — Telehealth: Payer: Self-pay | Admitting: Dietician

## 2018-04-10 NOTE — Telephone Encounter (Signed)
Patient calls saying her blood sugar is still high staying more than 200 mg/dl wondering if it is something she is eating or doing. would like a call back. Ate at the food bar yesterday and knows she shouldn't;t do that had - meatloaf, green beans, a little dab of sweet potatoes, banana pudding and water. She ate only greek light yogurt today, her sugar 203. Mostly drinking water, some G2. Moving more than she was in April. Transportation is a problem to going to a gym. Reviewed foods to eat, and encouraged physical activity as much as possible in and around her home(marching in place- 5 minute walks after meals).  Confirmed her appointment for next week and mailed her a list if "what foods to eat" by ADA.

## 2018-04-17 ENCOUNTER — Ambulatory Visit: Payer: Medicare HMO | Admitting: Dietician

## 2018-04-18 ENCOUNTER — Telehealth: Payer: Self-pay | Admitting: *Deleted

## 2018-04-18 NOTE — Telephone Encounter (Signed)
[facility] from [facility] stating insurance won't cover test strips from old directions. Requesting new Rx reflecting new instructions. Patient states she tests 4 times daily. Kinnie Feil, RN, BSN

## 2018-04-18 NOTE — Telephone Encounter (Signed)
Left patient a message that Medicare only pays for 1 strip unless on insulin. She is not in danger of low blood sugar so testing one time a day should be sufficient to let her know if her meal planning and walking/moving more plan per Dr. Oswaldo Done are helping to lower her blood sugars. I offered her an appointment tomorrow or early next week.

## 2018-04-19 NOTE — Telephone Encounter (Addendum)
Mary Gray called asking for change in strip prescription. Moving more, blood sugars were coming down 163- 273 before she ran out of strips. No more strips until June 7th. I explained Medicare rules for 1 strip/day for those not on insulin.   After our conversation today, she seemed more determined to work on lifestyle until she can get more strips. Sent inbasket to front office to schedule her 3 months follow up with Dr. Oswaldo Done on or after July 29th and also asked Dr. Oswaldo Done if he wants her to see a doctor for possible diabetes medication adjustment on June 12th.

## 2018-04-24 ENCOUNTER — Telehealth: Payer: Self-pay | Admitting: Dietician

## 2018-04-24 ENCOUNTER — Other Ambulatory Visit: Payer: Self-pay | Admitting: Student in an Organized Health Care Education/Training Program

## 2018-04-24 DIAGNOSIS — I10 Essential (primary) hypertension: Secondary | ICD-10-CM

## 2018-04-24 NOTE — Telephone Encounter (Signed)
Left a message on patient's voicemail to call the office to schedule [facility] per Dr. Oswaldo DoneVincent. He can see her when he is attending this week. She is to bring her logbook and meter.

## 2018-04-30 DIAGNOSIS — F5101 Primary insomnia: Secondary | ICD-10-CM | POA: Diagnosis not present

## 2018-04-30 DIAGNOSIS — F411 Generalized anxiety disorder: Secondary | ICD-10-CM | POA: Diagnosis not present

## 2018-04-30 DIAGNOSIS — F4312 Post-traumatic stress disorder, chronic: Secondary | ICD-10-CM | POA: Diagnosis not present

## 2018-04-30 DIAGNOSIS — F332 Major depressive disorder, recurrent severe without psychotic features: Secondary | ICD-10-CM | POA: Diagnosis not present

## 2018-04-30 DIAGNOSIS — G4700 Insomnia, unspecified: Secondary | ICD-10-CM | POA: Diagnosis not present

## 2018-05-01 ENCOUNTER — Ambulatory Visit (INDEPENDENT_AMBULATORY_CARE_PROVIDER_SITE_OTHER): Payer: Medicare HMO | Admitting: Internal Medicine

## 2018-05-01 ENCOUNTER — Encounter (INDEPENDENT_AMBULATORY_CARE_PROVIDER_SITE_OTHER): Payer: Self-pay

## 2018-05-01 ENCOUNTER — Ambulatory Visit (HOSPITAL_COMMUNITY)
Admission: RE | Admit: 2018-05-01 | Discharge: 2018-05-01 | Disposition: A | Discharge status: Home / Self Care | Payer: Medicare HMO | Source: Ambulatory Visit | Attending: Internal Medicine | Admitting: Internal Medicine

## 2018-05-01 ENCOUNTER — Other Ambulatory Visit: Payer: Self-pay

## 2018-05-01 ENCOUNTER — Ambulatory Visit (INDEPENDENT_AMBULATORY_CARE_PROVIDER_SITE_OTHER): Payer: Medicare HMO | Admitting: Dietician

## 2018-05-01 ENCOUNTER — Encounter: Payer: Self-pay | Admitting: Dietician

## 2018-05-01 VITALS — BP 162/72 | HR 107 | Temp 98.4°F | Ht 62.0 in | Wt 211.0 lb

## 2018-05-01 DIAGNOSIS — Z713 Dietary counseling and surveillance: Secondary | ICD-10-CM | POA: Diagnosis not present

## 2018-05-01 DIAGNOSIS — M1712 Unilateral primary osteoarthritis, left knee: Secondary | ICD-10-CM | POA: Diagnosis not present

## 2018-05-01 DIAGNOSIS — M549 Dorsalgia, unspecified: Secondary | ICD-10-CM

## 2018-05-01 DIAGNOSIS — G8929 Other chronic pain: Secondary | ICD-10-CM

## 2018-05-01 DIAGNOSIS — M25562 Pain in left knee: Secondary | ICD-10-CM

## 2018-05-01 DIAGNOSIS — E1165 Type 2 diabetes mellitus with hyperglycemia: Secondary | ICD-10-CM

## 2018-05-01 DIAGNOSIS — M1711 Unilateral primary osteoarthritis, right knee: Secondary | ICD-10-CM | POA: Insufficient documentation

## 2018-05-01 MED ORDER — DICLOFENAC SODIUM 1 % TD GEL: 2.0000 g | Freq: Four times a day (QID) | TRANSDERMAL | 2 refills | Status: DC

## 2018-05-01 MED ORDER — NAPROXEN 500 MG PO TABS: 500.0000 mg | ORAL_TABLET | Freq: Two times a day (BID) | ORAL | 0 refills | Status: DC

## 2018-05-01 NOTE — Assessment & Plan Note (Addendum)
Patient presenting with 3 months of left knee pain which has progressively worsened over the last 2 weeks has become unbearable.  She describes it as a sharp stabbing pain, which is worsened by weightbearing, flexion of the knee, and extension of the knee.  She states she feels like the knee is very unsteady and she has to hold on to things around her to prevent from falling.  She feels like her knee is going to give out.  The knee pain is also associated with muscle pains in her calf and thigh.  Patient takes 7.5 mg of Norco for chronic back pain.  She states this does make the pain more tolerable.  She has not tried anything else to relieve the pain.  She denies inciting event or recent trauma to the knee.  She denies swelling or redness to the knee.  She denies fevers or chills.  Denies nausea vomiting.  On exam, she she does not have significant swelling, erythema or warmth of the left knee.  She does have crepitus with extension.  Her range of motion is restricted due to pain.  No significant joint effusion on exam.  Patient's knee pain seems consistent with osteoarthritis.  I do not think her presentation is consistent with gout/pseudogout or septic joint.   Plan: -Will get left knee x-ray  -Naproxen 500 mg twice daily for approximately 4 weeks until her follow-up with PCP Dr. Oswaldo DoneVincent -Voltaren gel 4 times daily   Addendum 05/02/2018: Knee Xray with no fracture, dislocation, or knee joint effusion is identified. Mild medial compartment marginal osteophyte formation is noted with at most mild joint space narrowing. No significant lateral or patellofemoral compartment arthropathic changes are identified. Chronic medial metaphyseal tibial spurring is similar to the prior study. The soft tissues are unremarkable.

## 2018-05-01 NOTE — Progress Notes (Signed)
Medical Nutrition Therapy:  Appt start time: 1000 end time:  1100. Visit # 2  Assessment:  Primary concerns today: glycemic control and self monitoring  Mary Gray is here with her family member for help with her diabetes and meal planning. She lost 10.8# in the past 5-6 weeks. Her blood sugars are still above target but they are improving. She describes a healthy lower carb meal plan. Her questions show gained knowledge and interest.  She is thinking about trying to eat a third meal earlier in the day. She is still having problems moving more due to pain.  Advised how to use 7 strips /week by testing multiple times a day but every other day.  Learning Readiness:  change in progress  ANTHROPOMETRICS: Estimated body mass index is 38.59 kg/m as calculated from the following:   Height as of an earlier encounter on 05/01/18: 5' 2"  (1.575 m).   Weight as of an earlier encounter on 05/01/18: 211 lb (95.7 kg).   Wt Readings from Last 3 Encounters:  05/01/18 211 lb (95.7 kg)  03/18/18 221 lb 12.8 oz (100.6 kg)  07/05/17 180 lb (81.6 kg)   BP Readings from Last 3 Encounters:  05/01/18 (!) 162/72  03/18/18 132/60  07/05/17 136/89   SLEEP: okay- awakens during night MEDICATIONS: metformin - takes sometimes at noon and sometimes with dinner if she skips lunch  Current Outpatient Medications on File Prior to Visit  Medication Sig Dispense Refill  . ACCU-CHEK FASTCLIX LANCETS MISC Check blood sugar daily 102 each 5  . albuterol (PROAIR HFA) 108 (90 BASE) MCG/ACT inhaler Inhale 2 puffs into the lungs every 6 (six) hours as needed for wheezing or shortness of breath. 8.5 each 11  . ALPRAZolam (XANAX) 1 MG tablet Take 1 tablet (1 mg total) by mouth 4 (four) times daily as needed for anxiety. 30 tablet 0  . amitriptyline (ELAVIL) 25 MG tablet Take 1 tablet (25 mg total) by mouth at bedtime. (Patient taking differently: Take 50 mg by mouth at bedtime. ) 30 tablet 3  . Blood Glucose Monitoring Suppl  (ACCU-CHEK GUIDE) w/Device KIT 1 each by Does not apply route daily. 1 kit 0  . enalapril (VASOTEC) 5 MG tablet Take 1 tablet (5 mg total) by mouth daily. 90 tablet 3  . esomeprazole (NEXIUM) 40 MG capsule Take 1 capsule (40 mg total) by mouth daily. 30 capsule 5  . FLUoxetine (PROZAC) 20 MG capsule Take 20 mg by mouth daily.     . fluticasone (FLONASE) 50 MCG/ACT nasal spray USE 2 SPRAYS IN EACH NOSTRIL ONCE A DAY 16 g 5  . furosemide (LASIX) 40 MG tablet TAKE 1 TABLET BY MOUTH TWICE A DAY 60 tablet 0  . glucose blood (ACCU-CHEK GUIDE) test strip Check blood sugar daily 50 each 5  . HYDROcodone-acetaminophen (NORCO) 7.5-325 MG tablet Take 1 tablet by mouth 3 (three) times daily as needed for moderate pain. 90 tablet 0  . HYDROcodone-acetaminophen (NORCO) 7.5-325 MG tablet Take 1 tablet by mouth every 6 (six) hours as needed for moderate pain. 90 tablet 0  . HYDROcodone-acetaminophen (NORCO) 7.5-325 MG tablet Take 1 tablet by mouth every 6 (six) hours as needed for moderate pain. 90 tablet 0  . ipratropium (ATROVENT HFA) 17 MCG/ACT inhaler Inhale 2 puffs into the lungs 4 (four) times daily as needed for wheezing. 12.9 g 6  . KLOR-CON M10 10 MEQ tablet TAKE 2 TABLETS BY MOUTH EVERY DAY 60 tablet 5  . lactulose (KRISTALOSE) 20  g packet Take 1 packet (20 g total) by mouth daily as needed (constipation). 30 each 5  . levocetirizine (XYZAL) 5 MG tablet TAKE 1 TABLET BY MOUTH EVERY EVENING 90 tablet 3  . levothyroxine (SYNTHROID) 88 MCG tablet TAKE 1 TABLET BY MOUTH EVERY DAY BEFORE BREAKFAST 90 tablet 3  . metFORMIN (GLUCOPHAGE-XR) 500 MG 24 hr tablet TAKE 2 TABLETS BY MOUTH TWICE A DAY 360 tablet 3  . nystatin (NYSTATIN) powder Apply topically 4 (four) times daily. 56.7 g 5  . pravastatin (PRAVACHOL) 10 MG tablet TAKE 1 TABLET (10 MG TOTAL) BY MOUTH EVERY EVENING. 90 tablet 3  . SYMBICORT 160-4.5 MCG/ACT inhaler TAKE 2 PUFFS BY MOUTH TWICE A DAY 30.6 Inhaler 1  . tamsulosin (FLOMAX) 0.4 MG CAPS  capsule Take 0.4 mg by mouth daily.  3  . zolpidem (AMBIEN) 10 MG tablet Take 0.5 tablets (5 mg total) by mouth at bedtime as needed for sleep. 30 tablet 0   No current facility-administered medications on file prior to visit.    BMP Latest Ref Rng & Units 03/18/2018 05/04/2017 04/09/2017  Glucose 65 - 99 mg/dL 235(H) 188(H) 96  BUN 6 - 24 mg/dL 14 7 13   Creatinine 0.57 - 1.00 mg/dL 0.75 0.68 0.63  BUN/Creat Ratio 9 - 23 19 10 21   Sodium 134 - 144 mmol/L 137 137 137  Potassium 3.5 - 5.2 mmol/L 3.8 3.6 4.4  Chloride 96 - 106 mmol/L 89(L) 90(L) 97  CO2 20 - 29 mmol/L 30(H) 30(H) 26  Calcium 8.7 - 10.2 mg/dL 9.8 9.1 9.2    BLOOD SUGAR: blood sugar per meter download show lower blood sugar in daytime and higher overnight indicating significant insulin resistance. Her average is ~ 200 which is improved, but not at target.   DIETARY INTAKE: Usual eating pattern includes 2-3 meals and 1-2 snacks per day. Everyday foods include oatmeal, grits, eggs, chicken baked.  .   24-hr recall:  B ( 11AM - 2 PM): grits, egg, toast or oatmeal but mostly zero yogurt with berries D ( 6 PM): baked salmon, broccoli and cheese 8 Pm popcorn, sherbet, ice cream bar Beverages: diet pepsi, water, v-8 with fiber  Progress Towards Goal(s):  Some progress.   Nutritional Diagnosis:  [place]-2.2 Altered nutrition-related laboratory As related to elevated blood sugars appears to be gradually improving.  As evidenced by decreased blood sugars- A1C should be repeated in July    Intervention:  Nutrition education about 1200 calorie, carb consistent meal plan, label reading.Insulin resistance and importance of activity to decrease it.  Discussed plan to help her lower her A1c Coordination of care: consider adding SGLT2 to metformin if needed   Teaching Method Utilized: Visual, Auditory and Hands on Handouts given during visit include:meter my plate meal planning method, avs Barriers to learning/adherence to lifestyle change:  competing values Demonstrated degree of understanding via:  Teach Back   Monitoring/Evaluation:  Dietary intake, exercise, meter, and body weight in 3 week(s). Debera Lat, RD 05/01/2018 12:21 PM.

## 2018-05-01 NOTE — Progress Notes (Signed)
   CC: Left knee pain  HPI:  [name] is a 57 y.o. female with a past medical history listed below presenting with left knee pain.  Please see encounter based charting for detailed description of the patient's acute on chronic medical problems.  Past Medical History:  Diagnosis Date  . Chronic interstitial cystitis    Followed by Dr. Marcine MatarStephen Dahlstedt  . COPD (chronic obstructive pulmonary disease) (HCC)   . DDD (degenerative disc disease), lumbar   . Degeneration of cervical intervertebral disc   . Depression   . Diabetes mellitus, type 2 (HCC) 2007  . Heme positive stool    EGD on 04/08/2010 by Dr. Ewing SchleinMagod showed chronic gastritis and a few gastric polyps; pathology showed a fundic gland polyp.  Colonoscopy on 04/08/2010 showed small external and internal hemorrhoids, and a few benign-appearing diminutive polyps in the rectum, the distal sigmoid colon, and in the distal descending colon; pathology showed hyperplastic polyps.   . Hypertension   . Hypothyroidism   . Iron deficiency anemia   . Spinal stenosis    Review of Systems:   Review of Systems  Constitutional: Negative for chills and fever.  Gastrointestinal: Negative for nausea and vomiting.  Musculoskeletal: Positive for back pain and joint pain. Negative for falls.  Neurological: Negative for sensory change.    Physical Exam:  Vitals:   05/01/18 0900  BP: (!) 162/72  Pulse: (!) 107  Temp: 98.4 F (36.9 C)  TempSrc: Oral  SpO2: 98%  Weight: 211 lb (95.7 kg)  Height: 5\' 2"  (1.575 m)   Physical Exam  Constitutional: She is oriented to person, place, and time. She appears well-developed and well-nourished. No distress.  HENT:  Head: Normocephalic and atraumatic.  Eyes: Conjunctivae are normal. No scleral icterus.  Cardiovascular: Normal rate, regular rhythm and normal heart sounds.  Pulmonary/Chest: Effort normal and breath sounds normal.  Musculoskeletal: She exhibits no edema.       Left knee: She  exhibits decreased range of motion (decreased flexion secondary to pain). She exhibits no swelling, no effusion, no ecchymosis, no deformity, no erythema and normal alignment. Tenderness found. Medial joint line tenderness noted.  Neurological: She is alert and oriented to person, place, and time.  Skin: Skin is warm and dry.    Assessment & Plan:   See Encounters Tab for problem based charting.  Patient discussed with Dr. Sandre Kittyaines

## 2018-05-01 NOTE — Patient Instructions (Addendum)
Diabetes Latest Ref Rng & Units 05/01/2018 03/18/2018 03/18/2018  Wt (Lbs.)  211  221.8    Great job with weight loss!  Reminder- Bring fruit bar label package so we can look at it.   Try whole wheat english muffin, rye bread or pumpernickel.  Adding cottage cheese to fruit cups.    Sample 1,200 Calorie Meal Plan  Breakfast:  2 1/2 carbohydrate (1 bread/starch, 1 fruit, 1/2 milk) 1 protein 1 fat   Total Carbohydrates: 37 grams       Lunch:  2 1/3 carbohydrate (1 bread/starch, 1 fruit, 1 vegetable) 2 protein 1 fat   Total Carbohydrates: 30-35 grams       Dinner:  3 1/2 carbohydrate (2 bread/starch, 1 fruit, 2 vegetable) 2 protein 2 fat   Total Carbohydrates: 45-55 grams       Afternoon OR Evening Snack:  1 1/2 carbohydrate (1 bread/starch, 1/2 milk)   Total Carbohydrates: 22 grams

## 2018-05-01 NOTE — Patient Instructions (Addendum)
Mary Gray,   It was nice meeting you today.   I believe your knee pain is due to osteoarthritis.  I have prescribed you medication called naproxen.  Take 500 mg of naproxen twice daily for about a month duration until you follow-up with Dr. Oswaldo DoneVincent.  I have also prescribed you Voltaren gel.  You can apply this topically to your knee up to 4 times daily.  I also think we should get x-rays of your left knee and I will call you with results.

## 2018-05-01 NOTE — Progress Notes (Signed)
[facility] Attending  Case discussed with Dr. LaCroce  at the time of the visit.  We reviewed the resident's history and exam and pertinent patient test results.  I agree with the assessment, diagnosis, and plan of care documented in the resident's note.  Alexander N Raines, MD   

## 2018-05-03 ENCOUNTER — Telehealth: Payer: Self-pay | Admitting: Student in an Organized Health Care Education/Training Program

## 2018-05-03 NOTE — Telephone Encounter (Signed)
Patient is calling about xray results.

## 2018-05-03 NOTE — Telephone Encounter (Signed)
Tried calling patient back to discuss xray results. No answer.

## 2018-05-04 ENCOUNTER — Other Ambulatory Visit: Payer: Self-pay | Admitting: Internal Medicine

## 2018-05-06 ENCOUNTER — Telehealth: Payer: Self-pay | Admitting: Student in an Organized Health Care Education/Training Program

## 2018-05-06 NOTE — Telephone Encounter (Signed)
Called patient back, no answer. Left message. Will try calling back again tomorrow.

## 2018-05-06 NOTE — Telephone Encounter (Signed)
Patient calling about xray results, pls call back

## 2018-05-07 ENCOUNTER — Other Ambulatory Visit: Payer: Self-pay | Admitting: Internal Medicine

## 2018-05-07 DIAGNOSIS — M25562 Pain in left knee: Secondary | ICD-10-CM

## 2018-05-07 DIAGNOSIS — E119 Type 2 diabetes mellitus without complications: Secondary | ICD-10-CM | POA: Diagnosis not present

## 2018-05-07 NOTE — Telephone Encounter (Signed)
Discussed left knee xray with patient, which showed mild joint space narrowing and osteoarthrosis. She states she has been having leg cramps associated with the knee pain and is requesting a knee brace rx because she can't afford the OTC knee brace. Discussed potential benefits of physical therapy. The patient is amenable. Have placed referral.

## 2018-05-07 NOTE — Telephone Encounter (Signed)
Please call patient back, patient is calling back regarding xray.

## 2018-05-07 NOTE — Telephone Encounter (Signed)
This was addressed in separate phone encounter on 05/06/2018. Kinnie FeilL. Ducatte, RN, BSN

## 2018-05-14 DIAGNOSIS — R3914 Feeling of incomplete bladder emptying: Secondary | ICD-10-CM | POA: Diagnosis not present

## 2018-05-15 ENCOUNTER — Other Ambulatory Visit: Payer: Self-pay | Admitting: Student in an Organized Health Care Education/Training Program

## 2018-05-15 NOTE — Telephone Encounter (Signed)
Patient is requesting refill on her pain medicine, she said the pharmacy said that they doesn't have an hard copy of the prescription   If you have any questions call pt

## 2018-05-15 NOTE — Telephone Encounter (Signed)
I called [facility] - at first, stated pt does not have another Hydrocodone rx; she checked again, found rx which can be refilled on 6/27. Called pt - no answer; left message to call us back.

## 2018-05-16 ENCOUNTER — Ambulatory Visit (INDEPENDENT_AMBULATORY_CARE_PROVIDER_SITE_OTHER): Payer: Medicare HMO | Admitting: Dietician

## 2018-05-16 ENCOUNTER — Telehealth: Payer: Self-pay | Admitting: Student in an Organized Health Care Education/Training Program

## 2018-05-16 DIAGNOSIS — Z6838 Body mass index (BMI) 38.0-38.9, adult: Secondary | ICD-10-CM | POA: Diagnosis not present

## 2018-05-16 DIAGNOSIS — Z713 Dietary counseling and surveillance: Secondary | ICD-10-CM | POA: Diagnosis not present

## 2018-05-16 DIAGNOSIS — E118 Type 2 diabetes mellitus with unspecified complications: Secondary | ICD-10-CM

## 2018-05-16 DIAGNOSIS — E1165 Type 2 diabetes mellitus with hyperglycemia: Secondary | ICD-10-CM

## 2018-05-16 NOTE — Progress Notes (Signed)
Medical Nutrition Therapy:  Appt start time: 1020 end time:  1120. Visit # 3  Assessment:  Primary concerns today: glycemic control and self monitoring  Ms. Rawlinson is here with her family member for help with her diabetes and meal planning. She lost 13# in the past 7-8 weeks. Her blood sugars are still above target but they are improving. She describes a healthy lower carb meal plan. Her questions show gained knowledge and interest. She is trying to move more, but still having problems due to pain. Her goal weight is 130#, but we discussed and she was willing to set smaller goals to begin with and get to her goal gradually.  Learning Readiness:  change in progress  ANTHROPOMETRICS: Estimated body mass index is 38.19 kg/m as calculated from the following:   Height as of 05/01/18: _0  (1.575 m).   Weight as of this encounter: 208 lb 12.8 oz (94.7 kg).   Wt Readings from Last 3 Encounters:  05/16/18 208 lb 12.8 oz (94.7 kg)  05/01/18 211 lb (95.7 kg)  03/18/18 221 lb 12.8 oz (100.6 kg)    Current Outpatient Medications on File Prior to Visit  Medication Sig Dispense Refill  . ACCU-CHEK FASTCLIX LANCETS MISC Check blood sugar daily 102 each 5  . albuterol (PROAIR HFA) 108 (90 BASE) MCG/ACT inhaler Inhale 2 puffs into the lungs every 6 (six) hours as needed for wheezing or shortness of breath. 8.5 each 11  . ALPRAZolam (XANAX) 1 MG tablet Take 1 tablet (1 mg total) by mouth 4 (four) times daily as needed for anxiety. 30 tablet 0  . amitriptyline (ELAVIL) 25 MG tablet Take 1 tablet (25 mg total) by mouth at bedtime. (Patient taking differently: Take 50 mg by mouth at bedtime. ) 30 tablet 3  . Blood Glucose Monitoring Suppl (ACCU-CHEK GUIDE) w/Device KIT 1 each by Does not apply route daily. 1 kit 0  . diclofenac sodium (VOLTAREN) 1 % GEL Apply 2 g topically 4 (four) times daily. 1 Tube 2  . enalapril (VASOTEC) 5 MG tablet Take 1 tablet (5 mg total) by mouth daily. 90 tablet 3  . esomeprazole  (NEXIUM) 40 MG capsule Take 1 capsule (40 mg total) by mouth daily. 30 capsule 5  . FLUoxetine (PROZAC) 20 MG capsule Take 20 mg by mouth daily.     . fluticasone (FLONASE) 50 MCG/ACT nasal spray USE 2 SPRAYS IN EACH NOSTRIL ONCE A DAY 16 g 5  . furosemide (LASIX) 40 MG tablet TAKE 1 TABLET BY MOUTH TWICE A DAY 60 tablet 6  . glucose blood (ACCU-CHEK GUIDE) test strip Check blood sugar daily 50 each 5  . HYDROcodone-acetaminophen (NORCO) 7.5-325 MG tablet Take 1 tablet by mouth 3 (three) times daily as needed for moderate pain. 90 tablet 0  . HYDROcodone-acetaminophen (NORCO) 7.5-325 MG tablet Take 1 tablet by mouth every 6 (six) hours as needed for moderate pain. 90 tablet 0  . HYDROcodone-acetaminophen (NORCO) 7.5-325 MG tablet Take 1 tablet by mouth every 6 (six) hours as needed for moderate pain. 90 tablet 0  . ipratropium (ATROVENT HFA) 17 MCG/ACT inhaler Inhale 2 puffs into the lungs 4 (four) times daily as needed for wheezing. 12.9 g 6  . KLOR-CON M10 10 MEQ tablet TAKE 2 TABLETS BY MOUTH EVERY DAY 60 tablet 5  . lactulose (KRISTALOSE) 20 g packet Take 1 packet (20 g total) by mouth daily as needed (constipation). 30 each 5  . levocetirizine (XYZAL) 5 MG tablet TAKE 1 TABLET  BY MOUTH EVERY EVENING 90 tablet 3  . levothyroxine (SYNTHROID) 88 MCG tablet TAKE 1 TABLET BY MOUTH EVERY DAY BEFORE BREAKFAST 90 tablet 3  . metFORMIN (GLUCOPHAGE-XR) 500 MG 24 hr tablet TAKE 2 TABLETS BY MOUTH TWICE A DAY 360 tablet 3  . naproxen (NAPROSYN) 500 MG tablet Take 1 tablet (500 mg total) by mouth 2 (two) times daily with a meal. 60 tablet 0  . nystatin (NYSTATIN) powder Apply topically 4 (four) times daily. 56.7 g 5  . pravastatin (PRAVACHOL) 10 MG tablet TAKE 1 TABLET (10 MG TOTAL) BY MOUTH EVERY EVENING. 90 tablet 3  . SYMBICORT 160-4.5 MCG/ACT inhaler TAKE 2 PUFFS BY MOUTH TWICE A DAY 30.6 Inhaler 1  . tamsulosin (FLOMAX) 0.4 MG CAPS capsule Take 0.4 mg by mouth daily.  3  . zolpidem (AMBIEN) 10 MG  tablet Take 0.5 tablets (5 mg total) by mouth at bedtime as needed for sleep. 30 tablet 0   No current facility-administered medications on file prior to visit.     BLOOD SUGAR: blood sugar per meter shows decreasing blood sugar, but still not at target. Her average for past 7 days- 167 Past 14 days 171 Past 30 days 170,  past 90 days 199 is better compared to last visit (200 mg/dl).   DIETARY INTAKE: Usual eating pattern includes 2-3 meals and 1-2 snacks per day. Everyday foods include oatmeal, grits, eggs, chicken baked, yogurt, vegetables.    24-hr recall:  B ( 11AM - 2 PM):Coffee, boost, activia D ( 6 PM): baked salmon, tomatoes, cucumbers in light [place] dressing 8 Pm popcorn Beverages:  water, v-8 with fiber  Progress Towards Goal(s):  Some progress.   Nutritional Diagnosis:  [place]-2.2 Altered nutrition-related laboratory As related to elevated blood sugars appears to be gradually improving.  As evidenced by decreased blood sugars- A1C should be repeated and improving in July    Intervention:  Nutrition education about diabetes medicine and their effect on weight label reading.Insulin resistance and importance of activity to decrease it.  Discussed plan to help her lower her A1c Coordination of care: consider adding SGLT2 or GLP-1 if patient agrees to metformin if needed   Teaching Method Utilized: Visual, Auditory and Hands on Handouts given during visit include:meter my plate meal planning method, avs Barriers to learning/adherence to lifestyle change: competing values Demonstrated degree of understanding via:  Teach Back   Monitoring/Evaluation:  Dietary intake, exercise, meter, and body weight in 4 week(s). Butch Penny Zoltan Genest, RD 05/16/2018 11:20 AM.

## 2018-05-16 NOTE — Telephone Encounter (Signed)
Patient is calling back regarding medicine.  Pls call patient back

## 2018-05-16 NOTE — Telephone Encounter (Signed)
Patient is calling back regarding medicine °

## 2018-05-16 NOTE — Telephone Encounter (Signed)
[facility] with [facility] at [facility], states they do have Rx. They hadn't scrolled back far enough in their system to see that it had been sent in on 03/18/2018. Also insurance was requiring override 2/2 patient also being on alprazolam. Assured Tammy PCP was aware as he wrote both Rxs on 03/18/2018. She will get refill ready. Patient should be able to pick up in one hour. Message left on patient's VM to call [facility] in one hour to ensure it is ready. Kinnie FeilL. Matthieu Loftus, RN, BSN

## 2018-05-16 NOTE — Patient Instructions (Addendum)
Healthy snacks- yogurt, fruit, veggies like your cucumbers or milk or SOY MILK or nuts  Keep trying to move more- can try to do stretching or seated exercise when leg hurts and you are correct  walking or moving in a pool is great!  Your blood sugar is improving. Keep checking your AVERAGES on your meter.   Keep eating healthy- you are doing a really good job with that!

## 2018-05-16 NOTE — Telephone Encounter (Signed)
Patient is requesting pain medicine, patient came today and Lupita LeashDonna check with the nurse and [facility]  pls call patient

## 2018-05-21 ENCOUNTER — Ambulatory Visit: Payer: Medicare HMO | Attending: Internal Medicine

## 2018-05-21 NOTE — Addendum Note (Signed)
Addended by: Neomia DearPOWERS, Kendahl Bumgardner E on: 05/21/2018 04:55 PM   Modules accepted: Orders

## 2018-05-28 ENCOUNTER — Other Ambulatory Visit: Payer: Self-pay | Admitting: Internal Medicine

## 2018-05-30 DIAGNOSIS — F332 Major depressive disorder, recurrent severe without psychotic features: Secondary | ICD-10-CM | POA: Diagnosis not present

## 2018-05-30 DIAGNOSIS — F411 Generalized anxiety disorder: Secondary | ICD-10-CM | POA: Diagnosis not present

## 2018-05-30 DIAGNOSIS — F5101 Primary insomnia: Secondary | ICD-10-CM | POA: Diagnosis not present

## 2018-05-30 DIAGNOSIS — F4312 Post-traumatic stress disorder, chronic: Secondary | ICD-10-CM | POA: Diagnosis not present

## 2018-06-05 DIAGNOSIS — R3914 Feeling of incomplete bladder emptying: Secondary | ICD-10-CM | POA: Diagnosis not present

## 2018-06-05 DIAGNOSIS — N139 Obstructive and reflux uropathy, unspecified: Secondary | ICD-10-CM | POA: Diagnosis not present

## 2018-06-10 ENCOUNTER — Ambulatory Visit (HOSPITAL_COMMUNITY)
Admission: RE | Admit: 2018-06-10 | Discharge: 2018-06-10 | Disposition: A | Discharge status: Home / Self Care | Payer: Medicare HMO | Source: Ambulatory Visit | Attending: Student in an Organized Health Care Education/Training Program | Admitting: Student in an Organized Health Care Education/Training Program

## 2018-06-10 ENCOUNTER — Telehealth: Payer: Self-pay | Admitting: Dietician

## 2018-06-10 ENCOUNTER — Other Ambulatory Visit: Payer: Self-pay | Admitting: Dietician

## 2018-06-10 ENCOUNTER — Encounter: Payer: Self-pay | Admitting: Student in an Organized Health Care Education/Training Program

## 2018-06-10 ENCOUNTER — Ambulatory Visit (INDEPENDENT_AMBULATORY_CARE_PROVIDER_SITE_OTHER): Payer: Medicare HMO | Admitting: Student in an Organized Health Care Education/Training Program

## 2018-06-10 ENCOUNTER — Ambulatory Visit (INDEPENDENT_AMBULATORY_CARE_PROVIDER_SITE_OTHER): Payer: Medicare HMO | Admitting: Dietician

## 2018-06-10 VITALS — BP 125/67 | HR 89 | Temp 98.3°F

## 2018-06-10 VITALS — Wt 202.7 lb

## 2018-06-10 DIAGNOSIS — I10 Essential (primary) hypertension: Secondary | ICD-10-CM

## 2018-06-10 DIAGNOSIS — Z6837 Body mass index (BMI) 37.0-37.9, adult: Secondary | ICD-10-CM

## 2018-06-10 DIAGNOSIS — E1169 Type 2 diabetes mellitus with other specified complication: Secondary | ICD-10-CM

## 2018-06-10 DIAGNOSIS — M25572 Pain in left ankle and joints of left foot: Secondary | ICD-10-CM | POA: Insufficient documentation

## 2018-06-10 DIAGNOSIS — Z713 Dietary counseling and surveillance: Secondary | ICD-10-CM | POA: Diagnosis not present

## 2018-06-10 DIAGNOSIS — Z79891 Long term (current) use of opiate analgesic: Secondary | ICD-10-CM | POA: Diagnosis not present

## 2018-06-10 DIAGNOSIS — G4700 Insomnia, unspecified: Secondary | ICD-10-CM | POA: Diagnosis not present

## 2018-06-10 DIAGNOSIS — M48061 Spinal stenosis, lumbar region without neurogenic claudication: Secondary | ICD-10-CM

## 2018-06-10 DIAGNOSIS — G8929 Other chronic pain: Secondary | ICD-10-CM | POA: Insufficient documentation

## 2018-06-10 DIAGNOSIS — E1165 Type 2 diabetes mellitus with hyperglycemia: Secondary | ICD-10-CM

## 2018-06-10 DIAGNOSIS — E1140 Type 2 diabetes mellitus with diabetic neuropathy, unspecified: Secondary | ICD-10-CM | POA: Diagnosis not present

## 2018-06-10 DIAGNOSIS — M545 Low back pain: Secondary | ICD-10-CM

## 2018-06-10 DIAGNOSIS — R2681 Unsteadiness on feet: Secondary | ICD-10-CM

## 2018-06-10 DIAGNOSIS — M1712 Unilateral primary osteoarthritis, left knee: Secondary | ICD-10-CM | POA: Diagnosis not present

## 2018-06-10 DIAGNOSIS — Z7984 Long term (current) use of oral hypoglycemic drugs: Secondary | ICD-10-CM

## 2018-06-10 DIAGNOSIS — R937 Abnormal findings on diagnostic imaging of other parts of musculoskeletal system: Secondary | ICD-10-CM | POA: Insufficient documentation

## 2018-06-10 DIAGNOSIS — Z79899 Other long term (current) drug therapy: Secondary | ICD-10-CM

## 2018-06-10 LAB — POCT GLYCOSYLATED HEMOGLOBIN (HGB A1C): HEMOGLOBIN A1C: 7.3 % — AB (ref 4.0–5.6)

## 2018-06-10 LAB — GLUCOSE, CAPILLARY: Glucose-Capillary: 167 mg/dL — ABNORMAL HIGH (ref 70–99)

## 2018-06-10 MED ORDER — ENALAPRIL MALEATE 5 MG PO TABS: 5.0000 mg | ORAL_TABLET | Freq: Every day | ORAL | 3 refills | Status: DC

## 2018-06-10 MED ORDER — HYDROCODONE-ACETAMINOPHEN 7.5-325 MG PO TABS: 1.0000 | ORAL_TABLET | Freq: Four times a day (QID) | ORAL | 0 refills | Status: DC | PRN

## 2018-06-10 MED ORDER — AMITRIPTYLINE HCL 25 MG PO TABS: 50.0000 mg | ORAL_TABLET | Freq: Every day | ORAL | 3 refills | Status: DC

## 2018-06-10 MED ORDER — LYRICA 75 MG PO CAPS: 75.0000 mg | ORAL_CAPSULE | Freq: Two times a day (BID) | ORAL | 5 refills | Status: DC

## 2018-06-10 MED ORDER — HYDROCODONE-ACETAMINOPHEN 7.5-325 MG PO TABS: 1.0000 | ORAL_TABLET | Freq: Three times a day (TID) | ORAL | 0 refills | Status: DC | PRN

## 2018-06-10 NOTE — Telephone Encounter (Signed)
[name] a voicemail that her glucometer will be put at the front desk with her name on it and she can pick it up anytime.

## 2018-06-10 NOTE — Assessment & Plan Note (Signed)
Much improved hemoglobin A1c to 7.3% today.  Also losing weight nicely.  I think she is made some nice adjustments and is in a good place right now.  Plan to continue metformin 1000 mg twice daily.  Continue with current lifestyle modifications.  Lisinopril for renal protection.  Pravastatin low-dose for primary prevention of ischemic vascular disease.

## 2018-06-10 NOTE — Assessment & Plan Note (Signed)
Chronic pain generator is lumbar osteoarthritis.  Also recently having worsening left knee and left ankle pain.  Functional status is stable. Some improvement in adding lyrica for neuropathic pain component. Works with a Therapist, sportspsychiatrist as well. Plan is to continue with hydrocodone 7.5mg  #90 for one month, I gave a 3 month supply.  Check urine substance screen today.

## 2018-06-10 NOTE — Assessment & Plan Note (Signed)
Blood pressure well controlled today.  Plan to continue with enalapril 5 mg once daily.

## 2018-06-10 NOTE — Progress Notes (Signed)
Medical Nutrition Therapy:  Appt start time: 1000 end time:  1025 Visit # 4  Assessment:  Primary concerns today: glycemic control and weight loss  Mary Gray is here with her family member. She continues with weight loss to improve her glycemic control and has lost 19# in the past 12 weeks. Some of her blood sugars are above target but they are improving as evidenced by decrease in A1C from 9.5 to 7.3% today. She describes a healthy lower carb meal plan. Her questions show gained knowledge and interest. She is still having problems moving more due to pain. She is almost below her modified goal weight of <200#.  Learning Readiness:  change in progress  ANTHROPOMETRICS: Estimated body mass index is 37.07 kg/m as calculated from the following:   Height as of 05/01/18: 5' 2" (1.575 m).   Weight as of this encounter: 202 lb 11.2 oz (91.9 kg).   Wt Readings from Last 5 Encounters:  06/10/18 202 lb 11.2 oz (91.9 kg)  05/16/18 208 lb 12.8 oz (94.7 kg)  05/01/18 211 lb (95.7 kg)  03/18/18 221 lb 12.8 oz (100.6 kg)  07/05/17 180 lb (81.6 kg)    Current Outpatient Medications on File Prior to Visit  Medication Sig Dispense Refill  . ACCU-CHEK FASTCLIX LANCETS MISC Check blood sugar daily 102 each 5  . albuterol (PROAIR HFA) 108 (90 BASE) MCG/ACT inhaler Inhale 2 puffs into the lungs every 6 (six) hours as needed for wheezing or shortness of breath. 8.5 each 11  . ALPRAZolam (XANAX) 1 MG tablet Take 1 tablet (1 mg total) by mouth 4 (four) times daily as needed for anxiety. 30 tablet 0  . amitriptyline (ELAVIL) 25 MG tablet Take 1 tablet (25 mg total) by mouth at bedtime. (Patient taking differently: Take 50 mg by mouth at bedtime. ) 30 tablet 3  . Blood Glucose Monitoring Suppl (ACCU-CHEK GUIDE) w/Device KIT 1 each by Does not apply route daily. 1 kit 0  . diclofenac sodium (VOLTAREN) 1 % GEL Apply 2 g topically 4 (four) times daily. 1 Tube 2  . enalapril (VASOTEC) 5 MG tablet Take 1 tablet (5 mg  total) by mouth daily. 90 tablet 3  . esomeprazole (NEXIUM) 40 MG capsule Take 1 capsule (40 mg total) by mouth daily. 30 capsule 5  . FLUoxetine (PROZAC) 20 MG capsule Take 20 mg by mouth daily.     . fluticasone (FLONASE) 50 MCG/ACT nasal spray USE 2 SPRAYS IN EACH NOSTRIL ONCE A DAY 16 g 5  . furosemide (LASIX) 40 MG tablet TAKE 1 TABLET BY MOUTH TWICE A DAY 60 tablet 6  . glucose blood (ACCU-CHEK GUIDE) test strip Check blood sugar daily 50 each 5  . HYDROcodone-acetaminophen (NORCO) 7.5-325 MG tablet Take 1 tablet by mouth 3 (three) times daily as needed for moderate pain. 90 tablet 0  . HYDROcodone-acetaminophen (NORCO) 7.5-325 MG tablet Take 1 tablet by mouth every 6 (six) hours as needed for moderate pain. 90 tablet 0  . HYDROcodone-acetaminophen (NORCO) 7.5-325 MG tablet Take 1 tablet by mouth every 6 (six) hours as needed for moderate pain. 90 tablet 0  . ipratropium (ATROVENT HFA) 17 MCG/ACT inhaler Inhale 2 puffs into the lungs 4 (four) times daily as needed for wheezing. 12.9 g 6  . KLOR-CON M10 10 MEQ tablet TAKE 2 TABLETS BY MOUTH EVERY DAY 60 tablet 5  . lactulose (KRISTALOSE) 20 g packet Take 1 packet (20 g total) by mouth daily as needed (constipation). 30 each   5  . levocetirizine (XYZAL) 5 MG tablet TAKE 1 TABLET BY MOUTH EVERY EVENING 90 tablet 3  . levothyroxine (SYNTHROID) 88 MCG tablet TAKE 1 TABLET BY MOUTH EVERY DAY BEFORE BREAKFAST 90 tablet 3  . metFORMIN (GLUCOPHAGE-XR) 500 MG 24 hr tablet TAKE 2 TABLETS BY MOUTH TWICE A DAY 360 tablet 3  . naproxen (NAPROSYN) 500 MG tablet TAKE 1 TABLET (500 MG TOTAL) BY MOUTH 2 (TWO) TIMES DAILY WITH A MEAL. 60 tablet 0  . nystatin (NYSTATIN) powder Apply topically 4 (four) times daily. 56.7 g 5  . pravastatin (PRAVACHOL) 10 MG tablet TAKE 1 TABLET (10 MG TOTAL) BY MOUTH EVERY EVENING. 90 tablet 3  . SYMBICORT 160-4.5 MCG/ACT inhaler TAKE 2 PUFFS BY MOUTH TWICE A DAY 30.6 Inhaler 1  . tamsulosin (FLOMAX) 0.4 MG CAPS capsule Take 0.4  mg by mouth daily.  3  . zolpidem (AMBIEN) 10 MG tablet Take 0.5 tablets (5 mg total) by mouth at bedtime as needed for sleep. 30 tablet 0   No current facility-administered medications on file prior to visit.    DIETARY INTAKE: Usual eating pattern includes 2-3 meals and 1-2 snacks per day. Everyday foods include oatmeal, grits, eggs, chicken baked, yogurt, vegetables.    24-hr recall:  B (11AM - 2 PM):Coffee with small amount sugar free creamer 3 PM- blueberry bread parfait D ( 12 AM): small serving of corn chips, lean hamburger, sour cream, cheese, salsa Beverages:  water, v-8 with fiber  Progress Towards Goal(s):  Some progress.   Nutritional Diagnosis:  [place]-2.2 Altered nutrition-related laboratory As related to elevated blood sugars appears to be improving  As evidenced by decreased blood sugars ( and a1c) and weight loss.     Intervention:  Nutrition education about diabetes medicine and their effect on weight, 1200 calorie meal plan.  Discussed plan to help her with her goal of  continued weight loss. Coordination of care: consider adding SGLT2 or GLP-1 if patient agrees to metformin if needed   Teaching Method Utilized: Visual, Auditory and Hands on Handouts given during visit include:1200 meal plan, avs Barriers to learning/adherence to lifestyle change: competing values Demonstrated degree of understanding via:  Teach Back   Monitoring/Evaluation:  Dietary intake, exercise, meter, and body weight in 6 week(s). Butch Penny Emberly Tomasso, RD 06/10/2018 10:29 AM.

## 2018-06-10 NOTE — Progress Notes (Signed)
Referral for additional MNT and DSMT

## 2018-06-10 NOTE — Patient Instructions (Addendum)
Yeah- your a1c is back down. It is back down to 7.3%.   Your weight is decreased also.  Wt Readings from Last 5 Encounters:  06/10/18 202 lb 11.2 oz (91.9 kg)  05/16/18 208 lb 12.8 oz (94.7 kg)  05/01/18 211 lb (95.7 kg)  03/18/18 221 lb 12.8 oz (100.6 kg)  07/05/17 180 lb (81.6 kg)    Keep following a limited calorie meal plan.   1200 Calorie Sample Low Carb Menu Breakfast 1/2 cup lowfat cottage cheese + 1/2 banana. Sprinkle with 3 Tablespoons lowfat granola. Lunch 1/2 [place] sandwich: 1 slice whole wheat bread (or 2 slices of "lite" bread, 35-40 calories each) 3 ounces sliced [place] breast lunch meat 2 teaspoon mayonnaise or 1/4 avocado romaine lettuce leaves and tomato slices as desired Eat with 1 small apple and 5 cherry tomatoes. Snack 1 mozzarella string cheese + 15 baby carrots Dinner 4 ounces grilled chicken breast 1/2 cup brown rice sprinkled with 1 Tablespoon slivered almonds 1 1/2 cups steamed broccoli drizzled with 1 teaspoons sesame oil Snack 15 frozen grapes Add zero calorie foods if desired.  Call me about following up the week of September 3-6th.   Lupita LeashDonna [phone]

## 2018-06-10 NOTE — Progress Notes (Signed)
   Assessment and Plan:  See Encounters tab for problem-based medical decision making.   __________________________________________________________  HPI:   57 year old woman here for follow-up of chronic low back pain.  Her pain complaints are stable.  She walks slowly with a walker, no recent falls.  Reports good compliance with medications.  Recently had a Lyrica added which is helped with neuropathic pain component in her legs.  Evaluated a few months ago for left knee pain, x-ray with moderate osteoarthritis.  Wearing a knee brace today which she thinks is helpful.  Also complaining of left ankle pain  for about same amount of time.  Feels unsteady on her left leg overall.  She is somewhat frustrated by lack of progress.  Diabetes much better controlled today.  I congratulated her on improved A1c.  She reports good weight loss.  She is been working with her nutritionist and making a lot of changes in her diet.  Trying to improve her functioning as well.  Good compliance with metformin.  No chest pain or shortness of breath.  Currently living with her niece and mother.  She lost her sister back in April to an accidental choking.  She been working with her psychiatrist to adjust her insomnia and mood medications to get her through this difficult time.  __________________________________________________________  Problem List: Patient Active Problem List   Diagnosis Date Noted  . Chronic use of opiate for therapeutic purpose 02/08/2016    Priority: High  . Spinal stenosis of lumbar region 03/01/2011    Priority: High  . Diabetes mellitus type 2, uncontrolled (HCC) 07/06/2006    Priority: High  . Generalized anxiety disorder 07/06/2006    Priority: High  . AVN (avascular necrosis of bone) (HCC) 03/07/2013    Priority: Medium  . Hypothyroidism 07/06/2006    Priority: Medium  . Essential hypertension 07/06/2006    Priority: Medium  . Osteoarthritis of left knee 05/01/2018   Priority: Low  . Chronic venous insufficiency 02/26/2017    Priority: Low  . Candidal skin infection 03/03/2014    Priority: Low  . Preventative health care 05/14/2013    Priority: Low  . Hyperlipemia 07/06/2006    Priority: Low  . GERD 07/06/2006    Priority: Low  . Ankle pain 11/15/2009    Medications: Reconciled today in Epic __________________________________________________________  Physical Exam:  Vital Signs: Vitals:   06/10/18 1031  BP: 125/67  Pulse: 89  Temp: 98.3 F (36.8 C)  TempSrc: Oral    Gen: Well appearing, NAD CV: RRR, no murmurs Pulm: Normal effort, CTA throughout, no wheezing Ext: Warm, trace left lower extremity edema, mild crepitus of left knee with no effusion, no left ankle effusion.

## 2018-06-10 NOTE — Assessment & Plan Note (Signed)
Moderate left ankle pain over the last few months.  Exam is reassuring, probably ankle sprain, may be component of osteoarthritis as well.  Plan for left ankle x-ray today.

## 2018-06-11 ENCOUNTER — Encounter: Payer: Self-pay | Admitting: Student in an Organized Health Care Education/Training Program

## 2018-06-12 ENCOUNTER — Telehealth: Payer: Self-pay

## 2018-06-12 NOTE — Telephone Encounter (Signed)
Called pt gave her dr vincent's message, she ask about her PT referral, gave her OP PT ph#, she will call and f/u from their ph calls

## 2018-06-12 NOTE — Telephone Encounter (Signed)
Xray was normal with minimal arthritis, no fracture, no dislocation. I sent a letter to her with these results yesterday.

## 2018-06-12 NOTE — Telephone Encounter (Signed)
Requesting X-ray result. Please call pt back.

## 2018-06-14 LAB — TOXASSURE SELECT,+ANTIDEPR,UR

## 2018-06-17 ENCOUNTER — Encounter: Payer: Self-pay | Admitting: Student in an Organized Health Care Education/Training Program

## 2018-06-17 ENCOUNTER — Telehealth: Payer: Self-pay | Admitting: Student in an Organized Health Care Education/Training Program

## 2018-06-17 NOTE — Telephone Encounter (Signed)
Unfortunately, the urine substance testing from our recent clinic visit has returned inappropriate. There was no detectable hydrocodone present in her system. We have been prescribing this continuously and she reported to me good compliance with the medication. Prior to this, she went 8 months in between our visits with multiple no-shows and calling in for refills until I had to refuse to do so. I think these episodes in combination point to high risk of continuing with chronic opioids in her case. There is risk her for diversion and not taking this medication as prescribed.   I will not be providing Mary Gray with any further opioids for her chronic pain generators (low back and knee OA). I have sent her a certified letter explaining this. We have called her pharmacy to cancel the hydrocodone prescriptions from our last visit. I am willing to continue to manage her other chronic medical conditions. If she would like a second opinion, I can refer her to a pain clinic. Hydrocodone removed from her medication list, and I have left an FYI notification of this contract violation.

## 2018-06-22 ENCOUNTER — Telehealth: Payer: Self-pay | Admitting: Internal Medicine

## 2018-06-22 ENCOUNTER — Other Ambulatory Visit: Payer: Self-pay

## 2018-06-22 ENCOUNTER — Ambulatory Visit (HOSPITAL_COMMUNITY)
Admission: EM | Admit: 2018-06-22 | Discharge: 2018-06-22 | Disposition: A | Discharge status: Home / Self Care | Payer: Medicare HMO | Attending: Family Medicine | Admitting: Family Medicine

## 2018-06-22 ENCOUNTER — Encounter (HOSPITAL_COMMUNITY): Payer: Self-pay | Admitting: Emergency Medicine

## 2018-06-22 DIAGNOSIS — B349 Viral infection, unspecified: Secondary | ICD-10-CM | POA: Diagnosis not present

## 2018-06-22 MED ORDER — GUAIFENESIN 200 MG PO TABS: 200.0000 mg | ORAL_TABLET | ORAL | 0 refills | Status: DC | PRN

## 2018-06-22 MED ORDER — BENZONATATE 100 MG PO CAPS: 100.0000 mg | ORAL_CAPSULE | Freq: Three times a day (TID) | ORAL | 0 refills | Status: DC

## 2018-06-22 MED ORDER — FLUTICASONE PROPIONATE 50 MCG/ACT NA SUSP: 2.0000 | Freq: Every day | NASAL | 0 refills | Status: DC

## 2018-06-22 MED ORDER — PREDNISONE 50 MG PO TABS: 50.0000 mg | ORAL_TABLET | Freq: Every day | ORAL | 0 refills | Status: AC

## 2018-06-22 MED ORDER — IPRATROPIUM BROMIDE 0.06 % NA SOLN: 2.0000 | Freq: Four times a day (QID) | NASAL | 0 refills | Status: DC

## 2018-06-22 MED ORDER — AZITHROMYCIN 250 MG PO TABS: 250.0000 mg | ORAL_TABLET | Freq: Every day | ORAL | 0 refills | Status: DC

## 2018-06-22 NOTE — Discharge Instructions (Signed)
Tessalon for cough. Prednisone for cough, sinus pressure. Start flonase, atrovent nasal spray for nasal congestion/drainage. You can use over the counter nasal saline rinse such as neti pot for nasal congestion. Keep hydrated, your urine should be clear to pale yellow in color. Tylenol/motrin for fever and pain. Monitor for any worsening of symptoms, chest pain, shortness of breath, wheezing, swelling of the throat, follow up for reevaluation.   For sore throat/cough try using a honey-based tea. Use 3 teaspoons of honey with juice squeezed from half lemon. Place shaved pieces of ginger into 1/2-1 cup of water and warm over stove top. Then mix the ingredients and repeat every 4 hours as needed.  If symptoms not improving in 5 days, can fill azithromycin for sinus infection/bronchitis.

## 2018-06-22 NOTE — ED Provider Notes (Signed)
[facility]    CSN: [id number] Arrival date & time: 06/22/18  1038     History   Chief Complaint Chief Complaint  Patient presents with  . URI    HPI Mary Gray is a 57 y.o. female.   57 year old female with history of COPD, diabetes, hypertension, hypothyroidism comes in for 5-day history of URI symptoms.  Has had nasal congestion, rhinorrhea, sinus pressure, nonproductive cough. Bilateral ear pain. Subjective fever without chills, night sweats.  Body aches.  States with decreased appetite, perhaps been drinking without difficulty.  Advil, TheraFlu, nasal spray, inhaler with some relief.  Former smoker.  No tick bites.     Past Medical History:  Diagnosis Date  . Chronic interstitial cystitis    Followed by Dr. Franchot Gallo  . COPD (chronic obstructive pulmonary disease) ([place])   . DDD (degenerative disc disease), lumbar   . Degeneration of cervical intervertebral disc   . Depression   . Diabetes mellitus, type 2 ([place]) 2007  . Heme positive stool    EGD on 04/08/2010 by Dr. Watt Climes showed chronic gastritis and a few gastric polyps; pathology showed a fundic gland polyp.  Colonoscopy on 04/08/2010 showed small external and internal hemorrhoids, and a few benign-appearing diminutive polyps in the rectum, the distal sigmoid colon, and in the distal descending colon; pathology showed hyperplastic polyps.   . Hypertension   . Hypothyroidism   . Iron deficiency anemia   . Spinal stenosis     Patient Active Problem List   Diagnosis Date Noted  . Osteoarthritis of left knee 05/01/2018  . Chronic venous insufficiency 02/26/2017  . Chronic use of opiate for therapeutic purpose 02/08/2016  . Candidal skin infection 03/03/2014  . Preventative health care 05/14/2013  . AVN (avascular necrosis of bone) ([place]) 03/07/2013  . Spinal stenosis of lumbar region 03/01/2011  . Ankle pain 11/15/2009  . Hypothyroidism 07/06/2006  . Diabetes mellitus type 2, uncontrolled  ([place]) 07/06/2006  . Hyperlipemia 07/06/2006  . Generalized anxiety disorder 07/06/2006  . Essential hypertension 07/06/2006  . GERD 07/06/2006    Past Surgical History:  Procedure Laterality Date  . CARPAL TUNNEL RELEASE  05/08/2000   By Dr. Youlanda Mighty. Sypher, Brooke Bonito.    OB History   None      Home Medications    Prior to Admission medications   Medication Sig Start Date End Date Taking? Authorizing Provider  acetaminophen (TYLENOL) 325 MG tablet Take 650 mg by mouth every 6 (six) hours as needed.   Yes [provider]  ACCU-CHEK FASTCLIX LANCETS MISC Check blood sugar daily 03/18/18   Axel Filler, MD  albuterol [facility] HFA) 108 (90 BASE) MCG/ACT inhaler Inhale 2 puffs into the lungs every 6 (six) hours as needed for wheezing or shortness of breath. 05/25/14   Bertha Stakes, MD  ALPRAZolam Duanne Moron) 1 MG tablet Take 1 tablet (1 mg total) by mouth 4 (four) times daily as needed for anxiety. 03/18/18   Axel Filler, MD  amitriptyline (ELAVIL) 25 MG tablet Take 2 tablets (50 mg total) by mouth at bedtime. 06/10/18   Axel Filler, MD  ARIPiprazole (ABILIFY) 10 MG tablet Take 10 mg by mouth daily. 05/30/18   [provider]  azithromycin (ZITHROMAX) 250 MG tablet Take 1 tablet (250 mg total) by mouth daily. Take first 2 tablets together, then 1 every day until finished. 06/22/18   Yu, Amy V, PA-C  BELSOMRA 20 MG TABS 1 TABLET(S) 1 TIME A DAY AT  BEDTIME FOR INSOMNIA 06/03/18   [provider]  benzonatate (TESSALON) 100 MG capsule Take 1 capsule (100 mg total) by mouth every 8 (eight) hours. 06/22/18   Tasia Catchings, Amy V, PA-C  Blood Glucose Monitoring Suppl (ACCU-CHEK GUIDE) w/Device KIT 1 each by Does not apply route daily. 03/18/18   Axel Filler, MD  diclofenac sodium (VOLTAREN) 1 % GEL Apply 2 g topically 4 (four) times daily. 05/01/18   Lacroce, Hulen Shouts, MD  enalapril (VASOTEC) 5 MG tablet Take 1 tablet (5 mg total) by mouth daily. 06/10/18    Axel Filler, MD  esomeprazole (NEXIUM) 40 MG capsule Take 1 capsule (40 mg total) by mouth daily. 07/17/17   Axel Filler, MD  estradiol (ESTRACE) 0.1 MG/GM vaginal cream INSERT IN APPLICATOR INTO VAGINA TWICE WEEKLY AT BEDTIME 05/14/18   [provider]  FLUoxetine (PROZAC) 40 MG capsule Take by mouth daily. 04/30/18   [provider]  fluticasone (FLONASE) 50 MCG/ACT nasal spray Place 2 sprays into both nostrils daily. 06/22/18   Tasia Catchings, Amy V, PA-C  furosemide (LASIX) 40 MG tablet TAKE 1 TABLET BY MOUTH TWICE A DAY 05/06/18   Axel Filler, MD  glucose blood (ACCU-CHEK GUIDE) test strip Check blood sugar daily 03/18/18   Axel Filler, MD  guaiFENesin 200 MG tablet Take 1 tablet (200 mg total) by mouth every 4 (four) hours as needed for cough or to loosen phlegm. 06/22/18   Ledell Noss, MD  ipratropium (ATROVENT HFA) 17 MCG/ACT inhaler Inhale 2 puffs into the lungs 4 (four) times daily as needed for wheezing. 10/09/14   Bertha Stakes, MD  ipratropium (ATROVENT) 0.06 % nasal spray Place 2 sprays into both nostrils 4 (four) times daily. 06/22/18   Ok Edwards, PA-C  KLOR-CON M10 10 MEQ tablet TAKE 2 TABLETS BY MOUTH EVERY DAY 04/24/18   Axel Filler, MD  lactulose (KRISTALOSE) 20 g packet Take 1 packet (20 g total) by mouth daily as needed (constipation). 01/22/17   Axel Filler, MD  levocetirizine (XYZAL) 5 MG tablet TAKE 1 TABLET BY MOUTH EVERY EVENING 09/19/17   Axel Filler, MD  levothyroxine (SYNTHROID) 88 MCG tablet TAKE 1 TABLET BY MOUTH EVERY DAY BEFORE BREAKFAST 03/05/18   Axel Filler, MD  LYRICA 75 MG capsule Take 1 capsule (75 mg total) by mouth 2 (two) times daily. 06/10/18   Axel Filler, MD  metFORMIN (GLUCOPHAGE-XR) 500 MG 24 hr tablet TAKE 2 TABLETS BY MOUTH TWICE A DAY 09/19/17   Axel Filler, MD  naproxen (NAPROSYN) 500 MG tablet TAKE 1 TABLET (500 MG TOTAL) BY MOUTH 2 (TWO) TIMES DAILY  WITH A MEAL. 05/28/18 06/27/18  Axel Filler, MD  nystatin (NYSTATIN) powder Apply topically 4 (four) times daily. 03/18/18   Axel Filler, MD  pravastatin (PRAVACHOL) 10 MG tablet TAKE 1 TABLET (10 MG TOTAL) BY MOUTH EVERY EVENING. 01/28/18   Axel Filler, MD  predniSONE (DELTASONE) 50 MG tablet Take 1 tablet (50 mg total) by mouth daily for 5 days. 06/22/18 06/27/18  Ok Edwards, PA-C  SYMBICORT 160-4.5 MCG/ACT inhaler TAKE 2 PUFFS BY MOUTH TWICE A DAY 10/31/17   Axel Filler, MD    Family History Family History  Problem Relation Age of Onset  . Heart attack Father 43  . Diabetes Mother   . Hypertension Mother   . Diabetes Sister   . Breast cancer Neg Hx   . Colon cancer Neg Hx  Social History Social History   Tobacco Use  . Smoking status: Former Smoker    Packs/day: 0.50    Years: 15.00    Pack years: 7.50    Types: Cigarettes    Last attempt to quit: 01/18/1996    Years since quitting: 22.4  . Smokeless tobacco: Never Used  Substance Use Topics  . Alcohol use: No    Alcohol/week: 0.0 oz  . Drug use: No     Allergies   Codeine sulfate; Meperidine and related; Minocycline hcl; Penicillins; Propoxyphene n-acetaminophen; and Tetracycline hcl   Review of Systems Review of Systems  Reason unable to perform ROS: See HPI as above.     Physical Exam Triage Vital Signs ED Triage Vitals  Enc Vitals Group     BP 06/22/18 1142 125/61     Pulse Rate 06/22/18 1142 85     Resp 06/22/18 1142 (!) 22     Temp 06/22/18 1142 98.3 F (36.8 C)     Temp Source 06/22/18 1142 Oral     SpO2 06/22/18 1142 97 %     Weight --      Height --      Head Circumference --      Peak Flow --      Pain Score 06/22/18 1140 10     Pain Loc --      Pain Edu? --      Excl. in [place]? --    No data found.  Updated Vital Signs BP 125/61 (BP Location: Left Arm)   Pulse 85   Temp 98.3 F (36.8 C) (Oral)   Resp (!) 22   LMP 01/17/2009   SpO2 97%    Physical Exam  Constitutional: She is oriented to person, place, and time. She appears well-developed and well-nourished. No distress.  HENT:  Head: Normocephalic and atraumatic.  Right Ear: Tympanic membrane, external ear and ear canal normal. Tympanic membrane is not erythematous and not bulging.  Left Ear: Tympanic membrane, external ear and ear canal normal. Tympanic membrane is not erythematous and not bulging.  Nose: Right sinus exhibits maxillary sinus tenderness and frontal sinus tenderness. Left sinus exhibits maxillary sinus tenderness and frontal sinus tenderness.  Mouth/Throat: Uvula is midline, oropharynx is clear and moist and mucous membranes are normal.  Eyes: Pupils are equal, round, and reactive to light. Conjunctivae are normal.  Neck: Normal range of motion. Neck supple.  Cardiovascular: Normal rate, regular rhythm and normal heart sounds. Exam reveals no gallop and no friction rub.  No murmur heard. Pulmonary/Chest: Effort normal and breath sounds normal. No stridor. No respiratory distress. She has no decreased breath sounds. She has no wheezes. She has no rhonchi. She has no rales.  Lymphadenopathy:    She has no cervical adenopathy.  Neurological: She is alert and oriented to person, place, and time.  Skin: Skin is warm and dry. She is not diaphoretic.  Psychiatric: She has a normal mood and affect. Her behavior is normal. Judgment normal.     UC Treatments / Results  Labs (all labs ordered are listed, but only abnormal results are displayed) Labs Reviewed - No data to display  EKG None  Radiology No results found.  Procedures Procedures (including critical care time)  Medications Ordered in UC Medications - No data to display  Initial Impression / Assessment and Plan / UC Course  I have reviewed the triage vital signs and the nursing notes.  Pertinent labs & imaging results that were available during  my care of the patient were reviewed by me and  considered in my medical decision making (see chart for details).    Discussed with patient history and exam most consistent with viral URI.  Prednisone as directed.  Symptomatic treatment as needed. Push fluids.  Rx of azithromycin provided, can fill in 5 days if symptoms not improving for sinusitis/bronchitis.  Return precautions given.   Final Clinical Impressions(s) / UC Diagnoses   Final diagnoses:  Viral illness    ED Prescriptions    Medication Sig Dispense Auth. Provider   benzonatate (TESSALON) 100 MG capsule Take 1 capsule (100 mg total) by mouth every 8 (eight) hours. 21 capsule Yu, Amy V, PA-C   fluticasone (FLONASE) 50 MCG/ACT nasal spray Place 2 sprays into both nostrils daily. 1 g Yu, Amy V, PA-C   ipratropium (ATROVENT) 0.06 % nasal spray Place 2 sprays into both nostrils 4 (four) times daily. 15 mL Yu, Amy V, PA-C   predniSONE (DELTASONE) 50 MG tablet Take 1 tablet (50 mg total) by mouth daily for 5 days. 5 tablet Yu, Amy V, PA-C   azithromycin (ZITHROMAX) 250 MG tablet Take 1 tablet (250 mg total) by mouth daily. Take first 2 tablets together, then 1 every day until finished. 6 tablet Tobin Chad, PA-C 06/22/18 1224
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
## 2018-06-22 NOTE — ED Triage Notes (Signed)
Complains of congestion.    Patient is on the phone in in-take room  Sinus headache, itchy runny eyes, stuffy nose.

## 2018-06-24 ENCOUNTER — Other Ambulatory Visit: Payer: Self-pay | Admitting: Student in an Organized Health Care Education/Training Program

## 2018-06-24 ENCOUNTER — Encounter: Payer: Self-pay | Admitting: Student in an Organized Health Care Education/Training Program

## 2018-06-24 ENCOUNTER — Ambulatory Visit (INDEPENDENT_AMBULATORY_CARE_PROVIDER_SITE_OTHER): Payer: Medicare HMO | Admitting: Student in an Organized Health Care Education/Training Program

## 2018-06-24 ENCOUNTER — Other Ambulatory Visit: Payer: Self-pay

## 2018-06-24 ENCOUNTER — Encounter (INDEPENDENT_AMBULATORY_CARE_PROVIDER_SITE_OTHER): Payer: Self-pay

## 2018-06-24 VITALS — BP 131/66 | HR 87 | Temp 98.7°F | Wt 201.9 lb

## 2018-06-24 DIAGNOSIS — M47816 Spondylosis without myelopathy or radiculopathy, lumbar region: Secondary | ICD-10-CM | POA: Diagnosis not present

## 2018-06-24 DIAGNOSIS — M48061 Spinal stenosis, lumbar region without neurogenic claudication: Secondary | ICD-10-CM | POA: Diagnosis not present

## 2018-06-24 DIAGNOSIS — G8929 Other chronic pain: Secondary | ICD-10-CM

## 2018-06-24 DIAGNOSIS — Z79891 Long term (current) use of opiate analgesic: Secondary | ICD-10-CM | POA: Diagnosis not present

## 2018-06-24 NOTE — Assessment & Plan Note (Signed)
Lumbar OA is a chronic pain generator.  Pain is currently stable, but we had a major problem with her last urine substance test.  It was negative for prescribed hydrocodone despite reported good compliance.  This is high risk for diversion of medication.  I think this combined with her recent history of missed appointments contraindicates further chronic opioid therapy with our clinic.  Explained this all to her very carefully, she says she does not understand, the urine test was a mistake, wants to try to take it again.  She would like a second opinion.  I will refer her to a pain clinic for that opinion.

## 2018-06-24 NOTE — Progress Notes (Signed)
   Assessment and Plan:  See Encounters tab for problem-based medical decision making.   __________________________________________________________  HPI:   57 year old woman reported to be here for follow-up of her recent upper respiratory tract infection.  She was seen in urgent care 3 days ago for a nonproductive cough, sinus pain and pressure, and fevers.  Diagnosed with an upper respiratory tract infection with a component of sinusitis.  Treated with prednisone and azithromycin but has been compliant with those medications.  Reports that her symptoms are improving slowly, cough is still nonproductive.  No fevers.  Sinus pain is improved.  Overall this is doing much better.  I advised that she discontinue prednisone at this point as it is causing hyperglycemia.  The real reason she wanted to come visit today is because of recent inappropriate urine substance test from her last visit.  She has a chronic pain due to lumbar osteoarthritis for which she has been receiving hydrocodone for about 2 years.  In the past she has been prescribed Dilaudid and oxycodone all with varying benefit.  We had a stretch of a lot of missed appointments last winter, went at least 6 months in between appointments until she started coming again.  Had a lot of recent social stress, death of her sister.  I sent her a certified letter indicating that her urine substance test was high risk for diversion that we would not prescribe her for other opioids.  She reports that she has been taking the medication as prescribed, she is not sure why the medicine is not in her system.  She thinks it might be an interaction with 1 of her other medications.  She reports that she still has chronic back pain, does not want to do surgery.  Reports that she is not going to be able to function without the pain medication.  Denies giving it or selling it to others.  Would like a second  opinion.  __________________________________________________________  Problem List: Patient Active Problem List   Diagnosis Date Noted  . Spinal stenosis of lumbar region 03/01/2011    Priority: High  . Diabetes mellitus type 2, uncontrolled (HCC) 07/06/2006    Priority: High  . Generalized anxiety disorder 07/06/2006    Priority: High  . AVN (avascular necrosis of bone) (HCC) 03/07/2013    Priority: Medium  . Hypothyroidism 07/06/2006    Priority: Medium  . Essential hypertension 07/06/2006    Priority: Medium  . Osteoarthritis of left knee 05/01/2018    Priority: Low  . Chronic venous insufficiency 02/26/2017    Priority: Low  . Candidal skin infection 03/03/2014    Priority: Low  . Preventative health care 05/14/2013    Priority: Low  . Hyperlipemia 07/06/2006    Priority: Low  . GERD 07/06/2006    Priority: Low    Medications: Reconciled today in Epic __________________________________________________________  Physical Exam:  Vital Signs: Vitals:   06/24/18 1106  BP: 131/66  Pulse: 87  Temp: 98.7 F (37.1 C)  TempSrc: Oral  SpO2: 98%  Weight: 201 lb 14.4 oz (91.6 kg)    Gen: Well appearing, NAD CV: RRR, no murmurs Pulm: Normal effort, CTA throughout, no wheezing

## 2018-06-24 NOTE — Telephone Encounter (Signed)
Pt has an appt scheduled today with her PCP.

## 2018-06-27 DIAGNOSIS — F5101 Primary insomnia: Secondary | ICD-10-CM | POA: Diagnosis not present

## 2018-06-27 DIAGNOSIS — F411 Generalized anxiety disorder: Secondary | ICD-10-CM | POA: Diagnosis not present

## 2018-06-27 DIAGNOSIS — F3181 Bipolar II disorder: Secondary | ICD-10-CM | POA: Diagnosis not present

## 2018-06-27 DIAGNOSIS — F332 Major depressive disorder, recurrent severe without psychotic features: Secondary | ICD-10-CM | POA: Diagnosis not present

## 2018-06-30 ENCOUNTER — Other Ambulatory Visit: Payer: Self-pay | Admitting: Student in an Organized Health Care Education/Training Program

## 2018-06-30 DIAGNOSIS — I10 Essential (primary) hypertension: Secondary | ICD-10-CM

## 2018-07-03 ENCOUNTER — Ambulatory Visit (INDEPENDENT_AMBULATORY_CARE_PROVIDER_SITE_OTHER): Payer: Medicare HMO | Admitting: Dietician

## 2018-07-03 ENCOUNTER — Ambulatory Visit (INDEPENDENT_AMBULATORY_CARE_PROVIDER_SITE_OTHER): Payer: Medicare HMO | Admitting: Internal Medicine

## 2018-07-03 ENCOUNTER — Encounter: Payer: Self-pay | Admitting: Internal Medicine

## 2018-07-03 ENCOUNTER — Telehealth: Payer: Self-pay | Admitting: Dietician

## 2018-07-03 ENCOUNTER — Encounter: Payer: Self-pay | Admitting: Dietician

## 2018-07-03 VITALS — BP 130/66 | HR 94 | Temp 98.1°F | Wt 196.4 lb

## 2018-07-03 DIAGNOSIS — E118 Type 2 diabetes mellitus with unspecified complications: Secondary | ICD-10-CM

## 2018-07-03 DIAGNOSIS — Z880 Allergy status to penicillin: Secondary | ICD-10-CM

## 2018-07-03 DIAGNOSIS — M1712 Unilateral primary osteoarthritis, left knee: Secondary | ICD-10-CM

## 2018-07-03 DIAGNOSIS — Z713 Dietary counseling and surveillance: Secondary | ICD-10-CM

## 2018-07-03 DIAGNOSIS — E119 Type 2 diabetes mellitus without complications: Secondary | ICD-10-CM | POA: Diagnosis not present

## 2018-07-03 DIAGNOSIS — E039 Hypothyroidism, unspecified: Secondary | ICD-10-CM | POA: Diagnosis not present

## 2018-07-03 DIAGNOSIS — E1165 Type 2 diabetes mellitus with hyperglycemia: Secondary | ICD-10-CM

## 2018-07-03 DIAGNOSIS — I10 Essential (primary) hypertension: Secondary | ICD-10-CM | POA: Diagnosis not present

## 2018-07-03 DIAGNOSIS — H6690 Otitis media, unspecified, unspecified ear: Secondary | ICD-10-CM

## 2018-07-03 DIAGNOSIS — Z6835 Body mass index (BMI) 35.0-35.9, adult: Secondary | ICD-10-CM | POA: Diagnosis not present

## 2018-07-03 DIAGNOSIS — F411 Generalized anxiety disorder: Secondary | ICD-10-CM

## 2018-07-03 DIAGNOSIS — H6593 Unspecified nonsuppurative otitis media, bilateral: Secondary | ICD-10-CM

## 2018-07-03 MED ORDER — CEFDINIR 300 MG PO CAPS: 300.0000 mg | ORAL_CAPSULE | Freq: Two times a day (BID) | ORAL | 0 refills | Status: AC

## 2018-07-03 NOTE — Patient Instructions (Addendum)
Dear [name],  Thank you for your visit today.  Your appointment with Dr. Dione BoozeGroat is Thursday,  September  19 at 1:30 PM.  When  you need 2 batteries for your meter- they are the [id number] size batteries. You can get them at any grocery, [employer] or pharmacy in the battery section. They are very common.  You reached your goal today of weighing less than 200#. The total amount you have lost now is: 25#!!! Way to go!!!  Your new goal is: <190#  Your blood sugars could still be a bit lower- shoot for <130 before meals.  Moving more as much as you can can help.  Also consider using more lower carb foods when possible. Examples are:   Can try SPAGHETTI SQUASH instead of pasta or cook  Pasta for a shorter time. nuts or nut butters(peanut butter)   chia seeds  sunflower seeds  pumpkin seeds  vegetable protein foods like cheese, sausage, tofu egg salad Vegetables with all meals and snacks can dip in ranch dressing  avocados Egg whites Chicken Greek yogurt- unsweetened  Whole grains like quinoa, barley, rye, sourdough, pumpernickel, bulger, etc.

## 2018-07-03 NOTE — Telephone Encounter (Signed)
Assisted patient with contacting Dr. Laruth BouchardGroat's office about a payment plan so she can make an appointment for an eye exam. Mary Gray reports worsening vision and made an appointment for Thursday,  September  19 at 1:30 PM.

## 2018-07-03 NOTE — Progress Notes (Signed)
Medical Nutrition Therapy:  Appt start time: 2376 end time:  1100 Visit # 5  Assessment:  Primary concerns today: glycemic control and weight loss  Mary Gray continues with weight loss to improve her glycemic control and has lost 25# in the past 14 weeks intentionally. She reached her goal weight of <200# today. Her new goal is: <190#. Mary Gray says her glucose may be a bit higher due to her recent steroid treatment and ear infection.  Meter download: #average x30 days 177, range 124-289, testing 0.6 times a day. Encourage lower carb food choices and physical activity to help lower  glucose Diet: She describes a healthy lower carb meal plan. Her questions show gained knowledge and interest.  Physical activity: She is still having problems moving more due to pain.   ANTHROPOMETRICS: Estimated body mass index is 35.92 kg/m as calculated from the following:   Height as of 05/01/18: _0  (1.575 m).   Weight as of an earlier encounter on 07/03/18: 196 lb 6.4 oz (89.1 kg).   Wt Readings from Last 5 Encounters:  07/03/18 196 lb 6.4 oz (89.1 kg)  06/24/18 201 lb 14.4 oz (91.6 kg)  06/10/18 202 lb 11.2 oz (91.9 kg)  05/16/18 208 lb 12.8 oz (94.7 kg)  05/01/18 211 lb (95.7 kg)    Current Outpatient Medications on File Prior to Visit  Medication Sig Dispense Refill  . ACCU-CHEK FASTCLIX LANCETS MISC Check blood sugar daily 102 each 5  . acetaminophen (TYLENOL) 325 MG tablet Take 650 mg by mouth every 6 (six) hours as needed.    Marland Kitchen albuterol (PROAIR HFA) 108 (90 BASE) MCG/ACT inhaler Inhale 2 puffs into the lungs every 6 (six) hours as needed for wheezing or shortness of breath. 8.5 each 11  . ALPRAZolam (XANAX) 1 MG tablet Take 1 tablet (1 mg total) by mouth 4 (four) times daily as needed for anxiety. 30 tablet 0  . amitriptyline (ELAVIL) 25 MG tablet Take 2 tablets (50 mg total) by mouth at bedtime. 90 tablet 3  . ARIPiprazole (ABILIFY) 10 MG tablet Take 10 mg by mouth daily.  0  . azithromycin  (ZITHROMAX) 250 MG tablet Take 1 tablet (250 mg total) by mouth daily. Take first 2 tablets together, then 1 every day until finished. 6 tablet 0  . BELSOMRA 20 MG TABS 1 TABLET(S) 1 TIME A DAY AT BEDTIME FOR INSOMNIA  0  . benzonatate (TESSALON) 100 MG capsule Take 1 capsule (100 mg total) by mouth every 8 (eight) hours. 21 capsule 0  . Blood Glucose Monitoring Suppl (ACCU-CHEK GUIDE) w/Device KIT 1 each by Does not apply route daily. 1 kit 0  . diclofenac sodium (VOLTAREN) 1 % GEL Apply 2 g topically 4 (four) times daily. 1 Tube 2  . enalapril (VASOTEC) 5 MG tablet Take 1 tablet (5 mg total) by mouth daily. 90 tablet 3  . esomeprazole (NEXIUM) 40 MG capsule Take 1 capsule (40 mg total) by mouth daily. 30 capsule 5  . estradiol (ESTRACE) 0.1 MG/GM vaginal cream INSERT IN APPLICATOR INTO VAGINA TWICE WEEKLY AT BEDTIME  5  . FLUoxetine (PROZAC) 40 MG capsule Take by mouth daily.  0  . fluticasone (FLONASE) 50 MCG/ACT nasal spray Place 2 sprays into both nostrils daily. 1 g 0  . furosemide (LASIX) 40 MG tablet TAKE 1 TABLET BY MOUTH TWICE A DAY 60 tablet 6  . glucose blood (ACCU-CHEK GUIDE) test strip Check blood sugar daily 50 each 5  . guaiFENesin 200 MG  tablet Take 1 tablet (200 mg total) by mouth every 4 (four) hours as needed for cough or to loosen phlegm. 30 suppository 0  . ipratropium (ATROVENT HFA) 17 MCG/ACT inhaler Inhale 2 puffs into the lungs 4 (four) times daily as needed for wheezing. 12.9 g 6  . ipratropium (ATROVENT) 0.06 % nasal spray Place 2 sprays into both nostrils 4 (four) times daily. 15 mL 0  . KLOR-CON M10 10 MEQ tablet TAKE 2 TABLETS BY MOUTH EVERY DAY 60 tablet 5  . lactulose (KRISTALOSE) 20 g packet Take 1 packet (20 g total) by mouth daily as needed (constipation). 30 each 5  . levocetirizine (XYZAL) 5 MG tablet TAKE 1 TABLET BY MOUTH EVERY EVENING 90 tablet 3  . levothyroxine (SYNTHROID) 88 MCG tablet TAKE 1 TABLET BY MOUTH EVERY DAY BEFORE BREAKFAST 90 tablet 3  .  LYRICA 75 MG capsule Take 1 capsule (75 mg total) by mouth 2 (two) times daily. 60 capsule 5  . metFORMIN (GLUCOPHAGE-XR) 500 MG 24 hr tablet TAKE 2 TABLETS BY MOUTH TWICE A DAY 360 tablet 3  . naproxen (NAPROSYN) 500 MG tablet TAKE 1 TABLET (500 MG TOTAL) BY MOUTH 2 (TWO) TIMES DAILY WITH A MEAL. 60 tablet 0  . nystatin (NYSTATIN) powder Apply topically 4 (four) times daily. 56.7 g 5  . pravastatin (PRAVACHOL) 10 MG tablet TAKE 1 TABLET (10 MG TOTAL) BY MOUTH EVERY EVENING. 90 tablet 3  . SYMBICORT 160-4.5 MCG/ACT inhaler TAKE 2 PUFFS BY MOUTH TWICE A DAY 30.6 Inhaler 1   No current facility-administered medications on file prior to visit.    DIETARY INTAKE: Usual eating pattern includes 2-3 meals and 1-2 snacks per day. Everyday foods include oatmeal, grits, eggs, chicken baked, yogurt, vegetables.    24-hr recall:  B (11AM - 2 PM):Coffee with small amount sugar free creamer 12 PM- oatmeal, blueberries D (8 PM): pasta, lean hamburger,  Beverages:  water, v-8 with fiber  Progress Towards Goal(s):  Some progress.   Nutritional Diagnosis:  [place]-2.2 Altered nutrition-related laboratory As related to elevated blood sugars appears to be improving  As evidenced by decreased blood sugars ( and a1c) and weight loss.     Intervention:  Nutrition education about lower carb food choices,  her new goal for continued weight loss. Coordination of care: consider adding SGLT2 or GLP-1 if patient agrees to metformin if needed   Teaching Method Utilized: Visual, Auditory and Hands on Handouts given during visit include:healhty cracker label,  avs Barriers to learning/adherence to lifestyle change: competing values Demonstrated degree of understanding via:  Teach Back   Monitoring/Evaluation:  Dietary intake, exercise, meter, and body weight in 6 week(s). Debera Lat, RD 07/03/2018 9:47 AM.

## 2018-07-03 NOTE — Progress Notes (Signed)
   CC: Bilateral ear pain  HPI:  [name] is a 57 y.o. with essential hypertension, hypothyroidism, diabetes mellitus type 2, osteoarthritis of left knee, and generalized anziety disorder who presents with ear pain. Please see problem based charting for evaluation, assessment, and plan.  Past Medical History:  Diagnosis Date  . Chronic interstitial cystitis    Followed by Dr. Marcine MatarStephen Gray  . COPD (chronic obstructive pulmonary disease) (HCC)   . DDD (degenerative disc disease), lumbar   . Degeneration of cervical intervertebral disc   . Depression   . Diabetes mellitus, type 2 (HCC) 2007  . Heme positive stool    EGD on 04/08/2010 by Dr. Ewing Gray showed chronic gastritis and a few gastric polyps; pathology showed a fundic gland polyp.  Colonoscopy on 04/08/2010 showed small external and internal hemorrhoids, and a few benign-appearing diminutive polyps in the rectum, the distal sigmoid colon, and in the distal descending colon; pathology showed hyperplastic polyps.   . Hypertension   . Hypothyroidism   . Iron deficiency anemia   . Spinal stenosis    Review of Systems:   Has dizziness, ringing in ears, blurry vision, headaches, sneezing, shortness of breath, wheezing, dry cough  Denies any recent falls, diarrhea, chest pain  Physical Exam:  Vitals:   07/03/18 0937  BP: 130/66  Pulse: 94  Temp: 98.1 F (36.7 C)  TempSrc: Oral  SpO2: 97%  Weight: 196 lb 6.4 oz (89.1 kg)   Physical Exam  Constitutional: She appears well-developed and well-nourished. No distress.  HENT:  Head: Normocephalic and atraumatic.  Right Ear: A middle ear effusion is present.  Left Ear: Tympanic membrane is injected. A middle ear effusion is present.  Mouth/Throat: Oropharynx is clear and moist. No oropharyngeal exudate.  Mastoid and sphenoid sinus tenderness to palpation  Cardiovascular: Normal rate, regular rhythm and normal heart sounds.  Respiratory: Effort normal and breath sounds  normal. No respiratory distress. She has no wheezes.  GI: Soft. Bowel sounds are normal. She exhibits no distension. There is no tenderness.  Musculoskeletal: She exhibits no edema.  Neurological: She is alert.  Skin: She is not diaphoretic. No erythema.  Psychiatric: She has a normal mood and affect. Her behavior is normal. Judgment and thought content normal.    Assessment & Plan:   See Encounters Tab for problem based charting.  Bilateral Otitis Media  Presented with one-week history of bilateral ear pain.  She states that initially the pain started on in her left ear and then subsequently is now also in her right ear.  She states that the pain has been constant and has not been accompanied with any ear discharge.  Recently the patient has been treated for upper respiratory infection with prednisone and azithromycin which she finished the course of on 06/26/2018.  Assessment and plan On physical exam left ear had injected tympanic membrane and bilateral ears had effusions. The patient does not have significant ear discharge and unlikely to have malignant otitis media. The patient's ear pain is likely due to otitis media and therefore she will be treated with cefdinir 300mg  bid for 5 days since she has an allergy to penicillin. She states that she had swelling in her face and runny nose the last time she used amoxicillin. Recommended the patient to return to clinic if her pain does not resolve in the next week.   Patient discussed with Dr. Heide Gray

## 2018-07-03 NOTE — Patient Instructions (Addendum)
It was a pleasure to see you today Mary Gray. Based on your physical exam today it appears that you have an ear infection called otitis media. I have prescribed an antibiotic for you- cefdinir 300mg  twice daily for 5 days.   If you have any questions or concerns, please call our clinic at [phone] between 9am-5pm and after hours call [phone] and ask for the internal medicine resident on call. If you feel you are having a medical emergency please call 911.   Thank you, we look forward to help you remain healthy!  Lorenso CourierVahini Dottie Vaquerano, MD Internal Medicine PGY2   Otitis Media, Adult Otitis media occurs when there is inflammation and fluid in the middle ear. Your middle ear is a part of the ear that contains bones for hearing as well as air that helps send sounds to your brain. What are the causes? This condition is caused by a blockage in the eustachian tube. This tube drains fluid from the ear to the back of the nose (nasopharynx). A blockage in this tube can be caused by an object or by swelling (edema) in the tube. Problems that can cause a blockage include:  A cold or other upper respiratory infection.  Allergies.  An irritant, such as tobacco smoke.  Enlarged adenoids. The adenoids are areas of soft tissue located high in the back of the throat, behind the nose and the roof of the mouth.  A mass in the nasopharynx.  Damage to the ear caused by pressure changes (barotrauma).  What are the signs or symptoms? Symptoms of this condition include:  Ear pain.  A fever.  Decreased hearing.  A headache.  Tiredness (lethargy).  Fluid leaking from the ear.  Ringing in the ear.  How is this diagnosed? This condition is diagnosed with a physical exam. During the exam your health care provider will use an instrument called an otoscope to look into your ear and check for redness, swelling, and fluid. He or she will also ask about your symptoms. Your health care provider may also  order tests, such as:  A test to check the movement of the eardrum (pneumatic otoscopy). This test is done by squeezing a small amount of air into the ear.  A test that changes air pressure in the middle ear to check how well the eardrum moves and whether the eustachian tube is working (tympanogram).  How is this treated? This condition usually goes away on its own within 3-5 days. But if the condition is caused by a bacteria infection and does not go away own its own, or keeps coming back, your health care provider may:  Prescribe antibiotic medicines to treat the infection.  Prescribe or recommend medicines to control pain.  Follow these instructions at home:  Take over-the-counter and prescription medicines only as told by your health care provider.  If you were prescribed an antibiotic medicine, take it as told by your health care provider. Do not stop taking the antibiotic even if you start to feel better.  Keep all follow-up visits as told by your health care provider. This is important. Contact a health care provider if:  You have bleeding from your nose.  There is a lump on your neck.  You are not getting better in 5 days.  You feel worse instead of better. Get help right away if:  You have severe pain that is not controlled with medicine.  You have swelling, redness, or pain around your ear.  You have stiffness  in your neck.  A part of your face is paralyzed.  The bone behind your ear (mastoid) is tender when you touch it.  You develop a severe headache. Summary  Otitis media is redness, soreness, and swelling of the middle ear.  This condition usually goes away on its own within 3-5 days.  If the problem does not go away in 3-5 days, your health care provider may prescribe or recommend medicines to treat your symptoms.  If you were prescribed an antibiotic medicine, take it as told by your health care provider. This information is not intended to replace  advice given to you by your health care provider. Make sure you discuss any questions you have with your health care provider. Document Released: 08/11/2004 Document Revised: 10/27/2016 Document Reviewed: 10/27/2016 Elsevier Interactive Patient Education  [employer]2018 [employer]

## 2018-07-04 MED ORDER — METFORMIN HCL ER 500 MG PO TB24: 1000.0000 mg | ORAL_TABLET | Freq: Two times a day (BID) | ORAL | 3 refills | Status: DC

## 2018-07-04 MED ORDER — ENALAPRIL MALEATE 5 MG PO TABS: 5.0000 mg | ORAL_TABLET | Freq: Every day | ORAL | 3 refills | Status: DC

## 2018-07-04 NOTE — Progress Notes (Signed)
Internal Medicine Clinic Attending  Case discussed with Dr. Chundi at the time of the visit.  We reviewed the resident's history and exam and pertinent patient test results.  I agree with the assessment, diagnosis, and plan of care documented in the resident's note. 

## 2018-07-08 ENCOUNTER — Telehealth: Payer: Self-pay | Admitting: Student in an Organized Health Care Education/Training Program

## 2018-07-08 NOTE — Telephone Encounter (Signed)
Mary Gray is [facility] [phone]

## 2018-07-09 NOTE — Telephone Encounter (Signed)
Thank you for talking with her, I am sorry to hear that she used a raised voice. We will have to keep a close eye on her behavior, I think this phone call is clearly inappropriate. I will include Doris here, and I recommend we send the patient a letter clarifying our expectations about professional interactions. If she cannot keep those expectations, we would need to dismiss her.

## 2018-07-09 NOTE — Telephone Encounter (Signed)
Pt calls and speech is slurred, she is speaking with a raised voice, she rants about not getting her pain med. She mumbles and states maybe her therapist did something to make her urine not show the pain med??? Then talks about eating fruits and vegetables and seeing donnaP. Then she talks about pain management, wants the names and info, gave her both dr crisp and restoration 7 times. States she does not understand why dr Lyla Sonmcneil duncan wont give her pain med??? Corrected her appr 3-4 times on dr vincent's name. She cries and then states in a loud voice you and dr Mahlon Gammonduncan mcneill dont understand what pain is, then she talks about dr Meredith Peljoines and how good the triage nurse has always been to her then ask about pain management info again. This goes on and on for 20mins. [facility], informed pt that it will not be an appt to discuss pain medicine or pain issues.

## 2018-07-11 ENCOUNTER — Encounter: Payer: Self-pay | Admitting: Student in an Organized Health Care Education/Training Program

## 2018-07-11 ENCOUNTER — Ambulatory Visit: Payer: Medicare HMO

## 2018-07-18 ENCOUNTER — Other Ambulatory Visit: Payer: Self-pay | Admitting: Student in an Organized Health Care Education/Training Program

## 2018-07-26 ENCOUNTER — Other Ambulatory Visit: Payer: Self-pay | Admitting: Student in an Organized Health Care Education/Training Program

## 2018-07-29 DIAGNOSIS — M25552 Pain in left hip: Secondary | ICD-10-CM | POA: Diagnosis not present

## 2018-07-29 DIAGNOSIS — M545 Low back pain: Secondary | ICD-10-CM | POA: Diagnosis not present

## 2018-07-29 DIAGNOSIS — M0570 Rheumatoid arthritis with rheumatoid factor of unspecified site without organ or systems involvement: Secondary | ICD-10-CM | POA: Diagnosis not present

## 2018-07-29 DIAGNOSIS — M25562 Pain in left knee: Secondary | ICD-10-CM | POA: Diagnosis not present

## 2018-07-29 DIAGNOSIS — M25551 Pain in right hip: Secondary | ICD-10-CM | POA: Diagnosis not present

## 2018-07-29 DIAGNOSIS — F4312 Post-traumatic stress disorder, chronic: Secondary | ICD-10-CM | POA: Diagnosis not present

## 2018-07-29 DIAGNOSIS — F411 Generalized anxiety disorder: Secondary | ICD-10-CM | POA: Diagnosis not present

## 2018-07-29 DIAGNOSIS — G894 Chronic pain syndrome: Secondary | ICD-10-CM | POA: Diagnosis not present

## 2018-07-29 DIAGNOSIS — F332 Major depressive disorder, recurrent severe without psychotic features: Secondary | ICD-10-CM | POA: Diagnosis not present

## 2018-07-29 DIAGNOSIS — F5101 Primary insomnia: Secondary | ICD-10-CM | POA: Diagnosis not present

## 2018-07-29 DIAGNOSIS — M25561 Pain in right knee: Secondary | ICD-10-CM | POA: Diagnosis not present

## 2018-07-30 NOTE — Telephone Encounter (Signed)
Appt with PCP 09/09/18.

## 2018-07-30 NOTE — Telephone Encounter (Addendum)
Pt states knee pain is not any better; requesting to schedule an appt prefers with her PCP.Call transferred to front office.

## 2018-08-15 ENCOUNTER — Ambulatory Visit: Payer: Medicare HMO | Admitting: Dietician

## 2018-08-20 ENCOUNTER — Other Ambulatory Visit: Payer: Self-pay | Admitting: Student in an Organized Health Care Education/Training Program

## 2018-08-20 DIAGNOSIS — I10 Essential (primary) hypertension: Secondary | ICD-10-CM

## 2018-08-26 DIAGNOSIS — M25551 Pain in right hip: Secondary | ICD-10-CM | POA: Diagnosis not present

## 2018-08-26 DIAGNOSIS — M0570 Rheumatoid arthritis with rheumatoid factor of unspecified site without organ or systems involvement: Secondary | ICD-10-CM | POA: Diagnosis not present

## 2018-08-26 DIAGNOSIS — M25562 Pain in left knee: Secondary | ICD-10-CM | POA: Diagnosis not present

## 2018-08-26 DIAGNOSIS — M25561 Pain in right knee: Secondary | ICD-10-CM | POA: Diagnosis not present

## 2018-08-26 DIAGNOSIS — M25552 Pain in left hip: Secondary | ICD-10-CM | POA: Diagnosis not present

## 2018-08-26 DIAGNOSIS — Z79891 Long term (current) use of opiate analgesic: Secondary | ICD-10-CM | POA: Diagnosis not present

## 2018-08-26 DIAGNOSIS — M545 Low back pain: Secondary | ICD-10-CM | POA: Diagnosis not present

## 2018-08-26 DIAGNOSIS — G894 Chronic pain syndrome: Secondary | ICD-10-CM | POA: Diagnosis not present

## 2018-08-27 ENCOUNTER — Telehealth: Payer: Self-pay | Admitting: Dietician

## 2018-08-27 NOTE — Telephone Encounter (Signed)
Eating more vegetables, no greasy foods, cut down on fruits, but still eating some but pours juice off. Weighed 186# recently. Having car trouble, so missed hr Medical Nutrition Therapy follow up. Rescheduled her appointment.

## 2018-09-04 DIAGNOSIS — E119 Type 2 diabetes mellitus without complications: Secondary | ICD-10-CM | POA: Diagnosis not present

## 2018-09-04 DIAGNOSIS — H524 Presbyopia: Secondary | ICD-10-CM | POA: Diagnosis not present

## 2018-09-04 DIAGNOSIS — Z0100 Encounter for examination of eyes and vision without abnormal findings: Secondary | ICD-10-CM | POA: Diagnosis not present

## 2018-09-04 DIAGNOSIS — H52223 Regular astigmatism, bilateral: Secondary | ICD-10-CM | POA: Diagnosis not present

## 2018-09-04 DIAGNOSIS — Z7984 Long term (current) use of oral hypoglycemic drugs: Secondary | ICD-10-CM | POA: Diagnosis not present

## 2018-09-04 DIAGNOSIS — H52203 Unspecified astigmatism, bilateral: Secondary | ICD-10-CM | POA: Diagnosis not present

## 2018-09-09 ENCOUNTER — Ambulatory Visit (INDEPENDENT_AMBULATORY_CARE_PROVIDER_SITE_OTHER): Payer: Medicare HMO | Admitting: Dietician

## 2018-09-09 ENCOUNTER — Encounter: Payer: Self-pay | Admitting: Dietician

## 2018-09-09 ENCOUNTER — Other Ambulatory Visit: Payer: Self-pay | Admitting: Student in an Organized Health Care Education/Training Program

## 2018-09-09 ENCOUNTER — Ambulatory Visit (INDEPENDENT_AMBULATORY_CARE_PROVIDER_SITE_OTHER): Payer: Medicare HMO | Admitting: Student in an Organized Health Care Education/Training Program

## 2018-09-09 ENCOUNTER — Encounter: Payer: Self-pay | Admitting: Student in an Organized Health Care Education/Training Program

## 2018-09-09 ENCOUNTER — Other Ambulatory Visit: Payer: Self-pay

## 2018-09-09 VITALS — BP 125/74 | HR 86 | Temp 98.6°F | Ht 61.0 in | Wt 184.6 lb

## 2018-09-09 DIAGNOSIS — E118 Type 2 diabetes mellitus with unspecified complications: Secondary | ICD-10-CM | POA: Diagnosis not present

## 2018-09-09 DIAGNOSIS — F419 Anxiety disorder, unspecified: Secondary | ICD-10-CM

## 2018-09-09 DIAGNOSIS — E1165 Type 2 diabetes mellitus with hyperglycemia: Secondary | ICD-10-CM

## 2018-09-09 DIAGNOSIS — Z79891 Long term (current) use of opiate analgesic: Secondary | ICD-10-CM | POA: Diagnosis not present

## 2018-09-09 DIAGNOSIS — R110 Nausea: Secondary | ICD-10-CM

## 2018-09-09 DIAGNOSIS — N393 Stress incontinence (female) (male): Secondary | ICD-10-CM

## 2018-09-09 DIAGNOSIS — Z713 Dietary counseling and surveillance: Secondary | ICD-10-CM | POA: Diagnosis not present

## 2018-09-09 DIAGNOSIS — M48061 Spinal stenosis, lumbar region without neurogenic claudication: Secondary | ICD-10-CM | POA: Diagnosis not present

## 2018-09-09 DIAGNOSIS — N3946 Mixed incontinence: Secondary | ICD-10-CM

## 2018-09-09 DIAGNOSIS — E1140 Type 2 diabetes mellitus with diabetic neuropathy, unspecified: Secondary | ICD-10-CM

## 2018-09-09 DIAGNOSIS — I10 Essential (primary) hypertension: Secondary | ICD-10-CM | POA: Diagnosis not present

## 2018-09-09 DIAGNOSIS — Z6834 Body mass index (BMI) 34.0-34.9, adult: Secondary | ICD-10-CM | POA: Diagnosis not present

## 2018-09-09 DIAGNOSIS — R6889 Other general symptoms and signs: Secondary | ICD-10-CM | POA: Diagnosis not present

## 2018-09-09 DIAGNOSIS — IMO0002 Reserved for concepts with insufficient information to code with codable children: Secondary | ICD-10-CM

## 2018-09-09 DIAGNOSIS — Z23 Encounter for immunization: Secondary | ICD-10-CM

## 2018-09-09 DIAGNOSIS — Z7984 Long term (current) use of oral hypoglycemic drugs: Secondary | ICD-10-CM

## 2018-09-09 DIAGNOSIS — B372 Candidiasis of skin and nail: Secondary | ICD-10-CM

## 2018-09-09 DIAGNOSIS — Z79899 Other long term (current) drug therapy: Secondary | ICD-10-CM

## 2018-09-09 LAB — POCT GLYCOSYLATED HEMOGLOBIN (HGB A1C): Hemoglobin A1C: 6.2 % — AB (ref 4.0–5.6)

## 2018-09-09 LAB — GLUCOSE, CAPILLARY: GLUCOSE-CAPILLARY: 151 mg/dL — AB (ref 70–99)

## 2018-09-09 MED ORDER — HYDROCODONE-ACETAMINOPHEN 7.5-325 MG PO TABS: 1.0000 | ORAL_TABLET | Freq: Four times a day (QID) | ORAL | 0 refills | Status: DC | PRN

## 2018-09-09 MED ORDER — FUROSEMIDE 40 MG PO TABS: 40.0000 mg | ORAL_TABLET | Freq: Every day | ORAL | 3 refills | Status: DC

## 2018-09-09 MED ORDER — POTASSIUM CHLORIDE CRYS ER 10 MEQ PO TBCR: 20.0000 meq | EXTENDED_RELEASE_TABLET | Freq: Every day | ORAL | 5 refills | Status: DC

## 2018-09-09 NOTE — Assessment & Plan Note (Signed)
Stress incontinence, though curiously patient with no prior vaginal deliveries. I am going to reduce Lasix from 40mg  BID to just daily, she has no LE edema today, and now that she is losing weight we may be able to stop altogether in the future. I completed an order for home supplies. She has a referral to see urology that is active, just struggling to make it to the first appointment.

## 2018-09-09 NOTE — Addendum Note (Signed)
Addended by: Maura Crandall on: 09/09/2018 01:21 PM   Modules accepted: Orders

## 2018-09-09 NOTE — Assessment & Plan Note (Signed)
Excellent control today with A1c of 6.2%. This is in large part due to better nutrition and 25lb pound weight loss over the last 4 months. Plan is to continue with current healthy nutrition and increased activity. Continue with metformin xr 1000mg  bid. Enalapril for renal protection. Pravastatin for primary prevention of ischemic vascular disease.

## 2018-09-09 NOTE — Assessment & Plan Note (Signed)
Patient has established care with [facility] in [place]. They are treating with Norco 7.5mg  TID, #90 for one month supply. I will defer to them.

## 2018-09-09 NOTE — Patient Instructions (Addendum)
Mary Gray,   Your lifestyle change- healhtier eating and more activity have resulted it eh following  Lower weight:  Decreased 25.8# in 4 months,   Weight 221.8#  To 211# to _______   Lower blood sugars:   A1C- 9.5  To 7.3 to  6.2  Food tips: Add 2-3 teaspoons chia seeds or ground flax seeds to meal replacements each day  Try to continue eat at least 2-3 servings of vegetables every day.   Try to continue to eat 2-3 servings of fruits each day.  Activity Tips: Do 3 leg raises every day for a count of 10 - in bed can be a good time. As it is easier can do more or hold longer  Follow up in 2-3 months.   [name] [phone]

## 2018-09-09 NOTE — Assessment & Plan Note (Signed)
Blood pressure well controlled today. Plan to continue with low dose enalapril.

## 2018-09-09 NOTE — Progress Notes (Signed)
   Assessment and Plan:  See Encounters tab for problem-based medical decision making.   __________________________________________________________  HPI:   57 year old woman here for follow up of diabetes. She reports doing well, has been seeing [name] with nutrition at [facility] every month. Made a lot of changes to her diet, not eating any fast food, more vegetables and fiber, and as a result is losing weight nicely. Down to 184lbs today, from 211lbs when I last saw her four months ago. Blood sugar has been stable at home. She feels better, more energy, back pain is improved, more active. Complaints today are of urinary incontinence, has been a chronic issue, she says it is related to all her medications. She feels urinary urgency, and has stress leakage with coughing, and walking. She reports seeing a urologist in the past, trying to re-establish but having trouble with making the first appointment. Still has a lot of home stress, living with a friend, her sister passed away earlier this year, their electicity recently cut off, has transportation problems. All the stress does cause her to have nausea and anxiety. She see [facility] for [facility] pain and is now being treated with Norco again.  __________________________________________________________  Problem List: Patient Active Problem List   Diagnosis Date Noted  . Spinal stenosis of lumbar region 03/01/2011    Priority: High  . Diabetes mellitus type 2, uncontrolled (HCC) 07/06/2006    Priority: High  . Generalized anxiety disorder 07/06/2006    Priority: High  . AVN (avascular necrosis of bone) (HCC) 03/07/2013    Priority: Medium  . Hypothyroidism 07/06/2006    Priority: Medium  . Essential hypertension 07/06/2006    Priority: Medium  . Urinary, incontinence, stress female 09/09/2018    Priority: Low  . Osteoarthritis of left knee 05/01/2018    Priority: Low  . Chronic venous insufficiency 02/26/2017    Priority: Low  .  Candidal skin infection 03/03/2014    Priority: Low  . Preventative health care 05/14/2013    Priority: Low  . Hyperlipemia 07/06/2006    Priority: Low  . GERD 07/06/2006    Priority: Low    Medications: Reconciled today in Epic __________________________________________________________  Physical Exam:  Vital Signs: Vitals:   09/09/18 1052  BP: 125/74  Pulse: 86  Temp: 98.6 F (37 C)  TempSrc: Oral  SpO2: 98%  Weight: 184 lb 9.6 oz (83.7 kg)  Height: 5\' 1"  (1.549 m)    Gen: Well appearing, NAD CV: RRR, no murmurs Pulm: Normal effort, CTA throughout, no wheezing Ext: Warm, no edema, normal joints Psych: Normal affect, not anxious or depressed appearing

## 2018-09-09 NOTE — Progress Notes (Signed)
  Medical Nutrition Therapy:  Appt start time: 1035 end time:  1114 Visit # 6  Assessment:  Primary concerns today: glycemic control and weight loss  Ms. Giannattasio continues with weight loss to improve her glycemic control and has lost 37.2# in the past 6 months intentionally. She reached her goal weight of <190# her new goal is 145#.  Blood sugar:  151 after coffee today.  a1c 9.5% to 7.3% to 6.2% today after 6 months MNT Diet: more vegetables and fruits, no fried foods, meal replacements, water, v-8 with fiber, got tired of greek yogurt Physical activity: She is still having problems moving more due to back pain.   ANTHROPOMETRICS: Estimated body mass index is 34.88 kg/m as calculated from the following:   Height as of an earlier encounter on 09/09/18: 5\' 1"  (1.549 m).   Weight as of an earlier encounter on 09/09/18: 184 lb 9.6 oz (83.7 kg).  Wt Readings from Last 5 Encounters:  09/09/18 184 lb 9.6 oz (83.7 kg)  07/03/18 196 lb 6.4 oz (89.1 kg)  06/24/18 201 lb 14.4 oz (91.6 kg)  06/10/18 202 lb 11.2 oz (91.9 kg)  05/16/18 208 lb 12.8 oz (94.7 kg)   Progress Towards Goal(s):  Some progress.   Nutritional Diagnosis:  [place]-2.2 Altered nutrition-related laboratory As related to elevated blood sugars appears to be improving  As evidenced by decreased blood sugars ( and a1c) and weight loss.      Intervention:  Nutrition support and review of lower carb and higher fiber food choices, encouraged core stretching and strengthening, new goal for continued weight loss set. Coordination of care: none  Teaching Method Utilized: Visual, Auditory and Hands on Handouts given during visit include:healhty cracker label,  avs Barriers to learning/adherence to lifestyle change: competing values Demonstrated degree of understanding via:  Teach Back   Monitoring/Evaluation:  Dietary intake, exercise, meter, and body weight 2-3 months . Norm Parcel, RD 09/09/2018 11:15 AM.

## 2018-09-10 ENCOUNTER — Telehealth: Payer: Self-pay | Admitting: Student in an Organized Health Care Education/Training Program

## 2018-09-10 DIAGNOSIS — R32 Unspecified urinary incontinence: Secondary | ICD-10-CM | POA: Diagnosis not present

## 2018-09-10 DIAGNOSIS — K219 Gastro-esophageal reflux disease without esophagitis: Secondary | ICD-10-CM | POA: Diagnosis not present

## 2018-09-10 DIAGNOSIS — I10 Essential (primary) hypertension: Secondary | ICD-10-CM | POA: Diagnosis not present

## 2018-09-10 LAB — VITAMIN B12: Vitamin B-12: 269 pg/mL (ref 232–1245)

## 2018-09-10 MED ORDER — VITAMIN B-12 1000 MCG PO TABS: 1000.0000 ug | ORAL_TABLET | Freq: Every day | ORAL | 3 refills | Status: DC

## 2018-09-10 NOTE — Telephone Encounter (Signed)
Vitamin B12 level is low, which may be contributing to her overall fatigue. I have prescribed a B12 supplement, the patient should take one tablet daily. We can recheck B12 levels at her next blood draw.   Mary Gray, can you call and let the patient know please?

## 2018-09-11 NOTE — Telephone Encounter (Signed)
Received return call from patient-she was made aware of lab results and new rx for vit b12.  Pt verbalized understanding and had no further questions. Phone call complete.Mary Spittle Cassady10/23/201910:42 AM

## 2018-09-12 DIAGNOSIS — R3914 Feeling of incomplete bladder emptying: Secondary | ICD-10-CM | POA: Diagnosis not present

## 2018-09-12 DIAGNOSIS — R300 Dysuria: Secondary | ICD-10-CM | POA: Diagnosis not present

## 2018-09-12 DIAGNOSIS — R3915 Urgency of urination: Secondary | ICD-10-CM | POA: Diagnosis not present

## 2018-09-24 DIAGNOSIS — F3181 Bipolar II disorder: Secondary | ICD-10-CM | POA: Diagnosis not present

## 2018-09-24 DIAGNOSIS — F4312 Post-traumatic stress disorder, chronic: Secondary | ICD-10-CM | POA: Diagnosis not present

## 2018-09-24 DIAGNOSIS — F411 Generalized anxiety disorder: Secondary | ICD-10-CM | POA: Diagnosis not present

## 2018-09-24 DIAGNOSIS — F332 Major depressive disorder, recurrent severe without psychotic features: Secondary | ICD-10-CM | POA: Diagnosis not present

## 2018-10-01 ENCOUNTER — Other Ambulatory Visit: Payer: Self-pay | Admitting: Student in an Organized Health Care Education/Training Program

## 2018-10-15 DIAGNOSIS — M25552 Pain in left hip: Secondary | ICD-10-CM | POA: Diagnosis not present

## 2018-10-15 DIAGNOSIS — M25561 Pain in right knee: Secondary | ICD-10-CM | POA: Diagnosis not present

## 2018-10-15 DIAGNOSIS — M25562 Pain in left knee: Secondary | ICD-10-CM | POA: Diagnosis not present

## 2018-10-15 DIAGNOSIS — M545 Low back pain: Secondary | ICD-10-CM | POA: Diagnosis not present

## 2018-10-15 DIAGNOSIS — M0570 Rheumatoid arthritis with rheumatoid factor of unspecified site without organ or systems involvement: Secondary | ICD-10-CM | POA: Diagnosis not present

## 2018-10-15 DIAGNOSIS — Z79891 Long term (current) use of opiate analgesic: Secondary | ICD-10-CM | POA: Diagnosis not present

## 2018-10-15 DIAGNOSIS — G894 Chronic pain syndrome: Secondary | ICD-10-CM | POA: Diagnosis not present

## 2018-10-15 DIAGNOSIS — M25551 Pain in right hip: Secondary | ICD-10-CM | POA: Diagnosis not present

## 2018-10-22 ENCOUNTER — Other Ambulatory Visit: Payer: Self-pay | Admitting: Student in an Organized Health Care Education/Training Program

## 2018-10-22 DIAGNOSIS — E1165 Type 2 diabetes mellitus with hyperglycemia: Principal | ICD-10-CM

## 2018-10-22 DIAGNOSIS — E1140 Type 2 diabetes mellitus with diabetic neuropathy, unspecified: Secondary | ICD-10-CM

## 2018-10-22 DIAGNOSIS — IMO0002 Reserved for concepts with insufficient information to code with codable children: Secondary | ICD-10-CM

## 2018-10-24 ENCOUNTER — Telehealth: Payer: Self-pay | Admitting: *Deleted

## 2018-10-24 NOTE — Telephone Encounter (Signed)
Lennox LaityJodi, CMA with [facility] called asking if patient was on medication for RA. Explained patient is not and RA is not listed on patient's problem list. No record of patient following with rheumatology in Epic. Kinnie FeilL. Ducatte, RN, BSN

## 2018-10-24 NOTE — Telephone Encounter (Signed)
Correct. I do not think she has RA.

## 2018-10-25 ENCOUNTER — Ambulatory Visit (HOSPITAL_COMMUNITY)
Admission: EM | Admit: 2018-10-25 | Discharge: 2018-10-25 | Disposition: A | Discharge status: Home / Self Care | Payer: Medicare HMO | Attending: Family Medicine | Admitting: Family Medicine

## 2018-10-25 ENCOUNTER — Encounter (HOSPITAL_COMMUNITY): Payer: Self-pay

## 2018-10-25 DIAGNOSIS — R6889 Other general symptoms and signs: Secondary | ICD-10-CM | POA: Diagnosis not present

## 2018-10-25 DIAGNOSIS — J449 Chronic obstructive pulmonary disease, unspecified: Secondary | ICD-10-CM | POA: Diagnosis not present

## 2018-10-25 DIAGNOSIS — E1140 Type 2 diabetes mellitus with diabetic neuropathy, unspecified: Secondary | ICD-10-CM

## 2018-10-25 DIAGNOSIS — E1165 Type 2 diabetes mellitus with hyperglycemia: Secondary | ICD-10-CM

## 2018-10-25 DIAGNOSIS — IMO0002 Reserved for concepts with insufficient information to code with codable children: Secondary | ICD-10-CM

## 2018-10-25 MED ORDER — ONDANSETRON HCL 4 MG PO TABS: 4.0000 mg | ORAL_TABLET | Freq: Four times a day (QID) | ORAL | 0 refills | Status: DC

## 2018-10-25 MED ORDER — CETIRIZINE-PSEUDOEPHEDRINE ER 5-120 MG PO TB12: 1.0000 | ORAL_TABLET | Freq: Every day | ORAL | 0 refills | Status: DC

## 2018-10-25 MED ORDER — GLUCOSE BLOOD VI STRP: ORAL_STRIP | 3 refills | Status: DC

## 2018-10-25 NOTE — Discharge Instructions (Signed)
Glucose strips refilled.  Use as directed to check blood sugar Declines tamiflu at this time, would like to try symptoms management Get plenty of rest and push fluids Zyrtec-D prescribed for nasal congestion, runny nose, and/or sore throat Continue with flonase daily Zofran prescribed.  Use as needed for nausea.   Use OTC medications like ibuprofen or tylenol as needed fever or pain Follow up with PCP next week for recheck and/or if symptoms persist Return or go to ER if you have any new or worsening symptoms fever, chills, nausea, vomiting, chest pain, cough, shortness of breath, wheezing, abdominal pain, changes in bowel or bladder habits, etc...Marland Kitchen

## 2018-10-25 NOTE — ED Provider Notes (Signed)
[place]   [id number] 10/25/18 Arrival Time: 5573   CC: FLU-like symptoms  SUBJECTIVE: History from: patient.  Mary Gray is a 57 y.o. female who presents with abrupt onset of fatigue, body aches, HA, sinus pressure, ear pain, nasal congestion, runny nose, sore throat, cough, nausea, and diarrhea x 3 days.  Denies positive sick exposure.  Has tried OTC medications without relief.  Denies aggravating factors.  Reports previous symptoms in the past.   Denies  SOB, wheezing, chest pain, urinary symptoms or constipation.    Received flu shot this year: yes.  ROS: As per HPI.  Past Medical History:  Diagnosis Date  . Chronic interstitial cystitis    Followed by Dr. Franchot Gallo  . COPD (chronic obstructive pulmonary disease) ([place])   . DDD (degenerative disc disease), lumbar   . Degeneration of cervical intervertebral disc   . Depression   . Diabetes mellitus, type 2 ([place]) 2007  . Heme positive stool    EGD on 04/08/2010 by Dr. Watt Climes showed chronic gastritis and a few gastric polyps; pathology showed a fundic gland polyp.  Colonoscopy on 04/08/2010 showed small external and internal hemorrhoids, and a few benign-appearing diminutive polyps in the rectum, the distal sigmoid colon, and in the distal descending colon; pathology showed hyperplastic polyps.   . Hypertension   . Hypothyroidism   . Iron deficiency anemia   . Spinal stenosis    Past Surgical History:  Procedure Laterality Date  . CARPAL TUNNEL RELEASE  05/08/2000   By Dr. Youlanda Mighty. Sypher, Brooke Bonito.   Allergies  Allergen Reactions  . Codeine Sulfate Other (See Comments)    Confusion, anxiety, fatigue  . Meperidine And Related Nausea And Vomiting  . Minocycline Hcl Nausea And Vomiting    Hospitalized for vomiting along with allergic reaction  . Penicillins Other (See Comments)    Has patient had a PCN reaction causing immediate rash, facial/tongue/throat swelling, SOB or lightheadedness with  hypotension: Yes Has patient had a PCN reaction causing severe rash involving mucus membranes or skin necrosis: Yes Has patient had a PCN reaction that required hospitalization Yes Has patient had a PCN reaction occurring within the last 10 years: Yes If all of the above answers are "NO", then may proceed with Cephalosporin use.   Marland Kitchen Propoxyphene N-Acetaminophen Other (See Comments)    confused  . Tetracycline Hcl Other (See Comments)    Vomiting Note: Patient took a course of doxycycline in June 2014 without any problems.   No current facility-administered medications on file prior to encounter.    Current Outpatient Medications on File Prior to Encounter  Medication Sig Dispense Refill  . ACCU-CHEK FASTCLIX LANCETS MISC Check blood sugar daily 102 each 5  . acetaminophen (TYLENOL) 325 MG tablet Take 650 mg by mouth every 6 (six) hours as needed.    Marland Kitchen albuterol (PROAIR HFA) 108 (90 BASE) MCG/ACT inhaler Inhale 2 puffs into the lungs every 6 (six) hours as needed for wheezing or shortness of breath. 8.5 each 11  . ALPRAZolam (XANAX) 1 MG tablet Take 1 tablet (1 mg total) by mouth 4 (four) times daily as needed for anxiety. 30 tablet 0  . amitriptyline (ELAVIL) 25 MG tablet Take 2 tablets (50 mg total) by mouth at bedtime. 90 tablet 3  . ARIPiprazole (ABILIFY) 10 MG tablet Take 10 mg by mouth daily.  0  . BELSOMRA 20 MG TABS 1 TABLET(S) 1 TIME A DAY AT BEDTIME FOR INSOMNIA  0  . Blood  Glucose Monitoring Suppl (ACCU-CHEK GUIDE) w/Device KIT 1 each by Does not apply route daily. 1 kit 0  . diclofenac sodium (VOLTAREN) 1 % GEL Apply 2 g topically 4 (four) times daily. 1 Tube 2  . enalapril (VASOTEC) 5 MG tablet Take 1 tablet (5 mg total) by mouth daily. 90 tablet 3  . esomeprazole (NEXIUM) 40 MG capsule Take 1 capsule (40 mg total) by mouth daily. 30 capsule 5  . estradiol (ESTRACE) 0.1 MG/GM vaginal cream INSERT IN APPLICATOR INTO VAGINA TWICE WEEKLY AT BEDTIME  5  . FLUoxetine (PROZAC) 40 MG  capsule Take by mouth daily.  0  . fluticasone (FLONASE) 50 MCG/ACT nasal spray Place 2 sprays into both nostrils daily. 1 g 0  . furosemide (LASIX) 40 MG tablet Take 1 tablet (40 mg total) by mouth daily. 90 tablet 3  . guaiFENesin 200 MG tablet Take 1 tablet (200 mg total) by mouth every 4 (four) hours as needed for cough or to loosen phlegm. 30 suppository 0  . HYDROcodone-acetaminophen (NORCO) 7.5-325 MG tablet Take 1 tablet by mouth every 6 (six) hours as needed for moderate pain. 90 tablet 0  . ipratropium (ATROVENT) 0.06 % nasal spray PLACE 2 SPRAYS INTO BOTH NOSTRILS 4 (FOUR) TIMES DAILY. 15 mL 5  . lactulose (KRISTALOSE) 20 g packet Take 1 packet (20 g total) by mouth daily as needed (constipation). 30 each 5  . levocetirizine (XYZAL) 5 MG tablet TAKE 1 TABLET BY MOUTH EVERY DAY IN THE EVENING 90 tablet 3  . levothyroxine (SYNTHROID) 88 MCG tablet TAKE 1 TABLET BY MOUTH EVERY DAY BEFORE BREAKFAST 90 tablet 3  . LYRICA 75 MG capsule Take 1 capsule (75 mg total) by mouth 2 (two) times daily. 60 capsule 5  . metFORMIN (GLUCOPHAGE-XR) 500 MG 24 hr tablet Take 2 tablets (1,000 mg total) by mouth 2 (two) times daily. 360 tablet 3  . nystatin (MYCOSTATIN/NYSTOP) powder APPLY TO AFFECTED AREA 4 TIMES A DAY 60 g 3  . potassium chloride (KLOR-CON M10) 10 MEQ tablet Take 2 tablets (20 mEq total) by mouth daily. 60 tablet 5  . pravastatin (PRAVACHOL) 10 MG tablet TAKE 1 TABLET (10 MG TOTAL) BY MOUTH EVERY EVENING. 90 tablet 3  . SYMBICORT 160-4.5 MCG/ACT inhaler TAKE 2 PUFFS BY MOUTH TWICE A DAY 30.6 Inhaler 1  . vitamin B-12 (CYANOCOBALAMIN) 1000 MCG tablet Take 1 tablet (1,000 mcg total) by mouth daily. 90 tablet 3   Social History   Socioeconomic History  . Marital status: Single    Spouse name: Not on file  . Number of children: Not on file  . Years of education: Not on file  . Highest education level: Not on file  Occupational History  . Not on file  Social Needs  . Financial resource  strain: Not on file  . Food insecurity:    Worry: Not on file    Inability: Not on file  . Transportation needs:    Medical: Not on file    Non-medical: Not on file  Tobacco Use  . Smoking status: Former Smoker    Packs/day: 0.50    Years: 15.00    Pack years: 7.50    Types: Cigarettes    Last attempt to quit: 01/18/1996    Years since quitting: 22.7  . Smokeless tobacco: Never Used  Substance and Sexual Activity  . Alcohol use: No    Alcohol/week: 0.0 standard drinks  . Drug use: No  . Sexual activity: Not Currently  Lifestyle  . Physical activity:    Days per week: Not on file    Minutes per session: Not on file  . Stress: Not on file  Relationships  . Social connections:    Talks on phone: Not on file    Gets together: Not on file    Attends religious service: Not on file    Active member of club or organization: Not on file    Attends meetings of clubs or organizations: Not on file    Relationship status: Not on file  . Intimate partner violence:    Fear of current or ex partner: Not on file    Emotionally abused: Not on file    Physically abused: Not on file    Forced sexual activity: Not on file  Other Topics Concern  . Not on file  Social History Narrative  . Not on file   Family History  Problem Relation Age of Onset  . Heart attack Father 81  . Diabetes Mother   . Hypertension Mother   . Diabetes Sister   . Breast cancer Neg Hx   . Colon cancer Neg Hx     OBJECTIVE:  Vitals:   10/25/18 1514  BP: 122/81  Pulse: 92  Resp: 18  Temp: 98.2 F (36.8 C)  TempSrc: Oral  SpO2: 96%     General appearance: alert; appears fatigued, but nontoxic; speaking in full sentences and tolerating own secretions HEENT: NCAT; Ears: EACs clear, TMs pearly gray; Eyes: PERRL.  EOM grossly intact. Sinuses: nontender; Nose: nares patent with mild rhinorrhea, Throat: oropharynx clear, tonsils non erythematous or enlarged, uvula midline  Neck: supple without LAD Lungs:  unlabored respirations, symmetrical air entry; cough: mild; no respiratory distress; CTAB Heart: regular rate and rhythm.  Radial pulses 2+ symmetrical bilaterally Skin: warm and dry Psychological: alert and cooperative; depressed mood and affect; tearful and appears anxious during examination  ASSESSMENT & PLAN:  1. Flu-like symptoms   2. Uncontrolled type 2 diabetes mellitus with diabetic neuropathy ([place])     Meds ordered this encounter  Medications  . ondansetron (ZOFRAN) 4 MG tablet    Sig: Take 1 tablet (4 mg total) by mouth every 6 (six) hours.    Dispense:  12 tablet    Refill:  0    Order Specific Question:   Supervising Provider    Answer:   Raylene Everts [[id number]]  . cetirizine-pseudoephedrine (ZYRTEC-D) 5-120 MG tablet    Sig: Take 1 tablet by mouth daily.    Dispense:  30 tablet    Refill:  0    Order Specific Question:   Supervising Provider    Answer:   Raylene Everts [[id number]]  . glucose blood (ACCU-CHEK GUIDE) test strip    Sig: USE TO CHECK BLOOD SUGAR 1 TIME DAILY. E11.65. NON- INSULIN DEPENDENT    Dispense:  100 each    Refill:  3    Order Specific Question:   Supervising Provider    Answer:   Raylene Everts [[id number]]   Glucose strips refilled.  Use as directed to check blood sugar Declines tamiflu at this time, would like to try symptoms management Get plenty of rest and push fluids Zyrtec-D prescribed for nasal congestion, runny nose, and/or sore throat Continue with flonase daily Zofran prescribed.  Use as needed for nausea.   Use OTC medications like ibuprofen or tylenol as needed fever or pain Follow up with PCP next week for recheck and/or if symptoms persist Return  or go to ER if you have any new or worsening symptoms fever, chills, nausea, vomiting, chest pain, cough, shortness of breath, wheezing, abdominal pain, changes in bowel or bladder habits, etc...  Reviewed expectations re: course of current medical issues. Questions  answered. Outlined signs and symptoms indicating need for more acute intervention. Patient verbalized understanding. After Visit Summary given.         Mary Box, PA-C 10/25/18 1906

## 2018-10-25 NOTE — ED Triage Notes (Signed)
Pt presents with flu like symptoms; diarrhea, nausea, generalized body aches, sore throat, chills, and cough.

## 2018-10-28 ENCOUNTER — Other Ambulatory Visit: Payer: Self-pay | Admitting: Student in an Organized Health Care Education/Training Program

## 2018-10-28 DIAGNOSIS — E1140 Type 2 diabetes mellitus with diabetic neuropathy, unspecified: Secondary | ICD-10-CM

## 2018-10-28 DIAGNOSIS — E1165 Type 2 diabetes mellitus with hyperglycemia: Principal | ICD-10-CM

## 2018-10-28 DIAGNOSIS — IMO0002 Reserved for concepts with insufficient information to code with codable children: Secondary | ICD-10-CM

## 2018-10-28 NOTE — Telephone Encounter (Signed)
Pt states her glucose blood (ACCU-CHEK GUIDE) test strip was denied from the pharmacy.  Pt is requesting a call back as soon as possible. [id number] - [place], [place] - [address]

## 2018-10-28 NOTE — Telephone Encounter (Signed)
Her chart shows that her strips were refilled on 10/25/18. [facility] and quantity was 09/09/18 for 100 strips. They cannot refill until after 90 days from her fill date. (~12/09/2018)

## 2018-10-29 MED ORDER — GLUCOSE BLOOD VI STRP: ORAL_STRIP | 1 refills | Status: DC

## 2018-10-29 NOTE — Telephone Encounter (Signed)
spoke with Mary Gray. She checked her blood sugar more than 1x/day and ran out of strips too early. I  encouraged her that she should be fine not checking her blood sugar for a little whilebecause metformin should not cause her blood sugar to be too low and that her diabetes is very well controlled. She also asked if she was okay to be taking her metformin when she is dehydrated. I encouraged her to take her metformin as prescribed unless told otherwise by a doctor. I encouraged hr to concentrate on resting and drinking more fluids and eating right for her diabetes self care.  I also encouraged her to  All the office if she fees she needs to be seen, feels worse and we can check her blood sugar here anytime for her.

## 2018-10-29 NOTE — Telephone Encounter (Signed)
Call from pt - stated she needs test strips. Informed of Donna's response that next refill I not until Jan 20. Stated she cannot pay out of pocket. She asked if Lupita LeashDonna has any strips to give her; Lupita LeashDonna stated she does not have accu-chek test strips. Wanted to talk to Lupita LeashDonna, call transferred.

## 2018-11-04 DIAGNOSIS — F332 Major depressive disorder, recurrent severe without psychotic features: Secondary | ICD-10-CM | POA: Diagnosis not present

## 2018-11-04 DIAGNOSIS — F411 Generalized anxiety disorder: Secondary | ICD-10-CM | POA: Diagnosis not present

## 2018-11-04 DIAGNOSIS — F4312 Post-traumatic stress disorder, chronic: Secondary | ICD-10-CM | POA: Diagnosis not present

## 2018-11-04 DIAGNOSIS — F3181 Bipolar II disorder: Secondary | ICD-10-CM | POA: Diagnosis not present

## 2018-11-19 DIAGNOSIS — F4312 Post-traumatic stress disorder, chronic: Secondary | ICD-10-CM | POA: Diagnosis not present

## 2018-11-19 DIAGNOSIS — F411 Generalized anxiety disorder: Secondary | ICD-10-CM | POA: Diagnosis not present

## 2018-11-19 DIAGNOSIS — F332 Major depressive disorder, recurrent severe without psychotic features: Secondary | ICD-10-CM | POA: Diagnosis not present

## 2018-11-19 DIAGNOSIS — F3181 Bipolar II disorder: Secondary | ICD-10-CM | POA: Diagnosis not present

## 2018-11-25 ENCOUNTER — Ambulatory Visit (INDEPENDENT_AMBULATORY_CARE_PROVIDER_SITE_OTHER): Payer: Medicare HMO | Admitting: Student in an Organized Health Care Education/Training Program

## 2018-11-25 ENCOUNTER — Encounter: Payer: Self-pay | Admitting: Student in an Organized Health Care Education/Training Program

## 2018-11-25 ENCOUNTER — Other Ambulatory Visit: Payer: Self-pay

## 2018-11-25 ENCOUNTER — Ambulatory Visit (INDEPENDENT_AMBULATORY_CARE_PROVIDER_SITE_OTHER): Payer: Medicare HMO | Admitting: Dietician

## 2018-11-25 ENCOUNTER — Encounter: Payer: Self-pay | Admitting: Dietician

## 2018-11-25 VITALS — BP 125/68 | HR 79 | Temp 98.3°F | Wt 177.9 lb

## 2018-11-25 DIAGNOSIS — Z7984 Long term (current) use of oral hypoglycemic drugs: Secondary | ICD-10-CM

## 2018-11-25 DIAGNOSIS — E119 Type 2 diabetes mellitus without complications: Secondary | ICD-10-CM

## 2018-11-25 DIAGNOSIS — Z79899 Other long term (current) drug therapy: Secondary | ICD-10-CM

## 2018-11-25 DIAGNOSIS — B372 Candidiasis of skin and nail: Secondary | ICD-10-CM

## 2018-11-25 DIAGNOSIS — E538 Deficiency of other specified B group vitamins: Secondary | ICD-10-CM | POA: Diagnosis not present

## 2018-11-25 DIAGNOSIS — Z832 Family history of diseases of the blood and blood-forming organs and certain disorders involving the immune mechanism: Secondary | ICD-10-CM

## 2018-11-25 DIAGNOSIS — Z6833 Body mass index (BMI) 33.0-33.9, adult: Secondary | ICD-10-CM | POA: Diagnosis not present

## 2018-11-25 DIAGNOSIS — E118 Type 2 diabetes mellitus with unspecified complications: Secondary | ICD-10-CM | POA: Diagnosis not present

## 2018-11-25 DIAGNOSIS — E1165 Type 2 diabetes mellitus with hyperglycemia: Secondary | ICD-10-CM

## 2018-11-25 DIAGNOSIS — Z713 Dietary counseling and surveillance: Secondary | ICD-10-CM | POA: Diagnosis not present

## 2018-11-25 DIAGNOSIS — M25562 Pain in left knee: Secondary | ICD-10-CM | POA: Diagnosis not present

## 2018-11-25 DIAGNOSIS — G894 Chronic pain syndrome: Secondary | ICD-10-CM | POA: Diagnosis not present

## 2018-11-25 DIAGNOSIS — M25561 Pain in right knee: Secondary | ICD-10-CM | POA: Diagnosis not present

## 2018-11-25 DIAGNOSIS — M545 Low back pain: Secondary | ICD-10-CM | POA: Diagnosis not present

## 2018-11-25 DIAGNOSIS — M25552 Pain in left hip: Secondary | ICD-10-CM | POA: Diagnosis not present

## 2018-11-25 DIAGNOSIS — Z79891 Long term (current) use of opiate analgesic: Secondary | ICD-10-CM | POA: Diagnosis not present

## 2018-11-25 DIAGNOSIS — M0570 Rheumatoid arthritis with rheumatoid factor of unspecified site without organ or systems involvement: Secondary | ICD-10-CM | POA: Diagnosis not present

## 2018-11-25 DIAGNOSIS — M25551 Pain in right hip: Secondary | ICD-10-CM | POA: Diagnosis not present

## 2018-11-25 LAB — POCT GLYCOSYLATED HEMOGLOBIN (HGB A1C): Hemoglobin A1C: 6.1 % — AB (ref 4.0–5.6)

## 2018-11-25 LAB — GLUCOSE, CAPILLARY: Glucose-Capillary: 152 mg/dL — ABNORMAL HIGH (ref 70–99)

## 2018-11-25 MED ORDER — LEVOTHYROXINE SODIUM 88 MCG PO TABS: ORAL_TABLET | ORAL | 3 refills | Status: DC

## 2018-11-25 MED ORDER — METFORMIN HCL ER 500 MG PO TB24: 1000.0000 mg | ORAL_TABLET | Freq: Every day | ORAL | 3 refills | Status: DC

## 2018-11-25 MED ORDER — NYSTATIN 100000 UNIT/GM EX POWD: Freq: Four times a day (QID) | CUTANEOUS | 3 refills | Status: DC

## 2018-11-25 MED ORDER — GLUCOSE BLOOD VI STRP: ORAL_STRIP | 12 refills | Status: DC

## 2018-11-25 NOTE — Assessment & Plan Note (Signed)
Diabetes control is excellent, A1c 6.1% today.  1 symptomatic low over the last few months.Plan to decrease metformin from 2000 mg a day to 1000 mg a day.  This is all owing to good improvements in her nutrition, she is lost 45 pounds over the last 1 year.

## 2018-11-25 NOTE — Progress Notes (Signed)
   Assessment and Plan:  See Encounters tab for problem-based medical decision making.   __________________________________________________________  HPI:   58 year old woman here for follow-up of diabetes.  Reports doing very well over the last few months.  She has been working very closely with our nutritionist and has been doing a great job in making interventions in her diet choices.  She has lost almost 50 pounds in the last 1 year as a result.  She reports good compliance with her medications, says she had one hypoglycemic episode about a month ago.  Blood sugar got down to 55 with symptoms that improved with eating.  Currently taking metformin 2000 mg daily, no other diabetes medications.  No fevers or chills.  She had an upper respiratory tract infection but now resolved.  __________________________________________________________  Problem List: Patient Active Problem List   Diagnosis Date Noted  . Spinal stenosis of lumbar region 03/01/2011    Priority: High  . Diabetes mellitus type 2, uncontrolled (HCC) 07/06/2006    Priority: High  . Generalized anxiety disorder 07/06/2006    Priority: High  . B12 deficiency 11/25/2018    Priority: Medium  . AVN (avascular necrosis of bone) (HCC) 03/07/2013    Priority: Medium  . Hypothyroidism 07/06/2006    Priority: Medium  . Essential hypertension 07/06/2006    Priority: Medium  . Urinary, incontinence, stress female 09/09/2018    Priority: Low  . Osteoarthritis of left knee 05/01/2018    Priority: Low  . Chronic venous insufficiency 02/26/2017    Priority: Low  . Candidal skin infection 03/03/2014    Priority: Low  . Preventative health care 05/14/2013    Priority: Low  . Hyperlipemia 07/06/2006    Priority: Low  . GERD 07/06/2006    Priority: Low    Medications: Reconciled today in Epic __________________________________________________________  Physical Exam:  Vital Signs: Vitals:   11/25/18 1007  BP: 125/68    Pulse: 79  Temp: 98.3 F (36.8 C)  TempSrc: Oral  SpO2: 98%  Weight: 177 lb 14.4 oz (80.7 kg)    Gen: Well appearing, NAD CV: RRR, no murmurs Pulm: Normal effort, CTA throughout, no wheezing Ext: Warm, no edema, normal DP pulses bilaterally Neuro: Alert, oriented, conversational, wide-based gait using a rolling walker, full strength in the upper and lower extremities

## 2018-11-25 NOTE — Patient Instructions (Signed)
You are doing a great job with your diet and weight loss.  He is lost at least 45 pounds in the last 1 year.  Keep up the great work.  We can decrease her metformin to 2 tablets once a day.  Follow-up with me in 3 months and we will continue to keep an eye on her blood sugars and check your other blood work back.

## 2018-11-25 NOTE — Progress Notes (Signed)
  Medical Nutrition Therapy:  Appt start time: 1040end time:  1140 Visit # 7  Assessment:  Primary concerns today: glycemic control and weight loss  Ms. Amparan continues with weight loss to improve her glycemic control. Her weight continued to decline over the past 2-3 months and her A1C also continues to decline. Her diabetes medicine was reduced today. She plans to increase her physical activity and try to incorporate more vegetables into her daily food intake  ANTHROPOMETRICS: Estimated body mass index is 33.61 kg/m as calculated from the following:   Height as of 09/09/18: 5\' 1"  (1.549 m).   Weight as of an earlier encounter on 11/25/18: 177 lb 14.4 oz (80.7 kg).  Wt Readings from Last 5 Encounters:  11/25/18 177 lb 14.4 oz (80.7 kg)  09/09/18 184 lb 9.6 oz (83.7 kg)  07/03/18 196 lb 6.4 oz (89.1 kg)  06/24/18 201 lb 14.4 oz (91.6 kg)  06/10/18 202 lb 11.2 oz (91.9 kg)   Progress Towards Goal(s):  Some progress.   Nutritional Diagnosis:  [place]-2.2 Altered nutrition-related laboratory As related to elevated blood sugars improved but not quite to her stated goal of 5.7%  As evidenced by decreased blood sugars (A1c) and continued weight loss.      Intervention:  Nutrition Education about healthier snacks, incorporating more fiber and vegetables into her diet. Ref[facility] to [employer] [facility] for [facility], used motivational interviewing to assist her with achieving her stated physical activity goals.  Coordination of care: none Teaching Method Utilized: Visual, Auditory and Hands on Handouts given during visit include:coupons and samples pf non nutritive sweeteners, canned vegetables,  After visit summary Barriers to learning/adherence to lifestyle change: competing values Demonstrated degree of understanding via:  Teach Back   Monitoring/Evaluation:  Dietary intake, exercise, meter, and body weight 2-3 months . Norm Parcel, RD 11/25/2018 11:56 AM.

## 2018-11-25 NOTE — Patient Instructions (Addendum)
Goals for 2020:  Food goals: want to gt back on yogurt, boost and ensure, try plain yogurt  Activity goals: more muscle- ? Try yoga, lift light weights- put weight  Follow up: 2-3 months  Lupita LeashDonna ([phone]) [phone]

## 2018-11-25 NOTE — Assessment & Plan Note (Signed)
Stable.  Refilled nystatin powder today.

## 2018-11-25 NOTE — Assessment & Plan Note (Signed)
B12 level checked for fatigue at last visit was low at 269.  Probably autoimmune given family history of B12 deficiency.  She is doing well on oral replacement, symptoms have improved.  Plan to check B12 level in 3 months with the next blood draw.

## 2018-11-28 ENCOUNTER — Telehealth: Payer: Self-pay | Admitting: *Deleted

## 2018-11-28 NOTE — Telephone Encounter (Signed)
Order found in Media from 09/09/2018 for size large sent to [facility]. Sp[facility]Stephen Carroll [facility] and he will have Victorino DikeJennifer call patient to send correct size, possibly medium. Kinnie FeilL. Ducatte, RN, BSN

## 2018-11-28 NOTE — Telephone Encounter (Signed)
Received message from front office that patient in need of smaller incontinence supplies. Spoke with patient. States she's not currently working with any company. States she's been using size XL but has had 40 lb weight loss. Will send request to Anise Salvo at [facility]. Kinnie Feil, RN, BSN

## 2018-11-29 NOTE — Telephone Encounter (Signed)
Ok. Thank you.

## 2018-12-02 DIAGNOSIS — R32 Unspecified urinary incontinence: Secondary | ICD-10-CM | POA: Diagnosis not present

## 2018-12-02 DIAGNOSIS — I10 Essential (primary) hypertension: Secondary | ICD-10-CM | POA: Diagnosis not present

## 2018-12-16 ENCOUNTER — Encounter: Payer: Self-pay | Admitting: *Deleted

## 2018-12-18 DIAGNOSIS — M25562 Pain in left knee: Secondary | ICD-10-CM | POA: Diagnosis not present

## 2018-12-18 DIAGNOSIS — M25561 Pain in right knee: Secondary | ICD-10-CM | POA: Diagnosis not present

## 2018-12-18 DIAGNOSIS — Z79891 Long term (current) use of opiate analgesic: Secondary | ICD-10-CM | POA: Diagnosis not present

## 2018-12-18 DIAGNOSIS — F329 Major depressive disorder, single episode, unspecified: Secondary | ICD-10-CM | POA: Diagnosis not present

## 2018-12-18 DIAGNOSIS — M25551 Pain in right hip: Secondary | ICD-10-CM | POA: Diagnosis not present

## 2018-12-18 DIAGNOSIS — M25552 Pain in left hip: Secondary | ICD-10-CM | POA: Diagnosis not present

## 2018-12-18 DIAGNOSIS — G894 Chronic pain syndrome: Secondary | ICD-10-CM | POA: Diagnosis not present

## 2018-12-18 DIAGNOSIS — M0570 Rheumatoid arthritis with rheumatoid factor of unspecified site without organ or systems involvement: Secondary | ICD-10-CM | POA: Diagnosis not present

## 2018-12-18 DIAGNOSIS — M545 Low back pain: Secondary | ICD-10-CM | POA: Diagnosis not present

## 2019-01-08 ENCOUNTER — Other Ambulatory Visit: Payer: Self-pay | Admitting: Student in an Organized Health Care Education/Training Program

## 2019-01-08 NOTE — Telephone Encounter (Signed)
I agree, would prefer that patient uses this medicine daily rather than bid. That would be safer.

## 2019-01-08 NOTE — Telephone Encounter (Signed)
Received refill request from pt's pharmacy for lasix 40mg  with BID dosing.  Dose on chart is lasix 40mg  daily.  Will send request to pcp for review.  Please advise.Criss Alvine, Darlene Cassady2/19/20208:45 AM

## 2019-01-09 ENCOUNTER — Telehealth: Payer: Self-pay

## 2019-01-09 NOTE — Telephone Encounter (Signed)
Stated she was returning [place] telephone call. Asked if she's taking Furosemide once a day she stated no "twice a day". Informed her doctor prefers for her to take it once a day and new rx has been sent to the pharmacy; she stated "ok".

## 2019-01-09 NOTE — Telephone Encounter (Signed)
Call made to patient-just wanted to confirm furosemide 40mg  tabs take one daily-no answer, message left on recorder.Mary Spittle Cassady2/20/202010:19 AM

## 2019-01-09 NOTE — Telephone Encounter (Signed)
Requesting to speak with a nurse about meds. Please call pt back.  

## 2019-01-13 ENCOUNTER — Other Ambulatory Visit: Payer: Self-pay | Admitting: Student in an Organized Health Care Education/Training Program

## 2019-01-16 ENCOUNTER — Other Ambulatory Visit: Payer: Self-pay | Admitting: Student in an Organized Health Care Education/Training Program

## 2019-01-23 DIAGNOSIS — F3181 Bipolar II disorder: Secondary | ICD-10-CM | POA: Diagnosis not present

## 2019-01-23 DIAGNOSIS — F1020 Alcohol dependence, uncomplicated: Secondary | ICD-10-CM | POA: Diagnosis not present

## 2019-01-23 DIAGNOSIS — F1210 Cannabis abuse, uncomplicated: Secondary | ICD-10-CM | POA: Diagnosis not present

## 2019-01-23 DIAGNOSIS — F4312 Post-traumatic stress disorder, chronic: Secondary | ICD-10-CM | POA: Diagnosis not present

## 2019-01-23 DIAGNOSIS — F411 Generalized anxiety disorder: Secondary | ICD-10-CM | POA: Diagnosis not present

## 2019-01-23 DIAGNOSIS — F332 Major depressive disorder, recurrent severe without psychotic features: Secondary | ICD-10-CM | POA: Diagnosis not present

## 2019-01-23 DIAGNOSIS — F1110 Opioid abuse, uncomplicated: Secondary | ICD-10-CM | POA: Diagnosis not present

## 2019-02-21 DIAGNOSIS — F4312 Post-traumatic stress disorder, chronic: Secondary | ICD-10-CM | POA: Diagnosis not present

## 2019-02-21 DIAGNOSIS — F411 Generalized anxiety disorder: Secondary | ICD-10-CM | POA: Diagnosis not present

## 2019-02-21 DIAGNOSIS — F332 Major depressive disorder, recurrent severe without psychotic features: Secondary | ICD-10-CM | POA: Diagnosis not present

## 2019-02-21 DIAGNOSIS — F3181 Bipolar II disorder: Secondary | ICD-10-CM | POA: Diagnosis not present

## 2019-05-15 DIAGNOSIS — R32 Unspecified urinary incontinence: Secondary | ICD-10-CM | POA: Diagnosis not present

## 2019-05-15 DIAGNOSIS — I10 Essential (primary) hypertension: Secondary | ICD-10-CM | POA: Diagnosis not present

## 2019-05-15 DIAGNOSIS — K219 Gastro-esophageal reflux disease without esophagitis: Secondary | ICD-10-CM | POA: Diagnosis not present

## 2019-06-16 DIAGNOSIS — I10 Essential (primary) hypertension: Secondary | ICD-10-CM | POA: Diagnosis not present

## 2019-06-16 DIAGNOSIS — K219 Gastro-esophageal reflux disease without esophagitis: Secondary | ICD-10-CM | POA: Diagnosis not present

## 2019-06-16 DIAGNOSIS — R32 Unspecified urinary incontinence: Secondary | ICD-10-CM | POA: Diagnosis not present

## 2019-07-07 ENCOUNTER — Other Ambulatory Visit: Payer: Self-pay | Admitting: Student in an Organized Health Care Education/Training Program

## 2019-07-07 DIAGNOSIS — I10 Essential (primary) hypertension: Secondary | ICD-10-CM

## 2019-07-08 NOTE — Telephone Encounter (Signed)
Needs appointment with me when able.

## 2019-07-08 NOTE — Telephone Encounter (Signed)
Please schedule pt an appt w/Dr Evette Doffing  Thanks

## 2019-07-18 ENCOUNTER — Other Ambulatory Visit: Payer: Self-pay | Admitting: Student in an Organized Health Care Education/Training Program

## 2019-07-18 DIAGNOSIS — I10 Essential (primary) hypertension: Secondary | ICD-10-CM | POA: Diagnosis not present

## 2019-07-18 DIAGNOSIS — K219 Gastro-esophageal reflux disease without esophagitis: Secondary | ICD-10-CM | POA: Diagnosis not present

## 2019-07-18 DIAGNOSIS — R32 Unspecified urinary incontinence: Secondary | ICD-10-CM | POA: Diagnosis not present

## 2019-07-31 ENCOUNTER — Other Ambulatory Visit: Payer: Self-pay | Admitting: Student in an Organized Health Care Education/Training Program

## 2019-08-15 DIAGNOSIS — K219 Gastro-esophageal reflux disease without esophagitis: Secondary | ICD-10-CM | POA: Diagnosis not present

## 2019-08-15 DIAGNOSIS — I10 Essential (primary) hypertension: Secondary | ICD-10-CM | POA: Diagnosis not present

## 2019-08-15 DIAGNOSIS — R32 Unspecified urinary incontinence: Secondary | ICD-10-CM | POA: Diagnosis not present

## 2019-08-18 ENCOUNTER — Ambulatory Visit: Payer: Medicare HMO | Admitting: Student in an Organized Health Care Education/Training Program

## 2019-08-18 ENCOUNTER — Encounter: Payer: Self-pay | Admitting: Student in an Organized Health Care Education/Training Program

## 2019-10-20 ENCOUNTER — Encounter: Payer: Self-pay | Admitting: Student in an Organized Health Care Education/Training Program

## 2019-10-20 ENCOUNTER — Encounter: Payer: Medicare HMO | Admitting: Student in an Organized Health Care Education/Training Program

## 2019-10-23 ENCOUNTER — Other Ambulatory Visit: Payer: Self-pay | Admitting: Student in an Organized Health Care Education/Training Program

## 2019-10-23 DIAGNOSIS — I10 Essential (primary) hypertension: Secondary | ICD-10-CM

## 2019-10-27 ENCOUNTER — Other Ambulatory Visit: Payer: Self-pay | Admitting: *Deleted

## 2019-10-27 MED ORDER — VITAMIN B-12 1000 MCG PO TABS: 1000.0000 ug | ORAL_TABLET | Freq: Every day | ORAL | 3 refills | Status: DC

## 2019-11-04 ENCOUNTER — Other Ambulatory Visit: Payer: Self-pay | Admitting: Student in an Organized Health Care Education/Training Program

## 2019-11-06 ENCOUNTER — Other Ambulatory Visit: Payer: Self-pay | Admitting: Student in an Organized Health Care Education/Training Program

## 2019-12-11 ENCOUNTER — Other Ambulatory Visit: Payer: Self-pay | Admitting: Student in an Organized Health Care Education/Training Program

## 2019-12-11 DIAGNOSIS — I10 Essential (primary) hypertension: Secondary | ICD-10-CM

## 2019-12-15 ENCOUNTER — Other Ambulatory Visit: Payer: Self-pay | Admitting: Student in an Organized Health Care Education/Training Program

## 2019-12-15 DIAGNOSIS — I10 Essential (primary) hypertension: Secondary | ICD-10-CM

## 2019-12-17 ENCOUNTER — Telehealth: Payer: Self-pay | Admitting: Student in an Organized Health Care Education/Training Program

## 2019-12-17 ENCOUNTER — Encounter: Payer: Self-pay | Admitting: Student in an Organized Health Care Education/Training Program

## 2019-12-17 NOTE — Telephone Encounter (Signed)
Attempted to contact patient, but phone does not ring just a fast busy signal.  Tried 3 times and same thing happened.  Sending patient letter asking to please reach out to the clinic to schedule an appt.

## 2019-12-17 NOTE — Telephone Encounter (Signed)
-----   Message from [facility], RD sent at 12/12/2019  4:30 PM EST ----- Regarding: needs appointment/no a1c >1 year No appointment >1 year most of time when no A1C in 1 year. Thank you!  [name]

## 2019-12-20 ENCOUNTER — Other Ambulatory Visit: Payer: Self-pay | Admitting: Student in an Organized Health Care Education/Training Program

## 2019-12-20 DIAGNOSIS — I10 Essential (primary) hypertension: Secondary | ICD-10-CM

## 2019-12-24 ENCOUNTER — Other Ambulatory Visit: Payer: Self-pay | Admitting: Student in an Organized Health Care Education/Training Program

## 2019-12-25 ENCOUNTER — Other Ambulatory Visit: Payer: Self-pay | Admitting: Student in an Organized Health Care Education/Training Program

## 2019-12-25 DIAGNOSIS — I10 Essential (primary) hypertension: Secondary | ICD-10-CM

## 2020-02-10 ENCOUNTER — Ambulatory Visit: Payer: Medicare Other | Admitting: Internal Medicine

## 2020-02-10 ENCOUNTER — Telehealth: Payer: Self-pay | Admitting: Student in an Organized Health Care Education/Training Program

## 2020-02-10 ENCOUNTER — Other Ambulatory Visit: Payer: Self-pay

## 2020-02-10 DIAGNOSIS — E1165 Type 2 diabetes mellitus with hyperglycemia: Secondary | ICD-10-CM

## 2020-02-10 DIAGNOSIS — E034 Atrophy of thyroid (acquired): Secondary | ICD-10-CM

## 2020-02-10 DIAGNOSIS — R110 Nausea: Secondary | ICD-10-CM | POA: Insufficient documentation

## 2020-02-10 DIAGNOSIS — I10 Essential (primary) hypertension: Secondary | ICD-10-CM

## 2020-02-10 MED ORDER — ONDANSETRON HCL 4 MG PO TABS: 4.0000 mg | ORAL_TABLET | Freq: Four times a day (QID) | ORAL | 0 refills | Status: DC

## 2020-02-10 MED ORDER — ENALAPRIL MALEATE 5 MG PO TABS: 5.0000 mg | ORAL_TABLET | Freq: Every day | ORAL | 0 refills | Status: DC

## 2020-02-10 MED ORDER — LEVOTHYROXINE SODIUM 88 MCG PO TABS: ORAL_TABLET | ORAL | 0 refills | Status: DC

## 2020-02-10 MED ORDER — METFORMIN HCL ER 500 MG PO TB24: 1000.0000 mg | ORAL_TABLET | Freq: Two times a day (BID) | ORAL | 0 refills | Status: DC

## 2020-02-10 NOTE — Assessment & Plan Note (Signed)
Refilled levothyroxin for 30 days. Please check TSH at next in person visit. Has not been seen since January of 2020

## 2020-02-10 NOTE — Assessment & Plan Note (Addendum)
Refilled Enalapril for 30 days. Please check BMP and BP at next in person visit. Has not been seen since January of 2020

## 2020-02-10 NOTE — Assessment & Plan Note (Addendum)
Refilled Metformin for 30 days. Please check BMP and A1c at next in person visit. Has not been seen since January of 2020. Patient unable to check CBGs as she can not get her device to function at home.

## 2020-02-10 NOTE — Telephone Encounter (Signed)
rtc pt did not realize she has telehealth appt today. Needs all meds refilled, states she was told she needs appt to have them refilled, she is completely out

## 2020-02-10 NOTE — Assessment & Plan Note (Addendum)
Nausea and lightheadedness: Since 02/08/20 she has felt lightheaded with some nausea. Has been feeling like she has the flu for at least three weeks since running out of all of her medications. Her Anxiety is worse now off of her medications due to .  Patient moved suddenly to her bothers house about a year prior and no longer had her phone, address or a car. As such, she stated that she was not able to receive communication regarding needed appointments. Patient has been off of levothyroxine since January although a script was sent in January. She has been off of the Metformin for approximately a month as well as all of her other medications. She denied fever but has chills off an on for the past three weeks. She denied diarrhea, constipation, abdominal pain, vision changes, polyuria, polydipsia, confusion, diaphoresis, headaches, focal weakness or loss of sensation, spinning sensation, persistent dizziness, vertigo, but endorses a mild tightness in her chest when she becomes anxious at times in the past. I advised her that her symptoms were best evaluated in person but based on what she stated I would like to obtain a BMP, TSH, CBG and A1c as well as a B12 prior to any major medication changes.  She stated that she would schedule in [facility] for a in person visit and follow-up with Dr. Oswaldo Done in April   Plan: 30 day refill supplied for levothyroxine, metformin and enalapril She will need a BMP, TSH, CBG, A1c and B12 (deficient) at her next visit.  Did not refill lasix due to unknown volume status given her uncontrolled DMII and dizziness

## 2020-02-10 NOTE — Progress Notes (Signed)
  [facility] Internal Medicine Residency Telephone Encounter Continuity Care Appointment  HPI:   This telephone encounter was created for [name] on 02/10/2020 for the following purpose/cc feeling unwell.   Past Medical History:  Past Medical History:  Diagnosis Date  . Chronic interstitial cystitis    Followed by Dr. Marcine Matar  . COPD (chronic obstructive pulmonary disease) (HCC)   . DDD (degenerative disc disease), lumbar   . Degeneration of cervical intervertebral disc   . Depression   . Diabetes mellitus, type 2 (HCC) 2007  . Heme positive stool    EGD on 04/08/2010 by Dr. Ewing Schlein showed chronic gastritis and a few gastric polyps; pathology showed a fundic gland polyp.  Colonoscopy on 04/08/2010 showed small external and internal hemorrhoids, and a few benign-appearing diminutive polyps in the rectum, the distal sigmoid colon, and in the distal descending colon; pathology showed hyperplastic polyps.   . Hypertension   . Hypothyroidism   . Iron deficiency anemia   . Spinal stenosis       ROS:   Negative except as per HPI.   Assessment / Plan / Recommendations:   Please see A&P under problem oriented charting for assessment of the patient's acute and chronic medical conditions.   As always, pt is advised that if symptoms worsen or new symptoms arise, they should go to an urgent care facility or to to ER for further evaluation.   Consent and Medical Decision Making:   Patient discussed with Dr. Rogelia Boga  This is a telephone encounter between Mary Gray and Lanelle Bal on 02/10/2020 for feeling unwell. The visit was conducted with the patient located at home and [employer] at [facility]. The patient's identity was confirmed using their DOB and current address. The patient has consented to being evaluated through a telephone encounter and understands the associated risks (an examination cannot be done and the patient may need to come in for an  appointment) / benefits (allows the patient to remain at home, decreasing exposure to coronavirus). I personally spent 14 minutes on medical discussion.

## 2020-02-10 NOTE — Progress Notes (Signed)
Internal Medicine Clinic Attending  Case discussed with Dr. Harbrecht at the time of the visit.  We reviewed the resident's history and exam and pertinent patient test results.  I agree with the assessment, diagnosis, and plan of care documented in the resident's note.   

## 2020-02-10 NOTE — Telephone Encounter (Signed)
Pt feeling lightheaded and dizzy since Sunday. Patient has been without her medication for 1 week.  Patient has updated her address and phone number.  Patient has also made an appointment to see Dr. Oswaldo Done on 03/01/2020.

## 2020-02-10 NOTE — Telephone Encounter (Signed)
Called and discussed care with patient. She stated she would schedule an in person visit ASAP. Filled 30 day supply of levothyroxine, metformin and enalapril to last her until she sees Dr. Oswaldo Done.

## 2020-02-20 ENCOUNTER — Other Ambulatory Visit: Payer: Self-pay

## 2020-02-20 ENCOUNTER — Other Ambulatory Visit: Payer: Self-pay | Admitting: Student in an Organized Health Care Education/Training Program

## 2020-02-20 ENCOUNTER — Other Ambulatory Visit: Payer: Self-pay | Admitting: Internal Medicine

## 2020-02-20 ENCOUNTER — Encounter (HOSPITAL_COMMUNITY): Payer: Self-pay | Admitting: Emergency Medicine

## 2020-02-20 ENCOUNTER — Emergency Department (HOSPITAL_COMMUNITY): Payer: Medicare Other

## 2020-02-20 ENCOUNTER — Emergency Department (HOSPITAL_COMMUNITY)
Admission: EM | Admit: 2020-02-20 | Discharge: 2020-02-20 | Disposition: A | Discharge status: Home / Self Care | Payer: Medicare Other | Attending: Emergency Medicine | Admitting: Emergency Medicine

## 2020-02-20 DIAGNOSIS — Z20822 Contact with and (suspected) exposure to covid-19: Secondary | ICD-10-CM | POA: Diagnosis not present

## 2020-02-20 DIAGNOSIS — I10 Essential (primary) hypertension: Secondary | ICD-10-CM | POA: Insufficient documentation

## 2020-02-20 DIAGNOSIS — Z79899 Other long term (current) drug therapy: Secondary | ICD-10-CM | POA: Insufficient documentation

## 2020-02-20 DIAGNOSIS — R5381 Other malaise: Secondary | ICD-10-CM | POA: Insufficient documentation

## 2020-02-20 DIAGNOSIS — E119 Type 2 diabetes mellitus without complications: Secondary | ICD-10-CM | POA: Insufficient documentation

## 2020-02-20 DIAGNOSIS — Z7984 Long term (current) use of oral hypoglycemic drugs: Secondary | ICD-10-CM | POA: Diagnosis not present

## 2020-02-20 DIAGNOSIS — R531 Weakness: Secondary | ICD-10-CM | POA: Diagnosis present

## 2020-02-20 DIAGNOSIS — E039 Hypothyroidism, unspecified: Secondary | ICD-10-CM | POA: Insufficient documentation

## 2020-02-20 DIAGNOSIS — J449 Chronic obstructive pulmonary disease, unspecified: Secondary | ICD-10-CM | POA: Insufficient documentation

## 2020-02-20 DIAGNOSIS — Z87891 Personal history of nicotine dependence: Secondary | ICD-10-CM | POA: Diagnosis not present

## 2020-02-20 DIAGNOSIS — R110 Nausea: Secondary | ICD-10-CM

## 2020-02-20 LAB — CBC WITH DIFFERENTIAL/PLATELET
Abs Immature Granulocytes: 0.04 10*3/uL (ref 0.00–0.07)
Basophils Absolute: 0.1 10*3/uL (ref 0.0–0.1)
Basophils Relative: 1 %
Eosinophils Absolute: 0.3 10*3/uL (ref 0.0–0.5)
Eosinophils Relative: 3 %
HCT: 38.2 % (ref 36.0–46.0)
Hemoglobin: 13 g/dL (ref 12.0–15.0)
Immature Granulocytes: 0 %
Lymphocytes Relative: 22 %
Lymphs Abs: 2 10*3/uL (ref 0.7–4.0)
MCH: 30.1 pg (ref 26.0–34.0)
MCHC: 34 g/dL (ref 30.0–36.0)
MCV: 88.4 fL (ref 80.0–100.0)
Monocytes Absolute: 0.6 10*3/uL (ref 0.1–1.0)
Monocytes Relative: 6 %
Neutro Abs: 6.3 10*3/uL (ref 1.7–7.7)
Neutrophils Relative %: 68 %
Platelets: 276 10*3/uL (ref 150–400)
RBC: 4.32 MIL/uL (ref 3.87–5.11)
RDW: 12.4 % (ref 11.5–15.5)
WBC: 9.3 10*3/uL (ref 4.0–10.5)
nRBC: 0 % (ref 0.0–0.2)

## 2020-02-20 LAB — URINALYSIS, ROUTINE W REFLEX MICROSCOPIC
Bacteria, UA: NONE SEEN
Bilirubin Urine: NEGATIVE
Glucose, UA: NEGATIVE mg/dL
Ketones, ur: NEGATIVE mg/dL
Nitrite: NEGATIVE
Protein, ur: NEGATIVE mg/dL
Specific Gravity, Urine: 1.012 (ref 1.005–1.030)
pH: 5 (ref 5.0–8.0)

## 2020-02-20 LAB — COMPREHENSIVE METABOLIC PANEL
ALT: 19 U/L (ref 0–44)
AST: 22 U/L (ref 15–41)
Albumin: 4.2 g/dL (ref 3.5–5.0)
Alkaline Phosphatase: 75 U/L (ref 38–126)
Anion gap: 11 (ref 5–15)
BUN: 14 mg/dL (ref 6–20)
CO2: 28 mmol/L (ref 22–32)
Calcium: 9.5 mg/dL (ref 8.9–10.3)
Chloride: 99 mmol/L (ref 98–111)
Creatinine, Ser: 0.77 mg/dL (ref 0.44–1.00)
GFR calc Af Amer: >60 mL/min (ref >60)
GFR calc non Af Amer: >60 mL/min (ref >60)
Glucose, Bld: 130 mg/dL — ABNORMAL HIGH (ref 70–99)
Potassium: 4.1 mmol/L (ref 3.5–5.1)
Sodium: 138 mmol/L (ref 135–145)
Total Bilirubin: 0.7 mg/dL (ref 0.3–1.2)
Total Protein: 7.2 g/dL (ref 6.5–8.1)

## 2020-02-20 LAB — TSH: TSH: 4.058 u[IU]/mL (ref 0.350–4.500)

## 2020-02-20 LAB — LACTIC ACID, PLASMA: Lactic Acid, Venous: 1.6 mmol/L (ref 0.5–1.9)

## 2020-02-20 MED ORDER — HYDROXYZINE HCL 25 MG PO TABS
25.0000 mg | ORAL_TABLET | Freq: Once | ORAL | Status: AC
  Administered 2020-02-20: 25 mg via ORAL
  Filled 2020-02-20: qty 1

## 2020-02-20 MED ORDER — SODIUM CHLORIDE 0.9% FLUSH: 3.0000 mL | Freq: Once | INTRAVENOUS | Status: DC

## 2020-02-20 MED ORDER — ONDANSETRON 4 MG PO TBDP
8.0000 mg | ORAL_TABLET | Freq: Once | ORAL | Status: AC
  Administered 2020-02-20: 8 mg via ORAL
  Filled 2020-02-20: qty 2

## 2020-02-20 NOTE — ED Triage Notes (Signed)
To ED via [facility] -has multiple complaints-- Nausea x 2 weeks, generalized weakness x 1, ear pain x 2 weeks, dark urine 1 week, constipated x 1 week, anxiety attack,

## 2020-02-20 NOTE — ED Notes (Signed)
SW at the bedside.

## 2020-02-20 NOTE — ED Notes (Signed)
Cancel lactic

## 2020-02-20 NOTE — ED Provider Notes (Signed)
[facility] EMERGENCY DEPARTMENT Provider Note   CSN: [id number] Arrival date & time: 02/20/20  1804     History Chief Complaint  Patient presents with  . multiple complaints    Mary Gray is a 59 y.o. female.  Patient to ED with c/o generalized weakness, nausea without vomiting, near syncope, "I'm nervous", congestion, ear pain, sore throat, tactile fever, constipation, bleeding with bowel movement, chest pain, SOB, dysuria. She reports symptoms started in mid-January but have been worse in that last 2 days. She is out of some of her medications: potassium, stomach medicine, Zofran. Patient with a history of T2DM, HTN, HLD, COPD. She reports she is living with her brother after losing her home and has no transportation to get to her doctor or to get her medications.   The history is provided by the patient. No language interpreter was used.       Past Medical History:  Diagnosis Date  . Chronic interstitial cystitis    Followed by Dr. Franchot Gallo  . COPD (chronic obstructive pulmonary disease) ([place])   . DDD (degenerative disc disease), lumbar   . Degeneration of cervical intervertebral disc   . Depression   . Diabetes mellitus, type 2 ([place]) 2007  . Heme positive stool    EGD on 04/08/2010 by Dr. Watt Climes showed chronic gastritis and a few gastric polyps; pathology showed a fundic gland polyp.  Colonoscopy on 04/08/2010 showed small external and internal hemorrhoids, and a few benign-appearing diminutive polyps in the rectum, the distal sigmoid colon, and in the distal descending colon; pathology showed hyperplastic polyps.   . Hypertension   . Hypothyroidism   . Iron deficiency anemia   . Spinal stenosis     Patient Active Problem List   Diagnosis Date Noted  . Nausea 02/10/2020  . B12 deficiency 11/25/2018  . Urinary, incontinence, stress female 09/09/2018  . Osteoarthritis of left knee 05/01/2018  . Chronic venous insufficiency 02/26/2017  .  Candidal skin infection 03/03/2014  . Preventative health care 05/14/2013  . AVN (avascular necrosis of bone) ([place]) 03/07/2013  . Spinal stenosis of lumbar region 03/01/2011  . Hypothyroidism 07/06/2006  . Diabetes mellitus type 2, uncontrolled ([place]) 07/06/2006  . Hyperlipemia 07/06/2006  . Generalized anxiety disorder 07/06/2006  . Essential hypertension 07/06/2006  . GERD 07/06/2006    Past Surgical History:  Procedure Laterality Date  . CARPAL TUNNEL RELEASE  05/08/2000   By Dr. Youlanda Mighty. Sypher, Brooke Bonito.     OB History   No obstetric history on file.     Family History  Problem Relation Age of Onset  . Heart attack Father 76  . Diabetes Mother   . Hypertension Mother   . Diabetes Sister   . Breast cancer Neg Hx   . Colon cancer Neg Hx     Social History   Tobacco Use  . Smoking status: Former Smoker    Packs/day: 0.50    Years: 15.00    Pack years: 7.50    Types: Cigarettes    Quit date: 01/18/1996    Years since quitting: 24.1  . Smokeless tobacco: Never Used  Substance Use Topics  . Alcohol use: No    Alcohol/week: 0.0 standard drinks  . Drug use: No    Home Medications Prior to Admission medications   Medication Sig Start Date End Date Taking? Authorizing Provider  enalapril (VASOTEC) 5 MG tablet Take 1 tablet (5 mg total) by mouth daily. 02/10/20  Yes Kathi Ludwig, MD  esomeprazole (NEXIUM) 40 MG capsule TAKE 1 CAPSULE BY MOUTH EVERY DAY 07/21/19  Yes Axel Filler, MD  levocetirizine (XYZAL) 5 MG tablet TAKE 1 TABLET BY MOUTH EVERY DAY IN THE EVENING 10/01/18  Yes Axel Filler, MD  levothyroxine (SYNTHROID) 88 MCG tablet TAKE 1 TABLET BY MOUTH EVERY DAY BEFORE BREAKFAST 02/10/20  Yes Kathi Ludwig, MD  magnesium oxide (MAG-OX) 400 MG tablet Take 1 tablet by mouth daily. 10/26/19  Yes [provider]  metFORMIN (GLUCOPHAGE-XR) 500 MG 24 hr tablet Take 2 tablets (1,000 mg total) by mouth 2 (two) times daily. 02/10/20  Yes  Kathi Ludwig, MD  ondansetron (ZOFRAN) 4 MG tablet Take 1 tablet (4 mg total) by mouth every 6 (six) hours. 02/10/20  Yes Kathi Ludwig, MD  potassium chloride (KLOR-CON M10) 10 MEQ tablet Take 2 tablets (20 mEq total) by mouth daily. 09/09/18  Yes Axel Filler, MD  pravastatin (PRAVACHOL) 10 MG tablet TAKE 1 TABLET BY MOUTH EVERY DAY IN THE EVENING 01/16/19  Yes Axel Filler, MD  vitamin B-12 (CYANOCOBALAMIN) 1000 MCG tablet Take 1 tablet (1,000 mcg total) by mouth daily. 10/27/19  Yes Axel Filler, MD  ACCU-CHEK FASTCLIX LANCETS MISC Check blood sugar daily 03/18/18   Axel Filler, MD  albuterol [facility] HFA) 108 (90 BASE) MCG/ACT inhaler Inhale 2 puffs into the lungs every 6 (six) hours as needed for wheezing or shortness of breath. Patient not taking: Reported on 02/20/2020 05/25/14   Bertha Stakes, MD  ALPRAZolam Duanne Moron) 1 MG tablet Take 1 tablet (1 mg total) by mouth 4 (four) times daily as needed for anxiety. Patient not taking: Reported on 02/20/2020 03/18/18   Axel Filler, MD  amitriptyline (ELAVIL) 25 MG tablet Take 2 tablets (50 mg total) by mouth at bedtime. Patient not taking: Reported on 02/20/2020 06/10/18   Axel Filler, MD  Blood Glucose Monitoring Suppl (ACCU-CHEK GUIDE) w/Device KIT 1 each by Does not apply route daily. 03/18/18   Axel Filler, MD  diclofenac sodium (VOLTAREN) 1 % GEL Apply 2 g topically 4 (four) times daily. Patient not taking: Reported on 02/20/2020 05/01/18   Melanee Spry, MD  fluticasone (FLONASE) 50 MCG/ACT nasal spray Place 2 sprays into both nostrils daily. Patient not taking: Reported on 02/20/2020 06/22/18   Ok Edwards, PA-C  furosemide (LASIX) 40 MG tablet Take 1 tablet (40 mg total) by mouth daily. Patient not taking: Reported on 02/20/2020 01/08/19   Axel Filler, MD  glucose blood [facility] ACTIVE STRIPS) test strip Check your blood sugar once daily 11/25/18   Axel Filler, MD  glucose blood (ACCU-CHEK GUIDE) test strip USE TO CHECK BLOOD SUGAR 1 TIME DAILY. E11.65. NON- INSULIN DEPENDENT 10/29/18   Axel Filler, MD  HYDROcodone-acetaminophen [facility]) 7.5-325 MG tablet Take 1 tablet by mouth every 6 (six) hours as needed for moderate pain. Patient not taking: Reported on 02/20/2020 09/09/18   Axel Filler, MD  ipratropium (ATROVENT) 0.06 % nasal spray PLACE 2 SPRAYS INTO BOTH NOSTRILS 4 (FOUR) TIMES DAILY. Patient not taking: Reported on 02/20/2020 07/18/18   Axel Filler, MD  lactulose (KRISTALOSE) 20 g packet Take 1 packet (20 g total) by mouth daily as needed (constipation). Patient not taking: Reported on 02/20/2020 01/22/17   Axel Filler, MD  LYRICA 75 MG capsule Take 1 capsule (75 mg total) by mouth 2 (two) times daily. Patient not taking: Reported on 02/20/2020 06/10/18   Axel Filler,  MD  nystatin (MYCOSTATIN/NYSTOP) powder APPLY TO AFFECTED AREA 4 TIMES A DAY Patient not taking: Reported on 02/20/2020 07/31/19   Axel Filler, MD  SYMBICORT 160-4.5 MCG/ACT inhaler TAKE 2 PUFFS BY MOUTH TWICE A DAY Patient not taking: Reported on 02/20/2020 10/31/17   Axel Filler, MD    Allergies    Codeine sulfate, Meperidine and related, Minocycline hcl, Penicillins, Propoxyphene n-acetaminophen, and Tetracycline hcl  Review of Systems   Review of Systems  Constitutional: Positive for fever (tactile).  HENT: Positive for congestion, ear pain and sore throat.   Respiratory: Negative.  Negative for cough and shortness of breath.   Cardiovascular: Negative.  Negative for chest pain.  Gastrointestinal: Positive for abdominal pain, blood in stool, constipation and nausea. Negative for vomiting.  Genitourinary: Positive for dysuria.  Musculoskeletal: Positive for myalgias.  Skin: Negative.   Neurological: Positive for dizziness and weakness. Negative for syncope.    Physical Exam Updated Vital Signs BP  (!) 148/83 (BP Location: Left Arm)   Pulse 86   Temp 98.7 F (37.1 C) (Oral)   Resp 18   Ht 5' 1.5" (1.562 m)   Wt 85.7 kg   LMP 01/17/2009   SpO2 97%   BMI 35.13 kg/m   Physical Exam Vitals and nursing note reviewed.  Constitutional:      Appearance: She is well-developed. She is obese.  HENT:     Head: Normocephalic.     Right Ear: Tympanic membrane normal.     Left Ear: Tympanic membrane normal.     Mouth/Throat:     Mouth: Mucous membranes are moist.     Pharynx: Oropharynx is clear.  Cardiovascular:     Rate and Rhythm: Normal rate and regular rhythm.  Pulmonary:     Effort: Pulmonary effort is normal.     Breath sounds: Normal breath sounds. No wheezing, rhonchi or rales.  Abdominal:     General: Bowel sounds are normal.     Palpations: Abdomen is soft.     Tenderness: There is no abdominal tenderness. There is no guarding or rebound.  Musculoskeletal:        General: Normal range of motion.     Cervical back: Normal range of motion and neck supple.  Skin:    General: Skin is warm and dry.     Findings: No rash.  Neurological:     Mental Status: She is alert and oriented to person, place, and time.     Sensory: No sensory deficit.     Motor: No weakness.     Coordination: Coordination normal.     Gait: Gait normal.     ED Results / Procedures / Treatments   Labs (all labs ordered are listed, but only abnormal results are displayed) Labs Reviewed  LACTIC ACID, PLASMA  LACTIC ACID, PLASMA  COMPREHENSIVE METABOLIC PANEL  CBC WITH DIFFERENTIAL/PLATELET  URINALYSIS, ROUTINE W REFLEX MICROSCOPIC  TSH   Results for orders placed or performed during the hospital encounter of 02/20/20  Lactic acid, plasma  Result Value Ref Range   Lactic Acid, Venous 1.6 0.5 - 1.9 mmol/L  Comprehensive metabolic panel  Result Value Ref Range   Sodium 138 135 - 145 mmol/L   Potassium 4.1 3.5 - 5.1 mmol/L   Chloride 99 98 - 111 mmol/L   CO2 28 22 - 32 mmol/L   Glucose,  Bld 130 (H) 70 - 99 mg/dL   BUN 14 6 - 20 mg/dL   Creatinine, Ser 0.77 0.44 -  1.00 mg/dL   Calcium 9.5 8.9 - 10.3 mg/dL   Total Protein 7.2 6.5 - 8.1 g/dL   Albumin 4.2 3.5 - 5.0 g/dL   AST 22 15 - 41 U/L   ALT 19 0 - 44 U/L   Alkaline Phosphatase 75 38 - 126 U/L   Total Bilirubin 0.7 0.3 - 1.2 mg/dL   GFR calc non Af Amer >60 >60 mL/min   GFR calc Af Amer >60 >60 mL/min   Anion gap 11 5 - 15  CBC with Differential  Result Value Ref Range   WBC 9.3 4.0 - 10.5 K/uL   RBC 4.32 3.87 - 5.11 MIL/uL   Hemoglobin 13.0 12.0 - 15.0 g/dL   HCT 38.2 36.0 - 46.0 %   MCV 88.4 80.0 - 100.0 fL   MCH 30.1 26.0 - 34.0 pg   MCHC 34.0 30.0 - 36.0 g/dL   RDW 12.4 11.5 - 15.5 %   Platelets 276 150 - 400 K/uL   nRBC 0.0 0.0 - 0.2 %   Neutrophils Relative % 68 %   Neutro Abs 6.3 1.7 - 7.7 K/uL   Lymphocytes Relative 22 %   Lymphs Abs 2.0 0.7 - 4.0 K/uL   Monocytes Relative 6 %   Monocytes Absolute 0.6 0.1 - 1.0 K/uL   Eosinophils Relative 3 %   Eosinophils Absolute 0.3 0.0 - 0.5 K/uL   Basophils Relative 1 %   Basophils Absolute 0.1 0.0 - 0.1 K/uL   Immature Granulocytes 0 %   Abs Immature Granulocytes 0.04 0.00 - 0.07 K/uL  Urinalysis, Routine w reflex microscopic  Result Value Ref Range   Color, Urine YELLOW YELLOW   APPearance CLEAR CLEAR   Specific Gravity, Urine 1.012 1.005 - 1.030   pH 5.0 5.0 - 8.0   Glucose, UA NEGATIVE NEGATIVE mg/dL   Hgb urine dipstick SMALL (A) NEGATIVE   Bilirubin Urine NEGATIVE NEGATIVE   Ketones, ur NEGATIVE NEGATIVE mg/dL   Protein, ur NEGATIVE NEGATIVE mg/dL   Nitrite NEGATIVE NEGATIVE   Leukocytes,Ua MODERATE (A) NEGATIVE   RBC / HPF 0-5 0 - 5 RBC/hpf   WBC, UA 0-5 0 - 5 WBC/hpf   Bacteria, UA NONE SEEN NONE SEEN   Squamous Epithelial / LPF 0-5 0 - 5   Mucus PRESENT   TSH  Result Value Ref Range   TSH 4.058 0.350 - 4.500 uIU/mL    EKG None  Radiology DG Chest 2 View  Result Date: 02/20/2020 CLINICAL DATA:  Chest pain EXAM: CHEST - 2 VIEW  COMPARISON:  08/24/2016 FINDINGS: The heart size and mediastinal contours are within normal limits. Both lungs are clear. Disc degenerative disease of the thoracic spine. IMPRESSION: No acute abnormality of the lungs. Electronically Signed   By: Eddie Candle M.D.   On: 02/20/2020 18:36    Procedures Procedures (including critical care time)  Medications Ordered in ED Medications  sodium chloride flush (NS) 0.9 % injection 3 mL (has no administration in time range)    ED Course  I have reviewed the triage vital signs and the nursing notes.  Pertinent labs & imaging results that were available during my care of the patient were reviewed by me and considered in my medical decision making (see chart for details).    MDM Rules/Calculators/A&P                      Patient to ED with multiple complaints x 3 months, worse in the last 3  days.   The patient has an almost completely positive ROS. She appears nontoxic, VSS, afebrile. Labs are normal. CXR clear. She is eating and drinking. She is given vistaril and Zofran in the department for symptoms of nervousness and nausea.   She voices multiple times that she is living with her brother who does not want her there but she has no where to go. CSW asked to see her to provide possible resources. CSW informed patient of [facility] transportation for physician visits and the patient's PCP is the outpatient clinic. An appointment has been set up for Monday. The patient is provided that information by Lucia Bitter.   She can be discharged home. COVID for send-out collected at discharge.  Final Clinical Impression(s) / ED Diagnoses Final diagnoses:  None   1. General malaise   Rx / DC Orders ED Discharge Orders    None       Dennie Bible 02/20/20 2036    Davonna Belling, MD 02/20/20 2240

## 2020-02-20 NOTE — ED Notes (Signed)
Pt in xray

## 2020-02-20 NOTE — Discharge Instructions (Signed)
Keep your scheduled appointment on Monday for recheck of current symptoms and medication refill.

## 2020-02-20 NOTE — Care Management (Incomplete)
ED CM sent message to Internal Medicine -Clinic to follow up with patient's needs ?

## 2020-02-21 LAB — SARS CORONAVIRUS 2 (TAT 6-24 HRS): SARS Coronavirus 2: NEGATIVE

## 2020-02-24 ENCOUNTER — Telehealth: Payer: Self-pay | Admitting: *Deleted

## 2020-02-25 NOTE — Telephone Encounter (Signed)
Information was sent to [facility] for PA for Ondansetron 4 mg tablets.  Awaiting determination.  Angelina Ok, RN 02/24/2020.  9:45 AM   Denial received for PA for Ondansetron 4 mg tablets .  Message to be sent to Dr.

## 2020-02-25 NOTE — Telephone Encounter (Signed)
Denial received for Ondansetron 4mg  tablets from [facility]. Patients diagnosis was not a covered reason for approval..  Call to patient's pharmacy cost to patient will be 46.79.  Patient has not of yet tried to pick up medication.  Unable to reach patient by phone to see if medication for nausea is still needed.  Message to be forwarded to Dr. .  Oswaldo Done, RN 02/25/2020 9:06 AM.

## 2020-03-01 ENCOUNTER — Telehealth: Payer: Self-pay

## 2020-03-01 ENCOUNTER — Encounter: Payer: Medicare HMO | Admitting: Student in an Organized Health Care Education/Training Program

## 2020-03-02 ENCOUNTER — Encounter: Payer: Self-pay | Admitting: Student in an Organized Health Care Education/Training Program

## 2020-05-18 ENCOUNTER — Ambulatory Visit (INDEPENDENT_AMBULATORY_CARE_PROVIDER_SITE_OTHER): Payer: Medicare Other | Admitting: Internal Medicine

## 2020-05-18 ENCOUNTER — Ambulatory Visit (INDEPENDENT_AMBULATORY_CARE_PROVIDER_SITE_OTHER): Payer: Medicare Other | Admitting: Primary Care

## 2020-05-18 ENCOUNTER — Encounter: Payer: Self-pay | Admitting: Internal Medicine

## 2020-05-18 VITALS — BP 111/63 | HR 95 | Temp 98.7°F | Ht 61.0 in | Wt 214.7 lb

## 2020-05-18 DIAGNOSIS — E039 Hypothyroidism, unspecified: Secondary | ICD-10-CM

## 2020-05-18 DIAGNOSIS — M545 Low back pain: Secondary | ICD-10-CM | POA: Diagnosis not present

## 2020-05-18 DIAGNOSIS — M62830 Muscle spasm of back: Secondary | ICD-10-CM

## 2020-05-18 DIAGNOSIS — M5441 Lumbago with sciatica, right side: Secondary | ICD-10-CM

## 2020-05-18 DIAGNOSIS — M48061 Spinal stenosis, lumbar region without neurogenic claudication: Secondary | ICD-10-CM

## 2020-05-18 DIAGNOSIS — E034 Atrophy of thyroid (acquired): Secondary | ICD-10-CM

## 2020-05-18 DIAGNOSIS — K219 Gastro-esophageal reflux disease without esophagitis: Secondary | ICD-10-CM

## 2020-05-18 DIAGNOSIS — I10 Essential (primary) hypertension: Secondary | ICD-10-CM

## 2020-05-18 MED ORDER — LIDOCAINE 5 % EX PTCH: 1.0000 | MEDICATED_PATCH | Freq: Two times a day (BID) | CUTANEOUS | 0 refills | Status: DC

## 2020-05-18 MED ORDER — LEVOTHYROXINE SODIUM 88 MCG PO TABS: ORAL_TABLET | ORAL | 0 refills | Status: DC

## 2020-05-18 MED ORDER — ESOMEPRAZOLE MAGNESIUM 40 MG PO CPDR: DELAYED_RELEASE_CAPSULE | ORAL | 0 refills | Status: DC

## 2020-05-18 MED ORDER — CYCLOBENZAPRINE HCL 10 MG PO TABS: 10.0000 mg | ORAL_TABLET | Freq: Three times a day (TID) | ORAL | 0 refills | Status: DC | PRN

## 2020-05-18 MED ORDER — IBUPROFEN 800 MG PO TABS: 800.0000 mg | ORAL_TABLET | Freq: Three times a day (TID) | ORAL | 0 refills | Status: DC | PRN

## 2020-05-18 NOTE — Progress Notes (Signed)
   CC: Back pain  HPI:  Mary Gray is a 59 y.o. female with PMHx as documented below, presented with back pain. Please refer to problem based charting for further details and assessment and plan of current problem and chronic medical conditions.  PMHx:  Spinal stenosis of lumbar region, HTN, DM, hypothyroidism, osteoarthritis, HLD, GAD, avascular necrosis of bone  Past Medical History:  Diagnosis Date  . Chronic interstitial cystitis    Followed by Dr. Marcine Matar  . COPD (chronic obstructive pulmonary disease) ([facility])   . DDD (degenerative disc disease), lumbar   . Degeneration of cervical intervertebral disc   . Depression   . Diabetes mellitus, type 2 ([facility]) 2007  . Heme positive stool    EGD on 04/08/2010 by Dr. Ewing Schlein showed chronic gastritis and a few gastric polyps; pathology showed a fundic gland polyp.  Colonoscopy on 04/08/2010 showed small external and internal hemorrhoids, and a few benign-appearing diminutive polyps in the rectum, the distal sigmoid colon, and in the distal descending colon; pathology showed hyperplastic polyps.   . Hypertension   . Hypothyroidism   . Iron deficiency anemia   . Spinal stenosis    Review of Systems:   Review of Systems  Constitutional: Negative for chills and fever.  Cardiovascular: Negative for chest pain and leg swelling.  Musculoskeletal: Positive for back pain.  Neurological: Negative for sensory change, focal weakness and weakness.   Physical Exam:  Vitals:   05/18/20 0940  BP: 111/63  Pulse: 95  Temp: 98.7 F (37.1 C)  TempSrc: Oral  SpO2: 96%  Weight: 214 lb 11.2 oz (97.4 kg)  Height: 5\' 1"  (1.549 m)   Physical Exam Constitutional:      General: She is not in acute distress.    Appearance: She is obese. She is not ill-appearing.  Cardiovascular:     Rate and Rhythm: Normal rate and regular rhythm.     Pulses: Normal pulses.     Heart sounds: Normal heart sounds. No murmur heard.   Pulmonary:      Effort: Pulmonary effort is normal. No respiratory distress.     Breath sounds: Normal breath sounds. No wheezing or rales.  Abdominal:     General: There is no distension.     Palpations: Abdomen is soft.     Tenderness: There is no abdominal tenderness.  Musculoskeletal:     Right lower leg: No edema.     Left lower leg: No edema.  Neurological:     General: No focal deficit present.     Mental Status: She is oriented to person, place, and time.     Sensory: No sensory deficit.     Motor: No weakness.     Comments: Mildly positive SLR  Psychiatric:        Mood and Affect: Mood normal.        Behavior: Behavior normal.    Assessment & Plan:   See Encounters Tab for problem based charting.  Patient discussed with Dr. 

## 2020-05-18 NOTE — Patient Instructions (Addendum)
  Thank you for allowing [place] to provide your care today.  I prescribe Muscle relaxant and Ibuprofen and lidocaine patch for back pain and refer you for physical therapy.  I sent refill for some of your medications. Please pick them up and take them as instructed. Please get an appointment to see your primary care doctor. Your symptoms may take 6 to 8 weeks to get better. Please come back to clinic in 4 weeks or sooner if your symptoms did not get better. As always, if having severe symptoms, please seek medical attention at emergency room. Should you have any questions or concerns please call the internal medicine clinic at [phone].    Thank you!

## 2020-05-19 ENCOUNTER — Encounter: Payer: Self-pay | Admitting: Internal Medicine

## 2020-05-19 NOTE — Assessment & Plan Note (Signed)
Sent refill for Synthroid

## 2020-05-19 NOTE — Assessment & Plan Note (Addendum)
Patient with history of spinal stenosis of lumbar region, on chronic pain medication and followed at [facility] in [place] before. She mentions that since 1 to 2 weeks ago and after some heavy lifting at home, she gradually felt worsening of her back pain.  It is on bilateral lower back, sometimes radiate to her right leg.  Denies any numbness, tingling, weakness.  No saddle anesthesia.  No urinary or fecal incontinency. She took some ibuprofen and few leftover Flexeril that belonged to her friend with some relief but the pain is continuous and interfere with activity now. On exam, she has tenderness in the entire lower back.  SLR mildly positive at right side.  Sensation and motor strength are intact. Her symptoms, are suggestive of muscle spasm and acute on chronic back pain in setting of lumbar stenosis that she has been dealing with chronically. She has some symptoms of sciatica and mildly positive SLR but no red flag to repeat imaging. Will manage conservatively and inform patient that it may take 6-8 weeks for spasm to be resolved. -Flexeril 10 mg as needed 3 times daily for 5 days -Ibuprofen 800 mg as needed 3 times daily.  Advised patient to take it with food and only PRN for 1 week and take PPI daily with it -Lidocaine patch PRN 1-2 times a day -No red flag to repeat imaging at this point.  Will consider that if no improvement within 6 to 8 weeks or if any red flag -Ambulatory referral to PT

## 2020-05-21 NOTE — Progress Notes (Signed)
Internal Medicine Clinic Attending  Case discussed with Dr. Masoudi  at the time of the visit.  We reviewed the resident's history and exam and pertinent patient test results.  I agree with the assessment, diagnosis, and plan of care documented in the resident's note.  

## 2020-05-25 ENCOUNTER — Other Ambulatory Visit: Payer: Self-pay | Admitting: *Deleted

## 2020-05-25 ENCOUNTER — Other Ambulatory Visit: Payer: Self-pay | Admitting: Student in an Organized Health Care Education/Training Program

## 2020-05-25 ENCOUNTER — Telehealth: Payer: Self-pay | Admitting: *Deleted

## 2020-05-25 DIAGNOSIS — M48061 Spinal stenosis, lumbar region without neurogenic claudication: Secondary | ICD-10-CM

## 2020-05-25 DIAGNOSIS — M62830 Muscle spasm of back: Secondary | ICD-10-CM

## 2020-05-25 MED ORDER — CYCLOBENZAPRINE HCL 10 MG PO TABS: 10.0000 mg | ORAL_TABLET | Freq: Three times a day (TID) | ORAL | 0 refills | Status: DC | PRN

## 2020-05-25 MED ORDER — IBUPROFEN 800 MG PO TABS: 800.0000 mg | ORAL_TABLET | Freq: Three times a day (TID) | ORAL | 0 refills | Status: DC | PRN

## 2020-05-25 NOTE — Telephone Encounter (Signed)
Attempted calling pt and she has no vmail that has been setup. Will await her call back

## 2020-05-25 NOTE — Telephone Encounter (Signed)
Hydrocodone not prescribed by me in about a year. Patient also prescribed alprazolam, so restarting an opioid for chronic pain poses more risk which will need to be addressed at our next visit.

## 2020-05-25 NOTE — Telephone Encounter (Signed)
NEED REFILL ON HYDROcodone-acetaminophen (NORCO) 7.5-325 MG tablet cyclobenzaprine (FLEXERIL) 10 MG tablet ;PT CONTACT [phone]   [id number] [place], [place] - [address]

## 2020-05-26 ENCOUNTER — Encounter: Payer: Self-pay | Admitting: *Deleted

## 2020-05-31 ENCOUNTER — Telehealth: Payer: Self-pay | Admitting: *Deleted

## 2020-05-31 NOTE — Telephone Encounter (Signed)
Information was sent through [employer]Meds for PA for Lidoderm 5% Patches.  Awaiting determination.  Approved 05/31/2020 through 11/19/2020.  Angelina Ok, RN 05/31/2020 12:12 PM.

## 2020-06-01 ENCOUNTER — Other Ambulatory Visit: Payer: Self-pay | Admitting: *Deleted

## 2020-06-01 ENCOUNTER — Telehealth: Payer: Self-pay | Admitting: Student in an Organized Health Care Education/Training Program

## 2020-06-01 DIAGNOSIS — M62830 Muscle spasm of back: Secondary | ICD-10-CM

## 2020-06-01 NOTE — Telephone Encounter (Signed)
Pt contact pt she is calling regarding pain medicine [phone]

## 2020-06-01 NOTE — Telephone Encounter (Signed)
Called pharm, pharmacist ran patches thru and they are approved, she will call pt when they are ready. Called pt and informed her, she states she needs pain medicine, after discussion, she ask for refill of flexeril, request sent to dr Oswaldo Done.

## 2020-06-01 NOTE — Telephone Encounter (Signed)
Pt states she has called Yesterday and this morning and has not rec'd a callback.  Patient states she is in pain and needs a nurse to call back .

## 2020-06-02 MED ORDER — CYCLOBENZAPRINE HCL 10 MG PO TABS: 10.0000 mg | ORAL_TABLET | Freq: Three times a day (TID) | ORAL | 0 refills | Status: DC | PRN

## 2020-06-07 ENCOUNTER — Other Ambulatory Visit: Payer: Self-pay

## 2020-06-07 DIAGNOSIS — M62830 Muscle spasm of back: Secondary | ICD-10-CM

## 2020-06-07 DIAGNOSIS — M48061 Spinal stenosis, lumbar region without neurogenic claudication: Secondary | ICD-10-CM

## 2020-06-07 MED ORDER — IBUPROFEN 800 MG PO TABS: 800.0000 mg | ORAL_TABLET | Freq: Three times a day (TID) | ORAL | 0 refills | Status: DC | PRN

## 2020-06-07 MED ORDER — LIDOCAINE 5 % EX PTCH: 1.0000 | MEDICATED_PATCH | Freq: Two times a day (BID) | CUTANEOUS | 0 refills | Status: DC

## 2020-06-07 MED ORDER — FUROSEMIDE 40 MG PO TABS: 40.0000 mg | ORAL_TABLET | Freq: Every day | ORAL | 0 refills | Status: DC

## 2020-06-07 MED ORDER — CYCLOBENZAPRINE HCL 10 MG PO TABS: 10.0000 mg | ORAL_TABLET | Freq: Three times a day (TID) | ORAL | 0 refills | Status: DC | PRN

## 2020-06-07 NOTE — Telephone Encounter (Signed)
Patient called back stating she has "fluid in legs." Requesting refill on lasix. Has appt on 06/14/2020 with PCP. Will forward to Attending Pool as PCP is unavailable at this time. Kinnie Feil, BSN, RN-BC

## 2020-06-07 NOTE — Telephone Encounter (Signed)
Patient notified of refills and asked about refill for Ibuprofen 800 mg. States her legs are in "severe pain and using friend's walker just to get around." Will route to Dr. Mikey Bussing for consideration. Kinnie Feil, BSN, RN-BC

## 2020-06-07 NOTE — Telephone Encounter (Signed)
Requesting ibuprofen 800mg ,  lidocaine (LIDODERM) 5 , and cyclobenzaprine (FLEXERIL) 10 MG tablet @  [id number] , [place] - [address] Phone:  [phone]  Fax:  [phone]

## 2020-06-07 NOTE — Addendum Note (Signed)
Addended by: Fredderick Severance on: 06/07/2020 04:35 PM   Modules accepted: Orders

## 2020-06-09 ENCOUNTER — Other Ambulatory Visit: Payer: Self-pay | Admitting: Internal Medicine

## 2020-06-09 DIAGNOSIS — E034 Atrophy of thyroid (acquired): Secondary | ICD-10-CM

## 2020-06-09 DIAGNOSIS — K219 Gastro-esophageal reflux disease without esophagitis: Secondary | ICD-10-CM

## 2020-06-14 ENCOUNTER — Ambulatory Visit: Payer: Medicare Other | Admitting: Student in an Organized Health Care Education/Training Program

## 2020-06-14 ENCOUNTER — Encounter: Payer: Self-pay | Admitting: Student in an Organized Health Care Education/Training Program

## 2020-06-14 VITALS — BP 162/68 | HR 96 | Temp 97.8°F | Ht 61.0 in | Wt 204.9 lb

## 2020-06-14 DIAGNOSIS — I10 Essential (primary) hypertension: Secondary | ICD-10-CM

## 2020-06-14 DIAGNOSIS — M48061 Spinal stenosis, lumbar region without neurogenic claudication: Secondary | ICD-10-CM

## 2020-06-14 DIAGNOSIS — E1165 Type 2 diabetes mellitus with hyperglycemia: Secondary | ICD-10-CM

## 2020-06-14 LAB — GLUCOSE, CAPILLARY: Glucose-Capillary: 234 mg/dL — ABNORMAL HIGH (ref 70–99)

## 2020-06-14 LAB — POCT GLYCOSYLATED HEMOGLOBIN (HGB A1C): Hemoglobin A1C: 7.4 % — AB (ref 4.0–5.6)

## 2020-06-14 MED ORDER — METFORMIN HCL ER 500 MG PO TB24: 1000.0000 mg | ORAL_TABLET | Freq: Two times a day (BID) | ORAL | 5 refills | Status: DC

## 2020-06-14 MED ORDER — ENALAPRIL MALEATE 10 MG PO TABS: 10.0000 mg | ORAL_TABLET | Freq: Every day | ORAL | 3 refills | Status: DC

## 2020-06-14 NOTE — Assessment & Plan Note (Signed)
Worsening low back pain with radiating pain to her right thigh over the last few months.  Causing some falls at home, function limiting, multifactorial.  Polypharmacy is a big issue here, I work with the patient in the past using hydrocodone but then in 2019 we had to discontinue because of some evidence of diversion on urine toxicology.  She has tried her friend's gabapentin without much benefit, and reports that typical supportive care with ibuprofen and topical lidocaine is not beneficial.  I tried to readjust expectations, I do not think there is a simple explanation or solution here.  Plan to follow-up with me in 2 weeks, I want her to bring all her medications from home so that we can come up with a consistent plan for medical management that hopefully will not increase risk from polypharmacy.

## 2020-06-14 NOTE — Progress Notes (Signed)
   Assessment and Plan:  See Encounters tab for problem-based medical decision making.   __________________________________________________________  HPI:   59 year old person here for follow-up of hypertension and diabetes.  Is been over a year since I have seen this patient's, she moved down to [place] and had great difficulty obtaining transportation to get back to the clinic.  This was so bad at a point that she was unable to access pharmacies and so has been off many of her medications for some time.  It sounds like this was not a good living situation, and she recently moved back to [place] and is renting a room at a friend's apartment right now.  That friend is also helping her with transportation, though this still is an issue for her.  She did not bring her medications today, not sure what she is taking, there have been significant interruptions to her daily medication regimen.  No recent hospitalizations, no recent illnesses, has not had Covid vaccination.  She reports having worsening low back pain with radiating pain that feels like a stabbing sensation in her right anterior thigh.  She reports weakness in her legs, poor balance, a fall last night and hit her head.  Using a walker that was given to her by her friend to get around her house right now.  Not much benefit from high-dose ibuprofen or lidocaine patches.  No fevers or chills.  __________________________________________________________  Problem List: Patient Active Problem List   Diagnosis Date Noted  . Spinal stenosis of lumbar region 03/01/2011    Priority: High  . Diabetes mellitus type 2, uncontrolled (HCC) 07/06/2006    Priority: High  . Generalized anxiety disorder 07/06/2006    Priority: High  . B12 deficiency 11/25/2018    Priority: Medium  . AVN (avascular necrosis of bone) (HCC) 03/07/2013    Priority: Medium  . Hypothyroidism 07/06/2006    Priority: Medium  . Essential hypertension 07/06/2006     Priority: Medium  . Urinary, incontinence, stress female 09/09/2018    Priority: Low  . Osteoarthritis of left knee 05/01/2018    Priority: Low  . Chronic venous insufficiency 02/26/2017    Priority: Low  . Candidal skin infection 03/03/2014    Priority: Low  . Preventative health care 05/14/2013    Priority: Low  . Hyperlipemia 07/06/2006    Priority: Low  . GERD 07/06/2006    Priority: Low    Medications: Reconciled today in Epic __________________________________________________________  Physical Exam:  Vital Signs: Vitals:   06/14/20 0850  BP: (!) 162/68  Pulse: 96  Temp: 97.8 F (36.6 C)  TempSrc: Oral  SpO2: 98%  Weight: (!) 204 lb 14.4 oz (92.9 kg)  Height: 5\' 1"  (1.549 m)    Gen: Well appearing, NAD CV: RRR, no murmurs Pulm: Normal effort, CTA throughout, no wheezing Ext: Warm, no edema

## 2020-06-14 NOTE — Patient Instructions (Addendum)
It was nice seeing you, at least briefly, for today's visit.   We will check you A1c today. It is hard for me to make changes to your medicines without knowing exactly what you are taking at home. Please bring all your medication bottles to our next visit in a few weeks. From there we can make a long term plan to help your back pain and improve your functioning.

## 2020-06-14 NOTE — Assessment & Plan Note (Signed)
Blood pressure elevated today.  Plan is to increase enalapril from 5 mg to 10 mg daily.  Follow-up in 2 weeks.  There is risk of nonadherence due to poor access to pharmacies.

## 2020-06-14 NOTE — Assessment & Plan Note (Signed)
Well-controlled diabetes, hemoglobin A1c 7.4% today.  Plan to continue with Metformin 1000 mg twice daily.

## 2020-06-15 ENCOUNTER — Other Ambulatory Visit: Payer: Self-pay | Admitting: *Deleted

## 2020-06-15 ENCOUNTER — Other Ambulatory Visit: Payer: Self-pay

## 2020-06-15 DIAGNOSIS — M62830 Muscle spasm of back: Secondary | ICD-10-CM

## 2020-06-15 DIAGNOSIS — M48061 Spinal stenosis, lumbar region without neurogenic claudication: Secondary | ICD-10-CM

## 2020-06-15 MED ORDER — IBUPROFEN 800 MG PO TABS: 800.0000 mg | ORAL_TABLET | Freq: Three times a day (TID) | ORAL | 0 refills | Status: DC | PRN

## 2020-06-15 MED ORDER — CYCLOBENZAPRINE HCL 10 MG PO TABS: 10.0000 mg | ORAL_TABLET | Freq: Three times a day (TID) | ORAL | 0 refills | Status: DC | PRN

## 2020-06-15 NOTE — Telephone Encounter (Signed)
ibuprofen (ADVIL) 800 MG tablet  cyclobenzaprine (FLEXERIL) 10 MG tablet, REFILL REQUEST @  [id number] [place], [place] - [address] Phone:  [phone]  Fax:  [phone]

## 2020-06-16 ENCOUNTER — Ambulatory Visit (INDEPENDENT_AMBULATORY_CARE_PROVIDER_SITE_OTHER): Payer: Medicare Other | Admitting: Internal Medicine

## 2020-06-16 ENCOUNTER — Other Ambulatory Visit: Payer: Self-pay

## 2020-06-16 ENCOUNTER — Ambulatory Visit (HOSPITAL_COMMUNITY)
Admission: RE | Admit: 2020-06-16 | Discharge: 2020-06-16 | Disposition: A | Discharge status: Home / Self Care | Payer: Medicare Other | Source: Ambulatory Visit | Attending: Internal Medicine | Admitting: Internal Medicine

## 2020-06-16 ENCOUNTER — Encounter: Payer: Self-pay | Admitting: Internal Medicine

## 2020-06-16 VITALS — BP 155/73 | HR 91 | Temp 98.4°F | Ht 61.0 in | Wt 201.9 lb

## 2020-06-16 DIAGNOSIS — M25561 Pain in right knee: Secondary | ICD-10-CM | POA: Insufficient documentation

## 2020-06-16 DIAGNOSIS — W19XXXA Unspecified fall, initial encounter: Secondary | ICD-10-CM

## 2020-06-16 DIAGNOSIS — Y92009 Unspecified place in unspecified non-institutional (private) residence as the place of occurrence of the external cause: Secondary | ICD-10-CM | POA: Insufficient documentation

## 2020-06-16 NOTE — Patient Instructions (Signed)
Thank you for allowing [place] to provide your care today. Today we discussed your knee pain    I have ordered X-ray of your knees for you. I will call if any are abnormal.    Today we made no changes to your medications.    You can pick up your flexeril tomorrow  Please follow-up as needed.    Should you have any questions or concerns please call the internal medicine clinic at [phone].     Acute Knee Pain, Adult Many things can cause knee pain. Sometimes, knee pain is sudden (acute) and may be caused by damage, swelling, or irritation of the muscles and tissues that support your knee. The pain often goes away on its own with time and rest. If the pain does not go away, tests may be done to find out what is causing the pain. Follow these instructions at home: Pay attention to any changes in your symptoms. Take these actions to relieve your pain. If you have a knee sleeve or brace:   Wear the sleeve or brace as told by your doctor. Remove it only as told by your doctor.  Loosen the sleeve or brace if your toes: ? Tingle. ? Become numb. ? Turn cold and blue.  Keep the sleeve or brace clean.  If the sleeve or brace is not waterproof: ? Do not let it get wet. ? Cover it with a watertight covering when you take a bath or shower. Activity  Rest your knee.  Do not do things that cause pain.  Avoid activities where both feet leave the ground at the same time (high-impact activities). Examples are running, jumping rope, and doing jumping jacks.  Work with a physical therapist to make a safe exercise program, as told by your doctor. Managing pain, stiffness, and swelling   If told, put ice on the knee: ? Put ice in a plastic bag. ? Place a towel between your skin and the bag. ? Leave the ice on for 20 minutes, 2-3 times a day.  If told, put pressure (compression) on your injured knee to control swelling, give support, and help with discomfort. Compression may be done with an  elastic bandage. General instructions  Take all medicines only as told by your doctor.  Raise (elevate) your knee while you are sitting or lying down. Make sure your knee is higher than your heart.  Sleep with a pillow under your knee.  Do not use any products that contain nicotine or tobacco. These include cigarettes, e-cigarettes, and chewing tobacco. These products may slow down healing. If you need help quitting, ask your doctor.  If you are overweight, work with your doctor and a food expert (dietitian) to set goals to lose weight. Being overweight can make your knee hurt more.  Keep all follow-up visits as told by your doctor. This is important. Contact a doctor if:  The knee pain does not stop.  The knee pain changes or gets worse.  You have a fever along with knee pain.  Your knee feels warm when you touch it.  Your knee gives out or locks up. Get help right away if:  Your knee swells, and the swelling gets worse.  You cannot move your knee.  You have very bad knee pain. Summary  Many things can cause knee pain. The pain often goes away on its own with time and rest.  Your doctor may do tests to find out the cause of the pain.  Pay attention  to any changes in your symptoms. Relieve your pain with rest, medicines, light activity, and use of ice.  Get help right away if you cannot move your knee or your knee pain is very bad. This information is not intended to replace advice given to you by your health care provider. Make sure you discuss any questions you have with your health care provider. Document Revised: 04/18/2018 Document Reviewed: 04/18/2018 Elsevier Patient Education  2020 [employer].

## 2020-06-16 NOTE — Progress Notes (Signed)
   CC: Fall  HPI: [name] is a 59 y.o. with PMH listed below presenting with complaint of fall. Please see problem based assessment and plan for further details.  Past Medical History:  Diagnosis Date  . Chronic interstitial cystitis    Followed by Dr. Marcine Gray  . COPD (chronic obstructive pulmonary disease) (HCC)   . DDD (degenerative disc disease), lumbar   . Degeneration of cervical intervertebral disc   . Depression   . Diabetes mellitus, type 2 (HCC) 2007  . Heme positive stool    EGD on 04/08/2010 by Dr. Ewing Gray showed chronic gastritis and a few gastric polyps; pathology showed a fundic gland polyp.  Colonoscopy on 04/08/2010 showed small external and internal hemorrhoids, and a few benign-appearing diminutive polyps in the rectum, the distal sigmoid colon, and in the distal descending colon; pathology showed hyperplastic polyps.   . Hypertension   . Hypothyroidism   . Iron deficiency anemia   . Spinal stenosis    Review of Systems: Review of Systems  Constitutional: Negative for chills, fever and malaise/fatigue.  Eyes: Negative for blurred vision.  Respiratory: Negative for shortness of breath.   Cardiovascular: Negative for chest pain, palpitations and leg swelling.  Gastrointestinal: Negative for nausea and vomiting.  Musculoskeletal: Positive for back pain, falls and joint pain. Negative for myalgias.  All other systems reviewed and are negative.    Physical Exam: Vitals:   06/16/20 1511  BP: (!) 155/73  Pulse: 91  Temp: 98.4 F (36.9 C)  TempSrc: Oral  SpO2: 98%  Weight: 201 lb 14.4 oz (91.6 kg)  Height: 5\' 1"  (1.549 m)   Gen: Well-developed, well nourished, NAD HEENT: NCAT head, hearing intact CV: RRR, S1, S2 normal Pulm: CTAB, No rales, no wheezes Extm: Exquisite tenderness of on multiple areas of R knee, active/passive ROM limited by pain, no erythema, edema or swelling, Lidocaine patch in place. Skin: Dry, Warm, normal  turgor  Assessment & Plan:   Fall at home Mary Gray is a 59 yo F w/ PMH of HLD, GAD, GERD, Hypothyroidism, T2DM presenting to [facility] after a fall. She mentions she was in her usual state of health until the 26th when she had a mechanical fall at night. She mentions landing on her knee with significant tenderness of her R knee. She also endorse exacerbation of her chronic hip pain after the fall. She states that she has been having difficulty with weight bearing due to the pain and even has required assistance to get to her bathroom. She denies any loss of consciousness or dizziness or headache. Denies any fevers, chills, nausea, vomiting. Currently taking ibuprofen with mild improvement. States that flexeril has helped in the past.  A/P Present with R knee pain after fall. Pain out of proportion with exam as no evidence of ecchymosis, edema or swelling. Will get X-ray to rule out fracture. Noted to have previously been weaned off opioid medications. Will advise to continue NSAIDs for pain control  - R Knee X-ray - C/w ibuprofen for pain - Rest, Ice, Compression, Elevation     Patient discussed with Dr. 27  -Oswaldo Done, PGY3 [facility] Internal Medicine Pager: [phone]

## 2020-06-16 NOTE — Assessment & Plan Note (Addendum)
Mary Gray is a 59 yo F w/ PMH of HLD, GAD, GERD, Hypothyroidism, T2DM presenting to [facility] after a fall. She mentions she was in her usual state of health until the 26th when she had a mechanical fall at night. She mentions landing on her knee with significant tenderness of her R knee. She also endorse exacerbation of her chronic hip pain after the fall. She states that she has been having difficulty with weight bearing due to the pain and even has required assistance to get to her bathroom. She denies any loss of consciousness or dizziness or headache. Denies any fevers, chills, nausea, vomiting. Currently taking ibuprofen with mild improvement. States that flexeril has helped in the past.  A/P Present with R knee pain after fall. Pain out of proportion with exam as no evidence of ecchymosis, edema or swelling. Will get X-ray to rule out fracture. Noted to have previously been weaned off opioid medications. Will advise to continue NSAIDs for pain control  - R Knee X-ray - C/w ibuprofen for pain - Rest, Ice, Compression, Elevation

## 2020-06-17 ENCOUNTER — Telehealth: Payer: Self-pay | Admitting: Internal Medicine

## 2020-06-17 DIAGNOSIS — W19XXXA Unspecified fall, initial encounter: Secondary | ICD-10-CM

## 2020-06-17 MED ORDER — DICLOFENAC SODIUM 1 % EX GEL: 4.0000 g | Freq: Four times a day (QID) | CUTANEOUS | 2 refills | Status: DC

## 2020-06-17 NOTE — Telephone Encounter (Signed)
Attempted to call Mary Gray to discuss results of her X-ray. Patient did not pick up. Voicemail not set up.

## 2020-06-17 NOTE — Telephone Encounter (Signed)
Both of these meds were refilled by PCP on 06/15/2020. Kinnie Feil, BSN, RN-BC

## 2020-06-17 NOTE — Telephone Encounter (Signed)
Discussed with Mary Gray regarding her negative Knee X-ray. Advised that her pain is likely due to exacerbation of osteoarthritis and advised to take NSAIDs as needed for pain. She mentions not wishing to take NSAIDs as it 'tear up my stomach.' Advised to use topical NSAIDs for pain relief. Script for topical diclofenac sent to pharmacy.

## 2020-06-17 NOTE — Progress Notes (Signed)
Internal Medicine Clinic Attending  I saw and evaluated the patient.  I personally confirmed the key portions of the history and exam documented by Dr. Lee and I reviewed pertinent patient test results.  The assessment, diagnosis, and plan were formulated together and I agree with the documentation in the resident's note.  

## 2020-06-17 NOTE — Telephone Encounter (Signed)
Pt rtn phone call about her x ray results.

## 2020-06-21 ENCOUNTER — Other Ambulatory Visit: Payer: Self-pay | Admitting: Student in an Organized Health Care Education/Training Program

## 2020-06-21 DIAGNOSIS — M48061 Spinal stenosis, lumbar region without neurogenic claudication: Secondary | ICD-10-CM

## 2020-06-21 DIAGNOSIS — M62830 Muscle spasm of back: Secondary | ICD-10-CM

## 2020-06-21 NOTE — Telephone Encounter (Signed)
Refill Request  lidocaine (LIDODERM) 5 %  [id number] - [place], [place] - [address]

## 2020-06-22 MED ORDER — LIDOCAINE 5 % EX PTCH: 1.0000 | MEDICATED_PATCH | Freq: Two times a day (BID) | CUTANEOUS | 0 refills | Status: DC

## 2020-06-23 ENCOUNTER — Encounter: Payer: Self-pay | Admitting: Internal Medicine

## 2020-06-23 ENCOUNTER — Ambulatory Visit (INDEPENDENT_AMBULATORY_CARE_PROVIDER_SITE_OTHER): Payer: Medicare Other | Admitting: Internal Medicine

## 2020-06-23 DIAGNOSIS — M48061 Spinal stenosis, lumbar region without neurogenic claudication: Secondary | ICD-10-CM | POA: Diagnosis not present

## 2020-06-23 DIAGNOSIS — M62830 Muscle spasm of back: Secondary | ICD-10-CM

## 2020-06-23 MED ORDER — NAPROXEN 500 MG PO TABS: 500.0000 mg | ORAL_TABLET | Freq: Two times a day (BID) | ORAL | 0 refills | Status: DC

## 2020-06-23 MED ORDER — CYCLOBENZAPRINE HCL 10 MG PO TABS: 10.0000 mg | ORAL_TABLET | Freq: Three times a day (TID) | ORAL | 0 refills | Status: DC | PRN

## 2020-06-23 MED ORDER — LIDOCAINE 5 % EX PTCH: 1.0000 | MEDICATED_PATCH | Freq: Two times a day (BID) | CUTANEOUS | 0 refills | Status: DC

## 2020-06-23 NOTE — Assessment & Plan Note (Signed)
Patient recently seen in clinic after mechanical fall from ground-level and struck right knee.  She was evaluated and images of her knee normal.  This fall aggravated chronic low back pain.  She reports the pain starts in the lumbar region of her back and radiates down her right side to her right knee.  Given she is  postmenopausal she is at risk for osteoporosis.  She has no red flag symptoms on exam. Dr.Vincent is her PCP and she is scheduled with him 8/26.   Plan: plain films to rule out compression fracture.  Referral to physical therapy Given instructions on lower back stretching Prescribed more lidocaine patches Flexeril 20 tablets Changed ibuprofen to naproxen given complaint of GI discomfort with NSAID in the past

## 2020-06-23 NOTE — Progress Notes (Signed)
   CC: Back pain radiating to knee  HPI:Mary Gray is a 59 y.o. female who presents for evaluation of low back pain which radiates to her right knee. Please see individual problem based A/P for details.  Past Medical History:  Diagnosis Date  . Chronic interstitial cystitis    Followed by Dr. Marcine Matar  . COPD (chronic obstructive pulmonary disease) (HCC)   . DDD (degenerative disc disease), lumbar   . Degeneration of cervical intervertebral disc   . Depression   . Diabetes mellitus, type 2 (HCC) 2007  . Heme positive stool    EGD on 04/08/2010 by Dr. Ewing Schlein showed chronic gastritis and a few gastric polyps; pathology showed a fundic gland polyp.  Colonoscopy on 04/08/2010 showed small external and internal hemorrhoids, and a few benign-appearing diminutive polyps in the rectum, the distal sigmoid colon, and in the distal descending colon; pathology showed hyperplastic polyps.   . Hypertension   . Hypothyroidism   . Iron deficiency anemia   . Spinal stenosis    Review of Systems:    No fever or chills No loss of bowel or bladder No saddle anesthesia  Physical Exam: Vitals:   06/23/20 1458  BP: 122/76  Pulse: 96  Temp: 98.2 F (36.8 C)  TempSrc: Oral  SpO2: 96%  Weight: 198 lb 14.4 oz (90.2 kg)  Height: 5\' 1"  (1.549 m)    General: Obese, sitting in wheelchair, ambulates on her own at baseline Cardiovascular: Normal rate, regular rhythm.  No murmurs, rubs, or gallops Pulmonary : Effort normal, breath sounds normal. No wheezes, rales, or rhonchi Musculoskeletal: no swelling , deformity, tender in over lumbar, approximately L2   Assessment & Plan:   See Encounters Tab for problem based charting.  Patient discussed with Dr. 

## 2020-06-23 NOTE — Patient Instructions (Addendum)
Thank you for trusting me with your care. To recap, today we discussed the following:   Back pain - cyclobenzaprine (FLEXERIL) 10 MG tablet; Take 1 tablet (10 mg total) by mouth 3 (three) times daily as needed for muscle spasms.  Dispense: 20 tablet; Refill: 0 - lidocaine (LIDODERM) 5 %; Place 1 patch onto the skin every 12 (twelve) hours. Remove & Discard patch within 12 hours or as directed by MD  Dispense: 10 patch; Refill: 0 - lidocaine (LIDODERM) 5 %; Place 1 patch onto the skin every 12 (twelve) hours. Remove & Discard patch within 12 hours or as directed by MD  Dispense: 10 patch; Refill: 0 - DG Lumbar Spine Complete; Future  Follow up with Dr.Vincent on the 26th and I will call you with the result of your lumbar spine.   My best.Mary Gray have an eye doctor appointment with  Dr. Glenford Peers on:  Tuesday, AUGUST 24 at 1:00 PM  [address] [place], [place] [address] Phone: [phone]

## 2020-06-24 ENCOUNTER — Other Ambulatory Visit: Payer: Self-pay | Admitting: Student

## 2020-06-24 ENCOUNTER — Telehealth: Payer: Self-pay | Admitting: *Deleted

## 2020-06-24 DIAGNOSIS — M1712 Unilateral primary osteoarthritis, left knee: Secondary | ICD-10-CM

## 2020-06-24 DIAGNOSIS — K219 Gastro-esophageal reflux disease without esophagitis: Secondary | ICD-10-CM

## 2020-06-24 MED ORDER — CELECOXIB 200 MG PO CAPS: 200.0000 mg | ORAL_CAPSULE | Freq: Two times a day (BID) | ORAL | 0 refills | Status: DC

## 2020-06-24 MED ORDER — FAMOTIDINE 20 MG PO TABS: 20.0000 mg | ORAL_TABLET | Freq: Two times a day (BID) | ORAL | 0 refills | Status: DC

## 2020-06-24 NOTE — Telephone Encounter (Signed)
I reviewed her chart and wrote her 2 weeks of celecoxib which should have less GI side effects, as well as famotidine. She has already been prescribed a muscle relaxant, lidocaine patch, and voltaren gel. She can also try tylenol for pain relief. Thank you.

## 2020-06-24 NOTE — Telephone Encounter (Signed)
rtc to pt, read her dr's note, she says thank you and she will try it

## 2020-06-24 NOTE — Telephone Encounter (Signed)
Pt calls and states the naproxen bothers her stomach and so she needs something else. She states she cant come over her time and time again she needs something to ease her pain, not these things that doctors know are going to make her worse, she needs something that works. Please advise

## 2020-06-24 NOTE — Telephone Encounter (Signed)
Please call pt back.

## 2020-06-24 NOTE — Addendum Note (Signed)
Addended by: Neomia Dear on: 06/24/2020 05:39 PM   Modules accepted: Orders

## 2020-06-25 NOTE — Progress Notes (Signed)
Internal Medicine Clinic Attending  Case discussed with Dr. Steen  At the time of the visit.  We reviewed the resident's history and exam and pertinent patient test results.  I agree with the assessment, diagnosis, and plan of care documented in the resident's note.  

## 2020-06-30 ENCOUNTER — Telehealth: Payer: Self-pay | Admitting: *Deleted

## 2020-06-30 DIAGNOSIS — M48061 Spinal stenosis, lumbar region without neurogenic claudication: Secondary | ICD-10-CM

## 2020-06-30 DIAGNOSIS — M62830 Muscle spasm of back: Secondary | ICD-10-CM

## 2020-06-30 MED ORDER — LIDOCAINE 5 % EX PTCH: 1.0000 | MEDICATED_PATCH | Freq: Two times a day (BID) | CUTANEOUS | 0 refills | Status: DC

## 2020-06-30 NOTE — Telephone Encounter (Signed)
Pt states since she started celeobrex she has restless leg syndrome and at night she just picks at things and cant sleep. She states she just cant take the celebrex and needs something to help her pain. Has appt 8/23 w/ pcp but states she cant go that long

## 2020-07-01 ENCOUNTER — Telehealth: Payer: Self-pay

## 2020-07-01 NOTE — Telephone Encounter (Signed)
appt via front office. With clinic provider

## 2020-07-01 NOTE — Telephone Encounter (Signed)
Patient called in asking what other med has been sent to pharmacy as lidocaine patches won't be sufficient to help with the nerve pain in her legs. States she cannot take naproxen or celebrex and is almost out of flexeril. Has also been using voltaren gel and can still "hardly walk." Patient is requesting another pain med in addition to lidocaine patches. Please advise. Kinnie Feil, BSN, RN-BC

## 2020-07-01 NOTE — Telephone Encounter (Signed)
I don't have anything to offer over the phone. I can evaluate at our next clinic visit. Or she can make an [facility] appointment if it can't wait until then.

## 2020-07-01 NOTE — Telephone Encounter (Signed)
Please call pt back about meds.  

## 2020-07-01 NOTE — Telephone Encounter (Signed)
Dr Oswaldo Done. Pt wants something to replace the celebrex. Please see 8/11 note

## 2020-07-02 ENCOUNTER — Other Ambulatory Visit: Payer: Self-pay

## 2020-07-02 ENCOUNTER — Ambulatory Visit (HOSPITAL_COMMUNITY)
Admission: RE | Admit: 2020-07-02 | Discharge: 2020-07-02 | Disposition: A | Discharge status: Home / Self Care | Payer: Medicare Other | Source: Ambulatory Visit | Attending: Internal Medicine | Admitting: Internal Medicine

## 2020-07-02 ENCOUNTER — Ambulatory Visit (INDEPENDENT_AMBULATORY_CARE_PROVIDER_SITE_OTHER): Payer: Medicare Other | Admitting: Internal Medicine

## 2020-07-02 DIAGNOSIS — I10 Essential (primary) hypertension: Secondary | ICD-10-CM

## 2020-07-02 DIAGNOSIS — M48061 Spinal stenosis, lumbar region without neurogenic claudication: Secondary | ICD-10-CM

## 2020-07-02 DIAGNOSIS — M62830 Muscle spasm of back: Secondary | ICD-10-CM

## 2020-07-02 DIAGNOSIS — E669 Obesity, unspecified: Secondary | ICD-10-CM | POA: Diagnosis not present

## 2020-07-02 MED ORDER — LIDOCAINE 5 % EX PTCH: 3.0000 | MEDICATED_PATCH | Freq: Two times a day (BID) | CUTANEOUS | 0 refills | Status: DC

## 2020-07-02 MED ORDER — CYCLOBENZAPRINE HCL 10 MG PO TABS: 10.0000 mg | ORAL_TABLET | Freq: Three times a day (TID) | ORAL | 0 refills | Status: DC | PRN

## 2020-07-02 NOTE — Assessment & Plan Note (Addendum)
Patient presented with acute on chronic back and rt knee pain. This has been a chronic and uncontrolled problem.  She has been in clinic few times for similar issue and mostly low back and rt knee and thigh pain. Rt knee X ray did not show any fx. Patient was not able to take NSAID due to stomach pain and did not like Celecoxib that prescribed last visit because it cause her restless leg syndrome worse. Flexeril, lidocaine patch and Voltaren gel helped somehow.  and her knee exam does not show specific finding. She is tender on entire leg and even on left leg. No red flag. She becomes tearful during taking Hx. She is able to cooperate with some of p/e. She has tenderness to palpation of all area of her rt leg and rt low back and even left leg. Has tenderness of lumbar spine. No neuro deficit. SLR is not reliable (patient refuses to lay down in exam table due to pain).  Assessment and plan: Her lumbar canal stenosis can cause some of her symptoms but otherwise unclear etiology for her non specific continuous pain.  No claudication and has a good distal pulse. The only localized, specific and consistent finding on exam is lumbar spine tenderness. Lumbar X ray ordered last visit but has not been done. Will obtain Lumbar X ray.  I send refill for flexeril and lidocaine patch that helped before.  Short term prescription of Tramadol or opioid pain med is an option but will need more discussion as she had prior Hx of opioid pain medication use and difficulty to follow or wean off. (She denies dependency though).  I will wait for lumbar X ray result and also defer that to next visit evaluation as appointment with Dr. Oswaldo Done in 10 days.   -Sent refill for Flexeril and Lidocaine patch -Continue Voltaren gel -Lumbar spine X ray -Ms. Lela informed patient that she has a PT appointment in few days. -F/u in clinic in 10 days with Dr. Oswaldo Done -Strict ED visit precautions reviewed with patient.

## 2020-07-02 NOTE — Patient Instructions (Addendum)
Thank you for allowing [place] to provide your care today.  Today we discussed your back pain. We send you to the radiology to take an X ray of your back (It was already ordered but not done). I send a refill for your muscle relaxant to be taken as needed. Continue using Voltaren gel, lidocaine patch (refill sent), as needed for pain. You have a physical therapy appointment 8/16.   Please take rest of your medications as before.    Please come back to clinic to 8/23 to see Dr. Oswaldo Done or earlier if your symptoms get worse or not improved. As always, if having severe symptoms, please seek medical attention at emergency room. Should you have any questions or concerns please call the internal medicine clinic at [phone].    Thank you!

## 2020-07-02 NOTE — Progress Notes (Signed)
   CC:   HPI:  [name] is a 59 y.o. female with PMHx as documented below, presented for acute on chronic low back and rt knee pain. Please refer to problem based charting for further details and assessment and plan of current problem and chronic medical conditions.   Past Medical History:  Diagnosis Date  . Chronic interstitial cystitis    Followed by Mary Gray  . COPD (chronic obstructive pulmonary disease) (HCC)   . DDD (degenerative disc disease), lumbar   . Degeneration of cervical intervertebral disc   . Depression   . Diabetes mellitus, type 2 (HCC) 2007  . Heme positive stool    EGD on 04/08/2010 by Mary Gray showed chronic gastritis and a few gastric polyps; pathology showed a fundic gland polyp.  Colonoscopy on 04/08/2010 showed small external and internal hemorrhoids, and a few benign-appearing diminutive polyps in the rectum, the distal sigmoid colon, and in the distal descending colon; pathology showed hyperplastic polyps.   . Hypertension   . Hypothyroidism   . Iron deficiency anemia   . Spinal stenosis    Review of Systems:  Review of Systems  Constitutional: Negative for chills and fever.  Respiratory: Negative for cough.   Gastrointestinal: Negative for abdominal pain, nausea and vomiting.  Musculoskeletal: Positive for back pain and falls.    Physical Exam:  Vitals:   07/02/20 1352  BP: (!) 169/87  Pulse: 100  Temp: 98.4 F (36.9 C)  TempSrc: Oral  SpO2: 98%  Weight: 201 lb 6.4 oz (91.4 kg)  Height: 5\' 1"  (1.549 m)   Physical Exam Constitutional:      Appearance: She is obese.     Comments: She is uncomfortable due to pain and complains of pain.   Cardiovascular:     Rate and Rhythm: Normal rate and regular rhythm.     Heart sounds: Normal heart sounds. No murmur heard.   Pulmonary:     Effort: Pulmonary effort is normal.     Breath sounds: Normal breath sounds. No wheezing or rales.  Abdominal:     General: There is no  distension.     Palpations: Abdomen is soft.     Tenderness: There is no abdominal tenderness.  Musculoskeletal:        General: Tenderness present.     Right lower leg: No edema.     Left lower leg: No edema.     Comments: Is tender every where but physical exam finding is mostly inconsistent and nonspecific except tenderness of lower lumbar spines.  Hip joint exam is negative: Internal and external rotation of hip are intact. Knee exam is un significant and non focal.  Skin:    General: Skin is warm and dry.     Findings: No rash.  Neurological:     General: No focal deficit present.     Mental Status: She is alert and oriented to person, place, and time.     Motor: No weakness.     Comments: Rt side SLR positive?  Psychiatric:        Mood and Affect: Mood normal. Affect is tearful.        Speech: Speech normal.        Behavior: Behavior normal.        Thought Content: Thought content normal.      Assessment & Plan:   See Encounters Tab for problem based charting.  Patient discussed with Dr. 

## 2020-07-04 NOTE — Assessment & Plan Note (Signed)
Patient blood pressure today was elevated at 169/87. Improved to ~160/80 when checked again. Patient is tearful because of pain and it can be associated with her high BP (also non adherence to her medications). She focused on the pain during entire visit today. Will dedicate more time to discuss her HTN next visit.

## 2020-07-05 ENCOUNTER — Telehealth: Payer: Self-pay

## 2020-07-05 ENCOUNTER — Ambulatory Visit: Payer: Medicare Other | Attending: Internal Medicine

## 2020-07-05 DIAGNOSIS — M48061 Spinal stenosis, lumbar region without neurogenic claudication: Secondary | ICD-10-CM

## 2020-07-05 MED ORDER — METHOCARBAMOL 750 MG PO TABS: ORAL_TABLET | ORAL | 0 refills | Status: DC

## 2020-07-05 MED ORDER — DICLOFENAC SODIUM 75 MG PO TBEC: 75.0000 mg | DELAYED_RELEASE_TABLET | Freq: Two times a day (BID) | ORAL | 0 refills | Status: DC

## 2020-07-05 NOTE — Telephone Encounter (Signed)
Requesting x-ray results, please call pt back.  

## 2020-07-05 NOTE — Telephone Encounter (Signed)
Mary Gray continues to have low back pain and states she is in too much pain to keep her referral to PT. If she is able to get some relief she would be willing to go to PT appointment. She has been tried on multiple different NSAID's and given muscle relaxer . Her recent xray showed no acute fracture and consistent findings with chronic changes seen on previous imaging. She is having GI upset with other NSAID'S, so we will try enteric coated diclofenac. In addition we will try another muscle relaxer. Encourage patient to follow up with PT referral. In addition to keep her appointment on 8/26 with PCP to discuss management of chronic back pain.

## 2020-07-05 NOTE — Telephone Encounter (Signed)
I called twice this morning, will attempt again.

## 2020-07-05 NOTE — Progress Notes (Signed)
Internal Medicine Clinic Attending  Case discussed with Dr. Masoudi at the time of the visit.  We reviewed the resident's history and exam and pertinent patient test results.  I agree with the assessment, diagnosis, and plan of care documented in the resident's note.  Breydan Shillingburg, M.D., Ph.D.  

## 2020-07-05 NOTE — Telephone Encounter (Signed)
Requesting to get the results by today, states she's having pain.

## 2020-07-08 NOTE — Addendum Note (Signed)
Addended by: Dorie Rank E on: 07/08/2020 11:10 AM   Modules accepted: Orders

## 2020-07-09 ENCOUNTER — Other Ambulatory Visit: Payer: Self-pay | Admitting: Student in an Organized Health Care Education/Training Program

## 2020-07-09 DIAGNOSIS — M48061 Spinal stenosis, lumbar region without neurogenic claudication: Secondary | ICD-10-CM

## 2020-07-09 MED ORDER — DICLOFENAC SODIUM 75 MG PO TBEC: 75.0000 mg | DELAYED_RELEASE_TABLET | Freq: Two times a day (BID) | ORAL | 0 refills | Status: DC

## 2020-07-09 NOTE — Telephone Encounter (Signed)
Pt requesting a call back about her medications.   

## 2020-07-09 NOTE — Telephone Encounter (Signed)
Please call pt back about meds.  

## 2020-07-09 NOTE — Telephone Encounter (Signed)
RTC, patient still c/o back spasms.  Requesting a refill on diclofenac 75 mg and flexeril 10mg  (which has fallen off med list). States she has a f/u with PCP on 8/23 and would like enough to hold her over until PCP appt. Thank you, SChaplin, RN,BSN

## 2020-07-12 ENCOUNTER — Encounter: Payer: Self-pay | Admitting: Student in an Organized Health Care Education/Training Program

## 2020-07-12 ENCOUNTER — Other Ambulatory Visit: Payer: Self-pay

## 2020-07-12 ENCOUNTER — Ambulatory Visit (INDEPENDENT_AMBULATORY_CARE_PROVIDER_SITE_OTHER): Payer: Medicare Other | Admitting: Student in an Organized Health Care Education/Training Program

## 2020-07-12 VITALS — BP 162/79 | HR 91 | Temp 98.4°F | Wt 202.0 lb

## 2020-07-12 DIAGNOSIS — F411 Generalized anxiety disorder: Secondary | ICD-10-CM

## 2020-07-12 DIAGNOSIS — E1165 Type 2 diabetes mellitus with hyperglycemia: Secondary | ICD-10-CM

## 2020-07-12 DIAGNOSIS — M1711 Unilateral primary osteoarthritis, right knee: Secondary | ICD-10-CM | POA: Diagnosis not present

## 2020-07-12 DIAGNOSIS — M48061 Spinal stenosis, lumbar region without neurogenic claudication: Secondary | ICD-10-CM | POA: Diagnosis not present

## 2020-07-12 MED ORDER — ACCU-CHEK SOFTCLIX LANCETS MISC: 3 refills | Status: DC

## 2020-07-12 MED ORDER — CENTRUM PO CHEW: 1.0000 | CHEWABLE_TABLET | Freq: Every day | ORAL | 3 refills | Status: AC

## 2020-07-12 MED ORDER — ACCU-CHEK GUIDE VI STRP: ORAL_STRIP | 3 refills | Status: DC

## 2020-07-12 MED ORDER — TRAMADOL HCL 50 MG PO TABS: 50.0000 mg | ORAL_TABLET | Freq: Every day | ORAL | 1 refills | Status: DC

## 2020-07-12 MED ORDER — VITAMIN B-12 1000 MCG PO TABS: 1000.0000 ug | ORAL_TABLET | Freq: Every day | ORAL | 3 refills | Status: DC

## 2020-07-12 NOTE — Progress Notes (Signed)
   Assessment and Plan:  See Encounters tab for problem-based medical decision making.   __________________________________________________________  HPI:   59 year old person living with chronic pain, anxiety, obesity here for follow-up of exacerbated pain in her low back and right knee.  She had a fall at home shortly after I last saw her in clinic.  This has caused worsening pain and declining functional status.  She is seen several other physicians in our acute care clinic, has tried a variety of short acting medication nonsteroidal anti-inflammatories as well as muscle relaxers.  Has tried topical NSAIDs and lidocaine.  She reports inadequate benefit from any of these interventions.  Had x-rays that ruled out fracture.  Is asking to go back onto the medication usually had her on before, this was hydrocodone.  Also asking for alprazolam to help with her moods.  She feels if she can get the pain under control it will not help her get more activity and be able to do therapy in the future.  No fevers, no chills.  __________________________________________________________  Problem List: Patient Active Problem List   Diagnosis Date Noted  . Spinal stenosis of lumbar region 03/01/2011    Priority: High  . Diabetes mellitus type 2, uncontrolled (HCC) 07/06/2006    Priority: High  . Generalized anxiety disorder 07/06/2006    Priority: High  . B12 deficiency 11/25/2018    Priority: Medium  . AVN (avascular necrosis of bone) (HCC) 03/07/2013    Priority: Medium  . Hypothyroidism 07/06/2006    Priority: Medium  . Essential hypertension 07/06/2006    Priority: Medium  . Urinary, incontinence, stress female 09/09/2018    Priority: Low  . Arthritis of right knee 05/01/2018    Priority: Low  . Chronic venous insufficiency 02/26/2017    Priority: Low  . Candidal skin infection 03/03/2014    Priority: Low  . Preventative health care 05/14/2013    Priority: Low  . Hyperlipemia 07/06/2006     Priority: Low  . GERD 07/06/2006    Priority: Low    Medications: Reconciled today in Epic __________________________________________________________  Physical Exam:  Vital Signs: Vitals:   07/12/20 1022  BP: (!) 162/79  Pulse: 91  Temp: 98.4 F (36.9 C)  TempSrc: Oral  SpO2: 98%  Weight: 202 lb (91.6 kg)    Gen: Well appearing, NAD CV: RRR, no murmurs Pulm: Normal effort, CTA throughout, no wheezing Ext: Warm, no edema, right knee brace over her clothes, normal range of motion, no crepitus, pain with palpation and movement of the right knee, no joint effusion.

## 2020-07-12 NOTE — Addendum Note (Signed)
Addended by: Baird Cancer on: 07/12/2020 11:35 AM   Modules accepted: Orders

## 2020-07-12 NOTE — Assessment & Plan Note (Signed)
Uncontrolled anxiety, also depression likely.  Numerous negative social determinants of health.  Offered the patient antidepressants today with Cymbalta.  She is requesting alprazolam which we had used previously.  Because of poor follow-up and a history of diversion, we are not going to offer further short acting benzodiazepines.  We are starting a daily tramadol which may have a small SNRI effect.  We will follow up with her in 1 month.  I hope in the future she will be more open to accepting other anxiety medications.

## 2020-07-12 NOTE — Assessment & Plan Note (Signed)
Patient continues to have an exacerbation of low back pain that is function limiting for the last 4 weeks following a fall at home.  X-rays of the low back and the right knee have ruled out fracture as the cause of this pain exacerbation.  She has long history of lumbar spine disease with many MRIs, most recently dating to 42.  Historically has not been interested in invasive interventions like surgery or injections.  We used low-dose opioid medications for many years, but around 2019 we had problems with follow-up in my clinic as well as high risk for diversion.  She is having some difficulty with facing the reality that I am not going to reinitiate daily therapy with hydrocodone.  I offered her daily Cymbalta or daily tramadol, she feels pretty apathetic about these options as she has tried them in the past.  She is very sensitive to many medications, often with side effects like upset stomach.  We can try tramadol 50 mg once daily, close follow-up in 1 month.  Gave the patient precautions and our expectations.  I am also going to refer the patient to sports medicine center, she is interested in learning more physical therapy maneuvers.  I told the patient that our clinic has offered about everything that we have to support her with this musculoskeletal pain, we need to work on the comorbid depression and anxiety, and unfortunately she has numerous negative social determinants of health that are also at play here.

## 2020-08-06 ENCOUNTER — Ambulatory Visit (INDEPENDENT_AMBULATORY_CARE_PROVIDER_SITE_OTHER): Payer: Medicare Other | Admitting: Internal Medicine

## 2020-08-06 ENCOUNTER — Encounter: Payer: Self-pay | Admitting: Internal Medicine

## 2020-08-06 ENCOUNTER — Other Ambulatory Visit: Payer: Self-pay

## 2020-08-06 DIAGNOSIS — M48061 Spinal stenosis, lumbar region without neurogenic claudication: Secondary | ICD-10-CM | POA: Diagnosis not present

## 2020-08-06 DIAGNOSIS — M1711 Unilateral primary osteoarthritis, right knee: Secondary | ICD-10-CM

## 2020-08-06 MED ORDER — DULOXETINE HCL 30 MG PO CPEP
30.0000 mg | ORAL_CAPSULE | Freq: Every day | ORAL | 2 refills | Status: DC
Start: 2020-08-06 — End: 2020-09-01

## 2020-08-06 MED ORDER — TRAMADOL HCL 50 MG PO TABS: 50.0000 mg | ORAL_TABLET | Freq: Every day | ORAL | 1 refills | Status: DC

## 2020-08-06 NOTE — Progress Notes (Signed)
  [facility] Internal Medicine Residency Telephone Encounter Continuity Care Appointment  HPI:   This telephone encounter was created for [name] on 08/06/2020 for the following purpose/cc R knee Pain, Chronic back pain.   Past Medical History:  Past Medical History:  Diagnosis Date  . Chronic interstitial cystitis    Followed by Dr. Marcine Matar  . COPD (chronic obstructive pulmonary disease) (HCC)   . DDD (degenerative disc disease), lumbar   . Degeneration of cervical intervertebral disc   . Depression   . Diabetes mellitus, type 2 (HCC) 2007  . Heme positive stool    EGD on 04/08/2010 by Dr. Ewing Schlein showed chronic gastritis and a few gastric polyps; pathology showed a fundic gland polyp.  Colonoscopy on 04/08/2010 showed small external and internal hemorrhoids, and a few benign-appearing diminutive polyps in the rectum, the distal sigmoid colon, and in the distal descending colon; pathology showed hyperplastic polyps.   . Hypertension   . Hypothyroidism   . Iron deficiency anemia   . Spinal stenosis       ROS:  Review of Systems  Constitutional: Negative for chills, fever and weight loss.  Respiratory: Negative for cough.   Gastrointestinal: Negative for abdominal pain, constipation, diarrhea, nausea and vomiting.  Musculoskeletal: Positive for back pain and joint pain.  Neurological: Negative for dizziness and headaches.     Assessment / Plan / Recommendations:   Please see A&P under problem oriented charting for assessment of the patient's acute and chronic medical conditions.   As always, pt is advised that if symptoms worsen or new symptoms arise, they should go to an urgent care facility or to to ER for further evaluation.   Consent and Medical Decision Making:   Patient discussed with Dr. Cleda Daub  This is a telephone encounter between Mary Gray and Dolan Amen on 08/06/2020 for Chronic . The visit was conducted with the patient located at  home and Dolan Amen at [facility]. The patient's identity was confirmed using their DOB and current address. The patient has consented to being evaluated through a telephone encounter and understands the associated risks (an examination cannot be done and the patient may need to come in for an appointment) / benefits (allows the patient to remain at home, decreasing exposure to coronavirus). I personally spent 15 minutes on medical discussion.

## 2020-08-09 ENCOUNTER — Encounter: Payer: Self-pay | Admitting: Internal Medicine

## 2020-08-09 ENCOUNTER — Telehealth: Payer: Self-pay | Admitting: Student in an Organized Health Care Education/Training Program

## 2020-08-09 NOTE — Assessment & Plan Note (Signed)
Patient with chronic arthritis of right knee with multiple visits and mediations for her arthritis presents for a telehealth visit for back and knee pain. She states that she has tried multiple medications in the past with little effect. She states that her tramadol helped provide some relief to her pain. We discussed her history of medications, went over her multiple xrays of her knees, and discussed possible surgical intervention at this time. Patient has a Hx of disinterest in surgical management, but agreed that she may need surgical evaluation at this time. Will continue to treat conservatively. Patient additionally has Hx of depression, will start Cymbalta today for dual benefit of treating her depression and pain.  - Cymbalta 30 mg daily - Refill tramadol  - Continue conservative management - Ortho referral

## 2020-08-09 NOTE — Telephone Encounter (Signed)
Pt f/u with Telehealth visit on 08/06/2020.  Pt would like to know what medication she is to take for her leg pain.

## 2020-08-09 NOTE — Assessment & Plan Note (Signed)
Patient presents for telehealth visit with complaints of her chronic back pain 2/2 to spinal stenosis. She states that her back continues to have 10/10 back pain. She has tried multiple medications. She is not a candidate for low dose opioid medications given her past history of diversion. We discussed that she is continuing to have pain after prolonged medical management and that it may be time to consider surgical or joint injections at this time. Patient states that due to her pain she is now considering possible surgical intervention. Will treat conservatively in the interim. Discussed starting Cymbalta today, patient states she will attempt a trial of therapy. This may have the added benefit of treating her depression.  - Refill Ultram  - Neurosurgery referral  - Conservative management  - Cymbalta 30 mg daily

## 2020-08-09 NOTE — Telephone Encounter (Signed)
Per 9/17 telehealth visit; informed pt, Tramadol was prescribed. Stated she called [facility] and was told no rx was received. I called [facility] - stated Tramadol has been ready since 9/17. Called pt back - informed of the above.Stated she also needs something muscle spasms; she had them all w/e.Told her I will let the doctor know of her request.

## 2020-08-10 ENCOUNTER — Other Ambulatory Visit: Payer: Self-pay

## 2020-08-10 DIAGNOSIS — K219 Gastro-esophageal reflux disease without esophagitis: Secondary | ICD-10-CM

## 2020-08-11 MED ORDER — ESOMEPRAZOLE MAGNESIUM 40 MG PO CPDR: 40.0000 mg | DELAYED_RELEASE_CAPSULE | Freq: Every day | ORAL | 5 refills | Status: DC

## 2020-08-12 NOTE — Progress Notes (Signed)
Internal Medicine Clinic Attending ? ?Case discussed with Dr. Winters  At the time of the visit.  We reviewed the resident?s history and exam and pertinent patient test results.  I agree with the assessment, diagnosis, and plan of care documented in the resident?s note.  ?

## 2020-08-17 ENCOUNTER — Telehealth: Payer: Self-pay | Admitting: Student in an Organized Health Care Education/Training Program

## 2020-08-17 ENCOUNTER — Telehealth: Payer: Self-pay | Admitting: Dietician

## 2020-08-17 DIAGNOSIS — E1165 Type 2 diabetes mellitus with hyperglycemia: Secondary | ICD-10-CM

## 2020-08-17 NOTE — Telephone Encounter (Signed)
Pt calling to f/u with her leg pain. Pt states her leg is still hurting and she needs another pain medication. Pt requesting a nurse to call.

## 2020-08-17 NOTE — Telephone Encounter (Signed)
Chart and history reviewed. It appears she has struggled with chronic pain due to lumbar stenosis but has been maximized on medication therapy in the past. We can try a short term dose of increased NSAIDs, but I think the most important thing will be following up with the orthopedic doctor tomorrow. She can make an appointment to follow-up with [place], but from reviewing previous medications, she may have already tried most of the available options.

## 2020-08-17 NOTE — Telephone Encounter (Signed)
Patient had a Telehealth appt w/ Dr. Sande Brothers on 08/06/20 for back and knee pain.  Appt with Ortho noted tomorrow.  Dr. Sande Brothers, Please Advise. Thank you, Skip Estimable, RN,BSN

## 2020-08-17 NOTE — Telephone Encounter (Signed)
TC to patient to relay Dr. Al Decant instructions, VM obtained and VM box not set up yet, RN unable to leave message. SChaplin, RN,BSN

## 2020-08-17 NOTE — Telephone Encounter (Signed)
Telephone call to follow up eye care. She rescheduled her appointment to tomorrow.

## 2020-08-17 NOTE — Telephone Encounter (Signed)
Pt requesting Eye Exam referral to Dr. Glenford Peers.  Pt states it is time for her Yearly Check Up.  Please Advise if a referarl can be placed.

## 2020-08-18 ENCOUNTER — Encounter: Payer: Self-pay | Admitting: Orthopaedic Surgery

## 2020-08-18 ENCOUNTER — Other Ambulatory Visit: Payer: Self-pay

## 2020-08-18 ENCOUNTER — Ambulatory Visit (INDEPENDENT_AMBULATORY_CARE_PROVIDER_SITE_OTHER): Payer: Medicare Other | Admitting: Orthopaedic Surgery

## 2020-08-18 DIAGNOSIS — M1711 Unilateral primary osteoarthritis, right knee: Secondary | ICD-10-CM

## 2020-08-18 NOTE — Progress Notes (Signed)
Office Visit Note   Patient: Mary Gray           Date of Birth: 07/20/1961           MRN: [id number] Visit Date: 08/18/2020              Requested by: Gust Rung, DO [address]  [place],  [place] [address] PCP: Tyson Alias, MD   Assessment & Plan: Visit Diagnoses:  1. Unilateral primary osteoarthritis, right knee     Plan: [name] has evidence of osteoarthritis of the right knee predominantly in the medial compartment.  I reviewed her films on the PACS system with evidence of large osteophytes along the medial compartment both on the femur and the tibia associated with slight narrowing of the joint and minimal varus position.  Long discussion regarding the arthritis and what she may expect over time.  Will inject her knee with cortisone and monitor her response.  We talked about use of NSAIDs and even Voltaren gel as she does have some issues with stomach upset with use of the oral meds  Follow-Up Instructions: Return if symptoms worsen or fail to improve.   Orders:  No orders of the defined types were placed in this encounter.  No orders of the defined types were placed in this encounter.     Procedures: No procedures performed   Clinical Data: No additional findings.   Subjective: No chief complaint on file. Mary Gray visited the office today for evaluation of right knee pain.  She relates having some discomfort since June of this year without an obvious injury or trauma.  On occasion she feels like her knee "gives out".  She was seen at the outpatient clinic at [facility] and placed on Ultram and asked to follow-up with an orthopedist.  She does not use any ambulatory aid.  She does feel like her knee is weak.  No prior surgery.  I did review her films in the PACS system from July of this year there are large osteophytes along the medial lateral femoral condyle and the medial compartment with narrowing of the medial joint space and some  subchondral sclerosis predominately on the tibia.  HPI  Review of Systems   Objective: Vital Signs: LMP 01/17/2009   Physical Exam Constitutional:      Appearance: She is well-developed.  Eyes:     Pupils: Pupils are equal, round, and reactive to light.  Pulmonary:     Effort: Pulmonary effort is normal.  Skin:    General: Skin is warm and dry.  Neurological:     Mental Status: She is alert and oriented to person, place, and time.  Psychiatric:        Behavior: Behavior normal.     Ortho Exam awake alert and oriented x3.  Comfortable sitting.  Right knee was not hot red warm or swollen.  No effusion.  Predominately medial joint pain.  Some mild patella crepitation.  Full knee extension and flexion over 100 degrees without instability.  No popliteal pain or mass.  No calf pain.  +1 pulses.  Straight leg raise negative.  Painless range of motion right hip  Specialty Comments:  No specialty comments available.  Imaging: No results found.   PMFS History: Patient Active Problem List   Diagnosis Date Noted  . B12 deficiency 11/25/2018  . Urinary, incontinence, stress female 09/09/2018  . Unilateral primary osteoarthritis, right knee 05/01/2018  . Chronic venous insufficiency 02/26/2017  .  Candidal skin infection 03/03/2014  . Preventative health care 05/14/2013  . AVN (avascular necrosis of bone) (HCC) 03/07/2013  . Spinal stenosis of lumbar region 03/01/2011  . Hypothyroidism 07/06/2006  . Diabetes mellitus type 2, uncontrolled (HCC) 07/06/2006  . Hyperlipemia 07/06/2006  . Generalized anxiety disorder 07/06/2006  . Essential hypertension 07/06/2006  . GERD 07/06/2006   Past Medical History:  Diagnosis Date  . Chronic interstitial cystitis    Followed by Dr. Marcine Matar  . COPD (chronic obstructive pulmonary disease) (HCC)   . DDD (degenerative disc disease), lumbar   . Degeneration of cervical intervertebral disc   . Depression   . Diabetes mellitus,  type 2 (HCC) 2007  . Heme positive stool    EGD on 04/08/2010 by Dr. Ewing Schlein showed chronic gastritis and a few gastric polyps; pathology showed a fundic gland polyp.  Colonoscopy on 04/08/2010 showed small external and internal hemorrhoids, and a few benign-appearing diminutive polyps in the rectum, the distal sigmoid colon, and in the distal descending colon; pathology showed hyperplastic polyps.   . Hypertension   . Hypothyroidism   . Iron deficiency anemia   . Spinal stenosis     Family History  Problem Relation Age of Onset  . Heart attack Father 65  . Diabetes Mother   . Hypertension Mother   . Diabetes Sister   . Breast cancer Neg Hx   . Colon cancer Neg Hx     Past Surgical History:  Procedure Laterality Date  . CARPAL TUNNEL RELEASE  05/08/2000   By Dr. Katy Fitch. Sypher, Montez Hageman.   Social History   Occupational History  . Not on file  Tobacco Use  . Smoking status: Current Every Day Smoker    Packs/day: 0.50    Years: 15.00    Pack years: 7.50    Types: Cigarettes    Last attempt to quit: 01/18/1996    Years since quitting: 24.6  . Smokeless tobacco: Never Used  . Tobacco comment: 5-6 per day  Substance and Sexual Activity  . Alcohol use: No    Alcohol/week: 0.0 standard drinks  . Drug use: No  . Sexual activity: Not Currently     Mary Batman, MD   Note - This record has been created using [employer].  Chart creation errors have been sought, but may not always  have been located. Such creation errors do not reflect on  the standard of medical care.

## 2020-08-23 ENCOUNTER — Other Ambulatory Visit: Payer: Self-pay | Admitting: Student in an Organized Health Care Education/Training Program

## 2020-08-24 ENCOUNTER — Other Ambulatory Visit: Payer: Self-pay

## 2020-08-24 MED ORDER — FUROSEMIDE 40 MG PO TABS: 40.0000 mg | ORAL_TABLET | Freq: Every day | ORAL | 0 refills | Status: DC

## 2020-08-25 NOTE — Addendum Note (Signed)
Addended by: Dorie Rank E on: 08/25/2020 11:44 AM   Modules accepted: Orders

## 2020-08-31 ENCOUNTER — Other Ambulatory Visit: Payer: Self-pay | Admitting: Internal Medicine

## 2020-09-02 ENCOUNTER — Ambulatory Visit (INDEPENDENT_AMBULATORY_CARE_PROVIDER_SITE_OTHER): Payer: Medicare Other | Admitting: Sports Medicine

## 2020-09-02 ENCOUNTER — Other Ambulatory Visit: Payer: Self-pay

## 2020-09-02 ENCOUNTER — Encounter: Payer: Self-pay | Admitting: Sports Medicine

## 2020-09-02 DIAGNOSIS — I872 Venous insufficiency (chronic) (peripheral): Secondary | ICD-10-CM | POA: Diagnosis not present

## 2020-09-02 DIAGNOSIS — G2581 Restless legs syndrome: Secondary | ICD-10-CM | POA: Diagnosis not present

## 2020-09-02 DIAGNOSIS — M792 Neuralgia and neuritis, unspecified: Secondary | ICD-10-CM

## 2020-09-02 DIAGNOSIS — E538 Deficiency of other specified B group vitamins: Secondary | ICD-10-CM

## 2020-09-02 DIAGNOSIS — M79671 Pain in right foot: Secondary | ICD-10-CM

## 2020-09-02 DIAGNOSIS — M79672 Pain in left foot: Secondary | ICD-10-CM

## 2020-09-02 DIAGNOSIS — E1165 Type 2 diabetes mellitus with hyperglycemia: Secondary | ICD-10-CM

## 2020-09-02 MED ORDER — ROPINIROLE HCL 5 MG PO TABS
5.0000 mg | ORAL_TABLET | Freq: Every day | ORAL | 0 refills | Status: DC
Start: 2020-09-02 — End: 2020-09-29

## 2020-09-02 NOTE — Progress Notes (Signed)
Subjective: Mary Gray is a 59 y.o. female patient who presents to office for evaluation of numbness and tingling and restless legs bilateral.  Patient reports that in the past she was given Requip which seems to help but since she moved away she was unable to get any of her medications for the last year and has been suffering with pain but now she is back and is trying to reestablish care also reports that she has some edema and was previously on Lasix and is interested in further work-up for her pain.  Patient denies nausea vomiting fever chills or any constitutional symptoms.  Fasting blood sugar not recorded.  Review of Systems  Neurological: Positive for tingling.     Patient Active Problem List   Diagnosis Date Noted  . B12 deficiency 11/25/2018  . Urinary, incontinence, stress female 09/09/2018  . Unilateral primary osteoarthritis, right knee 05/01/2018  . Chronic venous insufficiency 02/26/2017  . Candidal skin infection 03/03/2014  . Preventative health care 05/14/2013  . AVN (avascular necrosis of bone) (HCC) 03/07/2013  . Spinal stenosis of lumbar region 03/01/2011  . Hypothyroidism 07/06/2006  . Diabetes mellitus type 2, uncontrolled (HCC) 07/06/2006  . Hyperlipemia 07/06/2006  . Generalized anxiety disorder 07/06/2006  . Essential hypertension 07/06/2006  . GERD 07/06/2006    Current Outpatient Medications on File Prior to Visit  Medication Sig Dispense Refill  . Accu-Chek Softclix Lancets lancets Check blood sugar 1 time per day 100 each 3  . diclofenac Sodium (VOLTAREN) 1 % GEL Apply 4 g topically 4 (four) times daily.    . DULoxetine (CYMBALTA) 30 MG capsule TAKE 1 CAPSULE BY MOUTH EVERY DAY 90 capsule 1  . enalapril (VASOTEC) 10 MG tablet Take 1 tablet (10 mg total) by mouth daily. 90 tablet 3  . esomeprazole (NEXIUM) 40 MG capsule Take 1 capsule (40 mg total) by mouth daily. 30 capsule 5  . furosemide (LASIX) 40 MG tablet Take 1 tablet (40 mg total) by mouth  daily. 90 tablet 0  . glucose blood (ACCU-CHEK GUIDE) test strip Check blood sugar1 time per day 100 each 3  . levothyroxine (SYNTHROID) 88 MCG tablet TAKE 1 TABLET BY MOUTH EVERY DAY BEFORE BREAKFAST 30 tablet 11  . lidocaine (LIDODERM) 5 % SMARTSIG:1 Patch(s) Topical Every 12 Hours    . metFORMIN (GLUCOPHAGE-XR) 500 MG 24 hr tablet Take 2 tablets (1,000 mg total) by mouth 2 (two) times daily. 120 tablet 5  . multivitamin-iron-minerals-folic acid (CENTRUM) chewable tablet Chew 1 tablet by mouth daily. 90 tablet 3  . pravastatin (PRAVACHOL) 10 MG tablet TAKE 1 TABLET BY MOUTH EVERY DAY IN THE EVENING 90 tablet 3  . traMADol (ULTRAM) 50 MG tablet Take 1 tablet (50 mg total) by mouth daily. 30 tablet 1  . vitamin B-12 (CYANOCOBALAMIN) 1000 MCG tablet Take 1 tablet (1,000 mcg total) by mouth daily. 90 tablet 3   No current facility-administered medications on file prior to visit.    Allergies  Allergen Reactions  . Codeine Sulfate Other (See Comments)    Confusion, anxiety, fatigue  . Meperidine And Related Nausea And Vomiting  . Minocycline Hcl Nausea And Vomiting    Hospitalized for vomiting along with allergic reaction  . Penicillins Other (See Comments)    Has patient had a PCN reaction causing immediate rash, facial/tongue/throat swelling, SOB or lightheadedness with hypotension: Yes Has patient had a PCN reaction causing severe rash involving mucus membranes or skin necrosis: Yes Has patient had a PCN reaction that  required hospitalization Yes Has patient had a PCN reaction occurring within the last 10 years: Yes If all of the above answers are "NO", then may proceed with Cephalosporin use.   Marland Kitchen Propoxyphene N-Acetaminophen Other (See Comments)    confused  . Tetracycline Hcl Other (See Comments)    Vomiting Note: Patient took a course of doxycycline in June 2014 without any problems.    Objective:  General: Alert and oriented x3 in no acute distress  Dermatology: No open  lesions bilateral lower extremities, no webspace macerations, no ecchymosis bilateral, all nails x 10 are well manicured.  Vascular: Dorsalis Pedis and Posterior Tibial pedal pulses nonpalpable, Capillary Fill Time 5 seconds,(-) pedal hair growth bilateral, trace edema bilateral lower extremities, trophic skin changes noted bilateral, temperature gradient within normal limits.  Neurology: Michaell Cowing sensation intact via light touch bilateral, subjective numbness tingling and burning bilateral patient is hypersensitive to vibration and 2 cm Weinstein monofilament bilateral.  Musculoskeletal: Subjective restless leg syndrome worse when at rest inability to keep legs still. Strength within normal limits in all groups bilateral.   Assessment and Plan: Problem List Items Addressed This Visit      Cardiovascular and Mediastinum   Chronic venous insufficiency (Chronic)     Endocrine   Diabetes mellitus type 2, uncontrolled (HCC) (Chronic)     Other   B12 deficiency (Chronic)    Other Visit Diagnoses    Neuritis    -  Primary   Relevant Orders   CBC with Differential/Platelet   Basic Metabolic Panel   Thyroid Panel With TSH   Vitamin B12   Magnesium   Restless leg syndrome       Foot pain, bilateral           -Complete examination performed -Discussed treatement options for likely neuritis secondary to history of diabetes versus underlying metabolic condition since she is noted to have very low B12 and thyroid issues as well -Rx Requip to take as instructed for restless leg since this is worked well in the past for her -Ordered blood work for further evaluation since patient has not had a history and physical for the year and has not establish care with a PCP if blood work is abnormal we will call patient to let her know the results and to have her to follow-up as soon as possible with a primary care doctor -Patient to return to office as needed or sooner if condition worsens.  Asencion Islam, DPM

## 2020-09-20 ENCOUNTER — Telehealth: Payer: Self-pay | Admitting: Sports Medicine

## 2020-09-20 NOTE — Telephone Encounter (Signed)
Will you let the patient know that there are no other alternatives that I am aware of that I can give. She should ask her PCP to see if they have other recommendations Thanks Dr. Marylene Land

## 2020-09-20 NOTE — Telephone Encounter (Signed)
Patient called and stated medication ( ropinirole)  is making her sick. She did state she has stop taking the medication and is wondering if you can call her in something else

## 2020-09-21 NOTE — Telephone Encounter (Signed)
Thanks

## 2020-09-21 NOTE — Telephone Encounter (Signed)
Spoke to patient

## 2020-09-25 ENCOUNTER — Other Ambulatory Visit: Payer: Self-pay | Admitting: Sports Medicine

## 2020-09-29 NOTE — Telephone Encounter (Signed)
Please advise 

## 2020-10-07 ENCOUNTER — Encounter: Payer: Self-pay | Admitting: Internal Medicine

## 2020-10-07 ENCOUNTER — Other Ambulatory Visit: Payer: Self-pay

## 2020-10-07 ENCOUNTER — Other Ambulatory Visit: Payer: Self-pay | Admitting: Internal Medicine

## 2020-10-07 ENCOUNTER — Ambulatory Visit (INDEPENDENT_AMBULATORY_CARE_PROVIDER_SITE_OTHER): Payer: Medicare Other | Admitting: Internal Medicine

## 2020-10-07 DIAGNOSIS — R110 Nausea: Secondary | ICD-10-CM | POA: Diagnosis not present

## 2020-10-07 MED ORDER — ONDANSETRON HCL 4 MG PO TABS: 4.0000 mg | ORAL_TABLET | Freq: Three times a day (TID) | ORAL | 0 refills | Status: DC | PRN

## 2020-10-07 NOTE — Assessment & Plan Note (Signed)
This is a telehealth visit for nausea.  Patient has a history per problem list of nausea.  This episode of nausea began 2 days ago and she has not had any other symptoms. No one around her has been sick .  Reviewed chart notes and appears she had nausea to ropinirole earlier this month. She has not been taking this medication since.  Given she is a diabetic ask her to check her blood sugar and it was 220.  She reports she just has eaten some applesauce. Her diabetes has been controlled with a hemoglobin A1c of 7.4 per chart review.  She is also on Lasix and given she has had poor p.o. intake ask her to hold this medication for a few days.  She reports she is already held the medication today.  She has history of anxiety and believes this is what is causing her stomach discomfort.  She reports she has lost someone close to her recently. She is not taking her Cymbalta.   1. Nausea: We will give patient 10 tablets of Zofran to help her with this acute nausea.  I recommended for her to be seen in clinic if she does not feel better in a few days.  Plan: - ondansetron (ZOFRAN) 4 MG tablet; Take 1 tablet (4 mg total) by mouth every 8 (eight) hours as needed for nausea or vomiting.  Dispense: 10 tablet; Refill: 0

## 2020-10-07 NOTE — Progress Notes (Signed)
  [facility] Internal Medicine Residency Telephone Encounter Continuity Care Appointment  HPI:   This telephone encounter was created for [name] on 10/07/2020 for the following purpose/cc nausea.   Past Medical History:  Past Medical History:  Diagnosis Date  . Chronic interstitial cystitis    Followed by Dr. Marcine Matar  . COPD (chronic obstructive pulmonary disease) (HCC)   . DDD (degenerative disc disease), lumbar   . Degeneration of cervical intervertebral disc   . Depression   . Diabetes mellitus, type 2 (HCC) 2007  . Heme positive stool    EGD on 04/08/2010 by Dr. Ewing Schlein showed chronic gastritis and a few gastric polyps; pathology showed a fundic gland polyp.  Colonoscopy on 04/08/2010 showed small external and internal hemorrhoids, and a few benign-appearing diminutive polyps in the rectum, the distal sigmoid colon, and in the distal descending colon; pathology showed hyperplastic polyps.   . Hypertension   . Hypothyroidism   . Iron deficiency anemia   . Spinal stenosis       ROS:  Review of Systems  Constitutional: Negative for chills and fever.  Gastrointestinal: Positive for nausea. Negative for abdominal pain, diarrhea and vomiting.     Assessment / Plan / Recommendations:   Please see A&P under problem oriented charting for assessment of the patient's acute and chronic medical conditions.   As always, pt is advised that if symptoms worsen or new symptoms arise, they should go to an urgent care facility or to to ER for further evaluation.   Consent and Medical Decision Making:   Patient discussed with Dr. Antony Contras  This is a telephone encounter between Mary Gray and Milus Banister on 10/07/2020 for nausea The visit was conducted with the patient located at home and Milus Banister at [facility]. The patient's identity was confirmed using their DOB and current address. The patient has consented to being evaluated through a telephone encounter and  understands the associated risks (an examination cannot be done and the patient may need to come in for an appointment) / benefits (allows the patient to remain at home, decreasing exposure to coronavirus). I personally spent 21 minutes on medical discussion.

## 2020-10-07 NOTE — Progress Notes (Signed)
Internal Medicine Clinic Attending  Case discussed with Dr. Steen  At the time of the visit.  We reviewed the resident's history and exam and pertinent patient test results.  I agree with the assessment, diagnosis, and plan of care documented in the resident's note.  

## 2020-10-08 ENCOUNTER — Encounter: Payer: Self-pay | Admitting: Internal Medicine

## 2020-10-08 ENCOUNTER — Ambulatory Visit (INDEPENDENT_AMBULATORY_CARE_PROVIDER_SITE_OTHER): Payer: Medicare Other | Admitting: Internal Medicine

## 2020-10-08 DIAGNOSIS — F411 Generalized anxiety disorder: Secondary | ICD-10-CM

## 2020-10-08 MED ORDER — CITALOPRAM HYDROBROMIDE 10 MG PO TABS: 10.0000 mg | ORAL_TABLET | Freq: Every day | ORAL | 2 refills | Status: DC

## 2020-10-08 MED ORDER — HYDROXYZINE HCL 25 MG PO TABS: 25.0000 mg | ORAL_TABLET | Freq: Four times a day (QID) | ORAL | 0 refills | Status: DC | PRN

## 2020-10-08 NOTE — Progress Notes (Signed)
  [facility] Internal Medicine Residency Telephone Encounter Continuity Care Appointment  HPI:   This telephone encounter was created for [name] on 10/08/2020 for the following purpose/cc generalized anxiety disorder.   Past Medical History:  Past Medical History:  Diagnosis Date  . Chronic interstitial cystitis    Followed by Dr. Marcine Matar  . COPD (chronic obstructive pulmonary disease) (HCC)   . DDD (degenerative disc disease), lumbar   . Degeneration of cervical intervertebral disc   . Depression   . Diabetes mellitus, type 2 (HCC) 2007  . Heme positive stool    EGD on 04/08/2010 by Dr. Ewing Schlein showed chronic gastritis and a few gastric polyps; pathology showed a fundic gland polyp.  Colonoscopy on 04/08/2010 showed small external and internal hemorrhoids, and a few benign-appearing diminutive polyps in the rectum, the distal sigmoid colon, and in the distal descending colon; pathology showed hyperplastic polyps.   . Hypertension   . Hypothyroidism   . Iron deficiency anemia   . Spinal stenosis       ROS:  Review of Systems  Constitutional: Negative for chills and fever.  Gastrointestinal: Positive for diarrhea. Negative for blood in stool, melena and nausea.  Psychiatric/Behavioral: Positive for depression. Negative for suicidal ideas. The patient is nervous/anxious.        Assessment / Plan / Recommendations:   Please see A&P under problem oriented charting for assessment of the patient's acute and chronic medical conditions.   As always, pt is advised that if symptoms worsen or new symptoms arise, they should go to an urgent care facility or to to ER for further evaluation.   Consent and Medical Decision Making:   Patient discussed with Dr. Oswaldo Done  This is a telephone encounter between Mary Gray and Milus Banister on 10/08/2020 for generalized anxiety disorder. The visit was conducted with the patient located at home and Milus Banister at [facility]  The patient's identity was confirmed using their DOB and current address. The patient has consented to being evaluated through a telephone encounter and understands the associated risks (an examination cannot be done and the patient may need to come in for an appointment) / benefits (allows the patient to remain at home, decreasing exposure to coronavirus). I personally spent 22 minutes on medical discussion.

## 2020-10-08 NOTE — Telephone Encounter (Signed)
Pt is calling regarding medicine [phone]

## 2020-10-08 NOTE — Progress Notes (Signed)
Internal Medicine Clinic Attending  Case discussed with Dr. Steen  At the time of the visit.  We reviewed the resident's history and pertinent patient test results.  I agree with the assessment, diagnosis, and plan of care documented in the resident's note.  

## 2020-10-08 NOTE — Addendum Note (Signed)
Addended by: Erlinda Hong T on: 10/08/2020 04:04 PM   Modules accepted: Level of Service

## 2020-10-08 NOTE — Assessment & Plan Note (Signed)
This is a telehealth visit for generalized anxiety disorder.  Patient called yesterday and reported she was having nausea and prescribe Zofran.  Her nausea has improved but she continues to be very upset and depressed.  She says she feels this nausea and diarrhea are systemic effects from her anxiety.  She request alprazolam but her PCP has avoided this medication due to history of diversion and poor follow-up. Acutely her anxiety is worsened since she had some close friends who died.    She was tried on Cymbalta but stopped this medication.  She also recently stopped taking ropinirole for restless leg due to side effect of nausea.  She is currently prescribed tramadol for chronic spinal stenosis of lumbar region.    Assessment: Uncontrolled anxiety with depression Plan: -Citalopram 10 mg daily -Hydroxyzine 25 mg every 6 hours for anxiety, discussed trying this for a month but can likely stop if Citalopram is helping -Given instructions to stop taking Zofran to avoid possible serotonin syndrome with taking Zofran, citalopram and tramadol

## 2020-10-13 ENCOUNTER — Ambulatory Visit (INDEPENDENT_AMBULATORY_CARE_PROVIDER_SITE_OTHER): Payer: Medicare Other | Admitting: Student

## 2020-10-13 ENCOUNTER — Other Ambulatory Visit: Payer: Self-pay

## 2020-10-13 DIAGNOSIS — H9201 Otalgia, right ear: Secondary | ICD-10-CM | POA: Diagnosis not present

## 2020-10-13 MED ORDER — CEFDINIR 300 MG PO CAPS: 300.0000 mg | ORAL_CAPSULE | Freq: Two times a day (BID) | ORAL | 0 refills | Status: DC

## 2020-10-13 NOTE — Progress Notes (Signed)
  [facility] Internal Medicine Residency Telephone Encounter Continuity Care Appointment  HPI:   This telephone encounter was created for [name] on 10/13/2020 for the following purpose/cc: right ear pain.  Reports having right ear pain for past two days that is not going away. States that it hurts on the outside of her ear and the inside of her ear. Reports that she was standing outside in the wind and that worsened her ear pain. Denies any trauma, ear discharge/drainage, redness, and swelling in or around right ear. She does endorse pain with pressing on the right tragus and also endorses dental pain with chewing. Denies recent dental procedures. Reports that she had a history of inner ear infection about 2 years ago that felt similar to this and she was given antibiotics which helped.   Advised patient that she will need to come in for an in-person visit so that we can examine her ear to determine the etiology of her ear pain. Unfortunately, the earliest that this can happen is this next Monday (10/18/2020). Therefore, I will prescribe cefdinir 300mg  BID for 5 days (patient is allergic to penicillins) at this time as I am unable to distinguish between otitis externa and otitis media from the patient's description alone. I will have the patient come in on Monday to evaluate her ear in person.   Past Medical History:  Past Medical History:  Diagnosis Date  . Chronic interstitial cystitis    Followed by Dr. Tuesday  . COPD (chronic obstructive pulmonary disease) (HCC)   . DDD (degenerative disc disease), lumbar   . Degeneration of cervical intervertebral disc   . Depression   . Diabetes mellitus, type 2 (HCC) 2007  . Heme positive stool    EGD on 04/08/2010 by Dr. 04/10/2010 showed chronic gastritis and a few gastric polyps; pathology showed a fundic gland polyp.  Colonoscopy on 04/08/2010 showed small external and internal hemorrhoids, and a few benign-appearing diminutive  polyps in the rectum, the distal sigmoid colon, and in the distal descending colon; pathology showed hyperplastic polyps.   . Hypertension   . Hypothyroidism   . Iron deficiency anemia   . Spinal stenosis       ROS:   Please see HPI   Assessment / Plan / Recommendations:   Please see A&P under problem oriented charting for assessment of the patient's acute and chronic medical conditions.   As always, pt is advised that if symptoms worsen or new symptoms arise, they should go to an urgent care facility or to to ER for further evaluation.   Consent and Medical Decision Making:   Patient seen with Dr. 04/10/2010  This is a telephone encounter between Mary Gray and Mary Gray on 10/13/2020 for right ear pain. The visit was conducted with the patient located at home and 10/15/2020 at [facility]. The patient's identity was confirmed using their DOB and current address. The patient has consented to being evaluated through a telephone encounter and understands the associated risks (an examination cannot be done and the patient may need to come in for an appointment) / benefits (allows the patient to remain at home, decreasing exposure to coronavirus). I personally spent 18 minutes on medical discussion.

## 2020-10-13 NOTE — Assessment & Plan Note (Signed)
Spoke with patient via telephone. Reports having right ear pain for past two days that is not going away. States that it hurts on the outside of her ear and the inside of her ear. Reports that she was standing outside in the wind and that worsened her ear pain. Denies any trauma, ear discharge/drainage, redness, and swelling in or around right ear. She does endorse pain with pressing on the right tragus and also endorses dental pain with chewing. Denies recent dental procedures. Reports that she had a history of inner ear infection about 2 years ago that felt similar to this and she was given antibiotics which helped.   Advised patient that she will need to come in for an in-person visit so that we can examine her ear to determine the etiology of her ear pain. Unfortunately, the earliest that this can happen is this next Monday (10/18/2020). Therefore, I will prescribe cefdinir 300mg  BID for 5 days (patient is allergic to penicillins) at this time as I am unable to distinguish between otitis externa and otitis media from the patient's description alone. I will have the patient come in on Monday to evaluate her ear in person.  Plan: -cefdinir 300mg  BID for 5 days -f/u in clinic for in-person visit on Monday (10/18/2020)

## 2020-10-15 ENCOUNTER — Encounter (HOSPITAL_COMMUNITY): Payer: Self-pay

## 2020-10-15 ENCOUNTER — Telehealth: Payer: Self-pay | Admitting: Internal Medicine

## 2020-10-15 ENCOUNTER — Other Ambulatory Visit: Payer: Self-pay

## 2020-10-15 ENCOUNTER — Ambulatory Visit (HOSPITAL_COMMUNITY)
Admission: EM | Admit: 2020-10-15 | Discharge: 2020-10-15 | Disposition: A | Discharge status: Home / Self Care | Payer: Medicare Other | Attending: Family Medicine | Admitting: Family Medicine

## 2020-10-15 DIAGNOSIS — H9203 Otalgia, bilateral: Secondary | ICD-10-CM | POA: Diagnosis not present

## 2020-10-15 DIAGNOSIS — S39012A Strain of muscle, fascia and tendon of lower back, initial encounter: Secondary | ICD-10-CM

## 2020-10-15 DIAGNOSIS — M25561 Pain in right knee: Secondary | ICD-10-CM

## 2020-10-15 MED ORDER — NEOMYCIN-POLYMYXIN-HC 3.5-10000-1 OT SUSP
4.0000 [drp] | Freq: Three times a day (TID) | OTIC | 0 refills | Status: DC
Start: 2020-10-15 — End: 2022-01-20

## 2020-10-15 MED ORDER — TIZANIDINE HCL 4 MG PO TABS: 4.0000 mg | ORAL_TABLET | Freq: Four times a day (QID) | ORAL | 0 refills | Status: DC | PRN

## 2020-10-15 MED ORDER — TRAMADOL-ACETAMINOPHEN 37.5-325 MG PO TABS: 2.0000 | ORAL_TABLET | Freq: Four times a day (QID) | ORAL | 0 refills | Status: DC | PRN

## 2020-10-15 NOTE — ED Triage Notes (Signed)
Patient states she started having right ear pain since Tuesday and it has been worsening. Pt states she now has left ear pain as well but it is not as severe. Pt is having drainage from the right ear as well. Pt is aox4 and ambulatory.

## 2020-10-15 NOTE — Discharge Instructions (Signed)
Use eardrops as directed Take tizanidine (muscle relaxer) as needed.  This is useful at bedtime Try Ultracet for pain.  This is tramadol mixed with Tylenol.  It is a little bit stronger than the plain Tylenol.  It might work better for you.  I hope it helps with your knee and your back

## 2020-10-15 NOTE — Telephone Encounter (Addendum)
  [facility] Internal Medicine Residency Telephone Encounter Continuity Care Appointment  HPI:   This telephone encounter was created for [name] on 10/15/2020 for the following purpose/cc ear pain and headache.    Past Medical History:  Past Medical History:  Diagnosis Date  . Chronic interstitial cystitis    Followed by Dr. Marcine Matar  . COPD (chronic obstructive pulmonary disease) (HCC)   . DDD (degenerative disc disease), lumbar   . Degeneration of cervical intervertebral disc   . Depression   . Diabetes mellitus, type 2 (HCC) 2007  . Heme positive stool    EGD on 04/08/2010 by Dr. Ewing Schlein showed chronic gastritis and a few gastric polyps; pathology showed a fundic gland polyp.  Colonoscopy on 04/08/2010 showed small external and internal hemorrhoids, and a few benign-appearing diminutive polyps in the rectum, the distal sigmoid colon, and in the distal descending colon; pathology showed hyperplastic polyps.   . Hypertension   . Hypothyroidism   . Iron deficiency anemia   . Spinal stenosis       ROS:   Negative for fevers/chills, dental pain   Assessment / Plan / Recommendations:   Please see A&P under problem oriented charting for assessment of the patient's acute and chronic medical conditions.   As always, pt is advised that if symptoms worsen or new symptoms arise, they should go to an urgent care facility or to to ER for further evaluation.   Consent and Medical Decision Making:  This is a telephone encounter between Mary Gray and Mary Gray on 10/15/2020 for progressive ear pain. The visit was conducted with the patient located at home and Mary Gray at [facility]. The patient's identity was confirmed using their DOB and current address. The patient has consented to being evaluated through a telephone encounter and understands the associated risks (an examination cannot be done and the patient may need to come in for an appointment) / benefits  (allows the patient to remain at home, decreasing exposure to coronavirus). I personally spent 12 minutes on medical discussion.    Mary Gray is calling today with complaints of right ear pain that has progressively worsened since her telephone encounter on 11/24. Patient initially reported right ear pain for two days duration on 11/24 on the outside and inside of ear. Due to concerns for otitis externa/media, patient prescribed Cefdinir 300mg  bid for 5 days and recommended for in-person evaluation on Monday 11/29. Patient is calling today with worsening ear pain that feels like it is throbbing in nature with associated slightly purulent drainage from the right ear. She endorses similar pain on the left ear as well and a headache. She denies any fevers/chills or odontogenic pains. Patient is requesting ear drops. Advised patient that she would need to be evaluated in person for this. Recommended that if she has worsening of symptoms to seek more urgent medical attention sooner than Monday. She expresses understanding.

## 2020-10-15 NOTE — ED Provider Notes (Signed)
[facility]    CSN: [id number] Arrival date & time: 10/15/20  1853      History   Chief Complaint Chief Complaint  Patient presents with  . Otalgia    bilateral worse on right    HPI Mary Gray is a 59 y.o. female.   HPI  Patient has lumbar degenerative disc disease.  She has a flare of her back pain.  Is asking for something for pain.  States tramadol is not working for her.  She has allergies listed to codeine, Darvocet, meperidine/not certain we will likely give her given these drug reactions.  We will try Ultracet since she does tolerate tramadol Patient states she has ear pain.  She gets ear pain every year.  She states is because she had infections as a child.  She called her doctor.  Was given Omnicef.  Has an appointment next week.  Is unhappy she was not offered eardrops.  Is here requesting eardrops. Patient also has right knee pain.  States she has osteoarthritis in her knee.  Gets regular cortisone shots.  Past Medical History:  Diagnosis Date  . Chronic interstitial cystitis    Followed by Dr. Marcine Matar  . COPD (chronic obstructive pulmonary disease) (HCC)   . DDD (degenerative disc disease), lumbar   . Degeneration of cervical intervertebral disc   . Depression   . Diabetes mellitus, type 2 (HCC) 2007  . Heme positive stool    EGD on 04/08/2010 by Dr. Ewing Schlein showed chronic gastritis and a few gastric polyps; pathology showed a fundic gland polyp.  Colonoscopy on 04/08/2010 showed small external and internal hemorrhoids, and a few benign-appearing diminutive polyps in the rectum, the distal sigmoid colon, and in the distal descending colon; pathology showed hyperplastic polyps.   . Hypertension   . Hypothyroidism   . Iron deficiency anemia   . Spinal stenosis     Patient Active Problem List   Diagnosis Date Noted  . Right ear pain 10/13/2020  . Nausea 02/10/2020  . B12 deficiency 11/25/2018  . Urinary, incontinence, stress female  09/09/2018  . Unilateral primary osteoarthritis, right knee 05/01/2018  . Chronic venous insufficiency 02/26/2017  . Candidal skin infection 03/03/2014  . Preventative health care 05/14/2013  . AVN (avascular necrosis of bone) (HCC) 03/07/2013  . Spinal stenosis of lumbar region 03/01/2011  . Hypothyroidism 07/06/2006  . Diabetes mellitus type 2, uncontrolled (HCC) 07/06/2006  . Hyperlipemia 07/06/2006  . Generalized anxiety disorder 07/06/2006  . Essential hypertension 07/06/2006  . GERD 07/06/2006    Past Surgical History:  Procedure Laterality Date  . CARPAL TUNNEL RELEASE  05/08/2000   By Dr. Katy Fitch. Sypher, Montez Hageman.    OB History   No obstetric history on file.      Home Medications    Prior to Admission medications   Medication Sig Start Date End Date Taking? Authorizing Provider  Accu-Chek Softclix Lancets lancets Check blood sugar 1 time per day 07/12/20  Yes Tyson Alias, MD  cefdinir (OMNICEF) 300 MG capsule Take 1 capsule (300 mg total) by mouth 2 (two) times daily. 10/13/20  Yes Merrilyn Puma, MD  citalopram (CELEXA) 10 MG tablet Take 1 tablet (10 mg total) by mouth daily. 10/08/20 10/08/21 Yes Albertha Ghee, MD  diclofenac Sodium (VOLTAREN) 1 % GEL Apply 4 g topically 4 (four) times daily. 07/29/20  Yes [provider]  enalapril (VASOTEC) 10 MG tablet Take 1 tablet (10 mg total) by mouth daily. 06/14/20  Yes  Tyson AliasVincent, Duncan Thomas, MD  esomeprazole (NEXIUM) 40 MG capsule Take 1 capsule (40 mg total) by mouth daily. 08/11/20  Yes Tyson AliasVincent, Duncan Thomas, MD  glucose blood (ACCU-CHEK GUIDE) test strip Check blood sugar1 time per day 07/12/20  Yes Tyson AliasVincent, Duncan Thomas, MD  hydrOXYzine (ATARAX/VISTARIL) 25 MG tablet Take 1 tablet (25 mg total) by mouth every 6 (six) hours as needed for anxiety. 10/08/20  Yes Albertha GheeSteen, James, MD  levothyroxine (SYNTHROID) 88 MCG tablet TAKE 1 TABLET BY MOUTH EVERY DAY BEFORE BREAKFAST 06/09/20  Yes Gust RungHoffman, Erik C, DO  lidocaine  (LIDODERM) 5 % SMARTSIG:1 Patch(s) Topical Every 12 Hours 07/23/20  Yes [provider]  metFORMIN (GLUCOPHAGE-XR) 500 MG 24 hr tablet Take 2 tablets (1,000 mg total) by mouth 2 (two) times daily. 06/14/20  Yes Tyson AliasVincent, Duncan Thomas, MD  multivitamin-iron-minerals-folic acid (CENTRUM) chewable tablet Chew 1 tablet by mouth daily. 07/12/20  Yes Tyson AliasVincent, Duncan Thomas, MD  ondansetron (ZOFRAN) 4 MG tablet Take 1 tablet (4 mg total) by mouth every 8 (eight) hours as needed for nausea or vomiting. 10/07/20 10/07/21 Yes Albertha GheeSteen, James, MD  traMADol Janean Sark(ULTRAM) 50 MG tablet TAKE 1 TABLET BY MOUTH DAILY 10/08/20  Yes Tyson AliasVincent, Duncan Thomas, MD  vitamin B-12 (CYANOCOBALAMIN) 1000 MCG tablet Take 1 tablet (1,000 mcg total) by mouth daily. 07/12/20  Yes Tyson AliasVincent, Duncan Thomas, MD  neomycin-polymyxin-hydrocortisone (CORTISPORIN) 3.5-10000-1 OTIC suspension Place 4 drops into both ears 3 (three) times daily. 10/15/20   Eustace MooreNelson, Hodges Treiber Sue, MD  tiZANidine (ZANAFLEX) 4 MG tablet Take 1-2 tablets (4-8 mg total) by mouth every 6 (six) hours as needed for muscle spasms. 10/15/20   Eustace MooreNelson, Ragna Kramlich Sue, MD  traMADol-acetaminophen (ULTRACET) 37.5-325 MG tablet Take 2 tablets by mouth every 6 (six) hours as needed. 10/15/20   Eustace MooreNelson, Markesha Hannig Sue, MD  furosemide (LASIX) 40 MG tablet Take 1 tablet (40 mg total) by mouth daily. 08/24/20 10/15/20  Tyson AliasVincent, Duncan Thomas, MD  pravastatin (PRAVACHOL) 10 MG tablet TAKE 1 TABLET BY MOUTH EVERY DAY IN THE EVENING 01/16/19 10/15/20  Tyson AliasVincent, Duncan Thomas, MD    Family History Family History  Problem Relation Age of Onset  . Heart attack Father 8462  . Diabetes Mother   . Hypertension Mother   . Diabetes Sister   . Breast cancer Neg Hx   . Colon cancer Neg Hx     Social History Social History   Tobacco Use  . Smoking status: Current Every Day Smoker    Packs/day: 0.50    Years: 15.00    Pack years: 7.50    Types: Cigarettes    Last attempt to quit: 01/18/1996    Years  since quitting: 24.7  . Smokeless tobacco: Never Used  . Tobacco comment: 5-6 per day  Vaping Use  . Vaping Use: Never used  Substance Use Topics  . Alcohol use: No    Alcohol/week: 0.0 standard drinks  . Drug use: No     Allergies   Codeine sulfate, Meperidine and related, Minocycline hcl, Penicillins, Propoxyphene n-acetaminophen, and Tetracycline hcl   Review of Systems Review of Systems See HPI  Physical Exam Triage Vital Signs ED Triage Vitals  Enc Vitals Group     BP 10/15/20 1904 (!) 151/64     Pulse Rate 10/15/20 1904 98     Resp 10/15/20 1904 18     Temp 10/15/20 1904 98.2 F (36.8 C)     Temp Source 10/15/20 1904 Oral     SpO2 10/15/20 1904 99 %  Weight --      Height --      Head Circumference --      Peak Flow --      Pain Score 10/15/20 1907 10     Pain Loc --      Pain Edu? --      Excl. in GC? --    No data found.  Updated Vital Signs BP (!) 151/64 (BP Location: Right Arm)   Pulse 98   Temp 98.2 F (36.8 C) (Oral)   Resp 18   LMP 01/17/2009   SpO2 99%     Physical Exam Constitutional:      General: She is not in acute distress.    Appearance: She is well-developed.     Comments: Overweight  HENT:     Head: Normocephalic and atraumatic.     Ears:     Comments: TMs are scarred.  Canals are clear.  No evidence of fluid or infection    Mouth/Throat:     Comments: Mask in place Eyes:     Conjunctiva/sclera: Conjunctivae normal.     Pupils: Pupils are equal, round, and reactive to light.  Cardiovascular:     Rate and Rhythm: Normal rate.  Pulmonary:     Effort: Pulmonary effort is normal. No respiratory distress.  Abdominal:     General: There is no distension.     Palpations: Abdomen is soft.  Musculoskeletal:        General: Normal range of motion.     Cervical back: Normal range of motion.     Comments: Mild tenderness low back.  Mild tenderness right knee.  Knee has warmth and crepitus.  Skin:    General: Skin is warm and  dry.  Neurological:     Mental Status: She is alert.     Gait: Gait abnormal.     Comments: Antalgic gait      UC Treatments / Results  Labs (all labs ordered are listed, but only abnormal results are displayed) Labs Reviewed - No data to display  EKG   Radiology No results found.  Procedures Procedures (including critical care time)  Medications Ordered in UC Medications - No data to display  Initial Impression / Assessment and Plan / UC Course  I have reviewed the triage vital signs and the nursing notes.  Pertinent labs & imaging results that were available during my care of the patient were reviewed by me and considered in my medical decision making (see chart for details).     Patient is asking for eardrops and help with management of her chronic problems.  We will give her muscle relaxant to try.  Ultracet instead of tramadol.  Eardrops at her request although her ears look normal.  Follow-up with internal medicine Final Clinical Impressions(s) / UC Diagnoses   Final diagnoses:  Otalgia of both ears  Acute pain of right knee  Lumbar strain, initial encounter     Discharge Instructions     Use eardrops as directed Take tizanidine (muscle relaxer) as needed.  This is useful at bedtime Try Ultracet for pain.  This is tramadol mixed with Tylenol.  It is a little bit stronger than the plain Tylenol.  It might work better for you.  I hope it helps with your knee and your back   ED Prescriptions    Medication Sig Dispense Auth. Provider   neomycin-polymyxin-hydrocortisone (CORTISPORIN) 3.5-10000-1 OTIC suspension Place 4 drops into both ears 3 (three) times daily. 10 mL  Eustace Moore, MD   tiZANidine (ZANAFLEX) 4 MG tablet Take 1-2 tablets (4-8 mg total) by mouth every 6 (six) hours as needed for muscle spasms. 21 tablet Eustace Moore, MD   traMADol-acetaminophen (ULTRACET) 37.5-325 MG tablet Take 2 tablets by mouth every 6 (six) hours as needed. 20  tablet Eustace Moore, MD     I have reviewed the PDMP during this encounter.   Eustace Moore, MD 10/15/20 [phone]

## 2020-10-16 NOTE — Progress Notes (Signed)
Internal Medicine Clinic Attending  I evaluated the patient.  I personally confirmed the key portions of the history and exam documented by Dr. Jinwala and I reviewed pertinent patient test results.  The assessment, diagnosis, and plan were formulated together and I agree with the documentation in the resident's note.  

## 2020-10-18 ENCOUNTER — Telehealth: Payer: Self-pay

## 2020-10-18 ENCOUNTER — Encounter: Payer: Self-pay | Admitting: Student in an Organized Health Care Education/Training Program

## 2020-10-18 NOTE — Telephone Encounter (Signed)
Pls contact pt [phone], she missed to calls

## 2020-10-27 ENCOUNTER — Other Ambulatory Visit: Payer: Self-pay | Admitting: Student in an Organized Health Care Education/Training Program

## 2020-10-27 ENCOUNTER — Other Ambulatory Visit: Payer: Self-pay | Admitting: Internal Medicine

## 2020-10-27 ENCOUNTER — Other Ambulatory Visit: Payer: Self-pay | Admitting: Sports Medicine

## 2020-10-27 ENCOUNTER — Encounter: Payer: Self-pay | Admitting: Internal Medicine

## 2020-10-27 ENCOUNTER — Other Ambulatory Visit (HOSPITAL_COMMUNITY)
Admission: RE | Admit: 2020-10-27 | Discharge: 2020-10-27 | Disposition: A | Discharge status: Home / Self Care | Payer: Medicare Other | Source: Ambulatory Visit | Attending: Student in an Organized Health Care Education/Training Program | Admitting: Student in an Organized Health Care Education/Training Program

## 2020-10-27 ENCOUNTER — Ambulatory Visit (INDEPENDENT_AMBULATORY_CARE_PROVIDER_SITE_OTHER): Payer: Medicare Other | Admitting: Internal Medicine

## 2020-10-27 VITALS — BP 124/92 | HR 88 | Temp 98.7°F | Ht 61.0 in | Wt 190.7 lb

## 2020-10-27 DIAGNOSIS — E1165 Type 2 diabetes mellitus with hyperglycemia: Secondary | ICD-10-CM

## 2020-10-27 DIAGNOSIS — M48061 Spinal stenosis, lumbar region without neurogenic claudication: Secondary | ICD-10-CM | POA: Diagnosis not present

## 2020-10-27 DIAGNOSIS — F411 Generalized anxiety disorder: Secondary | ICD-10-CM

## 2020-10-27 DIAGNOSIS — N760 Acute vaginitis: Secondary | ICD-10-CM | POA: Insufficient documentation

## 2020-10-27 DIAGNOSIS — M62830 Muscle spasm of back: Secondary | ICD-10-CM

## 2020-10-27 DIAGNOSIS — I10 Essential (primary) hypertension: Secondary | ICD-10-CM | POA: Diagnosis not present

## 2020-10-27 LAB — POCT GLYCOSYLATED HEMOGLOBIN (HGB A1C): Hemoglobin A1C: 6.2 % — AB (ref 4.0–5.6)

## 2020-10-27 LAB — GLUCOSE, CAPILLARY: Glucose-Capillary: 161 mg/dL — ABNORMAL HIGH (ref 70–99)

## 2020-10-27 MED ORDER — TIZANIDINE HCL 4 MG PO TABS: 4.0000 mg | ORAL_TABLET | Freq: Four times a day (QID) | ORAL | 0 refills | Status: DC | PRN

## 2020-10-27 MED ORDER — FLUCONAZOLE 100 MG PO TABS: 100.0000 mg | ORAL_TABLET | Freq: Every day | ORAL | 0 refills | Status: AC

## 2020-10-27 NOTE — Progress Notes (Signed)
   CC: Burning with urination  HPI:  [name] is a 59 y.o. with medical history significant for type 2 diabetes mellitus that is well controlled presenting for evaluation of burning with urination.  Please see problem based charting for further details.  Past Medical History:  Diagnosis Date  . Chronic interstitial cystitis    Followed by Dr. Marcine Matar  . COPD (chronic obstructive pulmonary disease) (HCC)   . DDD (degenerative disc disease), lumbar   . Degeneration of cervical intervertebral disc   . Depression   . Diabetes mellitus, type 2 (HCC) 2007  . Heme positive stool    EGD on 04/08/2010 by Dr. Ewing Schlein showed chronic gastritis and a few gastric polyps; pathology showed a fundic gland polyp.  Colonoscopy on 04/08/2010 showed small external and internal hemorrhoids, and a few benign-appearing diminutive polyps in the rectum, the distal sigmoid colon, and in the distal descending colon; pathology showed hyperplastic polyps.   . Hypertension   . Hypothyroidism   . Iron deficiency anemia   . Spinal stenosis    Review of Systems:  AS per HPI  Physical Exam:  Vitals:   10/27/20 1415  BP: (!) 124/92  Pulse: 88  Temp: 98.7 F (37.1 C)  TempSrc: Oral  SpO2: 97%  Weight: 190 lb 11.2 oz (86.5 kg)  Height: 5\' 1"  (1.549 m)   Physical Exam Vitals reviewed.  Genitourinary:    Pubic Area: No rash.      Labia:        Right: No rash, tenderness or lesion.        Left: No rash, tenderness or lesion.      Vagina: Vaginal discharge (White) present. No tenderness or lesions.     Cervix: Discharge (White) present. No erythema.  Neurological:     Mental Status: She is alert.  Psychiatric:        Mood and Affect: Mood normal.        Behavior: Behavior normal.     Assessment & Plan:   See Encounters Tab for problem based charting.  Patient discussed with Dr. 

## 2020-10-27 NOTE — Telephone Encounter (Signed)
Please advise 

## 2020-10-27 NOTE — Assessment & Plan Note (Addendum)
Vaginitis: Mary Gray has a medical history significant for well-controlled diabetes mellitus, atrophic vaginitis presenting today with a 3-day history of dysuria, urinary urgency.  States that since Sunday the symptoms have been persistent despite trying Vagisil and Monistat.  States her vaginal area itches.  She denies any systemic symptoms such as fevers, chills or discharge  On vaginal exam, there were no obvious rashes around the vulva.  On speculum exam, she did have a thick white vaginal discharge all the way through the vagina and the cervix.  Assessment and plan: Most likely vaginal candidiasis but cannot exclude urinary tract infection.  -Follow-up vaginal cervical ancillary test -Diflucan 150 mg today and repeat in 72 hours   ADDENDUM: I called patient and she states that she still having symptoms in regards to her vaginal candidiasis.  She already took 2 doses of fluconazole 150 mg.  Given that she has not had any relief, I would prescribe fluconazole 150 mg every 72 hours for 2 more doses.  If she has no relief, we could try topical azole daily for 7 to 14 days.   If her symptoms continues, I do wonder if consideration should be given for possible superimposed atrophic vaginitis.

## 2020-10-27 NOTE — Patient Instructions (Addendum)
Ms. Fosco,  It was a pleasure taking care of you in the clinic today.  As we discussed, it appears to you have what looks like a yeast infection.  I have prescribed you a medication called Diflucan.  Please take 1-1/2 pills 150 mg today and repeat dose in 3 days.  I Have also refilled the muscle relaxant.  Take care! Dr. Dortha Schwalbe  Please call the internal medicine center clinic if you have any questions or concerns, we may be able to help and keep you from a long and expensive emergency room wait. Our clinic and after hours phone number is [phone], the best time to call is Monday through Friday 9 am to 4 pm but there is always someone available 24/7 if you have an emergency. If you need medication refills please notify your pharmacy one week in advance and they will send [place] a request.   If you have not gotten the COVID vaccine, I recommend doing so:  You may get it at your local [facility] or [facility] OR To schedule an appointment for a COVID vaccine or be added to the vaccine wait list: Go to [email or web address]   OR Go to [email or web address]                  OR Call [phone]                                     OR Call [phone] and select Option 2

## 2020-10-28 ENCOUNTER — Telehealth: Payer: Self-pay | Admitting: Orthopaedic Surgery

## 2020-10-28 LAB — CERVICOVAGINAL ANCILLARY ONLY
Bacterial Vaginitis (gardnerella): NEGATIVE
Candida Glabrata: NEGATIVE
Candida Vaginitis: POSITIVE — AB
Chlamydia: NEGATIVE
Comment: NEGATIVE
Comment: NEGATIVE
Comment: NEGATIVE
Comment: NEGATIVE
Comment: NEGATIVE
Comment: NORMAL
Neisseria Gonorrhea: NEGATIVE
Trichomonas: NEGATIVE

## 2020-10-28 LAB — MICROSCOPIC EXAMINATION: Casts: NONE SEEN /lpf

## 2020-10-28 LAB — URINALYSIS, ROUTINE W REFLEX MICROSCOPIC
Bilirubin, UA: NEGATIVE
Glucose, UA: NEGATIVE
Ketones, UA: NEGATIVE
Nitrite, UA: NEGATIVE
Protein,UA: NEGATIVE
RBC, UA: NEGATIVE
Specific Gravity, UA: 1.007 (ref 1.005–1.030)
Urobilinogen, Ur: 0.2 mg/dL (ref 0.2–1.0)
pH, UA: 6.5 (ref 5.0–7.5)

## 2020-10-28 NOTE — Telephone Encounter (Signed)
Patient called asked if she can get the gel injection for her right knee. The number to contact patient is [phone]

## 2020-10-28 NOTE — Telephone Encounter (Signed)
Please pre cert visco for the right knee

## 2020-10-28 NOTE — Progress Notes (Signed)
Internal Medicine Clinic Attending  Case discussed with Dr. Agyei  At the time of the visit.  We reviewed the resident's history and exam and pertinent patient test results.  I agree with the assessment, diagnosis, and plan of care documented in the resident's note.  

## 2020-10-29 ENCOUNTER — Telehealth: Payer: Self-pay

## 2020-10-29 ENCOUNTER — Ambulatory Visit (HOSPITAL_COMMUNITY)
Admission: EM | Admit: 2020-10-29 | Discharge: 2020-10-29 | Disposition: A | Discharge status: Home / Self Care | Payer: Medicare Other | Attending: Emergency Medicine | Admitting: Emergency Medicine

## 2020-10-29 ENCOUNTER — Other Ambulatory Visit: Payer: Self-pay

## 2020-10-29 ENCOUNTER — Encounter (HOSPITAL_COMMUNITY): Payer: Self-pay

## 2020-10-29 DIAGNOSIS — M25561 Pain in right knee: Secondary | ICD-10-CM | POA: Diagnosis not present

## 2020-10-29 DIAGNOSIS — G8929 Other chronic pain: Secondary | ICD-10-CM | POA: Diagnosis not present

## 2020-10-29 MED ORDER — KETOROLAC TROMETHAMINE 30 MG/ML IJ SOLN
30.0000 mg | Freq: Once | INTRAMUSCULAR | Status: AC
Start: 2020-10-29 — End: 2020-10-29
  Administered 2020-10-29: 30 mg via INTRAMUSCULAR

## 2020-10-29 MED ORDER — KETOROLAC TROMETHAMINE 30 MG/ML IJ SOLN
INTRAMUSCULAR | Status: AC
  Filled 2020-10-29: qty 1

## 2020-10-29 NOTE — Discharge Instructions (Addendum)
You were given an injection of Toradol.    Rest and elevate your knee.  Apply ice packs 2-3 times a day for up to 20 minutes each.  Wear the knee support and use the crutches as needed..    Follow up with your orthopedist as soon as possible.

## 2020-10-29 NOTE — Telephone Encounter (Signed)
Sent order for gel injection

## 2020-10-29 NOTE — ED Provider Notes (Signed)
[facility]    CSN: [id number] Arrival date & time: 10/29/20  1810      History   Chief Complaint Chief Complaint  Patient presents with  . Leg Pain    HPI Mary Gray is a 59 y.o. female.   Patient presents with ongoing chronic right knee pain x8 months.  She has seen orthopedics for this and had a cortisone injection in her knee; she states she was told she may need surgery.  No new falls or injury.  She states the pain radiates to her hip, 9/10, worse with weightbearing, improves with rest, feels like her "knee is going to give out."  She states she was seen by her PCP 2 days ago and prescribed muscle relaxer.  Her medical history includes COPD, hypertension, diabetes, osteoarthritis, spinal stenosis, DDD, anxiety, depression.  The history is provided by the patient and medical records.    Past Medical History:  Diagnosis Date  . Chronic interstitial cystitis    Followed by Dr. Marcine Matar  . COPD (chronic obstructive pulmonary disease) (HCC)   . DDD (degenerative disc disease), lumbar   . Degeneration of cervical intervertebral disc   . Depression   . Diabetes mellitus, type 2 (HCC) 2007  . Heme positive stool    EGD on 04/08/2010 by Dr. Ewing Schlein showed chronic gastritis and a few gastric polyps; pathology showed a fundic gland polyp.  Colonoscopy on 04/08/2010 showed small external and internal hemorrhoids, and a few benign-appearing diminutive polyps in the rectum, the distal sigmoid colon, and in the distal descending colon; pathology showed hyperplastic polyps.   . Hypertension   . Hypothyroidism   . Iron deficiency anemia   . Spinal stenosis     Patient Active Problem List   Diagnosis Date Noted  . Vaginitis 10/27/2020  . Right ear pain 10/13/2020  . Nausea 02/10/2020  . B12 deficiency 11/25/2018  . Urinary, incontinence, stress female 09/09/2018  . Unilateral primary osteoarthritis, right knee 05/01/2018  . Chronic venous insufficiency  02/26/2017  . Candidal skin infection 03/03/2014  . Preventative health care 05/14/2013  . AVN (avascular necrosis of bone) (HCC) 03/07/2013  . Spinal stenosis of lumbar region 03/01/2011  . Hypothyroidism 07/06/2006  . Diabetes mellitus type 2, uncontrolled (HCC) 07/06/2006  . Hyperlipemia 07/06/2006  . Generalized anxiety disorder 07/06/2006  . Essential hypertension 07/06/2006  . GERD 07/06/2006    Past Surgical History:  Procedure Laterality Date  . CARPAL TUNNEL RELEASE  05/08/2000   By Dr. Katy Fitch. Sypher, Montez Hageman.    OB History   No obstetric history on file.      Home Medications    Prior to Admission medications   Medication Sig Start Date End Date Taking? Authorizing Provider  Accu-Chek Softclix Lancets lancets Check blood sugar 1 time per day 07/12/20   Tyson Alias, MD  cefdinir (OMNICEF) 300 MG capsule Take 1 capsule (300 mg total) by mouth 2 (two) times daily. 10/13/20   Merrilyn Puma, MD  citalopram (CELEXA) 10 MG tablet Take 1 tablet (10 mg total) by mouth daily. 10/08/20 10/08/21  Albertha Ghee, MD  diclofenac Sodium (VOLTAREN) 1 % GEL Apply 4 g topically 4 (four) times daily. 07/29/20   [provider]  enalapril (VASOTEC) 10 MG tablet Take 1 tablet (10 mg total) by mouth daily. 06/14/20   Tyson Alias, MD  enalapril (VASOTEC) 5 MG tablet TAKE 1 TABLET BY MOUTH EVERY DAY 10/28/20   Tyson Alias, MD  esomeprazole (  NEXIUM) 40 MG capsule Take 1 capsule (40 mg total) by mouth daily. 08/11/20   Tyson AliasVincent, Duncan Thomas, MD  fluconazole (DIFLUCAN) 100 MG tablet Take 1 tablet (100 mg total) by mouth daily for 3 days. Take 1 and 1/2 pill today (150 mg) and repeat 1 and 1/2 pill (150 mg) in 3 days 10/27/20 10/30/20  Yvette RackAgyei, Obed K, MD  furosemide (LASIX) 40 MG tablet TAKE 1 TABLET BY MOUTH EVERY DAY 10/28/20   Tyson AliasVincent, Duncan Thomas, MD  glucose blood (ACCU-CHEK GUIDE) test strip Check blood sugar1 time per day 07/12/20   Tyson AliasVincent, Duncan Thomas, MD   hydrOXYzine (ATARAX/VISTARIL) 25 MG tablet TAKE 1 TABLET BY MOUTH EVERY 6 HOURS AS NEEDED FOR ANXIETY. 10/28/20   Tyson AliasVincent, Duncan Thomas, MD  levothyroxine (SYNTHROID) 88 MCG tablet TAKE 1 TABLET BY MOUTH EVERY DAY BEFORE BREAKFAST 06/09/20   Carlynn PurlHoffman, Erik C, DO  lidocaine (LIDODERM) 5 % PLACE 3 PATCHES ONTO THE SKIN EVERY 12 HOURS. REMOVE AND DISCARD WITHIN 12 HRS OR AS DIRECTED BY MD 10/28/20   Tyson AliasVincent, Duncan Thomas, MD  metFORMIN (GLUCOPHAGE-XR) 500 MG 24 hr tablet TAKE 2 TABLETS BY MOUTH TWICE A DAY 10/28/20   Tyson AliasVincent, Duncan Thomas, MD  multivitamin-iron-minerals-folic acid (CENTRUM) chewable tablet Chew 1 tablet by mouth daily. 07/12/20   Tyson AliasVincent, Duncan Thomas, MD  neomycin-polymyxin-hydrocortisone (CORTISPORIN) 3.5-10000-1 OTIC suspension Place 4 drops into both ears 3 (three) times daily. 10/15/20   Eustace MooreNelson, Yvonne Sue, MD  ondansetron (ZOFRAN) 4 MG tablet Take 1 tablet (4 mg total) by mouth every 8 (eight) hours as needed for nausea or vomiting. 10/07/20 10/07/21  Albertha GheeSteen, James, MD  ropinirole (REQUIP) 5 MG tablet TAKE 1 TABLET BY MOUTH EVERYDAY AT BEDTIME 10/27/20   Asencion IslamStover, Titorya, DPM  tiZANidine (ZANAFLEX) 4 MG tablet Take 1-2 tablets (4-8 mg total) by mouth every 6 (six) hours as needed for muscle spasms. 10/27/20   Yvette RackAgyei, Obed K, MD  traMADol (ULTRAM) 50 MG tablet TAKE 1 TABLET BY MOUTH DAILY 10/08/20   Tyson AliasVincent, Duncan Thomas, MD  traMADol-acetaminophen (ULTRACET) 37.5-325 MG tablet Take 2 tablets by mouth every 6 (six) hours as needed. 10/15/20   Eustace MooreNelson, Yvonne Sue, MD  vitamin B-12 (CYANOCOBALAMIN) 1000 MCG tablet Take 1 tablet (1,000 mcg total) by mouth daily. 07/12/20   Tyson AliasVincent, Duncan Thomas, MD  pravastatin (PRAVACHOL) 10 MG tablet TAKE 1 TABLET BY MOUTH EVERY DAY IN THE EVENING 01/16/19 10/15/20  Tyson AliasVincent, Duncan Thomas, MD    Family History Family History  Problem Relation Age of Onset  . Heart attack Father 162  . Diabetes Mother   . Hypertension Mother   . Diabetes Sister   .  Breast cancer Neg Hx   . Colon cancer Neg Hx     Social History Social History   Tobacco Use  . Smoking status: Current Every Day Smoker    Packs/day: 0.50    Years: 15.00    Pack years: 7.50    Types: Cigarettes    Last attempt to quit: 01/18/1996    Years since quitting: 24.7  . Smokeless tobacco: Never Used  . Tobacco comment: 5-6 per day  Vaping Use  . Vaping Use: Never used  Substance Use Topics  . Alcohol use: No    Alcohol/week: 0.0 standard drinks  . Drug use: No     Allergies   Codeine sulfate, Meperidine and related, Minocycline hcl, Penicillins, Propoxyphene n-acetaminophen, and Tetracycline hcl   Review of Systems Review of Systems  Constitutional: Negative for chills and fever.  HENT: Negative for ear pain and sore throat.   Eyes: Negative for pain and visual disturbance.  Respiratory: Negative for cough and shortness of breath.   Cardiovascular: Negative for chest pain and palpitations.  Gastrointestinal: Negative for abdominal pain and vomiting.  Genitourinary: Negative for dysuria and hematuria.  Musculoskeletal: Positive for arthralgias and gait problem. Negative for back pain.  Skin: Negative for color change and rash.  Neurological: Negative for seizures, syncope, weakness and numbness.  All other systems reviewed and are negative.    Physical Exam Triage Vital Signs ED Triage Vitals  Enc Vitals Group     BP 10/29/20 1840 107/67     Pulse Rate 10/29/20 1838 81     Resp 10/29/20 1838 17     Temp 10/29/20 1838 98.4 F (36.9 C)     Temp Source 10/29/20 1838 Oral     SpO2 10/29/20 1838 99 %     Weight --      Height --      Head Circumference --      Peak Flow --      Pain Score 10/29/20 1837 9     Pain Loc --      Pain Edu? --      Excl. in GC? --    No data found.  Updated Vital Signs BP 107/67   Pulse 81   Temp 98.4 F (36.9 C) (Oral)   Resp 17   LMP 01/17/2009   SpO2 99%   Visual Acuity Right Eye Distance:   Left Eye  Distance:   Bilateral Distance:    Right Eye Near:   Left Eye Near:    Bilateral Near:     Physical Exam Vitals and nursing note reviewed.  Constitutional:      General: She is not in acute distress.    Appearance: She is well-developed and well-nourished. She is not ill-appearing.  HENT:     Head: Normocephalic and atraumatic.     Mouth/Throat:     Mouth: Mucous membranes are moist.  Eyes:     Conjunctiva/sclera: Conjunctivae normal.  Cardiovascular:     Rate and Rhythm: Normal rate and regular rhythm.     Heart sounds: Normal heart sounds.  Pulmonary:     Effort: Pulmonary effort is normal. No respiratory distress.     Breath sounds: Normal breath sounds.  Abdominal:     Palpations: Abdomen is soft.     Tenderness: There is no abdominal tenderness.  Musculoskeletal:        General: No swelling, tenderness, deformity, signs of injury or edema. Normal range of motion.     Cervical back: Neck supple.  Skin:    General: Skin is warm and dry.     Capillary Refill: Capillary refill takes less than 2 seconds.     Findings: No bruising, erythema, lesion or rash.  Neurological:     General: No focal deficit present.     Mental Status: She is alert and oriented to person, place, and time.     Sensory: No sensory deficit.     Motor: No weakness.     Gait: Gait abnormal.     Comments: Limping gait.  Psychiatric:        Mood and Affect: Mood and affect and mood normal.        Behavior: Behavior normal.      UC Treatments / Results  Labs (all labs ordered are listed, but only abnormal results are displayed) Labs Reviewed -  No data to display  EKG   Radiology No results found.  Procedures Procedures (including critical care time)  Medications Ordered in UC Medications  ketorolac (TORADOL) 30 MG/ML injection 30 mg (30 mg Intramuscular Given 10/29/20 2016)    Initial Impression / Assessment and Plan / UC Course  I have reviewed the triage vital signs and the  nursing notes.  Pertinent labs & imaging results that were available during my care of the patient were reviewed by me and considered in my medical decision making (see chart for details).   Chronic right knee pain.  Treated with Toradol, rest, elevation, ice packs, knee sleeve, crutches.  Instructed patient to call her orthopedist on Monday to schedule the soonest available appointment.  Patient agrees to plan of care.   Final Clinical Impressions(s) / UC Diagnoses   Final diagnoses:  Chronic pain of right knee     Discharge Instructions     You were given an injection of Toradol.    Rest and elevate your knee.  Apply ice packs 2-3 times a day for up to 20 minutes each.  Wear the knee support and use the crutches as needed..    Follow up with your orthopedist as soon as possible.         ED Prescriptions    None     I have reviewed the PDMP during this encounter.   Mickie Bail, NP 10/29/20 2019

## 2020-10-29 NOTE — Telephone Encounter (Signed)
Right knee gel for [employer]

## 2020-10-29 NOTE — Telephone Encounter (Signed)
Pt states she continues to have problems w/ vaginal yeast and also pain in back, legs, hips, body very bad Please advise

## 2020-10-29 NOTE — ED Triage Notes (Signed)
Pt presents with ongoing right leg & knee pain that radiates up to her right hip for over a week.

## 2020-10-29 NOTE — Telephone Encounter (Signed)
Pt states she still have problem with yeast infection, requesting to speak with a nurse. Please call back.

## 2020-10-30 ENCOUNTER — Other Ambulatory Visit: Payer: Self-pay | Admitting: Internal Medicine

## 2020-10-30 DIAGNOSIS — F411 Generalized anxiety disorder: Secondary | ICD-10-CM

## 2020-11-01 ENCOUNTER — Other Ambulatory Visit: Payer: Self-pay | Admitting: Internal Medicine

## 2020-11-01 ENCOUNTER — Telehealth: Payer: Self-pay

## 2020-11-01 DIAGNOSIS — M48061 Spinal stenosis, lumbar region without neurogenic claudication: Secondary | ICD-10-CM

## 2020-11-01 MED ORDER — FLUCONAZOLE 150 MG PO TABS
150.0000 mg | ORAL_TABLET | ORAL | 0 refills | Status: DC
Start: 2020-11-01 — End: 2020-11-09

## 2020-11-01 NOTE — Telephone Encounter (Signed)
Pt stated that she is still in pain ( r ) leg  Please call pt back ASP  She also stated that the medication for the UTI is not working

## 2020-11-01 NOTE — Addendum Note (Signed)
Addended by: Yvette Rack on: 11/01/2020 10:22 AM   Modules accepted: Orders

## 2020-11-01 NOTE — Progress Notes (Signed)
I called patient and she states that she still having symptoms in regards to her vaginal candidiasis.  She already took 2 doses of fluconazole 150 mg.  Given that she has not had any relief, I would prescribe fluconazole 150 mg every 72 hours for 2 more doses.  If she has no relief, we could try topical azole daily for 7 to 14 days.

## 2020-11-01 NOTE — Telephone Encounter (Signed)
Thanks. I will call patient.

## 2020-11-01 NOTE — Telephone Encounter (Signed)
Pt states that dr Dortha Schwalbe called her with a plan of treatment

## 2020-11-03 ENCOUNTER — Encounter: Payer: Self-pay | Admitting: Internal Medicine

## 2020-11-04 ENCOUNTER — Ambulatory Visit: Payer: Medicare Other | Admitting: Behavioral Health

## 2020-11-04 ENCOUNTER — Other Ambulatory Visit: Payer: Self-pay

## 2020-11-04 DIAGNOSIS — F419 Anxiety disorder, unspecified: Secondary | ICD-10-CM

## 2020-11-04 LAB — HM DIABETES EYE EXAM

## 2020-11-04 NOTE — BH Specialist Note (Signed)
Integrated Behavioral Health via Telemedicine Visit  11/04/2020 [name] [id number]  Number of Integrated Behavioral Health visits: 1/6 Session Start time: 1:00pm  Session End time: 1:50pm Total time: 50   Referring Provider: Dr. Erlinda Hong, MD Patient/Family location: Pt is in the home. BF has left the room to provide privacy. [facility] Provider location: [facility] Office All persons participating in visit: Clinician & Pt Types of Service: Intake/Assessment process  I connected with Mary Gray and/or Mary Gray's self only by Telephone  (Video is Caregility application) and verified that I am speaking with the correct person using two identifiers.Discussed confidentiality: Yes   I discussed the limitations of telemedicine and the availability of in person appointments.  Discussed there is a possibility of technology failure and discussed alternative modes of communication if that failure occurs.  I discussed that engaging in this telemedicine visit, they consent to the provision of behavioral healthcare and the services will be billed under their insurance.  Patient and/or legal guardian expressed understanding and consented to Telemedicine visit: Yes   Presenting Concerns: Patient and/or family reports the following symptoms/concerns: elevated anx/dep & grief issues Duration of problem: Since recent death of 2 Sisters who were her friends & her BF's siblings; Severity of problem: moderate  Patient and/or Family's Strengths/Protective Factors: Social connections and Concrete supports in place (healthy food, safe environments, etc.)  Goals Addressed: Patient will: 1.  Reduce symptoms of: anxiety, depression, stress and grief  2.  Increase knowledge and/or ability of: coping skills, stress reduction and normalize & validate grief rxn  3.  Demonstrate ability to: Increase healthy adjustment to current life circumstances and Begin healthy grieving over loss  Progress towards  Goals: Estb care this day. Pt uses her car to get away from people, drive & cry if she needs to  Interventions: Interventions utilized:  Behavioral Activation and Supportive Counseling Standardized Assessments completed: Pt will complete the GAD-7 & PHQ-9 next session  Patient and/or Family Response: BF is supportive, but also lost in his own grief over death of his 2 Strs; one died of liver cancer & the other was invld in MVA  Assessment: Patient currently experiencing deep sense of loss about many things in her life. Pt lost her family home, her dogs are not staying w/her, the 2 Strs who died, the loss of both Parents, & the deaths of 3 Siblings  Patient may benefit from grief support, coping skills & psychoedu for anx/dep.  Plan: 1. Follow up with behavioral health clinician on : First Thur in Jan @ 1:00pm 2. Behavioral recommendations: Journal in a notebook to facilitate processing of feelings, events, & to complement psychotherapy process 3. Referral(s): Integrated [employer] (In Clinic)  I discussed the assessment and treatment plan with the patient and/or parent/guardian. They were provided an opportunity to ask questions and all were answered. They agreed with the plan and demonstrated an understanding of the instructions.   They were advised to call back or seek an in-person evaluation if the symptoms worsen or if the condition fails to improve as anticipated.  Mary Lever, LMFT

## 2020-11-05 ENCOUNTER — Other Ambulatory Visit: Payer: Self-pay | Admitting: *Deleted

## 2020-11-05 DIAGNOSIS — M48061 Spinal stenosis, lumbar region without neurogenic claudication: Secondary | ICD-10-CM

## 2020-11-05 NOTE — Telephone Encounter (Signed)
Patient requesting TiZANidine 4 mg. States in a lot of pain requesting it today.  If dont get today will call MD on call this weekend to get it.

## 2020-11-08 MED ORDER — TIZANIDINE HCL 4 MG PO TABS: 4.0000 mg | ORAL_TABLET | Freq: Four times a day (QID) | ORAL | 0 refills | Status: DC | PRN

## 2020-11-08 NOTE — Telephone Encounter (Signed)
I take issue with this person's Friday afternoon demand for a sedating controlled medication. I will refill it at this time, but the patient should be reminded that our office will need 2 business days to refill medications. Using our oncall physician for routine refills is inappropriate.   I am getting the sense that we are not meeting her expectations for managing her pain. We should offer to transfer her care to another clinic. I would be happy to make recommendations.

## 2020-11-09 ENCOUNTER — Other Ambulatory Visit: Payer: Self-pay | Admitting: *Deleted

## 2020-11-09 ENCOUNTER — Telehealth: Payer: Self-pay

## 2020-11-09 MED ORDER — FLUCONAZOLE 150 MG PO TABS: 150.0000 mg | ORAL_TABLET | ORAL | 0 refills | Status: DC

## 2020-11-09 NOTE — Telephone Encounter (Signed)
Pt calls and states she is continuing to have the "itch and a little discharge from the yeast, it is better but not gone"

## 2020-11-09 NOTE — Telephone Encounter (Signed)
Pls contact pt [phone]

## 2020-11-09 NOTE — Telephone Encounter (Signed)
Opened new encounter 

## 2020-11-15 ENCOUNTER — Other Ambulatory Visit: Payer: Self-pay | Admitting: Student in an Organized Health Care Education/Training Program

## 2020-11-15 ENCOUNTER — Other Ambulatory Visit: Payer: Self-pay

## 2020-11-15 DIAGNOSIS — M48061 Spinal stenosis, lumbar region without neurogenic claudication: Secondary | ICD-10-CM

## 2020-11-15 NOTE — Telephone Encounter (Signed)
tiZANidine (ZANAFLEX) 4 MG tablet, refill request @  [id number] [place], [place] - [address] Phone:  [phone]  Fax:  [phone]

## 2020-11-16 MED ORDER — TIZANIDINE HCL 4 MG PO TABS: 4.0000 mg | ORAL_TABLET | Freq: Four times a day (QID) | ORAL | 0 refills | Status: DC | PRN

## 2020-11-16 NOTE — Telephone Encounter (Signed)
Needs an appointment to further discuss continued refills.  She is using 3X a day at this rate which is not appropriate for this medication.  Will refill, but further refills based on appointment.

## 2020-11-19 ENCOUNTER — Other Ambulatory Visit: Payer: Self-pay | Admitting: Sports Medicine

## 2020-11-23 ENCOUNTER — Other Ambulatory Visit: Payer: Self-pay | Admitting: Internal Medicine

## 2020-11-23 ENCOUNTER — Other Ambulatory Visit: Payer: Self-pay | Admitting: Student in an Organized Health Care Education/Training Program

## 2020-11-23 ENCOUNTER — Other Ambulatory Visit: Payer: Self-pay

## 2020-11-23 DIAGNOSIS — M48061 Spinal stenosis, lumbar region without neurogenic claudication: Secondary | ICD-10-CM

## 2020-11-23 DIAGNOSIS — F411 Generalized anxiety disorder: Secondary | ICD-10-CM

## 2020-11-23 NOTE — Telephone Encounter (Signed)
Pt is requesting  Her tiZANidine (ZANAFLEX) 4 MG tablet sent to  [id number] [place], [place] - [address] AT [address] Phone:  [phone]  Fax:  [phone]

## 2020-11-24 MED ORDER — TIZANIDINE HCL 4 MG PO TABS
4.0000 mg | ORAL_TABLET | Freq: Four times a day (QID) | ORAL | 0 refills | Status: DC | PRN
Start: 2020-11-24 — End: 2020-12-07

## 2020-11-25 ENCOUNTER — Other Ambulatory Visit: Payer: Self-pay

## 2020-11-25 ENCOUNTER — Ambulatory Visit: Payer: Medicare Other | Admitting: Behavioral Health

## 2020-11-25 ENCOUNTER — Telehealth: Payer: Self-pay | Admitting: Behavioral Health

## 2020-11-25 NOTE — Telephone Encounter (Signed)
Pt checking on med for leg spasms. Spoke w/Stacie, RN to determine if script was filled. Med has been approved. Re-contacted Pt about calling Pharmacy to see pick-up time availability. Pt acknowledged & agreed.   Dr. Monna Fam

## 2020-11-28 NOTE — Telephone Encounter (Signed)
Please advise 

## 2020-12-07 ENCOUNTER — Ambulatory Visit: Payer: Medicare Other | Admitting: Behavioral Health

## 2020-12-07 ENCOUNTER — Telehealth: Payer: Self-pay | Admitting: Behavioral Health

## 2020-12-07 ENCOUNTER — Other Ambulatory Visit: Payer: Self-pay | Admitting: Student in an Organized Health Care Education/Training Program

## 2020-12-07 ENCOUNTER — Other Ambulatory Visit: Payer: Self-pay

## 2020-12-07 DIAGNOSIS — M48061 Spinal stenosis, lumbar region without neurogenic claudication: Secondary | ICD-10-CM

## 2020-12-07 MED ORDER — TIZANIDINE HCL 4 MG PO TABS: 4.0000 mg | ORAL_TABLET | Freq: Four times a day (QID) | ORAL | 0 refills | Status: DC | PRN

## 2020-12-07 NOTE — Telephone Encounter (Signed)
Contacted Pt for 3:00pm psychotherapy session. Pt is at an Urgent Care w/a friend who needed a ride due to injured finger. Pt wishes to r/s for another time.  Pt apologized for the inconvenience.   Dr. Monna Fam

## 2020-12-07 NOTE — Telephone Encounter (Signed)
Pt is requesting her  tiZANidine (ZANAFLEX) 4 MG tablet to be sent to  [id number] [place], [place] - [address] Phone:  [phone]  Fax:  [phone]

## 2020-12-13 ENCOUNTER — Other Ambulatory Visit: Payer: Self-pay | Admitting: Sports Medicine

## 2020-12-13 ENCOUNTER — Other Ambulatory Visit: Payer: Self-pay | Admitting: Student in an Organized Health Care Education/Training Program

## 2020-12-13 ENCOUNTER — Other Ambulatory Visit: Payer: Self-pay

## 2020-12-13 ENCOUNTER — Encounter: Payer: Medicare Other | Attending: Physical Medicine & Rehabilitation | Admitting: Physical Medicine & Rehabilitation

## 2020-12-13 ENCOUNTER — Encounter: Payer: Self-pay | Admitting: Physical Medicine & Rehabilitation

## 2020-12-13 VITALS — BP 144/83 | HR 84 | Temp 97.9°F | Ht 61.0 in | Wt 194.2 lb

## 2020-12-13 DIAGNOSIS — M25561 Pain in right knee: Secondary | ICD-10-CM | POA: Insufficient documentation

## 2020-12-13 DIAGNOSIS — R269 Unspecified abnormalities of gait and mobility: Secondary | ICD-10-CM | POA: Diagnosis present

## 2020-12-13 DIAGNOSIS — M545 Low back pain, unspecified: Secondary | ICD-10-CM

## 2020-12-13 DIAGNOSIS — Z720 Tobacco use: Secondary | ICD-10-CM

## 2020-12-13 DIAGNOSIS — G479 Sleep disorder, unspecified: Secondary | ICD-10-CM | POA: Diagnosis present

## 2020-12-13 DIAGNOSIS — G8929 Other chronic pain: Secondary | ICD-10-CM | POA: Diagnosis present

## 2020-12-13 DIAGNOSIS — M791 Myalgia, unspecified site: Secondary | ICD-10-CM | POA: Diagnosis present

## 2020-12-13 MED ORDER — GABAPENTIN 100 MG PO CAPS: 100.0000 mg | ORAL_CAPSULE | Freq: Every day | ORAL | 1 refills | Status: DC

## 2020-12-13 NOTE — Telephone Encounter (Signed)
Please advise 

## 2020-12-13 NOTE — Progress Notes (Signed)
Subjective:    Patient ID: ALEGRA ROST, female    DOB: [birth year], 60 y.o.   MRN: [id number]  HPI Female with pmh/psh of lumbar spinal stenosis, HTN, hypothyroidism, DM II, DDD, right CTS presents low back pain.  Limited historian. Started ~2010. Denies inciting event, later states got worse after a fall on her knee.  Getting worse.  Right lower back.  Ice/heat, rest, pressure help.  Cold weather, prolonged postures exacerbate the pain. All qualities of pain. Radiates to anterior right knee (also complains of right knee pain). Intermittent.  Associated weakness.  Denies associated numbness.  Denies falls since that time.  Pain limits ADLs.  Pain Inventory Average Pain 9 Pain Right Now 9 My pain is constant, sharp, burning and aching  In the last 24 hours, has pain interfered with the following? General activity 9 Relation with others 0 Enjoyment of life 8 What TIME of day is your pain at its worst? morning , daytime, evening, night and varies Sleep (in general) Poor  Pain is worse with: walking, bending, sitting and some activites Pain improves with: heat/ice and medication Relief from Meds: 3  walk without assistance walk with assistance use a cane use a walker ability to climb steps?  no do you drive?  yes  disabled: date disabled 21 I need assistance with the following:  bathing, toileting, meal prep, household duties and shopping  weakness numbness tingling trouble walking depression anxiety  Any changes since last visit?  no new patient  Any changes since last visit?  no    Family History  Problem Relation Age of Onset  . Heart attack Father 72  . Diabetes Mother   . Hypertension Mother   . Diabetes Sister   . Breast cancer Neg Hx   . Colon cancer Neg Hx    Social History   Socioeconomic History  . Marital status: Single    Spouse name: Not on file  . Number of children: Not on file  . Years of education: Not on file  . Highest education  level: Not on file  Occupational History  . Not on file  Tobacco Use  . Smoking status: Current Every Day Smoker    Packs/day: 0.50    Years: 15.00    Pack years: 7.50    Types: Cigarettes    Last attempt to quit: 01/18/1996    Years since quitting: 24.9  . Smokeless tobacco: Never Used  . Tobacco comment: 5-6 per day  Vaping Use  . Vaping Use: Never used  Substance and Sexual Activity  . Alcohol use: No    Alcohol/week: 0.0 standard drinks  . Drug use: No  . Sexual activity: Not Currently  Other Topics Concern  . Not on file  Social History Narrative  . Not on file   Social Determinants of Health   Financial Resource Strain: Not on file  Food Insecurity: Not on file  Transportation Needs: Not on file  Physical Activity: Not on file  Stress: Not on file  Social Connections: Not on file   Past Surgical History:  Procedure Laterality Date  . CARPAL TUNNEL RELEASE  05/08/2000   By Dr. Katy Fitch. Sypher, Montez Hageman.   Past Medical History:  Diagnosis Date  . Chronic interstitial cystitis    Followed by Dr. Marcine Matar  . COPD (chronic obstructive pulmonary disease) (HCC)   . DDD (degenerative disc disease), lumbar   . Degeneration of cervical intervertebral disc   . Depression   .  Diabetes mellitus, type 2 (HCC) 2007  . Heme positive stool    EGD on 04/08/2010 by Dr. Ewing Schlein showed chronic gastritis and a few gastric polyps; pathology showed a fundic gland polyp.  Colonoscopy on 04/08/2010 showed small external and internal hemorrhoids, and a few benign-appearing diminutive polyps in the rectum, the distal sigmoid colon, and in the distal descending colon; pathology showed hyperplastic polyps.   . Hypertension   . Hypothyroidism   . Iron deficiency anemia   . Spinal stenosis    BP (!) 144/83   Pulse 84   Temp 97.9 F (36.6 C)   Ht 5\' 1"  (1.549 m)   Wt 194 lb 3.2 oz (88.1 kg)   LMP 01/17/2009   SpO2 93%   BMI 36.69 kg/m   Opioid Risk Score:   Fall Risk Score:   `1  Depression screen PHQ 2/9  Depression screen [facility] 2/9 12/13/2020 08/06/2020 07/12/2020 07/02/2020 06/23/2020 06/16/2020 06/14/2020  Decreased Interest 3 3 0 0 0 3 0  Down, Depressed, Hopeless 2 3 0 0 0 0 0  PHQ - 2 Score 5 6 0 0 0 3 0  Altered sleeping 3 3 - - - 3 -  Tired, decreased energy 3 0 - - - 3 -  Change in appetite 1 3 - - - 3 -  Feeling bad or failure about yourself  0 0 - - - 0 -  Trouble concentrating 1 1 - - - 0 -  Moving slowly or fidgety/restless 1 3 - - - 3 -  Suicidal thoughts 0 0 - - - 0 -  PHQ-9 Score 14 16 - - - 15 -  Difficult doing work/chores - Extremely dIfficult - - - Somewhat difficult -  Some recent data might be hidden   Review of Systems  Constitutional: Positive for diaphoresis. Negative for unexpected weight change (gain).  Cardiovascular: Positive for leg swelling.  Gastrointestinal: Positive for constipation, diarrhea and nausea.  Musculoskeletal: Positive for arthralgias, back pain and gait problem.       Leg pain  All other systems reviewed and are negative.      Objective:   Physical Exam Constitutional: No distress . Vital signs reviewed. HENT: Normocephalic.  Atraumatic. Eyes: EOMI. No discharge. Cardiovascular: No JVD.  06/16/2020 Respiratory: Normal effort.  No stridor. GI: Non-distended. Skin: Warm and dry.  Intact. Psych: Normal mood.  Normal behavior. Musc: Right knee with tenderness Gait: Antalgic ROM RLE limited due to pain +TTP b/l lumbosacral PSPs and gluteal muscles Neuro: Alert Motor: LLE: 5/5 proximal to distal RLE: 4-/5 proximal to distal (pain inhibition) Neg SLR b/l    Assessment & Plan:  Female with pmh/psh of lumbar spinal stenosis, HTN, hypothyroidism, DM II, DDD, right CTS presents low back pain.    1. Chronic mechanical low back pain  MRI from 2015 showing DDD, facet arthropathy L2-3 lateral stenosis with ?L2 compression. Xray 06/2020 showing lumbar degenerative changes with anterolisthesis L3-4  Labs  reviewed  Chart/Referral information reviewed  PMAWARE reviewed  Continue heat/cold  Will consider PT with trial of TENS  Will consider Bracing  Continue Lidocaine patch  Will order Gabapentin 100 qhs  Will consider Cymbalta to 60mg  daily with food, pt states she is not taking at present.  Uncertain if she is taking Celexa  Continue Tizanidine  Will consider Mobic in place of Voltaren  Will consider referral to Psychology  Patient states main goal is to do ADLs   2. Gait abnormality  Will order cane  for safety  3. Sleep disturbance  Will consider Elavil 10mg  qhs, she is unsure if she is taking Cymbalta or Celexa  4. Morbid Obesity  Will consider referral to dietitian  5. Myalgia   Will consider trigger point injections  6. Right knee pain  Xray 05/2020 unremarkable  Continue Voltaren gel  7. Tobacco abuse  Denies COPD  ~1/2- 1 PPD  Counselled

## 2020-12-14 ENCOUNTER — Other Ambulatory Visit: Payer: Self-pay | Admitting: Student in an Organized Health Care Education/Training Program

## 2020-12-14 DIAGNOSIS — M48061 Spinal stenosis, lumbar region without neurogenic claudication: Secondary | ICD-10-CM

## 2020-12-14 NOTE — Telephone Encounter (Signed)
Called pt - Tizanidine was refilled 12/07/20. I asked pt how often is she taking this med ; she stated 2 tabs every 4 -6  Hrs. Stated the doctor she yesterday told her to continue taking Tizanidine.

## 2020-12-14 NOTE — Telephone Encounter (Signed)
Refill Request   tiZANidine (ZANAFLEX) 4 MG tablet  [id number], [place] - [address] AT [facility] [address] (Ph: [phone])

## 2020-12-14 NOTE — Telephone Encounter (Signed)
In my opinion that dose is too high and I worry about long term use of this medicine. It can be sedating in this person. She can clarify with Dr. Allena Katz from [facility] about their long term plan of using this medicine, but our intention was for this to be a short term medicine.

## 2020-12-15 ENCOUNTER — Other Ambulatory Visit: Payer: Self-pay | Admitting: Student in an Organized Health Care Education/Training Program

## 2020-12-15 DIAGNOSIS — M48061 Spinal stenosis, lumbar region without neurogenic claudication: Secondary | ICD-10-CM

## 2020-12-15 NOTE — Telephone Encounter (Signed)
  tiZANidine (ZANAFLEX) 4 MG tablet, REFILL REQUEST @  [id number] [place],  - [address] Phone:  [phone]  Fax:  [phone]

## 2020-12-16 ENCOUNTER — Other Ambulatory Visit: Payer: Self-pay | Admitting: Student in an Organized Health Care Education/Training Program

## 2020-12-16 ENCOUNTER — Telehealth: Payer: Self-pay

## 2020-12-16 ENCOUNTER — Other Ambulatory Visit: Payer: Self-pay

## 2020-12-16 DIAGNOSIS — M48061 Spinal stenosis, lumbar region without neurogenic claudication: Secondary | ICD-10-CM

## 2020-12-16 MED ORDER — TIZANIDINE HCL 4 MG PO TABS
4.0000 mg | ORAL_TABLET | Freq: Three times a day (TID) | ORAL | 0 refills | Status: DC | PRN
Start: 2020-12-16 — End: 2020-12-29

## 2020-12-16 NOTE — Telephone Encounter (Signed)
Received several refill requests for Tizanidine which was denied per Dr Oswaldo Done. Call pt to explain denial- no answer; left message to call the office.

## 2020-12-16 NOTE — Telephone Encounter (Signed)
Tyson Alias, MD   12/14/20 4:25 PM Note In my opinion that dose is too high and I worry about long term use of this medicine. It can be sedating in this person. She can clarify with Dr. Allena Katz from [facility] about their long term plan of using this medicine, but our intention was for this to be a short term medicine.      12/16/20 Received a TC from patient requesting refill on Tizanidine.  This RX was requested on 12/14/20 and Dr. Jacqualin Combes reply is above.  RN informed patient of MD's reply, she states she will call Dr. Allena Katz. SChaplin, RN,BSN

## 2020-12-20 ENCOUNTER — Encounter: Payer: Medicare Other | Admitting: Student in an Organized Health Care Education/Training Program

## 2020-12-28 ENCOUNTER — Telehealth: Payer: Self-pay | Admitting: Orthopaedic Surgery

## 2020-12-28 ENCOUNTER — Telehealth: Payer: Self-pay | Admitting: Behavioral Health

## 2020-12-28 ENCOUNTER — Ambulatory Visit: Payer: Medicare Other | Admitting: Behavioral Health

## 2020-12-28 ENCOUNTER — Other Ambulatory Visit: Payer: Self-pay

## 2020-12-28 NOTE — Telephone Encounter (Signed)
Pt would like to know if you could give her something to help with the pain in her right knee until she can be seen. Thank you. She would like a call stating wether you guys can;t or not. [phone]

## 2020-12-28 NOTE — Telephone Encounter (Signed)
Called patient and spoke with her. I explained that Dr.Whitfield does not prescribe pain medicine for arthritis. I mentioned about trying Voltaren gel. She said that she has tried that and everything else. I then recommended that she call her PCP and speak with them. I had to reexplain that Dr.Whitfield does not prescribe pain medicine unless you recently had surgery.

## 2020-12-28 NOTE — Telephone Encounter (Signed)
Patient has right knee osteoarthritis.

## 2020-12-28 NOTE — Telephone Encounter (Signed)
Have her check with her primary care physician.If she needs pain meds for her OA should be with her primary care MD

## 2020-12-28 NOTE — Telephone Encounter (Signed)
Contacted Pt for 3:00 appt. Pt in severe R leg pain since cortisone shot for pain. She is in distress & needs to contact [facility] at this time to address the pain issues.  Directed Pt to make her pain a priority & call the Practice at [facility].  We will r/s for 30 min visit at next available.  Dr. Monna Fam

## 2020-12-29 ENCOUNTER — Other Ambulatory Visit: Payer: Self-pay

## 2020-12-29 DIAGNOSIS — M48061 Spinal stenosis, lumbar region without neurogenic claudication: Secondary | ICD-10-CM

## 2020-12-29 MED ORDER — TIZANIDINE HCL 4 MG PO TABS: 4.0000 mg | ORAL_TABLET | Freq: Three times a day (TID) | ORAL | 0 refills | Status: DC | PRN

## 2020-12-29 NOTE — Telephone Encounter (Signed)
Call from pt asking about Tizanidine refill; inform request has already been sent to the doctor. She states she felled yesterday when going into her home; hurt her leg/back. States she used bio-freeze and a heating pad; felled asleep; noticed redness and small blisters on her leg. I asked if she needs to see a doctor; she states "no, I need my Tizanidine and I put vit c cream on it".

## 2020-12-29 NOTE — Telephone Encounter (Signed)
Pt is requesting her tiZANidine (ZANAFLEX) 4 MG tablet sent to  [id number] [address], [place] - [address] AT [facility] [address] [address] [facility] [address] Phone:  [phone]  Fax:  [phone]      ( pt took her last pill 12/28/20 )

## 2020-12-29 NOTE — Telephone Encounter (Signed)
Pt called / informed of refill. 

## 2020-12-30 ENCOUNTER — Other Ambulatory Visit: Payer: Self-pay | Admitting: *Deleted

## 2020-12-30 NOTE — Telephone Encounter (Signed)
Call from pt stating she's having a lot of pain after her fall; requesting an  refill on Tramadol. Thanks

## 2020-12-31 MED ORDER — TRAMADOL HCL 50 MG PO TABS: 50.0000 mg | ORAL_TABLET | Freq: Every day | ORAL | 1 refills | Status: DC

## 2021-01-04 ENCOUNTER — Telehealth: Payer: Self-pay | Admitting: Behavioral Health

## 2021-01-04 ENCOUNTER — Other Ambulatory Visit: Payer: Self-pay

## 2021-01-04 ENCOUNTER — Ambulatory Visit: Payer: Medicare Other | Admitting: Behavioral Health

## 2021-01-04 DIAGNOSIS — F411 Generalized anxiety disorder: Secondary | ICD-10-CM

## 2021-01-04 NOTE — BH Specialist Note (Signed)
Integrated Behavioral Health via Telemedicine Visit  01/04/2021 [name] [id number]  Number of Integrated Behavioral Health visits: 2/6 Session Start time: 1:30pm  Session End time: 2:00pm Total time: 30  Referring Provider: Dr. Orma Flaming, MD Patient/Family location: Pt at home in private [facility] Provider location: [facility] Office All persons participating in visit: Pt & Clinician Types of Service: Individual psychotherapy  I connected with Mary Gray and/or Mary Gray's self by Telephone  (Video is Caregility application) and verified that I am speaking with the correct person using two identifiers.Discussed confidentiality: Yes   I discussed the limitations of telemedicine and the availability of in person appointments.  Discussed there is a possibility of technology failure and discussed alternative modes of communication if that failure occurs.  I discussed that engaging in this telemedicine visit, they consent to the provision of behavioral healthcare and the services will be billed under their insurance.  Patient and/or legal guardian expressed understanding and consented to Telemedicine visit: Yes   Presenting Concerns: Patient and/or family reports the following symptoms/concerns: Health worries over R leg burn from heating pad which has a slide dial Duration of problem: wks-months; Severity of problem: mild  Patient and/or Family's Strengths/Protective Factors: Concrete supports in place (healthy food, safe environments, etc.) and Sense of purpose  Goals Addressed: Patient will: 1.  Reduce symptoms of: anxiety and depression  2.  Increase knowledge and/or ability of: coping skills and stress reduction  3.  Demonstrate ability to: Increase healthy adjustment to current life circumstances and Increase adequate support systems for patient/family  Progress towards Goals: Ongoing  Interventions: Interventions utilized:  Solution-Focused Strategies, Behavioral  Activation and Supportive Counseling Standardized Assessments completed: Not Needed  Patient and/or Family Response: Pt receptive to call today & open to making appt w/[facility] for her R leg pain  Assessment: Patient currently experiencing R upper thigh pain from burn due to nighttime use of heating pad. Pt thinks she mis-dialed the heat in the middle of the night. Upper thigh is reddish in color. Directed Pt she may need for the Physician to see her wound & give her a prescription.   Pt inquired about need for 2 other medication refills. Instructed Pt this Clinician does not handle medication refills, esp'ly not by size or color of pill.  At end of session, Lauren, RN on the Triage Pool agreed to call Pt for further instructions. Pt agreed.   Patient may benefit from discussion of her pain levels & how to approach this using psychological techniques that will address her concerns.  Plan: 1. Follow up with behavioral health clinician on : 2 wks out for 30 min on telehealth 2. Behavioral recommendations: Pt needs to address burn on upper R thigh w/a Physician as she is still c/o pain.  3. Referral(s): Integrated [employer] (In Clinic)  I discussed the assessment and treatment plan with the patient and/or parent/guardian. They were provided an opportunity to ask questions and all were answered. They agreed with the plan and demonstrated an understanding of the instructions.   They were advised to call back or seek an in-person evaluation if the symptoms worsen or if the condition fails to improve as anticipated.  Deneise Lever, LMFT

## 2021-01-04 NOTE — Telephone Encounter (Signed)
Patient c/o right leg burn (between knee and upper thigh) where she kept heating pad too long. Please call patient.

## 2021-01-04 NOTE — Telephone Encounter (Signed)
Called pt - stated her leg is still bothering her where she had used a heating pad, felled asleep and develop a burn. Stated she's unable to come today; call transferred to front office to schedule an appt. Appt schedule tomorrow @1315  PM with Dr .

## 2021-01-05 ENCOUNTER — Ambulatory Visit: Payer: Medicare Other | Admitting: Internal Medicine

## 2021-01-05 ENCOUNTER — Encounter: Payer: Self-pay | Admitting: Dietician

## 2021-01-11 ENCOUNTER — Ambulatory Visit: Payer: Self-pay

## 2021-01-11 ENCOUNTER — Ambulatory Visit (INDEPENDENT_AMBULATORY_CARE_PROVIDER_SITE_OTHER): Payer: Medicare Other | Admitting: Orthopaedic Surgery

## 2021-01-11 ENCOUNTER — Other Ambulatory Visit: Payer: Self-pay | Admitting: Student in an Organized Health Care Education/Training Program

## 2021-01-11 ENCOUNTER — Encounter: Payer: Self-pay | Admitting: Orthopaedic Surgery

## 2021-01-11 ENCOUNTER — Other Ambulatory Visit: Payer: Self-pay

## 2021-01-11 ENCOUNTER — Other Ambulatory Visit: Payer: Self-pay | Admitting: Physical Medicine & Rehabilitation

## 2021-01-11 VITALS — Ht 61.0 in | Wt 194.0 lb

## 2021-01-11 DIAGNOSIS — M545 Low back pain, unspecified: Secondary | ICD-10-CM

## 2021-01-11 DIAGNOSIS — G8929 Other chronic pain: Secondary | ICD-10-CM | POA: Diagnosis not present

## 2021-01-11 DIAGNOSIS — M1711 Unilateral primary osteoarthritis, right knee: Secondary | ICD-10-CM | POA: Diagnosis not present

## 2021-01-11 DIAGNOSIS — M25561 Pain in right knee: Secondary | ICD-10-CM | POA: Diagnosis not present

## 2021-01-11 MED ORDER — METHOCARBAMOL 500 MG PO TABS: ORAL_TABLET | ORAL | 0 refills | Status: DC

## 2021-01-11 NOTE — Progress Notes (Signed)
Office Visit Note   Patient: Mary Gray           Date of Birth: [birth year]           MRN: [id number] Visit Date: 01/11/2021              Requested by: Tyson Alias, MD [address] [place],  [place] [address] PCP: Tyson Alias, MD   Assessment & Plan: Visit Diagnoses:  1. Chronic pain of right knee   2. Chronic right-sided low back pain, unspecified whether sciatica present   3. Unilateral primary osteoarthritis, right knee   4. Chronic bilateral low back pain without sciatica     Plan: [name] relates that she fell several weeks ago and has exacerbated the pain in her right knee and her lumbar spine.  She is having low back pain with referred discomfort into her right lower extremity associated with some numbness and tingling.  She has had a remote history of back problems but I cannot find any evidence of prior advanced diagnostic testing.  I think it is worthwhile obtain an MRI scan.  She could easily have nerve root irritation or possibly spinal stenosis based on the significant degenerative changes of her lumbar spine today.  She also has arthritis of her right knee and has had good response to cortisone injection months ago.  I am hesitant to give another injection today until we determine the problem with her back.  We will try a course of Robaxin as well  Follow-Up Instructions: Return After MRI scan lumbar spine.   Orders:  Orders Placed This Encounter  Procedures  . XR KNEE 3 VIEW RIGHT  . XR Lumbar Spine 2-3 Views  . MR Lumbar Spine w/o contrast   Meds ordered this encounter  Medications  . methocarbamol (ROBAXIN) 500 MG tablet    Sig: Take 1 tablet twice daily prn.    Dispense:  30 tablet    Refill:  0      Procedures: No procedures performed   Clinical Data: No additional findings.   Subjective: Chief Complaint  Patient presents with  . Right Knee - Pain  Patient presents today for right knee pain. She was last seen  on 08/18/2020 and received a cortisone injection. Patient states that she fell 14days ago and landed on her right knee. She has been having pain all throughout and swelling since the fall. She also has pain in her lower back since the fall. She said that the pain will radiate down her leg. She has been experiencing some numbness in her feet. She states that her lower back spasms. She takes Tramadol daily for pain relief.  Has been involved in a pain clinic.  HPI  Review of Systems   Objective: Vital Signs: Ht 5\' 1"  (1.549 m)   Wt 194 lb (88 kg)   LMP 01/17/2009   BMI 36.66 kg/m   Physical Exam Constitutional:      Appearance: She is well-developed and well-nourished.  HENT:     Mouth/Throat:     Mouth: Oropharynx is clear and moist.  Eyes:     Extraocular Movements: EOM normal.     Pupils: Pupils are equal, round, and reactive to light.  Pulmonary:     Effort: Pulmonary effort is normal.  Skin:    General: Skin is warm and dry.  Neurological:     Mental Status: She is alert and oriented to person, place, and time.  Psychiatric:        Mood and Affect: Mood and affect normal.        Behavior: Behavior normal.     Ortho Exam right knee with some tenderness over the fibular head and lateral tibia.  No changes on x-ray.  No effusion.  No ecchymosis or erythema or swelling.  Full extension without effusion.  No instability.  Flexed over 100 degrees.  No popliteal pain.  Straight leg raise negative.  Some tenderness along the lower lumbar spine but without localization.  No pain over SI joints.  Painless range of motion both hips.  Neurologically appears to be intact  Specialty Comments:  No specialty comments available.  Imaging: XR KNEE 3 VIEW RIGHT  Result Date: 01/11/2021 Films of the right knee were obtained in 3 projections standing.  There is some peripheral osteophytes in the medial lateral compartments that are relatively mild.  The joint spaces are still maintained and  there is normal alignment.  No acute changes.  There is some lateral patella hypertrophy and mild tilting  XR Lumbar Spine 2-3 Views  Result Date: 01/11/2021 Films of the lumbar spine obtained in 2 projections.  This is grade 2 listhesis anteriorly of L5 on S1.  There are degenerative disc changes at L3-4 and L4-5 with narrowing of the spaces and slight anterior listhesis of L3 on 4.  There is a lumbar scoliosis with facet joint changes at L4-5 L5-S1 and even L3-4.  Diffuse calcification of the abdominal aorta without obvious aneurysmal dilatation.  There is calcification along the anterior longitudinal ligament diffusely but more in the proximal lumbar spine.  L1 might be minimally compressed.  These changes appear to be more progressive from films performed in 2021    PMFS History: Patient Active Problem List   Diagnosis Date Noted  . Chronic bilateral low back pain without sciatica 12/13/2020  . Sleep disturbance 12/13/2020  . Vaginitis 10/27/2020  . Right ear pain 10/13/2020  . Nausea 02/10/2020  . B12 deficiency 11/25/2018  . Urinary, incontinence, stress female 09/09/2018  . Unilateral primary osteoarthritis, right knee 05/01/2018  . Chronic venous insufficiency 02/26/2017  . Candidal skin infection 03/03/2014  . Preventative health care 05/14/2013  . AVN (avascular necrosis of bone) (HCC) 03/07/2013  . Spinal stenosis of lumbar region 03/01/2011  . Hypothyroidism 07/06/2006  . Diabetes mellitus type 2, uncontrolled (HCC) 07/06/2006  . Hyperlipemia 07/06/2006  . Generalized anxiety disorder 07/06/2006  . Essential hypertension 07/06/2006  . GERD 07/06/2006   Past Medical History:  Diagnosis Date  . Chronic interstitial cystitis    Followed by Dr. Marcine Matar  . COPD (chronic obstructive pulmonary disease) (HCC)   . DDD (degenerative disc disease), lumbar   . Degeneration of cervical intervertebral disc   . Depression   . Diabetes mellitus, type 2 (HCC) 2007  . Heme  positive stool    EGD on 04/08/2010 by Dr. Ewing Schlein showed chronic gastritis and a few gastric polyps; pathology showed a fundic gland polyp.  Colonoscopy on 04/08/2010 showed small external and internal hemorrhoids, and a few benign-appearing diminutive polyps in the rectum, the distal sigmoid colon, and in the distal descending colon; pathology showed hyperplastic polyps.   . Hypertension   . Hypothyroidism   . Iron deficiency anemia   . Spinal stenosis     Family History  Problem Relation Age of Onset  . Heart attack Father 35  . Diabetes Mother   . Hypertension Mother   . Diabetes Sister   .  Breast cancer Neg Hx   . Colon cancer Neg Hx     Past Surgical History:  Procedure Laterality Date  . CARPAL TUNNEL RELEASE  05/08/2000   By Dr. Katy Fitch. Sypher, Montez Hageman.   Social History   Occupational History  . Not on file  Tobacco Use  . Smoking status: Current Every Day Smoker    Packs/day: 0.50    Years: 15.00    Pack years: 7.50    Types: Cigarettes    Last attempt to quit: 01/18/1996    Years since quitting: 25.0  . Smokeless tobacco: Never Used  . Tobacco comment: 5-6 per day  Vaping Use  . Vaping Use: Never used  Substance and Sexual Activity  . Alcohol use: No    Alcohol/week: 0.0 standard drinks  . Drug use: No  . Sexual activity: Not Currently

## 2021-01-12 ENCOUNTER — Other Ambulatory Visit: Payer: Self-pay | Admitting: Student in an Organized Health Care Education/Training Program

## 2021-01-12 ENCOUNTER — Other Ambulatory Visit: Payer: Self-pay | Admitting: Sports Medicine

## 2021-01-12 ENCOUNTER — Other Ambulatory Visit: Payer: Self-pay | Admitting: Orthopaedic Surgery

## 2021-01-12 NOTE — Telephone Encounter (Signed)
Ok to prescribe

## 2021-01-12 NOTE — Telephone Encounter (Signed)
Please advise 

## 2021-01-13 ENCOUNTER — Other Ambulatory Visit: Payer: Self-pay

## 2021-01-13 ENCOUNTER — Encounter: Payer: Medicare Other | Attending: Physical Medicine & Rehabilitation | Admitting: Physical Medicine & Rehabilitation

## 2021-01-13 ENCOUNTER — Encounter: Payer: Self-pay | Admitting: Physical Medicine & Rehabilitation

## 2021-01-13 VITALS — BP 138/76 | HR 88 | Temp 98.2°F | Ht 61.0 in | Wt 187.0 lb

## 2021-01-13 DIAGNOSIS — G479 Sleep disorder, unspecified: Secondary | ICD-10-CM

## 2021-01-13 DIAGNOSIS — M791 Myalgia, unspecified site: Secondary | ICD-10-CM

## 2021-01-13 DIAGNOSIS — G8929 Other chronic pain: Secondary | ICD-10-CM | POA: Diagnosis present

## 2021-01-13 DIAGNOSIS — M545 Low back pain, unspecified: Secondary | ICD-10-CM | POA: Diagnosis present

## 2021-01-13 DIAGNOSIS — R269 Unspecified abnormalities of gait and mobility: Secondary | ICD-10-CM

## 2021-01-13 DIAGNOSIS — M48061 Spinal stenosis, lumbar region without neurogenic claudication: Secondary | ICD-10-CM

## 2021-01-13 MED ORDER — AMITRIPTYLINE HCL 10 MG PO TABS: 10.0000 mg | ORAL_TABLET | Freq: Every day | ORAL | 1 refills | Status: DC

## 2021-01-13 MED ORDER — GABAPENTIN 300 MG PO CAPS: 300.0000 mg | ORAL_CAPSULE | Freq: Three times a day (TID) | ORAL | 1 refills | Status: DC

## 2021-01-13 MED ORDER — TIZANIDINE HCL 4 MG PO TABS
4.0000 mg | ORAL_TABLET | Freq: Three times a day (TID) | ORAL | 0 refills | Status: DC | PRN
Start: 2021-01-13 — End: 2021-02-01

## 2021-01-13 NOTE — Progress Notes (Signed)
Subjective:    Patient ID: Mary Gray, female    DOB: 1961-04-11, 60 y.o.   MRN: [id number]  HPI Female with pmh/psh of lumbar spinal stenosis, HTN, hypothyroidism, DM II, DDD, right CTS presents low back pain.  Initially stated: Limited historian. Started ~2010. Denies inciting event, later states got worse after a fall on her knee.  Getting worse.  Right lower back.  Ice/heat, rest, pressure help.  Cold weather, prolonged postures exacerbate the pain. All qualities of pain. Radiates to anterior right knee (also complains of right knee pain). Intermittent.  Associated weakness.  Denies associated numbness.  Denies falls since that time.  Pain limits ADLs.  Last clinic visit on 12/13/2020.  Since that time, patient states she did not notice difference with Gabapentin. She states she had a fall in the snow.  She states she was not able to pick up her cane. She is smoking ~1/2 PPD. She went to Ortho and had films of her knees.  MRI ordered.   Pain Inventory Average Pain 10 Pain Right Now 10 My pain is constant, sharp, burning, stabbing and aching  In the last 24 hours, has pain interfered with the following? General activity 4 Relation with others 4 Enjoyment of life 4 What TIME of day is your pain at its worst? morning , daytime, evening and night Sleep (in general) Poor  Pain is worse with: walking, bending, sitting, inactivity and some activites Pain improves with: rest and heat/ice Relief from Meds: 0  walk without assistance walk with assistance use a cane use a walker ability to climb steps?  no do you drive?  yes  disabled: date disabled 31 I need assistance with the following:  bathing, toileting, meal prep, household duties and shopping  weakness numbness tingling trouble walking depression anxiety  Any changes since last visit?  no  Any changes since last visit?  no    Family History  Problem Relation Age of Onset  . Heart attack Father 28  .  Diabetes Mother   . Hypertension Mother   . Diabetes Sister   . Breast cancer Neg Hx   . Colon cancer Neg Hx    Social History   Socioeconomic History  . Marital status: Single    Spouse name: Not on file  . Number of children: Not on file  . Years of education: Not on file  . Highest education level: Not on file  Occupational History  . Not on file  Tobacco Use  . Smoking status: Current Every Day Smoker    Packs/day: 0.50    Years: 15.00    Pack years: 7.50    Types: Cigarettes    Last attempt to quit: 01/18/1996    Years since quitting: 25.0  . Smokeless tobacco: Never Used  . Tobacco comment: 5-6 per day  Vaping Use  . Vaping Use: Never used  Substance and Sexual Activity  . Alcohol use: No    Alcohol/week: 0.0 standard drinks  . Drug use: No  . Sexual activity: Not Currently  Other Topics Concern  . Not on file  Social History Narrative  . Not on file   Social Determinants of Health   Financial Resource Strain: Not on file  Food Insecurity: Not on file  Transportation Needs: Not on file  Physical Activity: Not on file  Stress: Not on file  Social Connections: Not on file   Past Surgical History:  Procedure Laterality Date  . CARPAL TUNNEL RELEASE  05/08/2000  By Dr. Katy Fitch. Sypher, Montez Hageman.   Past Medical History:  Diagnosis Date  . Chronic interstitial cystitis    Followed by Dr. Marcine Matar  . COPD (chronic obstructive pulmonary disease) (HCC)   . DDD (degenerative disc disease), lumbar   . Degeneration of cervical intervertebral disc   . Depression   . Diabetes mellitus, type 2 (HCC) 2007  . Heme positive stool    EGD on 04/08/2010 by Dr. Ewing Schlein showed chronic gastritis and a few gastric polyps; pathology showed a fundic gland polyp.  Colonoscopy on 04/08/2010 showed small external and internal hemorrhoids, and a few benign-appearing diminutive polyps in the rectum, the distal sigmoid colon, and in the distal descending colon; pathology showed  hyperplastic polyps.   . Hypertension   . Hypothyroidism   . Iron deficiency anemia   . Spinal stenosis    BP 138/76   Pulse 88   Temp 98.2 F (36.8 C)   Ht 5\' 1"  (1.549 m)   Wt 187 lb (84.8 kg)   LMP 01/17/2009   SpO2 95%   BMI 35.33 kg/m   Opioid Risk Score:   Fall Risk Score:  `1  Depression screen PHQ 2/9  Depression screen [facility] 2/9 12/13/2020 08/06/2020 07/12/2020 07/02/2020 06/23/2020 06/16/2020 06/14/2020  Decreased Interest 3 3 0 0 0 3 0  Down, Depressed, Hopeless 2 3 0 0 0 0 0  PHQ - 2 Score 5 6 0 0 0 3 0  Altered sleeping 3 3 - - - 3 -  Tired, decreased energy 3 0 - - - 3 -  Change in appetite 1 3 - - - 3 -  Feeling bad or failure about yourself  0 0 - - - 0 -  Trouble concentrating 1 1 - - - 0 -  Moving slowly or fidgety/restless 1 3 - - - 3 -  Suicidal thoughts 0 0 - - - 0 -  PHQ-9 Score 14 16 - - - 15 -  Difficult doing work/chores - Extremely dIfficult - - - Somewhat difficult -  Some recent data might be hidden   Review of Systems  Constitutional: Positive for diaphoresis. Unexpected weight change: gain.  HENT: Negative.   Eyes: Negative.   Respiratory: Negative.   Cardiovascular: Positive for leg swelling.  Gastrointestinal: Positive for constipation.  Endocrine: Negative.   Genitourinary: Negative.   Musculoskeletal: Positive for arthralgias, back pain and gait problem.       Leg pain  Skin: Negative.   Allergic/Immunologic: Negative.   Neurological: Positive for weakness.  Hematological: Negative.   Psychiatric/Behavioral: Negative.   All other systems reviewed and are negative.      Objective:   Physical Exam  Constitutional: No distress . Vital signs reviewed. HENT: Normocephalic.  Atraumatic. Eyes: EOMI. No discharge. Cardiovascular: No JVD.   Respiratory: Normal effort.  No stridor.   GI: Non-distended.   Skin: Warm and dry.  Intact. Psych: Normal mood.  Normal behavior. Musc: Right knee with tenderness Gait: Antalgic ROM RLE limited  due to pain +TTP b/l lumbosacral PSPs and gluteal muscles Neuro: Alert Motor: LLE: 5/5 proximal to distal RLE: 4-/5 proximal to distal (pain inhibition)    Assessment & Plan:  Female with pmh/psh of lumbar spinal stenosis, HTN, hypothyroidism, DM II, DDD, right CTS presents low back pain.    1. Chronic mechanical low back pain  MRI from 2015 showing DDD, facet arthropathy L2-3 lateral stenosis with ?L2 compression. Xray 06/2020 showing lumbar degenerative changes with anterolisthesis L3-4  Unable to tolerate Cymbalta, Robaxin  Continue heat/cold  Will order PT with trial of TENS  Will consider Bracing  Continue Lidocaine patch  Will increase Gabapentin to 300 TID  Continue Tizanidine  Will consider Mobic in place of Voltaren  Will consider referral to Psychology  Patient states main goal is to do ADLs   2. Gait abnormality  Encouraged cane for safety  3. Sleep disturbance  Will order Elavil 10mg  qhs, she states she is not taking Celexa or tramadol  4. Morbid Obesity  Will consider referral to dietitian  5. Myalgia   Will consider trigger point injections  6. Right knee pain  Xray 05/2020 unremarkable  Continue Voltaren gel  7. Tobacco abuse  Denies COPD  ~1/2- 1 PPD  Counselled

## 2021-01-14 ENCOUNTER — Other Ambulatory Visit: Payer: Self-pay | Admitting: Student in an Organized Health Care Education/Training Program

## 2021-01-14 DIAGNOSIS — M62830 Muscle spasm of back: Secondary | ICD-10-CM

## 2021-01-14 DIAGNOSIS — M48061 Spinal stenosis, lumbar region without neurogenic claudication: Secondary | ICD-10-CM

## 2021-01-14 MED ORDER — LIDOCAINE 5 % EX PTCH: MEDICATED_PATCH | CUTANEOUS | 1 refills | Status: DC

## 2021-01-14 NOTE — Telephone Encounter (Signed)
Refill  Request  Pt requesting the following:  MCG tablet  lidocaine (LIDODERM) 5 % Lidoderm patches  [id number] , [place] - [address] (Ph: [phone])

## 2021-01-18 ENCOUNTER — Other Ambulatory Visit: Payer: Self-pay

## 2021-01-18 ENCOUNTER — Ambulatory Visit: Payer: Medicare Other | Admitting: Behavioral Health

## 2021-01-18 DIAGNOSIS — F32A Depression, unspecified: Secondary | ICD-10-CM

## 2021-01-18 DIAGNOSIS — F419 Anxiety disorder, unspecified: Secondary | ICD-10-CM

## 2021-01-18 NOTE — BH Specialist Note (Signed)
Integrated Behavioral Health via Telemedicine Visit  01/18/2021 Mary Gray [id number]  Number of Integrated Behavioral Health visits: 3/6 Session Start time: 3:30pm  Session End time: 4:00pm Total time: 30  Referring Provider: Dr. Oswaldo Done, MD Patient/Family location: Pt at home in private [facility] Provider location: [facility] Office All persons participating in visit: Pt & Clinician Types of Service: Individual psychotherapy  I connected with [name] and/or Brand Males Swisher's self by Telephone  (Video is Caregility application) and verified that I am speaking with the correct person using two identifiers.Discussed confidentiality: Yes   I discussed the limitations of telemedicine and the availability of in person appointments.  Discussed there is a possibility of technology failure and discussed alternative modes of communication if that failure occurs.  I discussed that engaging in this telemedicine visit, they consent to the provision of behavioral healthcare and the services will be billed under their insurance.  Patient and/or legal guardian expressed understanding and consented to Telemedicine visit: Yes   Presenting Concerns: Patient and/or family reports the following symptoms/concerns: elevated anxiety in last week due to where & who she stays (BF's Str & Son) Duration of problem: a week; Severity of problem: mild  Patient and/or Family's Strengths/Protective Factors: Social connections and Concrete supports in place (healthy food, safe environments, etc.)  Goals Addressed: Patient will: 1.  Reduce symptoms of: anxiety, depression and stress  2.  Increase knowledge and/or ability of: coping skills, self-management skills and stress reduction  3.  Demonstrate ability to: Increase healthy adjustment to current life circumstances  Progress towards Goals: Ongoing  Interventions: Interventions utilized:  Supportive Counseling Standardized Assessments completed: Not  Needed  Patient and/or Family Response: Pt receptive to call today  Assessment: Patient currently experiencing elevated anxiety due to home circumstances.   Patient may benefit from inc in frustration tolerance.  Plan: 1. Follow up with behavioral health clinician on : one month for 30 min check-in 2. Behavioral recommendations: Get out of the house more for healthier mental wellness 3. Referral(s): Integrated [facility] [facility] (In Clinic)  I discussed the assessment and treatment plan with the patient and/or parent/guardian. They were provided an opportunity to ask questions and all were answered. They agreed with the plan and demonstrated an understanding of the instructions.   They were advised to call back or seek an in-person evaluation if the symptoms worsen or if the condition fails to improve as anticipated.  Deneise Lever, LMFT

## 2021-01-31 ENCOUNTER — Other Ambulatory Visit: Payer: Self-pay | Admitting: Physical Medicine & Rehabilitation

## 2021-01-31 DIAGNOSIS — M48061 Spinal stenosis, lumbar region without neurogenic claudication: Secondary | ICD-10-CM

## 2021-01-31 NOTE — Telephone Encounter (Signed)
Was this originally a one week precription?    The sig does not match the pill count

## 2021-02-01 ENCOUNTER — Telehealth: Payer: Self-pay

## 2021-02-01 NOTE — Telephone Encounter (Signed)
Low back pain and right leg pain. MRI not until April 3 rd. Pain level 10. Would like something for pain. [facility]

## 2021-02-01 NOTE — Telephone Encounter (Signed)
Requesting to speak with a nurse about meds. Please call pt back.  

## 2021-02-01 NOTE — Telephone Encounter (Signed)
Pt stated she's having MRI on 02/20/21 and she's requesting something to help her relax.

## 2021-02-02 ENCOUNTER — Other Ambulatory Visit: Payer: Self-pay

## 2021-02-02 ENCOUNTER — Telehealth: Payer: Self-pay

## 2021-02-02 DIAGNOSIS — G8929 Other chronic pain: Secondary | ICD-10-CM

## 2021-02-02 NOTE — Progress Notes (Signed)
amb ref °

## 2021-02-02 NOTE — Telephone Encounter (Signed)
If this is in reference to MRI of her knees, I am not following her for that; she should follow up with Ortho.

## 2021-02-02 NOTE — Telephone Encounter (Signed)
The order I see for the MRI is Lumbar

## 2021-02-02 NOTE — Telephone Encounter (Signed)
Patient called she stated the last couple of days her back and her legs have been in extreme pain she stated Dr.Patel upped her rx for tizanidine to 2 tablets every 4 hors she is requesting a stronger pain medication to be sent to the pharmacy call back:[phone]

## 2021-02-02 NOTE — Telephone Encounter (Signed)
Spoke with patient and let her know what Dr.Whitfield will not prescribe her any pain medicine. She is wanting a referral to pain management. Order has been placed.

## 2021-02-02 NOTE — Telephone Encounter (Signed)
Patient notified. She states she will call Ortho.

## 2021-02-02 NOTE — Telephone Encounter (Signed)
Ortho ordered her MRI.  None the less, she can increase her Tizanidine.  Thanks.

## 2021-02-03 MED ORDER — LORAZEPAM 0.5 MG PO TABS: ORAL_TABLET | ORAL | 0 refills | Status: DC

## 2021-02-03 NOTE — Telephone Encounter (Signed)
Ok. Oral ativan ordered and can be used during that MRI.

## 2021-02-04 ENCOUNTER — Other Ambulatory Visit: Payer: Self-pay | Admitting: Sports Medicine

## 2021-02-04 ENCOUNTER — Other Ambulatory Visit: Payer: Self-pay | Admitting: Physical Medicine & Rehabilitation

## 2021-02-04 NOTE — Telephone Encounter (Signed)
Please advise 

## 2021-02-05 ENCOUNTER — Other Ambulatory Visit: Payer: Self-pay | Admitting: Student in an Organized Health Care Education/Training Program

## 2021-02-05 ENCOUNTER — Other Ambulatory Visit: Payer: Self-pay | Admitting: Orthopaedic Surgery

## 2021-02-05 ENCOUNTER — Other Ambulatory Visit: Payer: Self-pay | Admitting: Physical Medicine & Rehabilitation

## 2021-02-05 DIAGNOSIS — F411 Generalized anxiety disorder: Secondary | ICD-10-CM

## 2021-02-05 DIAGNOSIS — E1165 Type 2 diabetes mellitus with hyperglycemia: Secondary | ICD-10-CM

## 2021-02-05 DIAGNOSIS — M48061 Spinal stenosis, lumbar region without neurogenic claudication: Secondary | ICD-10-CM

## 2021-02-07 NOTE — Telephone Encounter (Signed)
Ok to renew?  

## 2021-02-07 NOTE — Telephone Encounter (Signed)
Please advise 

## 2021-02-09 ENCOUNTER — Other Ambulatory Visit: Payer: Self-pay | Admitting: Internal Medicine

## 2021-02-09 ENCOUNTER — Other Ambulatory Visit: Payer: Self-pay | Admitting: Sports Medicine

## 2021-02-09 ENCOUNTER — Other Ambulatory Visit: Payer: Self-pay | Admitting: Student in an Organized Health Care Education/Training Program

## 2021-02-09 DIAGNOSIS — E034 Atrophy of thyroid (acquired): Secondary | ICD-10-CM

## 2021-02-09 DIAGNOSIS — F411 Generalized anxiety disorder: Secondary | ICD-10-CM

## 2021-02-09 NOTE — Telephone Encounter (Signed)
Please advise 

## 2021-02-13 ENCOUNTER — Other Ambulatory Visit: Payer: Self-pay | Admitting: Physical Medicine & Rehabilitation

## 2021-02-13 ENCOUNTER — Other Ambulatory Visit: Payer: Self-pay | Admitting: Orthopaedic Surgery

## 2021-02-13 ENCOUNTER — Other Ambulatory Visit: Payer: Self-pay | Admitting: Student in an Organized Health Care Education/Training Program

## 2021-02-13 DIAGNOSIS — F411 Generalized anxiety disorder: Secondary | ICD-10-CM

## 2021-02-14 ENCOUNTER — Other Ambulatory Visit: Payer: Self-pay | Admitting: Student in an Organized Health Care Education/Training Program

## 2021-02-14 DIAGNOSIS — F411 Generalized anxiety disorder: Secondary | ICD-10-CM

## 2021-02-14 NOTE — Telephone Encounter (Signed)
Ok to renew?  

## 2021-02-17 ENCOUNTER — Encounter: Payer: Medicare Other | Admitting: Physical Medicine & Rehabilitation

## 2021-02-17 ENCOUNTER — Other Ambulatory Visit: Payer: Self-pay

## 2021-02-17 NOTE — Progress Notes (Deleted)
Subjective:    Patient ID: Mary Gray, female    DOB: 08-Dec-1960, 60 y.o.   MRN: [id number]  TELEHEALTH NOTE  Due to national recommendations of social distancing due to COVID 19, an audio/video telehealth visit is felt to be most appropriate for this patient at this time.  See Chart message from today for the patient's consent to telehealth from [facility].     I verified that I am speaking with the correct person using two identifiers.  Location of patient: Home Location of provider: Office Method of communication: MyChart video Names of participants : Wadie Lessen scheduling, Glenna Fellows obtaining consent and vitals if available Established patient Time spent on call: ***  HPI Female with pmh/psh of lumbar spinal stenosis, HTN, hypothyroidism, DM II, DDD, right CTS presents low back pain.  Initially stated: Limited historian. Started ~2010. Denies inciting event, later states got worse after a fall on her knee.  Getting worse.  Right lower back.  Ice/heat, rest, pressure help.  Cold weather, prolonged postures exacerbate the pain. All qualities of pain. Radiates to anterior right knee (also complains of right knee pain). Intermittent.  Associated weakness.  Denies associated numbness.  Denies falls since that time.  Pain limits ADLs.  Last clinic visit on 01/13/2021.  Since that time, communication with patient regarding pain level and MRI.  emale with pmh/psh of lumbar spinal stenosis, HTN, hypothyroidism, DM II, DDD, right CTS presents low back pain.    1. Chronic mechanical low back pain  MRI from 2015 showing DDD, facet arthropathy L2-3 lateral stenosis with ?L2 compression. Xray 06/2020 showing lumbar degenerative changes with anterolisthesis L3-4  Unable to tolerate Cymbalta, Robaxin  Continue heat/cold  Will order PT with trial of TENS  Will consider Bracing  Continue Lidocaine patch  Will increase Gabapentin to 300 TID  Continue  Tizanidine  Will consider Mobic in place of Voltaren  Will consider referral to Psychology  Patient states main goal is to do ADLs   2. Gait abnormality  Encouraged cane for safety  3. Sleep disturbance  Will order Elavil 10mg  qhs, she states she is not taking Celexa or tramadol  4. Morbid Obesity  Will consider referral to dietitian  5. Myalgia   Will consider trigger point injections  6. Right knee pain  Xray 05/2020 unremarkable  Continue Voltaren gel  7. Tobacco abuse  Denies COPD  ~1/2- 1 PPD  Counselled  Pain Inventory Average Pain 10 Pain Right Now 10 My pain is constant, sharp, burning, stabbing and aching  In the last 24 hours, has pain interfered with the following? General activity 4 Relation with others 4 Enjoyment of life 4 What TIME of day is your pain at its worst? morning , daytime, evening and night Sleep (in general) Poor  Pain is worse with: walking, bending, sitting, inactivity and some activites Pain improves with: rest and heat/ice Relief from Meds: 0  walk without assistance walk with assistance use a cane use a walker ability to climb steps?  no do you drive?  yes  disabled: date disabled 57 I need assistance with the following:  bathing, toileting, meal prep, household duties and shopping  weakness numbness tingling trouble walking depression anxiety  Any changes since last visit?  no  Any changes since last visit?  no    Family History  Problem Relation Age of Onset  . Heart attack Father 64  . Diabetes Mother   . Hypertension Mother   .  Diabetes Sister   . Breast cancer Neg Hx   . Colon cancer Neg Hx    Social History   Socioeconomic History  . Marital status: Single    Spouse name: Not on file  . Number of children: Not on file  . Years of education: Not on file  . Highest education level: Not on file  Occupational History  . Not on file  Tobacco Use  . Smoking status: Current Every Day Smoker     Packs/day: 0.50    Years: 15.00    Pack years: 7.50    Types: Cigarettes    Last attempt to quit: 01/18/1996    Years since quitting: 25.1  . Smokeless tobacco: Never Used  . Tobacco comment: 5-6 per day  Vaping Use  . Vaping Use: Never used  Substance and Sexual Activity  . Alcohol use: No    Alcohol/week: 0.0 standard drinks  . Drug use: No  . Sexual activity: Not Currently  Other Topics Concern  . Not on file  Social History Narrative  . Not on file   Social Determinants of Health   Financial Resource Strain: Not on file  Food Insecurity: Not on file  Transportation Needs: Not on file  Physical Activity: Not on file  Stress: Not on file  Social Connections: Not on file   Past Surgical History:  Procedure Laterality Date  . CARPAL TUNNEL RELEASE  05/08/2000   By Dr. Katy Fitch. Sypher, Montez Hageman.   Past Medical History:  Diagnosis Date  . Chronic interstitial cystitis    Followed by Dr. Marcine Matar  . COPD (chronic obstructive pulmonary disease) (HCC)   . DDD (degenerative disc disease), lumbar   . Degeneration of cervical intervertebral disc   . Depression   . Diabetes mellitus, type 2 (HCC) 2007  . Heme positive stool    EGD on 04/08/2010 by Dr. Ewing Schlein showed chronic gastritis and a few gastric polyps; pathology showed a fundic gland polyp.  Colonoscopy on 04/08/2010 showed small external and internal hemorrhoids, and a few benign-appearing diminutive polyps in the rectum, the distal sigmoid colon, and in the distal descending colon; pathology showed hyperplastic polyps.   . Hypertension   . Hypothyroidism   . Iron deficiency anemia   . Spinal stenosis    LMP 01/17/2009   Opioid Risk Score:   Fall Risk Score:  `1  Depression screen PHQ 2/9  Depression screen [facility] 2/9 12/13/2020 08/06/2020 07/12/2020 07/02/2020 06/23/2020 06/16/2020 06/14/2020  Decreased Interest 3 3 0 0 0 3 0  Down, Depressed, Hopeless 2 3 0 0 0 0 0  PHQ - 2 Score 5 6 0 0 0 3 0  Altered sleeping 3 3  - - - 3 -  Tired, decreased energy 3 0 - - - 3 -  Change in appetite 1 3 - - - 3 -  Feeling bad or failure about yourself  0 0 - - - 0 -  Trouble concentrating 1 1 - - - 0 -  Moving slowly or fidgety/restless 1 3 - - - 3 -  Suicidal thoughts 0 0 - - - 0 -  PHQ-9 Score 14 16 - - - 15 -  Difficult doing work/chores - Extremely dIfficult - - - Somewhat difficult -  Some recent data might be hidden   Review of Systems  Constitutional: Positive for diaphoresis. Unexpected weight change: gain.  HENT: Negative.   Eyes: Negative.   Respiratory: Negative.   Cardiovascular: Positive for leg swelling.  Gastrointestinal: Positive for constipation.  Endocrine: Negative.   Genitourinary: Negative.   Musculoskeletal: Positive for arthralgias, back pain and gait problem.       Leg pain  Skin: Negative.   Allergic/Immunologic: Negative.   Neurological: Positive for weakness.  Hematological: Negative.   Psychiatric/Behavioral: Negative.   All other systems reviewed and are negative.      Objective:   Physical Exam  Constitutional: No distress . Vital signs reviewed. HENT: Normocephalic.  Atraumatic. Eyes: EOMI. No discharge. Cardiovascular: No JVD.   Respiratory: Normal effort.  No stridor.   GI: Non-distended.   Skin: Warm and dry.  Intact. Psych: Normal mood.  Normal behavior. Musc: Right knee with tenderness Gait: Antalgic ROM RLE limited due to pain +TTP b/l lumbosacral PSPs and gluteal muscles Neuro: Alert Motor: LLE: 5/5 proximal to distal RLE: 4-/5 proximal to distal (pain inhibition)    Assessment & Plan:  Female with pmh/psh of lumbar spinal stenosis, HTN, hypothyroidism, DM II, DDD, right CTS presents low back pain.    1. Chronic mechanical low back pain  MRI from 2015 showing DDD, facet arthropathy L2-3 lateral stenosis with ?L2 compression. Xray 06/2020 showing lumbar degenerative changes with anterolisthesis L3-4  Unable to tolerate Cymbalta, Robaxin  Continue  heat/cold  Will order PT with trial of TENS  Will consider Bracing  Continue Lidocaine patch  Will increase Gabapentin to 300 TID  Continue Tizanidine  Will consider Mobic in place of Voltaren  Will consider referral to Psychology  Patient states main goal is to do ADLs   2. Gait abnormality  Encouraged cane for safety  3. Sleep disturbance  Will order Elavil 10mg  qhs, she states she is not taking Celexa or tramadol  4. Morbid Obesity  Will consider referral to dietitian  5. Myalgia   Will consider trigger point injections  6. Right knee pain  Xray 05/2020 unremarkable  Continue Voltaren gel  7. Tobacco abuse  Denies COPD  ~1/2- 1 PPD  Counselled

## 2021-02-18 NOTE — Progress Notes (Signed)
This encounter was created in error - please disregard.

## 2021-02-20 ENCOUNTER — Ambulatory Visit
Admission: RE | Admit: 2021-02-20 | Discharge: 2021-02-20 | Disposition: A | Discharge status: Home / Self Care | Payer: Medicare Other | Source: Ambulatory Visit | Attending: Orthopaedic Surgery | Admitting: Orthopaedic Surgery

## 2021-02-20 ENCOUNTER — Other Ambulatory Visit: Payer: Self-pay

## 2021-02-20 ENCOUNTER — Other Ambulatory Visit: Payer: Self-pay | Admitting: Student in an Organized Health Care Education/Training Program

## 2021-02-20 DIAGNOSIS — G8929 Other chronic pain: Secondary | ICD-10-CM

## 2021-02-20 DIAGNOSIS — M545 Low back pain, unspecified: Secondary | ICD-10-CM | POA: Diagnosis not present

## 2021-02-20 DIAGNOSIS — I10 Essential (primary) hypertension: Secondary | ICD-10-CM

## 2021-02-22 ENCOUNTER — Ambulatory Visit: Payer: Medicare HMO | Admitting: Behavioral Health

## 2021-02-22 ENCOUNTER — Other Ambulatory Visit: Payer: Self-pay

## 2021-02-22 DIAGNOSIS — F411 Generalized anxiety disorder: Secondary | ICD-10-CM

## 2021-02-22 NOTE — BH Specialist Note (Signed)
Integrated Behavioral Health via Telemedicine Visit  02/22/2021 Mary Gray [id number]  Number of Integrated Behavioral Health visits: 4/6 Session Start time: 9:30am  Session End time: 10:00am Total time: 30  Referring Provider: Dr. Oswaldo Done, MD Patient/Family location: Pt at home in private [facility] Provider location: [facility] Office All persons participating in visit: Pt & Clinician Types of Service: Individual psychotherapy  I connected with [name] and/or Brand Males Borkowski's self via  Telephone or Video Enabled Telemedicine Application  (Video is Caregility application) and verified that I am speaking with the correct person using two identifiers. Discussed confidentiality: Yes   I discussed the limitations of telemedicine and the availability of in person appointments.  Discussed there is a possibility of technology failure and discussed alternative modes of communication if that failure occurs.  I discussed that engaging in this telemedicine visit, they consent to the provision of behavioral healthcare and the services will be billed under their insurance.  Patient and/or legal guardian expressed understanding and consented to Telemedicine visit: Yes   Presenting Concerns: Patient and/or family reports the following symptoms/concerns: pain issues w/R knee Duration of problem: since she moved from her Bros's home in [place] to [place] in May of 2021 when she injured her knee; Severity of problem: moderate  Patient and/or Family's Strengths/Protective Factors: Concrete supports in place (healthy food, safe environments, etc.)  Goals Addressed: Patient will: 1.  Reduce symptoms of: anxiety & dep related to estrangement from her older Bros (60yo)  2.  Increase knowledge and/or ability of: stress reduction & pain mgmt; Pt & Family relationships 3.  Demonstrate ability to: Increase healthy adjustment to current life circumstances and Begin healthy grieving over loss  Progress  towards Goals: Ongoing  Interventions: Interventions utilized:  Solution-Focused Strategies, Behavioral Activation and Psychoeducation and/or Health Education Standardized Assessments completed: Not Needed  Patient and/or Family Response: Pt receptive to call & immediately c/o pain. Pt did not sound fully awake. Pt agreed to future call in 2 wks.  Assessment: Patient currently experiencing pain issues being addressed by Orthopedist Dr. Cleophas [facility] in [place].   Patient may benefit from future sessions to discuss Family relationships, loyalty, & cut-offs.  Plan: 1. Follow up with behavioral health clinician on : 2 wks for 30 min check-in on telehealth. Pt does not have transportation. 2. Behavioral recommendations: Call both medical Offices to be put on a Wait List if anyone cancels. 3. Referral(s): Integrated [employer] (In Clinic)  I discussed the assessment and treatment plan with the patient and/or parent/guardian. They were provided an opportunity to ask questions and all were answered. They agreed with the plan and demonstrated an understanding of the instructions.   They were advised to call back or seek an in-person evaluation if the symptoms worsen or if the condition fails to improve as anticipated.  Deneise Lever, LMFT

## 2021-02-24 ENCOUNTER — Other Ambulatory Visit: Payer: Self-pay | Admitting: Student in an Organized Health Care Education/Training Program

## 2021-02-24 DIAGNOSIS — F411 Generalized anxiety disorder: Secondary | ICD-10-CM

## 2021-02-24 DIAGNOSIS — K219 Gastro-esophageal reflux disease without esophagitis: Secondary | ICD-10-CM

## 2021-02-27 ENCOUNTER — Other Ambulatory Visit: Payer: Self-pay

## 2021-02-27 ENCOUNTER — Encounter (HOSPITAL_COMMUNITY): Payer: Self-pay | Admitting: *Deleted

## 2021-02-27 ENCOUNTER — Ambulatory Visit (HOSPITAL_COMMUNITY)
Admission: EM | Admit: 2021-02-27 | Discharge: 2021-02-27 | Disposition: A | Discharge status: Home / Self Care | Payer: Medicare HMO | Attending: Student | Admitting: Student

## 2021-02-27 DIAGNOSIS — N3010 Interstitial cystitis (chronic) without hematuria: Secondary | ICD-10-CM | POA: Insufficient documentation

## 2021-02-27 DIAGNOSIS — J449 Chronic obstructive pulmonary disease, unspecified: Secondary | ICD-10-CM | POA: Diagnosis not present

## 2021-02-27 DIAGNOSIS — F17210 Nicotine dependence, cigarettes, uncomplicated: Secondary | ICD-10-CM | POA: Diagnosis not present

## 2021-02-27 DIAGNOSIS — Z880 Allergy status to penicillin: Secondary | ICD-10-CM | POA: Diagnosis not present

## 2021-02-27 DIAGNOSIS — Y929 Unspecified place or not applicable: Secondary | ICD-10-CM | POA: Insufficient documentation

## 2021-02-27 DIAGNOSIS — S39012A Strain of muscle, fascia and tendon of lower back, initial encounter: Secondary | ICD-10-CM | POA: Diagnosis not present

## 2021-02-27 DIAGNOSIS — Z791 Long term (current) use of non-steroidal anti-inflammatories (NSAID): Secondary | ICD-10-CM | POA: Diagnosis not present

## 2021-02-27 DIAGNOSIS — M549 Dorsalgia, unspecified: Secondary | ICD-10-CM

## 2021-02-27 DIAGNOSIS — Y939 Activity, unspecified: Secondary | ICD-10-CM | POA: Diagnosis not present

## 2021-02-27 DIAGNOSIS — E1169 Type 2 diabetes mellitus with other specified complication: Secondary | ICD-10-CM | POA: Insufficient documentation

## 2021-02-27 DIAGNOSIS — B373 Candidiasis of vulva and vagina: Secondary | ICD-10-CM | POA: Insufficient documentation

## 2021-02-27 DIAGNOSIS — W19XXXD Unspecified fall, subsequent encounter: Secondary | ICD-10-CM | POA: Diagnosis not present

## 2021-02-27 DIAGNOSIS — W19XXXA Unspecified fall, initial encounter: Secondary | ICD-10-CM | POA: Diagnosis not present

## 2021-02-27 DIAGNOSIS — B3731 Acute candidiasis of vulva and vagina: Secondary | ICD-10-CM

## 2021-02-27 DIAGNOSIS — T2121XA Burn of second degree of chest wall, initial encounter: Secondary | ICD-10-CM | POA: Diagnosis not present

## 2021-02-27 DIAGNOSIS — Z7984 Long term (current) use of oral hypoglycemic drugs: Secondary | ICD-10-CM | POA: Diagnosis not present

## 2021-02-27 DIAGNOSIS — N898 Other specified noninflammatory disorders of vagina: Secondary | ICD-10-CM

## 2021-02-27 HISTORY — DX: Dorsalgia, unspecified: M54.9

## 2021-02-27 LAB — POCT URINALYSIS DIPSTICK, ED / UC
Bilirubin Urine: NEGATIVE
Glucose, UA: NEGATIVE mg/dL
Ketones, ur: NEGATIVE mg/dL
Nitrite: NEGATIVE
Protein, ur: NEGATIVE mg/dL
Specific Gravity, Urine: 1.01 (ref 1.005–1.030)
Urobilinogen, UA: 0.2 mg/dL (ref 0.0–1.0)
pH: 6 (ref 5.0–8.0)

## 2021-02-27 MED ORDER — KETOROLAC TROMETHAMINE 60 MG/2ML IM SOLN
INTRAMUSCULAR | Status: AC
  Filled 2021-02-27: qty 2

## 2021-02-27 MED ORDER — FLUCONAZOLE 150 MG PO TABS: 150.0000 mg | ORAL_TABLET | Freq: Every day | ORAL | 0 refills | Status: DC

## 2021-02-27 MED ORDER — SILVER SULFADIAZINE 1 % EX CREA: 1.0000 "application " | TOPICAL_CREAM | Freq: Every day | CUTANEOUS | 0 refills | Status: AC

## 2021-02-27 MED ORDER — KETOROLAC TROMETHAMINE 60 MG/2ML IM SOLN
60.0000 mg | Freq: Once | INTRAMUSCULAR | Status: AC
  Administered 2021-02-27: 60 mg via INTRAMUSCULAR

## 2021-02-27 NOTE — ED Triage Notes (Addendum)
Pt states believes she has vaginal yeast infection; c/o vaginal irritation and discharge x 2 days.  Has been using Vagisil.  C/O low back pain radiating down into BLE - reports hx of low back pain.  Reports falling approx 9 days ago; states since then low back pain and bilat leg pain is much worse. C/O "tingling, burning, stabbing pain". Ambulating slowly with cane; states normally does not use cane.  States had MRI done 1 wk ago.  Also c/o right lateral breast burn from heating pad last night.  Unsure if blistering.

## 2021-02-27 NOTE — Discharge Instructions (Addendum)
-  For the burn on your breast, use the cream I sent over-silver sulfadiazine, once daily for 7 days.  You can apply bandage over this. Wash 1-2x daily with gentle soap and water. -For your yeast infection, start the Diflucan (fluconazole) -Avoid NSAIDs for the rest of the day (ibuprofen, Aleve, etc.) as we gave you a shot of Toradol today. -Continue the tizanidine and tramadol as prescribed by orthopedist. -Call your orthopedist on Monday (tomorrow) and ask for further evaluation. -If you develop new symptoms like numbness of your inner thigh, urinary incontinence (you can yourself), constipation-had straight to the emergency room.

## 2021-02-27 NOTE — ED Provider Notes (Signed)
[facility]    CSN: [id number] Arrival date & time: 02/27/21  1014      History   Chief Complaint Chief Complaint  Patient presents with  . Vaginal Itching  . Back Pain  . Burn    HPI Mary Gray is a 60 y.o. female presenting with multiple complaints- back pain, burn and vaginal irritation. Medical history chronic interstitial cystitis, COPD, DDD, type 2 diabetes, vaginitis.  -States she believes she has a vaginal yeast infection- vaginal irritation and discharge for 2 days.  Describes discharge as thin and white.  External vaginal irritation and itching.  Has been using Vagisil.  Notes long history of yeast infections that present like this.  States she is always treated by Diflucan with improvement in symptoms.  Denies urinary symptoms or STI risk. Denies hematuria, dysuria, frequency, urgency, n/v/d/abd pain, fevers/chills. Denies STI risk. -States she fell 1 week ago due to tripping on some stones.  Already followed by orthopedics and had an MRI after this.  Being treated appropriately with tramadol and Zanaflex.  Nonetheless presenting today with persistent back pain radiating down her bilateral legs.  Long history of low back pain, but she states that her back pain is worse than normal.  Describes this as tingling, burning, stabbing.  Ambulating with a cane due to pain.  Denies denies numbness in arms/legs, denies weakness in arms/legs, denies saddle anesthesia, denies bowel/bladder incontinence, denies peeing on herself. -Also with right lateral breast burn from heating pad.  States this occurred 1 day ago.  States she has had multiple burns from this heating pad in the past but continues to use it nonetheless.    HPI  Past Medical History:  Diagnosis Date  . Back pain   . Chronic interstitial cystitis    Followed by Dr. Marcine Matar  . COPD (chronic obstructive pulmonary disease) (HCC)   . DDD (degenerative disc disease), lumbar   . Degeneration of  cervical intervertebral disc   . Depression   . Diabetes mellitus, type 2 (HCC) 2007  . Heme positive stool    EGD on 04/08/2010 by Dr. Ewing Schlein showed chronic gastritis and a few gastric polyps; pathology showed a fundic gland polyp.  Colonoscopy on 04/08/2010 showed small external and internal hemorrhoids, and a few benign-appearing diminutive polyps in the rectum, the distal sigmoid colon, and in the distal descending colon; pathology showed hyperplastic polyps.   . Hypertension   . Hypothyroidism   . Iron deficiency anemia   . Spinal stenosis     Patient Active Problem List   Diagnosis Date Noted  . Chronic bilateral low back pain without sciatica 12/13/2020  . Sleep disturbance 12/13/2020  . Vaginitis 10/27/2020  . Right ear pain 10/13/2020  . Nausea 02/10/2020  . B12 deficiency 11/25/2018  . Urinary, incontinence, stress female 09/09/2018  . Unilateral primary osteoarthritis, right knee 05/01/2018  . Chronic venous insufficiency 02/26/2017  . Candidal skin infection 03/03/2014  . Preventative health care 05/14/2013  . AVN (avascular necrosis of bone) (HCC) 03/07/2013  . Spinal stenosis of lumbar region 03/01/2011  . Hypothyroidism 07/06/2006  . Diabetes mellitus type 2, uncontrolled (HCC) 07/06/2006  . Hyperlipemia 07/06/2006  . Generalized anxiety disorder 07/06/2006  . Essential hypertension 07/06/2006  . GERD 07/06/2006    Past Surgical History:  Procedure Laterality Date  . CARPAL TUNNEL RELEASE  05/08/2000   By Dr. Katy Fitch. Sypher, Montez Hageman.    OB History   No obstetric history on file.  Home Medications    Prior to Admission medications   Medication Sig Start Date End Date Taking? Authorizing Provider  amitriptyline (ELAVIL) 10 MG tablet TAKE 1 TABLET BY MOUTH EVERYDAY AT BEDTIME 02/04/21  Yes Marcello Fennel, MD  [facility] VITAMIN B12 1000 MCG tablet TAKE 1 TABLET BY MOUTH EVERY DAY 01/12/21  Yes Tyson Alias, MD  diclofenac Sodium (VOLTAREN) 1 % GEL  Apply 4 g topically 4 (four) times daily. 07/29/20  Yes [provider]  enalapril (VASOTEC) 10 MG tablet TAKE 1 TABLET BY MOUTH EVERY DAY 02/21/21  Yes Tyson Alias, MD  esomeprazole (NEXIUM) 40 MG capsule TAKE 1 CAPSULE BY MOUTH EVERY DAY 02/25/21  Yes Tyson Alias, MD  fluconazole (DIFLUCAN) 150 MG tablet Take 1 tablet (150 mg total) by mouth daily. Take 1 pill today.  Take the second pill in 3 days. 02/27/21  Yes Rhys Martini, PA-C  furosemide (LASIX) 40 MG tablet TAKE 1 TABLET BY MOUTH EVERY DAY 01/13/21  Yes Tyson Alias, MD  gabapentin (NEURONTIN) 300 MG capsule TAKE 1 CAPSULE BY MOUTH THREE TIMES A DAY 02/14/21  Yes Marcello Fennel, MD  hydrOXYzine (ATARAX/VISTARIL) 25 MG tablet TAKE 1 TABLET BY MOUTH EVERY 6 HOURS AS NEEDED FOR ANXIETY 11/23/20  Yes Tyson Alias, MD  levothyroxine (SYNTHROID) 88 MCG tablet TAKE 1 TABLET BY MOUTH EVERY DAY BEFORE BREAKFAST 02/09/21  Yes Tyson Alias, MD  lidocaine (LIDODERM) 5 % PLACE 3 PATCHES ONTO THE SKIN EVERY 12 HOURS. REMOVE AND DISCARD WITHIN 12 HRS OR AS DIRECTED BY MD 01/14/21  Yes Tyson Alias, MD  metFORMIN (GLUCOPHAGE-XR) 500 MG 24 hr tablet TAKE 2 TABLETS BY MOUTH TWICE A DAY 02/07/21  Yes Tyson Alias, MD  methocarbamol (ROBAXIN) 500 MG tablet TAKE 1 TABLET BY MOUTH TWICE A DAY AS NEEDED 02/14/21  Yes [name], MD  multivitamin-iron-minerals-folic acid (CENTRUM) chewable tablet Chew 1 tablet by mouth daily. 07/12/20  Yes Tyson Alias, MD  silver sulfADIAZINE (SILVADENE) 1 % cream Apply 1 application topically daily for 7 days. 02/27/21 03/06/21 Yes Rhys Martini, PA-C  tiZANidine (ZANAFLEX) 4 MG tablet TAKE 1 TABLET (4 MG TOTAL) BY MOUTH EVERY 8 (EIGHT) HOURS AS NEEDED FOR MUSCLE SPASMS 02/07/21  Yes Marcello Fennel, MD  traMADol (ULTRAM) 50 MG tablet Take 50 mg by mouth daily. 02/03/21  Yes [provider]  Accu-Chek Softclix Lancets lancets Check  blood sugar 1 time per day 07/12/20   Tyson Alias, MD  citalopram (CELEXA) 10 MG tablet TAKE 1 TABLET BY MOUTH EVERY DAY 02/21/21   Tyson Alias, MD  enalapril (VASOTEC) 5 MG tablet TAKE 1 TABLET BY MOUTH EVERY DAY 02/07/21   Tyson Alias, MD  glucose blood (ACCU-CHEK GUIDE) test strip Check blood sugar1 time per day 07/12/20   Tyson Alias, MD  LORazepam (ATIVAN) 0.5 MG tablet Take one tablet 30 minutes prior to MRI. Can take additional tablet at appointment if needed. 02/03/21   Tyson Alias, MD  neomycin-polymyxin-hydrocortisone (CORTISPORIN) 3.5-10000-1 OTIC suspension Place 4 drops into both ears 3 (three) times daily. 10/15/20   Eustace Moore, MD  ondansetron (ZOFRAN) 4 MG tablet Take 1 tablet (4 mg total) by mouth every 8 (eight) hours as needed for nausea or vomiting. 10/07/20 10/07/21  Albertha Ghee, MD  ropinirole (REQUIP) 5 MG tablet TAKE 1 TABLET BY MOUTH EVERYDAY AT BEDTIME 02/09/21   Asencion Islam, DPM  pravastatin (PRAVACHOL) 10  MG tablet TAKE 1 TABLET BY MOUTH EVERY DAY IN THE EVENING 01/16/19 10/15/20  Tyson Alias, MD    Family History Family History  Problem Relation Age of Onset  . Heart attack Father 37  . Diabetes Mother   . Hypertension Mother   . Diabetes Sister   . Breast cancer Neg Hx   . Colon cancer Neg Hx     Social History Social History   Tobacco Use  . Smoking status: Current Every Day Smoker    Packs/day: 0.50    Years: 15.00    Pack years: 7.50    Types: Cigarettes    Last attempt to quit: 01/18/1996    Years since quitting: 25.1  . Smokeless tobacco: Never Used  . Tobacco comment: 5-6 per day  Vaping Use  . Vaping Use: Never used  Substance Use Topics  . Alcohol use: No  . Drug use: No     Allergies   Codeine sulfate, Meperidine and related, Minocycline hcl, Penicillins, Propoxyphene n-acetaminophen, and Tetracycline hcl   Review of Systems Review of Systems  Constitutional:  Negative for appetite change, chills, diaphoresis, fever and unexpected weight change.  Respiratory: Negative for chest tightness and shortness of breath.   Cardiovascular: Negative for chest pain and palpitations.  Gastrointestinal: Negative for abdominal pain, blood in stool, constipation, diarrhea, nausea and vomiting.  Genitourinary: Positive for vaginal discharge. Negative for decreased urine volume, difficulty urinating, dysuria, flank pain, frequency, genital sores, hematuria, menstrual problem, pelvic pain, urgency, vaginal bleeding and vaginal pain.  Musculoskeletal: Positive for back pain. Negative for arthralgias, gait problem, joint swelling, myalgias, neck pain and neck stiffness.  Skin: Positive for wound.  Neurological: Negative for dizziness, tremors, seizures, syncope, facial asymmetry, speech difficulty, weakness, light-headedness, numbness and headaches.  All other systems reviewed and are negative.    Physical Exam Triage Vital Signs ED Triage Vitals  Enc Vitals Group     BP      Pulse      Resp      Temp      Temp src      SpO2      Weight      Height      Head Circumference      Peak Flow      Pain Score      Pain Loc      Pain Edu?      Excl. in GC?    No data found.  Updated Vital Signs BP 133/80   Pulse 87   Temp 98.2 F (36.8 C) (Oral)   Resp 14   LMP 01/17/2009   SpO2 95%   Visual Acuity Right Eye Distance:   Left Eye Distance:   Bilateral Distance:    Right Eye Near:   Left Eye Near:    Bilateral Near:     Physical Exam Vitals reviewed.  Constitutional:      General: She is not in acute distress.    Appearance: Normal appearance. She is not ill-appearing.  HENT:     Head: Normocephalic and atraumatic.     Mouth/Throat:     Mouth: Mucous membranes are moist.     Comments: Moist mucous membranes Eyes:     Extraocular Movements: Extraocular movements intact.     Pupils: Pupils are equal, round, and reactive to light.   Cardiovascular:     Rate and Rhythm: Normal rate and regular rhythm.     Heart sounds: Normal heart sounds.  Pulmonary:  Effort: Pulmonary effort is normal.     Breath sounds: Normal breath sounds and air entry. No wheezing, rhonchi or rales.  Abdominal:     General: Bowel sounds are normal. There is no distension.     Palpations: Abdomen is soft. There is no mass.     Tenderness: There is no abdominal tenderness. There is no right CVA tenderness, left CVA tenderness, guarding or rebound.     Comments: No bowel or bladder incontinence.  Musculoskeletal:     Cervical back: Normal range of motion. No swelling, deformity, signs of trauma, rigidity, spasms, tenderness, bony tenderness or crepitus. No pain with movement.     Thoracic back: No swelling, deformity, signs of trauma, spasms, tenderness or bony tenderness. Normal range of motion. No scoliosis.     Lumbar back: Spasms and tenderness present. No swelling, deformity, signs of trauma or bony tenderness. Normal range of motion. Negative right straight leg raise test and negative left straight leg raise test. No scoliosis.     Comments: Strength 5/5 in UEs and LEs. TTP lumbar paraspinous muscles, left worse than right.  No spinous tenderness, deformity, step-off.  Positive straight leg raise bilaterally.  Strength 5 out of 5 in arms and legs.  Sensation intact.  Gait intact but using cane due to pain.  Skin:    General: Skin is warm.     Capillary Refill: Capillary refill takes less than 2 seconds.     Comments: Good skin turgor R breast with small 1.5cm area of erythema with 2mm blister centrally. Painful to the touch but no warmth or discharge. Sensation intact.   Neurological:     General: No focal deficit present.     Mental Status: She is alert and oriented to person, place, and time.     Cranial Nerves: No cranial nerve deficit.     Comments: Strength 5/5 in UEs and LEs. Sensation intact in UEs and LEs.   Psychiatric:         Mood and Affect: Mood normal.        Behavior: Behavior normal.        Thought Content: Thought content normal.        Judgment: Judgment normal.      UC Treatments / Results  Labs (all labs ordered are listed, but only abnormal results are displayed) Labs Reviewed  POCT URINALYSIS DIPSTICK, ED / UC - Abnormal; Notable for the following components:      Result Value   Hgb urine dipstick SMALL (*)    Leukocytes,Ua SMALL (*)    All other components within normal limits  URINE CULTURE  CERVICOVAGINAL ANCILLARY ONLY    EKG   Radiology No results found.  Procedures Procedures (including critical care time)  Medications Ordered in UC Medications  ketorolac (TORADOL) injection 60 mg (has no administration in time range)    Initial Impression / Assessment and Plan / UC Course  I have reviewed the triage vital signs and the nursing notes.  Pertinent labs & imaging results that were available during my care of the patient were reviewed by me and considered in my medical decision making (see chart for details).     This patient is a 60 year old female presenting with back pain, yeast infection, burn. Today this pt is afebrile nontachycardic nontachypneic, oxygenating well on room air, no wheezes rhonchi or rales. No CVAT or abd pain.  No red flag symptoms.  MRI performed on 02-20-21 showed spinal canal and foraminal narrowing at L2  through 4.  This patient is already followed by orthopedics and is being treated appropriately with tramadol and tizanidine.  Unable to treat with steroid given her diabetes.  She has not taken any NSAIDs today and kidney function is normal, so Toradol administered today.  Patient with h/o interstitial cystitis.  UA with small blood and small leuk.  Culture sent, will wait to treat. Self swab sent.  Plant to treat empirically for vaginal candida with diflucan as below.   For burn, silver sulfadiazine sent. Wound care instructions provided.  F/u with  ortho Monday (1 day). ED return precautions discussed.  Final Clinical Impressions(s) / UC Diagnoses   Final diagnoses:  Vaginal candidiasis  Partial thickness burn of breast, initial encounter  Strain of lumbar region, initial encounter  Fall, subsequent encounter  Type 2 diabetes mellitus with other specified complication, unspecified whether long term insulin use (HCC)     Discharge Instructions     -For the burn on your breast, use the cream I sent over-silver sulfadiazine, once daily for 7 days.  You can apply bandage over this. Wash 1-2x daily with gentle soap and water. -For your yeast infection, start the Diflucan (fluconazole) -Avoid NSAIDs for the rest of the day (ibuprofen, Aleve, etc.) as we gave you a shot of Toradol today. -Continue the tizanidine and tramadol as prescribed by orthopedist. -Call your orthopedist on Monday (tomorrow) and ask for further evaluation. -If you develop new symptoms like numbness of your inner thigh, urinary incontinence (you can yourself), constipation-had straight to the emergency room.    ED Prescriptions    Medication Sig Dispense Auth. Provider   silver sulfADIAZINE (SILVADENE) 1 % cream Apply 1 application topically daily for 7 days. 7 g Rhys Martini, PA-C   fluconazole (DIFLUCAN) 150 MG tablet Take 1 tablet (150 mg total) by mouth daily. Take 1 pill today.  Take the second pill in 3 days. 2 tablet Rhys Martini, PA-C     PDMP not reviewed this encounter.   Rhys Martini, PA-C 02/27/21 1141

## 2021-02-28 ENCOUNTER — Other Ambulatory Visit: Payer: Self-pay | Admitting: Orthopaedic Surgery

## 2021-02-28 ENCOUNTER — Telehealth: Payer: Self-pay | Admitting: Internal Medicine

## 2021-02-28 ENCOUNTER — Telehealth: Payer: Self-pay | Admitting: Orthopaedic Surgery

## 2021-02-28 LAB — CERVICOVAGINAL ANCILLARY ONLY
Bacterial Vaginitis (gardnerella): NEGATIVE
Candida Glabrata: NEGATIVE
Candida Vaginitis: NEGATIVE
Chlamydia: NEGATIVE
Comment: NEGATIVE
Comment: NEGATIVE
Comment: NEGATIVE
Comment: NEGATIVE
Comment: NEGATIVE
Comment: NORMAL
Neisseria Gonorrhea: NEGATIVE
Trichomonas: NEGATIVE

## 2021-02-28 LAB — URINE CULTURE: Culture: NO GROWTH

## 2021-02-28 MED ORDER — KETOROLAC TROMETHAMINE 10 MG PO TABS: 10.0000 mg | ORAL_TABLET | Freq: Four times a day (QID) | ORAL | 0 refills | Status: DC | PRN

## 2021-02-28 NOTE — Telephone Encounter (Addendum)
   Reason for call:   I received a call from Ms. [name] at 8:20 PM indicating lower back pain.   Pertinent Data:   She reports that she has severe pain in both of her legs, radiates down to her ankles, and all along her back. She went to ED yesterday and given a Toradol shot. She states that the shot helped for awhile however has now worn off. She reports walking with a cane, endorses bilateral lower extremity weakness. Denied any numbness, urinary or bladder incontinence, falls, or other symptoms. She states that she has been taking more of her tramadol due to the pain and that she is now out of the tramadol. She has been taking gabapentin, tizanidine, and using voltaren gel. Also using biofreeze and and Roll-On. She has not been able to get the lidocaine patch due to insurance issues.   Patient has been having chronic lower back pain from spinal stenosis that worsened after it was exacerbated by a fall around July 2021. She has been following up with orthopedics and PM&R regarding her back pain, recently had an MRI that showed mod-severe spinal canal and bilateral neural foraminal narrowing at L2-3, L3-4, moderate L4-5 neural foraminal narrowing, mild left T12-L1, right L4-5, and bilateral L5-S1 neural foraminal narrowing.    Assessment / Plan / Recommendations:   This is a 60 year old female with a history of spinal stenosis, DM, COPD, chronic interstitial cystitis, HTN, hypothyroidism and depression contacting us for concerns about her chronic lower back pain. She is currently on tramadol 50 mg daily, tizanidine, gabapentin, and Voltaren gel with little/no relief.  There have been issues with follow up and high risk for diversion in the past regarding her opioid therapy. Pain appears to be similar to the pain that she has been experiencing, likely 2/2 spinal stenosis which she may be a candidate for surgical intervention. We discussed that there are not many other options from our stand  point, however since Toradol appeared to be helpful then we can do a short trial of this to see if it helps in addition to her current management. Discussed that I will also send a message to the front desk regarding her lidocaine patch approval. She will need to continue following with orthopedics and PM&R, and also has a pain management referral.    As always, pt is advised that if symptoms worsen or new symptoms arise, they should go to an urgent care facility or to to ER for further evaluation.   Claudean Severance, MD   02/28/2021, 8:24 PM

## 2021-02-28 NOTE — Telephone Encounter (Signed)
Spoke with patient. She first stated that she was not taking anything for pain. Then upon talking further she said that her PCP gives her tramadol once daily, but it does not help. I explained that Dr.Whitfield said no to prescribing any pain medicine. I encouraged her to call her PCP and discuss with them what they want her to do. Maybe they would try something different or up the dosage she takes daily. She was not happy with Dr.Whitfield's response, but states understanding of today's conversation.

## 2021-02-28 NOTE — Addendum Note (Signed)
Addended by: Claudean Severance on: 02/28/2021 09:01 PM   Modules accepted: Orders

## 2021-02-28 NOTE — Telephone Encounter (Signed)
Mary Gray has received multiple tramadol prescriptions by 3 different providers over a period of months-also muscle relaxants-if she wants more meds will need to contact them

## 2021-02-28 NOTE — Telephone Encounter (Signed)
Patient called requesting pain medication. Patient as upcoming MRI appt but states she is in severe pain and need meds. Please call patient at [phone]. Send to pharmacy on file

## 2021-03-01 ENCOUNTER — Telehealth: Payer: Self-pay | Admitting: Student in an Organized Health Care Education/Training Program

## 2021-03-01 ENCOUNTER — Telehealth: Payer: Self-pay | Admitting: *Deleted

## 2021-03-01 NOTE — Telephone Encounter (Signed)
Call transferred to triage as patient was crying from pain. Patient states pain starts in low back but is now extending down both legs from hips to ankles. Pain is constant x 1 week; rates 10+/10. C/o bilat leg tightness. She cannot tell if this is 2/2 fluid in legs. States she has had 4 falls since June 2021 as legs "give out "2/2 back pain. First available appt given tomorrow at 3:45 with Red Team. Patient understands if she cannot tolerate pain to head to ED or UC before tomorrow's appt. She is asking if her tramadol "can be upped" until her appt tomorrow.

## 2021-03-01 NOTE — Telephone Encounter (Addendum)
Information was called to [facility] Medicare Part D for PA for Lidocaine 5%  Patches.   Awaiting determination between 24-72 hours. PA [id number].  Angelina Ok, RN 03/01/2021 9:38AM. PA for Lidocaine Patches approved 11/20/2020 thru 11/19/2021. [phone].  Angelina Ok, RN 06/02/2021 9:41 AM.

## 2021-03-01 NOTE — Telephone Encounter (Signed)
Pt requesting  Call back.  Pt called to report can barley walk, her legs are aching all the way down to her ankles.  Pt wants to know what to do.

## 2021-03-01 NOTE — Telephone Encounter (Signed)
I agree with this plan. Unfortunately, I cannot prescribe additional pain medication without evaluating her.

## 2021-03-01 NOTE — Telephone Encounter (Signed)
Call to [facility] Medicare Part D.  PA information was given.  Awaiting determination within 24 to 72 hours.

## 2021-03-02 ENCOUNTER — Other Ambulatory Visit: Payer: Self-pay | Admitting: Student in an Organized Health Care Education/Training Program

## 2021-03-02 ENCOUNTER — Other Ambulatory Visit: Payer: Self-pay | Admitting: Physical Medicine & Rehabilitation

## 2021-03-02 ENCOUNTER — Ambulatory Visit (INDEPENDENT_AMBULATORY_CARE_PROVIDER_SITE_OTHER): Payer: Medicare HMO | Admitting: Internal Medicine

## 2021-03-02 VITALS — BP 145/65 | HR 87 | Temp 98.6°F | Wt 205.6 lb

## 2021-03-02 DIAGNOSIS — M545 Low back pain, unspecified: Secondary | ICD-10-CM | POA: Diagnosis not present

## 2021-03-02 DIAGNOSIS — M48061 Spinal stenosis, lumbar region without neurogenic claudication: Secondary | ICD-10-CM | POA: Diagnosis not present

## 2021-03-02 DIAGNOSIS — F411 Generalized anxiety disorder: Secondary | ICD-10-CM

## 2021-03-02 DIAGNOSIS — G8929 Other chronic pain: Secondary | ICD-10-CM | POA: Diagnosis not present

## 2021-03-02 DIAGNOSIS — E1165 Type 2 diabetes mellitus with hyperglycemia: Secondary | ICD-10-CM | POA: Diagnosis not present

## 2021-03-02 DIAGNOSIS — E871 Hypo-osmolality and hyponatremia: Secondary | ICD-10-CM

## 2021-03-02 LAB — GLUCOSE, CAPILLARY: Glucose-Capillary: 135 mg/dL — ABNORMAL HIGH (ref 70–99)

## 2021-03-02 LAB — POCT GLYCOSYLATED HEMOGLOBIN (HGB A1C): Hemoglobin A1C: 5.9 % — AB (ref 4.0–5.6)

## 2021-03-02 MED ORDER — KETOROLAC TROMETHAMINE 30 MG/ML IJ SOLN
30.0000 mg | Freq: Once | INTRAMUSCULAR | Status: AC
  Administered 2021-03-02: 30 mg via INTRAMUSCULAR

## 2021-03-02 NOTE — Patient Instructions (Signed)
Thank you, Mary Gray for allowing [place] to provide your care today. Today we discussed Low back pain.    I have ordered the following labs for you:   Lab Orders     BMP8+Anion Gap   Tests ordered today:  none  Referrals ordered today:   Referral Orders  No referral(s) requested today     Medication Changes:   There are no discontinued medications.   Meds ordered this encounter  Medications  . ketorolac (TORADOL) 30 MG/ML injection 30 mg     Instructions:  - Please follow up with your orthopedic surgeon tomorrow.  - Please go to the ED if you experience numbness between your thighs or bowel/bladder incontinence.  - Please follow up with Dr. Oswaldo Done in 1-3 months to follow up your chronic medical conditions.   Follow up: 1-3 months with Dr. Oswaldo Done   Remember:    Should you have any questions or concerns please call the internal medicine clinic at [phone].     Dellia Cloud, D.O. [facility]

## 2021-03-02 NOTE — Progress Notes (Signed)
CC: back pain  HPI:  [name] is a 60 y.o. female with a past medical history stated below and presents today for back pain. Please see problem based assessment and plan for additional details.  Past Medical History:  Diagnosis Date  . Back pain   . Chronic interstitial cystitis    Followed by Dr. Marcine Matar  . COPD (chronic obstructive pulmonary disease) (HCC)   . DDD (degenerative disc disease), lumbar   . Degeneration of cervical intervertebral disc   . Depression   . Diabetes mellitus, type 2 (HCC) 2007  . Heme positive stool    EGD on 04/08/2010 by Dr. Ewing Schlein showed chronic gastritis and a few gastric polyps; pathology showed a fundic gland polyp.  Colonoscopy on 04/08/2010 showed small external and internal hemorrhoids, and a few benign-appearing diminutive polyps in the rectum, the distal sigmoid colon, and in the distal descending colon; pathology showed hyperplastic polyps.   . Hypertension   . Hypothyroidism   . Iron deficiency anemia   . Spinal stenosis     Current Outpatient Medications on File Prior to Visit  Medication Sig Dispense Refill  . Accu-Chek Softclix Lancets lancets Check blood sugar 1 time per day 100 each 3  . amitriptyline (ELAVIL) 10 MG tablet TAKE 1 TABLET BY MOUTH EVERYDAY AT BEDTIME 90 tablet 1  . citalopram (CELEXA) 10 MG tablet TAKE 1 TABLET BY MOUTH EVERY DAY 90 tablet 1  . [facility] VITAMIN B12 1000 MCG tablet TAKE 1 TABLET BY MOUTH EVERY DAY 90 tablet 3  . diclofenac Sodium (VOLTAREN) 1 % GEL Apply 4 g topically 4 (four) times daily.    . enalapril (VASOTEC) 10 MG tablet TAKE 1 TABLET BY MOUTH EVERY DAY 90 tablet 3  . enalapril (VASOTEC) 5 MG tablet TAKE 1 TABLET BY MOUTH EVERY DAY 90 tablet 1  . esomeprazole (NEXIUM) 40 MG capsule TAKE 1 CAPSULE BY MOUTH EVERY DAY 90 capsule 1  . fluconazole (DIFLUCAN) 150 MG tablet Take 1 tablet (150 mg total) by mouth daily. Take 1 pill today.  Take the second pill in 3 days. 2 tablet 0  .  furosemide (LASIX) 40 MG tablet TAKE 1 TABLET BY MOUTH EVERY DAY 90 tablet 0  . gabapentin (NEURONTIN) 300 MG capsule TAKE 1 CAPSULE BY MOUTH THREE TIMES A DAY 90 capsule 1  . glucose blood (ACCU-CHEK GUIDE) test strip Check blood sugar1 time per day 100 each 3  . hydrOXYzine (ATARAX/VISTARIL) 25 MG tablet TAKE 1 TABLET BY MOUTH EVERY 6 HOURS AS NEEDED FOR ANXIETY 360 tablet 1  . ketorolac (TORADOL) 10 MG tablet Take 1 tablet (10 mg total) by mouth every 6 (six) hours as needed. 20 tablet 0  . levothyroxine (SYNTHROID) 88 MCG tablet TAKE 1 TABLET BY MOUTH EVERY DAY BEFORE BREAKFAST 30 tablet 11  . lidocaine (LIDODERM) 5 % PLACE 3 PATCHES ONTO THE SKIN EVERY 12 HOURS. REMOVE AND DISCARD WITHIN 12 HRS OR AS DIRECTED BY MD 50 patch 1  . LORazepam (ATIVAN) 0.5 MG tablet Take one tablet 30 minutes prior to MRI. Can take additional tablet at appointment if needed. 2 tablet 0  . metFORMIN (GLUCOPHAGE-XR) 500 MG 24 hr tablet TAKE 2 TABLETS BY MOUTH TWICE A DAY 360 tablet 1  . methocarbamol (ROBAXIN) 500 MG tablet TAKE 1 TABLET BY MOUTH TWICE A DAY AS NEEDED 30 tablet 0  . multivitamin-iron-minerals-folic acid (CENTRUM) chewable tablet Chew 1 tablet by mouth daily. 90 tablet 3  . neomycin-polymyxin-hydrocortisone (CORTISPORIN)  3.5-10000-1 OTIC suspension Place 4 drops into both ears 3 (three) times daily. 10 mL 0  . ondansetron (ZOFRAN) 4 MG tablet Take 1 tablet (4 mg total) by mouth every 8 (eight) hours as needed for nausea or vomiting. 10 tablet 0  . ropinirole (REQUIP) 5 MG tablet TAKE 1 TABLET BY MOUTH EVERYDAY AT BEDTIME 30 tablet 0  . silver sulfADIAZINE (SILVADENE) 1 % cream Apply 1 application topically daily for 7 days. 7 g 0  . traMADol (ULTRAM) 50 MG tablet Take 50 mg by mouth daily.    . [DISCONTINUED] pravastatin (PRAVACHOL) 10 MG tablet TAKE 1 TABLET BY MOUTH EVERY DAY IN THE EVENING 90 tablet 3   No current facility-administered medications on file prior to visit.    Family History   Problem Relation Age of Onset  . Heart attack Father 6  . Diabetes Mother   . Hypertension Mother   . Diabetes Sister   . Breast cancer Neg Hx   . Colon cancer Neg Hx     Social History   Socioeconomic History  . Marital status: Single    Spouse name: Not on file  . Number of children: Not on file  . Years of education: Not on file  . Highest education level: Not on file  Occupational History  . Not on file  Tobacco Use  . Smoking status: Current Every Day Smoker    Packs/day: 0.50    Years: 15.00    Pack years: 7.50    Types: Cigarettes    Last attempt to quit: 01/18/1996    Years since quitting: 25.1  . Smokeless tobacco: Never Used  . Tobacco comment: 5-6 per day  Vaping Use  . Vaping Use: Never used  Substance and Sexual Activity  . Alcohol use: No  . Drug use: No  . Sexual activity: Not Currently  Other Topics Concern  . Not on file  Social History Narrative  . Not on file   Social Determinants of Health   Financial Resource Strain: Not on file  Food Insecurity: Not on file  Transportation Needs: Not on file  Physical Activity: Not on file  Stress: Not on file  Social Connections: Not on file  Intimate Partner Violence: Not on file    Review of Systems: ROS negative except for what is noted on the assessment and plan.  Vitals:   03/02/21 1555  BP: (!) 145/65  Pulse: 87  Temp: 98.6 F (37 C)  TempSrc: Oral  SpO2: 97%  Weight: 205 lb 9.6 oz (93.3 kg)    Physical Exam: Gen: A&O x3 and in apparent distress,she appeared disheveled . HEENT: Head - normocephalic, atraumatic. Eye -  visual acuity grossly intact, conjunctiva clear, sclera non-icteric, EOM intact. Mouth - No obvious caries or periodontal disease. Neck: no obvious masses or nodules, AROM intact. CV: RRR, no murmurs, rubs, or gallops. S1/S2 presents  Resp: Clear to ascultation bilaterally  Abd: BS (+) x4, soft, non-tender, without obvious hepatosplenomegaly or masses MSK: Grossly  normal AROM and strength x4 extremities. Lumbar back pain that radiated down her legs bilaterally without focal weakness or numbness Skin: good skin turgor, no rashes, unusual bruising, or prominent lesions.  Neuro: No focal deficits, grossly normal sensation and coordination.  Psych: Oriented x3 and responding appropriately. Intact recent and remote memory, normal mood, judgement, affect , and insight.    Assessment & Plan:   See Encounters Tab for problem based charting.  Patient discussed with Dr. Heide Spark  Dellia Cloud, D.O. [facility] Internal Medicine, PGY-2 Pager: [phone], Phone: [phone] Date 03/04/2021 Time 1:35 PM

## 2021-03-03 ENCOUNTER — Encounter: Payer: Self-pay | Admitting: Orthopaedic Surgery

## 2021-03-03 ENCOUNTER — Other Ambulatory Visit: Payer: Self-pay | Admitting: Sports Medicine

## 2021-03-03 ENCOUNTER — Ambulatory Visit (INDEPENDENT_AMBULATORY_CARE_PROVIDER_SITE_OTHER): Payer: Medicare HMO | Admitting: Orthopaedic Surgery

## 2021-03-03 ENCOUNTER — Other Ambulatory Visit: Payer: Medicare HMO

## 2021-03-03 ENCOUNTER — Other Ambulatory Visit: Payer: Self-pay

## 2021-03-03 ENCOUNTER — Telehealth: Payer: Self-pay | Admitting: Internal Medicine

## 2021-03-03 VITALS — Ht 61.0 in | Wt 205.0 lb

## 2021-03-03 DIAGNOSIS — M545 Low back pain, unspecified: Secondary | ICD-10-CM | POA: Diagnosis not present

## 2021-03-03 DIAGNOSIS — E871 Hypo-osmolality and hyponatremia: Secondary | ICD-10-CM

## 2021-03-03 DIAGNOSIS — M1711 Unilateral primary osteoarthritis, right knee: Secondary | ICD-10-CM | POA: Diagnosis not present

## 2021-03-03 DIAGNOSIS — G8929 Other chronic pain: Secondary | ICD-10-CM

## 2021-03-03 LAB — BMP8+ANION GAP
Anion Gap: 18 mmol/L (ref 10.0–18.0)
BUN/Creatinine Ratio: 19 (ref 12–28)
BUN: 16 mg/dL (ref 8–27)
CO2: 24 mmol/L (ref 20–29)
Calcium: 8.7 mg/dL (ref 8.7–10.3)
Chloride: 77 mmol/L — ABNORMAL LOW (ref 96–106)
Creatinine, Ser: 0.85 mg/dL (ref 0.57–1.00)
Glucose: 128 mg/dL — ABNORMAL HIGH (ref 65–99)
Potassium: 5.2 mmol/L (ref 3.5–5.2)
Sodium: 119 mmol/L — CL (ref 134–144)
eGFR: 78 mL/min/{1.73_m2} (ref >59)

## 2021-03-03 MED ORDER — METHYLPREDNISOLONE ACETATE 40 MG/ML IJ SUSP
80.0000 mg | INTRAMUSCULAR | Status: AC | PRN
  Administered 2021-03-03: 80 mg via INTRA_ARTICULAR

## 2021-03-03 MED ORDER — BUPIVACAINE HCL 0.25 % IJ SOLN
2.0000 mL | INTRAMUSCULAR | Status: AC | PRN
  Administered 2021-03-03: 2 mL via INTRA_ARTICULAR

## 2021-03-03 MED ORDER — LIDOCAINE HCL 1 % IJ SOLN
2.0000 mL | INTRAMUSCULAR | Status: AC | PRN
  Administered 2021-03-03: 2 mL

## 2021-03-03 NOTE — Telephone Encounter (Signed)
  Reason for call:   I placed an outgoing call to [name] at 9:37 AM regarding critical lab value. The call was successful. We discussed Her sodium of 119.  She admits to fatigue and answer questions slowly, but her answers are appropriate. Otherwise, she denies any overt symptoms of hyponatremia.     Assessment/ Plan:   I counseled her on the importance of performing follow up lab work.   Will get serum Na/Osm and urine Na/osm.   As always, pt was advised that if symptoms worsen or new symptoms arise, they should go to the ER for further evaluation.    Mary Gray, D.O.  Internal Medicine Resident, PGY-2 [facility]  Pager: [phone] 9:37 AM, 03/03/2021

## 2021-03-03 NOTE — Addendum Note (Signed)
Addended by: Bufford Spikes on: 03/03/2021 03:11 PM   Modules accepted: Orders

## 2021-03-03 NOTE — Progress Notes (Signed)
Office Visit Note   Patient: Mary Gray           Date of Birth: May 13, 1961           MRN: [id number] Visit Date: 03/03/2021              Requested by: Tyson Alias, MD [address] [place],  [place] [address] PCP: Tyson Alias, MD   Assessment & Plan: Visit Diagnoses:  1. Chronic bilateral low back pain without sciatica   2. Unilateral primary osteoarthritis, right knee     Plan: Mary Gray had an MRI scan of her lumbar spine.  She has moderate to severe spinal canal and bilateral neuroforaminal narrowing at L2-3 and L3-4.  There was moderate left L4-5 neuroforaminal narrowing and mild left T12-L1 right L4-5 and bilateral L5-S1 neuroforaminal narrowing.  She has been having trouble for many years.  Several years ago she was seen by Dr. Lovell Sheehan who had her go through a course of physical therapy and she thinks even had some "injections.  She has had an exacerbation of her back pain since she has had several falls beginning in July of last year.  Neurologically she appears to be intact.  She is still having quite a bit of pain.  I have referred her to the pain clinic and she has an appointment in [place] next month.  She has had multiple prescriptions in the past from multiple providers and I am hesitant to give her any medicine at this point.  She knows the tramadol "this does not work."  She has had stronger meds in the past.  Given the chronicity of her problem with little relief with multiple modalities in the past I am going to refer her back to Dr. Lovell Sheehan she may be a candidate for repeat injections or possibly even a lumbar laminectomy.  She also has been seen in the past for osteoarthritis in her right knee.  That seems to have exacerbated so I am going to inject her knee with Depo-Medrol  Follow-Up Instructions: Return Refer to Dr. Lovell Sheehan in neurosurgery.   Orders:  Orders Placed This Encounter  Procedures  . Ambulatory referral to Neurosurgery    No orders of the defined types were placed in this encounter.     Procedures: Large Joint Inj: R knee on 03/03/2021 3:20 PM Indications: pain and diagnostic evaluation Details: 25 G 1.5 in needle, anteromedial approach  Arthrogram: No  Medications: 2 mL lidocaine 1 %; 80 mg methylPREDNISolone acetate 40 MG/ML; 2 mL bupivacaine 0.25 % Procedure, treatment alternatives, risks and benefits explained, specific risks discussed. Consent was given by the patient. Immediately prior to procedure a time out was called to verify the correct patient, procedure, equipment, support staff and site/side marked as required. Patient was prepped and draped in the usual sterile fashion.       Clinical Data: No additional findings.   Subjective: Chief Complaint  Patient presents with  . Lower Back - Follow-up    MRI review of L-Spine  Patient presents today for follow up on her lower back. She had an MRI done and is here today to discuss those results.  No change in her symptoms.  Still having trouble with her back and bilateral lower extremities.  She now uses a cane that she purchased at [employer].  Does note that she had seen Dr. Lovell Sheehan years ago for her back with a history of spinal stenosis and went through several courses  of physical therapy, had a TENS unit and even nerve blocks.  She denied any bowel or bladder changes. HPI  Review of Systems   Objective: Vital Signs: Ht 5\' 1"  (1.549 m)   Wt 205 lb (93 kg)   LMP 01/17/2009   BMI 38.73 kg/m   Physical Exam Constitutional:      Appearance: She is well-developed.  Eyes:     Pupils: Pupils are equal, round, and reactive to light.  Pulmonary:     Effort: Pulmonary effort is normal.  Skin:    General: Skin is warm and dry.  Neurological:     Mental Status: She is alert and oriented to person, place, and time.  Psychiatric:        Behavior: Behavior normal.     Ortho Exam appeared comfortable sitting.  The straight leg raise  was negative.  There was discomfort along the medial compartment of her right knee.  She is able to fully extend and flex over 100 degrees without instability.  No obvious effusion.  She does have films consistent with osteoarthritis.  She had multiple areas of tenderness to percussion along the lumbar spine and painless range of motion of both of her hips.  Motor exam appears to be intact  Specialty Comments:  No specialty comments available.  Imaging: No results found.   PMFS History: Patient Active Problem List   Diagnosis Date Noted  . Chronic bilateral low back pain without sciatica 12/13/2020  . Sleep disturbance 12/13/2020  . Vaginitis 10/27/2020  . Right ear pain 10/13/2020  . Nausea 02/10/2020  . B12 deficiency 11/25/2018  . Urinary, incontinence, stress female 09/09/2018  . Unilateral primary osteoarthritis, right knee 05/01/2018  . Chronic venous insufficiency 02/26/2017  . Candidal skin infection 03/03/2014  . Preventative health care 05/14/2013  . AVN (avascular necrosis of bone) (HCC) 03/07/2013  . Spinal stenosis of lumbar region 03/01/2011  . Hypothyroidism 07/06/2006  . Diabetes mellitus type 2, uncontrolled (HCC) 07/06/2006  . Hyperlipemia 07/06/2006  . Generalized anxiety disorder 07/06/2006  . Essential hypertension 07/06/2006  . GERD 07/06/2006   Past Medical History:  Diagnosis Date  . Back pain   . Chronic interstitial cystitis    Followed by Dr. 07/08/2006  . COPD (chronic obstructive pulmonary disease) (HCC)   . DDD (degenerative disc disease), lumbar   . Degeneration of cervical intervertebral disc   . Depression   . Diabetes mellitus, type 2 (HCC) 2007  . Heme positive stool    EGD on 04/08/2010 by Dr. 04/10/2010 showed chronic gastritis and a few gastric polyps; pathology showed a fundic gland polyp.  Colonoscopy on 04/08/2010 showed small external and internal hemorrhoids, and a few benign-appearing diminutive polyps in the rectum, the  distal sigmoid colon, and in the distal descending colon; pathology showed hyperplastic polyps.   . Hypertension   . Hypothyroidism   . Iron deficiency anemia   . Spinal stenosis     Family History  Problem Relation Age of Onset  . Heart attack Father 43  . Diabetes Mother   . Hypertension Mother   . Diabetes Sister   . Breast cancer Neg Hx   . Colon cancer Neg Hx     Past Surgical History:  Procedure Laterality Date  . CARPAL TUNNEL RELEASE  05/08/2000   By Dr. 05/10/2000. Sypher, Katy Fitch.   Social History   Occupational History  . Not on file  Tobacco Use  . Smoking status: Current Every Day Smoker  Packs/day: 0.50    Years: 15.00    Pack years: 7.50    Types: Cigarettes    Last attempt to quit: 01/18/1996    Years since quitting: 25.1  . Smokeless tobacco: Never Used  . Tobacco comment: 5-6 per day  Vaping Use  . Vaping Use: Never used  Substance and Sexual Activity  . Alcohol use: No  . Drug use: No  . Sexual activity: Not Currently

## 2021-03-04 ENCOUNTER — Other Ambulatory Visit: Payer: Self-pay | Admitting: Student in an Organized Health Care Education/Training Program

## 2021-03-04 ENCOUNTER — Encounter: Payer: Self-pay | Admitting: Internal Medicine

## 2021-03-04 DIAGNOSIS — E871 Hypo-osmolality and hyponatremia: Secondary | ICD-10-CM | POA: Insufficient documentation

## 2021-03-04 NOTE — Assessment & Plan Note (Addendum)
Presents for further evaluation of her lower back pain. States that she had a fall last Friday. She states that it was due to loss of balance. She fell on her left side. She states that after the fall she began having worse lumbar back pain. She went to the urgent care on Sunday were she was counseled to make a follow up appointment with her orthopedist, who she has not seen in some time. She was given a Toradol shot, tizanidine and tramadol on top of her chronic medications with include amitriptyline 10 mg, gabapentin 300 mg TID, robaxin 500 mg BID, ropinirole 5 mg qHS, Toradol 10 mg q6hr. All of this prescriptions have been recently fill in the last two weeks.   She die schedule an appointment with her orthopedist, Dr. Cleophas Dunker for tomorrow, but presents today for additional pain medicine. She admits to taking more than her prescribed dose of tramadol. I counseled her on her medications and the significant risk of complications that  Could occur her the combinations of medications that she is receiving. Therefore, I did not prescribe additional medications. I did, however, give her a 30 mg Toradol shot in the office today to help with her pain. I will also get a BMo as she has had many NSAIDs prescribed to her recently.   Patient denied any red flag symptoms today including saddle anesthesia or bowel.bladder incontinence.

## 2021-03-04 NOTE — Assessment & Plan Note (Signed)
Patient was found to have a low Na of 119 on recent BMP. She was called and brought back into the clinic for follow up labs.

## 2021-03-04 NOTE — Telephone Encounter (Signed)
Please advise 

## 2021-03-05 ENCOUNTER — Telehealth: Payer: Self-pay | Admitting: Internal Medicine

## 2021-03-05 ENCOUNTER — Other Ambulatory Visit: Payer: Self-pay | Admitting: Student in an Organized Health Care Education/Training Program

## 2021-03-05 DIAGNOSIS — F411 Generalized anxiety disorder: Secondary | ICD-10-CM

## 2021-03-05 LAB — BMP8+ANION GAP
Anion Gap: 19 mmol/L — ABNORMAL HIGH (ref 10.0–18.0)
BUN/Creatinine Ratio: 17 (ref 12–28)
BUN: 16 mg/dL (ref 8–27)
CO2: 25 mmol/L (ref 20–29)
Calcium: 9.1 mg/dL (ref 8.7–10.3)
Chloride: 80 mmol/L — ABNORMAL LOW (ref 96–106)
Creatinine, Ser: 0.92 mg/dL (ref 0.57–1.00)
Glucose: 112 mg/dL — ABNORMAL HIGH (ref 65–99)
Potassium: 4.6 mmol/L (ref 3.5–5.2)
Sodium: 124 mmol/L — ABNORMAL LOW (ref 134–144)
eGFR: 71 mL/min/{1.73_m2} (ref >59)

## 2021-03-05 LAB — OSMOLALITY: Osmolality Meas: 258 mOsmol/kg — ABNORMAL LOW (ref 275–295)

## 2021-03-05 MED ORDER — TRAMADOL HCL 50 MG PO TABS: 50.0000 mg | ORAL_TABLET | Freq: Every day | ORAL | 0 refills | Status: DC

## 2021-03-05 MED ORDER — FLUCONAZOLE 150 MG PO TABS: 150.0000 mg | ORAL_TABLET | Freq: Every day | ORAL | 0 refills | Status: DC

## 2021-03-05 NOTE — Telephone Encounter (Signed)
   Reason for call:   I received a call from Ms. [name] at 11:40 PM indicating persistent vaginal symptoms and back pain.    Pertinent Data:   This is a 60 year old female with a history of candida vaginitis and chronic back pain.   She states she has a history of yeast infections, went to urgent care on 4/10 for her symptoms and was given fluconazole, she took one tablet with some improvement and then the second tablet 3 days later. She states that her symptoms have been persistent, including vaginal irritation, white discharge, burning sensation, and some burning with urination. She state the itchiness has improved. She has been using femenine wipes. She denies any recent sexual activity. On chart review, she had a carvicovaginal test that was negative for candida, trichomonas, BV, GC/chlamydia. Also had a non infectious U/A and no growth on urine culture.   Also requesting refill on her tramadol, she was not aware that clinic was closed today. Patient has history of chronic bilateral low back pain, saw orthopedics yesterday and has been referred to NSG and pain management. She also went to our clinic on 4/13 for additional pain medications, she was given a tramadol injection, there was also discussion about the risks of the combination of her medications so no additional pain medications were prescribed.   Assessment / Plan / Recommendations:   Patient was prescribed fluconazole empirically for a yeast infection, reports some improvement with fluconazole initially however then had persistence of her symptoms. Wet prep was negative for candida infection. She continues to endorse symptoms consistent with a yeast infection, including vaginal irritation, white discharge, and vaginal soreness. While labs were not consistent with a yeast infection since she did have some improvement with fluconazole will give additional dose of fluconazole for now. Advised patient to return to clinic on Monday or  Tuesday for repeat pelvic exam and testing to clarify cause of her symptoms.   In regards to her back pain, patient has been on tramadol daily for a few months now, however she reports taking more then her prescribed dose of tramadol recently due to her acute on chronic back pain. Last refill on 3/17 according to PDMP. Patient has a follow up with NSG and pain management and has been following up with orthopedics appropriately. Discussed importance of taking medications as prescribed and to not take additional doses.. Will refill her tramadol for now, advised to continue taking her other medications and continue following up with her providers.   As always, pt is advised that if symptoms worsen or new symptoms arise, they should go to an urgent care facility or to to ER for further evaluation.   Claudean Severance, MD   03/05/2021, 12:27 AM

## 2021-03-07 NOTE — Progress Notes (Signed)
Internal Medicine Clinic Attending  Case discussed with Dr. Marchia Bond  At the time of the visit.  We reviewed the resident's history and exam and pertinent patient test results.  I agree with the assessment, diagnosis, and plan of care documented in the resident's note.  Patient's sodium had improved to 124 on follow up labs

## 2021-03-08 ENCOUNTER — Telehealth: Payer: Self-pay | Admitting: Internal Medicine

## 2021-03-08 ENCOUNTER — Telehealth: Payer: Self-pay

## 2021-03-08 NOTE — Telephone Encounter (Signed)
Requesting to speak with a nurse about yeast infection and leg pain. Please call pt back.

## 2021-03-08 NOTE — Telephone Encounter (Signed)
[facility] 24 HOUR URGENT NEED LINE  I received a call from Gilford Rile through the operator, requesting medicine for a vaginal yeast infection. She notes that she had received a 3d course of fluconazole that she took last week, however symptoms have persisted.  She endorses clear vaginal discharge that has become a "little darker" over the past few days. She denies vaginal bleeding. She endorses severe burning, pruritis, and dysuria. She has taken her temperature at home, which she says has been normal. She has chronic back pain that she reports is 10/10. When I ask her how this compares to her regular back pain, she was unable to really specify. She notes numbness of her legs which she said has been present for weeks-months and that she has been referred to neurosurgery. She denies any change in this. Denies urinary incontinence or retention.  I explained that she needs to be evaluated in person for the above complaints. It does not seem that this would necessarily require emergent evaluation tonight without fever or chills or significant neurologic changes. I offered to have our office call her in the morning to arrange to be seen hopefully tomorrow. I reviewed reasons to seek evaluation in the ED with her and she expressed understanding.   Message sent to the front desk to call to arrange a visit.  Elige Radon, MD Internal Medicine Resident PGY-2 [facility] Pager: [phone] 03/08/2021 6:07 PM

## 2021-03-09 ENCOUNTER — Encounter: Payer: Medicaid Other | Admitting: Internal Medicine

## 2021-03-09 NOTE — Telephone Encounter (Signed)
Pt was called to schedule an appt for yeast infection. Call transferred to front office - appt scheduled for this morning with Dr Mcarthur Rossetti @ 1045 AM.

## 2021-03-09 NOTE — Addendum Note (Signed)
Addended by: Chari Manning on: 03/09/2021 01:35 PM   Modules accepted: Level of Service

## 2021-03-10 ENCOUNTER — Ambulatory Visit (INDEPENDENT_AMBULATORY_CARE_PROVIDER_SITE_OTHER): Payer: Medicare HMO | Admitting: Internal Medicine

## 2021-03-10 ENCOUNTER — Other Ambulatory Visit: Payer: Self-pay

## 2021-03-10 ENCOUNTER — Other Ambulatory Visit (HOSPITAL_COMMUNITY)
Admission: RE | Admit: 2021-03-10 | Discharge: 2021-03-10 | Disposition: A | Discharge status: Home / Self Care | Payer: Medicare HMO | Source: Ambulatory Visit | Attending: Student in an Organized Health Care Education/Training Program | Admitting: Student in an Organized Health Care Education/Training Program

## 2021-03-10 ENCOUNTER — Encounter: Payer: Self-pay | Admitting: Internal Medicine

## 2021-03-10 VITALS — BP 159/69 | HR 66 | Temp 98.2°F | Ht 62.0 in | Wt 184.8 lb

## 2021-03-10 DIAGNOSIS — B372 Candidiasis of skin and nail: Secondary | ICD-10-CM

## 2021-03-10 DIAGNOSIS — I10 Essential (primary) hypertension: Secondary | ICD-10-CM | POA: Diagnosis not present

## 2021-03-10 DIAGNOSIS — N760 Acute vaginitis: Secondary | ICD-10-CM

## 2021-03-10 DIAGNOSIS — E871 Hypo-osmolality and hyponatremia: Secondary | ICD-10-CM

## 2021-03-10 MED ORDER — ENALAPRIL MALEATE 20 MG PO TABS
20.0000 mg | ORAL_TABLET | Freq: Every day | ORAL | 1 refills | Status: DC
Start: 2021-03-10 — End: 2021-04-04

## 2021-03-10 MED ORDER — NYSTATIN 100000 UNIT/GM EX POWD: 1.0000 "application " | Freq: Three times a day (TID) | CUTANEOUS | 0 refills | Status: DC

## 2021-03-10 NOTE — Addendum Note (Signed)
Addended by: Bufford Spikes on: 03/10/2021 11:43 AM   Modules accepted: Orders

## 2021-03-10 NOTE — Patient Instructions (Addendum)
Ms Tyson Masin,  It was a pleasure seeing you in clinic. Today we discussed:   Vaginal/Urinary symptoms: I am checking for a urinary tract infection and vaginal infection and will call you with abnormal results and to further discuss treatment options  Rash: I am sending a prescription for nystatin powder. Please use this three times daily under breasts. If no improvement, please contact us.   High blood pressure: Your blood pressure was elevated at this visit. At this time, I will increase your blood pressure medication to 20mg  daily. Please follow up in 4 weeks for BP check.   Low sodium: I am rechecking your labs at this visit. I will call you with any abnormal results  If you have any questions or concerns, please call our clinic at [phone] between 9am-5pm and after hours call [phone] and ask for the internal medicine resident on call. If you feel you are having a medical emergency please call 911.   Thank you, we look forward to helping you remain healthy!  If you have not gotten the COVID vaccine, I recommend doing so:  You may get it at your local [facility] or [facility] OR To schedule an appointment for a COVID vaccine or be added to the vaccine wait list: Go to [phone]   OR Go to [email or web address]                  OR Call [phone]                                     OR Call [phone] and select Option 2

## 2021-03-10 NOTE — Progress Notes (Signed)
   CC: vaginal irritation/discharge, rash   HPI:  [name] is a 60 y.o. female with PMHx as stated below is presenting for evaluation of ongoing vaginal irritation with now brown-ish discharge for several weeks duration. She has tried dflucan x2 without significant improvement. She is also endorsing a new rash with some odor under bilateral breasts for two days. Please see problem based charting for complete assessment and plan.  Past Medical History:  Diagnosis Date  . Back pain   . Chronic interstitial cystitis    Followed by Dr. Marcine Matar  . COPD (chronic obstructive pulmonary disease) (HCC)   . DDD (degenerative disc disease), lumbar   . Degeneration of cervical intervertebral disc   . Depression   . Diabetes mellitus, type 2 (HCC) 2007  . Heme positive stool    EGD on 04/08/2010 by Dr. Ewing Schlein showed chronic gastritis and a few gastric polyps; pathology showed a fundic gland polyp.  Colonoscopy on 04/08/2010 showed small external and internal hemorrhoids, and a few benign-appearing diminutive polyps in the rectum, the distal sigmoid colon, and in the distal descending colon; pathology showed hyperplastic polyps.   . Hypertension   . Hypothyroidism   . Iron deficiency anemia   . Spinal stenosis    Review of Systems:  Negative except as stated as HPI.  Physical Exam:  Vitals:   03/10/21 1001  Weight: 184 lb 12.8 oz (83.8 kg)  Height: 5\' 2"  (1.575 m)   Physical Exam  Constitutional: Obese female; No distress.  HENT: Normocephalic and atraumatic, EOMI Cardiovascular: Normal rate, regular rhythm, S1 and S2 present, no murmurs, rubs, gallops.  Respiratory: No respiratory distress, Lungs are clear to auscultation bilaterally. GI: Nondistended, soft, nontender to palpation, active bowel sounds Musculoskeletal: Normal bulk and tone.  No peripheral edema noted. Neurological: Is alert and oriented x4, no apparent focal deficits noted. Skin: Warm and dry.  Intertrigo  under bilateral breasts GU exam deferred due to discomfort with prior exam   Assessment & Plan:   See Encounters Tab for problem based charting.  Patient discussed with Dr. 

## 2021-03-11 ENCOUNTER — Telehealth: Payer: Self-pay

## 2021-03-11 LAB — URINALYSIS, ROUTINE W REFLEX MICROSCOPIC
Bilirubin, UA: NEGATIVE
Glucose, UA: NEGATIVE
Ketones, UA: NEGATIVE
Nitrite, UA: NEGATIVE
Protein,UA: NEGATIVE
RBC, UA: NEGATIVE
Specific Gravity, UA: 1.007 (ref 1.005–1.030)
Urobilinogen, Ur: 0.2 mg/dL (ref 0.2–1.0)
pH, UA: 7 (ref 5.0–7.5)

## 2021-03-11 LAB — MICROSCOPIC EXAMINATION
Bacteria, UA: NONE SEEN
Casts: NONE SEEN /lpf
Epithelial Cells (non renal): NONE SEEN /hpf (ref 0–10)
RBC, Urine: NONE SEEN /hpf (ref 0–2)
WBC, UA: NONE SEEN /hpf (ref 0–5)

## 2021-03-11 LAB — CERVICOVAGINAL ANCILLARY ONLY
Bacterial Vaginitis (gardnerella): POSITIVE — AB
Candida Glabrata: NEGATIVE
Candida Vaginitis: NEGATIVE
Chlamydia: NEGATIVE
Comment: NEGATIVE
Comment: NEGATIVE
Comment: NEGATIVE
Comment: NEGATIVE
Comment: NEGATIVE
Comment: NORMAL
Neisseria Gonorrhea: NEGATIVE
Trichomonas: NEGATIVE

## 2021-03-11 LAB — SODIUM, URINE, RANDOM: Sodium, Ur: 58 mmol/L

## 2021-03-11 LAB — OSMOLALITY, URINE: Osmolality, Ur: 206 mOsmol/kg

## 2021-03-11 MED ORDER — METRONIDAZOLE 500 MG PO TABS: 500.0000 mg | ORAL_TABLET | Freq: Two times a day (BID) | ORAL | 0 refills | Status: AC

## 2021-03-11 NOTE — Assessment & Plan Note (Signed)
Patient was found to be significantly hyponatremic on last visit 4/15 with Na 119. Repeat BMP today with Na 127. Serum osm 258. Urine osm 208 and urine Na 58. She is not on thiazide diuretic or steroid. She does have a history of hypothyroidism and is on levothyroxine with most recent TSH of 4.058 in April 2021. Patient will need further evaluation for glucocorticoid deficiency with AM cortisol and ACTH stim test.   Plan: Check TSH and AM cortisol

## 2021-03-11 NOTE — Assessment & Plan Note (Addendum)
Patient is presenting for evaluation of ongoing vaginal irritation for several weeks. She has a history of atrophic vaginitis and has had multiple evaluations for BV in the past. She notes that initially she just had irritation and mild clear vaginal discharge; however, this has become more brown over the past several days. She went to the urgent care on 4/10 and was prescribed diflucan which provided some relief; she was prescribed diflucan 150mg  q72hr for 2 doses on 4/16 which she reports as taking. However, she notes persistence of symptoms with mild left flank pain and subjective chills. She also endorses some dysuria. Patient deferred GU exam at this visit as she noted that her prior exam resulted in excessive vaginal bleeding.  Urinalysis negative for UTI. Ancillary testing positive for bacterial vaginosis.   Plan:  Metronidazole 500mg  twice daily for 7 days. If persistent, can try topical gel for 7-14 days  Suspect her underlying atrophic vaginitis is contributing to her frequent BV infections and she would benefit from topical estrogen therapy; however, this may be quite expensive for her. Will re-visit if her symptoms become significant

## 2021-03-11 NOTE — Assessment & Plan Note (Signed)
Patient endorses several days of rash under her bilateral breasts. This is itchy and slightly malodorous. Examination is consistent with intertrigo.  Plan: Nystatin powder tid

## 2021-03-11 NOTE — Telephone Encounter (Signed)
TC to patient per Dr. Joretta Bachelor instructions to have her schedule a lab only visit next week. VM obtained and VM Box was full.  RN unable to leave message.  Will try again Monday. SChaplin, RN,BSN

## 2021-03-11 NOTE — Assessment & Plan Note (Signed)
BP Readings from Last 3 Encounters:  03/10/21 (!) 159/69  03/02/21 (!) 145/65  02/27/21 133/80   Patient's blood pressure elevated initially 182/93, repeat 159/69 at this visit. Suspect this is in setting of ongoing back pain. However, will need improved BP control.  Plan: Increase enalapril to 20mg  daily F/u in 4 weeks for BP and BMP check

## 2021-03-12 LAB — BMP8+ANION GAP
Anion Gap: 17 mmol/L (ref 10.0–18.0)
BUN/Creatinine Ratio: 19 (ref 12–28)
BUN: 14 mg/dL (ref 8–27)
CO2: 23 mmol/L (ref 20–29)
Calcium: 8.6 mg/dL — ABNORMAL LOW (ref 8.7–10.3)
Chloride: 87 mmol/L — ABNORMAL LOW (ref 96–106)
Creatinine, Ser: 0.74 mg/dL (ref 0.57–1.00)
Glucose: 91 mg/dL (ref 65–99)
Potassium: 5.1 mmol/L (ref 3.5–5.2)
Sodium: 127 mmol/L — ABNORMAL LOW (ref 134–144)
eGFR: 93 mL/min/{1.73_m2} (ref >59)

## 2021-03-12 LAB — OSMOLALITY: Osmolality Meas: 265 mOsmol/kg — ABNORMAL LOW (ref 275–295)

## 2021-03-14 NOTE — Telephone Encounter (Signed)
TC to patient, notified Dr. Mcarthur Rossetti is requesting she come in for labwork this week.  Patient states she has an appt w/ Dr. Monna Fam tomorrow morning and will get the labwork at that time. Appt with Dr. Monna Fam at 0930.  Lab appt made for 1030. SChaplin, RN,BSN

## 2021-03-14 NOTE — Progress Notes (Signed)
[facility] Clinic Attending ° °Case discussed with Dr. Aslam  At the time of the visit.  We reviewed the resident’s history and exam and pertinent patient test results.  I agree with the assessment, diagnosis, and plan of care documented in the resident’s note.  °

## 2021-03-15 ENCOUNTER — Other Ambulatory Visit: Payer: Self-pay | Admitting: Internal Medicine

## 2021-03-15 ENCOUNTER — Other Ambulatory Visit: Payer: Self-pay

## 2021-03-15 ENCOUNTER — Ambulatory Visit: Payer: Medicare HMO | Admitting: Behavioral Health

## 2021-03-15 DIAGNOSIS — G8929 Other chronic pain: Secondary | ICD-10-CM

## 2021-03-15 DIAGNOSIS — F419 Anxiety disorder, unspecified: Secondary | ICD-10-CM

## 2021-03-15 DIAGNOSIS — F331 Major depressive disorder, recurrent, moderate: Secondary | ICD-10-CM

## 2021-03-15 MED ORDER — KETOROLAC TROMETHAMINE 10 MG PO TABS: 10.0000 mg | ORAL_TABLET | Freq: Four times a day (QID) | ORAL | 0 refills | Status: DC | PRN

## 2021-03-15 NOTE — Progress Notes (Signed)
     Reason for call:   I received a call from [name] at 7:53 PM indicating her chronic low back pain is bothering her and she can not take a shower   Pertinent Data:    She was seen by orthopedic surgery who referred her to neurosurgery for probable injection.  She is also on tramadol at home.  The pain radiates to both of her legs (chronic). No other new symptoms but her back pain is bothering her a lot tonight.  She is asking for refill on Toradol p.o.   Assessment / Plan / Recommendations:   Chronic back pain: She she was given p.o. Toradol and states that it works for her pain very well.  Denies any GI side effects or any other issues with that.  Medical record reviewed.  Appropriate to give her mother short course of p.o. ketorolac to be taken as needed. She will make it to appointment with neurosurgery for further management.  As always, pt is advised that if symptoms worsen or new symptoms arise, they should go to an urgent care facility or to to ER for further evaluation.   Mary Pretty, MD   03/15/2021, 8:24 PM

## 2021-03-15 NOTE — BH Specialist Note (Signed)
Integrated Behavioral Health via Telemedicine Visit  03/15/2021 HLEE FRINGER [id number]  Number of Integrated Behavioral Health visits: 5/6 Session Start time: 9:30am  Session End time: 10:00am Total time: 30  Referring Provider: Dr. Oswaldo Done, MD Patient/Family location: Pt at home in private w/BF moving in & out of room [facility] Provider location: Working remotely in private All persons participating in visit: Pt & Clinician Types of Service: Individual psychotherapy  I connected with [name] and/or Brand Males Disch's self via  Telephone or Video Enabled Telemedicine Application  (Video is Caregility application) and verified that I am speaking with the correct person using two identifiers. Discussed confidentiality: Yes   I discussed the limitations of telemedicine and the availability of in person appointments.  Discussed there is a possibility of technology failure and discussed alternative modes of communication if that failure occurs.  I discussed that engaging in this telemedicine visit, they consent to the provision of behavioral healthcare and the services will be billed under their insurance.  Patient and/or legal guardian expressed understanding and consented to Telemedicine visit: Yes   Presenting Concerns: Patient and/or family reports the following  symptoms/concerns: Pt c/o cont'd yeast inf in vaginal area & underneath breast bilaterally; Pt is at the conclusion of her script for this lasting 7 days since last week. Pt is due for labwork on Wed @ 9:00am. Pt pains are mostly of a Px nature. She did briefly discuss the issues she is having in her relationship w/her BF Jillyn Hidden. We agreed to a Cpl Session next visit @ the conclusion of this session. Duration of problem: weeks for Px issues, & months for relational concerns; Severity of problem: mild to moderate  Patient and/or Family's Strengths/Protective Factors: Social connections and Pt has few resources & c/o poor housing  cond w/her friends' lending them a room that consists of a small space, but limited privacy. Pt admits she is somewhat of a hoarder & it is bad situation in this small space.  Goals Addressed: Patient will: 1.  Reduce symptoms of: anxiety, compulsions, obsessions and stress  2.  Increase knowledge and/or ability of: coping skills, healthy habits and stress reduction  3.  Demonstrate ability to: Increase healthy adjustment to current life circumstances and Increase motivation to adhere to plan of care  Progress towards Goals: Ongoing  Interventions: Interventions utilized:  Motivational Interviewing and Behavioral Activation Standardized Assessments completed: Not Needed  Patient and/or Family Response: Pt is receptive to call w/consistent Px complaints of pain & discomfort  Assessment: Patient currently experiencing elevated anx/dep due to housing circumstances & relational concerns.   Patient may benefit from Tx of OCD & psychoedu re: hoarding tendencies, relational dynamics, & Family system issues.  Plan: 1. Follow up with behavioral health clinician on : 2-3 wks for 60 min telehealth for the Cpl 2. Behavioral recommendations: Begin to prioritize belongings to eventually purge those items that are not essential to Pt tolerance. 3. Referral(s): Integrated Behavioral Health Services (In Clinic), & local OCD Specialist  I discussed the assessment and treatment plan with the patient and/or parent/guardian. They were provided an opportunity to ask questions and all were answered. They agreed with the plan and demonstrated an understanding of the instructions.   They were advised to call back or seek an in-person evaluation if the symptoms worsen or if the condition fails to improve as anticipated.  Deneise Lever, LMFT

## 2021-03-15 NOTE — Telephone Encounter (Signed)
RN notified that Dr. Monna Fam is working remotely this week.  TC to patient and she was notified Dr. Monna Fam will call her for her appt.  Patient's lab only appt was rescheduled for tomorrow morning at 0900. SChaplin, RN,BSN

## 2021-03-16 ENCOUNTER — Telehealth: Payer: Self-pay

## 2021-03-16 NOTE — Telephone Encounter (Signed)
I agree. Just stop the metronidazole and we will evaluate tomorrow.

## 2021-03-16 NOTE — Telephone Encounter (Signed)
Agree with Dr. Nedra Hai, will assess tomorrow!

## 2021-03-16 NOTE — Telephone Encounter (Signed)
Received TC from patient who states the Metronidazole she was placed on at her LOV on 03/10/21 is causing N/V and she cannot take anymore tablets.  She states she has 4 tabs left.  She is still c/o of vaginal irritation, brown discharge w/ odor, and itching.  She also states she was evaluated at LOV for a rash under bilateral breasts, she now c/o pain under the right breast. RN informed patient she needs to come in for an evaluation.  Pt is requesting an appt w/ female MD.  Pt has an upcoming lab only appt tomorrow morning , f/u appt given at 1045 w/ Dr. Karilyn Cota for evaluation.    Pt is requesting antiemetic since the metronidazole has her so nauseated.  Pt states she has not consumed any alcoholic beverages while taking this medication.  RN informed patient she will send this request to MD, but MD may want to wait until evaluation tomorrow.  Will forward to PCP and blue team. Thank you, SChaplin, RN,BSN

## 2021-03-17 ENCOUNTER — Encounter: Payer: Self-pay | Admitting: Student in an Organized Health Care Education/Training Program

## 2021-03-17 ENCOUNTER — Encounter: Payer: Medicaid Other | Admitting: Internal Medicine

## 2021-03-17 NOTE — Telephone Encounter (Signed)
Pt was a "no show" for appt that was scheduled for 1045 today.Mary Spittle Cassady4/28/202211:57 AM

## 2021-03-18 ENCOUNTER — Other Ambulatory Visit (INDEPENDENT_AMBULATORY_CARE_PROVIDER_SITE_OTHER): Payer: Medicare HMO

## 2021-03-18 DIAGNOSIS — E785 Hyperlipidemia, unspecified: Secondary | ICD-10-CM | POA: Diagnosis not present

## 2021-03-18 DIAGNOSIS — E871 Hypo-osmolality and hyponatremia: Secondary | ICD-10-CM | POA: Diagnosis not present

## 2021-03-19 ENCOUNTER — Ambulatory Visit (HOSPITAL_COMMUNITY)
Admission: EM | Admit: 2021-03-19 | Discharge: 2021-03-19 | Disposition: A | Discharge status: Home / Self Care | Payer: Medicare HMO | Attending: Medical Oncology | Admitting: Medical Oncology

## 2021-03-19 ENCOUNTER — Encounter (HOSPITAL_COMMUNITY): Payer: Self-pay

## 2021-03-19 ENCOUNTER — Other Ambulatory Visit: Payer: Self-pay

## 2021-03-19 DIAGNOSIS — M5441 Lumbago with sciatica, right side: Secondary | ICD-10-CM | POA: Insufficient documentation

## 2021-03-19 DIAGNOSIS — N898 Other specified noninflammatory disorders of vagina: Secondary | ICD-10-CM | POA: Diagnosis not present

## 2021-03-19 DIAGNOSIS — G8929 Other chronic pain: Secondary | ICD-10-CM | POA: Diagnosis not present

## 2021-03-19 DIAGNOSIS — M5442 Lumbago with sciatica, left side: Secondary | ICD-10-CM | POA: Diagnosis not present

## 2021-03-19 LAB — LIPID PANEL
Chol/HDL Ratio: 5.2 ratio — ABNORMAL HIGH (ref 0.0–4.4)
Cholesterol, Total: 188 mg/dL (ref 100–199)
HDL: 36 mg/dL — ABNORMAL LOW (ref >39)
LDL Chol Calc (NIH): 106 mg/dL — ABNORMAL HIGH (ref 0–99)
Triglycerides: 268 mg/dL — ABNORMAL HIGH (ref 0–149)
VLDL Cholesterol Cal: 46 mg/dL — ABNORMAL HIGH (ref 5–40)

## 2021-03-19 LAB — TSH: TSH: 2.56 u[IU]/mL (ref 0.450–4.500)

## 2021-03-19 MED ORDER — PREDNISONE 10 MG PO TABS: ORAL_TABLET | ORAL | 0 refills | Status: DC

## 2021-03-19 MED ORDER — FLUCONAZOLE 150 MG PO TABS: 150.0000 mg | ORAL_TABLET | Freq: Every day | ORAL | 0 refills | Status: DC

## 2021-03-19 NOTE — ED Triage Notes (Signed)
Pt present vaginal discharge and pain in both legs. Pt state symptoms started two weeks ago. Pt state she fall in June of last year and possible injured her legs and back

## 2021-03-19 NOTE — ED Provider Notes (Signed)
[facility]    CSN: [id number] Arrival date & time: 03/19/21  1545      History   Chief Complaint Chief Complaint  Patient presents with  . Vaginal Discharge  . Back Pain  . Leg Pain    HPI Mary Gray is a 60 y.o. female.   HPI   Vaginal Discharge: Pt reports unknown color vaginal discharge for the past 2 weeks. Has been prescribed flagyl for BV but she sttaes that this worsened symptoms and she has some intolerance. She had tried vagisel which has helped some. No fevers, dysuria, pelvic pain.   Back Pain: Patient with a history of DDD and the chronic back pain comes in with symptoms of low back pain and some radiating pain to the bilateral legs that has been occurring for months. She has been seen and evaluated for this by multiple individuals including a referral to a neurosurgeon.  She is waiting for them to call her this week.  Most recently she has had Toradol injections along with oral ketorolac, tizanidine, amitriptyline and lidocaine patches.  She states that she still continues to have pain but she is vague on if pain has improved or worsened since she last saw a specialist for this.  She does have a history of DM2 however it is extremely well controlled and her last A1c was 5.92 weeks ago. She reports that in the past she has had some mild incontinence along with some numbness of groin but she reports that none of this is new and that she has had symptoms, discussed symptoms with her specialist and has had an MRI of her back since this time.   Past Medical History:  Diagnosis Date  . Back pain   . Chronic interstitial cystitis    Followed by Dr. Marcine MatarStephen Dahlstedt  . COPD (chronic obstructive pulmonary disease) (HCC)   . DDD (degenerative disc disease), lumbar   . Degeneration of cervical intervertebral disc   . Depression   . Diabetes mellitus, type 2 (HCC) 2007  . Heme positive stool    EGD on 04/08/2010 by Dr. Ewing SchleinMagod showed chronic gastritis and a few  gastric polyps; pathology showed a fundic gland polyp.  Colonoscopy on 04/08/2010 showed small external and internal hemorrhoids, and a few benign-appearing diminutive polyps in the rectum, the distal sigmoid colon, and in the distal descending colon; pathology showed hyperplastic polyps.   . Hypertension   . Hypothyroidism   . Iron deficiency anemia   . Spinal stenosis     Patient Active Problem List   Diagnosis Date Noted  . Hyponatremia 03/04/2021  . Chronic bilateral low back pain without sciatica 12/13/2020  . Sleep disturbance 12/13/2020  . Vaginitis 10/27/2020  . Right ear pain 10/13/2020  . Nausea 02/10/2020  . B12 deficiency 11/25/2018  . Urinary, incontinence, stress female 09/09/2018  . Unilateral primary osteoarthritis, right knee 05/01/2018  . Chronic venous insufficiency 02/26/2017  . Candidal skin infection 03/03/2014  . Preventative health care 05/14/2013  . AVN (avascular necrosis of bone) (HCC) 03/07/2013  . Spinal stenosis of lumbar region 03/01/2011  . Hypothyroidism 07/06/2006  . Diabetes mellitus type 2, uncontrolled (HCC) 07/06/2006  . Hyperlipemia 07/06/2006  . Generalized anxiety disorder 07/06/2006  . Essential hypertension 07/06/2006  . GERD 07/06/2006    Past Surgical History:  Procedure Laterality Date  . CARPAL TUNNEL RELEASE  05/08/2000   By Dr. Katy Fitchobert V. Sypher, Montez HagemanJr.    OB History   No obstetric history on file.  Home Medications    Prior to Admission medications   Medication Sig Start Date End Date Taking? Authorizing Provider  Accu-Chek Softclix Lancets lancets Check blood sugar 1 time per day 07/12/20   Tyson Alias, MD  amitriptyline (ELAVIL) 10 MG tablet TAKE 1 TABLET BY MOUTH EVERYDAY AT BEDTIME 02/04/21   Marcello Fennel, MD  citalopram (CELEXA) 10 MG tablet TAKE 1 TABLET BY MOUTH EVERY DAY 02/21/21   Tyson Alias, MD  [facility] VITAMIN B12 1000 MCG tablet TAKE 1 TABLET BY MOUTH EVERY DAY 01/12/21   Tyson Alias, MD  diclofenac Sodium (VOLTAREN) 1 % GEL Apply 4 g topically 4 (four) times daily. 07/29/20   [provider]  enalapril (VASOTEC) 20 MG tablet Take 1 tablet (20 mg total) by mouth daily. 03/10/21   [name], MD  esomeprazole (NEXIUM) 40 MG capsule TAKE 1 CAPSULE BY MOUTH EVERY DAY 02/25/21   Tyson Alias, MD  fluconazole (DIFLUCAN) 150 MG tablet Take 1 tablet (150 mg total) by mouth daily. Take 1 pill today.  Take the second pill in 3 days. 03/05/21   Claudean Severance, MD  gabapentin (NEURONTIN) 300 MG capsule TAKE 1 CAPSULE BY MOUTH THREE TIMES A DAY 02/14/21   Marcello Fennel, MD  glucose blood (ACCU-CHEK GUIDE) test strip Check blood sugar1 time per day 07/12/20   Tyson Alias, MD  hydrOXYzine (ATARAX/VISTARIL) 25 MG tablet Take 1 tablet (25 mg total) by mouth 2 (two) times daily as needed for anxiety. for anxiety 03/08/21   Tyson Alias, MD  ketorolac (TORADOL) 10 MG tablet Take 1 tablet (10 mg total) by mouth every 6 (six) hours as needed. 03/15/21   Masoudi, Shawna Orleans, MD  levothyroxine (SYNTHROID) 88 MCG tablet TAKE 1 TABLET BY MOUTH EVERY DAY BEFORE BREAKFAST 02/09/21   Tyson Alias, MD  lidocaine (LIDODERM) 5 % PLACE 3 PATCHES ONTO THE SKIN EVERY 12 HOURS. REMOVE AND DISCARD WITHIN 12 HRS OR AS DIRECTED BY MD 01/14/21   Tyson Alias, MD  LORazepam (ATIVAN) 0.5 MG tablet Take one tablet 30 minutes prior to MRI. Can take additional tablet at appointment if needed. 02/03/21   Tyson Alias, MD  metFORMIN (GLUCOPHAGE-XR) 500 MG 24 hr tablet TAKE 2 TABLETS BY MOUTH TWICE A DAY 02/07/21   Tyson Alias, MD  methocarbamol (ROBAXIN) 500 MG tablet TAKE 1 TABLET BY MOUTH TWICE A DAY AS NEEDED 02/14/21   [name], MD  multivitamin-iron-minerals-folic acid (CENTRUM) chewable tablet Chew 1 tablet by mouth daily. 07/12/20   Tyson Alias, MD  neomycin-polymyxin-hydrocortisone (CORTISPORIN) 3.5-10000-1 OTIC  suspension Place 4 drops into both ears 3 (three) times daily. 10/15/20   Eustace Moore, MD  nystatin (NYSTATIN) powder Apply 1 application topically 3 (three) times daily. 03/10/21   [name], MD  ondansetron (ZOFRAN) 4 MG tablet Take 1 tablet (4 mg total) by mouth every 8 (eight) hours as needed for nausea or vomiting. 10/07/20 10/07/21  Albertha Ghee, MD  ropinirole (REQUIP) 5 MG tablet TAKE 1 TABLET BY MOUTH EVERYDAY AT BEDTIME 03/04/21   Stover, Titorya, DPM  tiZANidine (ZANAFLEX) 4 MG tablet TAKE 1 TABLET (4 MG TOTAL) BY MOUTH EVERY 8 (EIGHT) HOURS AS NEEDED FOR MUSCLE SPASMS 03/02/21   Marcello Fennel, MD  traMADol (ULTRAM) 50 MG tablet Take 1 tablet (50 mg total) by mouth daily. 03/05/21   Claudean Severance, MD  pravastatin (PRAVACHOL) 10 MG tablet TAKE 1 TABLET BY MOUTH  EVERY DAY IN THE EVENING 01/16/19 10/15/20  Tyson Alias, MD    Family History Family History  Problem Relation Age of Onset  . Heart attack Father 62  . Diabetes Mother   . Hypertension Mother   . Diabetes Sister   . Breast cancer Neg Hx   . Colon cancer Neg Hx     Social History Social History   Tobacco Use  . Smoking status: Current Every Day Smoker    Packs/day: 0.50    Years: 15.00    Pack years: 7.50    Types: Cigarettes    Last attempt to quit: 01/18/1996    Years since quitting: 25.1  . Smokeless tobacco: Never Used  . Tobacco comment: 5-6 per day  Vaping Use  . Vaping Use: Never used  Substance Use Topics  . Alcohol use: No  . Drug use: No     Allergies   Codeine sulfate, Meperidine and related, Minocycline hcl, Penicillins, Propoxyphene n-acetaminophen, and Tetracycline hcl   Review of Systems Review of Systems  As stated above in HPI Physical Exam Triage Vital Signs ED Triage Vitals [03/19/21 1647]  Enc Vitals Group     BP 120/70     Pulse Rate 72     Resp 16     Temp 98.5 F (36.9 C)     Temp Source Oral     SpO2 96 %     Weight      Height      Head  Circumference      Peak Flow      Pain Score 10     Pain Loc      Pain Edu?      Excl. in GC?    No data found.  Updated Vital Signs BP 120/70 (BP Location: Right Arm)   Pulse 72   Temp 98.5 F (36.9 C) (Oral)   Resp 16   LMP 01/17/2009   SpO2 96%   Physical Exam Vitals and nursing note reviewed.  Constitutional:      General: She is not in acute distress.    Appearance: Normal appearance. She is obese. She is not ill-appearing, toxic-appearing or diaphoretic.     Comments: Of note pt is in wheelchair in office but ambulates out to nurse desk with ease while waiting on paperwork  Musculoskeletal:     Comments: No midline tenderness of neck and spine. Tenderness to palpation of back muscles throughout. Lower extremity strength 5/5 bilaterally with intact sensation  Neurological:     Mental Status: She is alert.      UC Treatments / Results  Labs (all labs ordered are listed, but only abnormal results are displayed) Labs Reviewed - No data to display  EKG   Radiology No results found.  Procedures Procedures (including critical care time)  Medications Ordered in UC Medications - No data to display  Initial Impression / Assessment and Plan / UC Course  I have reviewed the triage vital signs and the nursing notes.  Pertinent labs & imaging results that were available during my care of the patient were reviewed by me and considered in my medical decision making (see chart for details).     New.  We spent a large amount of time discussing red flag signs and symptoms for back pain with patient.  We discussed that she has tried most medications available and that I am uncomfortable prescribing a stronger muscle relaxer given her risk for falls and use of a  cane. Instead I have offered a low dose steroid pack for patient. Discussed. She will continue to follow up with her specialists. In terms of her vaginal symptoms I will prescribe diflucan while we await results per  pt preference.    Final Clinical Impressions(s) / UC Diagnoses   Final diagnoses:  None   Discharge Instructions   None    ED Prescriptions    None     PDMP not reviewed this encounter.   Rushie Chestnut, [place] 03/19/21 1725

## 2021-03-19 NOTE — ED Notes (Addendum)
Pt stepped out into hall and states wants to speak to provider about inflammation in right breast. Clent Jacks PA notified.

## 2021-03-21 ENCOUNTER — Telehealth: Payer: Self-pay

## 2021-03-21 ENCOUNTER — Encounter: Payer: Medicaid Other | Admitting: Student

## 2021-03-21 LAB — CERVICOVAGINAL ANCILLARY ONLY
Bacterial Vaginitis (gardnerella): NEGATIVE
Candida Glabrata: NEGATIVE
Candida Vaginitis: NEGATIVE
Chlamydia: NEGATIVE
Comment: NEGATIVE
Comment: NEGATIVE
Comment: NEGATIVE
Comment: NEGATIVE
Comment: NEGATIVE
Comment: NORMAL
Neisseria Gonorrhea: NEGATIVE
Trichomonas: NEGATIVE

## 2021-03-21 LAB — CORTISOL-AM, BLOOD: Cortisol - AM: 7 ug/dL (ref 6.2–19.4)

## 2021-03-21 NOTE — Telephone Encounter (Signed)
Return pt's call - "mailbox is full, cannot accept calls st this time".

## 2021-03-21 NOTE — Telephone Encounter (Signed)
Requesting lab results done on 4/29.

## 2021-03-21 NOTE — Telephone Encounter (Signed)
Pt missed a call, pls return call [phone]

## 2021-03-21 NOTE — Telephone Encounter (Signed)
Please tell her the blood work looks ok. Her cholesterol and thyroid are in a good range. Her kidney function is stable, electrolytes are looking better.

## 2021-03-21 NOTE — Telephone Encounter (Signed)
Pt is calling regarding results [phone]

## 2021-03-21 NOTE — Telephone Encounter (Signed)
Pt called and informed of Dr Vincent's response "Please tell her the blood work looks ok. Her cholesterol and thyroid are in a good range. Her kidney function is stable, electrolytes are looking better." Pt stated thank-you for calling.

## 2021-03-22 ENCOUNTER — Encounter: Payer: Self-pay | Admitting: Student in an Organized Health Care Education/Training Program

## 2021-03-22 ENCOUNTER — Other Ambulatory Visit: Payer: Self-pay

## 2021-03-22 ENCOUNTER — Telehealth: Payer: Self-pay

## 2021-03-22 ENCOUNTER — Ambulatory Visit
Payer: Medicare HMO | Attending: Student in an Organized Health Care Education/Training Program | Admitting: Student in an Organized Health Care Education/Training Program

## 2021-03-22 VITALS — BP 184/89 | HR 77 | Temp 96.5°F | Resp 16 | Ht 62.0 in | Wt 184.3 lb

## 2021-03-22 DIAGNOSIS — M47816 Spondylosis without myelopathy or radiculopathy, lumbar region: Secondary | ICD-10-CM | POA: Diagnosis not present

## 2021-03-22 DIAGNOSIS — M48062 Spinal stenosis, lumbar region with neurogenic claudication: Secondary | ICD-10-CM | POA: Diagnosis not present

## 2021-03-22 DIAGNOSIS — F17200 Nicotine dependence, unspecified, uncomplicated: Secondary | ICD-10-CM | POA: Diagnosis not present

## 2021-03-22 DIAGNOSIS — M5136 Other intervertebral disc degeneration, lumbar region: Secondary | ICD-10-CM | POA: Diagnosis not present

## 2021-03-22 DIAGNOSIS — G8929 Other chronic pain: Secondary | ICD-10-CM | POA: Diagnosis not present

## 2021-03-22 DIAGNOSIS — M170 Bilateral primary osteoarthritis of knee: Secondary | ICD-10-CM | POA: Insufficient documentation

## 2021-03-22 DIAGNOSIS — G894 Chronic pain syndrome: Secondary | ICD-10-CM | POA: Diagnosis not present

## 2021-03-22 DIAGNOSIS — M1712 Unilateral primary osteoarthritis, left knee: Secondary | ICD-10-CM | POA: Insufficient documentation

## 2021-03-22 DIAGNOSIS — M5416 Radiculopathy, lumbar region: Secondary | ICD-10-CM | POA: Insufficient documentation

## 2021-03-22 DIAGNOSIS — M1711 Unilateral primary osteoarthritis, right knee: Secondary | ICD-10-CM | POA: Diagnosis not present

## 2021-03-22 NOTE — Progress Notes (Signed)
Safety precautions to be maintained throughout the outpatient stay will include: orient to surroundings, keep bed in low position, maintain call bell within reach at all times, provide assistance with transfer out of bed and ambulation.  

## 2021-03-22 NOTE — Progress Notes (Signed)
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 associated with anterolisthesis, facet hypertrophy and uncoverage of the disc.  IMPRESSION: 1. L2-L3 mild to moderate central stenosis and right paracentral disc extrusion that shows caudal migration.  Mild symmetric bilateral foraminal stenosis. 2.  L3-L4 multifactorial moderate central stenosis. Grade 1 anterolisthesis, uncoverage of the disc and calcified broad-based posterior disc bulge.  Bilateral lateral recess stenosis and moderate foraminal stenosis. Foraminal stenosis is most severe at this level and at L5-S1. 3.  L4-L5 moderate central stenosis with central disc extrusion. Caudal migration of disc material in the midline.  Right greater than left lateral recess stenosis.  Minimal foraminal stenosis. 4.  L5-S1 grade 1 anterolisthesis associated with facet arthrosis or possibly due to healed bilateral pars defects.   Moderate bilateral foraminal stenosis.  Original Report Authenticated By: Dereck Ligas, M.D.  DG Knee 2 Views Left  Narrative Clinical Data: Fall with left knee pain  LEFT KNEE - 1-2 VIEW  Comparison: None.  Findings: Two-view exam shows no evidence for fracture .  No joint effusion.  IMPRESSION: No evidence for an acute bony abnormality on this two views study. In the setting of trauma, a four view knee exam is more sensitive for detecting fracture than a two-view study.  Provider: Lurlean Horns  Knee-R DG 3 views: No results found for this or any previous visit.  Knee-L DG 3 views: No results found for this or any previous visit.  Knee-R DG 4 views: Results for orders placed during the hospital encounter of 06/16/20  DG Knee Complete 4 Views Right  Narrative CLINICAL DATA:  Anterior knee pain.  EXAM: RIGHT KNEE - COMPLETE 4+ VIEW  COMPARISON:  None.  FINDINGS: No acute fracture or dislocation. Mild to moderate medial compartment degenerative changes with joint space narrowing and osteophyte formation. Enthesiophyte of the quadriceps tendon insertion on the patella. No area of erosion or osseous destruction. No unexpected radiopaque foreign body. No significant knee joint effusion. Mild soft tissue edema overlying the patella.  IMPRESSION: No acute fracture or dislocation.   Electronically Signed By: Valentino Saxon MD On: 06/17/2020 09:23  Knee-L DG 4 views: Results for orders placed during the hospital encounter of 05/01/18  DG Knee Complete 4 Views Left  Narrative CLINICAL DATA:  Subacute left knee pain.  EXAM: LEFT KNEE - COMPLETE 4+ VIEW  COMPARISON:  Left tibia/fibula radiographs 06/11/2012  FINDINGS: No fracture, dislocation, or knee joint effusion is identified. Mild medial compartment marginal osteophyte formation is noted with at most mild joint space narrowing. No significant lateral or patellofemoral compartment arthropathic  changes are identified. Chronic medial metaphyseal tibial spurring is similar to the prior study. The soft tissues are unremarkable.  IMPRESSION: No acute osseous abnormality. Mild medial compartment osteoarthrosis.   Electronically Signed By: Logan Bores M.D. On: 05/01/2018 14:15    Ankle Imaging: Ankle-R DG Complete: Results for orders placed during the hospital encounter of 02/23/11  DG Ankle Complete Right  Narrative *RADIOLOGY REPORT*  Clinical Data: Status post trauma  RIGHT ANKLE - COMPLETE 3+ VIEW  Comparison: None  Findings: There is no evidence of fracture or dislocation.  There is no evidence of arthropathy or other focal bone abnormality.  Soft tissues are unremarkable.  IMPRESSION: No acute findings.  Original Report Authenticated By: Angelita Ingles, M.D.  Ankle-L DG Complete: Results for orders placed during the hospital encounter of 06/10/18  DG Ankle Complete Left  Narrative CLINICAL DATA:  60 year old female with left ankle pain for 3 months. No injury. Initial encounter.  EXAM: LEFT ANKLE COMPLETE - 3+ VIEW  COMPARISON:  None.  FINDINGS: No fracture or dislocation.  Ankle mortise is intact  Question tiny subchondral cyst tip of medial malleolus.  Minimal spur Achilles tendon insertion site.  No asymmetric soft tissue swelling.  IMPRESSION: Question tiny subchondral cyst tip of medial malleolus.  Minimal spur Achilles tendon insertion site.  Otherwise negative.   Electronically Signed By: Genia Del M.D. On: 06/10/2018 14:12    Complexity Note: Imaging results reviewed. Results shared with Ms. Dethlefs, using Layman's terms.                         ROS  Cardiovascular: Daily Aspirin intake and High blood pressure Pulmonary or Respiratory: Smoking Neurological: No reported neurological signs or symptoms such as seizures, abnormal skin sensations, urinary and/or fecal incontinence, being born with an abnormal open  spine and/or a tethered spinal cord Psychological-Psychiatric: Depressed and Prone to panicking Gastrointestinal: No reported gastrointestinal signs or symptoms such as vomiting or evacuating blood, reflux, heartburn, alternating episodes of diarrhea and constipation, inflamed or scarred liver, or pancreas or irrregular and/or infrequent bowel movements Genitourinary: No reported renal or genitourinary signs or symptoms such as difficulty voiding or producing urine, peeing blood, non-functioning kidney, kidney stones, difficulty emptying the bladder, difficulty controlling the flow of urine, or chronic kidney disease Hematological: No reported hematological signs or symptoms such as prolonged bleeding, low or poor functioning platelets, bruising or bleeding easily, hereditary bleeding problems, low energy levels due to low hemoglobin or being anemic Endocrine: High blood sugar controlled without the use of insulin (NIDDM) Rheumatologic: No reported rheumatological signs and symptoms such as fatigue, joint pain, tenderness, swelling, redness, heat, stiffness, decreased range of motion, with or without associated rash Musculoskeletal: Negative for myasthenia gravis, muscular dystrophy, multiple sclerosis or malignant hyperthermia Work History: Retired  Allergies  Ms. Lute is allergic to codeine sulfate, meperidine and related, minocycline hcl, penicillins, propoxyphene n-acetaminophen, and  tetracycline hcl.  Laboratory Chemistry Profile   Renal Lab Results  Component Value Date   BUN 14 03/10/2021   CREATININE 0.74 03/10/2021   LABCREA 27.33 03/22/2015   BCR 19 03/10/2021   GFRAA >60 02/20/2020   GFRNONAA >60 02/20/2020   SPECGRAV 1.007 03/10/2021   PHUR 7.0 03/10/2021   PROTEINUR Negative 03/10/2021     Electrolytes Lab Results  Component Value Date   NA 127 (L) 03/10/2021   K 5.1 03/10/2021   CL 87 (L) 03/10/2021   CALCIUM 8.6 (L) 03/10/2021     Hepatic Lab Results  Component  Value Date   AST 22 02/20/2020   ALT 19 02/20/2020   ALBUMIN 4.2 02/20/2020   ALKPHOS 75 02/20/2020   LIPASE 24 01/02/2012     ID Lab Results  Component Value Date   HIV NONREACTIVE 02/09/2015   [place] NEGATIVE 02/20/2020   HCVAB NEGATIVE 02/09/2015   PREGTESTUR NEGATIVE 05/01/2013     Bone No results found for: VD25OH, XB284XL2GMW, NU2725DG6, YQ0347QQ5, 25OHVITD1, 25OHVITD2, 25OHVITD3, TESTOFREE, TESTOSTERONE   Endocrine Lab Results  Component Value Date   GLUCOSE 91 03/10/2021   GLUCOSEU Negative 03/10/2021   HGBA1C 5.9 (A) 03/02/2021   TSH 2.560 03/18/2021   FREET4 1.10 04/29/2010     Neuropathy Lab Results  Component Value Date   VITAMINB12 269 09/09/2018   HGBA1C 5.9 (A) 03/02/2021   HIV NONREACTIVE 02/09/2015     CNS No results found for: COLORCSF, APPEARCSF, RBCCOUNTCSF, WBCCSF, POLYSCSF, LYMPHSCSF, EOSCSF, PROTEINCSF, GLUCCSF, JCVIRUS, CSFOLI, IGGCSF, LABACHR, ACETBL, LABACHR, ACETBL   Inflammation (CRP: Acute  ESR: Chronic) Lab Results  Component Value Date   ESRSEDRATE 5 08/12/2015   LATICACIDVEN 1.6 02/20/2020     Rheumatology No results found for: RF, ANA, LABURIC, URICUR, LYMEIGGIGMAB, LYMEABIGMQN, HLAB27   Coagulation Lab Results  Component Value Date   PLT 276 02/20/2020   DDIMER  12/11/2008    0.22        AT THE INHOUSE ESTABLISHED CUTOFF VALUE OF 0.48 ug/mL FEU, THIS ASSAY HAS BEEN DOCUMENTED IN THE LITERATURE TO HAVE A SENSITIVITY AND NEGATIVE PREDICTIVE VALUE OF AT LEAST 98 TO 99%.  THE TEST RESULT SHOULD BE CORRELATED WITH AN ASSESSMENT OF THE CLINICAL PROBABILITY OF DVT / VTE.     Cardiovascular Lab Results  Component Value Date   CKTOTAL 135 01/02/2012   CKMB 1.3 12/12/2008   TROPONINI <0.30 05/02/2013   HGB 13.0 02/20/2020   HCT 38.2 02/20/2020     Screening Lab Results  Component Value Date   SARSCOV2NAA NEGATIVE 02/20/2020   HCVAB NEGATIVE 02/09/2015   HIV NONREACTIVE 02/09/2015   PREGTESTUR NEGATIVE  05/01/2013     Cancer No results found for: CEA, CA125, LABCA2   Allergens No results found for: ALMOND, APPLE, ASPARAGUS, AVOCADO, BANANA, BARLEY, BASIL, BAYLEAF, GREENBEAN, LIMABEAN, WHITEBEAN, BEEFIGE, REDBEET, BLUEBERRY, BROCCOLI, CABBAGE, MELON, CARROT, CASEIN, CASHEWNUT, CAULIFLOWER, CELERY     Note: Lab results reviewed.  PFSH  Drug: Ms. Loiselle  reports no history of drug use. Alcohol:  reports no history of alcohol use. Tobacco:  reports that she has been smoking cigarettes. She has a 7.50 pack-year smoking history. She has never used smokeless tobacco. Medical:  has a past medical history of Back pain, Chronic interstitial cystitis, COPD (chronic obstructive pulmonary disease) ([place]), DDD (degenerative disc disease), lumbar, Degeneration of cervical intervertebral disc, Depression, Diabetes mellitus, type 2 ([place]) (2007), Heme positive stool, Hypertension, Hypothyroidism, Iron deficiency anemia, and Spinal stenosis. Family: family history includes Diabetes in  her mother and sister; Heart attack (age of onset: 61) in her father; Hypertension in her mother.  Past Surgical History:  Procedure Laterality Date  . CARPAL TUNNEL RELEASE  05/08/2000   By Dr. Youlanda Mighty. [place], Brooke Bonito.   Active Ambulatory Problems    Diagnosis Date Noted  . Hypothyroidism 07/06/2006  . Diabetes mellitus type 2, uncontrolled ([place]) 07/06/2006  . Hyperlipemia 07/06/2006  . Generalized anxiety disorder 07/06/2006  . Essential hypertension 07/06/2006  . GERD 07/06/2006  . Lumbar degenerative disc disease 07/06/2006  . Spinal stenosis, lumbar region, with neurogenic claudication 03/01/2011  . AVN (avascular necrosis of bone) ([place]) 03/07/2013  . Preventative health care 05/14/2013  . Candidal skin infection 03/03/2014  . Chronic venous insufficiency 02/26/2017  . Primary osteoarthritis of right knee 05/01/2018  . Urinary, incontinence, stress female 09/09/2018  . B12 deficiency 11/25/2018  . Nausea 02/10/2020   . Right ear pain 10/13/2020  . Vaginitis 10/27/2020  . Chronic bilateral low back pain without sciatica 12/13/2020  . Sleep disturbance 12/13/2020  . Hyponatremia 03/04/2021  . Lumbar spondylosis 03/22/2021  . Chronic radicular lumbar pain 03/22/2021  . Bilateral primary osteoarthritis of knee 03/22/2021  . Primary osteoarthritis of left knee 03/22/2021  . Chronic pain syndrome 03/22/2021   Resolved Ambulatory Problems    Diagnosis Date Noted  . WART, RIGHT HAND 04/21/2009  . OBESITY 07/06/2006  . Iron deficiency anemia 01/03/2010  . ALLERGIC RHINITIS 07/06/2006  . Dyspnea 07/06/2006  . Blood in stool 02/03/2010  . CYSTITIS, CHRONIC INTERSTITIAL 01/05/2009  . IRREGULAR MENSES 04/21/2009  . VAGINITIS, ATROPHIC 01/05/2009  . ACNE, ROSACEA 07/06/2006  . Ankle pain 11/15/2009  . [place] DISEASE, CERVICAL 12/17/2007  . Low back pain with sciatica 07/06/2006  . TRANSAMINASES, SERUM, ELEVATED 02/01/2007  . Right upper quadrant abdominal pain 01/18/2011  . Hyponatremia 03/01/2011  . URI, acute 04/11/2011  . Vaginal yeast infection 05/31/2011  . Drug rash 06/14/2011  . URI (upper respiratory infection) 12/25/2011  . Arthritis of ankle 12/25/2011  . Bilateral leg edema 04/10/2012  . Rash, skin 04/10/2012  . Dyspnea 04/10/2012  . Diabetic neuropathy ([place]) 05/15/2012  . Callus of heel 05/15/2012  . Laceration of left leg 06/11/2012  . Mouth sore 07/02/2012  . Abdominal pain 07/02/2012  . External otitis of right ear 07/04/2012  . Rib pain on left side 07/31/2012  . Preoperative examination 07/31/2012  . Hypokalemia 08/02/2012  . Sinusitis 10/23/2012  . Cough 12/04/2012  . Bilateral leg pain 03/05/2013  . Venous stasis dermatitis 04/01/2013  . Viral URI with cough 04/22/2013  . Respiratory distress 05/01/2013  . Acute bacterial rhinosinusitis 05/01/2013  . Asthma with acute exacerbation 05/03/2013  . External otitis 05/14/2013  . Callus of foot 05/14/2013  . Oral ulcer  05/14/2013  . Fatigue 06/25/2013  . Right arm pain 03/03/2014  . Rib pain on right side 09/29/2014  . Urge incontinence of urine 10/01/2014  . Chronic laryngitis 08/12/2015  . Bursitis of left shoulder 02/07/2016  . Chronic use of opiate for therapeutic purpose 02/08/2016  . Low back pain 02/08/2016  . Inclusion cyst 04/14/2016  . Facial lesion 04/14/2016  . Healthcare maintenance 04/14/2016  . Vulvovaginal candidiasis 07/03/2016  . URI with cough and congestion 08/15/2016  . Calf tenderness 05/04/2017  . Fall at home 06/16/2020   Past Medical History:  Diagnosis Date  . Back pain   . COPD (chronic obstructive pulmonary disease) ([place])   . DDD (degenerative disc disease), lumbar   . Depression   .  Diabetes mellitus, type 2 ([place]) 2007  . Heme positive stool   . Hypertension   . Spinal stenosis    Constitutional Exam  General appearance: Well nourished, well developed, and well hydrated. In no apparent acute distress Vitals:   03/22/21 0935  BP: (!) 184/89  Pulse: 77  Resp: 16  Temp: (!) 96.5 F (35.8 C)  TempSrc: Temporal  SpO2: 98%  Weight: 184 lb 4.8 oz (83.6 kg)  Height: 5' 2"  (1.575 m)   BMI Assessment: Estimated body mass index is 33.71 kg/m as calculated from the following:   Height as of this encounter: 5' 2"  (1.575 m).   Weight as of this encounter: 184 lb 4.8 oz (83.6 kg).  BMI interpretation table: BMI level Category Range association with higher incidence of chronic pain  <18 kg/m2 Underweight   18.5-24.9 kg/m2 Ideal body weight   25-29.9 kg/m2 Overweight Increased incidence by 20%  30-34.9 kg/m2 Obese (Class I) Increased incidence by 68%  35-39.9 kg/m2 Severe obesity (Class II) Increased incidence by 136%  >40 kg/m2 Extreme obesity (Class III) Increased incidence by 254%   Patient's current BMI Ideal Body weight  Body mass index is 33.71 kg/m. Ideal body weight: 50.1 kg (110 lb 7.2 oz) Adjusted ideal body weight: 63.5 kg (139 lb 15.8 oz)   BMI  Readings from Last 4 Encounters:  03/22/21 33.71 kg/m  03/10/21 33.80 kg/m  03/03/21 38.73 kg/m  03/02/21 38.85 kg/m   Wt Readings from Last 4 Encounters:  03/22/21 184 lb 4.8 oz (83.6 kg)  03/10/21 184 lb 12.8 oz (83.8 kg)  03/03/21 205 lb (93 kg)  03/02/21 205 lb 9.6 oz (93.3 kg)    Psych/Mental status: Alert, oriented x 3 (person, place, & time)       Eyes: PERLA Respiratory: No evidence of acute respiratory distress  Thoracic Spine Area Exam  Skin & Axial Inspection: No masses, redness, or swelling Alignment: Symmetrical Functional ROM: Pain restricted ROM Stability: No instability detected Muscle Tone/Strength: Functionally intact. No obvious neuro-muscular anomalies detected. Sensory (Neurological): Musculoskeletal pain pattern Muscle strength & Tone: No palpable anomalies Lumbar Spine Area Exam  Skin & Axial Inspection: No masses, redness, or swelling Alignment: Symmetrical Functional ROM: Pain restricted ROM       Stability: No instability detected Muscle Tone/Strength: Functionally intact. No obvious neuro-muscular anomalies detected. Sensory (Neurological): Neurogenic pain pattern Palpation: No palpable anomalies       Provocative Tests: Hyperextension/rotation test: (+) bilaterally for facet joint pain. Lumbar quadrant test (Kemp's test): (+) bilateral for foraminal stenosis  Gait & Posture Assessment  Ambulation: Patient ambulates using a cane Gait: Antalgic Posture: Difficulty standing up straight, due to pain  Lower Extremity Exam    Side: Right lower extremity  Side: Left lower extremity  Stability: No instability observed          Stability: No instability observed          Skin & Extremity Inspection: Skin color, temperature, and hair growth are WNL. No peripheral edema or cyanosis. No masses, redness, swelling, asymmetry, or associated skin lesions. No contractures.  Skin & Extremity Inspection: Skin color, temperature, and hair growth are WNL. No  peripheral edema or cyanosis. No masses, redness, swelling, asymmetry, or associated skin lesions. No contractures.  Functional ROM: Pain restricted ROM for all joints of the lower extremity          Functional ROM: Pain restricted ROM for all joints of the lower extremity  Muscle Tone/Strength: Functionally intact. No obvious neuro-muscular anomalies detected.  Muscle Tone/Strength: Functionally intact. No obvious neuro-muscular anomalies detected.  Sensory (Neurological): Neurogenic pain pattern        Sensory (Neurological): Neurogenic pain pattern        DTR: Patellar: deferred today Achilles: deferred today Plantar: deferred today  DTR: Patellar: deferred today Achilles: deferred today Plantar: deferred today  Palpation: No palpable anomalies  Palpation: No palpable anomalies    Assessment  Primary Diagnosis & Pertinent Problem List: The primary encounter diagnosis was Spinal stenosis, lumbar region, with neurogenic claudication. Diagnoses of Lumbar facet arthropathy, Lumbar spondylosis, Chronic radicular lumbar pain, Lumbar degenerative disc disease, Bilateral primary osteoarthritis of knee, Primary osteoarthritis of right knee, Primary osteoarthritis of left knee, and Chronic pain syndrome were also pertinent to this visit.  Visit Diagnosis (New problems to examiner): 1. Spinal stenosis, lumbar region, with neurogenic claudication   2. Lumbar facet arthropathy   3. Lumbar spondylosis   4. Chronic radicular lumbar pain   5. Lumbar degenerative disc disease   6. Bilateral primary osteoarthritis of knee   7. Primary osteoarthritis of right knee   8. Primary osteoarthritis of left knee   9. Chronic pain syndrome    Plan of Care (Initial workup plan)   1.  Low back pain that is multifactorial related to lumbar spinal stenosis with neurogenic claudication, advanced lumbar facet arthropathy as well as neuroforaminal stenosis bilaterally.  Patient has tried physical therapy  and has failed conservative medication management as detailed above.  She has had an epidural steroid injection over 5 years ago.  Does not recall how she responded but states that her pain is much worse now than it was before.  Recommend series of diagnostic/therapeutic lumbar epidural steroid injections.  I reviewed the patient's lumbar MRI with her and detailed and used a spine model to explain the extent of her spinal stenosis and associated facet arthropathy.  Patient has textbook symptoms of bilateral leg pain, heaviness that worsens with walking and that improves with sitting down and bending forward.  She does have ligamentum flavum hypertrophy throughout her lumbar spine most pronounced at L3-L4 and L4-L5 with her ligamentum flavum measuring approximately 7.5 mm on both sides.  Patient could be a candidate for mild, minimally invasive lumbar decompression for spinal canal stenosis.  We will see how she does with diagnostic lumbar epidural injection series and if limited response, will discuss mild further.  2.  Bilateral knee pain related to knee osteoarthritis.  Discussed genicular nerve block and possible radiofrequency ablation.  Patient states that she will think about this further.  As needed order placed.  3. Patient has chronic pain. Studies show that people with chronic pain tend to be heavier smokers and often smoke in response to pain. Chronic pain makes smoking cessation more difficult and increases the risk of relapse after quitting.  Pt  and I discussed the importance of managing pain during the process of smoking cessation.  We have developed a plan for smoking cessation and relapse prevention that includes management of of smoking in response to pain.   Lab Orders     Compliance Drug Analysis, Ur  Procedure Orders     Lumbar Epidural Injection     GENICULAR NERVE BLOCK  Interventional management options: Ms. Milberger was informed that there is no guarantee that she would be a  candidate for interventional therapies. The decision will be based on the results of diagnostic studies, as well as Ms. Rzepka's risk  profile.  Procedure(s) under consideration:  Lumbar epidural steroid injection Diagnostic lumbar facet medial branch nerve blocks, lumbar RFA Mild: Minimally invasive lumbar decompression for spinal stenosis with neurogenic claudication Genicular nerve block, genicular nerve RFA for knee osteoarthritis Intra-articular Hyalgan for knee osteoarthritis.   Provider-requested follow-up: Return in about 2 weeks (around 04/05/2021) for L4/5 ESI + PO Valium.  Future Appointments  Date Time Provider [place]  03/28/2021 10:40 AM Jamse Arn, MD [facility]  04/08/2021 10:45 AM Jose Persia, MD [facility]  04/12/2021  2:00 PM Donnetta Hutching, LMFT [facility]    Note by: Gillis Santa, MD Date: 03/22/2021; Time: 11:25 AM

## 2021-03-22 NOTE — Patient Instructions (Signed)
Moderate Conscious Sedation, Adult Sedation is the use of medicines to promote relaxation and to relieve discomfort and anxiety. Moderate conscious sedation is a type of sedation. Under moderate conscious sedation, you are less alert than normal, but you are still able to respond to instructions, touch, or both. Moderate conscious sedation is used during short medical and dental procedures. It is milder than deep sedation, which is a type of sedation under which you cannot be easily woken up. It is also milder than general anesthesia, which is the use of medicines to make you unconscious. Moderate conscious sedation allows you to return to your regular activities sooner. Tell a health care provider about:  Any allergies you have.  All medicines you are taking, including vitamins, herbs, eye drops, creams, and over-the-counter medicines.  Any use of steroids. This includes steroids taken by mouth or as a cream.  Any problems you or family members have had with sedatives and anesthetic medicines.  Any blood disorders you have.  Any surgeries you have had.  Any medical conditions you have, such as sleep apnea.  Whether you are pregnant or may be pregnant.  Any use of cigarettes, alcohol, marijuana, or drugs. What are the risks? Generally, this is a safe procedure. However, problems may occur, including:  Getting too much medicine (oversedation).  Nausea.  Allergic reaction to medicines.  Trouble breathing. If this happens, a breathing tube may be used. It will be removed when you are awake and breathing on your own.  Heart trouble.  Lung trouble.  Confusion that gets better with time (emergence delirium). What happens before the procedure? Staying hydrated Follow instructions from your health care provider about hydration, which may include:  Up to 2 hours before the procedure - you may continue to drink clear liquids, such as water, clear fruit juice, black coffee, and plain  tea. Eating and drinking restrictions Follow instructions from your health care provider about eating and drinking, which may include:  8 hours before the procedure - stop eating heavy meals or foods, such as meat, fried foods, or fatty foods.  6 hours before the procedure - stop eating light meals or foods, such as toast or cereal.  6 hours before the procedure - stop drinking milk or drinks that contain milk.  2 hours before the procedure - stop drinking clear liquids. Medicines Ask your health care provider about:  Changing or stopping your regular medicines. This is especially important if you are taking diabetes medicines or blood thinners.  Taking medicines such as aspirin and ibuprofen. These medicines can thin your blood. Do not take these medicines unless your health care provider tells you to take them.  Taking over-the-counter medicines, vitamins, herbs, and supplements. Tests and exams  You will have a physical exam.  You may have blood tests done to show how well: ? Your kidneys and liver work. ? Your blood clots. General instructions  Plan to have a responsible adult take you home from the hospital or clinic.  If you will be going home right after the procedure, plan to have a responsible adult care for you for the time you are told. This is important. What happens during the procedure?  You will be given the sedative. The sedative may be given: ? As a pill that you will swallow. It can also be inserted into the rectum. ? As a spray through the nose. ? As an injection into the muscle. ? As an injection into the vein through an IV.  You may be given oxygen as needed.  Your breathing, heart rate, and blood pressure will be monitored during the procedure.  The medical or dental procedure will be done. The procedure may vary among health care providers and hospitals.   What happens after the procedure?  Your blood pressure, heart rate, breathing rate, and  blood oxygen level will be monitored until you leave the hospital or clinic.  You will get fluids through your IV if needed.  Do not drive or operate machinery until your health care provider says that it is safe. Summary  Sedation is the use of medicines to promote relaxation and to relieve discomfort and anxiety. Moderate conscious sedation is a type of sedation that is used during short medical and dental procedures.  Tell the health care provider about any medical conditions that you have and about all the medicines that you are taking.  You will be given the sedative as a pill, a spray through the nose, an injection into the muscle, or an injection into the vein through an IV. Vital signs are monitored during the sedation.  Moderate conscious sedation allows you to return to your regular activities sooner. This information is not intended to replace advice given to you by your health care provider. Make sure you discuss any questions you have with your health care provider. Document Revised: 03/05/2020 Document Reviewed: 10/02/2019 Elsevier Patient Education  2021 Rosalie  What are the risk, side effects and possible complications? Generally speaking, most procedures are safe.  However, with any procedure there are risks, side effects, and the possibility of complications.  The risks and complications are dependent upon the sites that are lesioned, or the type of nerve block to be performed.  The closer the procedure is to the spine, the more serious the risks are.  Great care is taken when placing the radio frequency needles, block needles or lesioning probes, but sometimes complications can occur. 1. Infection: Any time there is an injection through the skin, there is a risk of infection.  This is why sterile conditions are used for these blocks.  There are four possible types of infection. 1. Localized skin infection. 2. Central Nervous System  Infection-This can be in the form of Meningitis, which can be deadly. 3. Epidural Infections-This can be in the form of an epidural abscess, which can cause pressure inside of the spine, causing compression of the spinal cord with subsequent paralysis. This would require an emergency surgery to decompress, and there are no guarantees that the patient would recover from the paralysis. 4. Discitis-This is an infection of the intervertebral discs.  It occurs in about 1% of discography procedures.  It is difficult to treat and it may lead to surgery.        2. Pain: the needles have to go through skin and soft tissues, will cause soreness.       3. Damage to internal structures:  The nerves to be lesioned may be near blood vessels or    other nerves which can be potentially damaged.       4. Bleeding: Bleeding is more common if the patient is taking blood thinners such as  aspirin, Coumadin, Ticiid, Plavix, etc., or if he/she have some genetic predisposition  such as hemophilia. Bleeding into the spinal canal can cause compression of the spinal  cord with subsequent paralysis.  This would require an emergency surgery to  decompress and there are no guarantees that the  patient would recover from the  paralysis.       5. Pneumothorax:  Puncturing of a lung is a possibility, every time a needle is introduced in  the area of the chest or upper back.  Pneumothorax refers to free air around the  collapsed lung(s), inside of the thoracic cavity (chest cavity).  Another two possible  complications related to a similar event would include: Hemothorax and Chylothorax.   These are variations of the Pneumothorax, where instead of air around the collapsed  lung(s), you may have blood or chyle, respectively.       6. Spinal headaches: They may occur with any procedures in the area of the spine.       7. Persistent CSF (Cerebro-Spinal Fluid) leakage: This is a rare problem, but may occur  with prolonged intrathecal or  epidural catheters either due to the formation of a fistulous  track or a dural tear.       8. Nerve damage: By working so close to the spinal cord, there is always a possibility of  nerve damage, which could be as serious as a permanent spinal cord injury with  paralysis.       9. Death:  Although rare, severe deadly allergic reactions known as "Anaphylactic  reaction" can occur to any of the medications used.      10. Worsening of the symptoms:  We can always make thing worse.  What are the chances of something like this happening? Chances of any of this occuring are extremely low.  By statistics, you have more of a chance of getting killed in a motor vehicle accident: while driving to the hospital than any of the above occurring .  Nevertheless, you should be aware that they are possibilities.  In general, it is similar to taking a shower.  Everybody knows that you can slip, hit your head and get killed.  Does that mean that you should not shower again?  Nevertheless always keep in mind that statistics do not mean anything if you happen to be on the wrong side of them.  Even if a procedure has a 1 (one) in a 1,000,000 (million) chance of going wrong, it you happen to be that one..Also, keep in mind that by statistics, you have more of a chance of having something go wrong when taking medications.  Who should not have this procedure? If you are on a blood thinning medication (e.g. Coumadin, Plavix, see list of "Blood Thinners"), or if you have an active infection going on, you should not have the procedure.  If you are taking any blood thinners, please inform your physician.  How should I prepare for this procedure?  Do not eat or drink anything at least six hours prior to the procedure.  Bring a driver with you .  It cannot be a taxi.  Come accompanied by an adult that can drive you back, and that is strong enough to help you if your legs get weak or numb from the local anesthetic.  Take all of  your medicines the morning of the procedure with just enough water to swallow them.  If you have diabetes, make sure that you are scheduled to have your procedure done first thing in the morning, whenever possible.  If you have diabetes, take only half of your insulin dose and notify our nurse that you have done so as soon as you arrive at the clinic.  If you are diabetic, but only take blood sugar  pills (oral hypoglycemic), then do not take them on the morning of your procedure.  You may take them after you have had the procedure.  Do not take aspirin or any aspirin-containing medications, at least eleven (11) days prior to the procedure.  They may prolong bleeding.  Wear loose fitting clothing that may be easy to take off and that you would not mind if it got stained with Betadine or blood.  Do not wear any jewelry or perfume  Remove any nail coloring.  It will interfere with some of our monitoring equipment.  NOTE: Remember that this is not meant to be interpreted as a complete list of all possible complications.  Unforeseen problems may occur.  BLOOD THINNERS The following drugs contain aspirin or other products, which can cause increased bleeding during surgery and should not be taken for 2 weeks prior to and 1 week after surgery.  If you should need take something for relief of minor pain, you may take acetaminophen which is found in Tylenol,m Datril, Anacin-3 and Panadol. It is not blood thinner. The products listed below are.  Do not take any of the products listed below in addition to any listed on your instruction sheet.  A.P.C or A.P.C with Codeine Codeine Phosphate Capsules #3 Ibuprofen Ridaura  ABC compound Congesprin Imuran rimadil  Advil Cope Indocin Robaxisal  Alka-Seltzer Effervescent Pain Reliever and Antacid Coricidin or Coricidin-D  Indomethacin Rufen  Alka-Seltzer plus Cold Medicine Cosprin Ketoprofen S-A-C Tablets  Anacin Analgesic Tablets or Capsules Coumadin  Korlgesic Salflex  Anacin Extra Strength Analgesic tablets or capsules CP-2 Tablets Lanoril Salicylate  Anaprox Cuprimine Capsules Levenox Salocol  Anexsia-D Dalteparin Magan Salsalate  Anodynos Darvon compound Magnesium Salicylate Sine-off  Ansaid Dasin Capsules Magsal Sodium Salicylate  Anturane Depen Capsules Marnal Soma  APF Arthritis pain formula Dewitt's Pills Measurin Stanback  Argesic Dia-Gesic Meclofenamic Sulfinpyrazone  Arthritis Bayer Timed Release Aspirin Diclofenac Meclomen Sulindac  Arthritis pain formula Anacin Dicumarol Medipren Supac  Analgesic (Safety coated) Arthralgen Diffunasal Mefanamic Suprofen  Arthritis Strength Bufferin Dihydrocodeine Mepro Compound Suprol  Arthropan liquid Dopirydamole Methcarbomol with Aspirin Synalgos  ASA tablets/Enseals Disalcid Micrainin Tagament  Ascriptin Doan's Midol Talwin  Ascriptin A/D Dolene Mobidin Tanderil  Ascriptin Extra Strength Dolobid Moblgesic Ticlid  Ascriptin with Codeine Doloprin or Doloprin with Codeine Momentum Tolectin  Asperbuf Duoprin Mono-gesic Trendar  Aspergum Duradyne Motrin or Motrin IB Triminicin  Aspirin plain, buffered or enteric coated Durasal Myochrisine Trigesic  Aspirin Suppositories Easprin Nalfon Trillsate  Aspirin with Codeine Ecotrin Regular or Extra Strength Naprosyn Uracel  Atromid-S Efficin Naproxen Ursinus  Auranofin Capsules Elmiron Neocylate Vanquish  Axotal Emagrin Norgesic Verin  Azathioprine Empirin or Empirin with Codeine Normiflo Vitamin E  Azolid Emprazil Nuprin Voltaren  Bayer Aspirin plain, buffered or children's or timed BC Tablets or powders Encaprin Orgaran Warfarin Sodium  Buff-a-Comp Enoxaparin Orudis Zorpin  Buff-a-Comp with Codeine Equegesic Os-Cal-Gesic   Buffaprin Excedrin plain, buffered or Extra Strength Oxalid   Bufferin Arthritis Strength Feldene Oxphenbutazone   Bufferin plain or Extra Strength Feldene Capsules Oxycodone with Aspirin   Bufferin with Codeine  Fenoprofen Fenoprofen Pabalate or Pabalate-SF   Buffets II Flogesic Panagesic   Buffinol plain or Extra Strength Florinal or Florinal with Codeine Panwarfarin   Buf-Tabs Flurbiprofen Penicillamine   Butalbital Compound Four-way cold tablets Penicillin   Butazolidin Fragmin Pepto-Bismol   Carbenicillin Geminisyn Percodan   Carna Arthritis Reliever Geopen Persantine   Carprofen Gold's salt Persistin   Chloramphenicol Goody's Phenylbutazone   Chloromycetin Haltrain Piroxlcam  Clmetidine heparin Plaquenil   Cllnoril Hyco-pap Ponstel   Clofibrate Hydroxy chloroquine Propoxyphen         Before stopping any of these medications, be sure to consult the physician who ordered them.  Some, such as Coumadin (Warfarin) are ordered to prevent or treat serious conditions such as "deep thrombosis", "pumonary embolisms", and other heart problems.  The amount of time that you may need off of the medication may also vary with the medication and the reason for which you were taking it.  If you are taking any of these medications, please make sure you notify your pain physician before you undergo any procedures.         Epidural Steroid Injection Patient Information  Description: The epidural space surrounds the nerves as they exit the spinal cord.  In some patients, the nerves can be compressed and inflamed by a bulging disc or a tight spinal canal (spinal stenosis).  By injecting steroids into the epidural space, we can bring irritated nerves into direct contact with a potentially helpful medication.  These steroids act directly on the irritated nerves and can reduce swelling and inflammation which often leads to decreased pain.  Epidural steroids may be injected anywhere along the spine and from the neck to the low back depending upon the location of your pain.   After numbing the skin with local anesthetic (like Novocaine), a small needle is passed into the epidural space slowly.  You may experience a  sensation of pressure while this is being done.  The entire block usually last less than 10 minutes.  Conditions which may be treated by epidural steroids:   Low back and leg pain  Neck and arm pain  Spinal stenosis  Post-laminectomy syndrome  Herpes zoster (shingles) pain  Pain from compression fractures  Preparation for the injection:  1. Do not eat any solid food or dairy products within 8 hours of your appointment.  2. You may drink clear liquids up to 3 hours before appointment.  Clear liquids include water, black coffee, juice or soda.  No milk or cream please. 3. You may take your regular medication, including pain medications, with a sip of water before your appointment  Diabetics should hold regular insulin (if taken separately) and take 1/2 normal NPH dos the morning of the procedure.  Carry some sugar containing items with you to your appointment. 4. A driver must accompany you and be prepared to drive you home after your procedure.  5. Bring all your current medications with your. 6. An IV may be inserted and sedation may be given at the discretion of the physician.   7. A blood pressure cuff, EKG and other monitors will often be applied during the procedure.  Some patients may need to have extra oxygen administered for a short period. 8. You will be asked to provide medical information, including your allergies, prior to the procedure.  We must know immediately if you are taking blood thinners (like Coumadin/Warfarin)  Or if you are allergic to IV iodine contrast (dye). We must know if you could possible be pregnant.  Possible side-effects:  Bleeding from needle site  Infection (rare, may require surgery)  Nerve injury (rare)  Numbness & tingling (temporary)  Difficulty urinating (rare, temporary)  Spinal headache ( a headache worse with upright posture)  Light -headedness (temporary)  Pain at injection site (several days)  Decreased blood pressure  (temporary)  Weakness in arm/leg (temporary)  Pressure sensation in back/neck (temporary)  Call  if you experience:  Fever/chills associated with headache or increased back/neck pain.  Headache worsened by an upright position.  New onset weakness or numbness of an extremity below the injection site  Hives or difficulty breathing (go to the emergency room)  Inflammation or drainage at the infection site  Severe back/neck pain  Any new symptoms which are concerning to you  Please note:  Although the local anesthetic injected can often make your back or neck feel good for several hours after the injection, the pain will likely return.  It takes 3-7 days for steroids to work in the epidural space.  You may not notice any pain relief for at least that one week.  If effective, we will often do a series of three injections spaced 3-6 weeks apart to maximally decrease your pain.  After the initial series, we generally will wait several months before considering a repeat injection of the same type.  If you have any questions, please call [phone] [facility]

## 2021-03-22 NOTE — Telephone Encounter (Signed)
Pt asks if she can get a medication for her pain

## 2021-03-22 NOTE — Telephone Encounter (Signed)
Patient called and is asking for pain medication.  She was in today for visit and it slipped her mind about asking at that time.  I told her I would send a message to DR [facility] and let her know tomorrow.

## 2021-03-23 ENCOUNTER — Other Ambulatory Visit: Payer: Self-pay

## 2021-03-23 ENCOUNTER — Telehealth: Payer: Self-pay | Admitting: Internal Medicine

## 2021-03-23 ENCOUNTER — Telehealth: Payer: Self-pay

## 2021-03-23 DIAGNOSIS — M545 Low back pain, unspecified: Secondary | ICD-10-CM

## 2021-03-23 DIAGNOSIS — G8929 Other chronic pain: Secondary | ICD-10-CM

## 2021-03-23 DIAGNOSIS — M62830 Muscle spasm of back: Secondary | ICD-10-CM

## 2021-03-23 DIAGNOSIS — M48061 Spinal stenosis, lumbar region without neurogenic claudication: Secondary | ICD-10-CM

## 2021-03-23 MED ORDER — KETOROLAC TROMETHAMINE 10 MG PO TABS: 10.0000 mg | ORAL_TABLET | Freq: Four times a day (QID) | ORAL | 0 refills | Status: DC | PRN

## 2021-03-23 NOTE — Telephone Encounter (Signed)
Pt is requesting a call back she stated that she is having back pains and would like to know if her pcp can send her in some medicine

## 2021-03-23 NOTE — Progress Notes (Signed)
  [facility] Residency Telephone Encounter Continuity Care Appointment  HPI:   This telephone encounter was created for [name] on 03/23/2021 for the following purpose/cc Chronic Back Pain.   Past Medical History:  Past Medical History:  Diagnosis Date  . Back pain   . Chronic interstitial cystitis    Followed by Dr. Marcine Matar  . COPD (chronic obstructive pulmonary disease) (HCC)   . DDD (degenerative disc disease), lumbar   . Degeneration of cervical intervertebral disc   . Depression   . Diabetes mellitus, type 2 (HCC) 2007  . Heme positive stool    EGD on 04/08/2010 by Dr. Ewing Schlein showed chronic gastritis and a few gastric polyps; pathology showed a fundic gland polyp.  Colonoscopy on 04/08/2010 showed small external and internal hemorrhoids, and a few benign-appearing diminutive polyps in the rectum, the distal sigmoid colon, and in the distal descending colon; pathology showed hyperplastic polyps.   . Hypertension   . Hypothyroidism   . Iron deficiency anemia   . Spinal stenosis       ROS:  Review of Systems  Constitutional: Negative for chills, fever and weight loss.  Gastrointestinal: Negative for abdominal pain, heartburn, nausea and vomiting.  Musculoskeletal: Positive for back pain.     Assessment / Plan / Recommendations:   Patient calls the after hours pager for her chronic back pain. She has an appointment for a spinal injection that was moved to 04/11/2021 and is currently experiencing pain from her chronic back/spinal stenosis. She has tried to reach out to her pain specialist for pain control. She requests ketorolac for pain control as it has helped in the past.   Patient requesting medication for her back pain. There is a telephone encounter showing that Dr. Cherylann Ratel would not prescribe opioid medications at this time. Will give short course of Ketorolac.   As always, pt is advised that if symptoms worsen or new symptoms arise,  they should go to an urgent care facility or to to ER for further evaluation.

## 2021-03-23 NOTE — Telephone Encounter (Signed)
Spoke with Dr Cherylann Ratel and he is going to focusing on interventional therapies at this time.  No medication regimen at this time.  Need to let patient know.

## 2021-03-23 NOTE — Telephone Encounter (Signed)
Pt is requesting her lidocaine (LIDODERM) 5 % sent to  [id number] [place], [place] - [address] Phone:  [phone]  Fax:  [phone]

## 2021-03-23 NOTE — Telephone Encounter (Signed)
Called patient to let her know that Dr Cherylann Ratel would not be prescribing any opioid medications at this time. Patient asked what is she supposed to do, she can't get around her house.  I told her she could call the provider who prescribed Tramadol previously. Or she could rely on tylenol or ibuprofen if she is able to tolerate it.  I did give her the information as to who prescribed the Tramadol back in April.  Also, Alona Bene will be calling her when she gets PA in.

## 2021-03-23 NOTE — Telephone Encounter (Signed)
She has called twice since her new patient visit, asking for pain medicine. I am in the process of getting auth for procedure.

## 2021-03-24 ENCOUNTER — Telehealth: Payer: Self-pay

## 2021-03-24 MED ORDER — LIDOCAINE 5 % EX PTCH: MEDICATED_PATCH | CUTANEOUS | 1 refills | Status: DC

## 2021-03-24 NOTE — Telephone Encounter (Signed)
There are no easy solutions here, no medicine that I can call in will safely meet her expectation. She consulted with [facility] earlier this week, and it seemed they had a nice plan moving forward. I would be happy to talk with her and give my opinion, but due to the complexity of her case it would need to be within a visit.

## 2021-03-24 NOTE — Telephone Encounter (Signed)
Chronic low back pain should be ok to wait until our visit on on the 16th. If something has changed, and she needs to be seen sooner, she can make an acute visit appointment with a resident.

## 2021-03-24 NOTE — Telephone Encounter (Signed)
Called /informed lidocaine patches have been filled (today).  I asked about [facility]- stated she did go and plan is for steroid injections but she needs something "to take this pain away".  Stated she cannot walk nor eat; "I'm only 60 yrs old".Informed pt her doctor will discuss this at a visit; she's agreeable. Front office stated first available appt with Dr Oswaldo Done is May 16th - she stated she cannot wait until then. Please advise.

## 2021-03-24 NOTE — Telephone Encounter (Signed)
RTC, VM obtained, VM-box is full and nurse is unable to leave a message. SChaplin, RN,BSN

## 2021-03-24 NOTE — Telephone Encounter (Signed)
Requesting refill on lidocaine patch, @  [id number] [address] [address], [place] - [address] AT [address] Phone:  [phone]  Fax:  [phone]     Pt states she is in pain, requesting to speak with Dr. Oswaldo Done.

## 2021-03-24 NOTE — Telephone Encounter (Signed)
Called and talked to pt - Stated she's willing to see Dr Oswaldo Done on May 16th and see one of the residents on Monday. No available appts on Monday 5/9; appt has been scheduled on Wed 5/11 with Dr Barbaraann Faster but informed to call to check on any cancellations for Monday.

## 2021-03-25 ENCOUNTER — Other Ambulatory Visit: Payer: Self-pay | Admitting: Physical Medicine & Rehabilitation

## 2021-03-28 ENCOUNTER — Encounter: Payer: Medicare HMO | Admitting: Physical Medicine & Rehabilitation

## 2021-03-30 ENCOUNTER — Encounter: Payer: Medicare HMO | Admitting: Internal Medicine

## 2021-03-30 LAB — COMPLIANCE DRUG ANALYSIS, UR

## 2021-03-31 ENCOUNTER — Other Ambulatory Visit: Payer: Self-pay | Admitting: Internal Medicine

## 2021-03-31 DIAGNOSIS — N952 Postmenopausal atrophic vaginitis: Secondary | ICD-10-CM

## 2021-03-31 MED ORDER — ESTROGENS, CONJUGATED 0.625 MG/GM VA CREA: 1.0000 | TOPICAL_CREAM | Freq: Every day | VAGINAL | 1 refills | Status: DC

## 2021-03-31 NOTE — Progress Notes (Signed)
   Reason for call:   I received a call from [name] at 7 PM indicating vaginal itching.   Pertinent Data:   She has been having vaginal burning, itching, and an abnormal smell. There is a little bit of burning with urination but this is around the urethra not like a UTI. She has clear yellowish discharge  She has a history of atrophic vaginitis. She was recently seen in clinic on 4/22 and started on flagyl bid for 7 days for history of recurrent BV. She also has been treated multiple times with diflucan for presumed yeast infection, most recently 4/30 when she went to urgent care. Vaginal swab was done and was negative for infection.   She has been washing the area up to three times a day and wiping with vagisil wipes, which does not seem to be helping.    Assessment / Plan / Recommendations:   Advised she stop using vagisil wipes and excessive washing as this is likely exacerbating her symptoms from her atrophic vaginitis.   Doubt infection with recent negative vaginal swab.   Will start premarin cream and recommend moisturizing vaginal cream   Follow-up in clinic this week for physical exam to confirm no associated infection  As always, pt is advised that if symptoms worsen or new symptoms arise, they should go to an urgent care facility or to to ER for further evaluation.   Mary Gray A, DO   03/31/2021, 7:33 PM

## 2021-04-01 ENCOUNTER — Other Ambulatory Visit: Payer: Self-pay | Admitting: Internal Medicine

## 2021-04-01 DIAGNOSIS — I10 Essential (primary) hypertension: Secondary | ICD-10-CM

## 2021-04-04 ENCOUNTER — Other Ambulatory Visit: Payer: Self-pay | Admitting: Internal Medicine

## 2021-04-04 ENCOUNTER — Encounter: Payer: Self-pay | Admitting: Student in an Organized Health Care Education/Training Program

## 2021-04-04 ENCOUNTER — Other Ambulatory Visit: Payer: Self-pay | Admitting: Student in an Organized Health Care Education/Training Program

## 2021-04-04 ENCOUNTER — Telehealth: Payer: Self-pay | Admitting: Student in an Organized Health Care Education/Training Program

## 2021-04-04 ENCOUNTER — Other Ambulatory Visit: Payer: Self-pay | Admitting: Physical Medicine & Rehabilitation

## 2021-04-04 ENCOUNTER — Encounter: Payer: Medicare HMO | Admitting: Student in an Organized Health Care Education/Training Program

## 2021-04-04 ENCOUNTER — Encounter: Payer: Medicaid Other | Admitting: Internal Medicine

## 2021-04-04 DIAGNOSIS — E1165 Type 2 diabetes mellitus with hyperglycemia: Secondary | ICD-10-CM

## 2021-04-04 NOTE — Telephone Encounter (Signed)
Patient has called re; increased pain.  States she doesn't know what else to do.  She has appt on May 23 for procedure with Dr Cherylann Ratel.  I told her that at this point we do not have anything else to offer her until the procedure appt.  I did tell her we could see if there is an earlier appt for procedure and that I would send another message to Dr Cherylann Ratel if there is anything else he can do.  She is insistant that Dr Cherylann Ratel could do something prior to procedure.

## 2021-04-05 ENCOUNTER — Telehealth: Payer: Self-pay | Admitting: *Deleted

## 2021-04-05 ENCOUNTER — Telehealth: Payer: Self-pay | Admitting: Student in an Organized Health Care Education/Training Program

## 2021-04-05 NOTE — Telephone Encounter (Signed)
Attempted to reach patient to let her know that Dr Cherylann Ratel has said patient can come in for Toradol/Norflex injection.  Went to [facility] was full.

## 2021-04-06 NOTE — Telephone Encounter (Signed)
This was r/t getting a toradol/norflex injection.  Patient is aware of this option.

## 2021-04-08 ENCOUNTER — Encounter: Payer: Self-pay | Admitting: Internal Medicine

## 2021-04-08 ENCOUNTER — Ambulatory Visit (INDEPENDENT_AMBULATORY_CARE_PROVIDER_SITE_OTHER): Payer: Medicare HMO | Admitting: Internal Medicine

## 2021-04-08 ENCOUNTER — Other Ambulatory Visit (HOSPITAL_COMMUNITY)
Admission: RE | Admit: 2021-04-08 | Discharge: 2021-04-08 | Disposition: A | Discharge status: Home / Self Care | Payer: Medicare HMO | Source: Ambulatory Visit | Attending: Internal Medicine | Admitting: Internal Medicine

## 2021-04-08 ENCOUNTER — Other Ambulatory Visit: Payer: Self-pay

## 2021-04-08 ENCOUNTER — Telehealth: Payer: Self-pay | Admitting: *Deleted

## 2021-04-08 VITALS — BP 166/74 | HR 87 | Temp 97.6°F | Ht 61.0 in | Wt 180.0 lb

## 2021-04-08 DIAGNOSIS — G8929 Other chronic pain: Secondary | ICD-10-CM | POA: Diagnosis not present

## 2021-04-08 DIAGNOSIS — M545 Low back pain, unspecified: Secondary | ICD-10-CM | POA: Diagnosis not present

## 2021-04-08 DIAGNOSIS — Z1231 Encounter for screening mammogram for malignant neoplasm of breast: Secondary | ICD-10-CM | POA: Diagnosis not present

## 2021-04-08 DIAGNOSIS — N761 Subacute and chronic vaginitis: Secondary | ICD-10-CM | POA: Insufficient documentation

## 2021-04-08 DIAGNOSIS — Z113 Encounter for screening for infections with a predominantly sexual mode of transmission: Secondary | ICD-10-CM | POA: Insufficient documentation

## 2021-04-08 DIAGNOSIS — I10 Essential (primary) hypertension: Secondary | ICD-10-CM

## 2021-04-08 MED ORDER — KETOROLAC TROMETHAMINE 10 MG PO TABS: 10.0000 mg | ORAL_TABLET | Freq: Four times a day (QID) | ORAL | 0 refills | Status: DC | PRN

## 2021-04-08 MED ORDER — AMLODIPINE BESYLATE 5 MG PO TABS: 5.0000 mg | ORAL_TABLET | Freq: Every day | ORAL | 2 refills | Status: DC

## 2021-04-08 MED ORDER — KETOROLAC TROMETHAMINE 60 MG/2ML IM SOLN
60.0000 mg | Freq: Once | INTRAMUSCULAR | Status: AC
  Administered 2021-04-08: 60 mg via INTRAMUSCULAR

## 2021-04-08 NOTE — Progress Notes (Signed)
   CC: HTN, back pain, vaginal discharge and pain  HPI:  [name] is a 60 y.o. with a PMHx as listed below who presents to the clinic for HTN, back pain, vaginal discharge and pain.   Please see the Encounters tab for problem-based Assessment & Plan regarding status of patient's acute and chronic conditions.  Past Medical History:  Diagnosis Date  . Back pain   . Chronic interstitial cystitis    Followed by Dr. Marcine Matar  . COPD (chronic obstructive pulmonary disease) (HCC)   . DDD (degenerative disc disease), lumbar   . Degeneration of cervical intervertebral disc   . Depression   . Diabetes mellitus, type 2 (HCC) 2007  . Heme positive stool    EGD on 04/08/2010 by Dr. Ewing Schlein showed chronic gastritis and a few gastric polyps; pathology showed a fundic gland polyp.  Colonoscopy on 04/08/2010 showed small external and internal hemorrhoids, and a few benign-appearing diminutive polyps in the rectum, the distal sigmoid colon, and in the distal descending colon; pathology showed hyperplastic polyps.   . Hypertension   . Hypothyroidism   . Iron deficiency anemia   . Spinal stenosis    Review of Systems: Review of Systems  Constitutional: Negative for chills, fever, malaise/fatigue and weight loss.  Respiratory: Negative for cough and shortness of breath.   Cardiovascular: Positive for leg swelling. Negative for chest pain and palpitations.  Gastrointestinal: Negative for abdominal pain, diarrhea, nausea and vomiting.  Genitourinary: Positive for dysuria.       Vaginal discharge (yellow) with burning and itching  Musculoskeletal: Positive for back pain, falls and joint pain. Negative for neck pain.   Physical Exam:  Vitals:   04/08/21 1029 04/08/21 1033  BP: (!) 166/74 (!) 169/73  Pulse: 89 87  Temp: 97.6 F (36.4 C)   TempSrc: Oral   SpO2: 97%   Weight: 180 lb (81.6 kg)   Height: 5\' 1"  (1.549 m)    Physical Exam Vitals and nursing note reviewed.   Constitutional:      General: She is not in acute distress. HENT:     Head: Normocephalic and atraumatic.  Musculoskeletal:     Right lower leg: No edema.     Left lower leg: No edema.  Skin:    General: Skin is warm and dry.  Neurological:     Mental Status: She is alert and oriented to person, place, and time.  Psychiatric:        Mood and Affect: Mood normal.        Behavior: Behavior normal.        Thought Content: Thought content normal.        Judgment: Judgment normal.     Assessment & Plan:   See Encounters Tab for problem based charting.  Patient discussed with Dr. 

## 2021-04-08 NOTE — Assessment & Plan Note (Signed)
BP: (!) 166/74  Mary Gray states that she has been taking her enalapril without any difficulty.  She denies any chest pain or shortness of breath at this time.  She recently had a nurse visit about 2 weeks ago for her health insurance, which time her blood pressure was 120/78.  Assessment/plan: Blood pressure remains elevated above goal at this time.  I reviewed her previous blood pressure readings, and I suspect that the 120/78 reading from the RN visit is likely inaccurate.  Her blood pressure has been gradually trending upwards since at least April 2022 with previous intermittent highs between 150-160 in 2021.  It is likely her blood pressure is being affected by her pain level, however I do not suspect a 20-30 point rise due to just pain alone.  Ideally would have added on HCTZ, however patient has a history of recent hyponatremia of unknown etiology, but suspected to be SIADH.  Due to this, we will add on amlodipine.  - Continue enalapril 20 mg daily - Start amlodipine 5 mg daily - 4-week follow-up

## 2021-04-08 NOTE — Assessment & Plan Note (Addendum)
[name] states that she continues to have vaginal discharge that is yellow with burning and itching that has become quite bothersome to her.  She states that this been going on for weeks now.  She has not had any relief with the estrogen cream.  She declined a pelvic examination at this time due to back pain.  I counseled Mary Gray that a pelvic examination will be needed eventually to evaluate for vaginal atrophy and she expressed understanding. She is also worried about a prior bad experience where she had severe pain and bleeding after a speculum examination.   Assessment/plan: Difficult situation given that patient has severe back pain that restricts her from being able to lay flat for a pelvic exam. We will go ahead with repeat Aptima swab to assess if previous BV was adequately treated.  If negative, I explained that she will need a vaginal exam.  - Continue estrogen cream for now, refill available at pharmacy - Aptima swab pending  - Consider referral to OB/GYN   ADDENDUM:  No evidence of BV, candida or STI on swab. Discussed with Mary Gray that she will need a pelvic examination. She expressed understanding and will make an appointment when she is ready.

## 2021-04-08 NOTE — Telephone Encounter (Signed)
Lewis, Leah  Danille Oppedisano L, RN; Lewis, Leah; Stenson, Melissa; Samples, Beverly; New, Bradley; 1 other   received, thanks   

## 2021-04-08 NOTE — Assessment & Plan Note (Addendum)
Ms. Sawatzky continues to follow up with [facility], Dr. Cherylann Ratel.  She has an upcoming injection on Monday, but is having severe pain at this time.  She contacted the after-hours line on May 4, which time she was given a short course of Toradol.  It appears she contacted her pain physician Dr. Cherylann Ratel on the 16th who approved her for Toradol injection, but she was unable to obtain transportation to [place].  Patient notes she has had 5 falls recently due to severity of pain and associated leg weakness.   Assessment/plan: - Toradol 60 mg intramuscular injection provided today - Toradol 10 mg p.o. every 6 hours as needed to begin 24 hours after injection, #8 tablets -DME order for Rollator given patient is high fall risk

## 2021-04-08 NOTE — Telephone Encounter (Signed)
CM sent to Mary Gray at [facility] for rollator. F2F was today.

## 2021-04-08 NOTE — Patient Instructions (Addendum)
It was nice seeing you today! Thank you for choosing [facility] for your Primary Care.    Today we talked about:   1. Blood Pressure: I added a medication called Amlodipine. Please take 1 tablet daily. Continue to take Enalapril as well.   2. Back Pain: We have given you a Toradol injection in the clinic. I have also sent Toradol tablets to the pharmacy to last you till Monday. Start these tablets tomorrow (Saturday, May 21).  3. Vaginal Pain and discharge: We will recheck a swab today. For now, continue the Estrogen Cream. We will need to do a pelvic exam when your back is feeling better.    Follow up in 4 weeks to re-evaluate your blood pressure and vaginal pain.

## 2021-04-11 ENCOUNTER — Ambulatory Visit
Payer: Medicare HMO | Attending: Student in an Organized Health Care Education/Training Program | Admitting: Student in an Organized Health Care Education/Training Program

## 2021-04-11 LAB — CERVICOVAGINAL ANCILLARY ONLY
Bacterial Vaginitis (gardnerella): NEGATIVE
Candida Glabrata: NEGATIVE
Candida Vaginitis: NEGATIVE
Chlamydia: NEGATIVE
Comment: NEGATIVE
Comment: NEGATIVE
Comment: NEGATIVE
Comment: NEGATIVE
Comment: NEGATIVE
Comment: NORMAL
Neisseria Gonorrhea: NEGATIVE
Trichomonas: NEGATIVE

## 2021-04-11 NOTE — Progress Notes (Signed)
Internal Medicine Clinic Attending  Case discussed with Dr. Basaraba  At the time of the visit.  We reviewed the resident's history and exam and pertinent patient test results.  I agree with the assessment, diagnosis, and plan of care documented in the resident's note.  

## 2021-04-12 ENCOUNTER — Ambulatory Visit: Payer: Medicare HMO | Admitting: Behavioral Health

## 2021-04-12 ENCOUNTER — Other Ambulatory Visit: Payer: Self-pay

## 2021-04-12 ENCOUNTER — Telehealth: Payer: Self-pay | Admitting: Student in an Organized Health Care Education/Training Program

## 2021-04-12 ENCOUNTER — Telehealth: Payer: Self-pay

## 2021-04-12 ENCOUNTER — Other Ambulatory Visit: Payer: Self-pay | Admitting: Internal Medicine

## 2021-04-12 DIAGNOSIS — F419 Anxiety disorder, unspecified: Secondary | ICD-10-CM

## 2021-04-12 DIAGNOSIS — G8929 Other chronic pain: Secondary | ICD-10-CM

## 2021-04-12 MED ORDER — KETOROLAC TROMETHAMINE 10 MG PO TABS: 10.0000 mg | ORAL_TABLET | Freq: Two times a day (BID) | ORAL | 0 refills | Status: DC | PRN

## 2021-04-12 NOTE — BH Specialist Note (Signed)
Integrated Behavioral Health via Telemedicine Visit  04/12/2021 Mary Gray [id number]  Number of Integrated Behavioral Health visits: 6/6 Session Start time: 2:00pm  Session End time: 2:30pm Total time: 30  Referring Provider: Dr. Oswaldo Done, MD Patient/Family location: Pt in private @ home [facility] Provider location: [facility] Office All persons participating in visit: Pt & Clinician Types of Service: Individual psychotherapy  I connected with Mary Gray and/or Mary Gray's self via  Telephone or Video Enabled Telemedicine Application  (Video is Caregility application) and verified that I am speaking with the correct person using two identifiers. Discussed confidentiality: Yes   I discussed the limitations of telemedicine and the availability of in person appointments.  Discussed there is a possibility of technology failure and discussed alternative modes of communication if that failure occurs.  I discussed that engaging in this telemedicine visit, they consent to the provision of behavioral healthcare and the services will be billed under their insurance.  Patient and/or legal guardian expressed understanding and consented to Telemedicine visit: Yes   Presenting Concerns: Patient and/or family reports the following symptoms/concerns: elevated anxiety due to health status changes, exp of intense pain, & sleep onset difficulties. Duration of problem: months; Severity of problem: moderate  Patient and/or Family's Strengths/Protective Factors: Concrete supports in place (healthy food, safe environments, etc.) and Sense of purpose  Goals Addressed: Patient will: 1.  Reduce symptoms of: anxiety, depression, stress and pain  2.  Increase knowledge and/or ability of: coping skills, stress reduction and pain reduction techniques  3.  Demonstrate ability to: Increase healthy adjustment to current life circumstances  Progress towards Goals: Ongoing  Interventions: Interventions  utilized:  Solution-Focused Strategies, Supportive Counseling and Sleep Hygiene Standardized Assessments completed: screeners prn  Patient and/or Family Response: Pt receptive to call & requests 30 min check-in  Assessment: Patient currently experiencing elevated levels of anxiety due to inc'd pain that has been occurring for 2-3 mos. Pt has reduced ambulation due to the uncertainty of her legs & related weakness.  Pt needs an extended script for Toradol pills to assist her w/the pain since she missed her appt @ [place] for the Spinal Block injection due to a stomach bug.  Patient may benefit from use of music, meditation, & visualization technique (Favorite Place) to distract from the pain. Offered the ice cube technique when pain gets severe.  Plan: 1. Follow up with behavioral health clinician on : 2-3 wks on telehealth for 30 min 2. Behavioral recommendations: Journal btwn sessions, use other suggestions for pain control. 3. Referral(s): Integrated [employer] (In Clinic)  I discussed the assessment and treatment plan with the patient and/or parent/guardian. They were provided an opportunity to ask questions and all were answered. They agreed with the plan and demonstrated an understanding of the instructions.   They were advised to call back or seek an in-person evaluation if the symptoms worsen or if the condition fails to improve as anticipated.  Deneise Lever, LMFT

## 2021-04-12 NOTE — Telephone Encounter (Signed)
Patient cancelled her procedure appointment yesterday.  He has not prescribed medications for her at this point.  There will been medications ordered over the phone.

## 2021-04-12 NOTE — Telephone Encounter (Signed)
t is requesting a call back .Marland Kitchen

## 2021-04-12 NOTE — Telephone Encounter (Signed)
I have refilled a 5 day supply of Toradol twice daily. She should avoid prolonged use of this medicine as it can cause stomach ulcers or kidney injury if used for too long of a time.

## 2021-04-12 NOTE — Telephone Encounter (Signed)
Patient called back. States she was not able to go to pain doctor yesterday to receive injection as she had a "stomach virus." Earliest she can get back into pain clinic is 04/25/21. She is requesting refill on ketoralac to get her to that appt. [facility] uses [facility] on [address]

## 2021-04-12 NOTE — Telephone Encounter (Signed)
patient notified and is very Adult nurse.

## 2021-04-21 ENCOUNTER — Other Ambulatory Visit: Payer: Self-pay | Admitting: Student

## 2021-04-21 ENCOUNTER — Telehealth: Payer: Self-pay | Admitting: Internal Medicine

## 2021-04-21 ENCOUNTER — Telehealth: Payer: Self-pay | Admitting: Student in an Organized Health Care Education/Training Program

## 2021-04-21 ENCOUNTER — Other Ambulatory Visit: Payer: Self-pay

## 2021-04-21 ENCOUNTER — Encounter (HOSPITAL_COMMUNITY): Payer: Self-pay | Admitting: Emergency Medicine

## 2021-04-21 ENCOUNTER — Emergency Department (HOSPITAL_COMMUNITY)
Admission: EM | Admit: 2021-04-21 | Discharge: 2021-04-22 | Disposition: A | Discharge status: Home / Self Care | Payer: Medicare HMO | Attending: Emergency Medicine | Admitting: Emergency Medicine

## 2021-04-21 DIAGNOSIS — E119 Type 2 diabetes mellitus without complications: Secondary | ICD-10-CM | POA: Insufficient documentation

## 2021-04-21 DIAGNOSIS — F17210 Nicotine dependence, cigarettes, uncomplicated: Secondary | ICD-10-CM | POA: Diagnosis not present

## 2021-04-21 DIAGNOSIS — E039 Hypothyroidism, unspecified: Secondary | ICD-10-CM | POA: Diagnosis not present

## 2021-04-21 DIAGNOSIS — M5442 Lumbago with sciatica, left side: Secondary | ICD-10-CM | POA: Diagnosis not present

## 2021-04-21 DIAGNOSIS — J449 Chronic obstructive pulmonary disease, unspecified: Secondary | ICD-10-CM | POA: Insufficient documentation

## 2021-04-21 DIAGNOSIS — I10 Essential (primary) hypertension: Secondary | ICD-10-CM | POA: Diagnosis not present

## 2021-04-21 DIAGNOSIS — M5441 Lumbago with sciatica, right side: Secondary | ICD-10-CM | POA: Diagnosis not present

## 2021-04-21 DIAGNOSIS — Z7984 Long term (current) use of oral hypoglycemic drugs: Secondary | ICD-10-CM | POA: Insufficient documentation

## 2021-04-21 DIAGNOSIS — M545 Low back pain, unspecified: Secondary | ICD-10-CM | POA: Diagnosis present

## 2021-04-21 DIAGNOSIS — Z79899 Other long term (current) drug therapy: Secondary | ICD-10-CM | POA: Insufficient documentation

## 2021-04-21 LAB — URINALYSIS, ROUTINE W REFLEX MICROSCOPIC
Bilirubin Urine: NEGATIVE
Glucose, UA: NEGATIVE mg/dL
Hgb urine dipstick: NEGATIVE
Ketones, ur: NEGATIVE mg/dL
Nitrite: NEGATIVE
Protein, ur: NEGATIVE mg/dL
Specific Gravity, Urine: 1.01 (ref 1.005–1.030)
pH: 6 (ref 5.0–8.0)

## 2021-04-21 LAB — CBG MONITORING, ED: Glucose-Capillary: 149 mg/dL — ABNORMAL HIGH (ref 70–99)

## 2021-04-21 NOTE — Telephone Encounter (Signed)
  Reason for call:   [name] requested an after hours call. I placed an outgoing call to [name] at 6:56 PM regarding vaginal itching. The call was successful. We discussed She states the itching has become unbearable and she is desperate for relief. She has tried Vagisil, cold towels. . She is requesting medication be sent to the pharmacy that can provide relief.    Assessment/ Plan:   Vaginal itching is a chronic uncontrolled problem for [name]. I have recently discussed this with her at a clinic visit that without a pelvic examination, appropriate medical treatment cannot be provided. She has provided self-swabs that have been negative for Candida, BV, Trichomonas.   I recommended either an in-person appointment for a pelvic examination or referral to gynecology. She declined both stating transportation is difficult for her. We discussed that her insurance can provide transportation; [name] states she has not looked into that. I offered a referral to social work to help with transportation; [name] declined this referral.   As always, pt was advised that if symptoms worsen or new symptoms arise, they should go to an urgent care facility or to to ER for further evaluation.   Dr. Verdene Lennert Internal Medicine PGY-2  Pager: [phone] After 5pm on weekdays and 1pm on weekends: On Call pager [phone]  04/21/2021, 6:56 PM

## 2021-04-21 NOTE — ED Triage Notes (Signed)
Pt reports lower back pain that radiates down both legs with tingling sensation over the last few months that has gotten worse, pt is supposed to be going for back injections Monday. Denies any urinary sx. No recent falls. Hx of htn and diabetes.

## 2021-04-21 NOTE — Telephone Encounter (Signed)
Pt calling to report she is still having bad symptoms from her past UTI.  Patient states the following medication was called in on 03/31/2021   Premarin which was $494.00 and she could not afford it, instead she has been using over the counter medications and thinks she is now having another re occurrence of worse symptoms starting 2 days ago of Chills, burning, itching and is unable to rest at night time and wants to know what to do.  Please call the patient back.

## 2021-04-21 NOTE — Telephone Encounter (Signed)
I don't have anything to offer over the phone. We can offer he an appointment with [place], but this has been a refractory issue and I would recommend referral to gynecology.

## 2021-04-21 NOTE — ED Provider Notes (Signed)
Emergency Medicine Provider Triage Evaluation Note  Mary Gray , a 60 y.o. female  was evaluated in triage.  Pt complains of lower back pain that worsened today.  Also having dysuria and sharp shooting pain down her legs.  Scheduled for an injection.  Review of Systems  Positive: Back pain, dysuria Negative: Injuries or numbness  Physical Exam  BP (!) 100/56   Pulse 78   Temp 98.4 F (36.9 C) (Oral)   Resp 16   Ht 5\' 1"  (1.549 m)   Wt 81.6 kg   LMP 01/17/2009   SpO2 98%   BMI 34.01 kg/m  Gen:   Awake, no distress Resp:  Normal effort MSK:   Moves extremities without difficulty Other:  Moving lower extremities without difficulty  Medical Decision Making  Medically screening exam initiated at 9:04 PM.  Appropriate orders placed.  ALEXIUS ELLINGTON was informed that the remainder of the evaluation will be completed by another provider, this initial triage assessment does not replace that evaluation, and the importance of remaining in the ED until their evaluation is complete.  Will order urinalysis and CBG as she states that she has not eaten all day   [name], Dietrich Pates 04/21/21 2105    2106, MD 04/22/21 [phone]

## 2021-04-21 NOTE — Telephone Encounter (Signed)
RTC, Patient c/o vaginal itching.  [facility] was 04/08/21 w/ Dr. Huel Cote.  Patient states she cannot get her estrogen cream filled until next month and she has no estrogen cream to apply to area.  She has been using vagisil without relief.  She had swabs done at [facility], neg and declined a pelvic exam d/t back pain.  Dr. Huel Cote recommended she return for a pelvic.  Patient offered an appt for evaluation and pelvic exam, she declines, states she doesn't have a way up here. Will forward to PCP to advise. SChaplin, RN,BSN

## 2021-04-22 DIAGNOSIS — M5441 Lumbago with sciatica, right side: Secondary | ICD-10-CM | POA: Diagnosis not present

## 2021-04-22 MED ORDER — KETOROLAC TROMETHAMINE 60 MG/2ML IM SOLN
30.0000 mg | Freq: Once | INTRAMUSCULAR | Status: AC
  Administered 2021-04-22: 30 mg via INTRAMUSCULAR
  Filled 2021-04-22: qty 2

## 2021-04-22 MED ORDER — PREDNISONE 20 MG PO TABS
60.0000 mg | ORAL_TABLET | Freq: Once | ORAL | Status: AC
  Administered 2021-04-22: 60 mg via ORAL
  Filled 2021-04-22: qty 3

## 2021-04-22 MED ORDER — FLUCONAZOLE 200 MG PO TABS
200.0000 mg | ORAL_TABLET | Freq: Once | ORAL | Status: AC
  Administered 2021-04-22: 200 mg via ORAL
  Filled 2021-04-22: qty 1

## 2021-04-22 MED ORDER — HYDROMORPHONE HCL 1 MG/ML IJ SOLN
1.0000 mg | Freq: Once | INTRAMUSCULAR | Status: AC
  Administered 2021-04-22: 1 mg via INTRAMUSCULAR
  Filled 2021-04-22: qty 1

## 2021-04-22 NOTE — Telephone Encounter (Signed)
TC to patient , VM obtained and mailbox is full.  RN unable to leave message. SChaplin, RN,BSN

## 2021-04-22 NOTE — ED Provider Notes (Signed)
[facility]   CSN: [id number] Arrival date & time: 04/21/21  2045     History Chief Complaint  Patient presents with  . Back Pain    Mary Gray is a 60 y.o. female.   Back Pain Location:  Generalized Quality:  Aching Pain severity:  Mild Onset quality:  Gradual Duration:  12 months Timing:  Constant Progression:  Worsening Chronicity:  Chronic Context: not emotional stress   Relieved by:  None tried Worsened by:  Nothing Ineffective treatments:  None tried Associated symptoms: leg pain   Associated symptoms: no abdominal pain, no fever, no pelvic pain, no perianal numbness, no tingling, no weakness and no weight loss   Risk factors: obesity   Risk factors: no hx of cancer        Past Medical History:  Diagnosis Date  . Back pain   . Chronic interstitial cystitis    Followed by Dr. Marcine MatarStephen Dahlstedt  . COPD (chronic obstructive pulmonary disease) (HCC)   . DDD (degenerative disc disease), lumbar   . Degeneration of cervical intervertebral disc   . Depression   . Diabetes mellitus, type 2 (HCC) 2007  . Heme positive stool    EGD on 04/08/2010 by Dr. Ewing SchleinMagod showed chronic gastritis and a few gastric polyps; pathology showed a fundic gland polyp.  Colonoscopy on 04/08/2010 showed small external and internal hemorrhoids, and a few benign-appearing diminutive polyps in the rectum, the distal sigmoid colon, and in the distal descending colon; pathology showed hyperplastic polyps.   . Hypertension   . Hypothyroidism   . Iron deficiency anemia   . Spinal stenosis     Patient Active Problem List   Diagnosis Date Noted  . Lumbar spondylosis 03/22/2021  . Chronic radicular lumbar pain 03/22/2021  . Bilateral primary osteoarthritis of knee 03/22/2021  . Primary osteoarthritis of left knee 03/22/2021  . Chronic pain syndrome 03/22/2021  . Tobacco use disorder 03/22/2021  . Hyponatremia 03/04/2021  . Chronic  bilateral low back pain without sciatica 12/13/2020  . Sleep disturbance 12/13/2020  . Vaginitis 10/27/2020  . Right ear pain 10/13/2020  . Nausea 02/10/2020  . B12 deficiency 11/25/2018  . Urinary, incontinence, stress female 09/09/2018  . Primary osteoarthritis of right knee 05/01/2018  . Chronic venous insufficiency 02/26/2017  . Candidal skin infection 03/03/2014  . Preventative health care 05/14/2013  . AVN (avascular necrosis of bone) (HCC) 03/07/2013  . Spinal stenosis, lumbar region, with neurogenic claudication 03/01/2011  . Hypothyroidism 07/06/2006  . Diabetes mellitus type 2, uncontrolled (HCC) 07/06/2006  . Hyperlipemia 07/06/2006  . Generalized anxiety disorder 07/06/2006  . Essential hypertension 07/06/2006  . GERD 07/06/2006  . Lumbar degenerative disc disease 07/06/2006    Past Surgical History:  Procedure Laterality Date  . CARPAL TUNNEL RELEASE  05/08/2000   By Dr. Katy Fitchobert V. Sypher, Montez HagemanJr.     OB History   No obstetric history on file.     Family History  Problem Relation Age of Onset  . Heart attack Father 662  . Diabetes Mother   . Hypertension Mother   . Diabetes Sister   . Breast cancer Neg Hx   . Colon cancer Neg Hx     Social History   Tobacco Use  . Smoking status: Current Every Day Smoker    Packs/day: 1.00    Years: 15.00    Pack years: 15.00    Types: Cigarettes    Last attempt to quit: 01/18/1996  Years since quitting: 25.2  . Smokeless tobacco: Never Used  . Tobacco comment: 5-6 per day  Vaping Use  . Vaping Use: Never used  Substance Use Topics  . Alcohol use: No  . Drug use: No    Home Medications Prior to Admission medications   Medication Sig Start Date End Date Taking? Authorizing Provider  Accu-Chek Softclix Lancets lancets Check blood sugar 1 time per day 07/12/20   Tyson Alias, MD  amitriptyline (ELAVIL) 10 MG tablet TAKE 1 TABLET BY MOUTH EVERYDAY AT BEDTIME 03/25/21   Marcello Fennel, MD  amLODipine  (NORVASC) 5 MG tablet Take 1 tablet (5 mg total) by mouth daily. 04/08/21 07/07/21  Verdene Lennert, MD  citalopram (CELEXA) 10 MG tablet TAKE 1 TABLET BY MOUTH EVERY DAY 02/21/21   Tyson Alias, MD  conjugated estrogens (PREMARIN) vaginal cream Place 1 Applicatorful vaginally daily. 03/31/21   Seawell, Jaimie A, DO  [facility] VITAMIN B12 1000 MCG tablet TAKE 1 TABLET BY MOUTH EVERY DAY 01/12/21   Tyson Alias, MD  diclofenac Sodium (VOLTAREN) 1 % GEL Apply 4 g topically 4 (four) times daily. 07/29/20   [provider]  enalapril (VASOTEC) 20 MG tablet TAKE 1 TABLET BY MOUTH EVERY DAY 04/04/21   Tyson Alias, MD  esomeprazole (NEXIUM) 40 MG capsule TAKE 1 CAPSULE BY MOUTH EVERY DAY 02/25/21   Tyson Alias, MD  gabapentin (NEURONTIN) 300 MG capsule TAKE 1 CAPSULE BY MOUTH THREE TIMES A DAY 04/05/21   Marcello Fennel, MD  glucose blood (ACCU-CHEK GUIDE) test strip Check blood sugar1 time per day 07/12/20   Tyson Alias, MD  hydrOXYzine (ATARAX/VISTARIL) 25 MG tablet Take 1 tablet (25 mg total) by mouth 2 (two) times daily as needed for anxiety. for anxiety 03/08/21   Tyson Alias, MD  ketorolac (TORADOL) 10 MG tablet Take 1 tablet (10 mg total) by mouth 2 (two) times daily as needed. 04/12/21   Tyson Alias, MD  levothyroxine (SYNTHROID) 88 MCG tablet TAKE 1 TABLET BY MOUTH EVERY DAY BEFORE BREAKFAST 02/09/21   Tyson Alias, MD  lidocaine (LIDODERM) 5 % PLACE 3 PATCHES ONTO THE SKIN EVERY 12 HOURS. REMOVE AND DISCARD WITHIN 12 HRS OR AS DIRECTED BY MD 03/24/21   Tyson Alias, MD  LORazepam (ATIVAN) 0.5 MG tablet Take one tablet 30 minutes prior to MRI. Can take additional tablet at appointment if needed. Patient not taking: Reported on 03/22/2021 02/03/21   Tyson Alias, MD  metFORMIN (GLUCOPHAGE-XR) 500 MG 24 hr tablet TAKE 2 TABLETS BY MOUTH TWICE A DAY 02/07/21   Tyson Alias, MD  methocarbamol (ROBAXIN) 500  MG tablet TAKE 1 TABLET BY MOUTH TWICE A DAY AS NEEDED 02/14/21   [name], MD  multivitamin-iron-minerals-folic acid (CENTRUM) chewable tablet Chew 1 tablet by mouth daily. 07/12/20   Tyson Alias, MD  neomycin-polymyxin-hydrocortisone (CORTISPORIN) 3.5-10000-1 OTIC suspension Place 4 drops into both ears 3 (three) times daily. Patient not taking: Reported on 03/22/2021 10/15/20   Eustace Moore, MD  nystatin (NYSTATIN) powder Apply 1 application topically 3 (three) times daily. 03/10/21   [name], MD  ropinirole (REQUIP) 5 MG tablet TAKE 1 TABLET BY MOUTH EVERYDAY AT BEDTIME 03/04/21   Stover, Titorya, DPM  tiZANidine (ZANAFLEX) 4 MG tablet TAKE 1 TABLET (4 MG TOTAL) BY MOUTH EVERY 8 (EIGHT) HOURS AS NEEDED FOR MUSCLE SPASMS 03/02/21   Marcello Fennel, MD  traMADol (ULTRAM) 50 MG tablet  TAKE 1 TABLET BY MOUTH EVERY DAY 04/05/21   Tyson Alias, MD  pravastatin (PRAVACHOL) 10 MG tablet TAKE 1 TABLET BY MOUTH EVERY DAY IN THE EVENING 01/16/19 10/15/20  Tyson Alias, MD    Allergies    Codeine sulfate, Meperidine and related, Minocycline hcl, Penicillins, Propoxyphene n-acetaminophen, and Tetracycline hcl  Review of Systems   Review of Systems  Constitutional: Negative for fever and weight loss.  Gastrointestinal: Negative for abdominal pain.  Genitourinary: Negative for pelvic pain.  Musculoskeletal: Positive for back pain.  Neurological: Negative for tingling and weakness.  All other systems reviewed and are negative.   Physical Exam Updated Vital Signs BP (!) 145/65 (BP Location: Left Arm)   Pulse 80   Temp 98.3 F (36.8 C) (Oral)   Resp 20   Ht 5\' 1"  (1.549 m)   Wt 81.6 kg   LMP 01/17/2009   SpO2 98%   BMI 34.01 kg/m   Physical Exam Vitals and nursing note reviewed.  Constitutional:      Appearance: She is well-developed.  HENT:     Head: Normocephalic and atraumatic.     Nose: No congestion or rhinorrhea.     Mouth/Throat:      Mouth: Mucous membranes are moist.     Pharynx: Oropharynx is clear.  Eyes:     Pupils: Pupils are equal, round, and reactive to light.  Cardiovascular:     Rate and Rhythm: Normal rate and regular rhythm.  Pulmonary:     Effort: No respiratory distress.     Breath sounds: No stridor.  Abdominal:     General: There is no distension.  Musculoskeletal:        General: No swelling or tenderness. Normal range of motion.     Cervical back: Normal range of motion.  Skin:    General: Skin is warm and dry.  Neurological:     General: No focal deficit present.     Mental Status: She is alert.     ED Results / Procedures / Treatments   Labs (all labs ordered are listed, but only abnormal results are displayed) Labs Reviewed  URINALYSIS, ROUTINE W REFLEX MICROSCOPIC - Abnormal; Notable for the following components:      Result Value   Leukocytes,Ua SMALL (*)    Bacteria, UA RARE (*)    All other components within normal limits  CBG MONITORING, ED - Abnormal; Notable for the following components:   Glucose-Capillary 149 (*)    All other components within normal limits    EKG None  Radiology No results found.  Procedures Procedures   Medications Ordered in ED Medications  fluconazole (DIFLUCAN) tablet 200 mg (200 mg Oral Given 04/22/21 0248)  HYDROmorphone (DILAUDID) injection 1 mg (1 mg Intramuscular Given 04/22/21 0216)  ketorolac (TORADOL) injection 30 mg (30 mg Intramuscular Given 04/22/21 0214)  predniSONE (DELTASONE) tablet 60 mg (60 mg Oral Given 04/22/21 0212)    ED Course  I have reviewed the triage vital signs and the nursing notes.  Pertinent labs & imaging results that were available during my care of the patient were reviewed by me and considered in my medical decision making (see chart for details).    MDM Rules/Calculators/A&P                         Acute worsening of chronic back pain. Will treat symptomatically here. Some sciatic symptoms so will treat with  steroids. No red flags for  repeat imaging.  Not sure the cause of her acute worsening back pain but no red flags to require further imaging or work-up.  She did have 1 soft pressure when she first got here but all of the pressures since then have been within normal limits.  She has appointments next week to follow-up for the back pain and I feel she is safe to do so.  Final Clinical Impression(s) / ED Diagnoses Final diagnoses:  Acute bilateral low back pain with bilateral sciatica    Rx / DC Orders ED Discharge Orders    None       Clarrisa Kaylor, Barbara Cower, MD 04/23/21 [phone]

## 2021-04-25 ENCOUNTER — Ambulatory Visit: Payer: Medicare HMO | Admitting: Student in an Organized Health Care Education/Training Program

## 2021-04-27 ENCOUNTER — Ambulatory Visit: Payer: Medicare HMO | Admitting: Student in an Organized Health Care Education/Training Program

## 2021-04-28 ENCOUNTER — Other Ambulatory Visit: Payer: Self-pay | Admitting: Student in an Organized Health Care Education/Training Program

## 2021-04-28 DIAGNOSIS — F411 Generalized anxiety disorder: Secondary | ICD-10-CM

## 2021-04-30 ENCOUNTER — Other Ambulatory Visit: Payer: Self-pay | Admitting: Student in an Organized Health Care Education/Training Program

## 2021-05-03 ENCOUNTER — Telehealth: Payer: Self-pay | Admitting: Behavioral Health

## 2021-05-03 ENCOUNTER — Ambulatory Visit: Payer: Medicare HMO | Admitting: Behavioral Health

## 2021-05-03 DIAGNOSIS — F419 Anxiety disorder, unspecified: Secondary | ICD-10-CM

## 2021-05-03 DIAGNOSIS — F331 Major depressive disorder, recurrent, moderate: Secondary | ICD-10-CM

## 2021-05-03 NOTE — BH Specialist Note (Signed)
Integrated Behavioral Health via Telemedicine Visit  05/03/2021 Mary Gray [id number]  Number of Integrated Behavioral Health visits: 7 Session Start time: 1:00pm  Session End time: 1:30pm Total time: 30  Referring Provider: Dr. Oswaldo Done, MD Patient/Family location: Pt @ home in private [facility] Provider location: [facility] Office All persons participating in visit: Pt & Clinician Types of Service: Health Promotion  I connected with Mary Gray and/or Mary Gray's  self  via  Telephone or Video Enabled Telemedicine Application  (Video is Caregility application) and verified that I am speaking with the correct person using two identifiers. Discussed confidentiality: Yes   I discussed the limitations of telemedicine and the availability of in person appointments.  Discussed there is a possibility of technology failure and discussed alternative modes of communication if that failure occurs.  I discussed that engaging in this telemedicine visit, they consent to the provision of behavioral healthcare and the services will be billed under their insurance.  Patient and/or legal guardian expressed understanding and consented to Telemedicine visit: Yes   Presenting Concerns: Patient and/or family reports the following symptoms/concerns: elevated anxiety due to pain from musc spasms in her leg. Pt explained how she missed her Referral Appt to [facility] as scheduled due to her report of COVID19 Sx to the Practice. The appt was r/s'd to the next Wed & she missed it bc of lack of transportation. Pt realizes these events were due to her health issues. As a result, Pt made ED visit on 04/21/21. Pt was treated & released. Duration of problem: months; Severity of problem: moderate  Patient and/or Family's Strengths/Protective Factors: Social connections and [place]crete supports in place (healthy food, safe environments, etc.)  Goals Addressed: Patient will:  Reduce symptoms of:  anxiety, depression, and stress & pain  Increase knowledge and/or ability of: coping skills, healthy habits, and stress reduction   Demonstrate ability to: Increase healthy adjustment to current life circumstances  Progress towards Goals: Ongoing  Interventions: Interventions utilized:   Assisted Pt to trouble-shoot her need for Appt to [facility] Clinic @ [facility] & then suggested the resources avail for transport that is reliable. Pt will call the [facility] Mgmt Clinic @ [facility] to secure a r/s appt & communicate this to [facility] for assistance w/transport as avail Standardized Assessments completed:  screeners prn  Patient and/or Family Response: Pt receptive to call & unable to determine how to proceed w/missed Pain Mgmt Clinic appts from last week. Pt c/o cont'd back & leg pain.  Assessment: Patient currently experiencing back & leg pain that cont's throughout the night. Pt trying to determine what to do today. Gave her instructions narrated above.  Patient may benefit from appt being r/s'd @ [facility].  Plan: Follow up with behavioral health clinician on : one month for 30 min ck-in on telehealth Behavioral recommendations: Do as instructed & make the call for appt Referral(s): [facility] [employer] (In Clinic)  I discussed the assessment and treatment plan with the patient and/or parent/guardian. They were provided an opportunity to ask questions and all were answered. They agreed with the plan and demonstrated an understanding of the instructions.   They were advised to call back or seek an in-person evaluation if the symptoms worsen or if the condition fails to improve as anticipated.  Deneise Lever, LMFT

## 2021-05-03 NOTE — Telephone Encounter (Signed)
Pt called w/information re: [facility] appt. RTC to Pt @ 2:45pm. Pt related she has been released from the Practice due to 2 sequential missed appt in June. Pt had COVID-19 Sx during first visit & was asked to r/s. Appt was r/s'd to 2 days later. Pt missed this appt due to her Niece's inability to drive her.  Troubleshot how Pt can call & request another appt. Pt did so, but was told she could not r/s with Provider per protocol of Practice. Pt requesting a second Referral to another Pain Mgmt Clinic.  Dr. Monna Fam

## 2021-05-10 ENCOUNTER — Telehealth: Payer: Self-pay | Admitting: Student

## 2021-05-10 NOTE — Telephone Encounter (Signed)
1:38 AM received page for call back.  Spoke with patient on the phone.  She endorses worsening lower back pain that radiates to her bilateral lower extremities.  Reports numbness and tingling sensation of bilateral lower extremity, left worse than right.  Also reports falling due to the pain.  Patient denies fever or chills.  Denies loss control of bowel movement or urination.  Patient currently taking tramadol, which she states that only provide minimal relief.  She also taking Goody powder.  Patient states that she was given Toradol at last office visit which helped.  She also went to the ED on 6/2 for lower back pain and was given IV Dilaudid, IV Toradol and prednisone.   This is a chronic problem.  Recent MRI showed moderate to severe spinal canal foraminal narrowing at L2-3, L3-4 and moderate L4-5.  Given no red flag symptoms, advised patient to call the clinic in the morning for an appointment.  I explained to patient that it is unsafe to prescribe strong pain medication through the phone.  Also advised patient to go to the ED if pain is unbearable.  Patient verbalized understanding.

## 2021-05-11 ENCOUNTER — Other Ambulatory Visit: Payer: Self-pay | Admitting: Internal Medicine

## 2021-05-11 ENCOUNTER — Other Ambulatory Visit: Payer: Self-pay | Admitting: Student in an Organized Health Care Education/Training Program

## 2021-05-11 DIAGNOSIS — N952 Postmenopausal atrophic vaginitis: Secondary | ICD-10-CM

## 2021-05-11 DIAGNOSIS — E1165 Type 2 diabetes mellitus with hyperglycemia: Secondary | ICD-10-CM

## 2021-05-11 DIAGNOSIS — M48061 Spinal stenosis, lumbar region without neurogenic claudication: Secondary | ICD-10-CM

## 2021-05-11 DIAGNOSIS — M62830 Muscle spasm of back: Secondary | ICD-10-CM

## 2021-05-12 ENCOUNTER — Telehealth: Payer: Self-pay | Admitting: Internal Medicine

## 2021-05-12 NOTE — Telephone Encounter (Signed)
4:48 PM received page to call patient. Patient continues to have low back pain that radiates to her bilateral lower extremities.  On review of chart patient has chronic low back pain that is multifactorial, she has lumbar spinal stenosis with neurogenic claudication, advanced lumbar facet arthropathy with neuroforaminal stenosis. Reviewed notes and Dr. Cyndie Chime spoke to patient sick/21/22 for this pain.   Recently discharged from pain management clinic and reports [facility] is helping her reestablish with a new pain management doctor. However she continues to be in pain. She has Toradol for pain. Recommended if she is having intractable pain to seek emergent or urgent care. Plan for pain management already in place. Will ask for patient to be seen in clinic as soon as possible.

## 2021-05-16 ENCOUNTER — Ambulatory Visit: Payer: Medicare HMO | Admitting: Internal Medicine

## 2021-05-17 ENCOUNTER — Encounter: Payer: Medicare HMO | Admitting: Internal Medicine

## 2021-05-20 ENCOUNTER — Other Ambulatory Visit: Payer: Self-pay | Admitting: Student in an Organized Health Care Education/Training Program

## 2021-05-20 DIAGNOSIS — I10 Essential (primary) hypertension: Secondary | ICD-10-CM

## 2021-06-01 ENCOUNTER — Telehealth: Payer: Self-pay

## 2021-06-01 NOTE — Telephone Encounter (Signed)
lidocaine (LIDODERM) 5, REQUESTING PA.

## 2021-06-02 ENCOUNTER — Telehealth: Payer: Self-pay | Admitting: *Deleted

## 2021-06-02 NOTE — Telephone Encounter (Signed)
PA follow for Lidocaine 5% Patches.  Have been approved since 11/20/2020 thru 11/19/2021. Call to [facility] unable to run approval..  Call to [facility] to check on issue.  Spoke to representative who stated per day.  Patient is to get 3 per day.  Quantity was being run as 50 per 7 days.[facility] changed to 50 per 30 days. Run and was approved.  Call to patient message was left to call [facility] concerning prescription she had called about  or the Clinics and to ask for [facility].  Angelina Ok, RN 06/02/2021 9:39 AM.

## 2021-06-06 ENCOUNTER — Encounter: Payer: Medicare HMO | Admitting: Internal Medicine

## 2021-06-17 ENCOUNTER — Other Ambulatory Visit: Payer: Self-pay | Admitting: Student in an Organized Health Care Education/Training Program

## 2021-06-17 DIAGNOSIS — I10 Essential (primary) hypertension: Secondary | ICD-10-CM

## 2021-06-22 ENCOUNTER — Encounter: Payer: Self-pay | Admitting: Internal Medicine

## 2021-06-22 ENCOUNTER — Other Ambulatory Visit: Payer: Self-pay

## 2021-06-22 ENCOUNTER — Ambulatory Visit (INDEPENDENT_AMBULATORY_CARE_PROVIDER_SITE_OTHER): Payer: Medicare HMO | Admitting: Internal Medicine

## 2021-06-22 VITALS — BP 130/76 | HR 92 | Temp 98.7°F | Ht 61.0 in | Wt 195.0 lb

## 2021-06-22 DIAGNOSIS — G8929 Other chronic pain: Secondary | ICD-10-CM

## 2021-06-22 DIAGNOSIS — M51369 Other intervertebral disc degeneration, lumbar region without mention of lumbar back pain or lower extremity pain: Secondary | ICD-10-CM

## 2021-06-22 DIAGNOSIS — M545 Low back pain, unspecified: Secondary | ICD-10-CM | POA: Diagnosis not present

## 2021-06-22 DIAGNOSIS — M5136 Other intervertebral disc degeneration, lumbar region: Secondary | ICD-10-CM

## 2021-06-22 MED ORDER — KETOROLAC TROMETHAMINE 10 MG PO TABS: 10.0000 mg | ORAL_TABLET | Freq: Two times a day (BID) | ORAL | 0 refills | Status: DC | PRN

## 2021-06-22 NOTE — Patient Instructions (Signed)
Thank you, Ms.[name] for allowing [place] to provide your care today. Today we discussed back pain.    I have ordered the following labs for you:  Lab Orders  No laboratory test(s) ordered today     Tests ordered today:  none  Referrals ordered today:   Referral Orders  No referral(s) requested today     Medication Changes:   Medications Discontinued During This Encounter  Medication Reason   ketorolac (TORADOL) 10 MG tablet Reorder     Meds ordered this encounter  Medications   ketorolac (TORADOL) 10 MG tablet    Sig: Take 1 tablet (10 mg total) by mouth 2 (two) times daily as needed.    Dispense:  10 tablet    Refill:  0     Instructions: Please make a follow up appointment with Dr. Cherylann Ratel for further evaluation and management of your back pain.  Follow up:  with your PCP  in 1 month.    Remember:  Should you have any questions or concerns please call the internal medicine clinic at [phone].     Dellia Cloud, D.O. [facility]

## 2021-06-22 NOTE — Assessment & Plan Note (Addendum)
Assessment: Patient presents for reevaluation of her lower back pain.  Patient was recently set up with Mary Gray with orthopedics for pain management and further evaluation and management of her spinal stenosis.  Mary Gray recommended minimally invasive lumbar decompression for her spinal stenosis.  There are multiple notes in the chart stating that patient called back insisting that she needed medication management for her pain.  She was offered in office Toradol injections.  Patient ultimately canceled her procedural appointment and has not followed up since.   Patient states that she continues to have low back pain that radiates down her legs with neurogenic claudication.  She states that her symptoms significantly impacts her daily life and have led to increased falls over the last year.  She denies any red flag signs including bowel or bladder incontinence or saddle anesthesia. Patient has good lower extremity strength but range of motion limited due to pain.   I counseled her regarding the importance of following up with [facility] for procedural management of her low back pain as this could likely give her symptomatic relief.  I offered her a short course of NSAIDs for her pain but stated that I would not be able to give additional pain medicine. I encouraged her to follow up with Mary Gray   Plan: -10-day course of ketorolac -Recommended following up with her orthopedist/pain management doctor,Mary Gray

## 2021-06-22 NOTE — Progress Notes (Signed)
CC: Back pain  HPI:  [name] is a 60 y.o. female with a past medical history stated below and presents today for back pain. Please see problem based assessment and plan for additional details.  Past Medical History:  Diagnosis Date   Back pain    Chronic interstitial cystitis    Followed by Dr. Marcine Gray   COPD (chronic obstructive pulmonary disease) (HCC)    DDD (degenerative disc disease), lumbar    Degeneration of cervical intervertebral disc    Depression    Diabetes mellitus, type 2 (HCC) 2007   Heme positive stool    EGD on 04/08/2010 by Dr. Ewing Gray showed chronic gastritis and a few gastric polyps; pathology showed a fundic gland polyp.  Colonoscopy on 04/08/2010 showed small external and internal hemorrhoids, and a few benign-appearing diminutive polyps in the rectum, the distal sigmoid colon, and in the distal descending colon; pathology showed hyperplastic polyps.    Hypertension    Hypothyroidism    Iron deficiency anemia    Spinal stenosis     Current Outpatient Medications on File Prior to Visit  Medication Sig Dispense Refill   traMADol (ULTRAM) 50 MG tablet TAKE 1 TABLET BY MOUTH EVERY DAY 30 tablet 1   Accu-Chek Softclix Lancets lancets Check blood sugar 1 time per day 100 each 3   amitriptyline (ELAVIL) 10 MG tablet TAKE 1 TABLET BY MOUTH EVERYDAY AT BEDTIME 90 tablet 1   amLODipine (NORVASC) 5 MG tablet TAKE 1 TABLET (5 MG TOTAL) BY MOUTH DAILY. 90 tablet 3   citalopram (CELEXA) 10 MG tablet TAKE 1 TABLET BY MOUTH EVERY DAY 90 tablet 1   [facility] VITAMIN B12 1000 MCG tablet TAKE 1 TABLET BY MOUTH EVERY DAY 90 tablet 3   diclofenac Sodium (VOLTAREN) 1 % GEL Apply 4 g topically 4 (four) times daily.     enalapril (VASOTEC) 20 MG tablet TAKE 1 TABLET BY MOUTH EVERY DAY 30 tablet 1   esomeprazole (NEXIUM) 40 MG capsule TAKE 1 CAPSULE BY MOUTH EVERY DAY 90 capsule 1   gabapentin (NEURONTIN) 300 MG capsule TAKE 1 CAPSULE BY MOUTH THREE TIMES A DAY 90  capsule 1   glucose blood (ACCU-CHEK GUIDE) test strip CHECK BLOOD SUGAR 1 TIME PER DAY 100 strip 3   hydrOXYzine (ATARAX/VISTARIL) 25 MG tablet TAKE 1 TABLET (25 MG TOTAL) BY MOUTH 2 (TWO) TIMES DAILY AS NEEDED FOR ANXIETY. FOR ANXIETY 180 tablet 1   levothyroxine (SYNTHROID) 88 MCG tablet TAKE 1 TABLET BY MOUTH EVERY DAY BEFORE BREAKFAST 30 tablet 11   lidocaine (LIDODERM) 5 % PLACE 3 PATCHES ONTO THE SKIN EVERY 12 HOURS. REMOVE AND DISCARD WITHIN 12 HRS OR AS DIRECTED BY MD 50 patch 1   LORazepam (ATIVAN) 0.5 MG tablet Take one tablet 30 minutes prior to MRI. Can take additional tablet at appointment if needed. (Patient not taking: Reported on 03/22/2021) 2 tablet 0   metFORMIN (GLUCOPHAGE-XR) 500 MG 24 hr tablet TAKE 2 TABLETS BY MOUTH TWICE A DAY 360 tablet 1   methocarbamol (ROBAXIN) 500 MG tablet TAKE 1 TABLET BY MOUTH TWICE A DAY AS NEEDED 30 tablet 0   multivitamin-iron-minerals-folic acid (CENTRUM) chewable tablet Chew 1 tablet by mouth daily. 90 tablet 3   neomycin-polymyxin-hydrocortisone (CORTISPORIN) 3.5-10000-1 OTIC suspension Place 4 drops into both ears 3 (three) times daily. (Patient not taking: Reported on 03/22/2021) 10 mL 0   nystatin (NYSTATIN) powder Apply 1 application topically 3 (three) times daily. 15 g 0  PREMARIN vaginal cream PLACE 1 APPLICATORFUL VAGINALLY DAILY. 30 g 1   ropinirole (REQUIP) 5 MG tablet TAKE 1 TABLET BY MOUTH EVERYDAY AT BEDTIME 90 tablet 1   tiZANidine (ZANAFLEX) 4 MG tablet TAKE 1 TABLET (4 MG TOTAL) BY MOUTH EVERY 8 (EIGHT) HOURS AS NEEDED FOR MUSCLE SPASMS 270 tablet 0   [DISCONTINUED] pravastatin (PRAVACHOL) 10 MG tablet TAKE 1 TABLET BY MOUTH EVERY DAY IN THE EVENING 90 tablet 3   No current facility-administered medications on file prior to visit.    Family History  Problem Relation Age of Onset   Heart attack Father 78   Diabetes Mother    Hypertension Mother    Diabetes Sister    Breast cancer Neg Hx    Colon cancer Neg Hx     Social  History   Socioeconomic History   Marital status: Single    Spouse name: Not on file   Number of children: Not on file   Years of education: Not on file   Highest education level: Not on file  Occupational History   Not on file  Tobacco Use   Smoking status: Every Day    Packs/day: 0.50    Years: 15.00    Pack years: 7.50    Types: Cigarettes    Last attempt to quit: 01/18/1996    Years since quitting: 25.4   Smokeless tobacco: Never   Tobacco comments:    1/2 pack per day  Vaping Use   Vaping Use: Never used  Substance and Sexual Activity   Alcohol use: No   Drug use: No   Sexual activity: Not Currently  Other Topics Concern   Not on file  Social History Narrative   Not on file   Social Determinants of Health   Financial Resource Strain: Not on file  Food Insecurity: Not on file  Transportation Needs: Not on file  Physical Activity: Not on file  Stress: Not on file  Social Connections: Not on file  Intimate Partner Violence: Not on file    Review of Systems: ROS negative except for what is noted on the assessment and plan.  Vitals:   06/22/21 0925  BP: 130/76  Pulse: 92  Temp: 98.7 F (37.1 C)  TempSrc: Oral  SpO2: 100%  Weight: 195 lb (88.5 kg)  Height: 5\' 1"  (1.549 m)    Physical Exam: Gen: A&O x3 and in no apparent distress, well appearing and nourished. Abd: BS (+) x4, soft, non-tender, without obvious hepatosplenomegaly or masses MSK: Grossly normal strength, decreased active/passive range of motion due to pain, normal tone. TTP of her lumbar back bilaterally. Skin: good skin turgor, no rashes, unusual bruising, or prominent lesions.  Neuro: No focal deficits, grossly normal sensation and coordination.   Assessment & Plan:   See Encounters Tab for problem based charting.  Patient discussed with Dr. , D.O. [facility] Medicine, PGY-3 Pager: [phone], Phone: [phone] Date 06/22/2021 Time 1:35 PM

## 2021-06-23 ENCOUNTER — Encounter: Payer: Self-pay | Admitting: Internal Medicine

## 2021-06-24 NOTE — Progress Notes (Signed)
Internal Medicine Clinic Attending  Case discussed with Dr. Coe  At the time of the visit.  We reviewed the resident's history and exam and pertinent patient test results.  I agree with the assessment, diagnosis, and plan of care documented in the resident's note.  

## 2021-06-27 ENCOUNTER — Telehealth: Payer: Self-pay | Admitting: Student in an Organized Health Care Education/Training Program

## 2021-06-27 ENCOUNTER — Other Ambulatory Visit: Payer: Self-pay | Admitting: Physical Medicine & Rehabilitation

## 2021-06-27 DIAGNOSIS — M51369 Other intervertebral disc degeneration, lumbar region without mention of lumbar back pain or lower extremity pain: Secondary | ICD-10-CM

## 2021-06-27 DIAGNOSIS — M5136 Other intervertebral disc degeneration, lumbar region: Secondary | ICD-10-CM

## 2021-06-27 NOTE — Telephone Encounter (Signed)
Pt states she was seen on 06/22/2021 and was to have a new referral placed to go to a different pain clinic as she can no longer use [facility] in [place] due 2 no shows.  Please advise if a new referral needs to be placed.

## 2021-06-28 NOTE — Telephone Encounter (Signed)
Placed a new referral for pain management.

## 2021-07-01 ENCOUNTER — Other Ambulatory Visit: Payer: Self-pay | Admitting: Student

## 2021-07-01 ENCOUNTER — Telehealth: Payer: Self-pay | Admitting: *Deleted

## 2021-07-01 DIAGNOSIS — G8929 Other chronic pain: Secondary | ICD-10-CM

## 2021-07-01 MED ORDER — KETOROLAC TROMETHAMINE 10 MG PO TABS
10.0000 mg | ORAL_TABLET | Freq: Two times a day (BID) | ORAL | 0 refills | Status: DC | PRN
Start: 2021-07-01 — End: 2021-08-16

## 2021-07-01 NOTE — Telephone Encounter (Signed)
Mary Gray was called this afternoon. She endorses vaginal discharge that is odorous, that is white/yellow in color.  She is having some dysuria as well.  She denies any worsening of her chronic back pain.  She denies any fevers chills nausea vomiting.  She states that she has had the symptoms in the past and has had multiple negative vaginal swabs for BV, trichomoniasis, and Candida.  Discussed with the patient that I believe that she will need to follow-up in our clinic next week for vaginal swab as the treatments for these various diseases are different.  She is agreeable to this and is scheduled for Tuesday afternoon.  Patient is also continue to endorse low back pain.  States that the ketorolac written by Dr. Marchia Bond last week helped, but she has been out of that medication and has yet to follow-up with a pain clinic.  She denies loss of bowel or bladder function, she denies saddle anesthesia.  Discussed with patient that I will write her for 5 more days of ketorolac and she will need to follow-up in our clinic next week for further discussion of pain medication regimen until she can be seen by a pain management clinic.  Patient is  agreeable to this.  Also discussed that if she has any of the symptoms mentioned above she needs to go to the emergency department immediately.

## 2021-07-01 NOTE — Telephone Encounter (Signed)
Call from pt requesting something for a yeast infection and something for pain, she asked about pain clinic referral also. States she did not realized the office closes early on Fridays. Per chart she has taken Diflucan before; c/o itching "the usual symptoms". Inform pt pain referral is in process; referral coordinator is waiting on a response. States she needs something  for pain before the w/e. Inform I will send her requests to the doctor.

## 2021-07-05 ENCOUNTER — Encounter: Payer: Medicare HMO | Admitting: Internal Medicine

## 2021-07-12 ENCOUNTER — Other Ambulatory Visit: Payer: Self-pay | Admitting: Student in an Organized Health Care Education/Training Program

## 2021-07-12 DIAGNOSIS — I10 Essential (primary) hypertension: Secondary | ICD-10-CM

## 2021-07-22 ENCOUNTER — Other Ambulatory Visit: Payer: Self-pay | Admitting: Student in an Organized Health Care Education/Training Program

## 2021-07-22 DIAGNOSIS — E1165 Type 2 diabetes mellitus with hyperglycemia: Secondary | ICD-10-CM

## 2021-07-24 ENCOUNTER — Other Ambulatory Visit: Payer: Self-pay | Admitting: Student in an Organized Health Care Education/Training Program

## 2021-07-24 DIAGNOSIS — N952 Postmenopausal atrophic vaginitis: Secondary | ICD-10-CM

## 2021-07-25 ENCOUNTER — Other Ambulatory Visit: Payer: Self-pay | Admitting: Physical Medicine & Rehabilitation

## 2021-07-28 ENCOUNTER — Other Ambulatory Visit: Payer: Self-pay | Admitting: Physical Medicine & Rehabilitation

## 2021-07-28 DIAGNOSIS — M48061 Spinal stenosis, lumbar region without neurogenic claudication: Secondary | ICD-10-CM

## 2021-08-05 ENCOUNTER — Other Ambulatory Visit: Payer: Self-pay | Admitting: Student in an Organized Health Care Education/Training Program

## 2021-08-05 DIAGNOSIS — K219 Gastro-esophageal reflux disease without esophagitis: Secondary | ICD-10-CM

## 2021-08-05 NOTE — Telephone Encounter (Signed)
Next appt scheduled 10/17 with PCP. 

## 2021-08-11 ENCOUNTER — Other Ambulatory Visit: Payer: Self-pay | Admitting: Student in an Organized Health Care Education/Training Program

## 2021-08-11 DIAGNOSIS — F411 Generalized anxiety disorder: Secondary | ICD-10-CM

## 2021-08-15 ENCOUNTER — Other Ambulatory Visit: Payer: Self-pay | Admitting: Student in an Organized Health Care Education/Training Program

## 2021-08-15 DIAGNOSIS — M545 Low back pain, unspecified: Secondary | ICD-10-CM

## 2021-08-16 ENCOUNTER — Telehealth: Payer: Self-pay

## 2021-08-16 MED ORDER — KETOROLAC TROMETHAMINE 10 MG PO TABS: 10.0000 mg | ORAL_TABLET | Freq: Two times a day (BID) | ORAL | 0 refills | Status: DC | PRN

## 2021-08-16 NOTE — Telephone Encounter (Signed)
Mary Gray has very poor consistency and follow up with numerous pain clinic referrals. She has chronic low back pain and neuropathy, for which there is no easy solution. I am not inclined to resume prescribing controlled substances, especially not over the phone, and without more information about why she is struggling to establish a long term plan with these pain clinics. We can discuss this more at our upcoming visit.

## 2021-08-16 NOTE — Telephone Encounter (Signed)
Refill Request  ketorolac (TORADOL) 10 MG tablet  gabapentin (NEURONTIN) 300 MG capsule   traMADol (ULTRAM) 50 MG        [id number] [place], [place] - [address] (Ph: [phone])

## 2021-08-16 NOTE — Telephone Encounter (Signed)
Opened in error. SChaplin, RN,BSN  

## 2021-08-16 NOTE — Telephone Encounter (Signed)
Patient with chronic low back pain, multiple referrals over this last year to pain medicine, I think she is established with Dr. Cherylann Ratel. I will decline tramadol for now, will look for latest records from pain medicine clinic so we can be consistent with there plan of care.

## 2021-08-16 NOTE — Telephone Encounter (Signed)
Next appt scheduled 09/05/21 with PCP. 

## 2021-08-16 NOTE — Telephone Encounter (Signed)
Patient called back and asking why her medications were not filled. RN read PCP's response to patient.  She states she was released from Dr. Garnett Farm care d/t "not being able to attend her appointments".  She states she only saw Dr. Cherylann Ratel a couple of times.  Pt also states she has a future appt w/ a new pain management physician on 10/4, she cannot recall his name.  She is c/o ongoing back pain and is requesting the following refills until she is established on 10/4.  She states she is unable to come to clinic d/t transportation issues.  Toradol Tramadol Gabapentin (fallen off med list, pt states she takes 300mg  TID) Tizanidine  Informed patient request will be forwarded to PCP.  Pt was also reminded she has a follow up visit w/ PCP on 09/05/21 and she verbalized understanding. SChaplin, RN,BSN

## 2021-08-19 ENCOUNTER — Other Ambulatory Visit: Payer: Self-pay | Admitting: Student in an Organized Health Care Education/Training Program

## 2021-08-19 DIAGNOSIS — I10 Essential (primary) hypertension: Secondary | ICD-10-CM

## 2021-08-29 DIAGNOSIS — M5136 Other intervertebral disc degeneration, lumbar region: Secondary | ICD-10-CM | POA: Diagnosis not present

## 2021-08-29 DIAGNOSIS — G894 Chronic pain syndrome: Secondary | ICD-10-CM | POA: Diagnosis not present

## 2021-08-29 DIAGNOSIS — Z79891 Long term (current) use of opiate analgesic: Secondary | ICD-10-CM | POA: Diagnosis not present

## 2021-08-29 DIAGNOSIS — M47814 Spondylosis without myelopathy or radiculopathy, thoracic region: Secondary | ICD-10-CM | POA: Diagnosis not present

## 2021-08-29 DIAGNOSIS — M4727 Other spondylosis with radiculopathy, lumbosacral region: Secondary | ICD-10-CM | POA: Diagnosis not present

## 2021-09-05 ENCOUNTER — Encounter: Payer: Medicare HMO | Admitting: Student in an Organized Health Care Education/Training Program

## 2021-09-14 ENCOUNTER — Telehealth: Payer: Self-pay | Admitting: Student in an Organized Health Care Education/Training Program

## 2021-09-14 DIAGNOSIS — F411 Generalized anxiety disorder: Secondary | ICD-10-CM

## 2021-09-14 NOTE — Telephone Encounter (Signed)
Patient with difficulty coming into clinic for our office visits. Missed a visit with me last week. Has been rescheduled for November. Will refer to SW to offer assistance with transportation.

## 2021-09-21 ENCOUNTER — Other Ambulatory Visit: Payer: Self-pay | Admitting: Student in an Organized Health Care Education/Training Program

## 2021-09-21 ENCOUNTER — Other Ambulatory Visit: Payer: Self-pay | Admitting: Physical Medicine & Rehabilitation

## 2021-09-21 ENCOUNTER — Other Ambulatory Visit: Payer: Self-pay

## 2021-09-21 DIAGNOSIS — F411 Generalized anxiety disorder: Secondary | ICD-10-CM

## 2021-09-21 DIAGNOSIS — M62830 Muscle spasm of back: Secondary | ICD-10-CM

## 2021-09-21 DIAGNOSIS — M48061 Spinal stenosis, lumbar region without neurogenic claudication: Secondary | ICD-10-CM

## 2021-09-21 DIAGNOSIS — I10 Essential (primary) hypertension: Secondary | ICD-10-CM

## 2021-09-21 DIAGNOSIS — N952 Postmenopausal atrophic vaginitis: Secondary | ICD-10-CM

## 2021-09-21 NOTE — Telephone Encounter (Signed)
Next appt scheduled  09/23/21 with PCP. 

## 2021-09-21 NOTE — Telephone Encounter (Signed)
Requesting medication for constipation. Please call pt back.

## 2021-09-21 NOTE — Telephone Encounter (Signed)
I would recommend two tabs of senna twice daily and miralax packet once or twice daily as needed. These are over the counter. Give that several days to work. If those are not effective, we can discuss more at our visit in 2 weeks.

## 2021-09-22 NOTE — Telephone Encounter (Signed)
Pt given senna/miralax instructions and verbalized understanding  Pt also requests refills on the following medications:   Gabapentin (med no longer on chart) Tizanidine (med no longer on chart) Amitriptyline   Pt advised to discuss at follow up appt, but requests MD review request now.  Will send to pcp for consideration. Please advise.Kingsley Spittle Cassady11/3/202210:57 AM

## 2021-09-22 NOTE — Telephone Encounter (Signed)
Gabapentin and Tizanidine not recently prescribed by me, not on her med list, and Amitripyline is managed by PMR clinic. I am happy to review them more with her at our next visit. Will hold off on refills of those for now, they are sedating.

## 2021-09-22 NOTE — Telephone Encounter (Signed)
Called pt - no answer and mailbox is full, unable to leave a message. 

## 2021-09-26 NOTE — Telephone Encounter (Signed)
Called pt again about Dr Vincent's response about her medications - no answer.

## 2021-09-28 DIAGNOSIS — M4727 Other spondylosis with radiculopathy, lumbosacral region: Secondary | ICD-10-CM | POA: Diagnosis not present

## 2021-09-28 DIAGNOSIS — M48062 Spinal stenosis, lumbar region with neurogenic claudication: Secondary | ICD-10-CM | POA: Diagnosis not present

## 2021-09-28 DIAGNOSIS — M5136 Other intervertebral disc degeneration, lumbar region: Secondary | ICD-10-CM | POA: Diagnosis not present

## 2021-09-28 DIAGNOSIS — Z79891 Long term (current) use of opiate analgesic: Secondary | ICD-10-CM | POA: Diagnosis not present

## 2021-09-28 DIAGNOSIS — G894 Chronic pain syndrome: Secondary | ICD-10-CM | POA: Diagnosis not present

## 2021-10-03 ENCOUNTER — Encounter: Payer: Medicare HMO | Admitting: Student in an Organized Health Care Education/Training Program

## 2021-10-03 NOTE — Addendum Note (Signed)
Addended by: Erlinda Hong T on: 10/03/2021 10:04 AM   Modules accepted: Orders

## 2021-10-04 ENCOUNTER — Telehealth: Payer: Self-pay | Admitting: *Deleted

## 2021-10-04 NOTE — Chronic Care Management (AMB) (Signed)
  Care Management   Outreach Note  10/04/2021 Name: Mary Gray MRN: [id number] DOB: Apr 16, 1961  Referred by: Tyson Alias, MD Reason for referral : Care Coordination (Initial outreach to schedule referral with BSW)   An unsuccessful telephone outreach was attempted today. The patient was referred to the case management team for assistance with care management and care coordination.   Follow Up Plan:  The care management team will reach out to the patient again over the next 7 days. If patient returns call to provider office, please advise to call Embedded Care Management Care Guide Misty Stanley at [phone].  Gwenevere Ghazi  Care Guide, Embedded Care Coordination [facility] Management  Direct Dial: [phone]

## 2021-10-10 ENCOUNTER — Other Ambulatory Visit: Payer: Self-pay | Admitting: Physical Medicine & Rehabilitation

## 2021-10-10 NOTE — Chronic Care Management (AMB) (Signed)
  Care Management   Outreach Note  10/10/2021 Name: Mary Gray MRN: [id number] DOB: March 07, 1961  Referred by: Tyson Alias, MD Reason for referral : Care Coordination (Initial outreach to schedule referral with BSW)   A second unsuccessful telephone outreach was attempted today. The patient was referred to the case management team for assistance with care management and care coordination.   Follow Up Plan:  The care management team will reach out to the patient again over the next 7 days. If patient returns call to provider office, please advise to call Embedded Care Management Care Guide Misty Stanley at [phone].  Gwenevere Ghazi  Care Guide, Embedded Care Coordination [facility] Management  Direct Dial: [phone]

## 2021-10-11 ENCOUNTER — Other Ambulatory Visit: Payer: Self-pay

## 2021-10-11 DIAGNOSIS — I10 Essential (primary) hypertension: Secondary | ICD-10-CM

## 2021-10-11 MED ORDER — ENALAPRIL MALEATE 20 MG PO TABS: 20.0000 mg | ORAL_TABLET | Freq: Every day | ORAL | 1 refills | Status: DC

## 2021-10-11 NOTE — Telephone Encounter (Signed)
I did refill this medicine two weeks ago. Still haven't seen patient in clinic due to a missed appointment. I have referred her to CCM for support. Maybe they can be helpful here?

## 2021-10-17 NOTE — Chronic Care Management (AMB) (Signed)
  Care Management   Outreach Note  10/17/2021 Name: Mary Gray MRN: [id number] DOB: Nov 28, 1960  Referred by: Tyson Alias, MD Reason for referral : Care Coordination (Initial outreach to schedule referral with BSW)   Third unsuccessful telephone outreach was attempted today. The patient was referred to the case management team for assistance with care management and care coordination. The patient's primary care provider has been notified of our unsuccessful attempts to make or maintain contact with the patient. The care management team is pleased to engage with this patient at any time in the future should he/she be interested in assistance from the care management team.   Follow Up Plan:  We have been unable to make contact with the patient. The care management team is available to follow up with the patient after provider conversation with the patient regarding recommendation for care management engagement and subsequent re-referral to the care management team.   [facility] Guide, Embedded Care Coordination [facility]  Care Management  Direct Dial: [phone]

## 2021-10-20 ENCOUNTER — Other Ambulatory Visit: Payer: Self-pay | Admitting: Student in an Organized Health Care Education/Training Program

## 2021-10-20 DIAGNOSIS — I10 Essential (primary) hypertension: Secondary | ICD-10-CM

## 2021-10-26 DIAGNOSIS — M4727 Other spondylosis with radiculopathy, lumbosacral region: Secondary | ICD-10-CM | POA: Diagnosis not present

## 2021-10-26 DIAGNOSIS — M5136 Other intervertebral disc degeneration, lumbar region: Secondary | ICD-10-CM | POA: Diagnosis not present

## 2021-10-26 DIAGNOSIS — M79604 Pain in right leg: Secondary | ICD-10-CM | POA: Diagnosis not present

## 2021-10-26 DIAGNOSIS — G894 Chronic pain syndrome: Secondary | ICD-10-CM | POA: Diagnosis not present

## 2021-11-07 ENCOUNTER — Encounter: Payer: Self-pay | Admitting: Internal Medicine

## 2021-11-09 ENCOUNTER — Other Ambulatory Visit: Payer: Self-pay | Admitting: Student in an Organized Health Care Education/Training Program

## 2021-11-09 ENCOUNTER — Other Ambulatory Visit: Payer: Self-pay | Admitting: Physical Medicine & Rehabilitation

## 2021-11-09 DIAGNOSIS — E1165 Type 2 diabetes mellitus with hyperglycemia: Secondary | ICD-10-CM

## 2021-11-09 DIAGNOSIS — M48061 Spinal stenosis, lumbar region without neurogenic claudication: Secondary | ICD-10-CM

## 2021-11-09 NOTE — Telephone Encounter (Signed)
Next appt scheduled 11/15/21 with PCP. 

## 2021-11-15 ENCOUNTER — Encounter: Payer: Medicare HMO | Admitting: Student in an Organized Health Care Education/Training Program

## 2021-11-15 ENCOUNTER — Encounter: Payer: Self-pay | Admitting: Internal Medicine

## 2021-11-15 NOTE — Progress Notes (Signed)
Patient no-showed scheduled appt with PCP Dr. Oswaldo Done.  She had been notified by mail and phone call that she must attend or reschedule this appointment to prevent being discharged from our practice.

## 2021-11-21 ENCOUNTER — Other Ambulatory Visit: Payer: Self-pay | Admitting: Student in an Organized Health Care Education/Training Program

## 2021-11-21 DIAGNOSIS — N952 Postmenopausal atrophic vaginitis: Secondary | ICD-10-CM

## 2021-11-30 DIAGNOSIS — M5136 Other intervertebral disc degeneration, lumbar region: Secondary | ICD-10-CM | POA: Diagnosis not present

## 2021-11-30 DIAGNOSIS — G894 Chronic pain syndrome: Secondary | ICD-10-CM | POA: Diagnosis not present

## 2021-11-30 DIAGNOSIS — M48062 Spinal stenosis, lumbar region with neurogenic claudication: Secondary | ICD-10-CM | POA: Diagnosis not present

## 2021-11-30 DIAGNOSIS — M4727 Other spondylosis with radiculopathy, lumbosacral region: Secondary | ICD-10-CM | POA: Diagnosis not present

## 2021-12-01 NOTE — Telephone Encounter (Signed)
A user error has taken place: encounter opened in error, closed for administrative reasons.

## 2021-12-22 ENCOUNTER — Other Ambulatory Visit: Payer: Self-pay | Admitting: Student in an Organized Health Care Education/Training Program

## 2021-12-22 DIAGNOSIS — I10 Essential (primary) hypertension: Secondary | ICD-10-CM

## 2021-12-29 ENCOUNTER — Other Ambulatory Visit: Payer: Self-pay | Admitting: Student in an Organized Health Care Education/Training Program

## 2021-12-29 ENCOUNTER — Other Ambulatory Visit: Payer: Self-pay | Admitting: Physical Medicine & Rehabilitation

## 2021-12-29 DIAGNOSIS — M48061 Spinal stenosis, lumbar region without neurogenic claudication: Secondary | ICD-10-CM

## 2021-12-29 DIAGNOSIS — K219 Gastro-esophageal reflux disease without esophagitis: Secondary | ICD-10-CM

## 2022-01-20 ENCOUNTER — Inpatient Hospital Stay (HOSPITAL_COMMUNITY)
Admission: EM | Admit: 2022-01-20 | Discharge: 2022-01-22 | DRG: 177 | Disposition: A | Discharge status: Home / Self Care | Payer: Medicare HMO | Attending: Internal Medicine | Admitting: Internal Medicine

## 2022-01-20 ENCOUNTER — Other Ambulatory Visit: Payer: Self-pay

## 2022-01-20 ENCOUNTER — Emergency Department (HOSPITAL_COMMUNITY): Payer: Medicare HMO

## 2022-01-20 ENCOUNTER — Encounter (HOSPITAL_COMMUNITY): Payer: Self-pay | Admitting: Emergency Medicine

## 2022-01-20 DIAGNOSIS — E039 Hypothyroidism, unspecified: Secondary | ICD-10-CM | POA: Diagnosis present

## 2022-01-20 DIAGNOSIS — Z79899 Other long term (current) drug therapy: Secondary | ICD-10-CM | POA: Diagnosis not present

## 2022-01-20 DIAGNOSIS — E1165 Type 2 diabetes mellitus with hyperglycemia: Secondary | ICD-10-CM | POA: Diagnosis present

## 2022-01-20 DIAGNOSIS — F17210 Nicotine dependence, cigarettes, uncomplicated: Secondary | ICD-10-CM | POA: Diagnosis present

## 2022-01-20 DIAGNOSIS — Z885 Allergy status to narcotic agent status: Secondary | ICD-10-CM | POA: Diagnosis not present

## 2022-01-20 DIAGNOSIS — Z28310 Unvaccinated for covid-19: Secondary | ICD-10-CM

## 2022-01-20 DIAGNOSIS — R079 Chest pain, unspecified: Secondary | ICD-10-CM | POA: Diagnosis not present

## 2022-01-20 DIAGNOSIS — T402X5A Adverse effect of other opioids, initial encounter: Secondary | ICD-10-CM | POA: Diagnosis present

## 2022-01-20 DIAGNOSIS — R6889 Other general symptoms and signs: Secondary | ICD-10-CM | POA: Diagnosis not present

## 2022-01-20 DIAGNOSIS — Z883 Allergy status to other anti-infective agents status: Secondary | ICD-10-CM | POA: Diagnosis not present

## 2022-01-20 DIAGNOSIS — K5903 Drug induced constipation: Secondary | ICD-10-CM | POA: Diagnosis present

## 2022-01-20 DIAGNOSIS — Z888 Allergy status to other drugs, medicaments and biological substances status: Secondary | ICD-10-CM

## 2022-01-20 DIAGNOSIS — F411 Generalized anxiety disorder: Secondary | ICD-10-CM | POA: Diagnosis present

## 2022-01-20 DIAGNOSIS — Z833 Family history of diabetes mellitus: Secondary | ICD-10-CM

## 2022-01-20 DIAGNOSIS — E119 Type 2 diabetes mellitus without complications: Secondary | ICD-10-CM | POA: Diagnosis not present

## 2022-01-20 DIAGNOSIS — Z79891 Long term (current) use of opiate analgesic: Secondary | ICD-10-CM

## 2022-01-20 DIAGNOSIS — M5136 Other intervertebral disc degeneration, lumbar region: Secondary | ICD-10-CM | POA: Diagnosis present

## 2022-01-20 DIAGNOSIS — U071 COVID-19: Principal | ICD-10-CM | POA: Diagnosis present

## 2022-01-20 DIAGNOSIS — I10 Essential (primary) hypertension: Secondary | ICD-10-CM | POA: Diagnosis present

## 2022-01-20 DIAGNOSIS — Z720 Tobacco use: Secondary | ICD-10-CM | POA: Diagnosis not present

## 2022-01-20 DIAGNOSIS — Z7984 Long term (current) use of oral hypoglycemic drugs: Secondary | ICD-10-CM | POA: Diagnosis not present

## 2022-01-20 DIAGNOSIS — F419 Anxiety disorder, unspecified: Secondary | ICD-10-CM | POA: Diagnosis not present

## 2022-01-20 DIAGNOSIS — G8929 Other chronic pain: Secondary | ICD-10-CM | POA: Diagnosis present

## 2022-01-20 DIAGNOSIS — Z79890 Hormone replacement therapy (postmenopausal): Secondary | ICD-10-CM

## 2022-01-20 DIAGNOSIS — K5900 Constipation, unspecified: Secondary | ICD-10-CM | POA: Diagnosis not present

## 2022-01-20 DIAGNOSIS — J449 Chronic obstructive pulmonary disease, unspecified: Secondary | ICD-10-CM | POA: Diagnosis present

## 2022-01-20 DIAGNOSIS — J9601 Acute respiratory failure with hypoxia: Secondary | ICD-10-CM | POA: Diagnosis present

## 2022-01-20 DIAGNOSIS — R531 Weakness: Secondary | ICD-10-CM | POA: Diagnosis not present

## 2022-01-20 DIAGNOSIS — Z880 Allergy status to penicillin: Secondary | ICD-10-CM

## 2022-01-20 DIAGNOSIS — M48061 Spinal stenosis, lumbar region without neurogenic claudication: Secondary | ICD-10-CM | POA: Diagnosis present

## 2022-01-20 DIAGNOSIS — Z8249 Family history of ischemic heart disease and other diseases of the circulatory system: Secondary | ICD-10-CM | POA: Diagnosis not present

## 2022-01-20 DIAGNOSIS — M549 Dorsalgia, unspecified: Secondary | ICD-10-CM | POA: Diagnosis not present

## 2022-01-20 DIAGNOSIS — R0600 Dyspnea, unspecified: Secondary | ICD-10-CM | POA: Diagnosis not present

## 2022-01-20 LAB — GLUCOSE, CAPILLARY: Glucose-Capillary: 271 mg/dL — ABNORMAL HIGH (ref 70–99)

## 2022-01-20 LAB — CBC WITH DIFFERENTIAL/PLATELET
Abs Immature Granulocytes: 0.01 10*3/uL (ref 0.00–0.07)
Basophils Absolute: 0 10*3/uL (ref 0.0–0.1)
Basophils Relative: 0 %
Eosinophils Absolute: 0.1 10*3/uL (ref 0.0–0.5)
Eosinophils Relative: 3 %
HCT: 37.4 % (ref 36.0–46.0)
Hemoglobin: 12.3 g/dL (ref 12.0–15.0)
Immature Granulocytes: 0 %
Lymphocytes Relative: 29 %
Lymphs Abs: 1 10*3/uL (ref 0.7–4.0)
MCH: 29.9 pg (ref 26.0–34.0)
MCHC: 32.9 g/dL (ref 30.0–36.0)
MCV: 90.8 fL (ref 80.0–100.0)
Monocytes Absolute: 0.3 10*3/uL (ref 0.1–1.0)
Monocytes Relative: 9 %
Neutro Abs: 2 10*3/uL (ref 1.7–7.7)
Neutrophils Relative %: 59 %
Platelets: 167 10*3/uL (ref 150–400)
RBC: 4.12 MIL/uL (ref 3.87–5.11)
RDW: 12.8 % (ref 11.5–15.5)
WBC: 3.4 10*3/uL — ABNORMAL LOW (ref 4.0–10.5)
nRBC: 0 % (ref 0.0–0.2)

## 2022-01-20 LAB — COMPREHENSIVE METABOLIC PANEL
ALT: 36 U/L (ref 0–44)
AST: 79 U/L — ABNORMAL HIGH (ref 15–41)
Albumin: 3.7 g/dL (ref 3.5–5.0)
Alkaline Phosphatase: 54 U/L (ref 38–126)
Anion gap: 9 (ref 5–15)
BUN: 11 mg/dL (ref 8–23)
CO2: 27 mmol/L (ref 22–32)
Calcium: 8.7 mg/dL — ABNORMAL LOW (ref 8.9–10.3)
Chloride: 99 mmol/L (ref 98–111)
Creatinine, Ser: 0.83 mg/dL (ref 0.44–1.00)
GFR, Estimated: >60 mL/min (ref >60)
Glucose, Bld: 161 mg/dL — ABNORMAL HIGH (ref 70–99)
Potassium: 4.4 mmol/L (ref 3.5–5.1)
Sodium: 135 mmol/L (ref 135–145)
Total Bilirubin: 0.9 mg/dL (ref 0.3–1.2)
Total Protein: 6.5 g/dL (ref 6.5–8.1)

## 2022-01-20 LAB — HIV ANTIBODY (ROUTINE TESTING W REFLEX): HIV Screen 4th Generation wRfx: NONREACTIVE

## 2022-01-20 LAB — RESP PANEL BY RT-PCR (FLU A&B, COVID) ARPGX2
Influenza A by PCR: NEGATIVE
Influenza B by PCR: NEGATIVE
SARS Coronavirus 2 by RT PCR: POSITIVE — AB

## 2022-01-20 LAB — HEMOGLOBIN A1C
Hgb A1c MFr Bld: 6 % — ABNORMAL HIGH (ref 4.8–5.6)
Mean Plasma Glucose: 125.5 mg/dL

## 2022-01-20 LAB — LACTATE DEHYDROGENASE: LDH: 160 U/L (ref 98–192)

## 2022-01-20 LAB — TROPONIN I (HIGH SENSITIVITY)
Troponin I (High Sensitivity): 4 ng/L (ref <18)
Troponin I (High Sensitivity): 5 ng/L (ref <18)

## 2022-01-20 LAB — LIPASE, BLOOD: Lipase: 25 U/L (ref 11–51)

## 2022-01-20 LAB — C-REACTIVE PROTEIN: CRP: 2.1 mg/dL — ABNORMAL HIGH (ref <1.0)

## 2022-01-20 LAB — D-DIMER, QUANTITATIVE: D-Dimer, Quant: 0.98 ug/mL-FEU — ABNORMAL HIGH (ref 0.00–0.50)

## 2022-01-20 LAB — TSH: TSH: 0.39 u[IU]/mL (ref 0.350–4.500)

## 2022-01-20 LAB — FERRITIN: Ferritin: 295 ng/mL (ref 11–307)

## 2022-01-20 MED ORDER — SODIUM CHLORIDE 0.9 % IV SOLN
100.0000 mg | Freq: Every day | INTRAVENOUS | Status: DC
  Administered 2022-01-21: 100 mg via INTRAVENOUS
  Filled 2022-01-20 (×2): qty 20

## 2022-01-20 MED ORDER — ONDANSETRON HCL 4 MG PO TABS: 4.0000 mg | ORAL_TABLET | Freq: Four times a day (QID) | ORAL | Status: DC | PRN

## 2022-01-20 MED ORDER — ENOXAPARIN SODIUM 40 MG/0.4ML IJ SOSY: 40.0000 mg | PREFILLED_SYRINGE | INTRAMUSCULAR | Status: DC

## 2022-01-20 MED ORDER — INSULIN ASPART 100 UNIT/ML IJ SOLN: 0.0000 [IU] | Freq: Three times a day (TID) | INTRAMUSCULAR | Status: DC

## 2022-01-20 MED ORDER — ASPIRIN EC 81 MG PO TBEC: 81.0000 mg | DELAYED_RELEASE_TABLET | Freq: Every day | ORAL | Status: DC

## 2022-01-20 MED ORDER — NICOTINE 21 MG/24HR TD PT24
21.0000 mg | MEDICATED_PATCH | Freq: Every day | TRANSDERMAL | Status: DC
  Filled 2022-01-20 (×2): qty 1

## 2022-01-20 MED ORDER — MELATONIN 3 MG PO TABS
9.0000 mg | ORAL_TABLET | Freq: Every day | ORAL | Status: DC
  Administered 2022-01-20 – 2022-01-21 (×2): 9 mg via ORAL
  Filled 2022-01-20 (×2): qty 3

## 2022-01-20 MED ORDER — ALBUTEROL SULFATE HFA 108 (90 BASE) MCG/ACT IN AERS
2.0000 | INHALATION_SPRAY | RESPIRATORY_TRACT | Status: DC | PRN
  Administered 2022-01-20: 2 via RESPIRATORY_TRACT
  Filled 2022-01-20: qty 6.7

## 2022-01-20 MED ORDER — ALBUTEROL SULFATE (2.5 MG/3ML) 0.083% IN NEBU
2.5000 mg | INHALATION_SOLUTION | Freq: Four times a day (QID) | RESPIRATORY_TRACT | Status: DC
  Administered 2022-01-20 – 2022-01-21 (×2): 2.5 mg via RESPIRATORY_TRACT
  Filled 2022-01-20 (×4): qty 3

## 2022-01-20 MED ORDER — SODIUM CHLORIDE 0.9 % IV SOLN
200.0000 mg | Freq: Once | INTRAVENOUS | Status: AC
  Administered 2022-01-20: 200 mg via INTRAVENOUS
  Filled 2022-01-20: qty 40

## 2022-01-20 MED ORDER — INSULIN GLARGINE-YFGN 100 UNIT/ML ~~LOC~~ SOLN: 15.0000 [IU] | Freq: Every day | SUBCUTANEOUS | Status: DC

## 2022-01-20 MED ORDER — ZINC OXIDE 40 % EX OINT
TOPICAL_OINTMENT | CUTANEOUS | Status: DC | PRN
  Filled 2022-01-20: qty 57

## 2022-01-20 MED ORDER — DEXAMETHASONE 6 MG PO TABS
6.0000 mg | ORAL_TABLET | Freq: Every day | ORAL | Status: AC
  Administered 2022-01-21 – 2022-01-22 (×2): 6 mg via ORAL
  Filled 2022-01-20 (×2): qty 1

## 2022-01-20 MED ORDER — HYDROXYZINE HCL 25 MG PO TABS
25.0000 mg | ORAL_TABLET | Freq: Two times a day (BID) | ORAL | Status: DC | PRN
  Administered 2022-01-20 – 2022-01-22 (×3): 25 mg via ORAL
  Filled 2022-01-20 (×4): qty 1

## 2022-01-20 MED ORDER — DOCUSATE SODIUM 100 MG PO CAPS
100.0000 mg | ORAL_CAPSULE | Freq: Two times a day (BID) | ORAL | Status: DC
  Administered 2022-01-20 – 2022-01-22 (×4): 100 mg via ORAL
  Filled 2022-01-20 (×4): qty 1

## 2022-01-20 MED ORDER — METHYLPREDNISOLONE SODIUM SUCC 125 MG IJ SOLR
1.0000 mg/kg | Freq: Two times a day (BID) | INTRAMUSCULAR | Status: DC
  Administered 2022-01-20: 88.75 mg via INTRAVENOUS
  Filled 2022-01-20: qty 2

## 2022-01-20 MED ORDER — LEVOTHYROXINE SODIUM 88 MCG PO TABS
88.0000 ug | ORAL_TABLET | Freq: Every day | ORAL | Status: DC
  Administered 2022-01-22: 88 ug via ORAL
  Filled 2022-01-20: qty 1

## 2022-01-20 MED ORDER — ONDANSETRON HCL 4 MG/2ML IJ SOLN
4.0000 mg | Freq: Four times a day (QID) | INTRAMUSCULAR | Status: DC | PRN
Start: 2022-01-20 — End: 2022-01-22

## 2022-01-20 MED ORDER — POLYETHYLENE GLYCOL 3350 17 G PO PACK
17.0000 g | PACK | Freq: Every day | ORAL | Status: DC | PRN
  Administered 2022-01-20: 17 g via ORAL
  Filled 2022-01-20: qty 1

## 2022-01-20 MED ORDER — METFORMIN HCL ER 500 MG PO TB24
1000.0000 mg | ORAL_TABLET | Freq: Two times a day (BID) | ORAL | Status: DC
  Administered 2022-01-21 – 2022-01-22 (×3): 1000 mg via ORAL
  Filled 2022-01-20 (×4): qty 2

## 2022-01-20 MED ORDER — HYDROCODONE-ACETAMINOPHEN 5-325 MG PO TABS
1.0000 | ORAL_TABLET | ORAL | Status: DC | PRN
  Administered 2022-01-20 – 2022-01-22 (×4): 1 via ORAL
  Filled 2022-01-20 (×5): qty 1

## 2022-01-20 NOTE — ED Triage Notes (Signed)
Pt brought to ED by [facility] ambulatory to triage for evaluation of flu-like symptoms. Pt endorses shortness of breath and left sided chest pain accompanied by dizziness, weakness and cough x3 days.  ?

## 2022-01-20 NOTE — H&P (Addendum)
Date: 01/20/2022               Patient Name:  Mary Gray MRN: [id number]  DOB: 04-Aug-1961 Age / Sex: 61 y.o., female   PCP: Pcp, No         Medical Service: Internal Medicine Teaching Service         Attending Physician: Dr. Earl Lagos, MD    First Contact: Dr. Elza Rafter, DO Pager: [phone]  Second Contact: Dr. Sharrell Ku, MD Pager: [phone]       After Hours (After 5p/  First Contact Pager: [phone]  weekends / holidays): Second Contact Pager: [phone]   Chief Complaint: Cough/SOB  History of Present Illness: 61 year old lady with PMH hypertension, tobacco use disorder, hypothyroidism, T2DM, anxiety, depression, chronic pain, degenerative disc disease and spinal stenosis who presented to the ER for worsening cough.  Patient reports she lives with her fianc who was recently diagnosed with COVID.  On February 28, she started feeling ill with cough, body aches and a fever of 102.9.  She tried OTC Tylenol, Advil, and cough medicine over the last few days. Her fever has resolved but she continues to have ongoing cough. States her chest feels tight when she coughs. She reports associated nausea, headache, and shortness of breath but denies any vomiting, diarrhea, loss of taste or smell. Patient also reports chronic back pain as well as constipation.  Of note, patient states she has not received the COVID-vaccine and does not want this vaccine. Patient states she would like to get the flu or pneumonia vaccine if available.  Meds:  Current Meds  Medication Sig   acetaminophen (TYLENOL) 500 MG tablet Take 1,000 mg by mouth every 6 (six) hours as needed for mild pain.   amLODipine (NORVASC) 5 MG tablet TAKE 1 TABLET (5 MG TOTAL) BY MOUTH DAILY.   aspirin EC 81 MG tablet Take 81 mg by mouth daily. Swallow whole.   [employer]S VITAMIN B12 1000 MCG tablet TAKE 1 TABLET BY MOUTH EVERY DAY (Patient taking differently: Take 1,000 mcg by mouth daily.)   enalapril (VASOTEC) 20 MG tablet  Take 1 tablet (20 mg total) by mouth daily.   esomeprazole (NEXIUM) 40 MG capsule TAKE 1 CAPSULE BY MOUTH EVERY DAY (Patient taking differently: 40 mg daily.)   furosemide (LASIX) 40 MG tablet Take 40 mg by mouth daily.   HYDROcodone-acetaminophen (NORCO/VICODIN) 5-325 MG tablet Take 1 tablet by mouth every 4 (four) hours as needed for pain.   ibuprofen (ADVIL) 200 MG tablet Take 400 mg by mouth every 6 (six) hours as needed for fever, headache or mild pain.   levothyroxine (SYNTHROID) 88 MCG tablet TAKE 1 TABLET BY MOUTH EVERY DAY BEFORE BREAKFAST (Patient taking differently: Take 88 mcg by mouth daily before breakfast.)   Melatonin 10 MG TABS Take 10 mg by mouth at bedtime.   metFORMIN (GLUCOPHAGE-XR) 500 MG 24 hr tablet TAKE 2 TABLETS BY MOUTH TWICE A DAY (Patient taking differently: 1,000 mg 2 (two) times daily.)   multivitamin-iron-minerals-folic acid (CENTRUM) chewable tablet Chew 1 tablet by mouth daily.   naloxegol oxalate (MOVANTIK) 25 MG TABS tablet Take 25 mg by mouth daily.   PREMARIN vaginal cream PLACE 1 APPLICATORFUL VAGINALLY DAILY. (Patient taking differently: 1 applicator 2 (two) times a week.)    Allergies: Allergies as of 01/20/2022 - Review Complete 01/20/2022  Allergen Reaction Noted   Codeine sulfate Other (See Comments) 06/27/2006   Meperidine and related Nausea And Vomiting 12/30/2011  Minocycline hcl Nausea And Vomiting 06/27/2006   Penicillins Other (See Comments) 06/27/2006   Propoxyphene n-acetaminophen Other (See Comments) 06/27/2006   Tetracycline hcl Other (See Comments) 06/27/2006   Past Medical History:  Diagnosis Date   Back pain    Chronic interstitial cystitis    Followed by Dr. Marcine Matar   COPD (chronic obstructive pulmonary disease) (HCC)    DDD (degenerative disc disease), lumbar    Degeneration of cervical intervertebral disc    Depression    Diabetes mellitus, type 2 (HCC) 2007   Heme positive stool    EGD on 04/08/2010 by Dr. Ewing Schlein  showed chronic gastritis and a few gastric polyps; pathology showed a fundic gland polyp.  Colonoscopy on 04/08/2010 showed small external and internal hemorrhoids, and a few benign-appearing diminutive polyps in the rectum, the distal sigmoid colon, and in the distal descending colon; pathology showed hyperplastic polyps.    Hypertension    Hypothyroidism    Iron deficiency anemia    Spinal stenosis     Family History: History of diabetes in mother, sister and aunt. History of MI in mother and father.  Social History: Patient lives lives with her fianc in a rented room from a friend.  Her mobility is limited due to pain and usually ambulates with a cane. Independent with ADLs. Smokes about half a pack of cigarettes per day and has been smoking since she was 16. Denies any EtOH or illicit drug use.  Review of Systems: A complete ROS was negative except as per HPI.   Physical Exam: Blood pressure 123/60, pulse 72, temperature 98.4 F (36.9 C), temperature source Oral, resp. rate 15, height 5\' 1"  (1.549 m), weight 88.5 kg, last menstrual period 01/17/2009, SpO2 99 %.  General: Pleasant, well-appearing elderly female laying in bed. No acute distress. Head: Normocephalic. Atraumatic. CV: RRR. No murmurs, rubs, or gallops. No LE edema Pulmonary: On 1 L [place]. Lungs CTAB. Normal effort. Mild expiratory wheezes bilaterally.  No increased WOB.  Abdominal: Soft.  Mildly distended. Mild tenderness diffusely.  Normal bowel sounds. Extremities: Radial and DP pulses 2+ and symmetric. Normal ROM. Skin: Warm and dry. No obvious rash or lesions. Neuro: A&Ox3. Moves all extremities. Normal sensation. No focal deficit. Psych: Normal mood and affect  EKG: personally reviewed my interpretation is sinus rhythm  CXR: personally reviewed my interpretation is no acute cardiopulmonary disease  Assessment & Plan by Problem: Principal Problem:   COVID-19 virus infection  COVID-19 infection Unvaccinated  patient presenting to the ER for upper respiratory symptoms found to be COVID-positive. Patient exposed to her fianc who was recently diagnosed with COVID. Symptoms started February 28th with cough, fever of 102.9, muscle aches and nausea.  Fever has not improved with OTC Tylenol patient continues to have ongoing cough shortness of breath.  Patient desatted in the ER to 87% on room air.  On exam, no increased work of breathing but there is mild expiratory wheezes on auscultation. Chest x-ray unremarkable. Patient on minimal oxygen requirements, will admit for observation. --S/p IV Solu-Medrol x1 dose --Start dexamethasone 6 mg daily --Remdesivir for 5 days --Follow-up inflammatory markers --PRN Albuterol nebulizer and inhaler  Hypertension Patient's BP is currently stable with SBP in the 100s to 120s. Home regimen includes enalapril 20 mg daily and amlodipine 5 mg daily.  We will hold antihypertensive regimen in the setting of soft BPs. --Hold Enalapril and amlodipine  Chronic back pain Patient with a history of degenerative disc disease and spinal stenosis.  Follows  with pain specialist Dr. Jordan Likes.  --Continue home Norco 5-325 mg every 4 hours as needed for pain  Controlled T2DM Last A1c 5.9% 10 months ago.  Patient currently on metformin 1000 mg twice daily. --Resume metformin 1000 mg twice daily --Repeat A1c --SSI, and CBG checks  Hypothyroidism Last TSH 2.56 in 02/2021.  -- Repeat TSH --Continue Synthroid 88 mcg daily  Tobacco use disorder Patient with significant tobacco use since the age of 13. Currently down to half a pack per day but has not smoked since she started feeling sick on February 28th. --Nicotine patch  Anxiety Resume home hydroxyzine 25 mg twice daily as needed for anxiety  Constipation Secondary to chronic opioid use. Patient states she has not moved her bowels in the last 7 days. Patient slightly tender to palpation diffusely in the abdomen with mildly  distended abdomen. --Scheduled Colace 100 mg twice daily --As needed MiraLAX daily  CODE STATUS: Full code DIET: Regular PPx: Lovenox  Dispo: Admit patient to Observation with expected length of stay less than 2 midnights.  Signed: Steffanie Rainwater, MD 01/20/2022, 4:26 PM  Pager: [phone] Internal Medicine Teaching Service After 5pm on weekdays and 1pm on weekends: On Call pager: [phone]

## 2022-01-20 NOTE — ED Provider Triage Note (Signed)
Emergency Medicine Provider Triage Evaluation Note ? ?Mary Gray , a 61 y.o. female  was evaluated in triage.  Pt complains of shortness of breath, cough, body aches, chest pain, generalized weakness, nausea.  Symptoms have been present for 4 days.  Significant other here with similar symptoms.  No known fevers.  Patient reports left-sided chest pain, feels similar to when she has previously had pneumonia. ? ?Review of Systems  ?Positive: Chest pain, shortness of breath, cough, body aches, abdominal pain, nausea ?Negative: Fevers, dysuria ? ?Physical Exam  ?BP 114/74   Pulse 80   Temp 99.3 ?F (37.4 ?C) (Oral)   Resp 18   Ht 5\' 1"  (1.549 m)   Wt 88.5 kg   LMP 01/17/2009   SpO2 94%   BMI 36.87 kg/m?  ?Gen:   Awake, no distress   ?Resp:  Normal effort, CTA Bilat ?MSK:   Moves extremities without difficulty  ?Other:   ? ?Medical Decision Making  ?Medically screening exam initiated at 6:51 AM.  Appropriate orders placed.  Mary Gray was informed that the remainder of the evaluation will be completed by another provider, this initial triage assessment does not replace that evaluation, and the importance of remaining in the ED until their evaluation is complete. ? ? ?  ?Jacqlyn Larsen, [place] ?01/20/22 [id number] ? ?

## 2022-01-20 NOTE — ED Provider Notes (Signed)
[facility] EMERGENCY DEPARTMENT  Provider Note  CSN: [id number] Arrival date & time: 01/20/22 5102  History Chief Complaint  Patient presents with   Shortness of Breath   Weakness    Mary Gray is a 61 y.o. female with history of HTN, DM, tobacco abuse (documented history of COPD but she is not on any inhalers) reports 4 days of cough, productive of brown sputum, increased SOB, body aches and fevers. She has pain with deep breath and coughing. No vomiting or diarrhea. Fiance has had similar symptoms recently.    Home Medications Prior to Admission medications   Medication Sig Start Date End Date Taking? Authorizing Provider  acetaminophen (TYLENOL) 500 MG tablet Take 1,000 mg by mouth every 6 (six) hours as needed for mild pain.   Yes [provider]  amLODipine (NORVASC) 5 MG tablet TAKE 1 TABLET (5 MG TOTAL) BY MOUTH DAILY. 05/12/21 01/20/22 Yes Tyson Alias, MD  aspirin EC 81 MG tablet Take 81 mg by mouth daily. Swallow whole.   Yes [provider]  CVS VITAMIN B12 1000 MCG tablet TAKE 1 TABLET BY MOUTH EVERY DAY Patient taking differently: Take 1,000 mcg by mouth daily. 01/12/21  Yes Tyson Alias, MD  enalapril (VASOTEC) 20 MG tablet Take 1 tablet (20 mg total) by mouth daily. 10/11/21  Yes Tyson Alias, MD  esomeprazole (NEXIUM) 40 MG capsule TAKE 1 CAPSULE BY MOUTH EVERY DAY Patient taking differently: 40 mg daily. 08/05/21  Yes Tyson Alias, MD  furosemide (LASIX) 40 MG tablet Take 40 mg by mouth daily.   Yes [provider]  HYDROcodone-acetaminophen (NORCO/VICODIN) 5-325 MG tablet Take 1 tablet by mouth every 4 (four) hours as needed for pain. 12/30/21  Yes [provider]  ibuprofen (ADVIL) 200 MG tablet Take 400 mg by mouth every 6 (six) hours as needed for fever, headache or mild pain.   Yes [provider]  levothyroxine (SYNTHROID) 88 MCG tablet TAKE 1 TABLET BY MOUTH  EVERY DAY BEFORE BREAKFAST Patient taking differently: Take 88 mcg by mouth daily before breakfast. 02/09/21  Yes Tyson Alias, MD  Melatonin 10 MG TABS Take 10 mg by mouth at bedtime.   Yes [provider]  metFORMIN (GLUCOPHAGE-XR) 500 MG 24 hr tablet TAKE 2 TABLETS BY MOUTH TWICE A DAY Patient taking differently: 1,000 mg 2 (two) times daily. 11/09/21  Yes Tyson Alias, MD  multivitamin-iron-minerals-folic acid (CENTRUM) chewable tablet Chew 1 tablet by mouth daily. 07/12/20  Yes Tyson Alias, MD  naloxegol oxalate (MOVANTIK) 25 MG TABS tablet Take 25 mg by mouth daily.   Yes [provider]  PREMARIN vaginal cream PLACE 1 APPLICATORFUL VAGINALLY DAILY. Patient taking differently: 1 applicator 2 (two) times a week. 11/23/21  Yes Tyson Alias, MD  Accu-Chek Softclix Lancets lancets Check blood sugar 1 time per day 07/12/20   Tyson Alias, MD  glucose blood (ACCU-CHEK GUIDE) test strip CHECK BLOOD SUGAR 1 TIME PER DAY 05/12/21   Tyson Alias, MD  hydrOXYzine (ATARAX/VISTARIL) 25 MG tablet TAKE 1 TABLET (25 MG TOTAL) BY MOUTH 2 (TWO) TIMES DAILY AS NEEDED FOR ANXIETY. FOR ANXIETY Patient not taking: Reported on 01/20/2022 09/21/21   Tyson Alias, MD  nystatin (NYSTATIN) powder Apply 1 application topically 3 (three) times daily. Patient not taking: Reported on 01/20/2022 03/10/21   [name], MD  pravastatin (PRAVACHOL) 10 MG tablet TAKE 1 TABLET BY MOUTH EVERY DAY IN THE  EVENING 01/16/19 10/15/20  Tyson Alias, MD     Allergies    Codeine sulfate, Meperidine and related, Minocycline hcl, Penicillins, Propoxyphene n-acetaminophen, and Tetracycline hcl   Review of Systems   Review of Systems Please see HPI for pertinent positives and negatives  Physical Exam BP (!) 84/65    Pulse 84    Temp 98.4 F (36.9 C) (Oral)    Resp 16    Ht 5\' 1"  (1.549 m)    Wt 88.5 kg    LMP 01/17/2009    SpO2 91%    BMI 36.87  kg/m   Physical Exam Vitals and nursing note reviewed.  Constitutional:      Appearance: Normal appearance.  HENT:     Head: Normocephalic and atraumatic.     Nose: Nose normal.     Mouth/Throat:     Mouth: Mucous membranes are moist.  Eyes:     Extraocular Movements: Extraocular movements intact.     Conjunctiva/sclera: Conjunctivae normal.  Cardiovascular:     Rate and Rhythm: Normal rate.  Pulmonary:     Effort: Pulmonary effort is normal.     Breath sounds: Wheezing (occasional end expiratory) present. No rales.  Abdominal:     General: Abdomen is flat.     Palpations: Abdomen is soft.     Tenderness: There is no abdominal tenderness.  Musculoskeletal:        General: No swelling. Normal range of motion.     Cervical back: Neck supple.  Skin:    General: Skin is warm and dry.  Neurological:     General: No focal deficit present.     Mental Status: She is alert.  Psychiatric:        Mood and Affect: Mood normal.    ED Results / Procedures / Treatments   EKG EKG Interpretation  Date/Time:  Friday January 20 2022 12:34:16 EST Ventricular Rate:  79 PR Interval:  128 QRS Duration: 105 QT Interval:  370 QTC Calculation: 425 R Axis:   66 Text Interpretation: Sinus rhythm Low voltage, precordial leads Normal ECG Since last tracing Rate slower Confirmed by 12-15-1971 [phone]) on 01/20/2022 12:38:18 PM  Procedures .Critical Care Performed by: 03/22/2022, MD Authorized by: Pollyann Savoy, MD   Critical care provider statement:    Critical care time (minutes):  30   Critical care time was exclusive of:  Separately billable procedures and treating other patients   Critical care was necessary to treat or prevent imminent or life-threatening deterioration of the following conditions:  Respiratory failure   Critical care was time spent personally by me on the following activities:  Development of treatment plan with patient or surrogate, discussions with  consultants, evaluation of patient's response to treatment, examination of patient, ordering and review of laboratory studies, ordering and review of radiographic studies, ordering and performing treatments and interventions, pulse oximetry, re-evaluation of patient's condition and review of old charts   Care discussed with: admitting provider    Medications Ordered in the ED Medications  albuterol (VENTOLIN HFA) 108 (90 Base) MCG/ACT inhaler 2 puff (2 puffs Inhalation Given 01/20/22 1203)  methylPREDNISolone sodium succinate (SOLU-MEDROL) 125 mg/2 mL injection 88.75 mg (has no administration in time range)  remdesivir 200 mg in sodium chloride 0.9% 250 mL IVPB (has no administration in time range)    Followed by  remdesivir 100 mg in sodium chloride 0.9 % 100 mL IVPB (has no administration in time range)    Initial  Impression and Plan  Patient with symptoms of URI/bronchitis. Febrile at home but improved with APAP before arrival. Labs done in triage show CBC with mild leukopenia, BMP with mild hyperglycemia, otherwise normal trop and lipase. Covid was not yet collected. I personally viewed the images from radiology studies and agree with radiologist interpretation: CXR is clear. Will give 2 puffs of albuterol for wheezing/coughing and send Covid swab.    ED Course   Clinical Course as of 01/20/22 1450  Fri Jan 20, 2022  1227 Patient's Marchia Meiers is also a patient here and has just tested positive for Covid. [CS]  1339 Patient reported constipation and abdominal discomfort to RN. She has a benign abdomen and is eating a sandwich at the time of my re-eval. No concern for acute process. She also reportedly dropped SpO2 to 88% and was placed on 2L [place]. Will check another room air and then ambulatory SpO2 if she maintains above 90%, may be able to go home.  [CS]  1356 Per EMT, patient able to maintain SpO2 97% while walking in the room.  [CS]  1357 Repeat Trop remains normal.  [CS]  1401 At rest in the  room, she has dropped her SpO2 to 87%, will restart [place] oxygen, anticipate admission.  [CS]  1434 Covid is confirmed positive. Solumedrol and Remdesivir ordered and will admit due to hypoxia.  [CS]  36 Spoke with IM resident who will evaluate for admission.  [CS]    Clinical Course User Index [CS] Pollyann Savoy, MD     MDM Rules/Calculators/A&P Medical Decision Making Problems Addressed: Acute respiratory failure with hypoxia [facility]): acute illness or injury that poses a threat to life or bodily functions COVID-19: acute illness or injury that poses a threat to life or bodily functions  Amount and/or Complexity of Data Reviewed Labs: ordered. Decision-making details documented in ED Course. Radiology: ordered and independent interpretation performed. Decision-making details documented in ED Course. ECG/medicine tests: ordered and independent interpretation performed. Decision-making details documented in ED Course.  Risk Prescription drug management. Decision regarding hospitalization.    Final Clinical Impression(s) / ED Diagnoses Final diagnoses:  COVID-19  Acute respiratory failure with hypoxia [facility])    Rx / DC Orders ED Discharge Orders     None        Pollyann Savoy, MD 01/20/22 1450

## 2022-01-21 DIAGNOSIS — U071 COVID-19: Secondary | ICD-10-CM | POA: Diagnosis present

## 2022-01-21 DIAGNOSIS — Z8249 Family history of ischemic heart disease and other diseases of the circulatory system: Secondary | ICD-10-CM | POA: Diagnosis not present

## 2022-01-21 DIAGNOSIS — Z7984 Long term (current) use of oral hypoglycemic drugs: Secondary | ICD-10-CM | POA: Diagnosis not present

## 2022-01-21 DIAGNOSIS — F17210 Nicotine dependence, cigarettes, uncomplicated: Secondary | ICD-10-CM | POA: Diagnosis present

## 2022-01-21 DIAGNOSIS — Z79899 Other long term (current) drug therapy: Secondary | ICD-10-CM | POA: Diagnosis not present

## 2022-01-21 DIAGNOSIS — E1165 Type 2 diabetes mellitus with hyperglycemia: Secondary | ICD-10-CM | POA: Diagnosis present

## 2022-01-21 DIAGNOSIS — K5900 Constipation, unspecified: Secondary | ICD-10-CM | POA: Diagnosis not present

## 2022-01-21 DIAGNOSIS — Z79890 Hormone replacement therapy (postmenopausal): Secondary | ICD-10-CM | POA: Diagnosis not present

## 2022-01-21 DIAGNOSIS — M5136 Other intervertebral disc degeneration, lumbar region: Secondary | ICD-10-CM | POA: Diagnosis present

## 2022-01-21 DIAGNOSIS — Z720 Tobacco use: Secondary | ICD-10-CM | POA: Diagnosis not present

## 2022-01-21 DIAGNOSIS — E039 Hypothyroidism, unspecified: Secondary | ICD-10-CM

## 2022-01-21 DIAGNOSIS — K5903 Drug induced constipation: Secondary | ICD-10-CM | POA: Diagnosis present

## 2022-01-21 DIAGNOSIS — Z833 Family history of diabetes mellitus: Secondary | ICD-10-CM | POA: Diagnosis not present

## 2022-01-21 DIAGNOSIS — Z880 Allergy status to penicillin: Secondary | ICD-10-CM | POA: Diagnosis not present

## 2022-01-21 DIAGNOSIS — G8929 Other chronic pain: Secondary | ICD-10-CM

## 2022-01-21 DIAGNOSIS — I10 Essential (primary) hypertension: Secondary | ICD-10-CM | POA: Diagnosis present

## 2022-01-21 DIAGNOSIS — Z885 Allergy status to narcotic agent status: Secondary | ICD-10-CM | POA: Diagnosis not present

## 2022-01-21 DIAGNOSIS — E119 Type 2 diabetes mellitus without complications: Secondary | ICD-10-CM | POA: Diagnosis not present

## 2022-01-21 DIAGNOSIS — M549 Dorsalgia, unspecified: Secondary | ICD-10-CM

## 2022-01-21 DIAGNOSIS — Z888 Allergy status to other drugs, medicaments and biological substances status: Secondary | ICD-10-CM | POA: Diagnosis not present

## 2022-01-21 DIAGNOSIS — T402X5A Adverse effect of other opioids, initial encounter: Secondary | ICD-10-CM | POA: Diagnosis present

## 2022-01-21 DIAGNOSIS — F419 Anxiety disorder, unspecified: Secondary | ICD-10-CM | POA: Diagnosis not present

## 2022-01-21 DIAGNOSIS — J9601 Acute respiratory failure with hypoxia: Secondary | ICD-10-CM | POA: Diagnosis present

## 2022-01-21 DIAGNOSIS — J449 Chronic obstructive pulmonary disease, unspecified: Secondary | ICD-10-CM | POA: Diagnosis present

## 2022-01-21 DIAGNOSIS — M48061 Spinal stenosis, lumbar region without neurogenic claudication: Secondary | ICD-10-CM | POA: Diagnosis present

## 2022-01-21 DIAGNOSIS — Z79891 Long term (current) use of opiate analgesic: Secondary | ICD-10-CM | POA: Diagnosis not present

## 2022-01-21 DIAGNOSIS — Z883 Allergy status to other anti-infective agents status: Secondary | ICD-10-CM | POA: Diagnosis not present

## 2022-01-21 DIAGNOSIS — F411 Generalized anxiety disorder: Secondary | ICD-10-CM | POA: Diagnosis present

## 2022-01-21 DIAGNOSIS — Z28310 Unvaccinated for covid-19: Secondary | ICD-10-CM | POA: Diagnosis not present

## 2022-01-21 LAB — BASIC METABOLIC PANEL
Anion gap: 14 (ref 5–15)
BUN: 11 mg/dL (ref 8–23)
CO2: 24 mmol/L (ref 22–32)
Calcium: 8.8 mg/dL — ABNORMAL LOW (ref 8.9–10.3)
Chloride: 96 mmol/L — ABNORMAL LOW (ref 98–111)
Creatinine, Ser: 0.73 mg/dL (ref 0.44–1.00)
GFR, Estimated: >60 mL/min (ref >60)
Glucose, Bld: 156 mg/dL — ABNORMAL HIGH (ref 70–99)
Potassium: 4.7 mmol/L (ref 3.5–5.1)
Sodium: 134 mmol/L — ABNORMAL LOW (ref 135–145)

## 2022-01-21 LAB — CBC
HCT: 34.4 % — ABNORMAL LOW (ref 36.0–46.0)
Hemoglobin: 11.7 g/dL — ABNORMAL LOW (ref 12.0–15.0)
MCH: 29.8 pg (ref 26.0–34.0)
MCHC: 34 g/dL (ref 30.0–36.0)
MCV: 87.5 fL (ref 80.0–100.0)
Platelets: 148 10*3/uL — ABNORMAL LOW (ref 150–400)
RBC: 3.93 MIL/uL (ref 3.87–5.11)
RDW: 12.3 % (ref 11.5–15.5)
WBC: 4 10*3/uL (ref 4.0–10.5)
nRBC: 0 % (ref 0.0–0.2)

## 2022-01-21 LAB — GLUCOSE, CAPILLARY
Glucose-Capillary: 139 mg/dL — ABNORMAL HIGH (ref 70–99)
Glucose-Capillary: 249 mg/dL — ABNORMAL HIGH (ref 70–99)
Glucose-Capillary: 181 mg/dL — ABNORMAL HIGH (ref 70–99)
Glucose-Capillary: 196 mg/dL — ABNORMAL HIGH (ref 70–99)

## 2022-01-21 MED ORDER — SENNOSIDES-DOCUSATE SODIUM 8.6-50 MG PO TABS
1.0000 | ORAL_TABLET | Freq: Once | ORAL | Status: AC
  Administered 2022-01-21: 1 via ORAL
  Filled 2022-01-21: qty 1

## 2022-01-21 MED ORDER — POLYETHYLENE GLYCOL 3350 17 G PO PACK
17.0000 g | PACK | Freq: Once | ORAL | Status: AC
  Administered 2022-01-21: 17 g via ORAL
  Filled 2022-01-21: qty 1

## 2022-01-21 MED ORDER — POLYETHYLENE GLYCOL 3350 17 G PO PACK: 17.0000 g | PACK | Freq: Every day | ORAL | Status: DC | PRN

## 2022-01-21 MED ORDER — INSULIN ASPART 100 UNIT/ML IJ SOLN
0.0000 [IU] | Freq: Three times a day (TID) | INTRAMUSCULAR | Status: DC
  Administered 2022-01-21: 3 [IU] via SUBCUTANEOUS
  Administered 2022-01-21: 5 [IU] via SUBCUTANEOUS
  Administered 2022-01-22: 3 [IU] via SUBCUTANEOUS

## 2022-01-21 MED ORDER — ENOXAPARIN SODIUM 40 MG/0.4ML IJ SOSY
40.0000 mg | PREFILLED_SYRINGE | INTRAMUSCULAR | Status: DC
Start: 2022-01-21 — End: 2022-01-22
  Administered 2022-01-21: 40 mg via SUBCUTANEOUS
  Filled 2022-01-21: qty 0.4

## 2022-01-21 MED ORDER — AMLODIPINE BESYLATE 5 MG PO TABS
5.0000 mg | ORAL_TABLET | Freq: Every day | ORAL | Status: DC
  Administered 2022-01-21 – 2022-01-22 (×2): 5 mg via ORAL
  Filled 2022-01-21 (×2): qty 1

## 2022-01-21 NOTE — Progress Notes (Signed)
SATURATION QUALIFICATIONS: (This note is used to comply with regulatory documentation for home oxygen)  Patient Saturations on Room Air at Rest = 98%  Patient Saturations on Room Air while Ambulating = 94%  Patient Saturations on NA Liters of oxygen while Ambulating = NA%  Please briefly explain why patient needs home oxygen: 

## 2022-01-21 NOTE — Progress Notes (Signed)
Ok to resume Lovenox for DVT px per Dr Kirke Corin. ? ?Ulyses Southward, PharmD, BCIDP, AAHIVP, CPP ?Infectious Disease Pharmacist ?01/21/2022 11:29 AM ? ? ?

## 2022-01-21 NOTE — Progress Notes (Signed)
? ?Subjective:  ? ?Hospital day: 1  ? ?Overnight event: No acute events overnight. ? ?Patient was evaluated at the bedside with fianc? in the room. Patient states she slept okay. Breathing has improved compared to yesterday. Currently on room air. Patient reports she still has not moved her bowel.  ? ?On reassessment in the afternoon, patient states she has occasional dry cough but her breathing is okay. She continues to have some mild abdominal pain from the constipation.  She still has not moved her bowels. ? ?Objective: ? ?Vital signs in last 24 hours: ?Vitals:  ? 01/21/22 0611 01/21/22 0814 01/21/22 0857 01/21/22 1215  ?BP: 130/70 121/75  (!) 150/81  ?Pulse: 79 73  80  ?Resp: 19 20  20   ?Temp: 98.2 ?F (36.8 ?C) 98.9 ?F (37.2 ?C)  98.6 ?F (37 ?C)  ?TempSrc: Oral Oral  Oral  ?SpO2: 95% 93% 99% 94%  ?Weight: 89.7 kg     ?Height:      ? ? ?Filed Weights  ? 01/20/22 0641 01/21/22 0611  ?Weight: 88.5 kg 89.7 kg  ? ? ? ?Intake/Output Summary (Last 24 hours) at 01/21/2022 1553 ?Last data filed at 01/21/2022 0500 ?Gross per 24 hour  ?Intake 650 ml  ?Output --  ?Net 650 ml  ? ?Net IO Since Admission: 650 mL [01/21/22 1553] ? ?Recent Labs  ?  01/20/22 ?2234 01/21/22 ?03/23/22 01/21/22 ?1212  ?GLUCAP 271* 139* 249*  ?  ? ?Pertinent Labs: ?CBC Latest Ref Rng & Units 01/21/2022 01/20/2022 02/20/2020  ?WBC 4.0 - 10.5 K/uL 4.0 3.4(L) 9.3  ?Hemoglobin 12.0 - 15.0 g/dL 11.7(L) 12.3 13.0  ?Hematocrit 36.0 - 46.0 % 34.4(L) 37.4 38.2  ?Platelets 150 - 400 K/uL 148(L) 167 276  ? ? ?CMP Latest Ref Rng & Units 01/21/2022 01/20/2022 03/10/2021  ?Glucose 70 - 99 mg/dL 03/12/2021) 295(M) 91  ?BUN 8 - 23 mg/dL 11 11 14   ?Creatinine 0.44 - 1.00 mg/dL 841(L 2.44  ?Sodium 135 - 145 mmol/L 134(L) 135 127(L)  ?Potassium 3.5 - 5.1 mmol/L 4.7 4.4 5.1  ?Chloride 98 - 111 mmol/L 96(L) 99 87(L)  ?CO2 22 - 32 mmol/L 24 27 23   ?Calcium 8.9 - 10.3 mg/dL 0.10) 2.72) )  ?Total Protein 6.5 - 8.1 g/dL - 6.5 -  ?Total Bilirubin 0.3 - 1.2 mg/dL - 0.9 -  ?Alkaline Phos  38 - 126 U/L - 54 -  ?AST 15 - 41 U/L - 79(H) -  ?ALT 0 - 44 U/L - 36 -  ? ? ?Imaging: ?No results found. ? ?Physical Exam ? ?General: Pleasant, well-appearing elderly female laying in bed. No acute distress. ?CV: RRR. No murmurs, rubs, or gallops. No LE edema ?Pulmonary: On RA. Lungs CTAB. Normal effort. No wheezing or rales. ?Abdominal: Soft. Mildly distended. Mild diffuse tenderness.  Normal bowel sounds. ?Extremities: Radial and DP pulses 2+ and symmetric. Normal ROM. ?Skin: Warm and dry. No obvious rash or lesions. ?Neuro: A&Ox3. Moves all extremities. Normal sensation. No focal deficit. ?Psych: Normal mood and affect ? ? ?Assessment/Plan: ?Mary Gray is a 61 y.o. female with hx of hypertension, tobacco use disorder, hypothyroidism, T2DM, anxiety, depression, chronic pain, degenerative disc disease and spinal stenosis who presented to the ER for worsening cough. ? ?Principal Problem: ?  COVID-19 virus infection ? ?COVID-19 infection ?Respiratory status significantly improved. Patient currently on room air and satting above 94%. Wheezing has resolved. Patient able to maintain O2 sat 94% while ambulating. Normal inflammatory markers.  Patient reports some occasional dry  cough. ?--Continue dexamethasone 6 mg daily ?--Remdesivir for 5 days ?--PRN Albuterol nebulizer and inhaler ?--Pending discharge tomorrow morning ? ?Constipation ?Likely secondary to chronic opioid use. Patient continues to endorse mild abdominal pain. On exam, patient with mildly distended abdomen with mild diffuse tenderness. ?--Scheduled Colace 100 mg twice daily ?--Increase MiraLAX to twice daily ?--Start Senokot x1 dose ?--Consider mag citrate tomorrow morning if patient has not moved for bowel ? ?Hypertension ?Home regimen includes enalapril 20 mg daily and amlodipine 5 mg daily. BP soft on admission but elevated today with SBP in the 150s. We will start patient on home amlodipine continue to monitor. ?-- Start home amlodipine 5 mg  daily ?  ?Chronic back pain ?Patient with a history of degenerative disc disease and spinal stenosis.  Follows with pain specialist Dr. Jordan Likes.  ?--Continue home Norco 5-325 mg every 4 hours as needed for pain ?  ?Controlled T2DM ?Repeat A1c 6.0%.  ?--Continue metformin 1000 mg twice daily ?--SSI, and CBG checks ?  ?Hypothyroidism ?Last TSH 2.56 in 02/2021.  Repeat TSH 0.39. ?--Continue Synthroid 88 mcg daily ?  ?Tobacco use disorder ?Patient with significant tobacco use since the age of 9. Currently down to half a pack per day but has not smoked since she started feeling sick on February 28th. ?--Nicotine patch ?  ?Anxiety ?Continue hydroxyzine 25 mg twice daily as needed for anxiety ?  ?Diet: Regular ?IVF: None ?VTE: Lovenox ?CODE: Full ? ?Prior to Admission Living Arrangement: Home ?Anticipated Discharge Location: Home ?Barriers to Discharge: Improvement in abdominal pain/constipation ?Dispo: Anticipated discharge tomorrow morning ? ?Signed: ?Steffanie Rainwater, MD ?01/21/2022, 3:53 PM  ?Pager: [phone] ?Internal Medicine Teaching Service ?After 5pm on weekdays and 1pm on weekends: On Call pager: [phone] ? ?

## 2022-01-21 NOTE — Progress Notes (Signed)
?  Date: 01/21/2022 ? ?Patient name: Mary Gray  ?Medical record number: [id number]  ?Date of birth: October 15, 1961  ? ?. ?In brief, patient is a 61 year old female with a past medical history of hypertension, tobacco use disorder, hypothyroidism, type 2 diabetes, anxiety, depression, chronic pain, degenerative disc disease and spinal stenosis who presented to the ED with worsening cough. ? ?Patient states that she lives with her fianc? was recently diagnosed with COVID and she started to have myalgias, fevers of 102.9 ?F as well as a cough beginning February 28.  Patient's fevers have resolved but she developed chest tightness and persistent cough as well as associated nausea, headache and shortness of breath.  She came to ED for further evaluation. ? ?Today, patient states that she feels well and that her shortness of breath is improving.  She still complains of constipation. ? ?On exam, her lungs are clear to auscultation bilaterally, cardiovascular exam reveals regular rate and rhythm.  Abdomen is soft, nontender, nondistended, normoactive bowel sounds.  Patient with no peripheral edema and no tenderness to palpation of her extremities. ? ?Patient was admitted to the hospital progressive worsening shortness of breath in the setting of a positive COVID-19 infection.  She did have mild desaturation during O2 sats of 87% on room air in the ED and was admitted for further management.  We will continue with oral dexamethasone 6 mg daily as well as remdesivir to complete a 5-day course.  We will continue to monitor her laboratory markers.  Patient appears to be improving and her O2 sats remained in the 90s on room air today.  She should be stable for DC home tomorrow if she continues to improve.  No further work-up at this time. ? ? ?Earl Lagos, MD ?01/21/2022, 6:56 PM ?. ?

## 2022-01-22 DIAGNOSIS — J9601 Acute respiratory failure with hypoxia: Secondary | ICD-10-CM

## 2022-01-22 DIAGNOSIS — F411 Generalized anxiety disorder: Secondary | ICD-10-CM

## 2022-01-22 LAB — GLUCOSE, CAPILLARY
Glucose-Capillary: 159 mg/dL — ABNORMAL HIGH (ref 70–99)
Glucose-Capillary: 86 mg/dL (ref 70–99)

## 2022-01-22 MED ORDER — BISACODYL 5 MG PO TBEC
5.0000 mg | DELAYED_RELEASE_TABLET | Freq: Once | ORAL | Status: AC
  Administered 2022-01-22: 5 mg via ORAL
  Filled 2022-01-22: qty 1

## 2022-01-22 MED ORDER — SENNOSIDES-DOCUSATE SODIUM 8.6-50 MG PO TABS: 1.0000 | ORAL_TABLET | Freq: Once | ORAL | Status: DC

## 2022-01-22 NOTE — Discharge Summary (Signed)
Name: Mary Gray MRN: [id number] DOB: September 26, 1961 61 y.o. PCP: Pcp, No  Date of Admission: 01/20/2022  6:38 AM Date of Discharge:  01/22/2022 Attending Physician: Dr. Cleda DaubE. Hoffman  DISCHARGE DIAGNOSIS:  Primary Problem: COVID-19 virus infection   Hospital Problems: Principal Problem:   COVID-19 virus infection Active Problems:   Hypothyroidism   Generalized anxiety disorder   Acute respiratory failure with hypoxia (HCC)    DISCHARGE MEDICATIONS:   Allergies as of 01/22/2022       Reactions   Codeine Sulfate Other (See Comments)   Confusion, anxiety, fatigue   Meperidine And Related Nausea And Vomiting   Minocycline Hcl Nausea And Vomiting   Hospitalized for vomiting along with allergic reaction   Penicillins Other (See Comments)   Has patient had a PCN reaction causing immediate rash, facial/tongue/throat swelling, SOB or lightheadedness with hypotension: Yes Has patient had a PCN reaction causing severe rash involving mucus membranes or skin necrosis: Yes Has patient had a PCN reaction that required hospitalization Yes Has patient had a PCN reaction occurring within the last 10 years: Yes If all of the above answers are "NO", then may proceed with Cephalosporin use.   Propoxyphene N-acetaminophen Other (See Comments)   confused   Tetracycline Hcl Other (See Comments)   Vomiting Note: Patient took a course of doxycycline in June 2014 without any problems.        Medication List     TAKE these medications    Accu-Chek Guide test strip Generic drug: glucose blood CHECK BLOOD SUGAR 1 TIME PER DAY   Accu-Chek Softclix Lancets lancets Check blood sugar 1 time per day   acetaminophen 500 MG tablet Commonly known as: TYLENOL Take 1,000 mg by mouth every 6 (six) hours as needed for mild pain.   amLODipine 5 MG tablet Commonly known as: NORVASC TAKE 1 TABLET (5 MG TOTAL) BY MOUTH DAILY.   aspirin EC 81 MG tablet Take 81 mg by mouth daily. Swallow whole.   [facility]  VITAMIN B12 1000 MCG tablet Generic drug: cyanocobalamin TAKE 1 TABLET BY MOUTH EVERY DAY What changed: how much to take   enalapril 20 MG tablet Commonly known as: VASOTEC Take 1 tablet (20 mg total) by mouth daily.   esomeprazole 40 MG capsule Commonly known as: NEXIUM TAKE 1 CAPSULE BY MOUTH EVERY DAY What changed:  how much to take how to take this   furosemide 40 MG tablet Commonly known as: LASIX Take 40 mg by mouth daily.   HYDROcodone-acetaminophen 5-325 MG tablet Commonly known as: NORCO/VICODIN Take 1 tablet by mouth every 4 (four) hours as needed for pain.   hydrOXYzine 25 MG tablet Commonly known as: ATARAX TAKE 1 TABLET (25 MG TOTAL) BY MOUTH 2 (TWO) TIMES DAILY AS NEEDED FOR ANXIETY. FOR ANXIETY   ibuprofen 200 MG tablet Commonly known as: ADVIL Take 400 mg by mouth every 6 (six) hours as needed for fever, headache or mild pain.   levothyroxine 88 MCG tablet Commonly known as: SYNTHROID TAKE 1 TABLET BY MOUTH EVERY DAY BEFORE BREAKFAST What changed: See the new instructions.   Melatonin 10 MG Tabs Take 10 mg by mouth at bedtime.   metFORMIN 500 MG 24 hr tablet Commonly known as: GLUCOPHAGE-XR TAKE 2 TABLETS BY MOUTH TWICE A DAY What changed: how to take this   multivitamin-iron-minerals-folic acid chewable tablet Chew 1 tablet by mouth daily.   naloxegol oxalate 25 MG Tabs tablet Commonly known as: MOVANTIK Take 25 mg by mouth daily.  nystatin powder Commonly known as: nystatin Apply 1 application topically 3 (three) times daily.   Premarin vaginal cream Generic drug: conjugated estrogens PLACE 1 APPLICATORFUL VAGINALLY DAILY. What changed: See the new instructions.        DISPOSITION AND FOLLOW-UP:  Mary Gray was discharged from [facility] in Stable condition. At the hospital follow up visit please address:  COVID-19: Assess patient's symptoms since discharge.   HTN: Resumed home medications/no changes  made. BP was stable during admission  Follow-up Recommendations: Consults: none Labs:  none Studies: none Medications: no new medication changes  Follow-up Appointments:  Follow-up Information     [facility]. Schedule an appointment as soon as possible for a visit in 2 week(s).   Why: hospital follow up Contact information: [address] [place] [address] [phone]                HOSPITAL COURSE:  Patient Summary: COVID-19 infection Unvaccinated patient presented to the ER for upper respiratory symptoms found to be COVID-positive. Patient exposed to her fianc who was recently diagnosed with COVID. Symptoms started February 28th with cough, fever of 102.9, muscle aches and nausea.  Fever has not improved with OTC Tylenol patient continues to have ongoing cough shortness of breath.  Patient desatted in the ER to 87% on room air.  On initial exam, no increased work of breathing but there is mild expiratory wheezes on auscultation. Chest x-ray unremarkable. Patient on minimal oxygen requirements and was admitted for observation. She received IV solumedrol x 1 dose in the ED and was started on dexamethasone 6 mg daily and remdesivir. Her respiratory status significantly improved and she was saturating well on room air. Inflammatory markers were within normal limits. Remdesivir was discontinued on 3/5, as the patient continued to saturate well on room air and was overall improved from a respiratory standpoint. Dexamethasone also discontinued after she received a dose on 3/5. Patient to follow up with [facility] in 1-2 weeks after discharge for a hospital follow up appt.   Hypertension Patient's BP is currently stable with SBP in the 100s to 120s. Home regimen includes enalapril 20 mg daily and amlodipine 5 mg daily.  Antihypertensives initially held, however, will resume at discharge as BP has increased.   Chronic back pain Patient with a  history of degenerative disc disease and spinal stenosis.  Follows with pain specialist Dr. Jordan Likes. Continued home Norco 5-325 mg q4h prn.   Controlled T2DM A1c 6.0.  Patient currently on metformin 1000 mg twice daily.  Hypothyroidism TSH 0.390. Continue Synthroid 88 mcg daily  Tobacco use disorder Patient with significant tobacco use since the age of 47. Currently down to half a pack per day but has not smoked since she started feeling sick on February 28th. Provided nicotine patch.   Anxiety Resumed home hydroxyzine 25 mg twice daily as needed for anxiety  Constipation Secondary to chronic opioid use. Patient states she has not moved her bowels in the last 7 days prior to admission. Patient slightly tender to palpation diffusely in the abdomen with mildly distended abdomen. Scheduled Colace 100 mg bid and prn miralax. Patient had a bowel movement this morning which significantly improved her pain and she felt much better.    DISCHARGE INSTRUCTIONS:   Discharge Instructions     Call MD for:  difficulty breathing, headache or visual disturbances   Complete by: As directed    Call MD for:  persistant dizziness or  light-headedness   Complete by: As directed    Call MD for:  temperature >100.4   Complete by: As directed    Diet general   Complete by: As directed    Discharge instructions   Complete by: As directed    Dear Mary Gray,  You were hospitalized for a COVID-19 infection. You received Remdesivir and a steroid (dexamethasone) while you were admitted, and once your breathing improved, these medications were stopped. We are glad your breathing is doing much better! Your cough will likely linger, and you can use over the counter Robitussin if your cough is pretty dry, or you can try to use Mucinex if you are still coughing up some phlegm- as this will help loosen things.   The Internal medicine center will be calling you in the next day or two to set up a hospital follow up  appointment- please make an appointment to be seen in the next week or two. If you have any questions or concerns, call our clinic at [phone] or after hours call [phone] and ask for the internal medicine resident on call.   Increase activity slowly   Complete by: As directed        SUBJECTIVE:  Mary Gray was seen and evaluated on the day of discharge. She states that her breathing is much improved, although she does still have a cough. She also had a bowel movement this morning and notes that she is feeling a lot better. She has been trying to eat and drink, but notes that she has lost her sense of taste.   Discharge Vitals:   BP (!) 143/71 (BP Location: Right Arm)    Pulse 80    Temp 98.6 F (37 C) (Oral)    Resp 18    Ht 5\' 1"  (1.549 m)    Wt 89.7 kg    LMP 01/17/2009    SpO2 95%    BMI 37.37 kg/m   OBJECTIVE:  General: Pleasant, well-appearing female laying in bed. No acute distress. CV: RRR. No murmurs, rubs, or gallops. No LE edema Pulmonary: Lungs CTAB. Normal effort on room air.  Abdominal: Soft, nontender, nondistended. Normal bowel sounds. Extremities: Palpable radial and DP pulses. Normal ROM. Skin: Warm and dry. No obvious rash or lesions. Neuro: A&Ox3. No focal deficit. Psych: Normal mood and affect    Pertinent Labs, Studies, and Procedures:  CBC Latest Ref Rng & Units 01/21/2022 01/20/2022 02/20/2020  WBC 4.0 - 10.5 K/uL 4.0 3.4(L) 9.3  Hemoglobin 12.0 - 15.0 g/dL 11.7(L) 12.3 13.0  Hematocrit 36.0 - 46.0 % 34.4(L) 37.4 38.2  Platelets 150 - 400 K/uL 148(L) 167 276    CMP Latest Ref Rng & Units 01/21/2022 01/20/2022 03/10/2021  Glucose 70 - 99 mg/dL 03/12/2021) 967(R) 91  BUN 8 - 23 mg/dL 11 11 14   Creatinine 0.44 - 1.00 mg/dL 916(B 8.46  Sodium 135 - 145 mmol/L 134(L) 135 127(L)  Potassium 3.5 - 5.1 mmol/L 4.7 4.4 5.1  Chloride 98 - 111 mmol/L 96(L) 99 87(L)  CO2 22 - 32 mmol/L 24 27 23   Calcium 8.9 - 10.3 mg/dL 6.59) 9.35) )  Total Protein 6.5 -  8.1 g/dL - 6.5 -  Total Bilirubin 0.3 - 1.2 mg/dL - 0.9 -  Alkaline Phos 38 - 126 U/L - 54 -  AST 15 - 41 U/L - 79(H) -  ALT 0 - 44 U/L - 36 -    DG Chest 2 View  Result Date: 01/20/2022  CLINICAL DATA:  Chest pain, dyspnea, weakness EXAM: CHEST - 2 VIEW COMPARISON:  02/20/2020 chest radiograph. FINDINGS: Stable cardiomediastinal silhouette with normal heart size. No pneumothorax. No pleural effusion. Lungs appear clear, with no acute consolidative airspace disease and no pulmonary edema. IMPRESSION: No active cardiopulmonary disease. Electronically Signed   By: Delbert Phenix M.D.   On: 01/20/2022 08:04     Signed: Elza Rafter, D.O.  Internal Medicine Resident, PGY-1 [facility]  Pager: [phone] 11:49 AM, 01/22/2022

## 2022-01-22 NOTE — Hospital Course (Signed)
COVID-19 infection ?Unvaccinated patient presented to the ER for upper respiratory symptoms found to be COVID-positive. Patient exposed to her fianc? who was recently diagnosed with COVID. Symptoms started February 28th with cough, fever of 102.9, muscle aches and nausea.  Fever has not improved with OTC Tylenol patient continues to have ongoing cough shortness of breath.  Patient desatted in the ER to 87% on room air.  On initial exam, no increased work of breathing but there is mild expiratory wheezes on auscultation. Chest x-ray unremarkable. Patient on minimal oxygen requirements and was admitted for observation. She received IV solumedrol x 1 dose in the ED and was started on dexamethasone 6 mg daily and remdesivir. Her respiratory status significantly improved and she was saturating well on room air. Inflammatory markers were within normal limits. Remdesivir was discontinued on 3/5, as the patient continued to saturate well on room air and was overall improved from a respiratory standpoint. Dexamethasone also discontinued after she received a dose on 3/5. Patient to follow up with [facility] in 1-2 weeks after discharge for a hospital follow up appt.  ? ?Hypertension ?Patient's BP is currently stable with SBP in the 100s to 120s. Home regimen includes enalapril 20 mg daily and amlodipine 5 mg daily.  Antihypertensives initially held, however, will resume at discharge as BP has increased.  ? ?Chronic back pain ?Patient with a history of degenerative disc disease and spinal stenosis.  Follows with pain specialist Dr. Jordan Likes. Continued home Norco 5-325 mg q4h prn.  ? ?Controlled T2DM ?A1c 6.0.  Patient currently on metformin 1000 mg twice daily. ? ?Hypothyroidism ?TSH 0.390. Continue Synthroid 88 mcg daily ? ?Tobacco use disorder ?Patient with significant tobacco use since the age of 12. Currently down to half a pack per day but has not smoked since she started feeling sick on February 28th. Provided nicotine patch.   ? ?Anxiety ?Resumed home hydroxyzine 25 mg twice daily as needed for anxiety ? ?Constipation ?Secondary to chronic opioid use. Patient states she has not moved her bowels in the last 7 days prior to admission. Patient slightly tender to palpation diffusely in the abdomen with mildly distended abdomen. Scheduled Colace 100 mg bid and prn miralax. Patient had a bowel movement this morning which significantly improved her pain and she felt much better.  ?

## 2022-02-01 ENCOUNTER — Other Ambulatory Visit: Payer: Self-pay | Admitting: Student in an Organized Health Care Education/Training Program

## 2022-02-01 DIAGNOSIS — N952 Postmenopausal atrophic vaginitis: Secondary | ICD-10-CM

## 2022-02-09 ENCOUNTER — Other Ambulatory Visit: Payer: Self-pay | Admitting: Student in an Organized Health Care Education/Training Program

## 2022-02-09 DIAGNOSIS — K219 Gastro-esophageal reflux disease without esophagitis: Secondary | ICD-10-CM

## 2022-02-13 ENCOUNTER — Other Ambulatory Visit: Payer: Self-pay | Admitting: Student in an Organized Health Care Education/Training Program

## 2022-02-13 DIAGNOSIS — K219 Gastro-esophageal reflux disease without esophagitis: Secondary | ICD-10-CM

## 2022-02-15 DIAGNOSIS — M48062 Spinal stenosis, lumbar region with neurogenic claudication: Secondary | ICD-10-CM | POA: Diagnosis not present

## 2022-02-15 DIAGNOSIS — K5903 Drug induced constipation: Secondary | ICD-10-CM | POA: Diagnosis not present

## 2022-02-15 DIAGNOSIS — G894 Chronic pain syndrome: Secondary | ICD-10-CM | POA: Diagnosis not present

## 2022-02-15 DIAGNOSIS — M791 Myalgia, unspecified site: Secondary | ICD-10-CM | POA: Diagnosis not present

## 2022-02-15 DIAGNOSIS — Z79891 Long term (current) use of opiate analgesic: Secondary | ICD-10-CM | POA: Diagnosis not present

## 2022-03-03 ENCOUNTER — Other Ambulatory Visit: Payer: Self-pay | Admitting: Student in an Organized Health Care Education/Training Program

## 2022-03-03 DIAGNOSIS — I10 Essential (primary) hypertension: Secondary | ICD-10-CM

## 2022-03-15 DIAGNOSIS — Z79891 Long term (current) use of opiate analgesic: Secondary | ICD-10-CM | POA: Diagnosis not present

## 2022-03-15 DIAGNOSIS — G894 Chronic pain syndrome: Secondary | ICD-10-CM | POA: Diagnosis not present

## 2022-03-15 DIAGNOSIS — K5903 Drug induced constipation: Secondary | ICD-10-CM | POA: Diagnosis not present

## 2022-03-15 DIAGNOSIS — M791 Myalgia, unspecified site: Secondary | ICD-10-CM | POA: Diagnosis not present

## 2022-03-15 DIAGNOSIS — M48062 Spinal stenosis, lumbar region with neurogenic claudication: Secondary | ICD-10-CM | POA: Diagnosis not present

## 2022-03-25 ENCOUNTER — Other Ambulatory Visit: Payer: Self-pay | Admitting: Student in an Organized Health Care Education/Training Program

## 2022-03-25 DIAGNOSIS — I10 Essential (primary) hypertension: Secondary | ICD-10-CM

## 2022-04-02 ENCOUNTER — Other Ambulatory Visit: Payer: Self-pay | Admitting: Student in an Organized Health Care Education/Training Program

## 2022-04-02 DIAGNOSIS — E034 Atrophy of thyroid (acquired): Secondary | ICD-10-CM

## 2022-04-19 ENCOUNTER — Ambulatory Visit: Payer: Medicare HMO | Attending: Nurse Practitioner | Admitting: Nurse Practitioner

## 2022-04-19 ENCOUNTER — Encounter: Payer: Self-pay | Admitting: Nurse Practitioner

## 2022-04-19 VITALS — BP 117/73 | HR 86 | Ht 61.0 in | Wt 177.4 lb

## 2022-04-19 DIAGNOSIS — I10 Essential (primary) hypertension: Secondary | ICD-10-CM

## 2022-04-19 DIAGNOSIS — Z7689 Persons encountering health services in other specified circumstances: Secondary | ICD-10-CM | POA: Diagnosis not present

## 2022-04-19 DIAGNOSIS — Z1231 Encounter for screening mammogram for malignant neoplasm of breast: Secondary | ICD-10-CM

## 2022-04-19 DIAGNOSIS — M791 Myalgia, unspecified site: Secondary | ICD-10-CM | POA: Diagnosis not present

## 2022-04-19 DIAGNOSIS — E559 Vitamin D deficiency, unspecified: Secondary | ICD-10-CM | POA: Diagnosis not present

## 2022-04-19 DIAGNOSIS — E871 Hypo-osmolality and hyponatremia: Secondary | ICD-10-CM

## 2022-04-19 DIAGNOSIS — Z1211 Encounter for screening for malignant neoplasm of colon: Secondary | ICD-10-CM | POA: Diagnosis not present

## 2022-04-19 DIAGNOSIS — Z79891 Long term (current) use of opiate analgesic: Secondary | ICD-10-CM | POA: Diagnosis not present

## 2022-04-19 DIAGNOSIS — E1165 Type 2 diabetes mellitus with hyperglycemia: Secondary | ICD-10-CM | POA: Diagnosis not present

## 2022-04-19 DIAGNOSIS — K219 Gastro-esophageal reflux disease without esophagitis: Secondary | ICD-10-CM

## 2022-04-19 DIAGNOSIS — E538 Deficiency of other specified B group vitamins: Secondary | ICD-10-CM | POA: Diagnosis not present

## 2022-04-19 DIAGNOSIS — D72829 Elevated white blood cell count, unspecified: Secondary | ICD-10-CM

## 2022-04-19 DIAGNOSIS — E034 Atrophy of thyroid (acquired): Secondary | ICD-10-CM | POA: Diagnosis not present

## 2022-04-19 DIAGNOSIS — M48062 Spinal stenosis, lumbar region with neurogenic claudication: Secondary | ICD-10-CM | POA: Diagnosis not present

## 2022-04-19 DIAGNOSIS — K5903 Drug induced constipation: Secondary | ICD-10-CM | POA: Diagnosis not present

## 2022-04-19 DIAGNOSIS — F419 Anxiety disorder, unspecified: Secondary | ICD-10-CM

## 2022-04-19 DIAGNOSIS — G894 Chronic pain syndrome: Secondary | ICD-10-CM | POA: Diagnosis not present

## 2022-04-19 DIAGNOSIS — F418 Other specified anxiety disorders: Secondary | ICD-10-CM

## 2022-04-19 DIAGNOSIS — E785 Hyperlipidemia, unspecified: Secondary | ICD-10-CM | POA: Diagnosis not present

## 2022-04-19 DIAGNOSIS — F5105 Insomnia due to other mental disorder: Secondary | ICD-10-CM

## 2022-04-19 DIAGNOSIS — F32A Depression, unspecified: Secondary | ICD-10-CM

## 2022-04-19 MED ORDER — ESOMEPRAZOLE MAGNESIUM 40 MG PO CPDR: 40.0000 mg | DELAYED_RELEASE_CAPSULE | Freq: Every day | ORAL | 1 refills | Status: DC

## 2022-04-19 MED ORDER — PRAVASTATIN SODIUM 10 MG PO TABS
10.0000 mg | ORAL_TABLET | Freq: Every day | ORAL | 1 refills | Status: DC
Start: 2022-04-19 — End: 2022-04-20

## 2022-04-19 MED ORDER — HYDROXYZINE HCL 25 MG PO TABS: 25.0000 mg | ORAL_TABLET | Freq: Three times a day (TID) | ORAL | 1 refills | Status: DC

## 2022-04-19 MED ORDER — AMITRIPTYLINE HCL 25 MG PO TABS: 25.0000 mg | ORAL_TABLET | Freq: Every day | ORAL | 1 refills | Status: DC

## 2022-04-19 MED ORDER — FUROSEMIDE 40 MG PO TABS: 40.0000 mg | ORAL_TABLET | Freq: Every day | ORAL | 1 refills | Status: DC

## 2022-04-19 MED ORDER — METFORMIN HCL ER 500 MG PO TB24: 1000.0000 mg | ORAL_TABLET | Freq: Two times a day (BID) | ORAL | 1 refills | Status: DC

## 2022-04-19 MED ORDER — AMLODIPINE BESYLATE 5 MG PO TABS: 5.0000 mg | ORAL_TABLET | Freq: Every day | ORAL | 1 refills | Status: DC

## 2022-04-19 MED ORDER — ENALAPRIL MALEATE 20 MG PO TABS: 20.0000 mg | ORAL_TABLET | Freq: Every day | ORAL | 1 refills | Status: DC

## 2022-04-19 MED ORDER — ZOLPIDEM TARTRATE 5 MG PO TABS: 5.0000 mg | ORAL_TABLET | Freq: Every evening | ORAL | 1 refills | Status: DC | PRN

## 2022-04-19 NOTE — Progress Notes (Signed)
Assessment & Plan:  Mary Gray was seen today for new patient (initial visit).  Diagnoses and all orders for this visit:  Encounter to establish care  Breast cancer screening by mammogram -     MM 3D SCREEN BREAST BILATERAL; Future  Colon cancer screening -     Ambulatory referral to Gastroenterology  Hyponatremia -     CMP14+EGFR  Essential hypertension -     CMP14+EGFR -     amLODipine (NORVASC) 5 MG tablet; Take 1 tablet (5 mg total) by mouth daily. -     enalapril (VASOTEC) 20 MG tablet; Take 1 tablet (20 mg total) by mouth daily. -     furosemide (LASIX) 40 MG tablet; Take 1 tablet (40 mg total) by mouth daily.  Hypothyroidism due to acquired atrophy of thyroid -     Thyroid Panel With TSH  Leukocytosis, unspecified type -     CBC with Differential  Uncontrolled type 2 diabetes mellitus with hyperglycemia ([place]) -     Ambulatory referral to Ophthalmology -     metFORMIN (GLUCOPHAGE-XR) 500 MG 24 hr tablet; Take 2 tablets (1,000 mg total) by mouth 2 (two) times daily. -     Hemoglobin A1c  Dyslipidemia, goal LDL below 70 -     Lipid panel -     pravastatin (PRAVACHOL) 10 MG tablet; Take 1 tablet (10 mg total) by mouth daily.  Anxiety and depression -     hydrOXYzine (ATARAX) 25 MG tablet; Take 1 tablet (25 mg total) by mouth 3 (three) times daily. for anxiety -     amitriptyline (ELAVIL) 25 MG tablet; Take 1 tablet (25 mg total) by mouth at bedtime.  Gastroesophageal reflux disease, unspecified whether esophagitis present -     esomeprazole (NEXIUM) 40 MG capsule; Take 1 capsule (40 mg total) by mouth daily.  B12 deficiency -     Vitamin B12  Vitamin D deficiency disease -     VITAMIN D 25 Hydroxy (Vit-D Deficiency, Fractures)  Insomnia secondary to depression with anxiety -     zolpidem (AMBIEN) 5 MG tablet; Take 1 tablet (5 mg total) by mouth at bedtime as needed for sleep.    Patient has been counseled on age-appropriate routine health concerns for  screening and prevention. These are reviewed and up-to-date. Referrals have been placed accordingly. Immunizations are up-to-date or declined.    Subjective:   Chief Complaint  Patient presents with   New Patient (Initial Visit)   HPI Mary Gray 61 y.o. female presents to office today to establish care. She has a past medical history of Back pain, Chronic interstitial cystitis, COPD, DDD lumbar, Degeneration of cervical intervertebral disc, Depression, Diabetes mellitus, type 2 (2007), Heme positive stool, Hypertension, Hypothyroidism, Iron deficiency anemia, and Spinal stenosis Tobacco Dependence   Patient has been counseled on age-appropriate routine health concerns for screening and prevention. These are reviewed and up-to-date. Referrals have been placed accordingly. Immunizations are up-to-date or declined.   MAMMOGRAM: OVER DUE. Referral placed PAP Smear: OVERDUE. To be scheduled Colonoscopy: OVERDUE. Referral placed.    Depression and Anxiety with insomnia Medications tried in the past: zoloft, celexa, Klonopin, trazodone, elavil, xanax, ambien,  belsomra, hydroxyzine, duloxetine, Abilify( she can not recall why she was prescribed this) Currently taking hydroxyzine 25 mg BID for anxiety (does not feel it is very effective). She is requesting a sleep aid for insomnia. She has difficulty falling asleep and staying asleep. We have also started elavil today as well for  depression.     04/19/2022    2:03 PM 06/22/2021   10:45 AM 04/08/2021   10:34 AM  Depression screen PHQ 2/9  Decreased Interest 2 3 1   Down, Depressed, Hopeless 2 3 2   PHQ - 2 Score 4 6 3   Altered sleeping 2 3 2   Tired, decreased energy 2 0 2  Change in appetite 2 3 2   Feeling bad or failure about yourself  0 0 0  Trouble concentrating 1 3 1   Moving slowly or fidgety/restless 2 1 1   Suicidal thoughts 0 0 0  PHQ-9 Score 13 16 11   Difficult doing work/chores  Very difficult Somewhat difficult       04/19/2022     2:03 PM 06/22/2021    2:07 PM 04/08/2021   10:36 AM  GAD 7 : Generalized Anxiety Score  Nervous, Anxious, on Edge 3 0 2  Control/stop worrying 3 1 2   Worry too much - different things 3 3 2   Trouble relaxing 3 1 2   Restless 2 0 2  Easily annoyed or irritable 2 0 2  Afraid - awful might happen 2 0 2  Total GAD 7 Score 18 5 14   Anxiety Difficulty  Somewhat difficult Somewhat difficult    HTN Blood pressure is well controlled with amlodipine 5 mg daily, enalapril 20 mg daily and lasix 40 mg daily for BLE edema.  BP Readings from Last 3 Encounters:  04/19/22 117/73  01/22/22 (!) 143/71  06/22/21 130/76     Chronic back pain She has chronic back pain with a history of degenerative disc disease and spinal stenosis.  Follows with pain specialist Dr. Vira Blanco. Continued home Norco 5-325 mg q4h prn.  Tobacco use disorder Patient with significant tobacco use since the age of 46. Currently down to half a pack per day but has not smoked since she started feeling sick on February 28th.      DM 2 Well controlled with glucophage XR 1000 mg BID. LDL not at goal however she has been out of her pravastatin 10 mg . Lab Results  Component Value Date   HGBA1C 6.0 (H) 01/20/2022   Lab Results  Component Value Date   LDLCALC 106 (H) 03/18/2021     Hypothyroidism Thyroid level well controlled. She denies any symptoms of hypo or hyperthyroidism.   Lab Results  Component Value Date   TSH 0.390 01/20/2022     Review of Systems  Constitutional:  Negative for fever, malaise/fatigue and weight loss.  HENT: Negative.  Negative for nosebleeds.   Eyes: Negative.  Negative for blurred vision, double vision and photophobia.  Respiratory: Negative.  Negative for cough and shortness of breath.   Cardiovascular: Negative.  Negative for chest pain, palpitations and leg swelling.  Gastrointestinal: Negative.  Negative for heartburn, nausea and vomiting.  Musculoskeletal:  Positive for back pain.  Negative for myalgias.  Neurological: Negative.  Negative for dizziness, focal weakness, seizures and headaches.  Psychiatric/Behavioral:  Positive for depression. Negative for suicidal ideas. The patient is nervous/anxious and has insomnia.    Past Medical History:  Diagnosis Date   Back pain    Chronic interstitial cystitis    Followed by Dr. Franchot Gallo   COPD (chronic obstructive pulmonary disease) ([place])    DDD (degenerative disc disease), lumbar    Degeneration of cervical intervertebral disc    Depression    Diabetes mellitus, type 2 ([place]) 2007   Heme positive stool    EGD on 04/08/2010 by Dr. Watt Climes  showed chronic gastritis and a few gastric polyps; pathology showed a fundic gland polyp.  Colonoscopy on 04/08/2010 showed small external and internal hemorrhoids, and a few benign-appearing diminutive polyps in the rectum, the distal sigmoid colon, and in the distal descending colon; pathology showed hyperplastic polyps.    Hypertension    Hypothyroidism    Iron deficiency anemia    Spinal stenosis     Past Surgical History:  Procedure Laterality Date   CARPAL TUNNEL RELEASE  05/08/2000   By Dr. Youlanda Mighty. Sypher, Brooke Bonito.    Family History  Problem Relation Age of Onset   Heart attack Father 58   Diabetes Mother    Hypertension Mother    Diabetes Sister    Breast cancer Neg Hx    Colon cancer Neg Hx     Social History Reviewed with no changes to be made today.   Outpatient Medications Prior to Visit  Medication Sig Dispense Refill   Accu-Chek Softclix Lancets lancets Check blood sugar 1 time per day 100 each 3   acetaminophen (TYLENOL) 500 MG tablet Take 1,000 mg by mouth every 6 (six) hours as needed for mild pain.     aspirin EC 81 MG tablet Take 81 mg by mouth daily. Swallow whole.     [facility] VITAMIN B12 1000 MCG tablet TAKE 1 TABLET BY MOUTH EVERY DAY (Patient taking differently: Take 1,000 mcg by mouth daily.) 90 tablet 3   glucose blood (ACCU-CHEK GUIDE) test strip  CHECK BLOOD SUGAR 1 TIME PER DAY 100 strip 3   HYDROcodone-acetaminophen (NORCO/VICODIN) 5-325 MG tablet Take 1 tablet by mouth every 4 (four) hours as needed for pain.     ibuprofen (ADVIL) 200 MG tablet Take 400 mg by mouth every 6 (six) hours as needed for fever, headache or mild pain.     levothyroxine (SYNTHROID) 88 MCG tablet TAKE 1 TABLET BY MOUTH EVERY DAY BEFORE BREAKFAST (Patient taking differently: Take 88 mcg by mouth daily before breakfast.) 30 tablet 11   multivitamin-iron-minerals-folic acid (CENTRUM) chewable tablet Chew 1 tablet by mouth daily. 90 tablet 3   naloxegol oxalate (MOVANTIK) 25 MG TABS tablet Take 25 mg by mouth daily.     nystatin (NYSTATIN) powder Apply 1 application topically 3 (three) times daily. 15 g 0   PREMARIN vaginal cream PLACE 1 APPLICATORFUL VAGINALLY DAILY. (Patient taking differently: 1 applicator 2 (two) times a week.) 30 g 1   enalapril (VASOTEC) 20 MG tablet Take 1 tablet (20 mg total) by mouth daily. 30 tablet 1   esomeprazole (NEXIUM) 40 MG capsule TAKE 1 CAPSULE BY MOUTH EVERY DAY (Patient taking differently: 40 mg daily.) 90 capsule 1   furosemide (LASIX) 40 MG tablet Take 40 mg by mouth daily.     hydrOXYzine (ATARAX/VISTARIL) 25 MG tablet TAKE 1 TABLET (25 MG TOTAL) BY MOUTH 2 (TWO) TIMES DAILY AS NEEDED FOR ANXIETY. FOR ANXIETY 180 tablet 1   Melatonin 10 MG TABS Take 10 mg by mouth at bedtime.     metFORMIN (GLUCOPHAGE-XR) 500 MG 24 hr tablet TAKE 2 TABLETS BY MOUTH TWICE A DAY (Patient taking differently: 1,000 mg 2 (two) times daily.) 360 tablet 1   amLODipine (NORVASC) 5 MG tablet TAKE 1 TABLET (5 MG TOTAL) BY MOUTH DAILY. 90 tablet 3   No facility-administered medications prior to visit.    Allergies  Allergen Reactions   Codeine Sulfate Other (See Comments)    Confusion, anxiety, fatigue   Meperidine And Related Nausea And Vomiting  Minocycline Hcl Nausea And Vomiting    Hospitalized for vomiting along with allergic reaction    Penicillins Other (See Comments)    Has patient had a PCN reaction causing immediate rash, facial/tongue/throat swelling, SOB or lightheadedness with hypotension: Yes Has patient had a PCN reaction causing severe rash involving mucus membranes or skin necrosis: Yes Has patient had a PCN reaction that required hospitalization Yes Has patient had a PCN reaction occurring within the last 10 years: Yes If all of the above answers are "NO", then may proceed with Cephalosporin use.    Propoxyphene N-Acetaminophen Other (See Comments)    confused   Tetracycline Hcl Other (See Comments)    Vomiting Note: Patient took a course of doxycycline in June 2014 without any problems.       Objective:    BP 117/73   Pulse 86   Ht 5' 1"  (1.549 m)   Wt 177 lb 6.4 oz (80.5 kg)   LMP 01/17/2009   SpO2 99%   BMI 33.52 kg/m  Wt Readings from Last 3 Encounters:  04/19/22 177 lb 6.4 oz (80.5 kg)  01/21/22 197 lb 12 oz (89.7 kg)  06/22/21 195 lb (88.5 kg)    Physical Exam       Patient has been counseled extensively about nutrition and exercise as well as the importance of adherence with medications and regular follow-up. The patient was given clear instructions to go to ER or return to medical center if symptoms don't improve, worsen or new problems develop. The patient verbalized understanding.   Follow-up: Return in about 3 weeks (around 05/10/2022) for virtual tuesday depression. Then see me in 3 months.   Gildardo Pounds, FNP-BC [facility] and [place], [place]   04/19/2022, 10:51 PM

## 2022-04-19 NOTE — Progress Notes (Signed)
Trouble sleeping

## 2022-04-20 ENCOUNTER — Other Ambulatory Visit: Payer: Self-pay | Admitting: Nurse Practitioner

## 2022-04-20 LAB — HEMOGLOBIN A1C
Est. average glucose Bld gHb Est-mCnc: 131 mg/dL
Hgb A1c MFr Bld: 6.2 % — ABNORMAL HIGH (ref 4.8–5.6)

## 2022-04-20 LAB — LIPID PANEL
Chol/HDL Ratio: 8 ratio — ABNORMAL HIGH (ref 0.0–4.4)
Cholesterol, Total: 287 mg/dL — ABNORMAL HIGH (ref 100–199)
HDL: 36 mg/dL — ABNORMAL LOW (ref >39)
LDL Chol Calc (NIH): 138 mg/dL — ABNORMAL HIGH (ref 0–99)
Triglycerides: 600 mg/dL (ref 0–149)
VLDL Cholesterol Cal: 113 mg/dL — ABNORMAL HIGH (ref 5–40)

## 2022-04-20 LAB — CBC WITH DIFFERENTIAL/PLATELET
Basophils Absolute: 0.1 10*3/uL (ref 0.0–0.2)
Basos: 1 %
EOS (ABSOLUTE): 0.4 10*3/uL (ref 0.0–0.4)
Eos: 4 %
Hematocrit: 38.2 % (ref 34.0–46.6)
Hemoglobin: 13.1 g/dL (ref 11.1–15.9)
Immature Grans (Abs): 0 10*3/uL (ref 0.0–0.1)
Immature Granulocytes: 0 %
Lymphocytes Absolute: 2.6 10*3/uL (ref 0.7–3.1)
Lymphs: 28 %
MCH: 30 pg (ref 26.6–33.0)
MCHC: 34.3 g/dL (ref 31.5–35.7)
MCV: 87 fL (ref 79–97)
Monocytes Absolute: 0.6 10*3/uL (ref 0.1–0.9)
Monocytes: 7 %
Neutrophils Absolute: 5.7 10*3/uL (ref 1.4–7.0)
Neutrophils: 60 %
Platelets: 252 10*3/uL (ref 150–450)
RBC: 4.37 x10E6/uL (ref 3.77–5.28)
RDW: 13 % (ref 11.7–15.4)
WBC: 9.5 10*3/uL (ref 3.4–10.8)

## 2022-04-20 LAB — CMP14+EGFR
ALT: 17 IU/L (ref 0–32)
AST: 25 IU/L (ref 0–40)
Albumin/Globulin Ratio: 1.8 (ref 1.2–2.2)
Albumin: 4.4 g/dL (ref 3.8–4.8)
Alkaline Phosphatase: 70 IU/L (ref 44–121)
BUN/Creatinine Ratio: 13 (ref 12–28)
BUN: 10 mg/dL (ref 8–27)
Bilirubin Total: 0.2 mg/dL (ref 0.0–1.2)
CO2: 21 mmol/L (ref 20–29)
Calcium: 9.4 mg/dL (ref 8.7–10.3)
Chloride: 97 mmol/L (ref 96–106)
Creatinine, Ser: 0.79 mg/dL (ref 0.57–1.00)
Globulin, Total: 2.4 g/dL (ref 1.5–4.5)
Glucose: 172 mg/dL — ABNORMAL HIGH (ref 70–99)
Potassium: 4 mmol/L (ref 3.5–5.2)
Sodium: 138 mmol/L (ref 134–144)
Total Protein: 6.8 g/dL (ref 6.0–8.5)
eGFR: 85 mL/min/{1.73_m2} (ref >59)

## 2022-04-20 LAB — VITAMIN D 25 HYDROXY (VIT D DEFICIENCY, FRACTURES): Vit D, 25-Hydroxy: 18.9 ng/mL — ABNORMAL LOW (ref 30.0–100.0)

## 2022-04-20 LAB — THYROID PANEL WITH TSH
Free Thyroxine Index: 1.8 (ref 1.2–4.9)
T3 Uptake Ratio: 20 % — ABNORMAL LOW (ref 24–39)
T4, Total: 8.8 ug/dL (ref 4.5–12.0)
TSH: 2.01 u[IU]/mL (ref 0.450–4.500)

## 2022-04-20 LAB — VITAMIN B12: Vitamin B-12: 637 pg/mL (ref 232–1245)

## 2022-04-20 MED ORDER — VITAMIN D (ERGOCALCIFEROL) 1.25 MG (50000 UNIT) PO CAPS: 50000.0000 [IU] | ORAL_CAPSULE | ORAL | 1 refills | Status: DC

## 2022-04-20 MED ORDER — ROSUVASTATIN CALCIUM 20 MG PO TABS: 20.0000 mg | ORAL_TABLET | Freq: Every day | ORAL | 3 refills | Status: DC

## 2022-05-10 ENCOUNTER — Other Ambulatory Visit: Payer: Medicare HMO

## 2022-05-13 ENCOUNTER — Other Ambulatory Visit: Payer: Self-pay | Admitting: Nurse Practitioner

## 2022-05-13 DIAGNOSIS — F419 Anxiety disorder, unspecified: Secondary | ICD-10-CM

## 2022-05-13 DIAGNOSIS — F32A Depression, unspecified: Secondary | ICD-10-CM

## 2022-05-13 DIAGNOSIS — I10 Essential (primary) hypertension: Secondary | ICD-10-CM

## 2022-05-16 ENCOUNTER — Ambulatory Visit: Payer: Medicare HMO | Attending: Nurse Practitioner | Admitting: Nurse Practitioner

## 2022-05-16 ENCOUNTER — Encounter: Payer: Self-pay | Admitting: Nurse Practitioner

## 2022-05-16 DIAGNOSIS — J40 Bronchitis, not specified as acute or chronic: Secondary | ICD-10-CM | POA: Diagnosis not present

## 2022-05-16 DIAGNOSIS — F17200 Nicotine dependence, unspecified, uncomplicated: Secondary | ICD-10-CM

## 2022-05-16 MED ORDER — AZITHROMYCIN 250 MG PO TABS: ORAL_TABLET | ORAL | 0 refills | Status: AC

## 2022-05-16 MED ORDER — ALBUTEROL SULFATE HFA 108 (90 BASE) MCG/ACT IN AERS: 2.0000 | INHALATION_SPRAY | Freq: Four times a day (QID) | RESPIRATORY_TRACT | 0 refills | Status: DC | PRN

## 2022-05-16 MED ORDER — PROMETHAZINE-DM 6.25-15 MG/5ML PO SYRP: 5.0000 mL | ORAL_SOLUTION | Freq: Four times a day (QID) | ORAL | 0 refills | Status: DC | PRN

## 2022-05-17 DIAGNOSIS — M48062 Spinal stenosis, lumbar region with neurogenic claudication: Secondary | ICD-10-CM | POA: Diagnosis not present

## 2022-05-17 DIAGNOSIS — Z79891 Long term (current) use of opiate analgesic: Secondary | ICD-10-CM | POA: Diagnosis not present

## 2022-05-17 DIAGNOSIS — M791 Myalgia, unspecified site: Secondary | ICD-10-CM | POA: Diagnosis not present

## 2022-05-17 DIAGNOSIS — G894 Chronic pain syndrome: Secondary | ICD-10-CM | POA: Diagnosis not present

## 2022-05-17 DIAGNOSIS — K5903 Drug induced constipation: Secondary | ICD-10-CM | POA: Diagnosis not present

## 2022-05-22 ENCOUNTER — Encounter: Payer: Self-pay | Admitting: Nurse Practitioner

## 2022-06-07 ENCOUNTER — Other Ambulatory Visit: Payer: Self-pay | Admitting: Family Medicine

## 2022-06-07 DIAGNOSIS — F419 Anxiety disorder, unspecified: Secondary | ICD-10-CM

## 2022-06-12 ENCOUNTER — Other Ambulatory Visit: Payer: Self-pay | Admitting: Nurse Practitioner

## 2022-06-12 DIAGNOSIS — J40 Bronchitis, not specified as acute or chronic: Secondary | ICD-10-CM

## 2022-06-12 DIAGNOSIS — F17200 Nicotine dependence, unspecified, uncomplicated: Secondary | ICD-10-CM

## 2022-06-13 NOTE — Telephone Encounter (Signed)
Requested Prescriptions  Pending Prescriptions Disp Refills  . albuterol (VENTOLIN HFA) 108 (90 Base) MCG/ACT inhaler [Pharmacy Med Name: ALBUTEROL HFA (VENTOLIN) INH] 18 each 0    Sig: TAKE 2 PUFFS BY MOUTH EVERY 6 HOURS AS NEEDED FOR WHEEZE OR SHORTNESS OF BREATH     Pulmonology:  Beta Agonists 2 Passed - 06/12/2022  2:18 AM      Passed - Last BP in normal range    BP Readings from Last 1 Encounters:  04/19/22 117/73         Passed - Last Heart Rate in normal range    Pulse Readings from Last 1 Encounters:  04/19/22 86         Passed - Valid encounter within last 12 months    Recent Outpatient Visits          4 weeks ago Bronchitis   [facility] [place], Shea Stakes, NP   1 month ago Encounter to establish care   [facility] [place], Shea Stakes, NP      Future Appointments            In 1 month Claiborne Rigg, NP [facility]

## 2022-06-17 ENCOUNTER — Other Ambulatory Visit: Payer: Self-pay | Admitting: Nurse Practitioner

## 2022-06-17 DIAGNOSIS — I10 Essential (primary) hypertension: Secondary | ICD-10-CM

## 2022-06-19 NOTE — Telephone Encounter (Signed)
Requested Prescriptions  Pending Prescriptions Disp Refills  . enalapril (VASOTEC) 20 MG tablet [Pharmacy Med Name: ENALAPRIL MALEATE 20 MG TAB] 90 tablet 0    Sig: TAKE 1 TABLET BY MOUTH EVERY DAY     Cardiovascular:  ACE Inhibitors Passed - 06/17/2022  1:31 PM      Passed - Cr in normal range and within 180 days    Creat  Date Value Ref Range Status  02/09/2015 0.55 0.50 - 1.10 mg/dL Final   Creatinine, Ser  Date Value Ref Range Status  04/19/2022 0.79 0.57 - 1.00 mg/dL Final   Creatinine,U  Date Value Ref Range Status  04/29/2010 71.9 mg/dL Final   Creatinine, Urine  Date Value Ref Range Status  03/22/2015 27.33 >20.0 mg/dL Final    Comment:    * (PPS) Presumptive positive screen result to be verified by         quantitative LC/MS or GC/MS confirmation testing.          Passed - K in normal range and within 180 days    Potassium  Date Value Ref Range Status  04/19/2022 4.0 3.5 - 5.2 mmol/L Final         Passed - Patient is not pregnant      Passed - Last BP in normal range    BP Readings from Last 1 Encounters:  04/19/22 117/73         Passed - Valid encounter within last 6 months    Recent Outpatient Visits          1 month ago Bronchitis   [facility] [place], Shea Stakes, NP   2 months ago Encounter to establish care   [facility] [place], Shea Stakes, NP      Future Appointments            In 1 month Claiborne Rigg, NP [facility]

## 2022-06-21 ENCOUNTER — Other Ambulatory Visit: Payer: Self-pay | Admitting: Nurse Practitioner

## 2022-06-21 DIAGNOSIS — F418 Other specified anxiety disorders: Secondary | ICD-10-CM

## 2022-06-22 NOTE — Telephone Encounter (Signed)
Requested medication (s) are due for refill today: yes  Requested medication (s) are on the active medication list: yes  Last refill:  04/19/22 #30  Future visit scheduled: yes  Notes to clinic:  med not delegated to NT to RF   Requested Prescriptions  Pending Prescriptions Disp Refills   zolpidem (AMBIEN) 5 MG tablet [Pharmacy Med Name: ZOLPIDEM TARTRATE 5 MG TABLET] 30 tablet 1    Sig: TAKE 1 TABLET BY MOUTH AT BEDTIME AS NEEDED FOR SLEEP.     Not Delegated - Psychiatry:  Anxiolytics/Hypnotics Failed - 06/21/2022  5:58 PM      Failed - This refill cannot be delegated      Failed - Urine Drug Screen completed in last 360 days      Passed - Valid encounter within last 6 months    Recent Outpatient Visits           1 month ago Bronchitis   [facility] [place], Shea Stakes, NP   2 months ago Encounter to establish care   [facility] [place], Shea Stakes, NP       Future Appointments             In 4 weeks Claiborne Rigg, NP [employer] [facility]

## 2022-06-27 ENCOUNTER — Telehealth: Payer: Self-pay

## 2022-06-27 NOTE — Telephone Encounter (Signed)
Contacted pt to see if I can start her Medicare Wellness visit pt didn't answer lvm

## 2022-06-30 ENCOUNTER — Other Ambulatory Visit: Payer: Self-pay | Admitting: Nurse Practitioner

## 2022-06-30 DIAGNOSIS — E034 Atrophy of thyroid (acquired): Secondary | ICD-10-CM

## 2022-06-30 MED ORDER — LEVOTHYROXINE SODIUM 88 MCG PO TABS: 88.0000 ug | ORAL_TABLET | Freq: Every day | ORAL | 2 refills | Status: DC

## 2022-06-30 NOTE — Telephone Encounter (Signed)
Medication Refill - Medication: levothyroxine (SYNTHROID) 88 MCG tablet [[id number]]   Has the patient contacted their pharmacy? Yes.   (Agent: If no, request that the patient contact the pharmacy for the refill. If patient does not wish to contact the pharmacy document the reason why and proceed with request.) (Agent: If yes, when and what did the pharmacy advise?)  Preferred Pharmacy (with phone number or street name):  [id number] [place], [place] - [address]  [address] [place] [address]  Phone: [phone] Fax: [phone]  Hours: Not open 24 hours   Has the patient been seen for an appointment in the last year OR does the patient have an upcoming appointment? Yes.    Agent: Please be advised that RX refills may take up to 3 business days. We ask that you follow-up with your pharmacy.

## 2022-06-30 NOTE — Telephone Encounter (Signed)
Requested Prescriptions  Pending Prescriptions Disp Refills  . levothyroxine (SYNTHROID) 88 MCG tablet 30 tablet 11     Endocrinology:  Hypothyroid Agents Passed - 06/30/2022  5:10 PM      Passed - TSH in normal range and within 360 days    TSH  Date Value Ref Range Status  04/19/2022 2.010 0.450 - 4.500 uIU/mL Final         Passed - Valid encounter within last 12 months    Recent Outpatient Visits          1 month ago Bronchitis   [facility] [place], Shea Stakes, NP   2 months ago Encounter to establish care   [facility] [place], Shea Stakes, NP      Future Appointments            In 3 weeks Claiborne Rigg, NP [employer] [facility]

## 2022-07-06 ENCOUNTER — Other Ambulatory Visit: Payer: Self-pay | Admitting: Nurse Practitioner

## 2022-07-06 DIAGNOSIS — F17200 Nicotine dependence, unspecified, uncomplicated: Secondary | ICD-10-CM

## 2022-07-06 DIAGNOSIS — J40 Bronchitis, not specified as acute or chronic: Secondary | ICD-10-CM

## 2022-07-14 ENCOUNTER — Other Ambulatory Visit: Payer: Self-pay | Admitting: Nurse Practitioner

## 2022-07-14 NOTE — Telephone Encounter (Signed)
Medication Refill - Medication:   Movantik 25 mg,   Omeprazole 40 mg  Pt called saying the pharmacy told her she needed to call the office to get her Levothyroxine  Has the patient contacted their pharmacy? Yes.    Preferred Pharmacy (with phone number [facility] [address]  Has the patient been seen for an appointment in the last year OR does the patient have an upcoming appointment? Yes.    Agent: Please be advised that RX refills may take up to 3 business days. We ask that you follow-up with your pharmacy.

## 2022-07-14 NOTE — Telephone Encounter (Signed)
Requested medication (s) are due for refill today: yes  Requested medication (s) are on the active medication list: yes  Last refill:  01/20/22  Future visit scheduled: yes  Notes to clinic:  historical med by another provider. Please advise      Requested Prescriptions  Pending Prescriptions Disp Refills   naloxegol oxalate (MOVANTIK) 25 MG TABS tablet 30 tablet     Sig: Take 1 tablet (25 mg total) by mouth daily.     Gastroenterology: Opioid-Induced Constipation - naloxegol Passed - 07/14/2022 11:20 AM      Passed - Cr in normal range and within 360 days    Creat  Date Value Ref Range Status  02/09/2015 0.55 0.50 - 1.10 mg/dL Final   Creatinine, Ser  Date Value Ref Range Status  04/19/2022 0.79 0.57 - 1.00 mg/dL Final   Creatinine,U  Date Value Ref Range Status  04/29/2010 71.9 mg/dL Final   Creatinine, Urine  Date Value Ref Range Status  03/22/2015 27.33 >20.0 mg/dL Final    Comment:    * (PPS) Presumptive positive screen result to be verified by         quantitative LC/MS or GC/MS confirmation testing.          Passed - Valid encounter within last 12 months    Recent Outpatient Visits           1 month ago Bronchitis   [facility] [place], Shea Stakes, NP   2 months ago Encounter to establish care   [facility] [place], Shea Stakes, NP       Future Appointments             In 1 week Claiborne Rigg, NP [employer] [facility]

## 2022-07-14 NOTE — Telephone Encounter (Signed)
Requested medication (s) are due for refill today: Yes  Requested medication (s) are on the active medication list: Yes  Last refill:    Future visit scheduled: Yes  Notes to clinic:  Historical provider.    Requested Prescriptions  Pending Prescriptions Disp Refills   naloxegol oxalate (MOVANTIK) 25 MG TABS tablet 30 tablet     Sig: Take 1 tablet (25 mg total) by mouth daily.     Gastroenterology: Opioid-Induced Constipation - naloxegol Passed - 07/14/2022 11:20 AM      Passed - Cr in normal range and within 360 days    Creat  Date Value Ref Range Status  02/09/2015 0.55 0.50 - 1.10 mg/dL Final   Creatinine, Ser  Date Value Ref Range Status  04/19/2022 0.79 0.57 - 1.00 mg/dL Final   Creatinine,U  Date Value Ref Range Status  04/29/2010 71.9 mg/dL Final   Creatinine, Urine  Date Value Ref Range Status  03/22/2015 27.33 >20.0 mg/dL Final    Comment:    * (PPS) Presumptive positive screen result to be verified by         quantitative LC/MS or GC/MS confirmation testing.          Passed - Valid encounter within last 12 months    Recent Outpatient Visits           1 month ago Bronchitis   [facility] [place], Shea Stakes, NP   2 months ago Encounter to establish care   [facility] [place], Shea Stakes, NP       Future Appointments             In 1 week Claiborne Rigg, NP [employer] [facility]

## 2022-07-14 NOTE — Telephone Encounter (Signed)
Pt returning call   Advised pt of message below, pt verbalized understanding  Pt inquiring status of naloxegol oxalate (MOVANTIK) 25 MG TABS tablet  refill request  Please advise

## 2022-07-14 NOTE — Telephone Encounter (Signed)
Patient called, left VM to return the call to the office. Omeprazole is not on her current medication list, but Esomeprazole is and has 1 refill left at the pharmacy. [facility] and spoke to [place], Pensions consultant about the refill(s) Levothyroxine requested. Advised it was sent on 06/30/22 #90/2 refill(s). She says it's there and waiting on her to pick up. If patient returns the call, advised her of the above and if she meant to say Esomeprazole instead of Omeprazole.

## 2022-07-19 ENCOUNTER — Ambulatory Visit: Payer: Self-pay

## 2022-07-19 NOTE — Telephone Encounter (Signed)
Chief Complaint: constipation Symptoms: abd pain, nausea, abdomen cramping, straining, rectal pain,  Frequency: onset 5 days ago Pertinent Negatives: Patient denies vomiting,fever, bloating Disposition: [] ED /[] Urgent Care (no appt availability in office) / [x] Appointment(In office/virtual)/ []  [facility] [facility]/ [] Home Care/ [] Refused Recommended Disposition /[] [facility]/ []  Follow-up with PCP Additional Notes: pt has appt on 07/21/22- advised pt will put on wait list and if sx worsen to go to Urgent Care- Pt asking for nausea prescription and Novantik rx - [facility] [address] Reason for Disposition  Last bowel movement (BM) > 4 days ago  Answer Assessment - Initial Assessment Questions 1. STOOL PATTERN OR FREQUENCY: "How often do you have a bowel movement (BM)?"  (Normal range: 3 times a day to every 3 days)  "When was your last BM?"       When on medication (Novantik) every day 2. STRAINING: "Do you have to strain to have a BM?"      yes 3. RECTAL PAIN: "Does your rectum hurt when the stool comes out?" If Yes, ask: "Do you have hemorrhoids? How bad is the pain?"  (Scale 1-10; or mild, moderate, severe)     Yes- yes 4. STOOL COMPOSITION: "Are the stools hard?"      No BM 5. BLOOD ON STOOLS: "Has there been any blood on the toilet tissue or on the surface of the BM?" If Yes, ask: "When was the last time?"     N/a 6. CHRONIC CONSTIPATION: "Is this a new problem for you?"  If No, ask: "How long have you had this problem?" (days, weeks, months)      No- 5 days 7. CHANGES IN DIET OR HYDRATION: "Have there been any recent changes in your diet?" "How much fluids are you drinking on a daily basis?"  "How much have you had to drink today?"     No-2 Gatorade, water, sprite, 2 cups coffe 8. MEDICINES: "Have you been taking any new medicines?" "Are you taking any narcotic pain medicines?" (e.g., Dilaudid, morphine, Percocet, Vicodin)     No- no 9. LAXATIVES: "Have  you been using any stool softeners, laxatives, or enemas?"  If Yes, ask "What, how often, and when was the last time?"     Yes- daily 10. ACTIVITY:  "How much walking do you do every day?"  "Has your activity level decreased in the past week?"        N/a 11. CAUSE: "What do you think is causing the constipation?"        Ran out of Novantik 12. OTHER SYMPTOMS: "Do you have any other symptoms?" (e.g., abdomen pain, bloating, fever, vomiting)       Abdominal pain and abdomen feel "hard", nausea, poor appetite,  13. MEDICAL HISTORY: "Do you have a history of hemorrhoids, rectal fissures, or rectal surgery or rectal abscess?"         no 14. PREGNANCY: "Is there any chance you are pregnant?" "When was your last menstrual period?"       N/a  Answer Assessment - Initial Assessment Questions 1. LOCATION: "Where does it hurt?"      Stomach - upper abdomen 2. RADIATION: "Does the pain shoot anywhere else?" (e.g., chest, back)     Straining hurts to back  3. ONSET: "When did the pain begin?" (e.g., minutes, hours or days ago)      4 days 4. SUDDEN: "Gradual or sudden onset?"     gradually 5. PATTERN "Does the pain  come and go, or is it constant?"    - If it comes and goes: "How long does it last?" "Do you have pain now?"     (Note: Comes and goes means the pain is intermittent. It goes away completely between bouts.)    - If constant: "Is it getting better, staying the same, or getting worse?"      (Note: Constant means the pain never goes away completely; most serious pain is constant and gets worse.)      Comes and goes when trying to go to BR 6. SEVERITY: "How bad is the pain?"  (e.g., Scale 1-10; mild, moderate, or severe)    - MILD (1-3): Doesn't interfere with normal activities, abdomen soft and not tender to touch.     - MODERATE (4-7): Interferes with normal activities or awakens from sleep, abdomen tender to touch.     - SEVERE (8-10): Excruciating pain, doubled over, unable to do any  normal activities.       Moderate- pressure and cramping 7. RECURRENT SYMPTOM: "Have you ever had this type of stomach pain before?" If Yes, ask: "When was the last time?" and "What happened that time?"      Yes-with constipation 8. CAUSE: "What do you think is causing the stomach pain?"     constipation 9. RELIEVING/AGGRAVATING FACTORS: "What makes it better or worse?" (e.g., antacids, bending or twisting motion, bowel movement)     *No Answer* 10. OTHER SYMPTOMS: "Do you have any other symptoms?" (e.g., back pain, diarrhea, fever, urination pain, vomiting)       Constipation, straining 11. PREGNANCY: "Is there any chance you are pregnant?" "When was your last menstrual period?"       *No Answer*  Protocols used: Abdominal Pain - Female-A-AH, Constipation-A-AH

## 2022-07-21 ENCOUNTER — Ambulatory Visit: Payer: Medicare HMO | Admitting: Nurse Practitioner

## 2022-07-28 NOTE — Telephone Encounter (Signed)
Pt. States she is still constipated. No abdominal pain today. Feels bloating. Eating and drinking well. Asking for medication to sent to pharmacy. Please advise pt.

## 2022-07-28 NOTE — Telephone Encounter (Signed)
Routing to CMA 

## 2022-07-28 NOTE — Telephone Encounter (Signed)
HI, are these medications something you can send to the pharmacy for this patient ?

## 2022-08-09 ENCOUNTER — Other Ambulatory Visit: Payer: Self-pay | Admitting: Pharmacist

## 2022-08-09 ENCOUNTER — Other Ambulatory Visit: Payer: Self-pay | Admitting: Nurse Practitioner

## 2022-08-09 DIAGNOSIS — R110 Nausea: Secondary | ICD-10-CM

## 2022-08-09 DIAGNOSIS — K5909 Other constipation: Secondary | ICD-10-CM

## 2022-08-09 DIAGNOSIS — J40 Bronchitis, not specified as acute or chronic: Secondary | ICD-10-CM

## 2022-08-09 MED ORDER — ONDANSETRON HCL 4 MG PO TABS: 4.0000 mg | ORAL_TABLET | Freq: Three times a day (TID) | ORAL | 1 refills | Status: DC | PRN

## 2022-08-09 MED ORDER — NALOXEGOL OXALATE 25 MG PO TABS: 25.0000 mg | ORAL_TABLET | Freq: Every day | ORAL | 1 refills | Status: DC

## 2022-08-09 NOTE — Telephone Encounter (Signed)
Medications sent

## 2022-08-09 NOTE — Telephone Encounter (Signed)
Please advise 

## 2022-08-29 ENCOUNTER — Other Ambulatory Visit: Payer: Self-pay | Admitting: Nurse Practitioner

## 2022-08-29 DIAGNOSIS — F418 Other specified anxiety disorders: Secondary | ICD-10-CM

## 2022-08-30 NOTE — Telephone Encounter (Signed)
Pt had a 30 day supply filled 8/4. The original rx had 1 RF and that used on 07/24/2022. She has no refills left.

## 2022-09-03 NOTE — Telephone Encounter (Signed)
Meds not sent . I change my pass word now my imprvida doesn't work

## 2022-09-12 ENCOUNTER — Ambulatory Visit: Payer: Medicare HMO | Admitting: Nurse Practitioner

## 2022-09-12 ENCOUNTER — Other Ambulatory Visit: Payer: Self-pay | Admitting: Nurse Practitioner

## 2022-09-12 DIAGNOSIS — I10 Essential (primary) hypertension: Secondary | ICD-10-CM

## 2022-09-18 ENCOUNTER — Other Ambulatory Visit: Payer: Self-pay | Admitting: Nurse Practitioner

## 2022-09-18 DIAGNOSIS — I10 Essential (primary) hypertension: Secondary | ICD-10-CM

## 2022-10-04 ENCOUNTER — Ambulatory Visit: Payer: Medicare HMO | Attending: Physician Assistant | Admitting: Physician Assistant

## 2022-10-04 ENCOUNTER — Encounter: Payer: Self-pay | Admitting: Physician Assistant

## 2022-10-04 VITALS — BP 122/81 | HR 106 | Wt 185.8 lb

## 2022-10-04 DIAGNOSIS — K5909 Other constipation: Secondary | ICD-10-CM

## 2022-10-04 DIAGNOSIS — M5136 Other intervertebral disc degeneration, lumbar region: Secondary | ICD-10-CM

## 2022-10-04 DIAGNOSIS — E559 Vitamin D deficiency, unspecified: Secondary | ICD-10-CM | POA: Diagnosis not present

## 2022-10-04 DIAGNOSIS — E034 Atrophy of thyroid (acquired): Secondary | ICD-10-CM | POA: Diagnosis not present

## 2022-10-04 DIAGNOSIS — I10 Essential (primary) hypertension: Secondary | ICD-10-CM

## 2022-10-04 DIAGNOSIS — Z23 Encounter for immunization: Secondary | ICD-10-CM

## 2022-10-04 DIAGNOSIS — E785 Hyperlipidemia, unspecified: Secondary | ICD-10-CM

## 2022-10-04 DIAGNOSIS — E1165 Type 2 diabetes mellitus with hyperglycemia: Secondary | ICD-10-CM | POA: Diagnosis not present

## 2022-10-04 DIAGNOSIS — M47816 Spondylosis without myelopathy or radiculopathy, lumbar region: Secondary | ICD-10-CM

## 2022-10-04 DIAGNOSIS — G894 Chronic pain syndrome: Secondary | ICD-10-CM

## 2022-10-04 DIAGNOSIS — G2581 Restless legs syndrome: Secondary | ICD-10-CM

## 2022-10-04 MED ORDER — ROSUVASTATIN CALCIUM 20 MG PO TABS: 20.0000 mg | ORAL_TABLET | Freq: Every day | ORAL | 3 refills | Status: DC

## 2022-10-04 MED ORDER — FUROSEMIDE 40 MG PO TABS: 40.0000 mg | ORAL_TABLET | Freq: Every day | ORAL | 1 refills | Status: DC

## 2022-10-04 MED ORDER — AMLODIPINE BESYLATE 5 MG PO TABS: 5.0000 mg | ORAL_TABLET | Freq: Every day | ORAL | 1 refills | Status: DC

## 2022-10-04 MED ORDER — METFORMIN HCL ER 500 MG PO TB24: 1000.0000 mg | ORAL_TABLET | Freq: Two times a day (BID) | ORAL | 1 refills | Status: DC

## 2022-10-04 MED ORDER — ENALAPRIL MALEATE 20 MG PO TABS: 20.0000 mg | ORAL_TABLET | Freq: Every day | ORAL | 5 refills | Status: DC

## 2022-10-04 MED ORDER — CYCLOBENZAPRINE HCL 5 MG PO TABS: 5.0000 mg | ORAL_TABLET | Freq: Every day | ORAL | 1 refills | Status: DC

## 2022-10-04 NOTE — Progress Notes (Signed)
Patient ID: Mary Gray, female   DOB: [birth year], 61 y.o.   MRN: [id number]   Mary Gray, is a 61 y.o. female  CBU:[id number]  EHO:[id number]  DOB - 06/20/1961  Chief Complaint  Patient presents with   Medication Refill       Subjective:   Mary Gray is a 61 y.o. female here today requesting med RF and new pain management referral to "Dr Kirtland Bouchard" at [facility].  Also c/o RLS and can't take requip.    She also has muscle spasm/muscle jumping.  Compliant with meds.  Going to get a Bp cuff for home  No problems updated.  ALLERGIES: Allergies  Allergen Reactions   Codeine Sulfate Other (See Comments)    Confusion, anxiety, fatigue   Meperidine And Related Nausea And Vomiting   Minocycline Hcl Nausea And Vomiting    Hospitalized for vomiting along with allergic reaction   Penicillins Other (See Comments)    Has patient had a PCN reaction causing immediate rash, facial/tongue/throat swelling, SOB or lightheadedness with hypotension: Yes Has patient had a PCN reaction causing severe rash involving mucus membranes or skin necrosis: Yes Has patient had a PCN reaction that required hospitalization Yes Has patient had a PCN reaction occurring within the last 10 years: Yes If all of the above answers are "NO", then may proceed with Cephalosporin use.    Propoxyphene N-Acetaminophen Other (See Comments)    confused   Tetracycline Hcl Other (See Comments)    Vomiting Note: Patient took a course of doxycycline in June 2014 without any problems.    PAST MEDICAL HISTORY: Past Medical History:  Diagnosis Date   Back pain    Chronic interstitial cystitis    Followed by Dr. Marcine Matar   COPD (chronic obstructive pulmonary disease) (HCC)    DDD (degenerative disc disease), lumbar    Degeneration of cervical intervertebral disc    Depression    Diabetes mellitus, type 2 (HCC) 2007   Heme positive stool    EGD on 04/08/2010 by Dr. Ewing Schlein showed chronic gastritis and a few gastric  polyps; pathology showed a fundic gland polyp.  Colonoscopy on 04/08/2010 showed small external and internal hemorrhoids, and a few benign-appearing diminutive polyps in the rectum, the distal sigmoid colon, and in the distal descending colon; pathology showed hyperplastic polyps.    Hypertension    Hypothyroidism    Iron deficiency anemia    Spinal stenosis     MEDICATIONS AT HOME: Prior to Admission medications   Medication Sig Start Date End Date Taking? Authorizing Provider  Accu-Chek Softclix Lancets lancets Check blood sugar 1 time per day 07/12/20  Yes Tyson Alias, MD  acetaminophen (TYLENOL) 500 MG tablet Take 1,000 mg by mouth every 6 (six) hours as needed for mild pain.   Yes [provider]  albuterol (VENTOLIN HFA) 108 (90 Base) MCG/ACT inhaler TAKE 2 PUFFS BY MOUTH EVERY 6 HOURS AS NEEDED FOR WHEEZE OR SHORTNESS OF BREATH 07/06/22  Yes Hoy Register, MD  aspirin EC 81 MG tablet Take 81 mg by mouth daily. Swallow whole.   Yes [provider]  [facility] VITAMIN B12 1000 MCG tablet TAKE 1 TABLET BY MOUTH EVERY DAY Patient taking differently: Take 1,000 mcg by mouth daily. 01/12/21  Yes Tyson Alias, MD  cyclobenzaprine (FLEXERIL) 5 MG tablet Take 1 tablet (5 mg total) by mouth at bedtime. 10/04/22  Yes Georgian Co M, PA-C  esomeprazole (NEXIUM) 40 MG capsule Take 1 capsule (40 mg total) by  mouth daily. 04/19/22 10/16/22 Yes Claiborne Rigg, NP  glucose blood (ACCU-CHEK GUIDE) test strip CHECK BLOOD SUGAR 1 TIME PER DAY 05/12/21  Yes Tyson Alias, MD  HYDROcodone-acetaminophen (NORCO/VICODIN) 5-325 MG tablet Take 1 tablet by mouth every 4 (four) hours as needed for pain. 12/30/21  Yes [provider]  hydrOXYzine (ATARAX) 25 MG tablet TAKE 1 TABLET BY MOUTH 3 TIMES DAILY. FOR ANXIETY 06/07/22  Yes Hoy Register, MD  ibuprofen (ADVIL) 200 MG tablet Take 400 mg by mouth every 6 (six) hours as needed for fever, headache or mild pain.    Yes [provider]  levothyroxine (SYNTHROID) 88 MCG tablet Take 1 tablet (88 mcg total) by mouth daily before breakfast. 06/30/22  Yes Claiborne Rigg, NP  multivitamin-iron-minerals-folic acid (CENTRUM) chewable tablet Chew 1 tablet by mouth daily. 07/12/20  Yes Tyson Alias, MD  naloxegol oxalate (MOVANTIK) 25 MG TABS tablet Take 1 tablet (25 mg total) by mouth daily. 08/09/22  Yes Claiborne Rigg, NP  nystatin (NYSTATIN) powder Apply 1 application topically 3 (three) times daily. 03/10/21  Yes Aslam, Leanna Sato, MD  ondansetron (ZOFRAN) 4 MG tablet Take 1 tablet (4 mg total) by mouth every 8 (eight) hours as needed for nausea or vomiting. 08/09/22  Yes Claiborne Rigg, NP  PREMARIN vaginal cream PLACE 1 APPLICATORFUL VAGINALLY DAILY. Patient taking differently: 1 applicator 2 (two) times a week. 11/23/21  Yes Tyson Alias, MD  promethazine-dextromethorphan (PROMETHAZINE-DM) 6.25-15 MG/5ML syrup Take 5 mLs by mouth 4 (four) times daily as needed for cough. 05/16/22  Yes Claiborne Rigg, NP  Vitamin D, Ergocalciferol, (DRISDOL) 1.25 MG (50000 UNIT) CAPS capsule Take 1 capsule (50,000 Units total) by mouth every 7 (seven) days. 04/20/22  Yes Claiborne Rigg, NP  zolpidem (AMBIEN) 5 MG tablet TAKE 1 TABLET BY MOUTH EVERY DAY AT BEDTIME AS NEEDED FOR SLEEP 09/04/22  Yes Claiborne Rigg, NP  amLODipine (NORVASC) 5 MG tablet Take 1 tablet (5 mg total) by mouth daily. 10/04/22 04/02/23  Anders Simmonds, PA-C  enalapril (VASOTEC) 20 MG tablet Take 1 tablet (20 mg total) by mouth daily. 10/04/22   Anders Simmonds, PA-C  furosemide (LASIX) 40 MG tablet Take 1 tablet (40 mg total) by mouth daily. 10/04/22 04/02/23  Anders Simmonds, PA-C  metFORMIN (GLUCOPHAGE-XR) 500 MG 24 hr tablet Take 2 tablets (1,000 mg total) by mouth 2 (two) times daily. 10/04/22   Anders Simmonds, PA-C  rosuvastatin (CRESTOR) 20 MG tablet Take 1 tablet (20 mg total) by mouth daily. 10/04/22   Aleisha Paone, Marzella Schlein, PA-C    ROS: Neg HEENT Neg resp Neg cardiac Neg GI Neg GU Neg psych Neg neuro  Objective:   Vitals:   10/04/22 1537  BP: 122/81  Pulse: (!) 106  SpO2: 95%  Weight: 185 lb 12.8 oz (84.3 kg)   Exam General appearance : Awake, alert, not in any distress. Speech Clear. Not toxic looking.  Walks with cane.  Poor mobility HEENT: Atraumatic and NormocephalicNeck: Supple, no JVD. No cervical lymphadenopathy.  Chest: Good air entry bilaterally, CTAB.  No rales/rhonchi/wheezing CVS: S1 S2 regular, no murmurs. . Extremities: B/L Lower Ext shows no edema, both legs are warm to touch Neurology: Awake alert, and oriented X 3, CN II-XII intact, Non focal Skin: No Rash  Data Review Lab Results  Component Value Date   HGBA1C 6.2 (H) 04/19/2022   HGBA1C 6.0 (H) 01/20/2022   HGBA1C 5.9 (A) 03/02/2021  Assessment & Plan   1. Essential hypertension controlled - enalapril (VASOTEC) 20 MG tablet; Take 1 tablet (20 mg total) by mouth daily.  Dispense: 30 tablet; Refill: 5 - amLODipine (NORVASC) 5 MG tablet; Take 1 tablet (5 mg total) by mouth daily.  Dispense: 90 tablet; Refill: 1 - furosemide (LASIX) 40 MG tablet; Take 1 tablet (40 mg total) by mouth daily.  Dispense: 90 tablet; Refill: 1  2. Uncontrolled type 2 diabetes mellitus with hyperglycemia (HCC) Will assess-last A1C=6.2 - metFORMIN (GLUCOPHAGE-XR) 500 MG 24 hr tablet; Take 2 tablets (1,000 mg total) by mouth 2 (two) times daily.  Dispense: 360 tablet; Refill: 1 - Hemoglobin A1c  3. Chronic constipation Can use miralax  4. Lumbar degenerative disc disease - Ambulatory referral to Pain Clinic - cyclobenzaprine (FLEXERIL) 5 MG tablet; Take 1 tablet (5 mg total) by mouth at bedtime.  Dispense: 30 tablet; Refill: 1  5. Lumbar spondylosis - Ambulatory referral to Pain Clinic - cyclobenzaprine (FLEXERIL) 5 MG tablet; Take 1 tablet (5 mg total) by mouth at bedtime.  Dispense: 30 tablet; Refill: 1  6. Chronic pain  syndrome - Ambulatory referral to Pain Clinic - cyclobenzaprine (FLEXERIL) 5 MG tablet; Take 1 tablet (5 mg total) by mouth at bedtime.  Dispense: 30 tablet; Refill: 1  7. Dyslipidemia, goal LDL below 70 - rosuvastatin (CRESTOR) 20 MG tablet; Take 1 tablet (20 mg total) by mouth daily.  Dispense: 90 tablet; Refill: 3  8. Hypothyroidism due to acquired atrophy of thyroid - Thyroid Panel With TSH  9. Restless leg syndrome Unable to take requip - Ambulatory referral to Pain Clinic - cyclobenzaprine (FLEXERIL) 5 MG tablet; Take 1 tablet (5 mg total) by mouth at bedtime.  Dispense: 30 tablet; Refill: 1  10. Vitamin D deficiency - Vitamin D, 25-hydroxy    Return in about 3 months (around 01/04/2023) for for chronic conditions.  The patient was given clear instructions to go to ER or return to medical center if symptoms don't improve, worsen or new problems develop. The patient verbalized understanding. The patient was told to call to get lab results if they haven't heard anything in the next week.      Georgian Co, PA-C [facility] [place], [place] [phone]   10/04/2022, 3:52 PM

## 2022-10-04 NOTE — Patient Instructions (Addendum)
Gaol blood pressure is <130/85    Restless Legs Syndrome Restless legs syndrome is a condition that causes uncomfortable feelings or sensations in the legs, especially while sitting or lying down. The sensations usually cause an overwhelming urge to move the legs. The arms can also sometimes be affected. The condition can range from mild to severe. The symptoms often interfere with a person's ability to sleep. What are the causes? The cause of this condition is not known. What increases the risk? The following factors may make you more likely to develop this condition: Being older than 50. Pregnancy. Being a woman. In general, the condition is more common in women than in men. A family history of the condition. Having iron deficiency. Overuse of caffeine, nicotine, or alcohol. Certain medical conditions, such as kidney disease, Parkinson's disease, or nerve damage. Certain medicines, such as those for high blood pressure, nausea, colds, allergies, depression, and some heart conditions. What are the signs or symptoms? The main symptom of this condition is uncomfortable sensations in the legs, such as: Pulling. Tingling. Prickling. Throbbing. Crawling. Burning. Usually, the sensations: Affect both sides of the body. Are worse when you sit or lie down. Are worse at night. These may make it difficult to fall asleep. Make you have a strong urge to move your legs. Are temporarily relieved by moving your legs or standing. The arms can also be affected, but this is rare. People who have this condition often have tiredness during the day because of their lack of sleep at night. How is this diagnosed? This condition may be diagnosed based on: Your symptoms. Blood tests. In some cases, you may be monitored in a sleep lab by a specialist (a sleep study). This can detect any disruptions in your sleep. How is this treated? This condition is treated by managing the symptoms. This may  include: Lifestyle changes, such as exercising, using relaxation techniques, and avoiding caffeine, alcohol, or tobacco. Iron supplements. Medicines. Parkinson's medications may be tried first. Anti-seizure medications can also be helpful. Follow these instructions at home: General instructions Take over-the-counter and prescription medicines only as told by your health care provider. Use methods to help relieve the uncomfortable sensations, such as: Massaging your legs. Walking or stretching. Taking a cold or hot bath. Keep all follow-up visits. This is important. Lifestyle     Practice good sleep habits. For example, go to bed and get up at the same time every day. Most adults should get 7-9 hours of sleep each night. Exercise regularly. Try to get at least 30 minutes of exercise most days of the week. Practice ways of relaxing, such as yoga or meditation. Avoid caffeine and alcohol. Do not use any products that contain nicotine or tobacco. These products include cigarettes, chewing tobacco, and vaping devices, such as e-cigarettes. If you need help quitting, ask your health care provider. Where to find more information [employer] of Neurological Disorders and Stroke: [employer] Contact a health care provider if: Your symptoms get worse or they do not improve with treatment. Summary Restless legs syndrome is a condition that causes uncomfortable feelings or sensations in the legs, especially while sitting or lying down. The symptoms often interfere with your ability to sleep. This condition is treated by managing the symptoms. You may need to make lifestyle changes or take medicines. This information is not intended to replace advice given to you by your health care provider. Make sure you discuss any questions you have with your health care provider. Document  Revised: 06/19/2021 Document Reviewed: 06/19/2021 Elsevier Patient Education  2023 [employer].

## 2022-10-05 ENCOUNTER — Other Ambulatory Visit: Payer: Self-pay | Admitting: Physician Assistant

## 2022-10-05 DIAGNOSIS — E1165 Type 2 diabetes mellitus with hyperglycemia: Secondary | ICD-10-CM

## 2022-10-05 LAB — VITAMIN D 25 HYDROXY (VIT D DEFICIENCY, FRACTURES): Vit D, 25-Hydroxy: 74.8 ng/mL (ref 30.0–100.0)

## 2022-10-05 LAB — THYROID PANEL WITH TSH
Free Thyroxine Index: 2.1 (ref 1.2–4.9)
T3 Uptake Ratio: 23 % — ABNORMAL LOW (ref 24–39)
T4, Total: 9 ug/dL (ref 4.5–12.0)
TSH: 1.11 u[IU]/mL (ref 0.450–4.500)

## 2022-10-05 LAB — HEMOGLOBIN A1C
Est. average glucose Bld gHb Est-mCnc: 140 mg/dL
Hgb A1c MFr Bld: 6.5 % — ABNORMAL HIGH (ref 4.8–5.6)

## 2022-10-08 ENCOUNTER — Other Ambulatory Visit: Payer: Self-pay | Admitting: Family Medicine

## 2022-10-08 DIAGNOSIS — K219 Gastro-esophageal reflux disease without esophagitis: Secondary | ICD-10-CM

## 2022-10-09 ENCOUNTER — Other Ambulatory Visit: Payer: Self-pay | Admitting: *Deleted

## 2022-10-09 DIAGNOSIS — E034 Atrophy of thyroid (acquired): Secondary | ICD-10-CM

## 2022-10-10 NOTE — Telephone Encounter (Signed)
Rx 06/30/22 #90 2RF- too soon Requested Prescriptions  Pending Prescriptions Disp Refills   levothyroxine (SYNTHROID) 88 MCG tablet 90 tablet 2    Sig: Take 1 tablet (88 mcg total) by mouth daily before breakfast.     Endocrinology:  Hypothyroid Agents Passed - 10/09/2022  3:52 PM      Passed - TSH in normal range and within 360 days    TSH  Date Value Ref Range Status  10/04/2022 1.110 0.450 - 4.500 uIU/mL Final         Passed - Valid encounter within last 12 months    Recent Outpatient Visits           6 days ago Chronic constipation   [facility] [place], [place], [place]   4 months ago Bronchitis   [facility] [place], Shea Stakes, NP   5 months ago Encounter to establish care   [facility] [place], Shea Stakes, NP       Future Appointments             In 2 months Claiborne Rigg, NP [facility]

## 2022-10-13 DIAGNOSIS — G8929 Other chronic pain: Secondary | ICD-10-CM | POA: Diagnosis not present

## 2022-10-13 DIAGNOSIS — Z87898 Personal history of other specified conditions: Secondary | ICD-10-CM | POA: Diagnosis not present

## 2022-10-13 DIAGNOSIS — Z0000 Encounter for general adult medical examination without abnormal findings: Secondary | ICD-10-CM | POA: Diagnosis not present

## 2022-10-13 DIAGNOSIS — Z1231 Encounter for screening mammogram for malignant neoplasm of breast: Secondary | ICD-10-CM | POA: Diagnosis not present

## 2022-10-13 DIAGNOSIS — Z79899 Other long term (current) drug therapy: Secondary | ICD-10-CM | POA: Diagnosis not present

## 2022-10-13 DIAGNOSIS — Z1159 Encounter for screening for other viral diseases: Secondary | ICD-10-CM | POA: Diagnosis not present

## 2022-10-13 DIAGNOSIS — M549 Dorsalgia, unspecified: Secondary | ICD-10-CM | POA: Diagnosis not present

## 2022-10-13 DIAGNOSIS — Z6839 Body mass index (BMI) 39.0-39.9, adult: Secondary | ICD-10-CM | POA: Diagnosis not present

## 2022-10-13 DIAGNOSIS — Z0130 Encounter for examination of blood pressure without abnormal findings: Secondary | ICD-10-CM | POA: Diagnosis not present

## 2022-10-15 ENCOUNTER — Other Ambulatory Visit: Payer: Self-pay | Admitting: Nurse Practitioner

## 2022-10-15 DIAGNOSIS — F32A Depression, unspecified: Secondary | ICD-10-CM

## 2022-10-16 ENCOUNTER — Other Ambulatory Visit: Payer: Self-pay | Admitting: Adult Medicine

## 2022-10-16 DIAGNOSIS — Z1231 Encounter for screening mammogram for malignant neoplasm of breast: Secondary | ICD-10-CM

## 2022-10-17 NOTE — Telephone Encounter (Signed)
Unable to refill per protocol, Rx expired. Medication was discontinued 10/04/22. Will refuse.  Requested Prescriptions  Pending Prescriptions Disp Refills   amitriptyline (ELAVIL) 25 MG tablet [Pharmacy Med Name: AMITRIPTYLINE HCL 25 MG TAB] 90 tablet 1    Sig: TAKE 1 TABLET BY MOUTH EVERYDAY AT BEDTIME     Psychiatry:  Antidepressants - Heterocyclics (TCAs) Passed - 10/15/2022  1:24 AM      Passed - Valid encounter within last 6 months    Recent Outpatient Visits           1 week ago Chronic constipation   [facility] [place], [place], [place]   5 months ago Bronchitis   [facility] [place], Shea Stakes, NP   6 months ago Encounter to establish care   [facility] [place], Shea Stakes, NP       Future Appointments             In 2 months Claiborne Rigg, NP [facility]

## 2022-10-18 ENCOUNTER — Ambulatory Visit: Payer: Self-pay | Admitting: *Deleted

## 2022-10-18 NOTE — Telephone Encounter (Signed)
Summary: Cold/Flu symptoms, no appt   Pt called to report a series of cold/flu symptoms. Seeking relief, no appt available soon Best contact: [phone]       Chief Complaint: cough can't cough up phlegm Symptoms: coughing spells, can not get up mucus. Sounds like "croup". Reports boyfriend says she is wheezing. Taking nyquil with no relief. Shortness of breath with coughing episodes. Has not tested for covid or flu Frequency: 2 days  Pertinent Negatives: Patient denies chest pain no difficulty breathing. No fever Disposition: [] ED /[x] Urgent Care (no appt availability in office) / [] Appointment(In office/virtual)/ []   Virtual Care/ [] Home Care/ [] Refused Recommended Disposition /[]  Mobile Bus/ []  Follow-up with PCP Additional Notes:   Recommended UC / mobile bus . Unsure of disposition. Please advise      Reason for Disposition  Wheezing is present  Answer Assessment - Initial Assessment Questions 1. ONSET: "When did the cough begin?"      2 days ago  2. SEVERITY: "How bad is the cough today?"      Can't stop coughing  3. SPUTUM: "Describe the color of your sputum" (none, dry cough; clear, white, yellow, green)     Unable to get mucus up  4. HEMOPTYSIS: "Are you coughing up any blood?" If so ask: "How much?" (flecks, streaks, tablespoons, etc.)     na 5. DIFFICULTY BREATHING: "Are you having difficulty breathing?" If Yes, ask: "How bad is it?" (e.g., mild, moderate, severe)    - MILD: No SOB at rest, mild SOB with walking, speaks normally in sentences, can lie down, no retractions, pulse < 100.    - MODERATE: SOB at rest, SOB with minimal exertion and prefers to sit, cannot lie down flat, speaks in phrases, mild retractions, audible wheezing, pulse 100-120.    - SEVERE: Very SOB at rest, speaks in single words, struggling to breathe, sitting hunched forward, retractions, pulse > 120      Shortness of breath with coughing 6. FEVER: "Do you have a fever?" If  Yes, ask: "What is your temperature, how was it measured, and when did it start?"     na 7. CARDIAC HISTORY: "Do you have any history of heart disease?" (e.g., heart attack, congestive heart failure)      Na  8. LUNG HISTORY: "Do you have any history of lung disease?"  (e.g., pulmonary embolus, asthma, emphysema)     No , is using inhaler  9. PE RISK FACTORS: "Do you have a history of blood clots?" (or: recent major surgery, recent prolonged travel, bedridden)     na 10. OTHER SYMPTOMS: "Do you have any other symptoms?" (e.g., runny nose, wheezing, chest pain)       Wheezing noted per patient's boyfriend , cough sounds like "croup" 11. PREGNANCY: "Is there any chance you are pregnant?" "When was your last menstrual period?"       na 12. TRAVEL: "Have you traveled out of the country in the last month?" (e.g., travel history, exposures)       na  Protocols used: Cough - Acute Productive-A-AH

## 2022-10-19 ENCOUNTER — Telehealth: Payer: Self-pay | Admitting: Nurse Practitioner

## 2022-10-19 NOTE — Telephone Encounter (Signed)
Medication Refill - Medication: amitriptyline (ELAVIL) 25 MG tablet [[id number]]  DISCONTINUED   Pt would like to know why this medication was discontinued  Has the patient contacted their pharmacy? Yes.   (Agent: If no, request that the patient contact the pharmacy for the refill. If patient does not wish to contact the pharmacy document the reason why and proceed with request.) (Agent: If yes, when and what did the pharmacy advise?)  Preferred Pharmacy (with phone number or street name): [id number] [place], [place] - [address] [place] [address] Phone: [phone]  Fax: [phone]  Has the patient been seen for an appointment in the last year OR does the patient have an upcoming appointment? Yes.    Agent: Please be advised that RX refills may take up to 3 business days. We ask that you follow-up with your pharmacy.

## 2022-10-19 NOTE — Telephone Encounter (Signed)
PT had called in earlier in a NT encounter wanting meds called in, [place] had returned call. Pt did not get the call in time. Pt states has no way to get to ED or UC and wants medication as no one to take her to be seen anywhere. FU with pt [phone]

## 2022-10-19 NOTE — Telephone Encounter (Signed)
Return call unanswered.  

## 2022-10-20 NOTE — Telephone Encounter (Signed)
Called patient to schedule a virtual appointment for this morning with [facility].  Left message on voicemail to return call.

## 2022-10-20 NOTE — Telephone Encounter (Signed)
amitriptyline (ELAVIL) 25 MG is not on current medication list. Routing for review.

## 2022-10-20 NOTE — Telephone Encounter (Signed)
Hey [name]. This looks like it was discontinued by another provider last month. Are you still wanting her to be on this medication?

## 2022-10-23 ENCOUNTER — Other Ambulatory Visit: Payer: Self-pay | Admitting: Nurse Practitioner

## 2022-10-23 MED ORDER — AMITRIPTYLINE HCL 25 MG PO TABS: 25.0000 mg | ORAL_TABLET | Freq: Every day | ORAL | 3 refills | Status: DC

## 2022-11-06 ENCOUNTER — Other Ambulatory Visit: Payer: Self-pay | Admitting: Nurse Practitioner

## 2022-11-07 ENCOUNTER — Other Ambulatory Visit: Payer: Self-pay | Admitting: Nurse Practitioner

## 2022-11-07 ENCOUNTER — Ambulatory Visit: Payer: Self-pay

## 2022-11-07 DIAGNOSIS — R110 Nausea: Secondary | ICD-10-CM

## 2022-11-07 MED ORDER — ONDANSETRON HCL 4 MG PO TABS: 4.0000 mg | ORAL_TABLET | Freq: Three times a day (TID) | ORAL | 1 refills | Status: DC | PRN

## 2022-11-07 NOTE — Telephone Encounter (Signed)
Medication Refill - Medication: ondansetron (ZOFRAN) 4 MG tablet [[id number]]    Has the patient contacted their pharmacy? No. (Agent: If no, request that the patient contact the pharmacy for the refill. If patient does not wish to contact the pharmacy document the reason why and proceed with request.) (Agent: If yes, when and what did the pharmacy advise?)  Preferred Pharmacy (with phone number or street name): [id number] [place], [place] - [address]  Has the patient been seen for an appointment in the last year OR does the patient have an upcoming appointment? Yes.    Agent: Please be advised that RX refills may take up to 3 business days. We ask that you follow-up with your pharmacy.

## 2022-11-07 NOTE — Telephone Encounter (Signed)
Message from Areatha Keas sent at 11/07/2022  9:51 AM EST  Summary: nausea   Pt is experiencing nausea and headaches and asked if her zofran can be refilled / please advise        Zofran prescription request was already sent to office for review. Called pt and LM on VM to call back to discuss sx.

## 2022-11-07 NOTE — Telephone Encounter (Signed)
Requested medication (s) are due for refill today -yes  Requested medication (s) are on the active medication list -yes  Future visit scheduled -yes  Last refill: 08/09/22 #30 1RF  Notes to clinic: non delegated Rx  Requested Prescriptions  Pending Prescriptions Disp Refills   ondansetron (ZOFRAN) 4 MG tablet 30 tablet 1    Sig: Take 1 tablet (4 mg total) by mouth every 8 (eight) hours as needed for nausea or vomiting.     Not Delegated - Gastroenterology: Antiemetics - ondansetron Failed - 11/07/2022  9:55 AM      Failed - This refill cannot be delegated      Passed - AST in normal range and within 360 days    AST  Date Value Ref Range Status  04/19/2022 25 0 - 40 IU/L Final         Passed - ALT in normal range and within 360 days    ALT  Date Value Ref Range Status  04/19/2022 17 0 - 32 IU/L Final         Passed - Valid encounter within last 6 months    Recent Outpatient Visits           1 month ago Chronic constipation   [facility] [place], [place], [place]   5 months ago Bronchitis   [place] [address] [facility] [place], Shea Stakes, NP   6 months ago Encounter to establish care   [facility] [place], Shea Stakes, NP       Future Appointments             In 1 month Claiborne Rigg, NP [facility]               Requested Prescriptions  Pending Prescriptions Disp Refills   ondansetron (ZOFRAN) 4 MG tablet 30 tablet 1    Sig: Take 1 tablet (4 mg total) by mouth every 8 (eight) hours as needed for nausea or vomiting.     Not Delegated - Gastroenterology: Antiemetics - ondansetron Failed - 11/07/2022  9:55 AM      Failed - This refill cannot be delegated      Passed - AST in normal range and within 360 days    AST  Date Value Ref Range Status  04/19/2022 25 0 - 40 IU/L Final         Passed - ALT in normal range and within 360 days    ALT  Date Value  Ref Range Status  04/19/2022 17 0 - 32 IU/L Final         Passed - Valid encounter within last 6 months    Recent Outpatient Visits           1 month ago Chronic constipation   [facility] [place], [place], [place]   5 months ago Bronchitis   [facility] [place], Shea Stakes, NP   6 months ago Encounter to establish care   [facility] [place], Shea Stakes, NP       Future Appointments             In 1 month Claiborne Rigg, NP [facility]

## 2022-11-07 NOTE — Telephone Encounter (Signed)
  Chief Complaint: Nausea Symptoms: Nausea, headache 8/10. States is staying hydrated today, no appetite but able to keep food down. No vomiting.  Frequency: 2 days Pertinent Negatives: Patient denies fever, pain, vomiting, no new meds. Disposition: [] ED /[] Urgent Care (no appt availability in office) / [] Appointment(In office/virtual)/ []  [facility]/ [] Home Care/ [] Refused Recommended Disposition /[] [place] [facility]/ [x]  Follow-up with PCP Additional Notes: States "Just get nauseated at times, don't know why." Requesting Zofran be refilled.No availability at practice. Care advise provided, pt verbalizes understanding.  Please advise. Reason for Disposition  Nausea lasts > 1 week    Current episode 2 days, has H/O nausea  Answer Assessment - Initial Assessment Questions 1. NAUSEA SEVERITY: "How bad is the nausea?" (e.g., mild, moderate, severe; dehydration, weight loss)   - MILD: loss of appetite without change in eating habits   - MODERATE: decreased oral intake without significant weight loss, dehydration, or malnutrition   - SEVERE: inadequate caloric or fluid intake, significant weight loss, symptoms of dehydration     Moderate 2. ONSET: "When did the nausea begin?"     2 days ago 3. VOMITING: "Any vomiting?" If Yes, ask: "How many times today?"     No 4. RECURRENT SYMPTOM: "Have you had nausea before?" If Yes, ask: "When was the last time?" "What happened that time?"     Yes, unsure when. 5. CAUSE: "What do you think is causing the nausea?"     Unsure. Has headache, 8/10. Urine a little darker than usual, mild cough.  Protocols used: Nausea-A-AH

## 2022-11-15 ENCOUNTER — Other Ambulatory Visit: Payer: Self-pay | Admitting: Nurse Practitioner

## 2022-11-15 DIAGNOSIS — F418 Other specified anxiety disorders: Secondary | ICD-10-CM

## 2022-11-27 ENCOUNTER — Other Ambulatory Visit: Payer: Self-pay | Admitting: Physician Assistant

## 2022-11-27 DIAGNOSIS — M47816 Spondylosis without myelopathy or radiculopathy, lumbar region: Secondary | ICD-10-CM

## 2022-11-27 DIAGNOSIS — G894 Chronic pain syndrome: Secondary | ICD-10-CM

## 2022-11-27 DIAGNOSIS — G2581 Restless legs syndrome: Secondary | ICD-10-CM

## 2022-11-27 DIAGNOSIS — M5136 Other intervertebral disc degeneration, lumbar region: Secondary | ICD-10-CM

## 2022-11-28 ENCOUNTER — Ambulatory Visit: Payer: Self-pay | Admitting: *Deleted

## 2022-11-28 NOTE — Telephone Encounter (Signed)
Requested medication (s) are due for refill today -yes  Requested medication (s) are on the active medication list -yes  Future visit scheduled yes  Last refill: 10/04/22 #30 1RF  Notes to clinic: non delegated Rx  Requested Prescriptions  Pending Prescriptions Disp Refills   cyclobenzaprine (FLEXERIL) 5 MG tablet [Pharmacy Med Name: CYCLOBENZAPRINE 5 MG TABLET] 30 tablet 1    Sig: TAKE 1 TABLET BY MOUTH EVERYDAY AT BEDTIME     Not Delegated - Analgesics:  Muscle Relaxants Failed - 11/27/2022  4:01 PM      Failed - This refill cannot be delegated      Passed - Valid encounter within last 6 months    Recent Outpatient Visits           1 month ago Chronic constipation   [place], [place], [place]   6 months ago [place], Vernia Buff, NP   7 months ago Encounter to establish care   [place], Vernia Buff, NP       Future Appointments             In 1 month Gildardo Pounds, NP [place]               Requested Prescriptions  Pending Prescriptions Disp Refills   cyclobenzaprine (FLEXERIL) 5 MG tablet [Pharmacy Med Name: CYCLOBENZAPRINE 5 MG TABLET] 30 tablet 1    Sig: TAKE 1 TABLET BY MOUTH EVERYDAY AT BEDTIME     Not Delegated - Analgesics:  Muscle Relaxants Failed - 11/27/2022  4:01 PM      Failed - This refill cannot be delegated      Passed - Valid encounter within last 6 months    Recent Outpatient Visits           1 month ago Chronic constipation   [place], [place], [place]   6 months ago [place], Vernia Buff, NP   7 months ago Encounter to establish care   [place], Vernia Buff, NP       Future Appointments             In 1 month Gildardo Pounds, NP [place]

## 2022-11-28 NOTE — Telephone Encounter (Signed)
Attempted to reach, routing to practice for providers resolution per protocol.

## 2022-11-28 NOTE — Telephone Encounter (Signed)
Summary: pt denied med/ what she should do?   cyclobenzaprine (FLEXERIL) 5 MG tablet Pt has called wanting to know why this med was refused as her legs are really hurting her and what is she supposed to do, Pls fu with pt at [phone]. States someone has got to give her something for the pain in her legs.

## 2022-11-29 ENCOUNTER — Telehealth: Payer: Self-pay | Admitting: *Deleted

## 2022-11-29 ENCOUNTER — Other Ambulatory Visit: Payer: Self-pay | Admitting: Nurse Practitioner

## 2022-11-29 MED ORDER — GABAPENTIN 300 MG PO CAPS: 300.0000 mg | ORAL_CAPSULE | Freq: Three times a day (TID) | ORAL | 3 refills | Status: DC

## 2022-11-29 NOTE — Telephone Encounter (Signed)
Pt returned call, states she is taking one tab each evening. Reviewed prescription and refill -10/04/22 #30  1 refill- states she did receive  refill "I don't know what happened to them, took last one last night." Questioning if there is another med available to help with leg spasms, cramping.  Assured pt NT would route to practice for PCPs review and final disposition.

## 2022-11-29 NOTE — Telephone Encounter (Signed)
Attempted to reach pt to review message from Pharmacist. Left VM to call back.

## 2022-11-29 NOTE — Telephone Encounter (Signed)
Error

## 2022-12-18 ENCOUNTER — Other Ambulatory Visit: Payer: Self-pay | Admitting: Family Medicine

## 2022-12-18 DIAGNOSIS — R110 Nausea: Secondary | ICD-10-CM

## 2022-12-19 NOTE — Telephone Encounter (Signed)
Requested medication (s) are due for refill today: Yes  Requested medication (s) are on the active medication list: Yes  Last refill:  11/07/22  Future visit scheduled: Yes  Notes to clinic:  See request.    Requested Prescriptions  Pending Prescriptions Disp Refills   ondansetron (ZOFRAN) 4 MG tablet [Pharmacy Med Name: ONDANSETRON HCL 4 MG TABLET] 30 tablet 1    Sig: TAKE 1 TABLET BY MOUTH EVERY 8 HOURS AS NEEDED FOR NAUSEA AND VOMITING     Not Delegated - Gastroenterology: Antiemetics - ondansetron Failed - 12/18/2022  2:47 PM      Failed - This refill cannot be delegated      Passed - AST in normal range and within 360 days    AST  Date Value Ref Range Status  04/19/2022 25 0 - 40 IU/L Final         Passed - ALT in normal range and within 360 days    ALT  Date Value Ref Range Status  04/19/2022 17 0 - 32 IU/L Final         Passed - Valid encounter within last 6 months    Recent Outpatient Visits           2 months ago Chronic constipation   [place], [place]   7 months ago [place], Vernia Buff, NP   8 months ago Encounter to establish care   [place] Gildardo Pounds, NP       Future Appointments             In 2 weeks Gildardo Pounds, NP [place]

## 2022-12-21 ENCOUNTER — Ambulatory Visit: Payer: Self-pay | Admitting: *Deleted

## 2022-12-21 NOTE — Telephone Encounter (Signed)
Patient has been informed of OTC 1,000 units daily.

## 2022-12-21 NOTE — Telephone Encounter (Signed)
Summary: Vitamin D  mg   Patient is wanting to know what MG of Vitamin D she should be taking.            Chief Complaint: requesting dose of vit D Symptoms: na  Frequency: na Pertinent Negatives: Patient denies na Disposition: [] ED /[] Urgent Care (no appt availability in office) / [] Appointment(In office/virtual)/ []  [place] [facility]/ [] Home Care/ [] Refused Recommended Disposition /[] [place] [facility]/ [x]  Follow-up with PCP Additional Notes:  Reports pharmacy did not have dose of vit D ordered and would like to know what dose to take or reorder from pharmacy. In review of chart, vit D 1.25mg  (50000units) discontinued by Marga Melnick, PA 10/05/22. Do you want patient to take vit D and if so what dose?      Reason for Disposition  [1] Caller has NON-URGENT medicine question about med that PCP prescribed AND [2] triager unable to answer question  Answer Assessment - Initial Assessment Questions 1. NAME of MEDICINE: "What medicine(s) are you calling about?"     Vit D 2. QUESTION: "What is your question?" (e.g., double dose of medicine, side effect)     What dosage should be taken? 3. PRESCRIBER: "Who prescribed the medicine?" Reason: if prescribed by specialist, call should be referred to that group.     PCP 4. SYMPTOMS: "Do you have any symptoms?" If Yes, ask: "What symptoms are you having?"  "How bad are the symptoms (e.g., mild, moderate, severe)     Patient reports trying to pick up vit D and pharmacy did not have dose needed.  5. PREGNANCY:  "Is there any chance that you are pregnant?" "When was your last menstrual period?"     na  Protocols used: Medication Question Call-A-AH

## 2022-12-23 ENCOUNTER — Ambulatory Visit (HOSPITAL_COMMUNITY)
Admission: EM | Admit: 2022-12-23 | Discharge: 2022-12-23 | Disposition: A | Discharge status: Home / Self Care | Payer: Medicare HMO | Attending: Internal Medicine | Admitting: Internal Medicine

## 2022-12-23 ENCOUNTER — Encounter (HOSPITAL_COMMUNITY): Payer: Self-pay

## 2022-12-23 DIAGNOSIS — F17200 Nicotine dependence, unspecified, uncomplicated: Secondary | ICD-10-CM | POA: Diagnosis not present

## 2022-12-23 DIAGNOSIS — J069 Acute upper respiratory infection, unspecified: Secondary | ICD-10-CM

## 2022-12-23 DIAGNOSIS — Z1152 Encounter for screening for COVID-19: Secondary | ICD-10-CM | POA: Diagnosis not present

## 2022-12-23 DIAGNOSIS — J40 Bronchitis, not specified as acute or chronic: Secondary | ICD-10-CM

## 2022-12-23 DIAGNOSIS — J441 Chronic obstructive pulmonary disease with (acute) exacerbation: Secondary | ICD-10-CM | POA: Diagnosis not present

## 2022-12-23 HISTORY — DX: Unspecified asthma, uncomplicated: J45.909

## 2022-12-23 MED ORDER — IPRATROPIUM-ALBUTEROL 0.5-2.5 (3) MG/3ML IN SOLN
3.0000 mL | Freq: Once | RESPIRATORY_TRACT | Status: AC
  Administered 2022-12-23: 3 mL via RESPIRATORY_TRACT

## 2022-12-23 MED ORDER — AZITHROMYCIN 250 MG PO TABS: 250.0000 mg | ORAL_TABLET | Freq: Every day | ORAL | 0 refills | Status: DC

## 2022-12-23 MED ORDER — METHYLPREDNISOLONE SODIUM SUCC 125 MG IJ SOLR
INTRAMUSCULAR | Status: AC
  Filled 2022-12-23: qty 2

## 2022-12-23 MED ORDER — PREDNISONE 20 MG PO TABS: 40.0000 mg | ORAL_TABLET | Freq: Every day | ORAL | 0 refills | Status: AC

## 2022-12-23 MED ORDER — METHYLPREDNISOLONE SODIUM SUCC 125 MG IJ SOLR
80.0000 mg | Freq: Once | INTRAMUSCULAR | Status: AC
  Administered 2022-12-23: 80 mg via INTRAMUSCULAR

## 2022-12-23 MED ORDER — ALBUTEROL SULFATE HFA 108 (90 BASE) MCG/ACT IN AERS: 1.0000 | INHALATION_SPRAY | Freq: Four times a day (QID) | RESPIRATORY_TRACT | 0 refills | Status: DC | PRN

## 2022-12-23 MED ORDER — IPRATROPIUM-ALBUTEROL 0.5-2.5 (3) MG/3ML IN SOLN
RESPIRATORY_TRACT | Status: AC
  Filled 2022-12-23: qty 3

## 2022-12-23 NOTE — Discharge Instructions (Addendum)
You have a viral upper respiratory infection.  COVID-19 testing is pending. We will call you with results if positive. If your COVID test is positive, you must stay at home until day 6 of symptoms. On day 6, you may go out into public and go back to work, but you must wear a mask until day 11 of symptoms to prevent spread to others.  Use the following medicines to help with symptoms: - Cetirizine 10mg  once daily to help with nasal congestion/dry up mucous. - Tylenol 1,000mg  every 6 hours as needed for fever. - Tessalon perles every 8 hours as needed for cough. - Take steroid as directed (prednisone) 40mg  once daily for the next 5 days with breakfast. Do not take any NSAIDs when taking steroid (ibuprofen/naproxen). - Take azithromycin antibiotic as prescribed.  1 tablespoon of honey in warm water and/or salt water gargles may also help with symptoms. Humidifier to your room will help add water to the air and reduce coughing.  If you develop any new or worsening symptoms, please return.  If your symptoms are severe, please go to the emergency room.  Follow-up with your primary care provider for further evaluation and management of your symptoms as well as ongoing wellness visits.  I hope you feel better!

## 2022-12-23 NOTE — ED Triage Notes (Addendum)
Chief Complaint: sore throat, bodyaches, productive cough with green mucus, chest congestion, bilateral ear pain. Patient has history of COPD.   Onset: Thursday   Prescriptions or OTC medications tried: Yes- Nyquil, nasal spray, inhaler, tylenol     with mild relief  Sick exposure: No

## 2022-12-24 LAB — SARS CORONAVIRUS 2 (TAT 6-24 HRS): SARS Coronavirus 2: NEGATIVE

## 2022-12-25 ENCOUNTER — Telehealth (HOSPITAL_COMMUNITY): Payer: Self-pay | Admitting: Family Medicine

## 2022-12-25 MED ORDER — BENZONATATE 100 MG PO CAPS: 100.0000 mg | ORAL_CAPSULE | Freq: Three times a day (TID) | ORAL | 0 refills | Status: DC | PRN

## 2022-12-25 NOTE — Telephone Encounter (Signed)
[facility]; pt was to get rx at visit, but not sent.

## 2022-12-26 NOTE — ED Provider Notes (Signed)
[facility]    CSN: [id number] Arrival date & time: 12/23/22  1715      History   Chief Complaint Chief Complaint  Patient presents with   URI   Shortness of Breath    HPI Mary Gray is a 62 y.o. female.   Patient presents to urgent care for evaluation of cough, shortness of breath, wheezing, chills, nasal congestion, and sore throat that started 2 days ago. No known sick contacts with similar symptoms. She is currently short of breath at rest and with exertion. History of COPD, currently an every day smoker but trying to cut back. History of asthma, type 2 diabetes, and hypertension as well. Has been using albuterol inhaler as needed without relief of shortness of breath. Denies nausea, vomiting, diarrhea, dizziness, orthopnea, chest pain, leg swelling, and recent antibiotic/steroid use. She has not been hospitalized recently due to respiratory complaint and does not use supplemental oxygen at home. She has been using over the counter cough and cold medicines to help with symptoms without relief.   The history is provided by the patient. No language interpreter was used.  URI Shortness of Breath   Past Medical History:  Diagnosis Date   Asthma    Back pain    Chronic interstitial cystitis    Followed by Dr. Marcine Matar   COPD (chronic obstructive pulmonary disease) (HCC)    DDD (degenerative disc disease), lumbar    Degeneration of cervical intervertebral disc    Depression    Diabetes mellitus, type 2 (HCC) 2007   Heme positive stool    EGD on 04/08/2010 by Dr. Ewing Schlein showed chronic gastritis and a few gastric polyps; pathology showed a fundic gland polyp.  Colonoscopy on 04/08/2010 showed small external and internal hemorrhoids, and a few benign-appearing diminutive polyps in the rectum, the distal sigmoid colon, and in the distal descending colon; pathology showed hyperplastic polyps.    Hypertension    Hypothyroidism    Iron deficiency anemia     Spinal stenosis     Patient Active Problem List   Diagnosis Date Noted   Acute respiratory failure with hypoxia (HCC)    COVID-19 virus infection 01/20/2022   Lumbar spondylosis 03/22/2021   Chronic radicular lumbar pain 03/22/2021   Bilateral primary osteoarthritis of knee 03/22/2021   Chronic pain syndrome 03/22/2021   Tobacco use disorder 03/22/2021   Hyponatremia 03/04/2021   Chronic bilateral low back pain without sciatica 12/13/2020   Sleep disturbance 12/13/2020   Vaginitis 10/27/2020   Right ear pain 10/13/2020   Nausea 02/10/2020   B12 deficiency 11/25/2018   Urinary, incontinence, stress female 09/09/2018   Primary osteoarthritis of right knee 05/01/2018   Chronic venous insufficiency 02/26/2017   Candidal skin infection 03/03/2014   Preventative health care 05/14/2013   Spinal stenosis, lumbar region, with neurogenic claudication 03/01/2011   Hypothyroidism 07/06/2006   Diabetes mellitus type 2, uncontrolled 07/06/2006   Hyperlipemia 07/06/2006   Generalized anxiety disorder 07/06/2006   Essential hypertension 07/06/2006   GERD 07/06/2006   Lumbar degenerative disc disease 07/06/2006    Past Surgical History:  Procedure Laterality Date   CARPAL TUNNEL RELEASE  05/08/2000   By Dr. Katy Fitch. Sypher, Montez Hageman.    OB History   No obstetric history on file.      Home Medications    Prior to Admission medications   Medication Sig Start Date End Date Taking? Authorizing Provider  acetaminophen (TYLENOL) 500 MG tablet Take 1,000 mg by mouth every  6 (six) hours as needed for mild pain.   Yes [provider]  albuterol (VENTOLIN HFA) 108 (90 Base) MCG/ACT inhaler Inhale 1-2 puffs into the lungs every 6 (six) hours as needed for wheezing or shortness of breath. 12/23/22  Yes Talbot Grumbling, FNP  amitriptyline (ELAVIL) 25 MG tablet Take 1 tablet (25 mg total) by mouth at bedtime. 10/23/22  Yes Gildardo Pounds, NP  amLODipine (NORVASC) 5 MG tablet Take 1  tablet (5 mg total) by mouth daily. 10/04/22 04/02/23 Yes McClung, Dionne Bucy, PA-C  aspirin EC 81 MG tablet Take 81 mg by mouth daily. Swallow whole.   Yes [provider]  azithromycin (ZITHROMAX) 250 MG tablet Take 1 tablet (250 mg total) by mouth daily. Take first 2 tablets together, then 1 every day until finished. 12/23/22  Yes Keiron Iodice, Stasia Cavalier, FNP  [facility] VITAMIN B12 1000 MCG tablet TAKE 1 TABLET BY MOUTH EVERY DAY Patient taking differently: Take 1,000 mcg by mouth daily. 01/12/21  Yes Axel Filler, MD  cyclobenzaprine (FLEXERIL) 5 MG tablet Take 1 tablet (5 mg total) by mouth at bedtime. 10/04/22  Yes Freeman Caldron M, PA-C  enalapril (VASOTEC) 20 MG tablet Take 1 tablet (20 mg total) by mouth daily. 10/04/22  Yes McClung, [name] M, PA-C  esomeprazole (NEXIUM) 40 MG capsule TAKE 1 CAPSULE (40 MG TOTAL) BY MOUTH DAILY. 10/09/22 04/07/23 Yes Charlott Rakes, MD  furosemide (LASIX) 40 MG tablet Take 1 tablet (40 mg total) by mouth daily. 10/04/22 04/02/23 Yes McClung, Dionne Bucy, PA-C  gabapentin (NEURONTIN) 300 MG capsule Take 1 capsule (300 mg total) by mouth 3 (three) times daily. 11/29/22  Yes Gildardo Pounds, NP  glucose blood (ACCU-CHEK GUIDE) test strip CHECK BLOOD SUGAR 1 TIME PER DAY 05/12/21  Yes Axel Filler, MD  HYDROcodone-acetaminophen (NORCO/VICODIN) 5-325 MG tablet Take 1 tablet by mouth every 4 (four) hours as needed for pain. 12/30/21  Yes [provider]  hydrOXYzine (ATARAX) 25 MG tablet TAKE 1 TABLET BY MOUTH 3 TIMES DAILY. FOR ANXIETY 06/07/22  Yes Charlott Rakes, MD  ibuprofen (ADVIL) 200 MG tablet Take 400 mg by mouth every 6 (six) hours as needed for fever, headache or mild pain.   Yes [provider]  levothyroxine (SYNTHROID) 88 MCG tablet Take 1 tablet (88 mcg total) by mouth daily before breakfast. 06/30/22  Yes Gildardo Pounds, NP  metFORMIN (GLUCOPHAGE-XR) 500 MG 24 hr tablet Take 2 tablets (1,000 mg total) by mouth 2 (two)  times daily. 10/04/22  Yes Freeman Caldron M, PA-C  multivitamin-iron-minerals-folic acid (CENTRUM) chewable tablet Chew 1 tablet by mouth daily. 07/12/20  Yes Axel Filler, MD  naloxegol oxalate (MOVANTIK) 25 MG TABS tablet Take 1 tablet (25 mg total) by mouth daily. 08/09/22  Yes Gildardo Pounds, NP  nystatin (NYSTATIN) powder Apply 1 application topically 3 (three) times daily. 03/10/21  Yes Aslam, Sadia, MD  ondansetron (ZOFRAN) 4 MG tablet TAKE 1 TABLET BY MOUTH EVERY 8 HOURS AS NEEDED FOR NAUSEA AND VOMITING 12/20/22  Yes Newlin, Enobong, MD  predniSONE (DELTASONE) 20 MG tablet Take 2 tablets (40 mg total) by mouth daily for 5 days. 12/23/22 12/28/22 Yes Sherri Levenhagen, Stasia Cavalier, FNP  PREMARIN vaginal cream PLACE 1 APPLICATORFUL VAGINALLY DAILY. Patient taking differently: 1 applicator 2 (two) times a week. 11/23/21  Yes Axel Filler, MD  rosuvastatin (CRESTOR) 20 MG tablet Take 1 tablet (20 mg total) by mouth daily. 10/04/22  Yes Argentina Donovan, PA-C  zolpidem (AMBIEN) 5 MG tablet TAKE 1 TABLET BY MOUTH AT BEDTIME AS NEEDED FOR SLEEP 11/15/22  Yes Gildardo Pounds, NP  Accu-Chek Softclix Lancets lancets Check blood sugar 1 time per day 07/12/20   Axel Filler, MD  benzonatate (TESSALON) 100 MG capsule Take 1 capsule (100 mg total) by mouth 3 (three) times daily as needed for cough. 12/25/22   Barrett Henle, MD  promethazine-dextromethorphan (PROMETHAZINE-DM) 6.25-15 MG/5ML syrup Take 5 mLs by mouth 4 (four) times daily as needed for cough. 05/16/22   Gildardo Pounds, NP    Family History Family History  Problem Relation Age of Onset   Heart attack Father 55   Diabetes Mother    Hypertension Mother    Diabetes Sister    Breast cancer Neg Hx    Colon cancer Neg Hx     Social History Social History   Tobacco Use   Smoking status: Every Day    Packs/day: 0.50    Years: 15.00    Total pack years: 7.50    Types: Cigarettes    Last attempt to quit: 01/18/1996     Years since quitting: 26.9   Smokeless tobacco: Never   Tobacco comments:    1/2 pack per day  Vaping Use   Vaping Use: Never used  Substance Use Topics   Alcohol use: No   Drug use: No     Allergies   Codeine sulfate, Meperidine and related, Minocycline hcl, Penicillins, Propoxyphene n-acetaminophen, and Tetracycline hcl   Review of Systems Review of Systems  Respiratory:  Positive for shortness of breath.   Per HPI   Physical Exam Triage Vital Signs ED Triage Vitals  Enc Vitals Group     BP 12/23/22 1836 135/66     Pulse Rate 12/23/22 1836 92     Resp 12/23/22 1836 16     Temp 12/23/22 1836 98.2 F (36.8 C)     Temp Source 12/23/22 1836 Oral     SpO2 12/23/22 1836 96 %     Weight 12/23/22 1835 180 lb (81.6 kg)     Height 12/23/22 1835 5' 1.5" (1.562 m)     Head Circumference --      Peak Flow --      Pain Score 12/23/22 1833 7     Pain Loc --      Pain Edu? --      Excl. in [place]? --    No data found.  Updated Vital Signs BP 135/66 (BP Location: Right Arm)   Pulse 92   Temp 98.2 F (36.8 C) (Oral)   Resp 16   Ht 5' 1.5" (1.562 m)   Wt 180 lb (81.6 kg)   LMP 01/17/2009   SpO2 96%   BMI 33.46 kg/m   Visual Acuity Right Eye Distance:   Left Eye Distance:   Bilateral Distance:    Right Eye Near:   Left Eye Near:    Bilateral Near:     Physical Exam Vitals and nursing note reviewed.  Constitutional:      Appearance: She is not ill-appearing or toxic-appearing.  HENT:     Head: Normocephalic and atraumatic.     Right Ear: Hearing, tympanic membrane, ear canal and external ear normal.     Left Ear: Hearing, tympanic membrane, ear canal and external ear normal.     Nose: Nose normal.     Mouth/Throat:     Lips: Pink.     Mouth: Mucous membranes are  moist.     Pharynx: No pharyngeal swelling or oropharyngeal exudate.  Eyes:     General: Lids are normal. Vision grossly intact. Gaze aligned appropriately.     Extraocular Movements: Extraocular  movements intact.     Conjunctiva/sclera: Conjunctivae normal.     Pupils: Pupils are equal, round, and reactive to light.  Cardiovascular:     Rate and Rhythm: Normal rate and regular rhythm.     Heart sounds: Normal heart sounds, S1 normal and S2 normal.  Pulmonary:     Effort: Pulmonary effort is normal. No respiratory distress.     Breath sounds: Normal air entry. No stridor. Examination of the right-upper field reveals wheezing. Examination of the left-upper field reveals wheezing. Examination of the right-middle field reveals wheezing. Examination of the left-middle field reveals wheezing. Examination of the right-lower field reveals wheezing. Examination of the left-lower field reveals wheezing. Decreased breath sounds and wheezing present. No rhonchi or rales.     Comments: Diffuse inspiratory and expiratory wheezing with decreased breath sounds to all lung fields. No respiratory distress. Prolonged expiration present. Speaking in full sentences without difficulty.  Musculoskeletal:     Cervical back: Normal range of motion and neck supple.     Right lower leg: No tenderness. No edema.     Left lower leg: No tenderness. No edema.  Lymphadenopathy:     Cervical: Cervical adenopathy present.  Skin:    General: Skin is warm and dry.     Capillary Refill: Capillary refill takes less than 2 seconds.     Findings: No rash.  Neurological:     General: No focal deficit present.     Mental Status: She is alert and oriented to person, place, and time. Mental status is at baseline.     Cranial Nerves: No dysarthria or facial asymmetry.  Psychiatric:        Mood and Affect: Mood normal.        Speech: Speech normal.        Behavior: Behavior normal.        Thought Content: Thought content normal.        Judgment: Judgment normal.      UC Treatments / Results  Labs (all labs ordered are listed, but only abnormal results are displayed) Labs Reviewed  SARS CORONAVIRUS 2 (TAT 6-24 HRS)     EKG   Radiology No results found.  Procedures Procedures (including critical care time)  Medications Ordered in UC Medications  ipratropium-albuterol (DUONEB) 0.5-2.5 (3) MG/3ML nebulizer solution 3 mL (3 mLs Nebulization Given 12/23/22 1910)  methylPREDNISolone sodium succinate (SOLU-MEDROL) 125 mg/2 mL injection 80 mg (80 mg Intramuscular Given 12/23/22 1908)    Initial Impression / Assessment and Plan / UC Course  I have reviewed the triage vital signs and the nursing notes.  Pertinent labs & imaging results that were available during my care of the patient were reviewed by me and considered in my medical decision making (see chart for details).  Viral URI with cough, COPD exacerbation Presentation is consistent with acute viral URI that likely triggered COPD/asthma exacerbation. Deferred imaging based on stable cardiopulmonary exam and hemodynamically stable vital signs after duoneb. Patient is nontoxic in appearance and is oxygenating well on room air at 96% prior to duoneb.  Given duo neb and solumedrol 80mg  IM in clinic for cough, wheeze, and shortness of breath. Significant improvement in lung sounds and subjective shortness of breath reported after duo neb breathing treatment. May continue use of albuterol  inhaler at home as needed. Azithromycin sent for COPD exacerbation bacterial coverage. Oral steroid burst and tessalon perles sent to pharmacy for symptomatic relief to be taken as prescribed. No NSAIDs while taking steroid. Advised to push fluids to stay well hydrated.  She is to start steroid burst tomorrow due to steroid injection in clinic.   Discussed physical exam and available lab work findings in clinic with patient.  Counseled patient regarding appropriate use of medications and potential side effects for all medications recommended or prescribed today. Discussed red flag signs and symptoms of worsening condition,when to call the PCP office, return to urgent care, and  when to seek higher level of care in the emergency department. Patient verbalizes understanding and agreement with plan. All questions answered. Patient discharged in stable condition.    Final Clinical Impressions(s) / UC Diagnoses   Final diagnoses:  Viral URI with cough  COPD exacerbation ([place])  Current smoker     Discharge Instructions      You have a viral upper respiratory infection.  COVID-19 testing is pending. We will call you with results if positive. If your COVID test is positive, you must stay at home until day 6 of symptoms. On day 6, you may go out into public and go back to work, but you must wear a mask until day 11 of symptoms to prevent spread to others.  Use the following medicines to help with symptoms: - Cetirizine 10mg  once daily to help with nasal congestion/dry up mucous. - Tylenol 1,000mg  every 6 hours as needed for fever. - Tessalon perles every 8 hours as needed for cough. - Take steroid as directed (prednisone) 40mg  once daily for the next 5 days with breakfast. Do not take any NSAIDs when taking steroid (ibuprofen/naproxen). - Take azithromycin antibiotic as prescribed.  1 tablespoon of honey in warm water and/or salt water gargles may also help with symptoms. Humidifier to your room will help add water to the air and reduce coughing.  If you develop any new or worsening symptoms, please return.  If your symptoms are severe, please go to the emergency room.  Follow-up with your primary care provider for further evaluation and management of your symptoms as well as ongoing wellness visits.  I hope you feel better!     ED Prescriptions     Medication Sig Dispense Auth. Provider   predniSONE (DELTASONE) 20 MG tablet Take 2 tablets (40 mg total) by mouth daily for 5 days. 10 tablet Talbot Grumbling, FNP   albuterol (VENTOLIN HFA) 108 (90 Base) MCG/ACT inhaler Inhale 1-2 puffs into the lungs every 6 (six) hours as needed for wheezing or shortness of  breath. 18 g Joella Prince M, FNP   azithromycin (ZITHROMAX) 250 MG tablet Take 1 tablet (250 mg total) by mouth daily. Take first 2 tablets together, then 1 every day until finished. 6 tablet Talbot Grumbling, FNP      PDMP not reviewed this encounter.   Talbot Grumbling, [place] 12/26/22 2049

## 2022-12-30 ENCOUNTER — Ambulatory Visit (HOSPITAL_COMMUNITY)
Admission: EM | Admit: 2022-12-30 | Discharge: 2022-12-30 | Disposition: A | Discharge status: Home / Self Care | Payer: Medicare HMO | Attending: Physician Assistant | Admitting: Physician Assistant

## 2022-12-30 ENCOUNTER — Encounter (HOSPITAL_COMMUNITY): Payer: Self-pay | Admitting: *Deleted

## 2022-12-30 ENCOUNTER — Ambulatory Visit (INDEPENDENT_AMBULATORY_CARE_PROVIDER_SITE_OTHER): Payer: Medicare HMO

## 2022-12-30 DIAGNOSIS — R0602 Shortness of breath: Secondary | ICD-10-CM

## 2022-12-30 DIAGNOSIS — R059 Cough, unspecified: Secondary | ICD-10-CM | POA: Diagnosis not present

## 2022-12-30 DIAGNOSIS — J441 Chronic obstructive pulmonary disease with (acute) exacerbation: Secondary | ICD-10-CM | POA: Diagnosis not present

## 2022-12-30 DIAGNOSIS — Z87891 Personal history of nicotine dependence: Secondary | ICD-10-CM

## 2022-12-30 MED ORDER — IPRATROPIUM-ALBUTEROL 0.5-2.5 (3) MG/3ML IN SOLN
3.0000 mL | Freq: Once | RESPIRATORY_TRACT | Status: AC
  Administered 2022-12-30: 3 mL via RESPIRATORY_TRACT

## 2022-12-30 MED ORDER — SPIRIVA RESPIMAT 2.5 MCG/ACT IN AERS: 2.0000 | INHALATION_SPRAY | Freq: Every day | RESPIRATORY_TRACT | 0 refills | Status: AC

## 2022-12-30 MED ORDER — CORICIDIN HBP CONGESTION/COUGH 10-200 MG PO CAPS: 1.0000 | ORAL_CAPSULE | Freq: Four times a day (QID) | ORAL | 0 refills | Status: DC | PRN

## 2022-12-30 MED ORDER — IPRATROPIUM-ALBUTEROL 0.5-2.5 (3) MG/3ML IN SOLN
RESPIRATORY_TRACT | Status: AC
  Filled 2022-12-30: qty 3

## 2022-12-30 MED ORDER — BUDESONIDE-FORMOTEROL FUMARATE 160-4.5 MCG/ACT IN AERO: 2.0000 | INHALATION_SPRAY | Freq: Two times a day (BID) | RESPIRATORY_TRACT | 0 refills | Status: DC

## 2022-12-30 MED ORDER — DEXAMETHASONE SODIUM PHOSPHATE 10 MG/ML IJ SOLN
10.0000 mg | Freq: Once | INTRAMUSCULAR | Status: AC
  Administered 2022-12-30: 10 mg via INTRAMUSCULAR

## 2022-12-30 MED ORDER — DEXAMETHASONE SODIUM PHOSPHATE 10 MG/ML IJ SOLN
INTRAMUSCULAR | Status: AC
  Filled 2022-12-30: qty 1

## 2022-12-30 NOTE — Discharge Instructions (Signed)
Very good to meet you today.  I am glad that you are feeling better.  Your chest x-ray was normal.  No signs of pneumonia.  Please use the Symbicort and Spiriva inhalers daily to help reduce COPD flareups.  May also use your albuterol rescue inhaler every 4-6 hours as needed to help with acute symptoms.  Take the Coricidin HBP to help with mucus relief.  Be sure to keep well-hydrated as this will help thin out the mucus.  You can also use a humidifier in your room.  Follow-up with your primary care provider about smoking cessation.  Please present to the emergency department if acute, severe symptoms.

## 2022-12-30 NOTE — ED Triage Notes (Signed)
Pt seen last SAT. For same. Pt has finished all meds she was prescribed . Pt reports she continues to have cough.

## 2022-12-30 NOTE — ED Provider Notes (Signed)
[place]   MRN: [id number] DOB: [birth year]  Subjective:   Mary Gray is a 62 y.o. female presenting for 2 weeks of cough, wheezing, congestion, slight shortness of breath.  She was previously seen at the urgent care on 12/23/2022 and treated for COPD exacerbation.  She states that she was starting to feel better, but a few days ago started having cough worsening again.  She has been using her rescue albuterol inhaler every 4-6 hours.  This seems to help, but the cough continues to be productive and persistent.  She does not use supplemental oxygen at home.  She is an everyday smoker.  She denies any fever or chills.  No chest pain.  No fatigue or dizziness.  No other symptoms to report today.  No current facility-administered medications for this encounter.  Current Outpatient Medications:    budesonide-formoterol (SYMBICORT) 160-4.5 MCG/ACT inhaler, Inhale 2 puffs into the lungs in the morning and at bedtime. Rinse mouth after use., Disp: 1 each, Rfl: 0   Tiotropium Bromide Monohydrate (SPIRIVA RESPIMAT) 2.5 MCG/ACT AERS, Inhale 2 puffs into the lungs daily., Disp: 4 g, Rfl: 0   Accu-Chek Softclix Lancets lancets, Check blood sugar 1 time per day, Disp: 100 each, Rfl: 3   acetaminophen (TYLENOL) 500 MG tablet, Take 1,000 mg by mouth every 6 (six) hours as needed for mild pain., Disp: , Rfl:    albuterol (VENTOLIN HFA) 108 (90 Base) MCG/ACT inhaler, Inhale 1-2 puffs into the lungs every 6 (six) hours as needed for wheezing or shortness of breath., Disp: 18 g, Rfl: 0   amitriptyline (ELAVIL) 25 MG tablet, Take 1 tablet (25 mg total) by mouth at bedtime., Disp: 90 tablet, Rfl: 3   amLODipine (NORVASC) 5 MG tablet, Take 1 tablet (5 mg total) by mouth daily., Disp: 90 tablet, Rfl: 1   aspirin EC 81 MG tablet, Take 81 mg by mouth daily. Swallow whole., Disp: , Rfl:    azithromycin (ZITHROMAX) 250 MG tablet, Take 1 tablet (250 mg total) by mouth daily. Take first 2 tablets  together, then 1 every day until finished., Disp: 6 tablet, Rfl: 0   benzonatate (TESSALON) 100 MG capsule, Take 1 capsule (100 mg total) by mouth 3 (three) times daily as needed for cough., Disp: 21 capsule, Rfl: 0   CVS VITAMIN B12 1000 MCG tablet, TAKE 1 TABLET BY MOUTH EVERY DAY (Patient taking differently: Take 1,000 mcg by mouth daily.), Disp: 90 tablet, Rfl: 3   cyclobenzaprine (FLEXERIL) 5 MG tablet, Take 1 tablet (5 mg total) by mouth at bedtime., Disp: 30 tablet, Rfl: 1   enalapril (VASOTEC) 20 MG tablet, Take 1 tablet (20 mg total) by mouth daily., Disp: 30 tablet, Rfl: 5   esomeprazole (NEXIUM) 40 MG capsule, TAKE 1 CAPSULE (40 MG TOTAL) BY MOUTH DAILY., Disp: 90 capsule, Rfl: 1   furosemide (LASIX) 40 MG tablet, Take 1 tablet (40 mg total) by mouth daily., Disp: 90 tablet, Rfl: 1   gabapentin (NEURONTIN) 300 MG capsule, Take 1 capsule (300 mg total) by mouth 3 (three) times daily., Disp: 90 capsule, Rfl: 3   glucose blood (ACCU-CHEK GUIDE) test strip, CHECK BLOOD SUGAR 1 TIME PER DAY, Disp: 100 strip, Rfl: 3   HYDROcodone-acetaminophen (NORCO/VICODIN) 5-325 MG tablet, Take 1 tablet by mouth every 4 (four) hours as needed for pain., Disp: , Rfl:    hydrOXYzine (ATARAX) 25 MG tablet, TAKE 1 TABLET BY MOUTH 3 TIMES DAILY. FOR ANXIETY, Disp: 270 tablet,  Rfl: 1   ibuprofen (ADVIL) 200 MG tablet, Take 400 mg by mouth every 6 (six) hours as needed for fever, headache or mild pain., Disp: , Rfl:    levothyroxine (SYNTHROID) 88 MCG tablet, Take 1 tablet (88 mcg total) by mouth daily before breakfast., Disp: 90 tablet, Rfl: 2   metFORMIN (GLUCOPHAGE-XR) 500 MG 24 hr tablet, Take 2 tablets (1,000 mg total) by mouth 2 (two) times daily., Disp: 360 tablet, Rfl: 1   multivitamin-iron-minerals-folic acid (CENTRUM) chewable tablet, Chew 1 tablet by mouth daily., Disp: 90 tablet, Rfl: 3   naloxegol oxalate (MOVANTIK) 25 MG TABS tablet, Take 1 tablet (25 mg total) by mouth daily., Disp: 30 tablet, Rfl: 1    nystatin (NYSTATIN) powder, Apply 1 application topically 3 (three) times daily., Disp: 15 g, Rfl: 0   ondansetron (ZOFRAN) 4 MG tablet, TAKE 1 TABLET BY MOUTH EVERY 8 HOURS AS NEEDED FOR NAUSEA AND VOMITING, Disp: 30 tablet, Rfl: 0   PREMARIN vaginal cream, PLACE 1 APPLICATORFUL VAGINALLY DAILY. (Patient taking differently: 1 applicator 2 (two) times a week.), Disp: 30 g, Rfl: 1   promethazine-dextromethorphan (PROMETHAZINE-DM) 6.25-15 MG/5ML syrup, Take 5 mLs by mouth 4 (four) times daily as needed for cough., Disp: 240 mL, Rfl: 0   rosuvastatin (CRESTOR) 20 MG tablet, Take 1 tablet (20 mg total) by mouth daily., Disp: 90 tablet, Rfl: 3   zolpidem (AMBIEN) 5 MG tablet, TAKE 1 TABLET BY MOUTH AT BEDTIME AS NEEDED FOR SLEEP, Disp: 30 tablet, Rfl: 1   Allergies  Allergen Reactions   Codeine Sulfate Other (See Comments)    Confusion, anxiety, fatigue   Meperidine And Related Nausea And Vomiting   Minocycline Hcl Nausea And Vomiting    Hospitalized for vomiting along with allergic reaction   Penicillins Other (See Comments)    Has patient had a PCN reaction causing immediate rash, facial/tongue/throat swelling, SOB or lightheadedness with hypotension: Yes Has patient had a PCN reaction causing severe rash involving mucus membranes or skin necrosis: Yes Has patient had a PCN reaction that required hospitalization Yes Has patient had a PCN reaction occurring within the last 10 years: Yes If all of the above answers are "NO", then may proceed with Cephalosporin use.    Propoxyphene N-Acetaminophen Other (See Comments)    confused   Tetracycline Hcl Other (See Comments)    Vomiting Note: Patient took a course of doxycycline in June 2014 without any problems.    Past Medical History:  Diagnosis Date   Asthma    Back pain    Chronic interstitial cystitis    Followed by Dr. Franchot Gallo   COPD (chronic obstructive pulmonary disease) ([place])    DDD (degenerative disc disease), lumbar     Degeneration of cervical intervertebral disc    Depression    Diabetes mellitus, type 2 ([place]) 2007   Heme positive stool    EGD on 04/08/2010 by Dr. Watt Climes showed chronic gastritis and a few gastric polyps; pathology showed a fundic gland polyp.  Colonoscopy on 04/08/2010 showed small external and internal hemorrhoids, and a few benign-appearing diminutive polyps in the rectum, the distal sigmoid colon, and in the distal descending colon; pathology showed hyperplastic polyps.    Hypertension    Hypothyroidism    Iron deficiency anemia    Spinal stenosis      Past Surgical History:  Procedure Laterality Date   CARPAL TUNNEL RELEASE  05/08/2000   By Dr. Youlanda Mighty. Sypher, Brooke Bonito.    Family History  Problem Relation Age of Onset   Heart attack Father 11   Diabetes Mother    Hypertension Mother    Diabetes Sister    Breast cancer Neg Hx    Colon cancer Neg Hx     Social History   Tobacco Use   Smoking status: Every Day    Packs/day: 0.50    Years: 15.00    Total pack years: 7.50    Types: Cigarettes    Last attempt to quit: 01/18/1996    Years since quitting: 26.9   Smokeless tobacco: Never   Tobacco comments:    1/2 pack per day  Vaping Use   Vaping Use: Never used  Substance Use Topics   Alcohol use: No   Drug use: No    ROS REFER TO HPI FOR PERTINENT POSITIVES AND NEGATIVES   Objective:   Vitals: BP 123/74   Pulse 92   Temp 98.1 F (36.7 C)   Resp 20   LMP 01/17/2009   SpO2 93%   Physical Exam Vitals and nursing note reviewed.  Constitutional:      General: She is not in acute distress.    Appearance: Normal appearance. She is not ill-appearing.  HENT:     Head: Normocephalic.     Right Ear: Tympanic membrane, ear canal and external ear normal.     Left Ear: Tympanic membrane, ear canal and external ear normal.     Nose: Congestion present.     Mouth/Throat:     Mouth: Mucous membranes are moist.     Pharynx: No oropharyngeal exudate or posterior  oropharyngeal erythema.  Eyes:     Extraocular Movements: Extraocular movements intact.     Conjunctiva/sclera: Conjunctivae normal.     Pupils: Pupils are equal, round, and reactive to light.  Cardiovascular:     Rate and Rhythm: Normal rate and regular rhythm.     Pulses: Normal pulses.     Heart sounds: Normal heart sounds. No murmur heard. Pulmonary:     Effort: Pulmonary effort is normal. No respiratory distress.     Breath sounds: Wheezing and rhonchi present.     Comments: Diffuse rhonchi and wheezing all lung fields; normal respiratory rate; speaking clearly and fully without pauses or gasps Musculoskeletal:     Cervical back: Normal range of motion.  Skin:    General: Skin is warm.  Neurological:     Mental Status: She is alert and oriented to person, place, and time.  Psychiatric:        Mood and Affect: Mood normal.        Behavior: Behavior normal.     No results found. However, due to the size of the patient record, not all encounters were searched. Please check Results Review for a complete set of results.  Assessment and Plan :   PDMP not reviewed this encounter.  1. Acute exacerbation of chronic obstructive pulmonary disease (COPD) ([place])   2. Smoking history    62 year old female presents with ongoing COPD exacerbation.  No acute distress, vital signs are stable.  Chest x-ray was completed, I personally reviewed results, this was negative for any acute processes.  Patient was given DuoNeb treatment and Decadron injection.  On recheck, patient was feeling much better, lungs were clear to auscultation bilaterally. She was discharged in stable condition with prescriptions for Spiriva and Symbicort to start on daily. She will continue to use her rescue albuterol inhaler every 4-6 hours for acute symptoms. She will use Coricidin  HBP to thin mucous. She will keep hydrated and f/up with her PCP. ED precautions advised. Pt agreeable and understanding.     AllwardtRanda Evens, PA-C 12/30/22 1452

## 2022-12-31 ENCOUNTER — Other Ambulatory Visit: Payer: Self-pay | Admitting: Physician Assistant

## 2022-12-31 DIAGNOSIS — M5136 Other intervertebral disc degeneration, lumbar region: Secondary | ICD-10-CM

## 2022-12-31 DIAGNOSIS — M47816 Spondylosis without myelopathy or radiculopathy, lumbar region: Secondary | ICD-10-CM

## 2022-12-31 DIAGNOSIS — G2581 Restless legs syndrome: Secondary | ICD-10-CM

## 2022-12-31 DIAGNOSIS — G894 Chronic pain syndrome: Secondary | ICD-10-CM

## 2023-01-01 ENCOUNTER — Other Ambulatory Visit: Payer: Self-pay | Admitting: Family Medicine

## 2023-01-01 DIAGNOSIS — R110 Nausea: Secondary | ICD-10-CM

## 2023-01-02 NOTE — Telephone Encounter (Signed)
Requested medication (s) are due for refill today: yes  Requested medication (s) are on the active medication list: yes  Last refill:  12/20/22  Future visit scheduled: yes  Notes to clinic:  Unable to refill per protocol, cannot delegate.      Requested Prescriptions  Pending Prescriptions Disp Refills   ondansetron (ZOFRAN) 4 MG tablet [Pharmacy Med Name: ONDANSETRON HCL 4 MG TABLET] 30 tablet 0    Sig: TAKE 1 TABLET BY MOUTH EVERY 8 HOURS AS NEEDED FOR NAUSEA AND VOMITING     Not Delegated - Gastroenterology: Antiemetics - ondansetron Failed - 01/01/2023  5:28 PM      Failed - This refill cannot be delegated      Passed - AST in normal range and within 360 days    AST  Date Value Ref Range Status  04/19/2022 25 0 - 40 IU/L Final         Passed - ALT in normal range and within 360 days    ALT  Date Value Ref Range Status  04/19/2022 17 0 - 32 IU/L Final         Passed - Valid encounter within last 6 months    Recent Outpatient Visits           3 months ago Chronic constipation   [place], [place]   7 months ago [place], NP   8 months ago Encounter to establish care   [place], NP       Future Appointments             Tomorrow Gildardo Pounds, NP [place]

## 2023-01-03 ENCOUNTER — Encounter: Payer: Self-pay | Admitting: Nurse Practitioner

## 2023-01-03 ENCOUNTER — Ambulatory Visit: Payer: Medicare HMO | Attending: Nurse Practitioner | Admitting: Nurse Practitioner

## 2023-01-03 VITALS — BP 106/68 | HR 103 | Ht 61.5 in | Wt 180.6 lb

## 2023-01-03 DIAGNOSIS — K5909 Other constipation: Secondary | ICD-10-CM

## 2023-01-03 DIAGNOSIS — R110 Nausea: Secondary | ICD-10-CM

## 2023-01-03 DIAGNOSIS — Z720 Tobacco use: Secondary | ICD-10-CM

## 2023-01-03 DIAGNOSIS — J441 Chronic obstructive pulmonary disease with (acute) exacerbation: Secondary | ICD-10-CM | POA: Diagnosis not present

## 2023-01-03 DIAGNOSIS — E1165 Type 2 diabetes mellitus with hyperglycemia: Secondary | ICD-10-CM

## 2023-01-03 DIAGNOSIS — Z23 Encounter for immunization: Secondary | ICD-10-CM | POA: Diagnosis not present

## 2023-01-03 DIAGNOSIS — I10 Essential (primary) hypertension: Secondary | ICD-10-CM

## 2023-01-03 DIAGNOSIS — E034 Atrophy of thyroid (acquired): Secondary | ICD-10-CM | POA: Diagnosis not present

## 2023-01-03 DIAGNOSIS — Z1211 Encounter for screening for malignant neoplasm of colon: Secondary | ICD-10-CM | POA: Diagnosis not present

## 2023-01-03 MED ORDER — ZOSTER VAC RECOMB ADJUVANTED 50 MCG/0.5ML IM SUSR: 0.5000 mL | Freq: Once | INTRAMUSCULAR | 0 refills | Status: AC

## 2023-01-03 MED ORDER — LEVOTHYROXINE SODIUM 88 MCG PO TABS: 88.0000 ug | ORAL_TABLET | Freq: Every day | ORAL | 2 refills | Status: DC

## 2023-01-03 MED ORDER — ONDANSETRON HCL 4 MG PO TABS: 4.0000 mg | ORAL_TABLET | Freq: Three times a day (TID) | ORAL | 1 refills | Status: DC | PRN

## 2023-01-03 MED ORDER — ALBUTEROL SULFATE (2.5 MG/3ML) 0.083% IN NEBU: 2.5000 mg | INHALATION_SOLUTION | Freq: Four times a day (QID) | RESPIRATORY_TRACT | 1 refills | Status: DC | PRN

## 2023-01-03 MED ORDER — PREDNISONE 10 MG PO TABS: ORAL_TABLET | ORAL | 0 refills | Status: DC

## 2023-01-03 MED ORDER — NYSTATIN 100000 UNIT/GM EX CREA: 1.0000 | TOPICAL_CREAM | Freq: Two times a day (BID) | CUTANEOUS | 3 refills | Status: DC

## 2023-01-03 MED ORDER — NALOXEGOL OXALATE 25 MG PO TABS: 25.0000 mg | ORAL_TABLET | Freq: Every day | ORAL | 1 refills | Status: DC

## 2023-01-03 NOTE — Progress Notes (Signed)
On going cough and headache.

## 2023-01-03 NOTE — Progress Notes (Signed)
Assessment & Plan:  Judson Roch was seen today for hypertension and headache.  Diagnoses and all orders for this visit:  Primary hypertension Continue all antihypertensives as prescribed.  Reminded to bring in blood pressure log for follow  up appointment.  RECOMMENDATIONS: DASH/Mediterranean Diets are healthier choices for HTN.    Need for shingles vaccine -     Zoster Vaccine Adjuvanted [facility]) injection; Inject 0.5 mLs into the muscle once for 1 dose.  Need for Tdap vaccination -     Tdap vaccine greater than or equal to 7yo IM  COPD exacerbation (HCC) -     predniSONE (DELTASONE) 10 MG tablet; Directions for 6 day taper: Day 1: 2 tablets before breakfast, 1 after both lunch & dinner and 2 at bedtime Day 2: 1 tab before breakfast, 1 after both lunch & dinner and 2 at bedtime Day 3: 1 tab at each meal & 1 at bedtime Day 4: 1 tab at breakfast, 1 at lunch, 1 at bedtime Day 5: 1 tab at breakfast & 1 tab at bedtime Day 6: 1 tab at breakfast -     For home use only DME Nebulizer machine -     albuterol (PROVENTIL) (2.5 MG/3ML) 0.083% nebulizer solution; Take 3 mLs (2.5 mg total) by nebulization every 6 (six) hours as needed for wheezing or shortness of breath.  Chronic constipation -     naloxegol oxalate (MOVANTIK) 25 MG TABS tablet; Take 1 tablet (25 mg total) by mouth daily.  Hypothyroidism due to acquired atrophy of thyroid -     levothyroxine (SYNTHROID) 88 MCG tablet; Take 1 tablet (88 mcg total) by mouth daily before breakfast. -     CMP14+EGFR -     Thyroid Panel With TSH   Nausea -     ondansetron (ZOFRAN) 4 MG tablet; Take 1 tablet (4 mg total) by mouth every 8 (eight) hours as needed for nausea or vomiting.  Uncontrolled type 2 diabetes mellitus with hyperglycemia (HCC) -     Microalbumin / creatinine urine ratio  Colon cancer screening -     Ambulatory referral to Gastroenterology  Other orders -     nystatin cream (MYCOSTATIN); Apply 1 Application topically 2 (two)  times daily.    Patient has been counseled on age-appropriate routine health concerns for screening and prevention. These are reviewed and up-to-date. Referrals have been placed accordingly. Immunizations are up-to-date or declined.    Subjective:   Chief Complaint  Patient presents with   Hypertension   Headache   HPI Mary Gray 62 y.o. female presents to office today for follow up to HTN and with persistent cough  She has a past medical history of Back pain, Chronic interstitial cystitis, COPD, DDD lumbar, Degeneration of cervical intervertebral disc, Depression, Diabetes mellitus, type 2 (2007), Heme positive stool, Hypertension, Hypothyroidism, Iron deficiency anemia, and Spinal stenosis Tobacco Dependence    Patient has been counseled on age-appropriate routine health concerns for screening and prevention. These are reviewed and up-to-date. Referrals have been placed accordingly. Immunizations are up-to-date or declined.   MAMMOGRAM: OVER DUE. Referral placed PAP Smear: OVERDUE. To be scheduled Colonoscopy: OVERDUE. Referral placed.   Requesting refill on premarin. I have deferred her to her gynecologist for this.     HTN Blood pressure is well controlled with amlodipine 5 mg daily, enalapril 20 mg daily and lasix 40 mg daily for BLE edema.  BP Readings from Last 3 Encounters:  01/03/23 106/68  12/30/22 123/74  12/23/22 135/66    .  Tobacco use disorder Patient with significant tobacco use since the age of 82. Currently down to half a pack per day but has not smoked since she started feeling sick on February 28th.   COPD She has been a smoker for over 40 years.  Complains of dyspnea, cough, and increased sputum. Symptoms began several weeks ago. Symptoms include chronic dyspnea and cough productive of yellow and mucoid sputum in small amounts which does not worsen with exertion.  Fever has been  absent .Patient currently is not on home oxygen therapy.Marland Kitchen Respiratory  history: COPD  She recently completed a course of steroids and zpak but has not noticed much improvement in reduction of sputum production. States she is having difficulty expectorating the mucous. Needs a nebulizer. She has never owned one and is using her albuterol inhaler a few times per day along with using her symbicort daily as prescribed.    Review of Systems  Constitutional:  Negative for fever, malaise/fatigue and weight loss.  HENT: Negative.  Negative for nosebleeds.   Eyes: Negative.  Negative for blurred vision, double vision and photophobia.  Respiratory:  Positive for cough, sputum production, shortness of breath and wheezing.   Cardiovascular: Negative.  Negative for chest pain, palpitations and leg swelling.  Gastrointestinal:  Positive for constipation (chronic) and nausea. Negative for abdominal pain, blood in stool, diarrhea, heartburn, melena and vomiting.  Musculoskeletal: Negative.  Negative for myalgias.  Skin:  Positive for itching and rash (chronic rash under breasts).  Neurological: Negative.  Negative for dizziness, focal weakness, seizures and headaches.  Psychiatric/Behavioral: Negative.  Negative for suicidal ideas.     Past Medical History:  Diagnosis Date   Asthma    Back pain    Chronic interstitial cystitis    Followed by Dr. Franchot Gallo   COPD (chronic obstructive pulmonary disease) ([place])    DDD (degenerative disc disease), lumbar    Degeneration of cervical intervertebral disc    Depression    Diabetes mellitus, type 2 ([place]) 2007   Heme positive stool    EGD on 04/08/2010 by Dr. Watt Climes showed chronic gastritis and a few gastric polyps; pathology showed a fundic gland polyp.  Colonoscopy on 04/08/2010 showed small external and internal hemorrhoids, and a few benign-appearing diminutive polyps in the rectum, the distal sigmoid colon, and in the distal descending colon; pathology showed hyperplastic polyps.    Hypertension    Hypothyroidism     Iron deficiency anemia    Spinal stenosis     Past Surgical History:  Procedure Laterality Date   CARPAL TUNNEL RELEASE  05/08/2000   By Dr. Youlanda Mighty. Sypher, Brooke Bonito.    Family History  Problem Relation Age of Onset   Heart attack Father 80   Diabetes Mother    Hypertension Mother    Diabetes Sister    Breast cancer Neg Hx    Colon cancer Neg Hx     Social History Reviewed with no changes to be made today.   Outpatient Medications Prior to Visit  Medication Sig Dispense Refill   Accu-Chek Softclix Lancets lancets Check blood sugar 1 time per day 100 each 3   acetaminophen (TYLENOL) 500 MG tablet Take 1,000 mg by mouth every 6 (six) hours as needed for mild pain.     albuterol (VENTOLIN HFA) 108 (90 Base) MCG/ACT inhaler Inhale 1-2 puffs into the lungs every 6 (six) hours as needed for wheezing or shortness of breath. 18 g 0   amitriptyline (ELAVIL) 25 MG tablet Take  1 tablet (25 mg total) by mouth at bedtime. 90 tablet 3   amLODipine (NORVASC) 5 MG tablet Take 1 tablet (5 mg total) by mouth daily. 90 tablet 1   aspirin EC 81 MG tablet Take 81 mg by mouth daily. Swallow whole.     azithromycin (ZITHROMAX) 250 MG tablet Take 1 tablet (250 mg total) by mouth daily. Take first 2 tablets together, then 1 every day until finished. 6 tablet 0   benzonatate (TESSALON) 100 MG capsule Take 1 capsule (100 mg total) by mouth 3 (three) times daily as needed for cough. 21 capsule 0   budesonide-formoterol (SYMBICORT) 160-4.5 MCG/ACT inhaler Inhale 2 puffs into the lungs in the morning and at bedtime. Rinse mouth after use. 1 each 0   CVS VITAMIN B12 1000 MCG tablet TAKE 1 TABLET BY MOUTH EVERY DAY (Patient taking differently: Take 1,000 mcg by mouth daily.) 90 tablet 3   cyclobenzaprine (FLEXERIL) 5 MG tablet TAKE 1 TABLET BY MOUTH EVERYDAY AT BEDTIME 30 tablet 0   Dextromethorphan-guaiFENesin (CORICIDIN HBP CONGESTION/COUGH) 10-200 MG CAPS Take 1 Dose by mouth every 6 (six) hours as needed. 30  capsule 0   enalapril (VASOTEC) 20 MG tablet Take 1 tablet (20 mg total) by mouth daily. 30 tablet 5   esomeprazole (NEXIUM) 40 MG capsule TAKE 1 CAPSULE (40 MG TOTAL) BY MOUTH DAILY. 90 capsule 1   furosemide (LASIX) 40 MG tablet Take 1 tablet (40 mg total) by mouth daily. 90 tablet 1   gabapentin (NEURONTIN) 300 MG capsule Take 1 capsule (300 mg total) by mouth 3 (three) times daily. 90 capsule 3   glucose blood (ACCU-CHEK GUIDE) test strip CHECK BLOOD SUGAR 1 TIME PER DAY 100 strip 3   HYDROcodone-acetaminophen (NORCO) 7.5-325 MG tablet 1 tablet Orally up to 4 times a day as needed for 30 days     HYDROcodone-acetaminophen (NORCO/VICODIN) 5-325 MG tablet Take 1 tablet by mouth every 4 (four) hours as needed for pain.     hydrOXYzine (ATARAX) 25 MG tablet TAKE 1 TABLET BY MOUTH 3 TIMES DAILY. FOR ANXIETY 270 tablet 1   ibuprofen (ADVIL) 200 MG tablet Take 400 mg by mouth every 6 (six) hours as needed for fever, headache or mild pain.     metFORMIN (GLUCOPHAGE-XR) 500 MG 24 hr tablet Take 2 tablets (1,000 mg total) by mouth 2 (two) times daily. 360 tablet 1   multivitamin-iron-minerals-folic acid (CENTRUM) chewable tablet Chew 1 tablet by mouth daily. 90 tablet 3   nystatin (NYSTATIN) powder Apply 1 application topically 3 (three) times daily. 15 g 0   PREMARIN vaginal cream PLACE 1 APPLICATORFUL VAGINALLY DAILY. (Patient taking differently: 1 applicator 2 (two) times a week.) 30 g 1   promethazine-dextromethorphan (PROMETHAZINE-DM) 6.25-15 MG/5ML syrup Take 5 mLs by mouth 4 (four) times daily as needed for cough. 240 mL 0   rosuvastatin (CRESTOR) 20 MG tablet Take 1 tablet (20 mg total) by mouth daily. 90 tablet 3   Tiotropium Bromide Monohydrate (SPIRIVA RESPIMAT) 2.5 MCG/ACT AERS Inhale 2 puffs into the lungs daily. 4 g 0   zolpidem (AMBIEN) 5 MG tablet TAKE 1 TABLET BY MOUTH AT BEDTIME AS NEEDED FOR SLEEP 30 tablet 1   levothyroxine (SYNTHROID) 88 MCG tablet Take 1 tablet (88 mcg total) by  mouth daily before breakfast. 90 tablet 2   naloxegol oxalate (MOVANTIK) 25 MG TABS tablet Take 1 tablet (25 mg total) by mouth daily. 30 tablet 1   ondansetron (ZOFRAN) 4 MG tablet TAKE  1 TABLET BY MOUTH EVERY 8 HOURS AS NEEDED FOR NAUSEA AND VOMITING 30 tablet 0   No facility-administered medications prior to visit.    Allergies  Allergen Reactions   Amoxicillin Other (See Comments)   Codeine Sulfate Other (See Comments)    Confusion, anxiety, fatigue   Meperidine And Related Nausea And Vomiting   Minocycline Hcl Nausea And Vomiting    Hospitalized for vomiting along with allergic reaction   Penicillins Other (See Comments)    Has patient had a PCN reaction causing immediate rash, facial/tongue/throat swelling, SOB or lightheadedness with hypotension: Yes Has patient had a PCN reaction causing severe rash involving mucus membranes or skin necrosis: Yes Has patient had a PCN reaction that required hospitalization Yes Has patient had a PCN reaction occurring within the last 10 years: Yes If all of the above answers are "NO", then may proceed with Cephalosporin use.    Propoxyphene N-Acetaminophen Other (See Comments)    confused   Tetracycline Hcl Other (See Comments)    Vomiting Note: Patient took a course of doxycycline in June 2014 without any problems.       Objective:    BP 106/68   Pulse (!) 103   Ht 5' 1.5" (1.562 m)   Wt 180 lb 9.6 oz (81.9 kg)   LMP 01/17/2009   SpO2 97%   BMI 33.57 kg/m  Wt Readings from Last 3 Encounters:  01/03/23 180 lb 9.6 oz (81.9 kg)  12/23/22 180 lb (81.6 kg)  10/04/22 185 lb 12.8 oz (84.3 kg)    Physical Exam Vitals and nursing note reviewed.  Constitutional:      Appearance: She is well-developed.  HENT:     Head: Normocephalic and atraumatic.  Cardiovascular:     Rate and Rhythm: Regular rhythm. Tachycardia present.     Heart sounds: Normal heart sounds. No murmur heard.    No friction rub. No gallop.  Pulmonary:      Effort: Pulmonary effort is normal. No tachypnea or respiratory distress.     Breath sounds: Normal breath sounds. No decreased breath sounds, wheezing, rhonchi or rales.  Chest:     Chest wall: No tenderness.  Abdominal:     General: Bowel sounds are normal.     Palpations: Abdomen is soft.  Musculoskeletal:        General: Normal range of motion.     Cervical back: Normal range of motion.  Skin:    General: Skin is warm and dry.  Neurological:     Mental Status: She is alert and oriented to person, place, and time.     Coordination: Coordination normal.  Psychiatric:        Behavior: Behavior normal. Behavior is cooperative.        Thought Content: Thought content normal.        Judgment: Judgment normal.          Patient has been counseled extensively about nutrition and exercise as well as the importance of adherence with medications and regular follow-up. The patient was given clear instructions to go to ER or return to medical center if symptoms don't improve, worsen or new problems develop. The patient verbalized understanding.   Follow-up: Return in 4 months (on 04/20/2023).   Gildardo Pounds, FNP-BC [facility] and [facility], [place]   01/03/2023, 8:51 PM

## 2023-01-03 NOTE — Patient Instructions (Signed)
[facility] in: [facility] Address: [address], [place], [place] [address] Open ? Closes 5:30?PM Phone: [phone]

## 2023-01-04 ENCOUNTER — Telehealth: Payer: Self-pay | Admitting: Nurse Practitioner

## 2023-01-04 ENCOUNTER — Ambulatory Visit: Payer: Self-pay

## 2023-01-04 DIAGNOSIS — Z23 Encounter for immunization: Secondary | ICD-10-CM | POA: Diagnosis not present

## 2023-01-04 NOTE — Telephone Encounter (Signed)
  Chief Complaint: leg cramps both legs Symptoms: intermittent cramps that wake her up at night, mild swelling to both lower legs, chronic back pain Frequency: 2 years (pt stated ever since she was taken off potassium supplementation Pertinent Negatives: Patient denies chest pain, SOB, rash, fever, numbness or weakness Disposition: [] ED /[] Urgent Care (no appt availability in office) / [] Appointment(In office/virtual)/ []  [facility]/ [] Home Care/ [] Refused Recommended Disposition /[] [facility] [facility]/ [x]  Follow-up with PCP Additional Notes: pt was seen in office yesterday and forgot to mention to PCP. Last k yesterday 3.7 Reason for Disposition  Leg pain or muscle cramp is a chronic symptom (recurrent or ongoing AND present > 4 weeks)  Answer Assessment - Initial Assessment Questions 1. ONSET: "When did the pain start?"      2 years ago 2. LOCATION: "Where is the pain located?"      Both legs 3. PAIN: "How bad is the pain?"    (Scale 1-10; or mild, moderate, severe)   -  MILD (1-3): doesn't interfere with normal activities    -  MODERATE (4-7): interferes with normal activities (e.g., work or school) or awakens from sleep, limping    -  SEVERE (8-10): excruciating pain, unable to do any normal activities, unable to walk     severe 4. WORK OR EXERCISE: "Has there been any recent work or exercise that involved this part of the body?"      Walking more 5. CAUSE: "What do you think is causing the leg pain?"     Low K? 6. OTHER SYMPTOMS: "Do you have any other symptoms?" (e.g., chest pain, back pain, breathing difficulty, swelling, rash, fever, numbness, weakness)     Back and neck, cough, 7. PREGNANCY: "Is there any chance you are pregnant?" "When was your last menstrual period?"     *No Answer*  Protocols used: Leg Pain-A-AH

## 2023-01-04 NOTE — Telephone Encounter (Signed)
Duplicate message original message has been sent to provider already.

## 2023-01-04 NOTE — Telephone Encounter (Signed)
Patient is calling in following up on a call from earlier about her leg cramps. Patient wants to know can [name] send in something for the pain. Please follow up with patient.

## 2023-01-04 NOTE — Progress Notes (Signed)
Documented

## 2023-01-04 NOTE — Telephone Encounter (Signed)
Message from [employer] sent at 01/04/2023  9:13 AM EST  Summary: rx req / leg cramping   The patient shares that they experience significant cramping in both of their legs  The patient shares that they experience the cramps nightly and occasionally during the day  The patient would like to be prescribed something for their cramps and shares that they have a history of muscular discomfort  Please contact the patient further when possible        Called pt and LM on VM to call back. No appts at [facility].

## 2023-01-04 NOTE — Telephone Encounter (Signed)
FYI

## 2023-01-05 ENCOUNTER — Ambulatory Visit: Payer: Self-pay | Admitting: *Deleted

## 2023-01-05 LAB — CMP14+EGFR
ALT: 28 IU/L (ref 0–32)
AST: 28 IU/L (ref 0–40)
Albumin/Globulin Ratio: 2 (ref 1.2–2.2)
Albumin: 4.7 g/dL (ref 3.9–4.9)
Alkaline Phosphatase: 81 IU/L (ref 44–121)
BUN/Creatinine Ratio: 13 (ref 12–28)
BUN: 11 mg/dL (ref 8–27)
Bilirubin Total: 0.3 mg/dL (ref 0.0–1.2)
CO2: 29 mmol/L (ref 20–29)
Calcium: 9.6 mg/dL (ref 8.7–10.3)
Chloride: 83 mmol/L — ABNORMAL LOW (ref 96–106)
Creatinine, Ser: 0.82 mg/dL (ref 0.57–1.00)
Globulin, Total: 2.4 g/dL (ref 1.5–4.5)
Glucose: 153 mg/dL — ABNORMAL HIGH (ref 70–99)
Potassium: 3.7 mmol/L (ref 3.5–5.2)
Sodium: 133 mmol/L — ABNORMAL LOW (ref 134–144)
Total Protein: 7.1 g/dL (ref 6.0–8.5)
eGFR: 81 mL/min/{1.73_m2} (ref >59)

## 2023-01-05 LAB — THYROID PANEL WITH TSH
Free Thyroxine Index: 2.5 (ref 1.2–4.9)
T3 Uptake Ratio: 28 % (ref 24–39)
T4, Total: 8.9 ug/dL (ref 4.5–12.0)
TSH: 1.27 u[IU]/mL (ref 0.450–4.500)

## 2023-01-05 NOTE — Telephone Encounter (Signed)
Reason for Disposition  Leg pain or muscle cramp is a chronic symptom (recurrent or ongoing AND present > 4 weeks)  Answer Assessment - Initial Assessment Questions 1. ONSET: "When did the pain start?"      Having bad cramps in both legs.  I came to the dr. On Wed.   I mentioned this to her.   I was having URI.   I forgot to speak to her about it.     2. LOCATION: "Where is the pain located?"      Both legs 3. PAIN: "How bad is the pain?"    (Scale 1-10; or mild, moderate, severe)   -  MILD (1-3): doesn't interfere with normal activities    -  MODERATE (4-7): interferes with normal activities (e.g., work or school) or awakens from sleep, limping    -  SEVERE (8-10): excruciating pain, unable to do any normal activities, unable to walk     Severe leg cramps 4. WORK OR EXERCISE: "Has there been any recent work or exercise that involved this part of the body?"      No 5. CAUSE: "What do you think is causing the leg pain?"     I'm having bad leg cramps.    I've had cramps before in my legs. 6. OTHER SYMPTOMS: "Do you have any other symptoms?" (e.g., chest pain, back pain, breathing difficulty, swelling, rash, fever, numbness, weakness)     Hurts bad again in my legs 7. PREGNANCY: "Is there any chance you are pregnant?" "When was your last menstrual period?"     N/A  Protocols used: Leg Pain-A-AH

## 2023-01-05 NOTE — Telephone Encounter (Signed)
  Chief Complaint: cramps in both legs especially at night Symptoms: cramps in legs Frequency: daily/nightly Pertinent Negatives: Patient denies N/A Disposition: []$ ED /[]$ Urgent Care (no appt availability in office) / []$ Appointment(In office/virtual)/ []$  [facility]/ []$ Home Care/ []$ Refused Recommended Disposition /[]$ [place] [facility]/ [x]$  Follow-up with PCP Additional Notes: She was just seen this past Wednesday and forgot to mention this.   Will she be willing to call in something for the cramping in my legs? Message sent Geryl Rankins, NP with her request.

## 2023-01-05 NOTE — Telephone Encounter (Signed)
She has flexeril/cyclobenzaprine she can take at night for leg cramps.

## 2023-01-05 NOTE — Telephone Encounter (Signed)
Patient identified by name and date of birth.   Patient aware of response and voiced understanding.

## 2023-01-08 ENCOUNTER — Telehealth: Payer: Self-pay

## 2023-01-08 NOTE — Telephone Encounter (Signed)
LVM for patient to call and schedule telephone appointment with Angelina Sheriff

## 2023-01-09 ENCOUNTER — Ambulatory Visit: Payer: Self-pay | Admitting: *Deleted

## 2023-01-09 NOTE — Telephone Encounter (Signed)
Thank you :)

## 2023-01-09 NOTE — Telephone Encounter (Signed)
  Chief Complaint: Called in and was given her lab result message from Geryl Rankins, NP Symptoms:  Frequency:  Pertinent Negatives: Patient denies  Disposition: []$ ED /[]$ Urgent Care (no appt availability in office) / []$ Appointment(In office/virtual)/ []$  [facility]/ []$ Home Care/ []$ Refused Recommended Disposition /[]$  [facility]/ [x]$  Follow-up with PCP Additional Notes: Pt asked for an update on the nebulizer machine Geryl Rankins ordered for her during her last OV.  She also verbalized understanding to stop the Lasix and get her blood work done in 2-3 weeks.

## 2023-01-09 NOTE — Telephone Encounter (Signed)
Reason for Disposition  [1] Follow-up call to recent contact AND [2] information only call, no triage required  Answer Assessment - Initial Assessment Questions 1. REASON FOR CALL or QUESTION: "What is your reason for calling today?" or "How can I best help you?" or "What question do you have that I can help answer?"     Pt called in and was given her lab result message.    She verbalized understanding to stop the Lasix and come back in for blood work in 2-3 weeks.  She asked if there was an update on the nebulizer machine [name] was going to order for during her last visit.  Protocols used: Information Only Call - No Triage-A-AH

## 2023-01-09 NOTE — Telephone Encounter (Signed)
Faxed on the 15th of February.

## 2023-01-09 NOTE — Telephone Encounter (Signed)
[facility]

## 2023-01-10 NOTE — Telephone Encounter (Signed)
Call placed to inform patient that the order for nebulizer machine  has been sent to [facility] and someone there should be call her on tomorrow.

## 2023-01-15 ENCOUNTER — Ambulatory Visit: Payer: Self-pay | Admitting: *Deleted

## 2023-01-15 NOTE — Telephone Encounter (Signed)
Noted  

## 2023-01-15 NOTE — Telephone Encounter (Signed)
  Chief Complaint: Leg Swelling Symptoms: Both legs swollen from knee bad "Bad". Cramping at night. States started when told to stop lasix 4-5 days ago "Because my electrolytes were off." Also with cough, congestion Frequency: 4-5 days Pertinent Negatives: Patient denies  Disposition: []$ ED /[]$ Urgent Care (no appt availability in office) / []$ Appointment(In office/virtual)/ []$  [facility]/ []$ Home Care/ []$ Refused Recommended Disposition /[x]$ [facility]/ []$  Follow-up with PCP Additional Notes: No availability, advised [facility], states will follow disposition. Care advise provided, pt verbalizes understanding.  Reason for Disposition  [1] MODERATE leg swelling (e.g., swelling extends up to knees) AND [2] new-onset or worsening  Answer Assessment - Initial Assessment Questions 1. ONSET: "When did the swelling start?" (e.g., minutes, hours, days)     4 days ago 2. LOCATION: "What part of the leg is swollen?"  "Are both legs swollen or just one leg?"     Knee down 3. SEVERITY: "How bad is the swelling?" (e.g., localized; mild, moderate, severe)   - Localized: Small area of swelling localized to one leg.   - MILD pedal edema: Swelling limited to foot and ankle, pitting edema < 1/4 inch (6 mm) deep, rest and elevation eliminate most or all swelling.   - MODERATE edema: Swelling of lower leg to knee, pitting edema > 1/4 inch (6 mm) deep, rest and elevation only partially reduce swelling.   - SEVERE edema: Swelling extends above knee, facial or hand swelling present.      Moderate 4. REDNESS: "Does the swelling look red or infected?"      Little warm 5. PAIN: "Is the swelling painful to touch?" If Yes, ask: "How painful is it?"   (Scale 1-10; mild, moderate or severe)     Cramps at night 6. FEVER: "Do you have a fever?" If Yes, ask: "What is it, how was it measured, and when did it start?"      no 7. CAUSE: "What do you think is causing the leg swelling?"     Off Lasix 8.  MEDICAL HISTORY: "Do you have a history of blood clots (e.g., DVT), cancer, heart failure, kidney disease, or liver failure?"     *No Answer* 9. RECURRENT SYMPTOM: "Have you had leg swelling before?" If Yes, ask: "When was the last time?" "What happened that time?"     *No Answer* 10. OTHER SYMPTOMS: "Do you have any other symptoms?" (e.g., chest pain, difficulty breathing)       Cough  Protocols used: Leg Swelling and Edema-A-AH

## 2023-01-16 ENCOUNTER — Ambulatory Visit: Payer: Medicare HMO | Attending: Nurse Practitioner

## 2023-01-16 DIAGNOSIS — Z0000 Encounter for general adult medical examination without abnormal findings: Secondary | ICD-10-CM | POA: Diagnosis not present

## 2023-01-16 NOTE — Progress Notes (Signed)
Subjective:   Mary Gray is a 62 y.o. female who presents for Medicare Annual (Subsequent) preventive examination.  Review of Systems     I connected with Mary Gray on 01/16/2023 at 3:33 pm by telephone and verified that I am speaking with the correct person using two identifiers. I discussed the limitations, risks, security and privacy concerns of performing an evaluation and management service by telephone and the availability of in person appointments. I also discussed with the patient that there may be a patient responsible charge related to this service. The patient expressed understanding and agreed to proceed.   Patient location: Home My Location: [place] on the telephone call: Myself and Patinet     Cardiac Risk Factors include: none     Objective:    Today's Vitals   01/16/23 1533  PainSc: 8    There is no height or weight on file to calculate BMI.     01/16/2023    3:36 PM 01/21/2022   10:23 AM 01/20/2022    6:42 AM 06/22/2021    9:33 AM 04/21/2021    9:04 PM 04/08/2021   10:38 AM 03/22/2021    9:55 AM  Advanced Directives  Does Patient Have a Medical Advance Directive? No  No No No No No  Would patient like information on creating a medical advance directive? Yes (ED - Information included in AVS) No - Patient declined  No - Patient declined No - Patient declined No - Patient declined Yes (MAU/Ambulatory/Procedural Areas - Information given)    Current Medications (verified) Outpatient Encounter Medications as of 01/16/2023  Medication Sig   Accu-Chek Softclix Lancets lancets Check blood sugar 1 time per day   acetaminophen (TYLENOL) 500 MG tablet Take 1,000 mg by mouth every 6 (six) hours as needed for mild pain.   albuterol (PROVENTIL) (2.5 MG/3ML) 0.083% nebulizer solution Take 3 mLs (2.5 mg total) by nebulization every 6 (six) hours as needed for wheezing or shortness of breath.   albuterol (VENTOLIN HFA) 108 (90 Base) MCG/ACT inhaler  Inhale 1-2 puffs into the lungs every 6 (six) hours as needed for wheezing or shortness of breath.   amitriptyline (ELAVIL) 25 MG tablet Take 1 tablet (25 mg total) by mouth at bedtime.   amLODipine (NORVASC) 5 MG tablet Take 1 tablet (5 mg total) by mouth daily.   aspirin EC 81 MG tablet Take 81 mg by mouth daily. Swallow whole.   azithromycin (ZITHROMAX) 250 MG tablet Take 1 tablet (250 mg total) by mouth daily. Take first 2 tablets together, then 1 every day until finished.   benzonatate (TESSALON) 100 MG capsule Take 1 capsule (100 mg total) by mouth 3 (three) times daily as needed for cough.   budesonide-formoterol (SYMBICORT) 160-4.5 MCG/ACT inhaler Inhale 2 puffs into the lungs in the morning and at bedtime. Rinse mouth after use.   [employer] VITAMIN B12 1000 MCG tablet TAKE 1 TABLET BY MOUTH EVERY DAY (Patient taking differently: Take 1,000 mcg by mouth daily.)   cyclobenzaprine (FLEXERIL) 5 MG tablet TAKE 1 TABLET BY MOUTH EVERYDAY AT BEDTIME   Dextromethorphan-guaiFENesin (CORICIDIN HBP CONGESTION/COUGH) 10-200 MG CAPS Take 1 Dose by mouth every 6 (six) hours as needed.   enalapril (VASOTEC) 20 MG tablet Take 1 tablet (20 mg total) by mouth daily.   esomeprazole (NEXIUM) 40 MG capsule TAKE 1 CAPSULE (40 MG TOTAL) BY MOUTH DAILY.   furosemide (LASIX) 40 MG tablet Take 1 tablet (40 mg total) by mouth  daily.   gabapentin (NEURONTIN) 300 MG capsule Take 1 capsule (300 mg total) by mouth 3 (three) times daily.   glucose blood (ACCU-CHEK GUIDE) test strip CHECK BLOOD SUGAR 1 TIME PER DAY   HYDROcodone-acetaminophen (NORCO) 7.5-325 MG tablet 1 tablet Orally up to 4 times a day as needed for 30 days   HYDROcodone-acetaminophen (NORCO/VICODIN) 5-325 MG tablet Take 1 tablet by mouth every 4 (four) hours as needed for pain.   hydrOXYzine (ATARAX) 25 MG tablet TAKE 1 TABLET BY MOUTH 3 TIMES DAILY. FOR ANXIETY   ibuprofen (ADVIL) 200 MG tablet Take 400 mg by mouth every 6 (six) hours as needed for fever,  headache or mild pain.   levothyroxine (SYNTHROID) 88 MCG tablet Take 1 tablet (88 mcg total) by mouth daily before breakfast.   metFORMIN (GLUCOPHAGE-XR) 500 MG 24 hr tablet Take 2 tablets (1,000 mg total) by mouth 2 (two) times daily.   multivitamin-iron-minerals-folic acid (CENTRUM) chewable tablet Chew 1 tablet by mouth daily.   naloxegol oxalate (MOVANTIK) 25 MG TABS tablet Take 1 tablet (25 mg total) by mouth daily.   nystatin (NYSTATIN) powder Apply 1 application topically 3 (three) times daily.   nystatin cream (MYCOSTATIN) Apply 1 Application topically 2 (two) times daily.   ondansetron (ZOFRAN) 4 MG tablet Take 1 tablet (4 mg total) by mouth every 8 (eight) hours as needed for nausea or vomiting.   predniSONE (DELTASONE) 10 MG tablet Directions for 6 day taper: Day 1: 2 tablets before breakfast, 1 after both lunch & dinner and 2 at bedtime Day 2: 1 tab before breakfast, 1 after both lunch & dinner and 2 at bedtime Day 3: 1 tab at each meal & 1 at bedtime Day 4: 1 tab at breakfast, 1 at lunch, 1 at bedtime Day 5: 1 tab at breakfast & 1 tab at bedtime Day 6: 1 tab at breakfast   PREMARIN vaginal cream PLACE 1 APPLICATORFUL VAGINALLY DAILY. (Patient taking differently: 1 applicator 2 (two) times a week.)   promethazine-dextromethorphan (PROMETHAZINE-DM) 6.25-15 MG/5ML syrup Take 5 mLs by mouth 4 (four) times daily as needed for cough.   rosuvastatin (CRESTOR) 20 MG tablet Take 1 tablet (20 mg total) by mouth daily.   Tiotropium Bromide Monohydrate (SPIRIVA RESPIMAT) 2.5 MCG/ACT AERS Inhale 2 puffs into the lungs daily.   zolpidem (AMBIEN) 5 MG tablet TAKE 1 TABLET BY MOUTH AT BEDTIME AS NEEDED FOR SLEEP   No facility-administered encounter medications on file as of 01/16/2023.    Allergies (verified) Amoxicillin, Codeine sulfate, Meperidine and related, Minocycline hcl, Penicillins, Propoxyphene n-acetaminophen, and Tetracycline hcl   History: Past Medical History:  Diagnosis Date    Asthma    Back pain    Chronic interstitial cystitis    Followed by Dr. Franchot Gallo   COPD (chronic obstructive pulmonary disease) ([place])    DDD (degenerative disc disease), lumbar    Degeneration of cervical intervertebral disc    Depression    Diabetes mellitus, type 2 ([place]) 2007   Heme positive stool    EGD on 04/08/2010 by Dr. Watt Climes showed chronic gastritis and a few gastric polyps; pathology showed a fundic gland polyp.  Colonoscopy on 04/08/2010 showed small external and internal hemorrhoids, and a few benign-appearing diminutive polyps in the rectum, the distal sigmoid colon, and in the distal descending colon; pathology showed hyperplastic polyps.    Hypertension    Hypothyroidism    Iron deficiency anemia    Spinal stenosis    Past Surgical History:  Procedure Laterality Date   CARPAL TUNNEL RELEASE  05/08/2000   By Dr. Youlanda Mighty. Sypher, Brooke Bonito.   Family History  Problem Relation Age of Onset   Heart attack Father 63   Diabetes Mother    Hypertension Mother    Diabetes Sister    Breast cancer Neg Hx    Colon cancer Neg Hx    Social History   Socioeconomic History   Marital status: Single    Spouse name: Not on file   Number of children: Not on file   Years of education: Not on file   Highest education level: Not on file  Occupational History   Not on file  Tobacco Use   Smoking status: Some Days    Packs/day: 0.50    Years: 45.00    Total pack years: 22.50    Types: Cigarettes   Smokeless tobacco: Never   Tobacco comments:    1/2 pack per day  Vaping Use   Vaping Use: Never used  Substance and Sexual Activity   Alcohol use: No   Drug use: No   Sexual activity: Not Currently  Other Topics Concern   Not on file  Social History Narrative   Not on file   Social Determinants of Health   Financial Resource Strain: Medium Risk (01/16/2023)   Overall Financial Resource Strain (CARDIA)    Difficulty of Paying Living Expenses: Somewhat hard  Food  Insecurity: Food Insecurity Present (01/16/2023)   Hunger Vital Sign    Worried About Running Out of Food in the Last Year: Often true    Ran Out of Food in the Last Year: Often true  Transportation Needs: Unmet Transportation Needs (01/16/2023)   PRAPARE - Transportation    Lack of Transportation (Medical): Yes    Lack of Transportation (Non-Medical): Yes  Physical Activity: Sufficiently Active (01/16/2023)   Exercise Vital Sign    Days of Exercise per Week: 5 days    Minutes of Exercise per Session: 30 min  Stress: Stress Concern Present (01/16/2023)   [place]    Feeling of Stress : To some extent  Social Connections: Moderately Isolated (01/16/2023)   Social Connection and Isolation Panel [NHANES]    Frequency of Communication with Friends and Family: More than three times a week    Frequency of Social Gatherings with Friends and Family: Once a week    Attends Religious Services: Never    Marine scientist or Organizations: No    Attends Music therapist: Never    Marital Status: Living with partner    Tobacco Counseling Ready to quit: Yes Counseling given: No Tobacco comments: 1/2 pack per day   Clinical Intake:  Pre-visit preparation completed: Yes  Pain : 0-10 Pain Score: 8  Pain Type: Chronic pain Pain Location: Leg Pain Orientation: Right, Left Pain Descriptors / Indicators: Aching Pain Onset: 1 to 4 weeks ago Pain Frequency: Occasional     Nutritional Risks: None Diabetes: Yes CBG done?: No  How often do you need to have someone help you when you read instructions, pamphlets, or other written materials from your doctor or pharmacy?: 1 - Never  Diabetic?Nutrition Risk Assessment:  Has the patient had any N/V/D within the last 2 months?  No  Does the patient have any non-healing wounds?  No  Has the patient had any unintentional weight loss or weight gain?  No    Diabetes:  Is the patient diabetic?  Yes  If diabetic, was a CBG obtained today?  Yes  Did the patient bring in their glucometer from home?  No  How often do you monitor your CBG's? Twice a day.   Financial Strains and Diabetes Management:  Are you having any financial strains with the device, your supplies or your medication? No .  Does the patient want to be seen by Chronic Care Management for management of their diabetes?  No  Would the patient like to be referred to a Nutritionist or for Diabetic Management?  No   Diabetic Exams:  Diabetic Eye Exam: Overdue for diabetic eye exam. Pt has been advised about the importance in completing this exam. Patient advised to call and schedule an eye exam. Diabetic Foot Exam: Overdue, Pt has been advised about the importance in completing this exam. Pt is scheduled for diabetic foot exam on next appointment.   Interpreter Needed?: No      Activities of Daily Living    01/16/2023    3:05 PM 01/20/2022   10:00 PM  In your present state of health, do you have any difficulty performing the following activities:  Hearing? 1 0  Vision? 1 0  Difficulty concentrating or making decisions? 1 0  Walking or climbing stairs? 1 0  Dressing or bathing? 1 0  Doing errands, shopping? 0 0  Preparing Food and eating ? N   Using the Toilet? Y   In the past six months, have you accidently leaked urine? N   Do you have problems with loss of bowel control? N   Managing your Medications? N   Managing your Finances? N   Housekeeping or managing your Housekeeping? N     Patient Care Team: Gildardo Pounds, NP as PCP - General (Nurse Practitioner) Clent Jacks, MD as Consulting Physician (Ophthalmology) Webb Laws, [place] as Referring Physician (Optometry)  Indicate any recent Medical Services you may have received from other than [facility] providers in the past year (date may be approximate).     Assessment:   This is a routine wellness examination  for Judson Roch.  Hearing/Vision screen No results found.  Dietary issues and exercise activities discussed: Current Exercise Habits: Home exercise routine, Time (Minutes): 30, Frequency (Times/Week): 5, Weekly Exercise (Minutes/Week): 150, Intensity: Mild, Exercise limited by: None identified   Goals Addressed   None    Depression Screen    01/16/2023    3:37 PM 10/04/2022    3:38 PM 05/16/2022    2:44 PM 04/19/2022    2:03 PM 06/22/2021   10:45 AM 04/08/2021   10:34 AM 03/10/2021   10:12 AM  PHQ 2/9 Scores  PHQ - 2 Score '1 5 1 4 6 3 2  '$ PHQ- 9 Score  '13 4 13 16 11 10    '$ Fall Risk    01/16/2023    3:36 PM 01/03/2023    3:47 PM 10/04/2022    3:38 PM 04/19/2022    1:59 PM 06/22/2021    9:32 AM  Fall Risk   Falls in the past year? 1 0 0 0 1  Number falls in past yr: 0 0 0 0 1  Injury with Fall? 1 0 0 0 1  Risk for fall due to : History of fall(s) No Fall Risks No Fall Risks  Impaired balance/gait;Impaired mobility  Follow up Falls prevention discussed Falls evaluation completed   Falls prevention discussed    FALL RISK PREVENTION PERTAINING TO THE HOME:  Any stairs in or around the  home? Yes  If so, are there any without handrails? Yes  Home free of loose throw rugs in walkways, pet beds, electrical cords, etc? Yes  Adequate lighting in your home to reduce risk of falls? Yes   ASSISTIVE DEVICES UTILIZED TO PREVENT FALLS:  Life alert? No  Use of a cane, walker or w/c? Yes  Grab bars in the bathroom? No  Shower chair or bench in shower? No  Elevated toilet seat or a handicapped toilet? No   TIMED UP AND GO:  Was the test performed? No .  Length of time to ambulate 10 feet: N/A sec.   Gait slow and steady with assistive device  Cognitive Function:    01/16/2023    3:37 PM  MMSE - Mini Mental State Exam  Not completed: Unable to complete        01/16/2023    3:38 PM  6CIT Screen  What Year? 0 points  What month? 0 points  What time? 0 points  Count back from 20 0  points  Months in reverse 2 points  Repeat phrase 0 points  Total Score 2 points    Immunizations Immunization History  Administered Date(s) Administered   Influenza Split 08/09/2011, 07/31/2012   Influenza Whole 09/13/2006, 08/05/2008, 11/24/2009, 10/05/2010   Influenza,inj,Quad PF,6+ Mos 10/10/2013, 09/29/2014, 08/12/2015, 01/15/2017, 09/09/2018, 10/04/2022   Pneumococcal Polysaccharide-23 08/09/2011, 09/09/2018   Td 08/21/2007   Tdap 06/11/2012, 01/04/2023    TDAP status: Due, Education has been provided regarding the importance of this vaccine. Advised may receive this vaccine at local pharmacy or Health Dept. Aware to provide a copy of the vaccination record if obtained from local pharmacy or Health Dept. Verbalized acceptance and understanding.  Flu Vaccine status: Up to date  Pneumococcal vaccine status: Up to date  Covid-19 vaccine status: Completed vaccines  Qualifies for Shingles Vaccine? Yes   Zostavax completed No   Shingrix Completed?: No.    Education has been provided regarding the importance of this vaccine. Patient has been advised to call insurance company to determine out of pocket expense if they have not yet received this vaccine. Advised may also receive vaccine at local pharmacy or Health Dept. Verbalized acceptance and understanding.  Screening Tests Health Maintenance  Topic Date Due   Medicare Annual Wellness (AWV)  Never done   COVID-19 Vaccine (1) Never done   Zoster Vaccines- Shingrix (1 of 2) Never done   Lung Cancer Screening  Never done   Diabetic kidney evaluation - Urine ACR  03/19/2014   MAMMOGRAM  04/23/2015   PAP SMEAR-Modifier  11/18/2019   COLONOSCOPY (Pts 45-39yr Insurance coverage will need to be confirmed)  04/08/2020   FOOT EXAM  07/12/2021   OPHTHALMOLOGY EXAM  11/04/2021   HEMOGLOBIN A1C  01/04/2023   Diabetic kidney evaluation - eGFR measurement  01/04/2024   INFLUENZA VACCINE  Completed   Hepatitis C Screening  Completed    HIV Screening  Completed   HPV VACCINES  Aged Out   DTaP/Tdap/Td  Discontinued    Health Maintenance  Health Maintenance Due  Topic Date Due   Medicare Annual Wellness (AWV)  Never done   COVID-19 Vaccine (1) Never done   Zoster Vaccines- Shingrix (1 of 2) Never done   Lung Cancer Screening  Never done   Diabetic kidney evaluation - Urine ACR  03/19/2014   MAMMOGRAM  04/23/2015   PAP SMEAR-Modifier  11/18/2019   COLONOSCOPY (Pts 45-43yrInsurance coverage will need to be confirmed)  04/08/2020  FOOT EXAM  07/12/2021   OPHTHALMOLOGY EXAM  11/04/2021   HEMOGLOBIN A1C  01/04/2023    Colorectal cancer screening: Referral to GI placed 01/03/2023. Pt aware the office will call re: appt.  Mammogram status: Ordered 10/16/2022. Pt provided with contact info and advised to call to schedule appt.    Lung Cancer Screening: (Low Dose CT Chest recommended if Age 28-80 years, 30 pack-year currently smoking OR have quit w/in 15years.) does qualify. ORDERED 01/03/2023  Lung Cancer Screening Referral: N/A  Additional Screening:  Hepatitis C Screening: does qualify; Completed 05/09/2017  Vision Screening: Recommended annual ophthalmology exams for early detection of glaucoma and other disorders of the eye. Is the patient up to date with their annual eye exam?  No  Who is the provider or what is the name of the office in which the patient attends annual eye exams? [place] If pt is not established with a provider, would they like to be referred to a provider to establish care? No .   Dental Screening: Recommended annual dental exams for proper oral hygiene  Community Resource Referral / Chronic Care Management: CRR required this visit?  No   CCM required this visit?  No      Plan:     I have personally reviewed and noted the following in the patient's chart:   Medical and social history Use of alcohol, tobacco or illicit drugs  Current medications and supplements including  opioid prescriptions. Patient is not currently taking opioid prescriptions. Functional ability and status Nutritional status Physical activity Advanced directives List of other physicians Hospitalizations, surgeries, and ER visits in previous 12 months Vitals Screenings to include cognitive, depression, and falls Referrals and appointments  In addition, I have reviewed and discussed with patient certain preventive protocols, quality metrics, and best practice recommendations. A written personalized care plan for preventive services as well as general preventive health recommendations were provided to patient.     Gomez Cleverly, [place]   01/16/2023   Nurse Notes: I spent 30 minutes on this telephone encounter AVS mailed to patinet

## 2023-01-16 NOTE — Patient Instructions (Addendum)
[id number] to schedule CT scan [phone] to schedule colonoscopy   [name] , Thank you for taking time to come for your Medicare Wellness Visit. I appreciate your ongoing commitment to your health goals. Please review the following plan we discussed and let me know if I can assist you in the future.   These are the goals we discussed:  Goals      Blood Pressure < 140/90     HEMOGLOBIN A1C < 7.0     LDL CALC < 100     Weight (lb) < 200 lb (90.7 kg)        This is a list of the screening recommended for you and due dates:  Health Maintenance  Topic Date Due   Zoster (Shingles) Vaccine (1 of 2) Never done   Screening for Lung Cancer  Never done   Yearly kidney health urinalysis for diabetes  03/19/2014   Mammogram  04/23/2015   Pap Smear  11/18/2019   Colon Cancer Screening  04/08/2020   Complete foot exam   07/12/2021   Eye exam for diabetics  11/04/2021   Hemoglobin A1C  01/04/2023   COVID-19 Vaccine (1) 02/01/2023*   Yearly kidney function blood test for diabetes  01/04/2024   Medicare Annual Wellness Visit  01/17/2024   Flu Shot  Completed   Hepatitis C Screening: USPSTF Recommendation to screen - Ages 18-79 yo.  Completed   HIV Screening  Completed   HPV Vaccine  Aged Out   DTaP/Tdap/Td vaccine  Discontinued  *Topic was postponed. The date shown is not the original due date.   Health Maintenance, Female Adopting a healthy lifestyle and getting preventive care are important in promoting health and wellness. Ask your health care provider about: The right schedule for you to have regular tests and exams. Things you can do on your own to prevent diseases and keep yourself healthy. What should I know about diet, weight, and exercise? Eat a healthy diet  Eat a diet that includes plenty of vegetables, fruits, low-fat dairy products, and lean protein. Do not eat a lot of foods that are high in solid fats, added sugars, or sodium. Maintain a healthy weight Body  mass index (BMI) is used to identify weight problems. It estimates body fat based on height and weight. Your health care provider can help determine your BMI and help you achieve or maintain a healthy weight. Get regular exercise Get regular exercise. This is one of the most important things you can do for your health. Most adults should: Exercise for at least 150 minutes each week. The exercise should increase your heart rate and make you sweat (moderate-intensity exercise). Do strengthening exercises at least twice a week. This is in addition to the moderate-intensity exercise. Spend less time sitting. Even light physical activity can be beneficial. Watch cholesterol and blood lipids Have your blood tested for lipids and cholesterol at 62 years of age, then have this test every 5 years. Have your cholesterol levels checked more often if: Your lipid or cholesterol levels are high. You are older than 62 years of age. You are at high risk for heart disease. What should I know about cancer screening? Depending on your health history and family history, you may need to have cancer screening at various ages. This may include screening for: Breast cancer. Cervical cancer. Colorectal cancer. Skin cancer. Lung cancer. What should I know about heart disease, diabetes, and high blood pressure? Blood pressure and heart disease High blood pressure  causes heart disease and increases the risk of stroke. This is more likely to develop in people who have high blood pressure readings or are overweight. Have your blood pressure checked: Every 3-5 years if you are 25-54 years of age. Every year if you are 78 years old or older. Diabetes Have regular diabetes screenings. This checks your fasting blood sugar level. Have the screening done: Once every three years after age 33 if you are at a normal weight and have a low risk for diabetes. More often and at a younger age if you are overweight or have a high  risk for diabetes. What should I know about preventing infection? Hepatitis B If you have a higher risk for hepatitis B, you should be screened for this virus. Talk with your health care provider to find out if you are at risk for hepatitis B infection. Hepatitis C Testing is recommended for: Everyone born from [birth year] through 1965. Anyone with known risk factors for hepatitis C. Sexually transmitted infections (STIs) Get screened for STIs, including gonorrhea and chlamydia, if: You are sexually active and are younger than 62 years of age. You are older than 62 years of age and your health care provider tells you that you are at risk for this type of infection. Your sexual activity has changed since you were last screened, and you are at increased risk for chlamydia or gonorrhea. Ask your health care provider if you are at risk. Ask your health care provider about whether you are at high risk for HIV. Your health care provider may recommend a prescription medicine to help prevent HIV infection. If you choose to take medicine to prevent HIV, you should first get tested for HIV. You should then be tested every 3 months for as long as you are taking the medicine. Pregnancy If you are about to stop having your period (premenopausal) and you may become pregnant, seek counseling before you get pregnant. Take 400 to 800 micrograms (mcg) of folic acid every day if you become pregnant. Ask for birth control (contraception) if you want to prevent pregnancy. Osteoporosis and menopause Osteoporosis is a disease in which the bones lose minerals and strength with aging. This can result in bone fractures. If you are 35 years old or older, or if you are at risk for osteoporosis and fractures, ask your health care provider if you should: Be screened for bone loss. Take a calcium or vitamin D supplement to lower your risk of fractures. Be given hormone replacement therapy (HRT) to treat symptoms of  menopause. Follow these instructions at home: Alcohol use Do not drink alcohol if: Your health care provider tells you not to drink. You are pregnant, may be pregnant, or are planning to become pregnant. If you drink alcohol: Limit how much you have to: 0-1 drink a day. Know how much alcohol is in your drink. In the [place] one drink equals one 12 oz bottle of beer (355 mL), one 5 oz glass of wine (148 mL), or one 1 oz glass of hard liquor (44 mL). Lifestyle Do not use any products that contain nicotine or tobacco. These products include cigarettes, chewing tobacco, and vaping devices, such as e-cigarettes. If you need help quitting, ask your health care provider. Do not use street drugs. Do not share needles. Ask your health care provider for help if you need support or information about quitting drugs. General instructions Schedule regular health, dental, and eye exams. Stay current with your vaccines. Tell your health  care provider if: You often feel depressed. You have ever been abused or do not feel safe at home. Summary Adopting a healthy lifestyle and getting preventive care are important in promoting health and wellness. Follow your health care provider's instructions about healthy diet, exercising, and getting tested or screened for diseases. Follow your health care provider's instructions on monitoring your cholesterol and blood pressure. This information is not intended to replace advice given to you by your health care provider. Make sure you discuss any questions you have with your health care provider. Document Revised: 03/28/2021 Document Reviewed: 03/28/2021 Elsevier Patient Education  [place].

## 2023-01-21 ENCOUNTER — Other Ambulatory Visit: Payer: Self-pay | Admitting: Nurse Practitioner

## 2023-01-21 DIAGNOSIS — F5105 Insomnia due to other mental disorder: Secondary | ICD-10-CM

## 2023-01-22 ENCOUNTER — Encounter: Payer: Self-pay | Admitting: Nurse Practitioner

## 2023-01-22 ENCOUNTER — Other Ambulatory Visit: Payer: Self-pay | Admitting: Nurse Practitioner

## 2023-01-22 ENCOUNTER — Ambulatory Visit: Payer: Self-pay

## 2023-01-22 ENCOUNTER — Ambulatory Visit: Payer: Medicare HMO | Attending: Nurse Practitioner | Admitting: Nurse Practitioner

## 2023-01-22 VITALS — BP 123/80 | HR 97

## 2023-01-22 DIAGNOSIS — I10 Essential (primary) hypertension: Secondary | ICD-10-CM | POA: Diagnosis not present

## 2023-01-22 DIAGNOSIS — J441 Chronic obstructive pulmonary disease with (acute) exacerbation: Secondary | ICD-10-CM

## 2023-01-22 DIAGNOSIS — R21 Rash and other nonspecific skin eruption: Secondary | ICD-10-CM | POA: Diagnosis not present

## 2023-01-22 DIAGNOSIS — R011 Cardiac murmur, unspecified: Secondary | ICD-10-CM

## 2023-01-22 DIAGNOSIS — I872 Venous insufficiency (chronic) (peripheral): Secondary | ICD-10-CM

## 2023-01-22 MED ORDER — ALBUTEROL SULFATE HFA 108 (90 BASE) MCG/ACT IN AERS: 1.0000 | INHALATION_SPRAY | Freq: Four times a day (QID) | RESPIRATORY_TRACT | 0 refills | Status: DC | PRN

## 2023-01-22 MED ORDER — TRIAMCINOLONE ACETONIDE 0.025 % EX OINT: 1.0000 | TOPICAL_OINTMENT | Freq: Two times a day (BID) | CUTANEOUS | 0 refills | Status: DC

## 2023-01-22 MED ORDER — FLUTICASONE-SALMETEROL 100-50 MCG/ACT IN AEPB: 1.0000 | INHALATION_SPRAY | Freq: Two times a day (BID) | RESPIRATORY_TRACT | 3 refills | Status: DC

## 2023-01-22 MED ORDER — FUROSEMIDE 20 MG PO TABS: 20.0000 mg | ORAL_TABLET | Freq: Every day | ORAL | 0 refills | Status: DC

## 2023-01-22 NOTE — Telephone Encounter (Unsigned)
Copied from [place] [id number]. Topic: General - Other >> Jan 22, 2023  3:53 PM Everette C wrote: Reason for CRM: Medication Refill - Medication: triamcinolone (KENALOG) 0.025 % ointment [id number]  furosemide (LASIX) 20 MG tablet [id number]   Has the patient contacted their pharmacy? Yes.   (Agent: If no, request that the patient contact the pharmacy for the refill. If patient does not wish to contact the pharmacy document the reason why and proceed with request.) (Agent: If yes, when and what did the pharmacy advise?)  Preferred Pharmacy (with phone number or street name): [id number] [place] [place]Phone: [phone]: [phone]: Not open 24 hours   Has the patient been seen for an appointment in the last year OR does the patient have an upcoming appointment? Yes.    Agent: Please be advised that RX refills may take up to 3 business days. We ask that you follow-up with your pharmacy.

## 2023-01-22 NOTE — Telephone Encounter (Signed)
  Chief Complaint: Both legs swollen, painful and red Symptoms: above Frequency: Friday Pertinent Negatives: Patient denies fever, SOB, chest pain Disposition: '[]'$ ED /'[]'$ Urgent Care (no appt availability in office) / '[x]'$ Appointment(In office/virtual)/ '[]'$  [facility]/ '[]'$ Home Care/ '[]'$ Refused Recommended Disposition /'[]'$ [facility]/ '[]'$  Follow-up with PCP Additional Notes: Pt states that the "fluid" pills were stopped. Her legs started swelling Friday. Pt states that pain is 9/10, and she can hardly walk.   Reason for Disposition  [1] MODERATE leg swelling (e.g., swelling extends up to knees) AND [2] new-onset or worsening  Answer Assessment - Initial Assessment Questions 1. ONSET: "When did the swelling start?" (e.g., minutes, hours, days)     Friday 2. LOCATION: "What part of the leg is swollen?"  "Are both legs swollen or just one leg?"     Below knees 3. SEVERITY: "How bad is the swelling?" (e.g., localized; mild, moderate, severe)   - Localized: Small area of swelling localized to one leg.   - MILD pedal edema: Swelling limited to foot and ankle, pitting edema < 1/4 inch (6 mm) deep, rest and elevation eliminate most or all swelling.   - MODERATE edema: Swelling of lower leg to knee, pitting edema > 1/4 inch (6 mm) deep, rest and elevation only partially reduce swelling.   - SEVERE edema: Swelling extends above knee, facial or hand swelling present.      moderate 4. REDNESS: "Does the swelling look red or infected?"     Redness - warm 5. PAIN: "Is the swelling painful to touch?" If Yes, ask: "How painful is it?"   (Scale 1-10; mild, moderate or severe)     9/10 6. FEVER: "Do you have a fever?" If Yes, ask: "What is it, how was it measured, and when did it start?"      no 7. CAUSE: "What do you think is causing the leg swelling?"     Stopped fluid pills 8. MEDICAL HISTORY: "Do you have a history of blood clots (e.g., DVT), cancer, heart failure, kidney disease, or  liver failure?"     No - HTN 9. RECURRENT SYMPTOM: "Have you had leg swelling before?" If Yes, ask: "When was the last time?" "What happened that time?"     yes 10. OTHER SYMPTOMS: "Do you have any other symptoms?" (e.g., chest pain, difficulty breathing)       no  Protocols used: Leg Swelling and Edema-A-AH

## 2023-01-22 NOTE — Progress Notes (Signed)
3.  Assessment & Plan:  Mary Gray was seen today for leg swelling.  Diagnoses and all orders for this visit:  Chronic venous insufficiency Unfortunately her insurance does not cover Compression stockings Instructed her to purchase on her own.  -     Ambulatory referral to Cardiology -     CMP14+EGFR  Essential hypertension Well controlled. Needs to stop smoking.  -     Ambulatory referral to Cardiology -     CMP14+EGFR -     furosemide (LASIX) 20 MG tablet; Take 1 tablet (20 mg total) by mouth daily. BP Readings from Last 3 Encounters:  01/22/23 123/80  01/03/23 106/68  12/30/22 123/74     Skin rash -     triamcinolone (KENALOG) 0.025 % ointment; Apply 1 Application topically 2 (two) times daily.  Heart murmur -     Ambulatory referral to Cardiology    Patient has been counseled on age-appropriate routine health concerns for screening and prevention. These are reviewed and up-to-date. Referrals have been placed accordingly. Immunizations are up-to-date or declined.    Subjective:   Chief Complaint  Patient presents with   Leg Swelling   HPI Mary Gray 62 y.o. female presents to office today with concerns of BLE swelling  She is accompanied by her niece today.   Unfortunately we had to stop her lasix a few weeks ago due to hyponatremia. Today she presents with mild nonpitting painful BLE swelling and red rash on the front of both lower legs. She has not been wearing compression stockings as she does not own any. She continues to smoke. Based on physical exam today there is very low suspicion for DVT. She has a history of chronic venous insufficiency and has been taking furosemide 40 mg daily.   Review of Systems  Constitutional:  Negative for fever, malaise/fatigue and weight loss.  HENT: Negative.  Negative for nosebleeds.   Eyes: Negative.  Negative for blurred vision, double vision and photophobia.  Respiratory:  Positive for cough (chronic). Negative for  shortness of breath.   Cardiovascular:  Positive for leg swelling. Negative for chest pain, palpitations, orthopnea, claudication and PND.  Gastrointestinal: Negative.  Negative for heartburn, nausea and vomiting.  Musculoskeletal: Negative.  Negative for myalgias.  Skin:  Positive for rash.  Neurological: Negative.  Negative for dizziness, focal weakness, seizures and headaches.  Psychiatric/Behavioral: Negative.  Negative for suicidal ideas.     Past Medical History:  Diagnosis Date   Asthma    Back pain    Chronic interstitial cystitis    Followed by Dr. Franchot Gallo   COPD (chronic obstructive pulmonary disease) ([place])    DDD (degenerative disc disease), lumbar    Degeneration of cervical intervertebral disc    Depression    Diabetes mellitus, type 2 ([place]) 2007   Heme positive stool    EGD on 04/08/2010 by Dr. Watt Climes showed chronic gastritis and a few gastric polyps; pathology showed a fundic gland polyp.  Colonoscopy on 04/08/2010 showed small external and internal hemorrhoids, and a few benign-appearing diminutive polyps in the rectum, the distal sigmoid colon, and in the distal descending colon; pathology showed hyperplastic polyps.    Hypertension    Hypothyroidism    Iron deficiency anemia    Spinal stenosis     Past Surgical History:  Procedure Laterality Date   CARPAL TUNNEL RELEASE  05/08/2000   By Dr. Youlanda Mighty. Sypher, Brooke Bonito.    Family History  Problem Relation Age of Onset  Heart attack Father 10   Diabetes Mother    Hypertension Mother    Diabetes Sister    Breast cancer Neg Hx    Colon cancer Neg Hx     Social History Reviewed with no changes to be made today.   Outpatient Medications Prior to Visit  Medication Sig Dispense Refill   Accu-Chek Softclix Lancets lancets Check blood sugar 1 time per day 100 each 3   acetaminophen (TYLENOL) 500 MG tablet Take 1,000 mg by mouth every 6 (six) hours as needed for mild pain.     albuterol (PROVENTIL) (2.5  MG/3ML) 0.083% nebulizer solution Take 3 mLs (2.5 mg total) by nebulization every 6 (six) hours as needed for wheezing or shortness of breath. 150 mL 1   albuterol (VENTOLIN HFA) 108 (90 Base) MCG/ACT inhaler Inhale 1-2 puffs into the lungs every 6 (six) hours as needed for wheezing or shortness of breath. 18 g 0   amitriptyline (ELAVIL) 25 MG tablet Take 1 tablet (25 mg total) by mouth at bedtime. 90 tablet 3   amLODipine (NORVASC) 5 MG tablet Take 1 tablet (5 mg total) by mouth daily. 90 tablet 1   aspirin EC 81 MG tablet Take 81 mg by mouth daily. Swallow whole.     azithromycin (ZITHROMAX) 250 MG tablet Take 1 tablet (250 mg total) by mouth daily. Take first 2 tablets together, then 1 every day until finished. 6 tablet 0   benzonatate (TESSALON) 100 MG capsule Take 1 capsule (100 mg total) by mouth 3 (three) times daily as needed for cough. 21 capsule 0   budesonide-formoterol (SYMBICORT) 160-4.5 MCG/ACT inhaler Inhale 2 puffs into the lungs in the morning and at bedtime. Rinse mouth after use. 1 each 0   [facility] VITAMIN B12 1000 MCG tablet TAKE 1 TABLET BY MOUTH EVERY DAY (Patient taking differently: Take 1,000 mcg by mouth daily.) 90 tablet 3   cyclobenzaprine (FLEXERIL) 5 MG tablet TAKE 1 TABLET BY MOUTH EVERYDAY AT BEDTIME 30 tablet 0   Dextromethorphan-guaiFENesin (CORICIDIN HBP CONGESTION/COUGH) 10-200 MG CAPS Take 1 Dose by mouth every 6 (six) hours as needed. 30 capsule 0   enalapril (VASOTEC) 20 MG tablet Take 1 tablet (20 mg total) by mouth daily. 30 tablet 5   esomeprazole (NEXIUM) 40 MG capsule TAKE 1 CAPSULE (40 MG TOTAL) BY MOUTH DAILY. 90 capsule 1   gabapentin (NEURONTIN) 300 MG capsule Take 1 capsule (300 mg total) by mouth 3 (three) times daily. 90 capsule 3   glucose blood (ACCU-CHEK GUIDE) test strip CHECK BLOOD SUGAR 1 TIME PER DAY 100 strip 3   HYDROcodone-acetaminophen (NORCO) 7.5-325 MG tablet 1 tablet Orally up to 4 times a day as needed for 30 days      HYDROcodone-acetaminophen (NORCO/VICODIN) 5-325 MG tablet Take 1 tablet by mouth every 4 (four) hours as needed for pain.     hydrOXYzine (ATARAX) 25 MG tablet TAKE 1 TABLET BY MOUTH 3 TIMES DAILY. FOR ANXIETY 270 tablet 1   ibuprofen (ADVIL) 200 MG tablet Take 400 mg by mouth every 6 (six) hours as needed for fever, headache or mild pain.     levothyroxine (SYNTHROID) 88 MCG tablet Take 1 tablet (88 mcg total) by mouth daily before breakfast. 90 tablet 2   metFORMIN (GLUCOPHAGE-XR) 500 MG 24 hr tablet Take 2 tablets (1,000 mg total) by mouth 2 (two) times daily. 360 tablet 1   multivitamin-iron-minerals-folic acid (CENTRUM) chewable tablet Chew 1 tablet by mouth daily. 90 tablet 3  naloxegol oxalate (MOVANTIK) 25 MG TABS tablet Take 1 tablet (25 mg total) by mouth daily. 90 tablet 1   nystatin (NYSTATIN) powder Apply 1 application topically 3 (three) times daily. 15 g 0   nystatin cream (MYCOSTATIN) Apply 1 Application topically 2 (two) times daily. 60 g 3   ondansetron (ZOFRAN) 4 MG tablet Take 1 tablet (4 mg total) by mouth every 8 (eight) hours as needed for nausea or vomiting. 30 tablet 1   PREMARIN vaginal cream PLACE 1 APPLICATORFUL VAGINALLY DAILY. (Patient taking differently: 1 applicator 2 (two) times a week.) 30 g 1   promethazine-dextromethorphan (PROMETHAZINE-DM) 6.25-15 MG/5ML syrup Take 5 mLs by mouth 4 (four) times daily as needed for cough. 240 mL 0   rosuvastatin (CRESTOR) 20 MG tablet Take 1 tablet (20 mg total) by mouth daily. 90 tablet 3   Tiotropium Bromide Monohydrate (SPIRIVA RESPIMAT) 2.5 MCG/ACT AERS Inhale 2 puffs into the lungs daily. 4 g 0   zolpidem (AMBIEN) 5 MG tablet TAKE 1 TABLET BY MOUTH AT BEDTIME AS NEEDED FOR SLEEP 30 tablet 1   furosemide (LASIX) 40 MG tablet Take 1 tablet (40 mg total) by mouth daily. 90 tablet 1   predniSONE (DELTASONE) 10 MG tablet Directions for 6 day taper: Day 1: 2 tablets before breakfast, 1 after both lunch & dinner and 2 at bedtime Day  2: 1 tab before breakfast, 1 after both lunch & dinner and 2 at bedtime Day 3: 1 tab at each meal & 1 at bedtime Day 4: 1 tab at breakfast, 1 at lunch, 1 at bedtime Day 5: 1 tab at breakfast & 1 tab at bedtime Day 6: 1 tab at breakfast 21 tablet 0   No facility-administered medications prior to visit.    Allergies  Allergen Reactions   Amoxicillin Other (See Comments)   Codeine Sulfate Other (See Comments)    Confusion, anxiety, fatigue   Meperidine And Related Nausea And Vomiting   Minocycline Hcl Nausea And Vomiting    Hospitalized for vomiting along with allergic reaction   Penicillins Other (See Comments)    Has patient had a PCN reaction causing immediate rash, facial/tongue/throat swelling, SOB or lightheadedness with hypotension: Yes Has patient had a PCN reaction causing severe rash involving mucus membranes or skin necrosis: Yes Has patient had a PCN reaction that required hospitalization Yes Has patient had a PCN reaction occurring within the last 10 years: Yes If all of the above answers are "NO", then may proceed with Cephalosporin use.    Propoxyphene N-Acetaminophen Other (See Comments)    confused   Tetracycline Hcl Other (See Comments)    Vomiting Note: Patient took a course of doxycycline in June 2014 without any problems.       Objective:    BP 123/80   Pulse 97   LMP 01/17/2009   SpO2 100%  Wt Readings from Last 3 Encounters:  01/03/23 180 lb 9.6 oz (81.9 kg)  12/23/22 180 lb (81.6 kg)  10/04/22 185 lb 12.8 oz (84.3 kg)    Physical Exam Vitals and nursing note reviewed.  Constitutional:      Appearance: She is well-developed.  HENT:     Head: Normocephalic and atraumatic.  Cardiovascular:     Rate and Rhythm: Normal rate and regular rhythm.     Heart sounds: Murmur heard.     No friction rub. No gallop.  Pulmonary:     Effort: Pulmonary effort is normal. No tachypnea or respiratory distress.  Breath sounds: Normal breath sounds. No decreased  breath sounds, wheezing, rhonchi or rales.  Chest:     Chest wall: No tenderness.  Abdominal:     General: Bowel sounds are normal.     Palpations: Abdomen is soft.  Musculoskeletal:        General: Normal range of motion.     Cervical back: Normal range of motion.     Right lower leg: Swelling and tenderness present.     Left lower leg: Swelling and tenderness present.     Right foot: Swelling present.     Left foot: Swelling present.  Skin:    General: Skin is warm and dry.  Neurological:     Mental Status: She is alert and oriented to person, place, and time.     Coordination: Coordination normal.  Psychiatric:        Behavior: Behavior normal. Behavior is cooperative.        Thought Content: Thought content normal.        Judgment: Judgment normal.          Patient has been counseled extensively about nutrition and exercise as well as the importance of adherence with medications and regular follow-up. The patient was given clear instructions to go to ER or return to medical center if symptoms don't improve, worsen or new problems develop. The patient verbalized understanding.   Follow-up: Return for f/u 3 weeks labs.   Gildardo Pounds, FNP-BC [facility] [place], [place]   01/22/2023, 4:02 PM

## 2023-01-23 LAB — CMP14+EGFR
ALT: 20 IU/L (ref 0–32)
AST: 27 IU/L (ref 0–40)
Albumin/Globulin Ratio: 2.1 (ref 1.2–2.2)
Albumin: 4 g/dL (ref 3.9–4.9)
Alkaline Phosphatase: 69 IU/L (ref 44–121)
BUN/Creatinine Ratio: 12 (ref 12–28)
BUN: 8 mg/dL (ref 8–27)
Bilirubin Total: 0.2 mg/dL (ref 0.0–1.2)
CO2: 23 mmol/L (ref 20–29)
Calcium: 8.9 mg/dL (ref 8.7–10.3)
Chloride: 95 mmol/L — ABNORMAL LOW (ref 96–106)
Creatinine, Ser: 0.68 mg/dL (ref 0.57–1.00)
Globulin, Total: 1.9 g/dL (ref 1.5–4.5)
Glucose: 162 mg/dL — ABNORMAL HIGH (ref 70–99)
Potassium: 4.7 mmol/L (ref 3.5–5.2)
Sodium: 136 mmol/L (ref 134–144)
Total Protein: 5.9 g/dL — ABNORMAL LOW (ref 6.0–8.5)
eGFR: 98 mL/min/{1.73_m2} (ref >59)

## 2023-01-23 NOTE — Telephone Encounter (Signed)
Requested Prescriptions  Pending Prescriptions Disp Refills   triamcinolone (KENALOG) 0.025 % ointment 30 g 0    Sig: Apply 1 Application topically 2 (two) times daily.     Not Delegated - Dermatology:  Corticosteroids Failed - 01/22/2023  5:22 PM      Failed - This refill cannot be delegated      Passed - Valid encounter within last 12 months    Recent Outpatient Visits           Yesterday Chronic venous insufficiency   [place], Vernia Buff, NP   2 weeks ago Primary hypertension   [place], NP   3 months ago Chronic constipation   [place], [place]   8 months ago [place], Vernia Buff, NP   9 months ago Encounter to establish care   [place] Gildardo Pounds, NP       Future Appointments             In 3 weeks Gildardo Pounds, NP [place]   In 3 months Gildardo Pounds, NP [place]             furosemide (LASIX) 20 MG tablet 90 tablet 0    Sig: Take 1 tablet (20 mg total) by mouth daily.     Cardiovascular:  Diuretics - Loop Failed - 01/22/2023  5:22 PM      Failed - Na in normal range and within 180 days    Sodium  Date Value Ref Range Status  01/22/2023 136 134 - 144 mmol/L Final         Failed - Cl in normal range and within 180 days    Chloride  Date Value Ref Range Status  01/22/2023 95 (L) 96 - 106 mmol/L Final         Failed - Mg Level in normal range and within 180 days    No results found for: "MG"       Passed - K in normal range and within 180 days    Potassium  Date Value Ref Range Status  01/22/2023 4.7 3.5 - 5.2 mmol/L Final         Passed - Ca in normal range and within 180 days    Calcium  Date Value Ref Range Status   01/22/2023 8.9 8.7 - 10.3 mg/dL Final         Passed - Cr in normal range and within 180 days    Creat  Date Value Ref Range Status  02/09/2015 0.55 0.50 - 1.10 mg/dL Final   Creatinine, Ser  Date Value Ref Range Status  01/22/2023 0.68 0.57 - 1.00 mg/dL Final   Creatinine,U  Date Value Ref Range Status  04/29/2010 71.9 mg/dL Final   Creatinine, Urine  Date Value Ref Range Status  03/22/2015 27.33 >20.0 mg/dL Final    Comment:    * (PPS) Presumptive positive screen result to be verified by         quantitative LC/MS or GC/MS confirmation testing.          Passed - Last BP in normal range    BP Readings from Last 1 Encounters:  01/22/23 123/80  Passed - Valid encounter within last 6 months    Recent Outpatient Visits           Yesterday Chronic venous insufficiency   [place], Vernia Buff, NP   2 weeks ago Primary hypertension   [place], NP   3 months ago Chronic constipation   [place], [place]   8 months ago [place], Vernia Buff, NP   9 months ago Encounter to establish care   [facility] Gildardo Pounds, NP       Future Appointments             In 3 weeks Gildardo Pounds, NP [place]   In 3 months Gildardo Pounds, NP [place]

## 2023-01-25 ENCOUNTER — Encounter: Payer: Self-pay | Admitting: Physician Assistant

## 2023-01-26 ENCOUNTER — Other Ambulatory Visit: Payer: Self-pay | Admitting: Physician Assistant

## 2023-01-29 ENCOUNTER — Emergency Department (HOSPITAL_COMMUNITY)
Admission: EM | Admit: 2023-01-29 | Discharge: 2023-01-29 | Disposition: A | Discharge status: Home / Self Care | Payer: Medicare HMO | Attending: Emergency Medicine | Admitting: Emergency Medicine

## 2023-01-29 ENCOUNTER — Ambulatory Visit (HOSPITAL_COMMUNITY)
Admission: EM | Admit: 2023-01-29 | Discharge: 2023-01-29 | Disposition: A | Discharge status: Home / Self Care | Payer: Medicare HMO | Attending: Family Medicine | Admitting: Family Medicine

## 2023-01-29 ENCOUNTER — Encounter (HOSPITAL_COMMUNITY): Payer: Self-pay

## 2023-01-29 ENCOUNTER — Encounter (HOSPITAL_COMMUNITY): Payer: Self-pay | Admitting: Emergency Medicine

## 2023-01-29 ENCOUNTER — Emergency Department (HOSPITAL_COMMUNITY): Payer: Medicare HMO

## 2023-01-29 DIAGNOSIS — Z79899 Other long term (current) drug therapy: Secondary | ICD-10-CM | POA: Diagnosis not present

## 2023-01-29 DIAGNOSIS — I872 Venous insufficiency (chronic) (peripheral): Secondary | ICD-10-CM | POA: Diagnosis not present

## 2023-01-29 DIAGNOSIS — R600 Localized edema: Secondary | ICD-10-CM | POA: Diagnosis not present

## 2023-01-29 DIAGNOSIS — R079 Chest pain, unspecified: Secondary | ICD-10-CM | POA: Diagnosis not present

## 2023-01-29 DIAGNOSIS — R2243 Localized swelling, mass and lump, lower limb, bilateral: Secondary | ICD-10-CM | POA: Insufficient documentation

## 2023-01-29 DIAGNOSIS — R609 Edema, unspecified: Secondary | ICD-10-CM

## 2023-01-29 DIAGNOSIS — Z7982 Long term (current) use of aspirin: Secondary | ICD-10-CM | POA: Diagnosis not present

## 2023-01-29 DIAGNOSIS — R0602 Shortness of breath: Secondary | ICD-10-CM | POA: Diagnosis not present

## 2023-01-29 DIAGNOSIS — Z7984 Long term (current) use of oral hypoglycemic drugs: Secondary | ICD-10-CM | POA: Diagnosis not present

## 2023-01-29 DIAGNOSIS — J449 Chronic obstructive pulmonary disease, unspecified: Secondary | ICD-10-CM | POA: Diagnosis not present

## 2023-01-29 DIAGNOSIS — M79604 Pain in right leg: Secondary | ICD-10-CM

## 2023-01-29 DIAGNOSIS — M79605 Pain in left leg: Secondary | ICD-10-CM | POA: Diagnosis not present

## 2023-01-29 DIAGNOSIS — I10 Essential (primary) hypertension: Secondary | ICD-10-CM | POA: Diagnosis not present

## 2023-01-29 DIAGNOSIS — I878 Other specified disorders of veins: Secondary | ICD-10-CM

## 2023-01-29 LAB — CBC WITH DIFFERENTIAL/PLATELET
Abs Immature Granulocytes: 0.02 10*3/uL (ref 0.00–0.07)
Basophils Absolute: 0.1 10*3/uL (ref 0.0–0.1)
Basophils Relative: 1 %
Eosinophils Absolute: 0.3 10*3/uL (ref 0.0–0.5)
Eosinophils Relative: 4 %
HCT: 31.5 % — ABNORMAL LOW (ref 36.0–46.0)
Hemoglobin: 10.5 g/dL — ABNORMAL LOW (ref 12.0–15.0)
Immature Granulocytes: 0 %
Lymphocytes Relative: 23 %
Lymphs Abs: 1.5 10*3/uL (ref 0.7–4.0)
MCH: 29.7 pg (ref 26.0–34.0)
MCHC: 33.3 g/dL (ref 30.0–36.0)
MCV: 89.2 fL (ref 80.0–100.0)
Monocytes Absolute: 0.4 10*3/uL (ref 0.1–1.0)
Monocytes Relative: 7 %
Neutro Abs: 4.3 10*3/uL (ref 1.7–7.7)
Neutrophils Relative %: 65 %
Platelets: 228 10*3/uL (ref 150–400)
RBC: 3.53 MIL/uL — ABNORMAL LOW (ref 3.87–5.11)
RDW: 13.3 % (ref 11.5–15.5)
WBC: 6.6 10*3/uL (ref 4.0–10.5)
nRBC: 0 % (ref 0.0–0.2)

## 2023-01-29 LAB — COMPREHENSIVE METABOLIC PANEL
ALT: 23 U/L (ref 0–44)
AST: 39 U/L (ref 15–41)
Albumin: 3.3 g/dL — ABNORMAL LOW (ref 3.5–5.0)
Alkaline Phosphatase: 63 U/L (ref 38–126)
Anion gap: 11 (ref 5–15)
BUN: 5 mg/dL — ABNORMAL LOW (ref 8–23)
CO2: 29 mmol/L (ref 22–32)
Calcium: 8.8 mg/dL — ABNORMAL LOW (ref 8.9–10.3)
Chloride: 92 mmol/L — ABNORMAL LOW (ref 98–111)
Creatinine, Ser: 0.79 mg/dL (ref 0.44–1.00)
GFR, Estimated: >60 mL/min (ref >60)
Glucose, Bld: 213 mg/dL — ABNORMAL HIGH (ref 70–99)
Potassium: 3.8 mmol/L (ref 3.5–5.1)
Sodium: 132 mmol/L — ABNORMAL LOW (ref 135–145)
Total Bilirubin: 0.3 mg/dL (ref 0.3–1.2)
Total Protein: 6 g/dL — ABNORMAL LOW (ref 6.5–8.1)

## 2023-01-29 LAB — TROPONIN I (HIGH SENSITIVITY)
Troponin I (High Sensitivity): 7 ng/L (ref <18)
Troponin I (High Sensitivity): 9 ng/L (ref <18)

## 2023-01-29 MED ORDER — FUROSEMIDE 10 MG/ML IJ SOLN
40.0000 mg | Freq: Once | INTRAMUSCULAR | Status: AC
  Administered 2023-01-29: 40 mg via INTRAVENOUS
  Filled 2023-01-29: qty 4

## 2023-01-29 MED ORDER — KETOROLAC TROMETHAMINE 30 MG/ML IJ SOLN
30.0000 mg | Freq: Once | INTRAMUSCULAR | Status: AC
  Administered 2023-01-29: 30 mg via INTRAMUSCULAR

## 2023-01-29 MED ORDER — ONDANSETRON 4 MG PO TBDP
4.0000 mg | ORAL_TABLET | Freq: Once | ORAL | Status: AC
  Administered 2023-01-29: 4 mg via ORAL
  Filled 2023-01-29: qty 1

## 2023-01-29 MED ORDER — KETOROLAC TROMETHAMINE 30 MG/ML IJ SOLN
INTRAMUSCULAR | Status: AC
  Filled 2023-01-29: qty 1

## 2023-01-29 MED ORDER — HYDROCODONE-ACETAMINOPHEN 5-325 MG PO TABS
2.0000 | ORAL_TABLET | Freq: Once | ORAL | Status: AC
  Administered 2023-01-29: 2 via ORAL
  Filled 2023-01-29: qty 2

## 2023-01-29 NOTE — ED Triage Notes (Signed)
Pt here sent from [facility] with continued bil leg pain and some slight redness noted to both legs , pt just recently started back on '20mg'$  of lasix  , stopped wearing teds hose

## 2023-01-29 NOTE — ED Provider Notes (Signed)
[facility] Provider Note   CSN: [id number] Arrival date & time: 01/29/23  1110     History  No chief complaint on file.   Mary Gray is a 62 y.o. female.  Patient complains of swelling in bilateral lower legs.  Patient reports the swelling is now above her knees.  Patient reports that she has been told she needs to wear TED hose but she has not been wearing them because she cannot afford them.  Patient states that she is taking Lasix at home but swelling has increased.  Patient reports both legs are now red.  Patient reports her primary care doctor prescribed a cream to put on her legs but it causes burning.  Patient denies any shortness of breath she denies any fever or chills  The history is provided by the patient. No language interpreter was used.       Home Medications Prior to Admission medications   Medication Sig Start Date End Date Taking? Authorizing Provider  Accu-Chek Softclix Lancets lancets Check blood sugar 1 time per day 07/12/20   Axel Filler, MD  acetaminophen (TYLENOL) 500 MG tablet Take 1,000 mg by mouth every 6 (six) hours as needed for mild pain.    [provider]  albuterol (PROVENTIL) (2.5 MG/3ML) 0.083% nebulizer solution Take 3 mLs (2.5 mg total) by nebulization every 6 (six) hours as needed for wheezing or shortness of breath. 01/03/23   Gildardo Pounds, NP  albuterol (VENTOLIN HFA) 108 (90 Base) MCG/ACT inhaler Inhale 1-2 puffs into the lungs every 6 (six) hours as needed for wheezing or shortness of breath. 01/22/23   Gildardo Pounds, NP  amitriptyline (ELAVIL) 25 MG tablet Take 1 tablet (25 mg total) by mouth at bedtime. 10/23/22   Gildardo Pounds, NP  amLODipine (NORVASC) 5 MG tablet Take 1 tablet (5 mg total) by mouth daily. 10/04/22 04/02/23  Argentina Donovan, PA-C  aspirin EC 81 MG tablet Take 81 mg by mouth daily. Swallow whole.    [provider]  [employer] VITAMIN B12 1000 MCG  tablet TAKE 1 TABLET BY MOUTH EVERY DAY Patient taking differently: Take 1,000 mcg by mouth daily. 01/12/21   Axel Filler, MD  cyclobenzaprine (FLEXERIL) 5 MG tablet TAKE 1 TABLET BY MOUTH EVERYDAY AT BEDTIME 01/01/23   Charlott Rakes, MD  Dextromethorphan-guaiFENesin (CORICIDIN HBP CONGESTION/COUGH) 10-200 MG CAPS Take 1 Dose by mouth every 6 (six) hours as needed. 12/30/22   Allwardt, Alyssa M, PA-C  enalapril (VASOTEC) 20 MG tablet Take 1 tablet (20 mg total) by mouth daily. 10/04/22   Argentina Donovan, PA-C  esomeprazole (NEXIUM) 40 MG capsule TAKE 1 CAPSULE (40 MG TOTAL) BY MOUTH DAILY. 10/09/22 04/07/23  Charlott Rakes, MD  fluticasone-salmeterol (ADVAIR) 100-50 MCG/ACT AEPB Inhale 1 puff into the lungs 2 (two) times daily. 01/22/23   Gildardo Pounds, NP  furosemide (LASIX) 20 MG tablet Take 1 tablet (20 mg total) by mouth daily. 01/22/23 04/22/23  Gildardo Pounds, NP  gabapentin (NEURONTIN) 300 MG capsule Take 1 capsule (300 mg total) by mouth 3 (three) times daily. 11/29/22   Gildardo Pounds, NP  glucose blood (ACCU-CHEK GUIDE) test strip CHECK BLOOD SUGAR 1 TIME PER DAY 05/12/21   Axel Filler, MD  hydrOXYzine (ATARAX) 25 MG tablet TAKE 1 TABLET BY MOUTH 3 TIMES DAILY. FOR ANXIETY 06/07/22   Charlott Rakes, MD  ibuprofen (ADVIL) 200 MG tablet Take 400 mg by mouth  every 6 (six) hours as needed for fever, headache or mild pain.    [provider]  levothyroxine (SYNTHROID) 88 MCG tablet Take 1 tablet (88 mcg total) by mouth daily before breakfast. 01/03/23   Gildardo Pounds, NP  metFORMIN (GLUCOPHAGE-XR) 500 MG 24 hr tablet Take 2 tablets (1,000 mg total) by mouth 2 (two) times daily. 10/04/22   Argentina Donovan, PA-C  multivitamin-iron-minerals-folic acid (CENTRUM) chewable tablet Chew 1 tablet by mouth daily. 07/12/20   Axel Filler, MD  naloxegol oxalate (MOVANTIK) 25 MG TABS tablet Take 1 tablet (25 mg total) by mouth daily. 01/03/23   Gildardo Pounds, NP   nystatin (NYSTATIN) powder Apply 1 application topically 3 (three) times daily. 03/10/21   Harvie Heck, MD  nystatin cream (MYCOSTATIN) Apply 1 Application topically 2 (two) times daily. 01/03/23   Gildardo Pounds, NP  ondansetron (ZOFRAN) 4 MG tablet Take 1 tablet (4 mg total) by mouth every 8 (eight) hours as needed for nausea or vomiting. 01/03/23   Gildardo Pounds, NP  PREMARIN vaginal cream PLACE 1 APPLICATORFUL VAGINALLY DAILY. Patient taking differently: 1 applicator 2 (two) times a week. 11/23/21   Axel Filler, MD  promethazine-dextromethorphan (PROMETHAZINE-DM) 6.25-15 MG/5ML syrup Take 5 mLs by mouth 4 (four) times daily as needed for cough. 05/16/22   Gildardo Pounds, NP  rosuvastatin (CRESTOR) 20 MG tablet Take 1 tablet (20 mg total) by mouth daily. 10/04/22   Argentina Donovan, PA-C  Tiotropium Bromide Monohydrate (SPIRIVA RESPIMAT) 2.5 MCG/ACT AERS Inhale 2 puffs into the lungs daily. 12/30/22   Allwardt, Randa Evens, PA-C  triamcinolone (KENALOG) 0.025 % ointment Apply 1 Application topically 2 (two) times daily. 01/22/23   Gildardo Pounds, NP  zolpidem (AMBIEN) 5 MG tablet Take 1 tablet (5 mg total) by mouth at bedtime. 01/22/23   Gildardo Pounds, NP      Allergies    Amoxicillin, Codeine sulfate, Meperidine and related, Minocycline hcl, Penicillins, Propoxyphene n-acetaminophen, and Tetracycline hcl    Review of Systems   Review of Systems  All other systems reviewed and are negative.   Physical Exam Updated Vital Signs BP (!) 140/93 (BP Location: Right Arm)   Pulse 95   Temp 98.8 F (37.1 C) (Oral)   Resp 18   LMP 01/17/2009   SpO2 93%  Physical Exam Vitals and nursing note reviewed.  Constitutional:      Appearance: She is well-developed.  HENT:     Head: Normocephalic.  Cardiovascular:     Rate and Rhythm: Normal rate and regular rhythm.  Pulmonary:     Effort: Pulmonary effort is normal.  Abdominal:     General: There is no distension.   Musculoskeletal:        General: Swelling and tenderness present.     Cervical back: Normal range of motion.     Comments: Edema bilateral lower extremities erythema distal half of legs  Skin:    General: Skin is warm.  Neurological:     Mental Status: She is alert and oriented to person, place, and time.  Psychiatric:        Mood and Affect: Mood normal.     ED Results / Procedures / Treatments   Labs (all labs ordered are listed, but only abnormal results are displayed) Labs Reviewed  CBC WITH DIFFERENTIAL/PLATELET - Abnormal; Notable for the following components:      Result Value   RBC 3.53 (*)    Hemoglobin 10.5 (*)  HCT 31.5 (*)    All other components within normal limits  COMPREHENSIVE METABOLIC PANEL - Abnormal; Notable for the following components:   Sodium 132 (*)    Chloride 92 (*)    Glucose, Bld 213 (*)    BUN 5 (*)    Calcium 8.8 (*)    Total Protein 6.0 (*)    Albumin 3.3 (*)    All other components within normal limits  TROPONIN I (HIGH SENSITIVITY)  TROPONIN I (HIGH SENSITIVITY)    EKG EKG Interpretation  Date/Time:  Monday January 29 2023 12:48:55 EDT Ventricular Rate:  93 PR Interval:  140 QRS Duration: 74 QT Interval:  354 QTC Calculation: 440 R Axis:   90 Text Interpretation: Normal sinus rhythm Rightward axis Nonspecific ST abnormality Abnormal ECG When compared with ECG of 20-Jan-2022 12:34, PREVIOUS ECG IS PRESENT Confirmed by Dene Gentry [phone]) on 01/29/2023 4:10:25 PM  Radiology DG Chest 1 View  Result Date: 01/29/2023 CLINICAL DATA:  Fluid on legs for several days, shortness of breath, leg pain, diabetes mellitus, hypertension, smoker, COPD EXAM: CHEST  1 VIEW COMPARISON:  Portable exam 1225 hours compared to 12/30/2022 FINDINGS: Normal heart size, mediastinal contours, and pulmonary vascularity. Atherosclerotic calcification aorta. Lungs clear. No infiltrate, pleural effusion, or pneumothorax. IMPRESSION: No acute abnormalities.  Aortic Atherosclerosis (ICD10-I70.0). Electronically Signed   By: Lavonia Dana M.D.   On: 01/29/2023 12:46    Procedures Procedures    Medications Ordered in ED Medications  ondansetron (ZOFRAN-ODT) disintegrating tablet 4 mg (has no administration in time range)  furosemide (LASIX) injection 40 mg (40 mg Intravenous Given 01/29/23 1613)  HYDROcodone-acetaminophen (NORCO/VICODIN) 5-325 MG per tablet 2 tablet (2 tablets Oral Given 01/29/23 1620)    ED Course/ Medical Decision Making/ A&P                             Medical Decision Making Patient complains of redness and swelling to bilateral lower legs patient has a history of venous stasis  Amount and/or Complexity of Data Reviewed External Data Reviewed: notes.    Details: Primary care notes reviewed patient recently was restarted on Lasix Labs: ordered. Decision-making details documented in ED Course.    Details: Labs ordered reviewed and interpreted sodium is 132 potassium is 3.8 Radiology: ordered and independent interpretation performed. Decision-making details documented in ED Course.    Details: X-ray no acute cardiopulmonary disease  Risk Prescription drug management. Risk Details: Patient given IV Lasix 40 mg.  Patient reports urinating once.  Patient has decreased swelling with elevating legs.  I counseled patient on the importance of moving lower extremities I advised her that she needs to wear compression stockings to prevent edema from building up.  Patient is advised to continue Lasix at home I feel like patient is stable for discharge she can continue to try outpatient treatment.  Patient is advised to return to the emergency department if symptoms worsen or change           Final Clinical Impression(s) / ED Diagnoses Final diagnoses:  Venous stasis of both lower extremities  Edema, unspecified type    Rx / DC Orders ED Discharge Orders     None      An After Visit Summary was printed and given to  the patient.    Fransico Meadow, [place] 01/29/23 1839    Valarie Merino, MD 01/30/23 [phone]

## 2023-01-29 NOTE — Discharge Instructions (Addendum)
You need to elevate your leg.  Take your lasix tomorrow.  You need to wear Compression socks.

## 2023-01-29 NOTE — ED Provider Triage Note (Signed)
Emergency Medicine Provider Triage Evaluation Note  Mary Gray , a 62 y.o. female  was evaluated in triage.  Pt complains of swelling in both legs.  Pt recently had lasix reduced   Review of Systems  Positive: Leg redness Negative: Cough  no shortness of breath  Physical Exam  BP (!) 121/103   Pulse 92   Temp 98.5 F (36.9 C) (Oral)   Resp 16   LMP 01/17/2009   SpO2 99%  Gen:   Awake, no distress   Resp:  Normal effort  MSK:   Moves extremities without difficulty leg swollen and red,  Other:    Medical Decision Making  Medically screening exam initiated at 11:56 AM.  Appropriate orders placed.  JAKLYN SOUFFRANT was informed that the remainder of the evaluation will be completed by another provider, this initial triage assessment does not replace that evaluation, and the importance of remaining in the ED until their evaluation is complete.     Fransico Meadow, [place] 01/29/23 1158

## 2023-01-29 NOTE — ED Provider Notes (Signed)
[name]    CSN: [id number] Arrival date & time: 01/29/23  0932      History   Chief Complaint Chief Complaint  Patient presents with   Leg Pain    HPI Mary Gray is a 62 y.o. female.   Patient is here for leg pain and swelling.  She was seen by her pcp for this last week, but has worsened.  She has been on lasix '40mg'$  daily for edema, which had to be stopped due to hyponatremia.  She then developed swelling and saw her pcp.  She was restarted on '20mg'$  lasix last week, but this has not helped.  She states that the swelling worsened, no longer just her lower legs but now up the knees and into the thighs.  This is painful.  Unable to really walk, painful to sit/drive.  Denies any sob or chest pain per se, but does have tingling in the right arm at times.  She does have an apt with cardiology next month.  She still has slight redness to the lower shins.  She was given a steroid cream, but thought that made it worse.  She did not use this yesterday as it made it hot/burning/blister up.         Past Medical History:  Diagnosis Date   Asthma    Back pain    Chronic interstitial cystitis    Followed by Dr. Franchot Gallo   COPD (chronic obstructive pulmonary disease) ([place])    DDD (degenerative disc disease), lumbar    Degeneration of cervical intervertebral disc    Depression    Diabetes mellitus, type 2 ([place]) 2007   Heme positive stool    EGD on 04/08/2010 by Dr. Watt Climes showed chronic gastritis and a few gastric polyps; pathology showed a fundic gland polyp.  Colonoscopy on 04/08/2010 showed small external and internal hemorrhoids, and a few benign-appearing diminutive polyps in the rectum, the distal sigmoid colon, and in the distal descending colon; pathology showed hyperplastic polyps.    Hypertension    Hypothyroidism    Iron deficiency anemia    Spinal stenosis     Patient Active Problem List   Diagnosis Date Noted   Acute respiratory failure with  hypoxia ([place])    COVID-19 virus infection 01/20/2022   Lumbar spondylosis 03/22/2021   Chronic radicular lumbar pain 03/22/2021   Bilateral primary osteoarthritis of knee 03/22/2021   Chronic pain syndrome 03/22/2021   Tobacco use disorder 03/22/2021   Hyponatremia 03/04/2021   Chronic bilateral low back pain without sciatica 12/13/2020   Sleep disturbance 12/13/2020   Vaginitis 10/27/2020   Right ear pain 10/13/2020   Nausea 02/10/2020   B12 deficiency 11/25/2018   Urinary, incontinence, stress female 09/09/2018   Primary osteoarthritis of right knee 05/01/2018   Chronic venous insufficiency 02/26/2017   Candidal skin infection 03/03/2014   Preventative health care 05/14/2013   Spinal stenosis, lumbar region, with neurogenic claudication 03/01/2011   Hypothyroidism 07/06/2006   Diabetes mellitus type 2, uncontrolled 07/06/2006   Hyperlipemia 07/06/2006   Generalized anxiety disorder 07/06/2006   Essential hypertension 07/06/2006   GERD 07/06/2006   Lumbar degenerative disc disease 07/06/2006    Past Surgical History:  Procedure Laterality Date   CARPAL TUNNEL RELEASE  05/08/2000   By Dr. Youlanda Mighty. Sypher, Brooke Bonito.    OB History   No obstetric history on file.      Home Medications    Prior to Admission medications   Medication Sig Start  Date End Date Taking? Authorizing Provider  Accu-Chek Softclix Lancets lancets Check blood sugar 1 time per day 07/12/20   Axel Filler, MD  acetaminophen (TYLENOL) 500 MG tablet Take 1,000 mg by mouth every 6 (six) hours as needed for mild pain.    [provider]  albuterol (PROVENTIL) (2.5 MG/3ML) 0.083% nebulizer solution Take 3 mLs (2.5 mg total) by nebulization every 6 (six) hours as needed for wheezing or shortness of breath. 01/03/23   Gildardo Pounds, NP  albuterol (VENTOLIN HFA) 108 (90 Base) MCG/ACT inhaler Inhale 1-2 puffs into the lungs every 6 (six) hours as needed for wheezing or shortness of breath. 01/22/23    Gildardo Pounds, NP  amitriptyline (ELAVIL) 25 MG tablet Take 1 tablet (25 mg total) by mouth at bedtime. 10/23/22   Gildardo Pounds, NP  amLODipine (NORVASC) 5 MG tablet Take 1 tablet (5 mg total) by mouth daily. 10/04/22 04/02/23  Argentina Donovan, PA-C  aspirin EC 81 MG tablet Take 81 mg by mouth daily. Swallow whole.    [provider]  [facility] VITAMIN B12 1000 MCG tablet TAKE 1 TABLET BY MOUTH EVERY DAY Patient taking differently: Take 1,000 mcg by mouth daily. 01/12/21   Axel Filler, MD  cyclobenzaprine (FLEXERIL) 5 MG tablet TAKE 1 TABLET BY MOUTH EVERYDAY AT BEDTIME 01/01/23   Charlott Rakes, MD  Dextromethorphan-guaiFENesin (CORICIDIN HBP CONGESTION/COUGH) 10-200 MG CAPS Take 1 Dose by mouth every 6 (six) hours as needed. 12/30/22   Allwardt, Alyssa M, PA-C  enalapril (VASOTEC) 20 MG tablet Take 1 tablet (20 mg total) by mouth daily. 10/04/22   Argentina Donovan, PA-C  esomeprazole (NEXIUM) 40 MG capsule TAKE 1 CAPSULE (40 MG TOTAL) BY MOUTH DAILY. 10/09/22 04/07/23  Charlott Rakes, MD  fluticasone-salmeterol (ADVAIR) 100-50 MCG/ACT AEPB Inhale 1 puff into the lungs 2 (two) times daily. 01/22/23   Gildardo Pounds, NP  furosemide (LASIX) 20 MG tablet Take 1 tablet (20 mg total) by mouth daily. 01/22/23 04/22/23  Gildardo Pounds, NP  gabapentin (NEURONTIN) 300 MG capsule Take 1 capsule (300 mg total) by mouth 3 (three) times daily. 11/29/22   Gildardo Pounds, NP  glucose blood (ACCU-CHEK GUIDE) test strip CHECK BLOOD SUGAR 1 TIME PER DAY 05/12/21   Axel Filler, MD  hydrOXYzine (ATARAX) 25 MG tablet TAKE 1 TABLET BY MOUTH 3 TIMES DAILY. FOR ANXIETY 06/07/22   Charlott Rakes, MD  ibuprofen (ADVIL) 200 MG tablet Take 400 mg by mouth every 6 (six) hours as needed for fever, headache or mild pain.    [provider]  levothyroxine (SYNTHROID) 88 MCG tablet Take 1 tablet (88 mcg total) by mouth daily before breakfast. 01/03/23   Gildardo Pounds, NP  metFORMIN  (GLUCOPHAGE-XR) 500 MG 24 hr tablet Take 2 tablets (1,000 mg total) by mouth 2 (two) times daily. 10/04/22   Argentina Donovan, PA-C  multivitamin-iron-minerals-folic acid (CENTRUM) chewable tablet Chew 1 tablet by mouth daily. 07/12/20   Axel Filler, MD  naloxegol oxalate (MOVANTIK) 25 MG TABS tablet Take 1 tablet (25 mg total) by mouth daily. 01/03/23   Gildardo Pounds, NP  nystatin (NYSTATIN) powder Apply 1 application topically 3 (three) times daily. 03/10/21   Harvie Heck, MD  nystatin cream (MYCOSTATIN) Apply 1 Application topically 2 (two) times daily. 01/03/23   Gildardo Pounds, NP  ondansetron (ZOFRAN) 4 MG tablet Take 1 tablet (4 mg total) by mouth every 8 (eight) hours as needed for  nausea or vomiting. 01/03/23   Gildardo Pounds, NP  PREMARIN vaginal cream PLACE 1 APPLICATORFUL VAGINALLY DAILY. Patient taking differently: 1 applicator 2 (two) times a week. 11/23/21   Axel Filler, MD  promethazine-dextromethorphan (PROMETHAZINE-DM) 6.25-15 MG/5ML syrup Take 5 mLs by mouth 4 (four) times daily as needed for cough. 05/16/22   Gildardo Pounds, NP  rosuvastatin (CRESTOR) 20 MG tablet Take 1 tablet (20 mg total) by mouth daily. 10/04/22   Argentina Donovan, PA-C  Tiotropium Bromide Monohydrate (SPIRIVA RESPIMAT) 2.5 MCG/ACT AERS Inhale 2 puffs into the lungs daily. 12/30/22   Allwardt, Randa Evens, PA-C  triamcinolone (KENALOG) 0.025 % ointment Apply 1 Application topically 2 (two) times daily. 01/22/23   Gildardo Pounds, NP  zolpidem (AMBIEN) 5 MG tablet Take 1 tablet (5 mg total) by mouth at bedtime. 01/22/23   Gildardo Pounds, NP    Family History Family History  Problem Relation Age of Onset   Heart attack Father 74   Diabetes Mother    Hypertension Mother    Diabetes Sister    Breast cancer Neg Hx    Colon cancer Neg Hx     Social History Social History   Tobacco Use   Smoking status: Some Days    Packs/day: 0.50    Years: 45.00    Total pack years: 22.50     Types: Cigarettes   Smokeless tobacco: Never   Tobacco comments:    1/2 pack per day  Vaping Use   Vaping Use: Never used  Substance Use Topics   Alcohol use: No   Drug use: No     Allergies   Amoxicillin, Codeine sulfate, Meperidine and related, Minocycline hcl, Penicillins, Propoxyphene n-acetaminophen, and Tetracycline hcl   Review of Systems Review of Systems  Constitutional: Negative.   HENT: Negative.    Respiratory: Negative.    Cardiovascular: Negative.   Gastrointestinal: Negative.   Genitourinary: Negative.   Psychiatric/Behavioral: Negative.       Physical Exam Triage Vital Signs ED Triage Vitals  Enc Vitals Group     BP 01/29/23 1002 129/80     Pulse Rate 01/29/23 1002 97     Resp 01/29/23 1002 18     Temp 01/29/23 1002 98 F (36.7 C)     Temp Source 01/29/23 1002 Oral     SpO2 01/29/23 1002 97 %     Weight --      Height --      Head Circumference --      Peak Flow --      Pain Score 01/29/23 1003 10     Pain Loc --      Pain Edu? --      Excl. in [place]? --    No data found.  Updated Vital Signs BP 129/80 (BP Location: Left Arm)   Pulse 97   Temp 98 F (36.7 C) (Oral)   Resp 18   LMP 01/17/2009   SpO2 97%   Visual Acuity Right Eye Distance:   Left Eye Distance:   Bilateral Distance:    Right Eye Near:   Left Eye Near:    Bilateral Near:     Physical Exam Constitutional:      Appearance: Normal appearance.  Cardiovascular:     Rate and Rhythm: Normal rate and regular rhythm.     Comments: She has 2+ edema up to her mid thighs;  the skin is very taught, and tender from the ankles up;  There is redness/erythema to the lower shins bilaterally, but no open sores/lesions noted Pulmonary:     Effort: Pulmonary effort is normal.     Breath sounds: Wheezing and rhonchi present.  Skin:    General: Skin is warm.  Neurological:     General: No focal deficit present.     Mental Status: She is alert.  Psychiatric:        Mood and  Affect: Mood normal.      UC Treatments / Results  Labs (all labs ordered are listed, but only abnormal results are displayed) Labs Reviewed - No data to display  EKG   Radiology No results found.  Procedures Procedures (including critical care time)  Medications Ordered in UC Medications  ketorolac (TORADOL) 30 MG/ML injection 30 mg (30 mg Intramuscular Given 01/29/23 1032)    Initial Impression / Assessment and Plan / UC Course  I have reviewed the triage vital signs and the nursing notes.  Pertinent labs & imaging results that were available during my care of the patient were reviewed by me and considered in my medical decision making (see chart for details).    Patient was seen today for worsening pain and swelling of her legs, now up to her thighs bilaterally.   Denies any sob or cp at this time.  Discussed that given her previous hyponatremia, I believe she needs to be diuresed and have labs done for monitoring.  She was instructed to go to the ER for further evaluation.   Final Clinical Impressions(s) / UC Diagnoses   Final diagnoses:  Edema, unspecified type  Bilateral leg pain     Discharge Instructions      Given your worsening swelling you need to go to the ER for further evaluation.     ED Prescriptions   None    PDMP not reviewed this encounter.   Rondel Oh, MD 01/29/23 1042

## 2023-01-29 NOTE — ED Notes (Signed)
Patient is being discharged from the Urgent Care and sent to the Emergency Department via POV. Per Dr. Eustace Moore, patient is in need of higher level of care due to leg pain and sweling. Patient is aware and verbalizes understanding of plan of care.  Vitals:   01/29/23 1002  BP: 129/80  Pulse: 97  Resp: 18  Temp: 98 F (36.7 C)  SpO2: 97%

## 2023-01-29 NOTE — ED Triage Notes (Signed)
Pt c/o bilateral leg pain, swelling, and redness for over a week. States went to her PCP last Monday and was given cream with no relief. States the redness and swelling is worse.

## 2023-01-29 NOTE — Discharge Instructions (Signed)
Given your worsening swelling you need to go to the ER for further evaluation.

## 2023-01-30 ENCOUNTER — Telehealth: Payer: Self-pay

## 2023-01-30 ENCOUNTER — Telehealth: Payer: Self-pay | Admitting: Nurse Practitioner

## 2023-01-30 ENCOUNTER — Other Ambulatory Visit: Payer: Self-pay | Admitting: Nurse Practitioner

## 2023-01-30 ENCOUNTER — Ambulatory Visit: Payer: Self-pay

## 2023-01-30 MED ORDER — GABAPENTIN 600 MG PO TABS: 600.0000 mg | ORAL_TABLET | Freq: Three times a day (TID) | ORAL | 1 refills | Status: DC

## 2023-01-30 NOTE — Telephone Encounter (Signed)
Copied from [place] [id number]. Topic: General - Other >> Jan 30, 2023  3:42 PM Oley Balm E wrote: Reason for CRM: Pt called upset that she has not heard back from the office. According to her chart, all messages have been routed appropriately to her PCP. High priority. I advised the patient we are just waiting for response from her PCP that is still seeing other patients with appts.

## 2023-01-30 NOTE — Telephone Encounter (Signed)
Copied from [place] [id number]. Topic: General - Other >> Jan 30, 2023  4:50 PM Ja-Kwan M wrote: Reason for CRM: Pt returned call to the office. Pt requests call back at [phone]

## 2023-01-30 NOTE — Telephone Encounter (Addendum)
       Chief Complaint: Had blisters 2 days ago,feels it is from Triamcinolone. better now. Swelling has increased to up to knees. Seen in ED yesterday for pain. Asking what PCP thinks she needs to do next. Symptoms: Above Frequency:  Pertinent Negatives: Patient denies  Disposition: [] ED /[] Urgent Care (no appt availability in office) / [] Appointment(In office/virtual)/ []  [place] [facility]/ [] Home Care/ [] Refused Recommended Disposition /[] [place] [facility]/ [x]  Follow-up with PCP Additional Notes: Please advise pt.  Answer Assessment - Initial Assessment Questions 1. NAME of MEDICINE: "What medicine(s) are you calling about?"     Triamcinolone cream 2. QUESTION: "What is your question?" (e.g., double dose of medicine, side effect)     Causing blisters to both lower legs 3. PRESCRIBER: "Who prescribed the medicine?" Reason: if prescribed by specialist, call should be referred to that group.     [place] 4. SYMPTOMS: "Do you have any symptoms?" If Yes, ask: "What symptoms are you having?"  "How bad are the symptoms (e.g., mild, moderate, severe)     Blisters 5. PREGNANCY:  "Is there any chance that you are pregnant?" "When was your last menstrual period?"     No  Protocols used: Medication Question Call-A-AH

## 2023-01-30 NOTE — Telephone Encounter (Signed)
Unable to reach patient by phone to relay results.  Unable to leave message.

## 2023-01-30 NOTE — Telephone Encounter (Signed)
Patient aware of response.  Patient then asked for a stronger pain medication and was told that her gabapentin can be increased, but nothing stronger can be given at the moment.

## 2023-01-30 NOTE — Telephone Encounter (Signed)
Copied from [place] [id number]. Topic: General - Other >> Jan 30, 2023  4:50 PM Ja-Kwan M wrote: Reason for CRM: Pt returned call to the office. Pt requests call back at [phone]

## 2023-01-30 NOTE — Telephone Encounter (Signed)
Pt called back asking to make [name] get her message. Pt heard phone call and wasn't sure if it was the office that called her.

## 2023-01-30 NOTE — Telephone Encounter (Signed)
Patient called back wanting to know what [name] said. I advised she has not reviewed the note and as soon as she reviews it, someone will call her back. She says the swelling is now above the knees to her thighs, legs are hot, went to the ED yesterday and was told to stop the cream, elevate the legs and call PCP office this morning. She says she's doing everything they tell her to do, but the pain is so bad she's crying on the phone. She says she just can't stand to go to the ED and sit again with her legs like they are, after I advised ED due to the severity of the pain and swelling. She denies SOB/CP. I advised as soon as this is reviewed by [name] someone will call with her recommendation.

## 2023-01-30 NOTE — Telephone Encounter (Signed)
[facility] HAS TRIED TO REACH OUT TO HER SEVERAL TIMES. I HAVE NO other options for her at this time aside from elevation of legs, compression stocking and call [facility] to schedule her appt!!  Placed in [facility] [address] Ph# [phone]  .

## 2023-01-30 NOTE — Telephone Encounter (Signed)
FYI

## 2023-01-30 NOTE — Telephone Encounter (Signed)
Unable to reach patient by phone to relay results.  Unable to leave message  CRM created.

## 2023-01-31 ENCOUNTER — Other Ambulatory Visit: Payer: Self-pay | Admitting: Nurse Practitioner

## 2023-01-31 ENCOUNTER — Ambulatory Visit (INDEPENDENT_AMBULATORY_CARE_PROVIDER_SITE_OTHER): Payer: Medicare HMO

## 2023-01-31 ENCOUNTER — Ambulatory Visit (HOSPITAL_COMMUNITY)
Admission: EM | Admit: 2023-01-31 | Discharge: 2023-01-31 | Disposition: A | Discharge status: Home / Self Care | Payer: Medicare HMO | Attending: Family Medicine | Admitting: Family Medicine

## 2023-01-31 ENCOUNTER — Telehealth: Payer: Self-pay

## 2023-01-31 ENCOUNTER — Encounter (HOSPITAL_COMMUNITY): Payer: Self-pay

## 2023-01-31 DIAGNOSIS — J4521 Mild intermittent asthma with (acute) exacerbation: Secondary | ICD-10-CM

## 2023-01-31 DIAGNOSIS — R600 Localized edema: Secondary | ICD-10-CM | POA: Diagnosis not present

## 2023-01-31 DIAGNOSIS — R0602 Shortness of breath: Secondary | ICD-10-CM | POA: Diagnosis not present

## 2023-01-31 LAB — CBC WITH DIFFERENTIAL/PLATELET
Abs Immature Granulocytes: 0.03 10*3/uL (ref 0.00–0.07)
Basophils Absolute: 0.1 10*3/uL (ref 0.0–0.1)
Basophils Relative: 1 %
Eosinophils Absolute: 0.3 10*3/uL (ref 0.0–0.5)
Eosinophils Relative: 4 %
HCT: 35.4 % — ABNORMAL LOW (ref 36.0–46.0)
Hemoglobin: 11.4 g/dL — ABNORMAL LOW (ref 12.0–15.0)
Immature Granulocytes: 1 %
Lymphocytes Relative: 23 %
Lymphs Abs: 1.5 10*3/uL (ref 0.7–4.0)
MCH: 29.2 pg (ref 26.0–34.0)
MCHC: 32.2 g/dL (ref 30.0–36.0)
MCV: 90.8 fL (ref 80.0–100.0)
Monocytes Absolute: 0.4 10*3/uL (ref 0.1–1.0)
Monocytes Relative: 7 %
Neutro Abs: 4 10*3/uL (ref 1.7–7.7)
Neutrophils Relative %: 64 %
Platelets: 307 10*3/uL (ref 150–400)
RBC: 3.9 MIL/uL (ref 3.87–5.11)
RDW: 13.5 % (ref 11.5–15.5)
WBC: 6.3 10*3/uL (ref 4.0–10.5)
nRBC: 0 % (ref 0.0–0.2)

## 2023-01-31 LAB — BASIC METABOLIC PANEL
Anion gap: 13 (ref 5–15)
BUN: 6 mg/dL — ABNORMAL LOW (ref 8–23)
CO2: 31 mmol/L (ref 22–32)
Calcium: 9.1 mg/dL (ref 8.9–10.3)
Chloride: 87 mmol/L — ABNORMAL LOW (ref 98–111)
Creatinine, Ser: 0.73 mg/dL (ref 0.44–1.00)
GFR, Estimated: >60 mL/min (ref >60)
Glucose, Bld: 226 mg/dL — ABNORMAL HIGH (ref 70–99)
Potassium: 3.4 mmol/L — ABNORMAL LOW (ref 3.5–5.1)
Sodium: 131 mmol/L — ABNORMAL LOW (ref 135–145)

## 2023-01-31 LAB — ALBUMIN: Albumin: 3.8 g/dL (ref 3.5–5.0)

## 2023-01-31 MED ORDER — PREDNISONE 20 MG PO TABS: 40.0000 mg | ORAL_TABLET | Freq: Every day | ORAL | 0 refills | Status: DC

## 2023-01-31 NOTE — Telephone Encounter (Signed)
This encounter was created in error - please disregard.

## 2023-01-31 NOTE — Telephone Encounter (Signed)
Routing to CMA 

## 2023-01-31 NOTE — Transitions of Care (Post Inpatient/ED Visit) (Signed)
   01/31/2023  Name: Mary Gray MRN: [id number] DOB: [birth year]  Today's TOC FU Call Status: Today's TOC FU Call Status:: Successful TOC FU Call Competed TOC FU Call Complete Date: 01/31/23  Transition Care Management Follow-up Telephone Call Date of Discharge: 01/29/23 Discharge Facility: [facility]) Type of Discharge: Emergency Department Reason for ED Visit: Other: ("venous stasis of both legs") How have you been since you were released from the hospital?: Same (Patient voices that her "legs are not any better-they are still red and swollen." She voices that she has not been able to wear compression stockings as legs are too swollen and uncomfortable.)  Items Reviewed: Medications obtained and verified?: Yes (Medications Reviewed) Any new allergies since your discharge?: No Dietary orders reviewed?: Yes Type of Diet Ordered:: low salt/heart healthy/carb modified Do you have support at home?: Yes People in Home: significant other  Home Care and Equipment/Supplies: [place] Ordered?: NA Any new equipment or medical supplies ordered?: NA  Functional Questionnaire: Do you need assistance with bathing/showering or dressing?: No Do you need assistance with meal preparation?: No Do you need assistance with eating?: No Do you have difficulty maintaining continence: No Do you need assistance with getting out of bed/getting out of a chair/moving?: No Do you have difficulty managing or taking your medications?: No  Folllow up appointments reviewed: PCP Follow-up appointment confirmed?: Yes Date of PCP follow-up appointment?: 02/14/23 (Patient wanting to see if any appts avialable to be seen sooner -care guide sent message to provider office) Follow-up Provider: [facility] Follow-up appointment confirmed?: NA Do you need transportation to your follow-up appointment?: No Do you understand care options if your condition(s) worsen?:  Yes-patient verbalized understanding  SDOH Interventions Today    Flowsheet Row Most Recent Value  SDOH Interventions   Transportation Interventions Intervention Not Indicated  [pt denies any transportation issues-states she drives and her "sig other" takes her as well]      TOC Interventions Today    Flowsheet Row Most Recent Value  TOC Interventions   TOC Interventions Discussed/Reviewed TOC Interventions Discussed, Contacted provider for patient needs, Post discharge activity limitations per provider, S/S of infection      Interventions Today    Flowsheet Row Most Recent Value  Education Interventions   Education Provided Provided Education  Provided Verbal Education On Nutrition, Medication, When to see the doctor, Other  Nutrition Interventions   Nutrition Discussed/Reviewed Nutrition Discussed  Pharmacy Interventions   Pharmacy Dicussed/Reviewed Medications and their functions, Pharmacy Topics Discussed  Safety Interventions   Safety Discussed/Reviewed Safety Discussed, Fall Risk       [facility] Care Management Care Management Community Coordinator Direct Phone: [phone] Toll Free: [phone] Fax: [phone]

## 2023-01-31 NOTE — ED Triage Notes (Signed)
Chief Complaint: Bilateral Legs Red, Swelling, Blistered, Cough. Patient having productive cough and SOB. States that legs started with just swelling, after using new cream they are red, increased swelling, and pain.   Onset: Saturday   Prescriptions or OTC medications tried: Yes- steroid cream, Nebulizer     with mild relief  Sick exposure: No  New foods, medications, or products: Yes- steroid cream on the legs.   Recent Travel: No

## 2023-01-31 NOTE — Telephone Encounter (Signed)
Pt calling back d/t needing a referral for [facility]. Called Cardiology this morning and they gave her the # to [facility] but she doesn't have a referral with them. Pt also asking can she take leftover prednisone and abx to help with her legs. She states they are red, very tender to touch, and painful and swollen. Unable to wear compression stocking right now because unable to get them on. Pt is miserable and feels like no one is helping her. Advised her I will send message back to provider for referral and have nurse FU with her on recommendations on medications. Pt verbalized understanding.

## 2023-01-31 NOTE — Discharge Instructions (Addendum)
Keep taking your Lasix as directed every morning.  Swelling happens when fluid collects in small spaces around tissues and organs inside the body. Another word for swelling is "edema." Some common parts of the body where people can have swelling are the lower legs or hands. This typically is worse in the areas of the body that are closest to the ground (because of gravity)  Symptoms of swelling can include puffiness of the skin, which can cause the skin to look stretched and shiny. This often occurs with swelling in the lower legs and can be worse after you sit or stand for a long time.  Treatment of edema includes several components: treatment of the underlying cause (if possible), reducing the amount of salt (sodium) in your diet, and, in many cases, use of a medication called a diuretic to eliminate excess fluid. Using compression stockings and elevating the legs may also be recommended.   You have had labs (blood tests) sent today. We will call you with any significant abnormalities or if there is need to begin or change treatment or pursue further follow up.  You may also review your test results online through [place]. If you do not have a MyChart account, instructions to sign up should be on your discharge paperwork.

## 2023-02-01 ENCOUNTER — Other Ambulatory Visit: Payer: Self-pay | Admitting: Nurse Practitioner

## 2023-02-01 ENCOUNTER — Other Ambulatory Visit: Payer: Self-pay | Admitting: Family Medicine

## 2023-02-01 DIAGNOSIS — G2581 Restless legs syndrome: Secondary | ICD-10-CM

## 2023-02-01 DIAGNOSIS — I872 Venous insufficiency (chronic) (peripheral): Secondary | ICD-10-CM

## 2023-02-01 DIAGNOSIS — M47816 Spondylosis without myelopathy or radiculopathy, lumbar region: Secondary | ICD-10-CM

## 2023-02-01 DIAGNOSIS — G894 Chronic pain syndrome: Secondary | ICD-10-CM

## 2023-02-01 DIAGNOSIS — M5136 Other intervertebral disc degeneration, lumbar region: Secondary | ICD-10-CM

## 2023-02-01 NOTE — Telephone Encounter (Signed)
Patient identified by name and date of birth.  Patient aware of response and voiced understanding. A new referral was put in to [facility] and patient was given new number.  Patient stated that she went to [facility] and was given a 5 day supply of prednisone.  Ms. Raul Del did advise against that and a higher dose of gabapentin was sent in.

## 2023-02-01 NOTE — ED Provider Notes (Signed)
[place]   [id number] 01/31/23 Arrival Time: [id number]  ASSESSMENT & PLAN:  1. Bilateral lower extremity edema   2. SOB (shortness of breath)   3. Mild intermittent asthma with acute exacerbation    Will ensure she is taking Lasix daily. Will need to elevate legs until swelling does down. Does have OTC compression stockings.  Significant labs reviewed; RN will notify. Should recheck BMP in a few days; can do here. Labs Reviewed  BASIC METABOLIC PANEL - Abnormal; Notable for the following components:      Result Value   Sodium 131 (*)    Potassium 3.4 (*)    Chloride 87 (*)    Glucose, Bld 226 (*)    BUN 6 (*)    All other components within normal limits  CBC WITH DIFFERENTIAL/PLATELET - Abnormal; Notable for the following components:   Hemoglobin 11.4 (*)    HCT 35.4 (*)    All other components within normal limits  ALBUMIN   No respiratory distress related to mild asthma exacerbation.  I have personally viewed and independently interpreted the imaging studies ordered this visit. CXR without acute changes.  Begin: Meds ordered this encounter  Medications   predniSONE (DELTASONE) 20 MG tablet    Sig: Take 2 tablets (40 mg total) by mouth daily.    Dispense:  10 tablet    Refill:  0   Recommend:  Follow-up Information     [place] [facility] at [facility].   Specialty: Emergency Medicine Why: If symptoms worsen in any way. Contact information: [address] [id number] [place] [phone]        Schedule an appointment as soon as possible for a visit  with Gildardo Pounds, NP.   Specialty: Nurse Practitioner Why: For follow up to discuss the swelling in your legs. Contact information: [place] [address] [phone]                 Reviewed expectations re: course of current medical issues. Questions answered. Outlined signs and symptoms  indicating need for more acute intervention. Patient verbalized understanding. After Visit Summary given.   SUBJECTIVE: History from: patient. Seen in ED on 01/29/23; notes and investigations reviewed. Mary Gray is a 62 y.o. female who presents with complaint of bilateral LE edema; long-standing; painful. Has Lasix at home but not taking regularly; confused on whether she was told she may take or not. Also reports wheezing/SOB; few days. Denies CP and fevers. Patient having productive cough and SOB. States that legs started with just swelling, after using new cream they are red, increased swelling, and pain.  Denies: irregular heart beat, near-syncope, orthopnea, palpitations, paroxysmal nocturnal dyspnea, and syncope.  Social History   Tobacco Use  Smoking Status Some Days   Packs/day: 0.50   Years: 45.00   Additional pack years: 0.00   Total pack years: 22.50   Types: Cigarettes  Smokeless Tobacco Never  Tobacco Comments   1/2 pack per day   Social History   Substance and Sexual Activity  Alcohol Use No   OBJECTIVE:  Vitals:   01/31/23 1848 01/31/23 1849  BP:  119/66  Pulse:  97  Resp:  20  Temp:  98.2 F (36.8 C)  TempSrc:  Oral  SpO2:  99%  Weight: 81.9 kg   Height: '5\' 1"'$  (1.549 m)     General appearance: alert, oriented, no acute distress Eyes: PERRLA; EOMI; conjunctivae  normal HENT: normocephalic; atraumatic Neck: supple with FROM Lungs: without labored respirations; speaks full sentences without difficulty;  mild bilat exp wheezing present Heart: regular Extremities: with bilateral 2+ pitting edema; without calf swelling or tenderness; overlying mild erythema Skin: warm and dry; without open lesions Neuro: normal gait but slow Psychological: alert and cooperative; normal mood and affect  Labs: Results for orders placed or performed during the hospital encounter of [id number]  Basic metabolic panel  Result Value Ref Range   Sodium 131 (L) 135 - 145  mmol/L   Potassium 3.4 (L) 3.5 - 5.1 mmol/L   Chloride 87 (L) 98 - 111 mmol/L   CO2 31 22 - 32 mmol/L   Glucose, Bld 226 (H) 70 - 99 mg/dL   BUN 6 (L) 8 - 23 mg/dL   Creatinine, Ser 0.73 0.44 - 1.00 mg/dL   Calcium 9.1 8.9 - 10.3 mg/dL   GFR, Estimated >60 >60 mL/min   Anion gap 13 5 - 15  CBC with Differential/Platelet  Result Value Ref Range   WBC 6.3 4.0 - 10.5 K/uL   RBC 3.90 3.87 - 5.11 MIL/uL   Hemoglobin 11.4 (L) 12.0 - 15.0 g/dL   HCT 35.4 (L) 36.0 - 46.0 %   MCV 90.8 80.0 - 100.0 fL   MCH 29.2 26.0 - 34.0 pg   MCHC 32.2 30.0 - 36.0 g/dL   RDW 13.5 11.5 - 15.5 %   Platelets 307 150 - 400 K/uL   nRBC 0.0 0.0 - 0.2 %   Neutrophils Relative % 64 %   Neutro Abs 4.0 1.7 - 7.7 K/uL   Lymphocytes Relative 23 %   Lymphs Abs 1.5 0.7 - 4.0 K/uL   Monocytes Relative 7 %   Monocytes Absolute 0.4 0.1 - 1.0 K/uL   Eosinophils Relative 4 %   Eosinophils Absolute 0.3 0.0 - 0.5 K/uL   Basophils Relative 1 %   Basophils Absolute 0.1 0.0 - 0.1 K/uL   Immature Granulocytes 1 %   Abs Immature Granulocytes 0.03 0.00 - 0.07 K/uL  Albumin  Result Value Ref Range   Albumin 3.8 3.5 - 5.0 g/dL   *Note: Due to a large number of results and/or encounters for the requested time period, some results have not been displayed. A complete set of results can be found in Results Review.   Labs Reviewed  BASIC METABOLIC PANEL - Abnormal; Notable for the following components:      Result Value   Sodium 131 (*)    Potassium 3.4 (*)    Chloride 87 (*)    Glucose, Bld 226 (*)    BUN 6 (*)    All other components within normal limits  CBC WITH DIFFERENTIAL/PLATELET - Abnormal; Notable for the following components:   Hemoglobin 11.4 (*)    HCT 35.4 (*)    All other components within normal limits  ALBUMIN    Imaging: DG Chest 2 View  Result Date: 01/31/2023 CLINICAL DATA:  Shortness of breath, lower extremity edema. EXAM: CHEST - 2 VIEW COMPARISON:  01/29/2023. FINDINGS: Clear lungs. Normal  heart size and mediastinal contours. No pleural effusion or pneumothorax. Visualized bones and upper abdomen are unremarkable. IMPRESSION: No evidence of acute cardiopulmonary disease. Electronically Signed   By: Emmit Alexanders M.D.   On: 01/31/2023 19:42     Allergies  Allergen Reactions   Amoxicillin Other (See Comments)   Codeine Sulfate Other (See Comments)    Confusion, anxiety, fatigue   Meperidine And Related Nausea  And Vomiting   Minocycline Hcl Nausea And Vomiting    Hospitalized for vomiting along with allergic reaction   Penicillins Other (See Comments)    Has patient had a PCN reaction causing immediate rash, facial/tongue/throat swelling, SOB or lightheadedness with hypotension: Yes Has patient had a PCN reaction causing severe rash involving mucus membranes or skin necrosis: Yes Has patient had a PCN reaction that required hospitalization Yes Has patient had a PCN reaction occurring within the last 10 years: Yes If all of the above answers are "NO", then may proceed with Cephalosporin use.    Propoxyphene N-Acetaminophen Other (See Comments)    confused   Tetracycline Hcl Other (See Comments)    Vomiting Note: Patient took a course of doxycycline in June 2014 without any problems.    Past Medical History:  Diagnosis Date   Asthma    Back pain    Chronic interstitial cystitis    Followed by Dr. Franchot Gallo   COPD (chronic obstructive pulmonary disease) ([place])    DDD (degenerative disc disease), lumbar    Degeneration of cervical intervertebral disc    Depression    Diabetes mellitus, type 2 ([place]) 2007   Heme positive stool    EGD on 04/08/2010 by Dr. Watt Climes showed chronic gastritis and a few gastric polyps; pathology showed a fundic gland polyp.  Colonoscopy on 04/08/2010 showed small external and internal hemorrhoids, and a few benign-appearing diminutive polyps in the rectum, the distal sigmoid colon, and in the distal descending colon; pathology showed  hyperplastic polyps.    Hypertension    Hypothyroidism    Iron deficiency anemia    Spinal stenosis    Social History   Socioeconomic History   Marital status: Single    Spouse name: Not on file   Number of children: Not on file   Years of education: Not on file   Highest education level: Not on file  Occupational History   Not on file  Tobacco Use   Smoking status: Some Days    Packs/day: 0.50    Years: 45.00    Additional pack years: 0.00    Total pack years: 22.50    Types: Cigarettes   Smokeless tobacco: Never   Tobacco comments:    1/2 pack per day  Vaping Use   Vaping Use: Never used  Substance and Sexual Activity   Alcohol use: No   Drug use: No   Sexual activity: Not Currently  Other Topics Concern   Not on file  Social History Narrative   Not on file   Social Determinants of Health   Financial Resource Strain: Medium Risk (01/16/2023)   Overall Financial Resource Strain (CARDIA)    Difficulty of Paying Living Expenses: Somewhat hard  Food Insecurity: Food Insecurity Present (01/16/2023)   Hunger Vital Sign    Worried About Running Out of Food in the Last Year: Often true    Ran Out of Food in the Last Year: Often true  Transportation Needs: No Transportation Needs (01/31/2023)   PRAPARE - Hydrologist (Medical): No    Lack of Transportation (Non-Medical): No  Recent Concern: Transportation Needs - Unmet Transportation Needs (01/16/2023)   PRAPARE - Transportation    Lack of Transportation (Medical): Yes    Lack of Transportation (Non-Medical): Yes  Physical Activity: Sufficiently Active (01/16/2023)   Exercise Vital Sign    Days of Exercise per Week: 5 days    Minutes of Exercise per Session: 30 min  Stress: Stress Concern Present (01/16/2023)   [place]    Feeling of Stress : To some extent  Social Connections: Moderately Isolated (01/16/2023)   Social  Connection and Isolation Panel [NHANES]    Frequency of Communication with Friends and Family: More than three times a week    Frequency of Social Gatherings with Friends and Family: Once a week    Attends Religious Services: Never    Marine scientist or Organizations: No    Attends Archivist Meetings: Never    Marital Status: Living with partner  Intimate Partner Violence: Not At Risk (01/16/2023)   Humiliation, Afraid, Rape, and Kick questionnaire    Fear of Current or Ex-Partner: No    Emotionally Abused: No    Physically Abused: No    Sexually Abused: No   Family History  Problem Relation Age of Onset   Heart attack Father 43   Diabetes Mother    Hypertension Mother    Diabetes Sister    Breast cancer Neg Hx    Colon cancer Neg Hx    Past Surgical History:  Procedure Laterality Date   CARPAL TUNNEL RELEASE  05/08/2000   By Dr. Youlanda Mighty. Sypher, Brooke Bonito.      Vanessa Kick, MD 02/01/23 1327

## 2023-02-05 ENCOUNTER — Ambulatory Visit (HOSPITAL_COMMUNITY)
Admission: RE | Admit: 2023-02-05 | Discharge: 2023-02-05 | Disposition: A | Discharge status: Home / Self Care | Payer: Medicare HMO | Source: Ambulatory Visit | Attending: Emergency Medicine | Admitting: Emergency Medicine

## 2023-02-05 ENCOUNTER — Telehealth (HOSPITAL_COMMUNITY): Payer: Self-pay | Admitting: Emergency Medicine

## 2023-02-05 ENCOUNTER — Other Ambulatory Visit: Payer: Self-pay

## 2023-02-05 ENCOUNTER — Encounter (HOSPITAL_COMMUNITY): Payer: Self-pay

## 2023-02-05 VITALS — BP 145/82 | HR 102 | Temp 98.3°F | Resp 19

## 2023-02-05 DIAGNOSIS — I878 Other specified disorders of veins: Secondary | ICD-10-CM | POA: Diagnosis not present

## 2023-02-05 LAB — BASIC METABOLIC PANEL
Anion gap: 11 (ref 5–15)
BUN: 13 mg/dL (ref 8–23)
CO2: 31 mmol/L (ref 22–32)
Calcium: 9 mg/dL (ref 8.9–10.3)
Chloride: 88 mmol/L — ABNORMAL LOW (ref 98–111)
Creatinine, Ser: 0.82 mg/dL (ref 0.44–1.00)
GFR, Estimated: >60 mL/min (ref >60)
Glucose, Bld: 262 mg/dL — ABNORMAL HIGH (ref 70–99)
Potassium: 4.2 mmol/L (ref 3.5–5.1)
Sodium: 130 mmol/L — ABNORMAL LOW (ref 135–145)

## 2023-02-05 MED ORDER — KETOROLAC TROMETHAMINE 30 MG/ML IJ SOLN
30.0000 mg | Freq: Once | INTRAMUSCULAR | Status: AC
  Administered 2023-02-05: 30 mg via INTRAMUSCULAR

## 2023-02-05 MED ORDER — KETOROLAC TROMETHAMINE 30 MG/ML IJ SOLN
INTRAMUSCULAR | Status: AC
  Filled 2023-02-05: qty 1

## 2023-02-05 NOTE — ED Provider Notes (Signed)
[place]    CSN: [id number] Arrival date & time: 02/05/23  1551      History   Chief Complaint Chief Complaint  Patient presents with   Leg Pain   Back Pain    HPI Mary Gray is a 62 y.o. female.  Here with continued bilateral leg swelling. Chronic.  Seen in ED 3/11 and then here on 3/13. Was given short prednisone course with some improvement  Was recommended to take lasix, elevate the legs, and wear compression stockings. However she has not done this Was advised to return for recheck BMP (was 3.4)  Per chart review vein and vascular has tried to contact her several times for follow up.  No shortness of breath, trouble breathing, palpitations, unilateral pain/swelling She is a current smoker.   Past Medical History:  Diagnosis Date   Asthma    Back pain    Chronic interstitial cystitis    Followed by Dr. Franchot Gallo   COPD (chronic obstructive pulmonary disease) ([place])    DDD (degenerative disc disease), lumbar    Degeneration of cervical intervertebral disc    Depression    Diabetes mellitus, type 2 ([place]) 2007   Heme positive stool    EGD on 04/08/2010 by Dr. Watt Climes showed chronic gastritis and a few gastric polyps; pathology showed a fundic gland polyp.  Colonoscopy on 04/08/2010 showed small external and internal hemorrhoids, and a few benign-appearing diminutive polyps in the rectum, the distal sigmoid colon, and in the distal descending colon; pathology showed hyperplastic polyps.    Hypertension    Hypothyroidism    Iron deficiency anemia    Spinal stenosis     Patient Active Problem List   Diagnosis Date Noted   Acute respiratory failure with hypoxia ([place])    COVID-19 virus infection 01/20/2022   Lumbar spondylosis 03/22/2021   Chronic radicular lumbar pain 03/22/2021   Bilateral primary osteoarthritis of knee 03/22/2021   Chronic pain syndrome 03/22/2021   Tobacco use disorder 03/22/2021   Hyponatremia 03/04/2021   Chronic  bilateral low back pain without sciatica 12/13/2020   Sleep disturbance 12/13/2020   Vaginitis 10/27/2020   Right ear pain 10/13/2020   Nausea 02/10/2020   B12 deficiency 11/25/2018   Urinary, incontinence, stress female 09/09/2018   Primary osteoarthritis of right knee 05/01/2018   Chronic venous insufficiency 02/26/2017   Candidal skin infection 03/03/2014   Preventative health care 05/14/2013   Spinal stenosis, lumbar region, with neurogenic claudication 03/01/2011   Hypothyroidism 07/06/2006   Diabetes mellitus type 2, uncontrolled 07/06/2006   Hyperlipemia 07/06/2006   Generalized anxiety disorder 07/06/2006   Essential hypertension 07/06/2006   GERD 07/06/2006   Lumbar degenerative disc disease 07/06/2006    Past Surgical History:  Procedure Laterality Date   CARPAL TUNNEL RELEASE  05/08/2000   By Dr. Youlanda Mighty. Sypher, Brooke Bonito.    OB History   No obstetric history on file.      Home Medications    Prior to Admission medications   Medication Sig Start Date End Date Taking? Authorizing Provider  Accu-Chek Softclix Lancets lancets Check blood sugar 1 time per day 07/12/20   Axel Filler, MD  acetaminophen (TYLENOL) 500 MG tablet Take 1,000 mg by mouth every 6 (six) hours as needed for mild pain.    [provider]  albuterol (PROVENTIL) (2.5 MG/3ML) 0.083% nebulizer solution Take 3 mLs (2.5 mg total) by nebulization every 6 (six) hours as needed for wheezing or shortness of breath. 01/03/23  Gildardo Pounds, NP  albuterol (VENTOLIN HFA) 108 (90 Base) MCG/ACT inhaler Inhale 1-2 puffs into the lungs every 6 (six) hours as needed for wheezing or shortness of breath. 01/22/23   Gildardo Pounds, NP  amitriptyline (ELAVIL) 25 MG tablet Take 1 tablet (25 mg total) by mouth at bedtime. 10/23/22   Gildardo Pounds, NP  amLODipine (NORVASC) 5 MG tablet Take 1 tablet (5 mg total) by mouth daily. 10/04/22 04/02/23  Argentina Donovan, PA-C  aspirin EC 81 MG tablet Take 81  mg by mouth daily. Swallow whole.    [provider]  [facility] VITAMIN B12 1000 MCG tablet TAKE 1 TABLET BY MOUTH EVERY DAY Patient taking differently: Take 1,000 mcg by mouth daily. 01/12/21   Axel Filler, MD  cyclobenzaprine (FLEXERIL) 5 MG tablet TAKE 1 TABLET BY MOUTH EVERYDAY AT BEDTIME 02/01/23   Gildardo Pounds, NP  Dextromethorphan-guaiFENesin (CORICIDIN HBP CONGESTION/COUGH) 10-200 MG CAPS Take 1 Dose by mouth every 6 (six) hours as needed. 12/30/22   Allwardt, Alyssa M, PA-C  enalapril (VASOTEC) 20 MG tablet Take 1 tablet (20 mg total) by mouth daily. 10/04/22   Argentina Donovan, PA-C  esomeprazole (NEXIUM) 40 MG capsule TAKE 1 CAPSULE (40 MG TOTAL) BY MOUTH DAILY. 10/09/22 04/07/23  Charlott Rakes, MD  fluticasone-salmeterol (ADVAIR) 100-50 MCG/ACT AEPB Inhale 1 puff into the lungs 2 (two) times daily. 01/22/23   Gildardo Pounds, NP  furosemide (LASIX) 20 MG tablet Take 1 tablet (20 mg total) by mouth daily. 01/22/23 04/22/23  Gildardo Pounds, NP  gabapentin (NEURONTIN) 600 MG tablet Take 1 tablet (600 mg total) by mouth 3 (three) times daily. 01/30/23   Gildardo Pounds, NP  glucose blood (ACCU-CHEK GUIDE) test strip CHECK BLOOD SUGAR 1 TIME PER DAY 05/12/21   Axel Filler, MD  hydrOXYzine (ATARAX) 25 MG tablet TAKE 1 TABLET BY MOUTH 3 TIMES DAILY. FOR ANXIETY 06/07/22   Charlott Rakes, MD  ibuprofen (ADVIL) 200 MG tablet Take 400 mg by mouth every 6 (six) hours as needed for fever, headache or mild pain.    [provider]  levothyroxine (SYNTHROID) 88 MCG tablet Take 1 tablet (88 mcg total) by mouth daily before breakfast. 01/03/23   Gildardo Pounds, NP  metFORMIN (GLUCOPHAGE-XR) 500 MG 24 hr tablet Take 2 tablets (1,000 mg total) by mouth 2 (two) times daily. 10/04/22   Argentina Donovan, PA-C  multivitamin-iron-minerals-folic acid (CENTRUM) chewable tablet Chew 1 tablet by mouth daily. 07/12/20   Axel Filler, MD  naloxegol oxalate (MOVANTIK) 25 MG  TABS tablet Take 1 tablet (25 mg total) by mouth daily. 01/03/23   Gildardo Pounds, NP  nystatin (NYSTATIN) powder Apply 1 application topically 3 (three) times daily. 03/10/21   Harvie Heck, MD  nystatin cream (MYCOSTATIN) Apply 1 Application topically 2 (two) times daily. 01/03/23   Gildardo Pounds, NP  ondansetron (ZOFRAN) 4 MG tablet Take 1 tablet (4 mg total) by mouth every 8 (eight) hours as needed for nausea or vomiting. 01/03/23   Gildardo Pounds, NP  predniSONE (DELTASONE) 20 MG tablet Take 2 tablets (40 mg total) by mouth daily. 01/31/23   Vanessa Kick, MD  PREMARIN vaginal cream PLACE 1 APPLICATORFUL VAGINALLY DAILY. Patient taking differently: 1 applicator 2 (two) times a week. 11/23/21   Axel Filler, MD  promethazine-dextromethorphan (PROMETHAZINE-DM) 6.25-15 MG/5ML syrup Take 5 mLs by mouth 4 (four) times daily as needed for cough. 05/16/22   Geryl Rankins  W, NP  rosuvastatin (CRESTOR) 20 MG tablet Take 1 tablet (20 mg total) by mouth daily. 10/04/22   Argentina Donovan, PA-C  Tiotropium Bromide Monohydrate (SPIRIVA RESPIMAT) 2.5 MCG/ACT AERS Inhale 2 puffs into the lungs daily. 12/30/22   Allwardt, Randa Evens, PA-C  triamcinolone (KENALOG) 0.025 % ointment Apply 1 Application topically 2 (two) times daily. 01/22/23   Gildardo Pounds, NP  zolpidem (AMBIEN) 5 MG tablet Take 1 tablet (5 mg total) by mouth at bedtime. 01/22/23   Gildardo Pounds, NP    Family History Family History  Problem Relation Age of Onset   Heart attack Father 43   Diabetes Mother    Hypertension Mother    Diabetes Sister    Breast cancer Neg Hx    Colon cancer Neg Hx     Social History Social History   Tobacco Use   Smoking status: Some Days    Packs/day: 0.50    Years: 45.00    Additional pack years: 0.00    Total pack years: 22.50    Types: Cigarettes   Smokeless tobacco: Never   Tobacco comments:    1/2 pack per day  Vaping Use   Vaping Use: Never used  Substance Use Topics   Alcohol  use: No   Drug use: No     Allergies   Amoxicillin, Codeine sulfate, Meperidine and related, Minocycline hcl, Penicillins, Propoxyphene n-acetaminophen, and Tetracycline hcl   Review of Systems Review of Systems As per HPI  Physical Exam Triage Vital Signs ED Triage Vitals [02/05/23 1612]  Enc Vitals Group     BP      Pulse      Resp      Temp      Temp src      SpO2      Weight      Height      Head Circumference      Peak Flow      Pain Score 9     Pain Loc      Pain Edu?      Excl. in [place]?    No data found.  Updated Vital Signs BP (!) 145/82   Pulse (!) 102   Temp 98.3 F (36.8 C)   Resp 19   LMP 01/17/2009   SpO2 92%   Physical Exam Vitals and nursing note reviewed.  Constitutional:      General: She is not in acute distress.    Appearance: She is not ill-appearing.     Comments: Cigarette smell in patient room  HENT:     Mouth/Throat:     Pharynx: Oropharynx is clear.  Cardiovascular:     Rate and Rhythm: Normal rate and regular rhythm.     Heart sounds: Normal heart sounds.     Comments: Edema ends at knees. Not pitting. No calf tenderness Pulmonary:     Effort: Pulmonary effort is normal. No respiratory distress.     Breath sounds: Normal breath sounds. No rhonchi or rales.  Musculoskeletal:     Right lower leg: 2+ Edema present.     Left lower leg: Tenderness present. 2+ Edema present.     Comments: Ambulates with cane at baseline  Skin:    General: Skin is warm and dry.     Findings: No erythema or rash.  Neurological:     Mental Status: She is alert and oriented to person, place, and time.    UC Treatments / Results  Labs (  all labs ordered are listed, but only abnormal results are displayed) Labs Reviewed  BASIC METABOLIC PANEL - Abnormal; Notable for the following components:      Result Value   Sodium 130 (*)    Chloride 88 (*)    Glucose, Bld 262 (*)    All other components within normal limits    EKG  Radiology No  results found.  Procedures Procedures   Medications Ordered in UC Medications  ketorolac (TORADOL) 30 MG/ML injection 30 mg (30 mg Intramuscular Given 02/05/23 1722)    Initial Impression / Assessment and Plan / UC Course  I have reviewed the triage vital signs and the nursing notes.  Pertinent labs & imaging results that were available during my care of the patient were reviewed by me and considered in my medical decision making (see chart for details).  IM toradol given for pain per patient request. This has helped her in the past.  Repeat BMP today with potassium normalized; 4.2 Advised to take lasix 40 mg daily. Elevate legs as much as possible. Discussed this means elevating more often than not to reduce swelling. Then she will be able to wear compression stockings Discussed need for follow up with V&V. Will call first thing in AM to set up appointment   Patient was not aware smoking can worsen her symptoms. Advised to quit and follow with PCP to discuss smoking cessation   Final Clinical Impressions(s) / UC Diagnoses   Final diagnoses:  Venous stasis of both lower extremities     Discharge Instructions      ELEVATE your legs as much as possible every day and every night Wear COMPRESSION stockings all day Continue LASIX daily Continue tylenol and gabapentin if these are helpful  Call the vein and vascular center first thing tomorrow morning to set up appointment.      ED Prescriptions   None    PDMP not reviewed this encounter.   [place], [place] 02/05/23 1749

## 2023-02-05 NOTE — Discharge Instructions (Addendum)
ELEVATE your legs as much as possible every day and every night Wear COMPRESSION stockings all day Continue LASIX daily Continue tylenol and gabapentin if these are helpful  Call the vein and vascular center first thing tomorrow morning to set up appointment.

## 2023-02-05 NOTE — ED Triage Notes (Signed)
PT last seen on 01-31-23 leg pain the patient reports she had a TC that reported her potassium was low. Pt still has leg pain and leg swelling.

## 2023-02-05 NOTE — Telephone Encounter (Signed)
Patient called and we were able to review recent lab work, all questions answered, f/u discussed

## 2023-02-06 ENCOUNTER — Telehealth (HOSPITAL_COMMUNITY): Payer: Self-pay | Admitting: Emergency Medicine

## 2023-02-06 ENCOUNTER — Telehealth: Payer: Self-pay | Admitting: Emergency Medicine

## 2023-02-06 ENCOUNTER — Telehealth: Payer: Self-pay

## 2023-02-06 NOTE — Telephone Encounter (Signed)
Copied from [place] [id number]. Topic: General - Other >> Feb 06, 2023 12:50 PM Dominique A wrote: Reason for CRM: Coralyn Mark with [employer] is calling stating that she need the order to be corrected for the pt. The pt date of birth is incorrect on the medication order and she is also needing the pt chart notes. Please call Coralyn Mark back.  Coralyn Mark is wanting to see if PCP has access to [facility] to message her back.

## 2023-02-06 NOTE — Telephone Encounter (Signed)
Date of birth changed, order re faxed with office notes.

## 2023-02-06 NOTE — Transitions of Care (Post Inpatient/ED Visit) (Signed)
   02/06/2023  Name: Mary Gray MRN: [id number] DOB: [birth year]  Today's TOC FU Call Status: Today's TOC FU Call Status:: Unsuccessul Call (1st Attempt) Unsuccessful Call (1st Attempt) Date: 02/06/23 [facility] FU Call Complete Date:  (Red on EMMI-ED Discharge Alert Date & Reason: 02/03/23-"Lost interest in things they used to enjoy? Yes")  Attempted to reach the patient regarding the most recent Inpatient/ED visit.  Follow Up Plan: Additional outreach attempts will be made to reach the patient to complete the Transitions of Care (Post Inpatient/ED visit) call.     Enzo Montgomery, RN,BSN,CCM [facility] Care Management Care Management Community Coordinator Direct Phone: [phone] Toll Free: [phone] Fax: [phone]

## 2023-02-06 NOTE — Telephone Encounter (Signed)
Have received multiple messages from patient for return call to review her labs from yesterday. I have tried returning her call 4 times now, every time I get voicemail.  I have left 2 voicemails so far.

## 2023-02-07 ENCOUNTER — Telehealth: Payer: Self-pay

## 2023-02-07 ENCOUNTER — Other Ambulatory Visit: Payer: Self-pay | Admitting: *Deleted

## 2023-02-07 DIAGNOSIS — J449 Chronic obstructive pulmonary disease, unspecified: Secondary | ICD-10-CM | POA: Diagnosis not present

## 2023-02-07 DIAGNOSIS — I872 Venous insufficiency (chronic) (peripheral): Secondary | ICD-10-CM

## 2023-02-07 NOTE — Transitions of Care (Post Inpatient/ED Visit) (Signed)
   02/07/2023  Name: CARLOYN SCHULKE MRN: [id number] DOB: [birth year]  Today's TOC FU Call Status: Today's TOC FU Call Status:: Unsuccessful Call (2nd Attempt) Unsuccessful Call (2nd Attempt) Date: 02/07/23 (Red on EMMI-ED Discharge Alert Date & Reason: 02/03/23-  "Lost interest in things they used to enjoy? Yes")  Attempted to reach the patient regarding the most recent Inpatient/ED visit.  Follow Up Plan: Additional outreach attempts will be made to reach the patient to complete the Transitions of Care (Post Inpatient/ED visit) call.     Enzo Montgomery, RN,BSN,CCM [facility] Care Management Care Management Community Coordinator Direct Phone: [phone] Toll Free: [phone] Fax: [phone]

## 2023-02-08 ENCOUNTER — Telehealth: Payer: Self-pay

## 2023-02-08 NOTE — Transitions of Care (Post Inpatient/ED Visit) (Signed)
   02/08/2023  Name: VONDRA PRITT MRN: [id number] DOB: 11-17-1961  Today's TOC FU Call Status: Today's TOC FU Call Status:: Unsuccessful Call (3rd Attempt) Unsuccessful Call (2nd Attempt) Date:  (Red on EMMI ED Discharge Alert Date &Reason: 02/03/23-'Lost interest in things you used to enjoy? Yes') Unsuccessful Call (3rd Attempt) Date: 02/08/23  Attempted to reach the patient regarding the most recent Inpatient/ED visit.  Follow Up Plan: No further outreach attempts will be made at this time. We have been unable to contact the patient.  Enzo Montgomery, RN,BSN,CCM [facility] Care Management Care Management Community Coordinator Direct Phone: [phone] Toll Free: [phone] Fax: [phone]

## 2023-02-13 ENCOUNTER — Other Ambulatory Visit: Payer: Self-pay | Admitting: Nurse Practitioner

## 2023-02-14 ENCOUNTER — Telehealth: Payer: Self-pay | Admitting: Licensed Clinical Social Worker

## 2023-02-14 ENCOUNTER — Ambulatory Visit: Payer: Medicare HMO | Admitting: Nurse Practitioner

## 2023-02-14 ENCOUNTER — Ambulatory Visit: Payer: Medicare HMO | Attending: Nurse Practitioner

## 2023-02-14 DIAGNOSIS — I10 Essential (primary) hypertension: Secondary | ICD-10-CM | POA: Diagnosis not present

## 2023-02-14 MED ORDER — PROMETHAZINE-DM 6.25-15 MG/5ML PO SYRP: 5.0000 mL | ORAL_SOLUTION | Freq: Four times a day (QID) | ORAL | 0 refills | Status: DC | PRN

## 2023-02-14 NOTE — Patient Instructions (Signed)
Visit Information  Thank you for taking time to visit with me today. Please don't hesitate to contact me if I can be of assistance to you.   Following are the goals we discussed today:   Goals Addressed             This Visit's Progress    Obtain Supportive Resources   On track    Activities and task to complete in order to accomplish goals.   Keep all upcoming appointments discussed today Continue with compliance of taking medication prescribed by Doctor Continue working with [facility] care team to assist with goals identified         Our next appointment is by telephone on 4/4 at 1 PM  Please call the care guide team at [phone] if you need to cancel or reschedule your appointment.   If you are experiencing a Mental Health or [place] or need someone to talk to, please call the Suicide and Crisis Lifeline: 988 call 911   The patient verbalized understanding of instructions, educational materials, and care plan provided today and DECLINED offer to receive copy of patient instructions, educational materials, and care plan.   Christa See, MSW, [email or web address] Phone [phone] 5:16 PM

## 2023-02-14 NOTE — Patient Outreach (Addendum)
  Care Coordination   Initial Visit Note   02/14/2023 Name: Mary Gray MRN: [id number] DOB: [birth year]  Mary Gray is a 62 y.o. year old female who sees Gildardo Pounds, NP for primary care. I spoke with  Jess Barters by phone today.  What matters to the patients health and wellness today?  Care Coordination    Goals Addressed             This Visit's Progress    Obtain Supportive Resources   On track    Activities and task to complete in order to accomplish goals.   Keep all upcoming appointments discussed today Continue with compliance of taking medication prescribed by Doctor Continue working with [facility] care team to assist with goals identified         SDOH assessments and interventions completed:  No     Care Coordination Interventions:  Yes, provided   Follow up plan: Follow up call scheduled for 1 week    Encounter Outcome:  Pt. Scheduled   Christa See, MSW, [email or web address] Phone [phone] 5:20 PM

## 2023-02-14 NOTE — Progress Notes (Signed)
Ms. Vancourt is here for lab visit not an office visit

## 2023-02-14 NOTE — Progress Notes (Signed)
Error in scheduling...

## 2023-02-15 ENCOUNTER — Ambulatory Visit: Payer: Self-pay

## 2023-02-15 ENCOUNTER — Telehealth: Payer: Self-pay | Admitting: Nurse Practitioner

## 2023-02-15 ENCOUNTER — Ambulatory Visit (HOSPITAL_COMMUNITY): Payer: Self-pay

## 2023-02-15 ENCOUNTER — Other Ambulatory Visit: Payer: Self-pay | Admitting: Nurse Practitioner

## 2023-02-15 LAB — CMP14+EGFR
ALT: 18 IU/L (ref 0–32)
AST: 25 IU/L (ref 0–40)
Albumin/Globulin Ratio: 1.9 (ref 1.2–2.2)
Albumin: 4.4 g/dL (ref 3.9–4.9)
Alkaline Phosphatase: 80 IU/L (ref 44–121)
BUN/Creatinine Ratio: 10 — ABNORMAL LOW (ref 12–28)
BUN: 7 mg/dL — ABNORMAL LOW (ref 8–27)
Bilirubin Total: 0.3 mg/dL (ref 0.0–1.2)
CO2: 30 mmol/L — ABNORMAL HIGH (ref 20–29)
Calcium: 9.3 mg/dL (ref 8.7–10.3)
Chloride: 87 mmol/L — ABNORMAL LOW (ref 96–106)
Creatinine, Ser: 0.72 mg/dL (ref 0.57–1.00)
Globulin, Total: 2.3 g/dL (ref 1.5–4.5)
Glucose: 220 mg/dL — ABNORMAL HIGH (ref 70–99)
Potassium: 3.6 mmol/L (ref 3.5–5.2)
Sodium: 134 mmol/L (ref 134–144)
Total Protein: 6.7 g/dL (ref 6.0–8.5)
eGFR: 94 mL/min/{1.73_m2} (ref >59)

## 2023-02-15 NOTE — Telephone Encounter (Signed)
Copied from [place] [id number]. Topic: General - Other >> Feb 15, 2023  4:29 PM Everette C wrote: Reason for CRM: The patient has called to follow up on their previous request for antibiotics   Please contact the patient further when possible

## 2023-02-15 NOTE — Telephone Encounter (Signed)
  Chief Complaint: URI Symptoms: cough with congestion, sore throat, body aches, HA, fatigue and weakness, fever Frequency: 1 week Pertinent Negatives: Patient denies SOB Disposition: [] ED /[x] Urgent Care (no appt availability in office) / [] Appointment(In office/virtual)/ []  [facility]/ [] Home Care/ [] Refused Recommended Disposition /[] [facility]/ []  Follow-up with PCP Additional Notes: pt states she was just in office yesterday and doesn't understand why she didn't see provider or get prescribed medications. Pt had lab FU appt yesterday and was scheduled for OV but it was cancelled. Pt would like medication to help with sx. Advised her she would have to be seen, no appts with practice until 02/28/23, recommended UC, pt agreed and scheduled for appt today at 1800. Care advice given and pt verbalized understanding. Pt would still like to have message still sent back to provider to see if she will send in medications for her.   Summary: Body aches, sluggish, fatigue, with cough advice   Pt is calling to report that she has body aches, with sore throat, sluggish, fatigue, with cough. No available appts. Please advise          Reason for Disposition  [1] Sinus congestion (pressure, fullness) AND [2] present > 10 days  Answer Assessment - Initial Assessment Questions 1. ONSET: "When did the nasal discharge start?"      1 week 2. AMOUNT: "How much discharge is there?"      cough 3. COUGH: "Do you have a cough?" If Yes, ask: "Describe the color of your sputum" (clear, white, yellow, green)     Dark yellow  5. FEVER: "Do you have a fever?" If Yes, ask: "What is your temperature, how was it measured, and when did it start?"     Low grade fever  6. SEVERITY: "Overall, how bad are you feeling right now?" (e.g., doesn't interfere with normal activities, staying home from school/work, staying in bed)      Feeling bad  7. OTHER SYMPTOMS: "Do you have any other symptoms?"  (e.g., sore throat, earache, wheezing, vomiting)     Body aches, HA, sore throat, fatigue and weakness  Protocols used: Common Cold-A-AH

## 2023-02-15 NOTE — Telephone Encounter (Signed)
Noted  

## 2023-02-16 ENCOUNTER — Ambulatory Visit (HOSPITAL_COMMUNITY): Payer: Medicare HMO

## 2023-02-19 ENCOUNTER — Other Ambulatory Visit: Payer: Self-pay | Admitting: Nurse Practitioner

## 2023-02-19 DIAGNOSIS — G894 Chronic pain syndrome: Secondary | ICD-10-CM

## 2023-02-19 DIAGNOSIS — J441 Chronic obstructive pulmonary disease with (acute) exacerbation: Secondary | ICD-10-CM

## 2023-02-19 DIAGNOSIS — M47816 Spondylosis without myelopathy or radiculopathy, lumbar region: Secondary | ICD-10-CM

## 2023-02-19 DIAGNOSIS — J40 Bronchitis, not specified as acute or chronic: Secondary | ICD-10-CM

## 2023-02-19 DIAGNOSIS — M5136 Other intervertebral disc degeneration, lumbar region: Secondary | ICD-10-CM

## 2023-02-19 DIAGNOSIS — G2581 Restless legs syndrome: Secondary | ICD-10-CM

## 2023-02-19 MED ORDER — DEXTROMETHORPHAN-GUAIFENESIN 10-100 MG/5ML PO LIQD: 10.0000 mL | Freq: Four times a day (QID) | ORAL | 0 refills | Status: DC | PRN

## 2023-02-20 ENCOUNTER — Ambulatory Visit: Payer: Self-pay

## 2023-02-20 ENCOUNTER — Other Ambulatory Visit: Payer: Self-pay | Admitting: Nurse Practitioner

## 2023-02-20 ENCOUNTER — Telehealth: Payer: Self-pay | Admitting: Nurse Practitioner

## 2023-02-20 ENCOUNTER — Telehealth: Payer: Medicare HMO | Admitting: Nurse Practitioner

## 2023-02-20 DIAGNOSIS — J40 Bronchitis, not specified as acute or chronic: Secondary | ICD-10-CM

## 2023-02-20 MED ORDER — PROMETHAZINE-DM 6.25-15 MG/5ML PO SYRP: 5.0000 mL | ORAL_SOLUTION | Freq: Four times a day (QID) | ORAL | 0 refills | Status: DC | PRN

## 2023-02-20 MED ORDER — FLUTICASONE-SALMETEROL 250-50 MCG/ACT IN AEPB: 1.0000 | INHALATION_SPRAY | Freq: Two times a day (BID) | RESPIRATORY_TRACT | 6 refills | Status: DC

## 2023-02-20 NOTE — Telephone Encounter (Signed)
  Chief Complaint: Dry cough, Sore throat  Symptoms: above Frequency: 1 week Pertinent Negatives: Patient denies fever - however is taking tylenol Disposition: [] ED /[] Urgent Care (no appt availability in office) / [x] Appointment(In office/virtual)/ []  [facility]/ [] Home Care/ [] Refused Recommended Disposition /[] [facility]/ []  Follow-up with PCP Additional Notes: PT has been sick with URI for over 1 week. Medication called in was not helpful. Pt would like medication to calm cough and bring up phlegm. Video visit with provider.  Email address just added to chart.    Summary: Cough, chest congestion   Pt called reporting that she has chest congestion and a lingering cough, says robitussin has been called in however that does not help her she says. Please advise         Reason for Disposition  [1] MILD difficulty breathing (e.g., minimal/no SOB at rest, SOB with walking, pulse <100) AND [2] still present when not coughing  Answer Assessment - Initial Assessment Questions 1. ONSET: "When did the cough begin?"      Last weds 2. SEVERITY: "How bad is the cough today?"      Gotten worse 3. SPUTUM: "Describe the color of your sputum" (none, dry cough; clear, white, yellow, green)     Green - dark green 4. HEMOPTYSIS: "Are you coughing up any blood?" If so ask: "How much?" (flecks, streaks, tablespoons, etc.)     no 5. DIFFICULTY BREATHING: "Are you having difficulty breathing?" If Yes, ask: "How bad is it?" (e.g., mild, moderate, severe)    - MILD: No SOB at rest, mild SOB with walking, speaks normally in sentences, can lie down, no retractions, pulse < 100.    - MODERATE: SOB at rest, SOB with minimal exertion and prefers to sit, cannot lie down flat, speaks in phrases, mild retractions, audible wheezing, pulse 100-120.    - SEVERE: Very SOB at rest, speaks in single words, struggling to breathe, sitting hunched forward, retractions, pulse > 120      Mild  moderate 6. FEVER: "Do you have a fever?" If Yes, ask: "What is your temperature, how was it measured, and when did it start?"     Unsure 7. CARDIAC HISTORY: "Do you have any history of heart disease?" (e.g., heart attack, congestive heart failure)      no 8. LUNG HISTORY: "Do you have any history of lung disease?"  (e.g., pulmonary embolus, asthma, emphysema)     COPD 9. PE RISK FACTORS: "Do you have a history of blood clots?" (or: recent major surgery, recent prolonged travel, bedridden)      10. OTHER SYMPTOMS: "Do you have any other symptoms?" (e.g., runny nose, wheezing, chest pain)       Sore throat. Wheezing, runny nose.  Protocols used: Cough - Acute Non-Productive-A-AH

## 2023-02-20 NOTE — Telephone Encounter (Signed)
Copied from [place] [id number]. Topic: General - Other >> Feb 20, 2023  3:35 PM Ludger Nutting wrote: [facility] that [facility] promethazine-dextromethorphan (PROMETHAZINE-DM) 6.25-15 MG/5ML syrup rx until provider calls rx in with different instructions. [id number] [place], [place] - [address] [place] [address] Phone: [phone]  Fax: [phone]

## 2023-02-21 ENCOUNTER — Ambulatory Visit (HOSPITAL_COMMUNITY): Payer: Medicare HMO

## 2023-02-21 NOTE — Telephone Encounter (Signed)
Insurance will not let her fill the cough syrup until 4-5. It has nothing to do with my order.

## 2023-02-22 ENCOUNTER — Telehealth: Payer: Self-pay | Admitting: Licensed Clinical Social Worker

## 2023-02-22 ENCOUNTER — Encounter: Payer: Self-pay | Admitting: Licensed Clinical Social Worker

## 2023-02-22 NOTE — Patient Outreach (Signed)
  Care Coordination   02/22/2023 Name: Mary Gray MRN: [id number] DOB: November 19, 1961   Care Coordination Outreach Attempts:  An unsuccessful telephone outreach was attempted for a scheduled appointment today.  Follow Up Plan:  Additional outreach attempts will be made to offer the patient care coordination information and services.   Encounter Outcome:  No Answer   Care Coordination Interventions:  No, not indicated    Christa See, MSW, [email or web address] Phone [phone] 1:25 PM

## 2023-02-23 ENCOUNTER — Ambulatory Visit: Payer: Self-pay | Admitting: Licensed Clinical Social Worker

## 2023-02-26 NOTE — Patient Instructions (Signed)
Visit Information  Thank you for taking time to visit with me today. Please don't hesitate to contact me if I can be of assistance to you.   Following are the goals we discussed today:   Goals Addressed             This Visit's Progress    Obtain Supportive Resources   On track    Activities and task to complete in order to accomplish goals.   Keep all upcoming appointments discussed today Continue with compliance of taking medication prescribed by Doctor Continue working with [facility] care team to assist with goals identified         Our next appointment is by telephone on 5/3 at 3:30 PM  Please call the care guide team at [phone] if you need to cancel or reschedule your appointment.   If you are experiencing a Mental Health or Behavioral Health Crisis or need someone to talk to, please call the Suicide and Crisis Lifeline: 988 call 911   The patient verbalized understanding of instructions, educational materials, and care plan provided today and DECLINED offer to receive copy of patient instructions, educational materials, and care plan.   Jenel Lucks, MSW, LCSW [facility] Care Management [place]  [facility] [email or web address] Phone [phone] 1:31 PM

## 2023-02-26 NOTE — Patient Outreach (Signed)
  Care Coordination   Follow Up Visit Note   02/23/23 Name: Mary Gray MRN: [id number] DOB: 07-17-1961  Mary Gray is a 62 y.o. year old female who sees Mary Rigg, NP for primary care. I spoke with  Mary Gray by phone today.  What matters to the patients health and wellness today?  Symptom Management    Goals Addressed             This Visit's Progress    Obtain Supportive Resources   On track    Activities and task to complete in order to accomplish goals.   Keep all upcoming appointments discussed today Continue with compliance of taking medication prescribed by Doctor Continue working with [facility] care team to assist with goals identified         SDOH assessments and interventions completed:  No     Care Coordination Interventions:  Yes, provided  Interventions Today    Flowsheet Row Most Recent Value  Chronic Disease   Chronic disease during today's visit Hypertension (HTN), Diabetes, Other  [GAD]  General Interventions   General Interventions Discussed/Reviewed General Interventions Reviewed  [Patient discussed symptoms and identified triggers (pollen) Continues to comply with medications and prioritzes hydration]  Mental Health Interventions   Mental Health Discussed/Reviewed Mental Health Reviewed, Coping Strategies, Anxiety  Pharmacy Interventions   Pharmacy Dicussed/Reviewed Pharmacy Topics Discussed, Medication Adherence  [Patient reports no barriers in obtaining medication, states it's available today]       Follow up plan: Follow up call scheduled for 2-4 weeks    Encounter Outcome:  Pt. Visit Completed   Mary Gray, MSW, LCSW [facility] Care Management [facility]  [facility] [email or web address] Phone [phone] 1:30 PM

## 2023-02-28 ENCOUNTER — Encounter (HOSPITAL_COMMUNITY): Payer: Self-pay

## 2023-02-28 ENCOUNTER — Ambulatory Visit (INDEPENDENT_AMBULATORY_CARE_PROVIDER_SITE_OTHER): Payer: Medicare HMO

## 2023-02-28 ENCOUNTER — Ambulatory Visit (HOSPITAL_COMMUNITY)
Admission: EM | Admit: 2023-02-28 | Discharge: 2023-02-28 | Disposition: A | Discharge status: Home / Self Care | Payer: Medicare HMO | Attending: Emergency Medicine | Admitting: Emergency Medicine

## 2023-02-28 ENCOUNTER — Ambulatory Visit: Payer: Medicare HMO | Admitting: Internal Medicine

## 2023-02-28 ENCOUNTER — Ambulatory Visit: Payer: Self-pay | Admitting: *Deleted

## 2023-02-28 DIAGNOSIS — M79604 Pain in right leg: Secondary | ICD-10-CM | POA: Diagnosis not present

## 2023-02-28 DIAGNOSIS — J441 Chronic obstructive pulmonary disease with (acute) exacerbation: Secondary | ICD-10-CM | POA: Diagnosis not present

## 2023-02-28 DIAGNOSIS — R059 Cough, unspecified: Secondary | ICD-10-CM

## 2023-02-28 DIAGNOSIS — R0602 Shortness of breath: Secondary | ICD-10-CM | POA: Diagnosis not present

## 2023-02-28 DIAGNOSIS — R600 Localized edema: Secondary | ICD-10-CM | POA: Insufficient documentation

## 2023-02-28 DIAGNOSIS — M79605 Pain in left leg: Secondary | ICD-10-CM | POA: Insufficient documentation

## 2023-02-28 LAB — CBC WITH DIFFERENTIAL/PLATELET
Abs Immature Granulocytes: 0.02 10*3/uL (ref 0.00–0.07)
Basophils Absolute: 0.1 10*3/uL (ref 0.0–0.1)
Basophils Relative: 1 %
Eosinophils Absolute: 0.3 10*3/uL (ref 0.0–0.5)
Eosinophils Relative: 5 %
HCT: 31.9 % — ABNORMAL LOW (ref 36.0–46.0)
Hemoglobin: 10.3 g/dL — ABNORMAL LOW (ref 12.0–15.0)
Immature Granulocytes: 0 %
Lymphocytes Relative: 18 %
Lymphs Abs: 1.1 10*3/uL (ref 0.7–4.0)
MCH: 28.6 pg (ref 26.0–34.0)
MCHC: 32.3 g/dL (ref 30.0–36.0)
MCV: 88.6 fL (ref 80.0–100.0)
Monocytes Absolute: 0.4 10*3/uL (ref 0.1–1.0)
Monocytes Relative: 7 %
Neutro Abs: 4.1 10*3/uL (ref 1.7–7.7)
Neutrophils Relative %: 69 %
Platelets: 292 10*3/uL (ref 150–400)
RBC: 3.6 MIL/uL — ABNORMAL LOW (ref 3.87–5.11)
RDW: 13.3 % (ref 11.5–15.5)
WBC: 5.9 10*3/uL (ref 4.0–10.5)
nRBC: 0 % (ref 0.0–0.2)

## 2023-02-28 LAB — COMPREHENSIVE METABOLIC PANEL
ALT: 23 U/L (ref 0–44)
AST: 44 U/L — ABNORMAL HIGH (ref 15–41)
Albumin: 3.6 g/dL (ref 3.5–5.0)
Alkaline Phosphatase: 62 U/L (ref 38–126)
Anion gap: 13 (ref 5–15)
BUN: 7 mg/dL — ABNORMAL LOW (ref 8–23)
CO2: 25 mmol/L (ref 22–32)
Calcium: 9 mg/dL (ref 8.9–10.3)
Chloride: 93 mmol/L — ABNORMAL LOW (ref 98–111)
Creatinine, Ser: 0.77 mg/dL (ref 0.44–1.00)
GFR, Estimated: >60 mL/min (ref >60)
Glucose, Bld: 268 mg/dL — ABNORMAL HIGH (ref 70–99)
Potassium: 4.3 mmol/L (ref 3.5–5.1)
Sodium: 131 mmol/L — ABNORMAL LOW (ref 135–145)
Total Bilirubin: 0.4 mg/dL (ref 0.3–1.2)
Total Protein: 6.4 g/dL — ABNORMAL LOW (ref 6.5–8.1)

## 2023-02-28 MED ORDER — BENZONATATE 100 MG PO CAPS: 200.0000 mg | ORAL_CAPSULE | Freq: Three times a day (TID) | ORAL | 0 refills | Status: AC

## 2023-02-28 MED ORDER — KETOROLAC TROMETHAMINE 30 MG/ML IJ SOLN
INTRAMUSCULAR | Status: AC
  Filled 2023-02-28: qty 1

## 2023-02-28 MED ORDER — KETOROLAC TROMETHAMINE 30 MG/ML IJ SOLN
30.0000 mg | Freq: Once | INTRAMUSCULAR | Status: AC
  Administered 2023-02-28: 30 mg via INTRAMUSCULAR

## 2023-02-28 MED ORDER — HYDROXYZINE HCL 25 MG PO TABS: 25.0000 mg | ORAL_TABLET | Freq: Four times a day (QID) | ORAL | 0 refills | Status: AC

## 2023-02-28 NOTE — Telephone Encounter (Signed)
Reason for Disposition  SEVERE leg swelling (e.g., swelling extends above knee, entire leg is swollen, weeping fluid)  Answer Assessment - Initial Assessment Questions 1. ONSET: "When did the swelling start?" (e.g., minutes, hours, days)     Patient stopped lasix- legs have been swelling- over 7 days 2. LOCATION: "What part of the leg is swollen?"  "Are both legs swollen or just one leg?"     bilateral 3. SEVERITY: "How bad is the swelling?" (e.g., localized; mild, moderate, severe)   - Localized: Small area of swelling localized to one leg.   - MILD pedal edema: Swelling limited to foot and ankle, pitting edema < 1/4 inch (6 mm) deep, rest and elevation eliminate most or all swelling.   - MODERATE edema: Swelling of lower leg to knee, pitting edema > 1/4 inch (6 mm) deep, rest and elevation only partially reduce swelling.   - SEVERE edema: Swelling extends above knee, facial or hand swelling present.      severe 4. REDNESS: "Does the swelling look red or infected?"     Redness- legs below knee 5. PAIN: "Is the swelling painful to touch?" If Yes, ask: "How painful is it?"   (Scale 1-10; mild, moderate or severe)     Pain at knee- 8 6. FEVER: "Do you have a fever?" If Yes, ask: "What is it, how was it measured, and when did it start?"      no 7. CAUSE: "What do you think is causing the leg swelling?"     Stopped lasix 8. MEDICAL HISTORY: "Do you have a history of blood clots (e.g., DVT), cancer, heart failure, kidney disease, or liver failure?"     Heart disease 9. RECURRENT SYMPTOM: "Have you had leg swelling before?" If Yes, ask: "When was the last time?" "What happened that time?"     no 10. OTHER SYMPTOMS: "Do you have any other symptoms?" (e.g., chest pain, difficulty breathing)       Wheezing, cough  Protocols used: Leg Swelling and Edema-A-AH

## 2023-02-28 NOTE — ED Provider Notes (Signed)
[facility]    CSN: [id number] Arrival date & time: 02/28/23  1603      History   Chief Complaint Chief Complaint  Patient presents with   Leg Pain    HPI Mary Gray is a 62 y.o. female.   Patient presents to clinic for bilateral lower extremity edema that extends up into her thighs and for nonproductive cough with shortness of breath.   She had labs drawn on March 27 and was told to discontinue her Lasix after they resulted.  Since then she has had lower extremity edema and pain. Has been trying to elevate her legs, has not been using compression stockings.   Patient reports a bronchitis / COPD flare that has been ongoing. She has been trying to do her breathing treatments at home, but has been worried about her leg swelling. Endorses shortness of breath, denies chest pain, she denies fevers. She does still smoke, reports she is interested in smoking cessation.   The history is provided by the patient and medical records.  Leg Pain Associated symptoms: back pain   Associated symptoms: no fatigue and no fever     Past Medical History:  Diagnosis Date   Asthma    Back pain    Chronic interstitial cystitis    Followed by Dr. Marcine Matar   COPD (chronic obstructive pulmonary disease)    DDD (degenerative disc disease), lumbar    Degeneration of cervical intervertebral disc    Depression    Diabetes mellitus, type 2 2007   Heme positive stool    EGD on 04/08/2010 by Dr. Ewing Schlein showed chronic gastritis and a few gastric polyps; pathology showed a fundic gland polyp.  Colonoscopy on 04/08/2010 showed small external and internal hemorrhoids, and a few benign-appearing diminutive polyps in the rectum, the distal sigmoid colon, and in the distal descending colon; pathology showed hyperplastic polyps.    Hypertension    Hypothyroidism    Iron deficiency anemia    Spinal stenosis     Patient Active Problem List   Diagnosis Date Noted   Acute respiratory  failure with hypoxia    COVID-19 virus infection 01/20/2022   Lumbar spondylosis 03/22/2021   Chronic radicular lumbar pain 03/22/2021   Bilateral primary osteoarthritis of knee 03/22/2021   Chronic pain syndrome 03/22/2021   Tobacco use disorder 03/22/2021   Hyponatremia 03/04/2021   Chronic bilateral low back pain without sciatica 12/13/2020   Sleep disturbance 12/13/2020   Vaginitis 10/27/2020   Right ear pain 10/13/2020   Nausea 02/10/2020   B12 deficiency 11/25/2018   Urinary, incontinence, stress female 09/09/2018   Primary osteoarthritis of right knee 05/01/2018   Chronic venous insufficiency 02/26/2017   Candidal skin infection 03/03/2014   Preventative health care 05/14/2013   Spinal stenosis, lumbar region, with neurogenic claudication 03/01/2011   Hypothyroidism 07/06/2006   Diabetes mellitus type 2, uncontrolled 07/06/2006   Hyperlipemia 07/06/2006   Generalized anxiety disorder 07/06/2006   Essential hypertension 07/06/2006   GERD 07/06/2006   Lumbar degenerative disc disease 07/06/2006    Past Surgical History:  Procedure Laterality Date   CARPAL TUNNEL RELEASE  05/08/2000   By Dr. Katy Fitch. Sypher, Montez Hageman.    OB History   No obstetric history on file.      Home Medications    Prior to Admission medications   Medication Sig Start Date End Date Taking? Authorizing Provider  benzonatate (TESSALON) 100 MG capsule Take 2 capsules (200 mg total) by mouth every 8 (  eight) hours for 5 days. 02/28/23 03/05/23 Yes Woodley Petzold, [place] N, FNP  Accu-Chek Softclix Lancets lancets Check blood sugar 1 time per day 07/12/20   Tyson Alias, MD  acetaminophen (TYLENOL) 500 MG tablet Take 1,000 mg by mouth every 6 (six) hours as needed for mild pain.    [provider]  albuterol (PROVENTIL) (2.5 MG/3ML) 0.083% nebulizer solution Take 3 mLs (2.5 mg total) by nebulization every 6 (six) hours as needed for wheezing or shortness of breath. 01/03/23   Claiborne Rigg,  NP  albuterol (VENTOLIN HFA) 108 (90 Base) MCG/ACT inhaler INHALE 1-2 PUFFS BY MOUTH EVERY 6 HOURS AS NEEDED FOR WHEEZE OR SHORTNESS OF BREATH 02/14/23   Hoy Register, MD  amitriptyline (ELAVIL) 25 MG tablet Take 1 tablet (25 mg total) by mouth at bedtime. 10/23/22   Claiborne Rigg, NP  amLODipine (NORVASC) 5 MG tablet Take 1 tablet (5 mg total) by mouth daily. 10/04/22 04/02/23  Anders Simmonds, PA-C  aspirin EC 81 MG tablet Take 81 mg by mouth daily. Swallow whole.    [provider]  [facility] VITAMIN B12 1000 MCG tablet TAKE 1 TABLET BY MOUTH EVERY DAY Patient taking differently: Take 1,000 mcg by mouth daily. 01/12/21   Tyson Alias, MD  cyclobenzaprine (FLEXERIL) 5 MG tablet Take 1 tablet (5 mg total) by mouth at bedtime. 03/02/23   Claiborne Rigg, NP  dextromethorphan-guaiFENesin (TUSSIN DM) 10-100 MG/5ML liquid Take 10 mLs by mouth every 6 (six) hours as needed for cough. 02/19/23   Claiborne Rigg, NP  enalapril (VASOTEC) 20 MG tablet Take 1 tablet (20 mg total) by mouth daily. 10/04/22   Anders Simmonds, PA-C  esomeprazole (NEXIUM) 40 MG capsule TAKE 1 CAPSULE (40 MG TOTAL) BY MOUTH DAILY. 10/09/22 04/07/23  Hoy Register, MD  fluticasone-salmeterol (WIXELA INHUB) 250-50 MCG/ACT AEPB Inhale 1 puff into the lungs in the morning and at bedtime. 02/20/23   Claiborne Rigg, NP  gabapentin (NEURONTIN) 600 MG tablet Take 1 tablet (600 mg total) by mouth 3 (three) times daily. 01/30/23   Claiborne Rigg, NP  glucose blood (ACCU-CHEK GUIDE) test strip CHECK BLOOD SUGAR 1 TIME PER DAY 05/12/21   Tyson Alias, MD  hydrOXYzine (ATARAX) 25 MG tablet TAKE 1 TABLET BY MOUTH 3 TIMES DAILY. FOR ANXIETY 06/07/22   Hoy Register, MD  ibuprofen (ADVIL) 200 MG tablet Take 400 mg by mouth every 6 (six) hours as needed for fever, headache or mild pain.    [provider]  levothyroxine (SYNTHROID) 88 MCG tablet Take 1 tablet (88 mcg total) by mouth daily before breakfast.  01/03/23   Claiborne Rigg, NP  metFORMIN (GLUCOPHAGE-XR) 500 MG 24 hr tablet Take 2 tablets (1,000 mg total) by mouth 2 (two) times daily. 10/04/22   Anders Simmonds, PA-C  multivitamin-iron-minerals-folic acid (CENTRUM) chewable tablet Chew 1 tablet by mouth daily. 07/12/20   Tyson Alias, MD  naloxegol oxalate (MOVANTIK) 25 MG TABS tablet Take 1 tablet (25 mg total) by mouth daily. 01/03/23   Claiborne Rigg, NP  nystatin (NYSTATIN) powder Apply 1 application topically 3 (three) times daily. 03/10/21   [name], MD  nystatin cream (MYCOSTATIN) Apply 1 Application topically 2 (two) times daily. 01/03/23   Claiborne Rigg, NP  ondansetron (ZOFRAN) 4 MG tablet Take 1 tablet (4 mg total) by mouth every 8 (eight) hours as needed for nausea or vomiting. 01/03/23   Claiborne Rigg, NP  predniSONE (  DELTASONE) 20 MG tablet Take 2 tablets (40 mg total) by mouth daily. 01/31/23   Mardella Layman, MD  PREMARIN vaginal cream PLACE 1 APPLICATORFUL VAGINALLY DAILY. Patient taking differently: 1 applicator 2 (two) times a week. 11/23/21   Tyson Alias, MD  promethazine-dextromethorphan (PROMETHAZINE-DM) 6.25-15 MG/5ML syrup Take 5 mLs by mouth 4 (four) times daily as needed for cough. 02/20/23   Claiborne Rigg, NP  rosuvastatin (CRESTOR) 20 MG tablet Take 1 tablet (20 mg total) by mouth daily. 10/04/22   Anders Simmonds, PA-C  Tiotropium Bromide Monohydrate (SPIRIVA RESPIMAT) 2.5 MCG/ACT AERS Inhale 2 puffs into the lungs daily. 12/30/22   Allwardt, Crist Infante, PA-C  triamcinolone (KENALOG) 0.025 % ointment Apply 1 Application topically 2 (two) times daily. 01/22/23   Claiborne Rigg, NP  zolpidem (AMBIEN) 5 MG tablet Take 1 tablet (5 mg total) by mouth at bedtime. 01/22/23   Claiborne Rigg, NP    Family History Family History  Problem Relation Age of Onset   Heart attack Father 63   Diabetes Mother    Hypertension Mother    Diabetes Sister    Breast cancer Neg Hx    Colon cancer Neg Hx      Social History Social History   Tobacco Use   Smoking status: Some Days    Packs/day: 0.50    Years: 45.00    Additional pack years: 0.00    Total pack years: 22.50    Types: Cigarettes   Smokeless tobacco: Never   Tobacco comments:    1/2 pack per day  Vaping Use   Vaping Use: Never used  Substance Use Topics   Alcohol use: No   Drug use: No     Allergies   Amoxicillin, Codeine sulfate, Meperidine and related, Minocycline hcl, Penicillins, Propoxyphene n-acetaminophen, and Tetracycline hcl   Review of Systems Review of Systems  Constitutional:  Negative for fatigue and fever.  HENT:  Negative for sore throat.   Respiratory:  Positive for cough, chest tightness, shortness of breath and wheezing.   Cardiovascular:  Positive for leg swelling. Negative for chest pain.  Gastrointestinal:  Negative for abdominal pain.  Genitourinary:  Negative for dysuria.  Musculoskeletal:  Positive for arthralgias and back pain.     Physical Exam Triage Vital Signs ED Triage Vitals  Enc Vitals Group     BP 02/28/23 1656 124/65     Pulse Rate 02/28/23 1656 (!) 102     Resp 02/28/23 1656 19     Temp 02/28/23 1656 98.2 F (36.8 C)     Temp src --      SpO2 02/28/23 1656 91 %     Weight --      Height --      Head Circumference --      Peak Flow --      Pain Score 02/28/23 1655 8     Pain Loc --      Pain Edu? --      Excl. in GC? --    No data found.  Updated Vital Signs BP 124/65   Pulse (!) 102   Temp 98.2 F (36.8 C)   Resp 19   LMP 01/17/2009   SpO2 91%   Visual Acuity Right Eye Distance:   Left Eye Distance:   Bilateral Distance:    Right Eye Near:   Left Eye Near:    Bilateral Near:     Physical Exam Vitals and nursing note reviewed.  Constitutional:  General: She is not in acute distress.    Appearance: She is well-developed.  HENT:     Head: Normocephalic and atraumatic.     Right Ear: External ear normal.     Left Ear: External ear  normal.     Nose: Nose normal.     Mouth/Throat:     Mouth: Mucous membranes are moist.  Eyes:     General: No scleral icterus.       Right eye: No discharge.        Left eye: No discharge.     Conjunctiva/sclera: Conjunctivae normal.  Cardiovascular:     Rate and Rhythm: Normal rate and regular rhythm.     Heart sounds: Normal heart sounds. No murmur heard. Pulmonary:     Effort: Pulmonary effort is normal. No respiratory distress.     Breath sounds: Examination of the right-upper field reveals wheezing. Examination of the left-upper field reveals wheezing. Examination of the right-lower field reveals decreased breath sounds. Examination of the left-lower field reveals decreased breath sounds. Decreased breath sounds and wheezing present.     Comments: Lungs slightly diminished in lower lobes with expiratory wheezing in upper lobes. Musculoskeletal:        General: Swelling and tenderness present. No deformity or signs of injury. Normal range of motion.     Cervical back: Neck supple.     Right lower leg: 2+ Pitting Edema present.     Left lower leg: 2+ Pitting Edema present.  Skin:    General: Skin is warm and dry.     Capillary Refill: Capillary refill takes less than 2 seconds.  Neurological:     Mental Status: She is alert and oriented to person, place, and time.  Psychiatric:        Mood and Affect: Mood normal.        Behavior: Behavior is cooperative.      UC Treatments / Results  Labs (all labs ordered are listed, but only abnormal results are displayed) Labs Reviewed  COMPREHENSIVE METABOLIC PANEL  CBC WITH DIFFERENTIAL/PLATELET    EKG   Radiology DG Chest 2 View  Result Date: 02/28/2023 CLINICAL DATA:  Shortness of breath and cough EXAM: CHEST - 2 VIEW COMPARISON:  Chest x-ray 01/31/2023 FINDINGS: The heart size and mediastinal contours are within normal limits. Both lungs are clear. The visualized skeletal structures are unremarkable. IMPRESSION: No active  cardiopulmonary disease. Electronically Signed   By: Darliss Cheney M.D.   On: 02/28/2023 17:41    Procedures Procedures (including critical care time)  Medications Ordered in UC Medications  ketorolac (TORADOL) 30 MG/ML injection 30 mg (30 mg Intramuscular Given 02/28/23 1756)    Initial Impression / Assessment and Plan / UC Course  I have reviewed the triage vital signs and the nursing notes.  Pertinent labs & imaging results that were available during my care of the patient were reviewed by me and considered in my medical decision making (see chart for details).  Vitals in triage reviewed, patient is hemodynamically stable.  Oxygenation 91% on room air, does have access to breathing treatments and inhalers at home.  Slightly diminished lung sounds in lower lobes and expiratory wheezing in upper, chest x-ray obtained in clinic was negative for cardiopulmonary disease.  Encouraged smoking cessation and to follow-up with primary care for further help with this.  She is able to speak in full sentences and is in no acute respiratory distress.  Will withhold steroids as patient recently completed oral prednisone  March 17th 2024, IM steroids on 12/30/22, and IM steroids on 12/23/22.   Bilateral lower extremities with 2+ pitting edema, tender to touch, edema extends up into her distal thighs.  Patient was recently removed from her Lasix, was unsure why.  Shortly after this her edema started.  Will obtain CBC and CMP today.  If there are no gross electrolyte abnormalities, advise restarting on her Lasix and following up with her PCP.  Given Tessalon Perles for cough as well as Atarax for itching in her lower extremities.  Low concern for cellulitis or acute infection without fever, legs are slightly warm to touch without erythema or streaking.  Patient verbalized understanding, no questions at this time.  Return emergency precautions discussed.     Final Clinical Impressions(s) / UC Diagnoses   Final  diagnoses:  Bilateral leg pain  Bilateral leg edema  COPD exacerbation     Discharge Instructions      We are checking her labs today and will get back to you soon with the results if you can restart your Lasix.  We have given you a small dose of Toradol in clinic for your pain.  Please elevate your extremities at home.  It is important that you stop smoking, you can reach out to your primary care in regards to smoking cessation.  Your chest x-ray was negative for pneumonia, please continue to use your breathing treatments and you can use the Tessalon Perles as needed for cough.  Please schedule follow-up appointment with your primary care to be evaluated either later this week or early next week.  Please seek immediate care if you develop fever, worsening of swelling, shortness of breath, chest pain or any worsening of condition.      ED Prescriptions     Medication Sig Dispense Auth. Provider   benzonatate (TESSALON) 100 MG capsule Take 2 capsules (200 mg total) by mouth every 8 (eight) hours for 5 days. 30 capsule Phil Michels, [place] N, [place]      PDMP not reviewed this encounter.   Rinaldo RatelGarrison, [place] N, [place]NP 02/28/23 [phone]

## 2023-02-28 NOTE — ED Triage Notes (Addendum)
Pt presents with complaints of bilateral leg pain. Denies any injury. Pt recently stopped taking lasix 7 days ago. Legs are swollen and tight and discoloration towards the bottom. Pt could not get tennis shoes on.  Pt also endorses cough that has continued after bronchitis 2 months ago. Now has copd dx.

## 2023-02-28 NOTE — Telephone Encounter (Signed)
  Chief Complaint: bilateral leg swelling Symptoms: pain/redness in legs- patient is requesting pain medication, hard to walk Frequency: patient states she was advised to stop lasix and the swelling started soon after Pertinent Negatives: Patient denies chest pain Disposition: [] ED /[x] Urgent Care (no appt availability in office) / [] Appointment(In office/virtual)/ []  [facility]/ [] Home Care/ [] Refused Recommended Disposition /[] [facility]/ []  Follow-up with PCP Additional Notes: No open appointment- patient advised UC for evaluation

## 2023-02-28 NOTE — Telephone Encounter (Signed)
Patient at UC

## 2023-02-28 NOTE — Discharge Instructions (Signed)
We are checking her labs today and will get back to you soon with the results if you can restart your Lasix.  We have given you a small dose of Toradol in clinic for your pain.  Please elevate your extremities at home.  It is important that you stop smoking, you can reach out to your primary care in regards to smoking cessation.  Your chest x-ray was negative for pneumonia, please continue to use your breathing treatments and you can use the Tessalon Perles as needed for cough.  Please schedule follow-up appointment with your primary care to be evaluated either later this week or early next week.  Please seek immediate care if you develop fever, worsening of swelling, shortness of breath, chest pain or any worsening of condition.

## 2023-03-01 ENCOUNTER — Ambulatory Visit: Payer: Medicare HMO | Admitting: Cardiology

## 2023-03-01 NOTE — Telephone Encounter (Addendum)
Follow-up call : Patient seen at [facility] for Bilateral leg pain +2 or more edema.  Called to schedule follow-up appointment with PCP per D/C summary.  Unable to reach message left on VM.

## 2023-03-02 ENCOUNTER — Telehealth: Payer: Self-pay | Admitting: Nurse Practitioner

## 2023-03-02 ENCOUNTER — Telehealth: Payer: Self-pay

## 2023-03-02 DIAGNOSIS — J449 Chronic obstructive pulmonary disease, unspecified: Secondary | ICD-10-CM | POA: Diagnosis not present

## 2023-03-02 NOTE — Telephone Encounter (Signed)
Copied from CRM [id number]. Topic: Referral - Request for Referral >> Mar 02, 2023 11:03 AM Patsy Lager T wrote: Has patient seen PCP for this complaint? yes Referral for which specialty: durable medical equipment Preferred provider/office: unknown Reason for referral: patient needs a grab bar an shower chair. She stated the req could be faxed to [phone] and the [phone] but she did not know the name of the company

## 2023-03-02 NOTE — Telephone Encounter (Signed)
Call placed to patient as a follow-up to 04/010/2024 visit to UC for Bilateral leg pain + with edema 2 more. Patient voiced that she was instructed to follow-up with PCP. It in noted that a referral was ordered by her PCP for vascular on 02/01/2023 for Venous insufficiency of both lower extremities . Patient voiced that she was called and appointment was changed to a later date 05/27 due  to scheduling issues. Patient placed on hold office called for earlier appointment. Patient scheduled to be seen on 03/08/2023 at 12:00 pm. Patient is aware. Advised to keep this appointment. Advised that if needed she should follow-up with PCP after this visit. Patient does not have MY CHART. Patient voiced that she tried to sign up for this service and could not. MY CHART  IT  information given. Reviewed with patient all the appointment she had up coming  and when she sign up on MY CHART she would be able to see them along with contact information and addresses. Patient was very appreciative and voiced she was going to call today to sign up. Patient voiced understanding of all discussed .

## 2023-03-02 NOTE — Telephone Encounter (Signed)
Copied from CRM [id number]. Topic: General - Other >> Mar 02, 2023  9:11 AM Everette C wrote: Reason for CRM: The patient has returned a missed call from C. Manson Passey RN   Please contact further when possible

## 2023-03-03 ENCOUNTER — Other Ambulatory Visit: Payer: Self-pay | Admitting: Nurse Practitioner

## 2023-03-03 MED ORDER — MISC. DEVICES MISC: 0 refills | Status: AC

## 2023-03-03 NOTE — Telephone Encounter (Signed)
Prescription in chart

## 2023-03-05 NOTE — Telephone Encounter (Signed)
Return call unanswered.  

## 2023-03-05 NOTE — Progress Notes (Deleted)
03/05/2023 Mary Gray [id number] 08-17-1961  Referring provider: Claiborne Rigg, NP Primary GI doctor: {acdocs:27040}  ASSESSMENT AND PLAN:   There are no diagnoses linked to this encounter.   Patient Care Team: Claiborne Rigg, NP as PCP - General (Nurse Practitioner) Ernesto Rutherford, MD as Consulting Physician (Ophthalmology) Glenford Peers, OD as Referring Physician (Optometry)  HISTORY OF PRESENT ILLNESS: 62 y.o. female with a past medical history of COPD, type 2 diabetes, hypertension, hypothyroidism, IDA, GERD and others listed below presents for evaluation of constipation.   04/08/2010 colonoscopy small external and internal hemorrhoids few benign-appearing diminutive polyps in the rectum, sigmoid, distal descending colon showed hyperplastic polyps 04/08/2010 EGD Dr. Ewing Schlein chronic gastritis and fundic gland polyps  Patient {Actions; denies-reports:120008} family history of colon cancer or other gastrointestinal malignancies.   She {Actions; denies-reports:120008} blood thinner use.  She {Actions; denies-reports:120008} NSAID use.  She {Actions; denies-reports:120008} ETOH use.   She {Actions; denies-reports:120008} tobacco use.  She {Actions; denies-reports:120008} drug use.    She  reports that she has been smoking cigarettes. She has a 22.50 pack-year smoking history. She has never used smokeless tobacco. She reports that she does not drink alcohol and does not use drugs.  RELEVANT LABS AND IMAGING: CBC    Component Value Date/Time   WBC 5.9 02/28/2023 1746   RBC 3.60 (L) 02/28/2023 1746   HGB 10.3 (L) 02/28/2023 1746   HGB 13.1 04/19/2022 1438   HCT 31.9 (L) 02/28/2023 1746   HCT 38.2 04/19/2022 1438   PLT 292 02/28/2023 1746   PLT 252 04/19/2022 1438   MCV 88.6 02/28/2023 1746   MCV 87 04/19/2022 1438   MCH 28.6 02/28/2023 1746   MCHC 32.3 02/28/2023 1746   RDW 13.3 02/28/2023 1746   RDW 13.0 04/19/2022 1438   LYMPHSABS 1.1 02/28/2023 1746    LYMPHSABS 2.6 04/19/2022 1438   MONOABS 0.4 02/28/2023 1746   EOSABS 0.3 02/28/2023 1746   EOSABS 0.4 04/19/2022 1438   BASOSABS 0.1 02/28/2023 1746   BASOSABS 0.1 04/19/2022 1438   Recent Labs    04/19/22 1438 01/29/23 1201 01/31/23 1925 02/28/23 1746  HGB 13.1 10.5* 11.4* 10.3*    CMP     Component Value Date/Time   NA 131 (L) 02/28/2023 1746   NA 134 02/14/2023 1535   K 4.3 02/28/2023 1746   CL 93 (L) 02/28/2023 1746   CO2 25 02/28/2023 1746   GLUCOSE 268 (H) 02/28/2023 1746   BUN 7 (L) 02/28/2023 1746   BUN 7 (L) 02/14/2023 1535   CREATININE 0.77 02/28/2023 1746   CREATININE 0.55 02/09/2015 1243   CALCIUM 9.0 02/28/2023 1746   PROT 6.4 (L) 02/28/2023 1746   PROT 6.7 02/14/2023 1535   ALBUMIN 3.6 02/28/2023 1746   ALBUMIN 4.4 02/14/2023 1535   AST 44 (H) 02/28/2023 1746   ALT 23 02/28/2023 1746   ALKPHOS 62 02/28/2023 1746   BILITOT 0.4 02/28/2023 1746   BILITOT 0.3 02/14/2023 1535   GFRNONAA >60 02/28/2023 1746   GFRNONAA >89 02/09/2015 1243   GFRAA >60 02/20/2020 1825   GFRAA >89 02/09/2015 1243      Latest Ref Rng & Units 02/28/2023    5:46 PM 02/14/2023    3:35 PM 01/31/2023    7:25 PM  Hepatic Function  Total Protein 6.5 - 8.1 g/dL 6.4  6.7    Albumin 3.5 - 5.0 g/dL 3.6  4.4  3.8   AST 15 - 41 U/L 44  25  ALT 0 - 44 U/L 23  18    Alk Phosphatase 38 - 126 U/L 62  80    Total Bilirubin 0.3 - 1.2 mg/dL 0.4  0.3        Current Medications:   Current Outpatient Medications (Endocrine & Metabolic):    levothyroxine (SYNTHROID) 88 MCG tablet, Take 1 tablet (88 mcg total) by mouth daily before breakfast.   metFORMIN (GLUCOPHAGE-XR) 500 MG 24 hr tablet, Take 2 tablets (1,000 mg total) by mouth 2 (two) times daily.   predniSONE (DELTASONE) 20 MG tablet, Take 2 tablets (40 mg total) by mouth daily.  Current Outpatient Medications (Cardiovascular):    amLODipine (NORVASC) 5 MG tablet, Take 1 tablet (5 mg total) by mouth daily.   enalapril (VASOTEC) 20  MG tablet, Take 1 tablet (20 mg total) by mouth daily.   rosuvastatin (CRESTOR) 20 MG tablet, Take 1 tablet (20 mg total) by mouth daily.  Current Outpatient Medications (Respiratory):    albuterol (PROVENTIL) (2.5 MG/3ML) 0.083% nebulizer solution, Take 3 mLs (2.5 mg total) by nebulization every 6 (six) hours as needed for wheezing or shortness of breath.   albuterol (VENTOLIN HFA) 108 (90 Base) MCG/ACT inhaler, INHALE 1-2 PUFFS BY MOUTH EVERY 6 HOURS AS NEEDED FOR WHEEZE OR SHORTNESS OF BREATH   benzonatate (TESSALON) 100 MG capsule, Take 2 capsules (200 mg total) by mouth every 8 (eight) hours for 5 days.   dextromethorphan-guaiFENesin (TUSSIN DM) 10-100 MG/5ML liquid, Take 10 mLs by mouth every 6 (six) hours as needed for cough.   fluticasone-salmeterol (WIXELA INHUB) 250-50 MCG/ACT AEPB, Inhale 1 puff into the lungs in the morning and at bedtime.   promethazine-dextromethorphan (PROMETHAZINE-DM) 6.25-15 MG/5ML syrup, Take 5 mLs by mouth 4 (four) times daily as needed for cough.   Tiotropium Bromide Monohydrate (SPIRIVA RESPIMAT) 2.5 MCG/ACT AERS, Inhale 2 puffs into the lungs daily.  Current Outpatient Medications (Analgesics):    acetaminophen (TYLENOL) 500 MG tablet, Take 1,000 mg by mouth every 6 (six) hours as needed for mild pain.   aspirin EC 81 MG tablet, Take 81 mg by mouth daily. Swallow whole.   ibuprofen (ADVIL) 200 MG tablet, Take 400 mg by mouth every 6 (six) hours as needed for fever, headache or mild pain.  Current Outpatient Medications (Hematological):    CVS VITAMIN B12 1000 MCG tablet, TAKE 1 TABLET BY MOUTH EVERY DAY (Patient taking differently: Take 1,000 mcg by mouth daily.)  Current Outpatient Medications (Other):    Accu-Chek Softclix Lancets lancets, Check blood sugar 1 time per day   amitriptyline (ELAVIL) 25 MG tablet, Take 1 tablet (25 mg total) by mouth at bedtime.   cyclobenzaprine (FLEXERIL) 5 MG tablet, Take 1 tablet (5 mg total) by mouth at bedtime.    esomeprazole (NEXIUM) 40 MG capsule, TAKE 1 CAPSULE (40 MG TOTAL) BY MOUTH DAILY.   gabapentin (NEURONTIN) 600 MG tablet, Take 1 tablet (600 mg total) by mouth 3 (three) times daily.   glucose blood (ACCU-CHEK GUIDE) test strip, CHECK BLOOD SUGAR 1 TIME PER DAY   Misc. Devices MISC, Provide patient with shower chair and grab bar. ICD10 M17.10 M54.16   multivitamin-iron-minerals-folic acid (CENTRUM) chewable tablet, Chew 1 tablet by mouth daily.   naloxegol oxalate (MOVANTIK) 25 MG TABS tablet, Take 1 tablet (25 mg total) by mouth daily.   nystatin (NYSTATIN) powder, Apply 1 application topically 3 (three) times daily.   nystatin cream (MYCOSTATIN), Apply 1 Application topically 2 (two) times daily.   ondansetron (ZOFRAN) 4 MG  tablet, Take 1 tablet (4 mg total) by mouth every 8 (eight) hours as needed for nausea or vomiting.   PREMARIN vaginal cream, PLACE 1 APPLICATORFUL VAGINALLY DAILY. (Patient taking differently: 1 applicator 2 (two) times a week.)   triamcinolone (KENALOG) 0.025 % ointment, Apply 1 Application topically 2 (two) times daily.   zolpidem (AMBIEN) 5 MG tablet, Take 1 tablet (5 mg total) by mouth at bedtime.  Medical History:  Past Medical History:  Diagnosis Date   Asthma    Back pain    Chronic interstitial cystitis    Followed by Dr. Marcine Matar   COPD (chronic obstructive pulmonary disease)    DDD (degenerative disc disease), lumbar    Degeneration of cervical intervertebral disc    Depression    Diabetes mellitus, type 2 2007   Heme positive stool    EGD on 04/08/2010 by Dr. Ewing Schlein showed chronic gastritis and a few gastric polyps; pathology showed a fundic gland polyp.  Colonoscopy on 04/08/2010 showed small external and internal hemorrhoids, and a few benign-appearing diminutive polyps in the rectum, the distal sigmoid colon, and in the distal descending colon; pathology showed hyperplastic polyps.    Hypertension    Hypothyroidism    Iron deficiency anemia     Spinal stenosis    Allergies:  Allergies  Allergen Reactions   Amoxicillin Other (See Comments)   Codeine Sulfate Other (See Comments)    Confusion, anxiety, fatigue   Meperidine And Related Nausea And Vomiting   Minocycline Hcl Nausea And Vomiting    Hospitalized for vomiting along with allergic reaction   Penicillins Other (See Comments)    Has patient had a PCN reaction causing immediate rash, facial/tongue/throat swelling, SOB or lightheadedness with hypotension: Yes Has patient had a PCN reaction causing severe rash involving mucus membranes or skin necrosis: Yes Has patient had a PCN reaction that required hospitalization Yes Has patient had a PCN reaction occurring within the last 10 years: Yes If all of the above answers are "NO", then may proceed with Cephalosporin use.    Propoxyphene N-Acetaminophen Other (See Comments)    confused   Tetracycline Hcl Other (See Comments)    Vomiting Note: Patient took a course of doxycycline in June 2014 without any problems.     Surgical History:  She  has a past surgical history that includes Carpal tunnel release (05/08/2000). Family History:  Her family history includes Diabetes in her mother and sister; Heart attack (age of onset: 62) in her father; Hypertension in her mother.  REVIEW OF SYSTEMS  : All other systems reviewed and negative except where noted in the History of Present Illness.  PHYSICAL EXAM: LMP 01/17/2009  General Appearance: Well nourished, in no apparent distress. Head:   Normocephalic and atraumatic. Eyes:  sclerae anicteric,conjunctive pink  Respiratory: Respiratory effort normal, BS equal bilaterally without rales, rhonchi, wheezing. Cardio: RRR with no MRGs. Peripheral pulses intact.  Abdomen: Soft,  {BlankSingle:19197::"Flat","Obese","Non-distended"} ,active bowel sounds. {actendernessAB:27319} tenderness {anatomy; site abdomen:5010}. {BlankMultiple:19196::"Without guarding","With guarding","Without  rebound","With rebound"}. No masses. Rectal: {acrectalexam:27461} Musculoskeletal: Full ROM, {PSY - GAIT AND STATION:22860} gait. {With/Without:[id number]} edema. Skin:  Dry and intact without significant lesions or rashes Neuro: Alert and  oriented x4;  No focal deficits. Psych:  Cooperative. Normal mood and affect.    Doree Albee, PA-C 10:38 AM

## 2023-03-05 NOTE — Progress Notes (Unsigned)
Requested by:  Claiborne Rigg, NP [address] [place],  [place] [address]  Reason for consultation: ***    History of Present Illness   Mary Gray is a 62 y.o. (Oct 27, 1961) female who presents for evaluation of ***  Venous symptoms include: (aching, heavy, tired, throbbing, burning, itching, swelling, bleeding, ulcer)  *** Onset/duration:  ***  Occupation:  *** Aggravating factors: (sitting, standing) Alleviating factors: (elevation) Compression:  *** Helps:  *** Pain medications:  *** Previous vein procedures:  *** History of DVT:  ***  Past Medical History:  Diagnosis Date   Asthma    Back pain    Chronic interstitial cystitis    Followed by Dr. Marcine Matar   COPD (chronic obstructive pulmonary disease)    DDD (degenerative disc disease), lumbar    Degeneration of cervical intervertebral disc    Depression    Diabetes mellitus, type 2 2007   Heme positive stool    EGD on 04/08/2010 by Dr. Ewing Schlein showed chronic gastritis and a few gastric polyps; pathology showed a fundic gland polyp.  Colonoscopy on 04/08/2010 showed small external and internal hemorrhoids, and a few benign-appearing diminutive polyps in the rectum, the distal sigmoid colon, and in the distal descending colon; pathology showed hyperplastic polyps.    Hypertension    Hypothyroidism    Iron deficiency anemia    Spinal stenosis     Past Surgical History:  Procedure Laterality Date   CARPAL TUNNEL RELEASE  05/08/2000   By Dr. Katy Fitch. Sypher, Montez Hageman.    Social History   Socioeconomic History   Marital status: Single    Spouse name: Not on file   Number of children: Not on file   Years of education: Not on file   Highest education level: Not on file  Occupational History   Not on file  Tobacco Use   Smoking status: Some Days    Packs/day: 0.50    Years: 45.00    Additional pack years: 0.00    Total pack years: 22.50    Types: Cigarettes   Smokeless tobacco: Never    Tobacco comments:    1/2 pack per day  Vaping Use   Vaping Use: Never used  Substance and Sexual Activity   Alcohol use: No   Drug use: No   Sexual activity: Not Currently  Other Topics Concern   Not on file  Social History Narrative   Not on file   Social Determinants of Health   Financial Resource Strain: Medium Risk (01/16/2023)   Overall Financial Resource Strain (CARDIA)    Difficulty of Paying Living Expenses: Somewhat hard  Food Insecurity: Food Insecurity Present (01/16/2023)   Hunger Vital Sign    Worried About Running Out of Food in the Last Year: Often true    Ran Out of Food in the Last Year: Often true  Transportation Needs: No Transportation Needs (01/31/2023)   PRAPARE - Administrator, Civil Service (Medical): No    Lack of Transportation (Non-Medical): No  Recent Concern: Transportation Needs - Unmet Transportation Needs (01/16/2023)   PRAPARE - Transportation    Lack of Transportation (Medical): Yes    Lack of Transportation (Non-Medical): Yes  Physical Activity: Sufficiently Active (01/16/2023)   Exercise Vital Sign    Days of Exercise per Week: 5 days    Minutes of Exercise per Session: 30 min  Stress: Stress Concern Present (01/16/2023)   [employer] of Occupational Health - Occupational  Stress Questionnaire    Feeling of Stress : To some extent  Social Connections: Moderately Isolated (01/16/2023)   Social Connection and Isolation Panel [NHANES]    Frequency of Communication with Friends and Family: More than three times a week    Frequency of Social Gatherings with Friends and Family: Once a week    Attends Religious Services: Never    Database administrator or Organizations: No    Attends Banker Meetings: Never    Marital Status: Living with partner  Intimate Partner Violence: Not At Risk (01/16/2023)   Humiliation, Afraid, Rape, and Kick questionnaire    Fear of Current or Ex-Partner: No    Emotionally Abused: No     Physically Abused: No    Sexually Abused: No   *** Family History  Problem Relation Age of Onset   Heart attack Father 18   Diabetes Mother    Hypertension Mother    Diabetes Sister    Breast cancer Neg Hx    Colon cancer Neg Hx     Current Outpatient Medications  Medication Sig Dispense Refill   Accu-Chek Softclix Lancets lancets Check blood sugar 1 time per day 100 each 3   acetaminophen (TYLENOL) 500 MG tablet Take 1,000 mg by mouth every 6 (six) hours as needed for mild pain.     albuterol (PROVENTIL) (2.5 MG/3ML) 0.083% nebulizer solution Take 3 mLs (2.5 mg total) by nebulization every 6 (six) hours as needed for wheezing or shortness of breath. 150 mL 1   albuterol (VENTOLIN HFA) 108 (90 Base) MCG/ACT inhaler INHALE 1-2 PUFFS BY MOUTH EVERY 6 HOURS AS NEEDED FOR WHEEZE OR SHORTNESS OF BREATH 54 each 0   amitriptyline (ELAVIL) 25 MG tablet Take 1 tablet (25 mg total) by mouth at bedtime. 90 tablet 3   amLODipine (NORVASC) 5 MG tablet Take 1 tablet (5 mg total) by mouth daily. 90 tablet 1   aspirin EC 81 MG tablet Take 81 mg by mouth daily. Swallow whole.     benzonatate (TESSALON) 100 MG capsule Take 2 capsules (200 mg total) by mouth every 8 (eight) hours for 5 days. 30 capsule 0   CVS VITAMIN B12 1000 MCG tablet TAKE 1 TABLET BY MOUTH EVERY DAY (Patient taking differently: Take 1,000 mcg by mouth daily.) 90 tablet 3   cyclobenzaprine (FLEXERIL) 5 MG tablet Take 1 tablet (5 mg total) by mouth at bedtime. 30 tablet 0   dextromethorphan-guaiFENesin (TUSSIN DM) 10-100 MG/5ML liquid Take 10 mLs by mouth every 6 (six) hours as needed for cough. 500 mL 0   enalapril (VASOTEC) 20 MG tablet Take 1 tablet (20 mg total) by mouth daily. 30 tablet 5   esomeprazole (NEXIUM) 40 MG capsule TAKE 1 CAPSULE (40 MG TOTAL) BY MOUTH DAILY. 90 capsule 1   fluticasone-salmeterol (WIXELA INHUB) 250-50 MCG/ACT AEPB Inhale 1 puff into the lungs in the morning and at bedtime. 60 each 6   gabapentin  (NEURONTIN) 600 MG tablet Take 1 tablet (600 mg total) by mouth 3 (three) times daily. 90 tablet 1   glucose blood (ACCU-CHEK GUIDE) test strip CHECK BLOOD SUGAR 1 TIME PER DAY 100 strip 3   ibuprofen (ADVIL) 200 MG tablet Take 400 mg by mouth every 6 (six) hours as needed for fever, headache or mild pain.     levothyroxine (SYNTHROID) 88 MCG tablet Take 1 tablet (88 mcg total) by mouth daily before breakfast. 90 tablet 2   metFORMIN (GLUCOPHAGE-XR) 500 MG 24 hr  tablet Take 2 tablets (1,000 mg total) by mouth 2 (two) times daily. 360 tablet 1   Misc. Devices MISC Provide patient with shower chair and grab bar. ICD10 M17.10 M54.16 1 each 0   multivitamin-iron-minerals-folic acid (CENTRUM) chewable tablet Chew 1 tablet by mouth daily. 90 tablet 3   naloxegol oxalate (MOVANTIK) 25 MG TABS tablet Take 1 tablet (25 mg total) by mouth daily. 90 tablet 1   nystatin (NYSTATIN) powder Apply 1 application topically 3 (three) times daily. 15 g 0   nystatin cream (MYCOSTATIN) Apply 1 Application topically 2 (two) times daily. 60 g 3   ondansetron (ZOFRAN) 4 MG tablet Take 1 tablet (4 mg total) by mouth every 8 (eight) hours as needed for nausea or vomiting. 30 tablet 1   predniSONE (DELTASONE) 20 MG tablet Take 2 tablets (40 mg total) by mouth daily. 10 tablet 0   PREMARIN vaginal cream PLACE 1 APPLICATORFUL VAGINALLY DAILY. (Patient taking differently: 1 applicator 2 (two) times a week.) 30 g 1   promethazine-dextromethorphan (PROMETHAZINE-DM) 6.25-15 MG/5ML syrup Take 5 mLs by mouth 4 (four) times daily as needed for cough. 240 mL 0   rosuvastatin (CRESTOR) 20 MG tablet Take 1 tablet (20 mg total) by mouth daily. 90 tablet 3   Tiotropium Bromide Monohydrate (SPIRIVA RESPIMAT) 2.5 MCG/ACT AERS Inhale 2 puffs into the lungs daily. 4 g 0   triamcinolone (KENALOG) 0.025 % ointment Apply 1 Application topically 2 (two) times daily. 30 g 0   zolpidem (AMBIEN) 5 MG tablet Take 1 tablet (5 mg total) by mouth at  bedtime. 30 tablet 1   No current facility-administered medications for this visit.    Allergies  Allergen Reactions   Amoxicillin Other (See Comments)   Codeine Sulfate Other (See Comments)    Confusion, anxiety, fatigue   Meperidine And Related Nausea And Vomiting   Minocycline Hcl Nausea And Vomiting    Hospitalized for vomiting along with allergic reaction   Penicillins Other (See Comments)    Has patient had a PCN reaction causing immediate rash, facial/tongue/throat swelling, SOB or lightheadedness with hypotension: Yes Has patient had a PCN reaction causing severe rash involving mucus membranes or skin necrosis: Yes Has patient had a PCN reaction that required hospitalization Yes Has patient had a PCN reaction occurring within the last 10 years: Yes If all of the above answers are "NO", then may proceed with Cephalosporin use.    Propoxyphene N-Acetaminophen Other (See Comments)    confused   Tetracycline Hcl Other (See Comments)    Vomiting Note: Patient took a course of doxycycline in June 2014 without any problems.    ***REVIEW OF SYSTEMS (negative unless checked):   Cardiac:   Chest pain or chest pressure?  Shortness of breath upon activity?  Shortness of breath when lying flat?  Irregular heart rhythm?  Vascular:   Pain in calf, thigh, or hip brought on by walking?  Pain in feet at night that wakes you up from your sleep?  Blood clot in your veins?  Leg swelling?  Pulmonary:   Oxygen at home?  Productive cough?  Wheezing?  Neurologic:   Sudden weakness in arms or legs?  Sudden numbness in arms or legs?  Sudden onset of difficult speaking or slurred speech?  Temporary loss of vision in one eye?  Problems with dizziness?  Gastrointestinal:   Blood in stool?  Vomited blood?  Genitourinary:   Burning when urinating?  Blood in urine?  Psychiatric:   Major depression  Hematologic:   Bleeding  problems?  Problems with blood clotting?  Dermatologic:   Rashes or ulcers?  Constitutional:   Fever or chills?  Ear/Nose/Throat:   Change in hearing?  Nose bleeds?  Sore throat?  Musculoskeletal:   Back pain?  Joint pain?  Muscle pain?   Physical Examination    There were no vitals filed for this visit. There is no height or weight on file to calculate BMI.  General:  WDWN in NAD; vital signs documented above Gait: Not observed HENT: WNL, normocephalic Pulmonary: normal non-labored breathing , without Rales, rhonchi,  wheezing Cardiac: {Desc; regular/irreg:14544} HR, without  Murmurs {With/Without:20273} carotid bruit*** Abdomen: soft, NT, no masses Skin: {With/Without:20273} rashes Vascular Exam/Pulses:  Right Left  Radial {Exam; arterial pulse strength 0-4:30167} {Exam; arterial pulse strength 0-4:30167}  Ulnar {Exam; arterial pulse strength 0-4:30167} {Exam; arterial pulse strength 0-4:30167}  Femoral {Exam; arterial pulse strength 0-4:30167} {Exam; arterial pulse strength 0-4:30167}  Popliteal {Exam; arterial pulse strength 0-4:30167} {Exam; arterial pulse strength 0-4:30167}  DP {Exam; arterial pulse strength 0-4:30167} {Exam; arterial pulse strength 0-4:30167}  PT {Exam; arterial pulse strength 0-4:30167} {Exam; arterial pulse strength 0-4:30167}   Extremities: {With/Without:20273} varicose veins, {With/Without:20273} reticular veins, {With/Without:20273} edema, {With/Without:20273} stasis pigmentation, {With/Without:20273} lipodermatosclerosis, {With/Without:20273} ulcers Musculoskeletal: no muscle wasting or atrophy  Neurologic: A&O X 3;  No focal weakness or paresthesias are detected Psychiatric:  The pt has {Desc; normal/abnormal:11317::"Normal"} affect.  Non-invasive Vascular Imaging   BLE Venous Insufficiency Duplex (***):  RLE:  *** DVT and SVT,  *** GSV reflux ***, GSV diameter *** *** SSV reflux ***, *** deep venous  reflux  LLE: *** DVT and SVT,  *** GSV reflux ***,  GSV diameter *** *** SSV reflux ***, *** deep venous reflux   Medical Decision Making   Mary Gray is a 62 y.o. female who presents with: ***LE chronic venous insufficiency, ***varicose veins with complications  Based on the patient's history and examination, I recommend: ***. I discussed with the patient the use of her 20-30 mm thigh high compression stockings and need for 3 month trial of such. The patient will follow up in 3 months with Dr. Marland Kitchen Thank you for allowing [place] to participate in this patient's care.   Graceann Congress, PA-C [facility] Office: [phone]  03/05/2023, 8:10 AM  Clinic MD: Edilia Bo

## 2023-03-06 ENCOUNTER — Ambulatory Visit: Payer: Medicare HMO | Admitting: Physician Assistant

## 2023-03-08 ENCOUNTER — Ambulatory Visit (HOSPITAL_COMMUNITY): Payer: Medicare HMO

## 2023-03-08 ENCOUNTER — Ambulatory Visit: Payer: Medicare HMO

## 2023-03-11 ENCOUNTER — Other Ambulatory Visit: Payer: Self-pay | Admitting: Family Medicine

## 2023-03-12 NOTE — Telephone Encounter (Signed)
Requested Prescriptions  Pending Prescriptions Disp Refills   albuterol (VENTOLIN HFA) 108 (90 Base) MCG/ACT inhaler [Pharmacy Med Name: ALBUTEROL HFA (PROAIR) INHALER] 8.5 each 0    Sig: INHALE 1-2 PUFFS BY MOUTH EVERY 6 HOURS AS NEEDED FOR WHEEZE OR SHORTNESS OF BREATH     Pulmonology:  Beta Agonists 2 Passed - 03/11/2023  2:56 AM      Passed - Last BP in normal range    BP Readings from Last 1 Encounters:  02/28/23 124/65         Passed - Last Heart Rate in normal range    Pulse Readings from Last 1 Encounters:  02/28/23 (!) 102         Passed - Valid encounter within last 12 months    Recent Outpatient Visits           1 month ago Chronic venous insufficiency   [facility] [place], [place], NP   2 months ago Primary hypertension   [facility] [place], Shea Stakes, NP   5 months ago Chronic constipation   [facility] [place], Marylene Land M, [place]   10 months ago Bronchitis   [facility] [place], Shea Stakes, NP   10 months ago Encounter to establish care   [facility] Claiborne Rigg, NP       Future Appointments             In 3 weeks Claiborne Rigg, NP [facility]   In 1 month [place], Deloris Ping, MD [facility]   In 1 month Claiborne Rigg, NP [facility]

## 2023-03-14 ENCOUNTER — Ambulatory Visit: Payer: Self-pay

## 2023-03-14 ENCOUNTER — Other Ambulatory Visit: Payer: Self-pay | Admitting: Nurse Practitioner

## 2023-03-14 DIAGNOSIS — R110 Nausea: Secondary | ICD-10-CM

## 2023-03-14 NOTE — Telephone Encounter (Signed)
  Chief Complaint: nausea Symptoms: nausea, abdominal cramps comes and goes Frequency: 2 days Pertinent Negatives: Patient denies vomiting or diarrhea Disposition: ED /[] Urgent Care (no appt availability in office) / Appointment(In office/virtual)/  [facility]/ Home Care/ Refused Recommended Disposition /[] [facility]/  Follow-up with PCP Additional Notes: pt is asking for refill on Zofran for nausea d/t she feels like she has a stomach virus. Pt hasn't been able to eat but tolerating fluids. Advised would send RF to provider and pt has upcoming appt on 04/06/23, added to wait list. Care advice given and pt verbalized understanding. No other questions/concerns noted.   ondansetron (ZOFRAN) 4 MG tablet 30 tablet 1 01/03/2023    Sig - Route: Take 1 tablet (4 mg total) by mouth every 8 (eight) hours as needed for nausea or vomiting. - Oral   Sent to pharmacy as: ondansetron (ZOFRAN) 4 MG tablet   Notes to Pharmacy: Please keep upcoming visit for more refills.   E-Prescribing Status: Receipt confirmed by pharmacy (01/03/2023  4:13 PM EST)    Summary: stomach bug?   Pt states that she think she caught a stomach bug, she is nauseated and feeling like she has to go to the bathroom. Sometimes something comes out and sometimes it does not. Pt is wanting to see if can get nausea pills called into the pharmacy for her. Please advise.  [id number] [place], [place] - [address] AT [facility] [address] Phone: [phone] Fax: [phone]         Reason for Disposition  Unexplained nausea  Answer Assessment - Initial Assessment Questions 1. NAUSEA SEVERITY: "How bad is the nausea?" (e.g., mild, moderate, severe; dehydration, weight loss)   - MILD: loss of appetite without change in eating habits   - MODERATE: decreased oral intake without significant weight loss, dehydration, or malnutrition   - SEVERE: inadequate caloric or fluid intake,  significant weight loss, symptoms of dehydration     Mild to moderate  2. ONSET: "When did the nausea begin?"     2 days  3. VOMITING: "Any vomiting?" If Yes, ask: "How many times today?"     no 4. RECURRENT SYMPTOM: "Have you had nausea before?" If Yes, ask: "When was the last time?" "What happened that time?"     Zofran  5. CAUSE: "What do you think is causing the nausea?"     GI bug  Protocols used: Nausea-A-AH

## 2023-03-15 NOTE — Telephone Encounter (Signed)
Requested medication (s) are due for refill today - yes  Requested medication (s) are on the active medication list -yes  Future visit scheduled -yes  Last refill: 01/03/23 #30 1RF  Notes to clinic: non delegated Rx  Requested Prescriptions  Pending Prescriptions Disp Refills   ondansetron (ZOFRAN) 4 MG tablet [Pharmacy Med Name: ONDANSETRON HCL 4 MG TABLET] 30 tablet 1    Sig: TAKE 1 TABLET BY MOUTH EVERY 8 HOURS AS NEEDED FOR NAUSEA AND VOMITING     Not Delegated - Gastroenterology: Antiemetics - ondansetron Failed - 03/14/2023  6:24 PM      Failed - This refill cannot be delegated      Failed - AST in normal range and within 360 days    AST  Date Value Ref Range Status  02/28/2023 44 (H) 15 - 41 U/L Final         Passed - ALT in normal range and within 360 days    ALT  Date Value Ref Range Status  02/28/2023 23 0 - 44 U/L Final         Passed - Valid encounter within last 6 months    Recent Outpatient Visits           1 month ago Chronic venous insufficiency   [facility] [place], Shea Stakes, NP   2 months ago Primary hypertension   [place] [facility] [place], [place], NP   5 months ago Chronic constipation   [facility] [place], Marylene Land M, [place]   10 months ago Bronchitis   [place] [facility] [place], Shea Stakes, NP   11 months ago Encounter to establish care   [facility] Claiborne Rigg, NP       Future Appointments             In 3 weeks Claiborne Rigg, NP [place] [facility]   In 1 month Nahser, Deloris Ping, MD [place] [facility]   In 1 month Claiborne Rigg, NP [facility]               Requested Prescriptions  Pending Prescriptions Disp Refills   ondansetron (ZOFRAN) 4 MG tablet [Pharmacy Med Name:  ONDANSETRON HCL 4 MG TABLET] 30 tablet 1    Sig: TAKE 1 TABLET BY MOUTH EVERY 8 HOURS AS NEEDED FOR NAUSEA AND VOMITING     Not Delegated - Gastroenterology: Antiemetics - ondansetron Failed - 03/14/2023  6:24 PM      Failed - This refill cannot be delegated      Failed - AST in normal range and within 360 days    AST  Date Value Ref Range Status  02/28/2023 44 (H) 15 - 41 U/L Final         Passed - ALT in normal range and within 360 days    ALT  Date Value Ref Range Status  02/28/2023 23 0 - 44 U/L Final         Passed - Valid encounter within last 6 months    Recent Outpatient Visits           1 month ago Chronic venous insufficiency    [facility] [place], Shea Stakes, NP   2 months ago Primary hypertension   [facility] [place], Shea Stakes, NP   5 months ago Chronic constipation   [facility] [place], Marylene Land M, [place]   10 months ago Bronchitis   [facility] [place], Shea Stakes, NP   11 months ago Encounter to establish care   [facility] Claiborne Rigg, NP       Future Appointments             In 3 weeks Claiborne Rigg, NP [facility]   In 1 month Nahser, Deloris Ping, MD [facility]   In 1 month Claiborne Rigg, NP [facility]

## 2023-03-20 ENCOUNTER — Other Ambulatory Visit: Payer: Self-pay | Admitting: Family Medicine

## 2023-03-20 ENCOUNTER — Other Ambulatory Visit: Payer: Self-pay | Admitting: Nurse Practitioner

## 2023-03-20 DIAGNOSIS — R110 Nausea: Secondary | ICD-10-CM

## 2023-03-20 DIAGNOSIS — F418 Other specified anxiety disorders: Secondary | ICD-10-CM

## 2023-03-21 ENCOUNTER — Other Ambulatory Visit: Payer: Self-pay | Admitting: Nurse Practitioner

## 2023-03-21 NOTE — Telephone Encounter (Signed)
Requested medication (s) are due for refill today - yes  Requested medication (s) are on the active medication list -yes  Future visit scheduled -yes  Last refill: 01/22/23 #30 1RF  Notes to clinic: non delegated Rx  Requested Prescriptions  Pending Prescriptions Disp Refills   zolpidem (AMBIEN) 5 MG tablet [Pharmacy Med Name: ZOLPIDEM TARTRATE 5 MG TABLET] 30 tablet 1    Sig: TAKE 1 TABLET BY MOUTH EVERYDAY AT BEDTIME     Not Delegated - Psychiatry:  Anxiolytics/Hypnotics Failed - 03/20/2023  3:29 PM      Failed - This refill cannot be delegated      Failed - Urine Drug Screen completed in last 360 days      Passed - Valid encounter within last 6 months    Recent Outpatient Visits           1 month ago Chronic venous insufficiency   [facility] [place], Shea Stakes, NP   2 months ago Primary hypertension   [facility] [place], Shea Stakes, NP   5 months ago Chronic constipation   [facility] [place], Marylene Land M, [place]   10 months ago Bronchitis   [facility] [place], Shea Stakes, NP   11 months ago Encounter to establish care   [facility] Claiborne Rigg, NP       Future Appointments             In 2 weeks Claiborne Rigg, NP [facility]   In 4 weeks Nahser, Deloris Ping, MD [facility], [facility]   In 1 month Claiborne Rigg, NP [facility]               Requested Prescriptions  Pending Prescriptions Disp Refills   zolpidem (AMBIEN) 5 MG tablet [Pharmacy Med Name: ZOLPIDEM TARTRATE 5 MG TABLET] 30 tablet 1    Sig: TAKE 1 TABLET BY MOUTH EVERYDAY AT BEDTIME     Not Delegated - Psychiatry:  Anxiolytics/Hypnotics Failed - 03/20/2023  3:29 PM      Failed - This refill cannot be delegated      Failed -  Urine Drug Screen completed in last 360 days      Passed - Valid encounter within last 6 months    Recent Outpatient Visits           1 month ago Chronic venous insufficiency   [place] [facility] [place], Shea Stakes, NP   2 months ago Primary hypertension   [place] [facility] [place], Shea Stakes, NP   5 months ago Chronic constipation   [facility] [place], Marylene Land M, [place]   10 months ago Bronchitis   [facility] [place], Shea Stakes, NP   11 months ago Encounter to establish care   [facility] Claiborne Rigg, NP       Future Appointments             In 2 weeks Claiborne Rigg, NP [facility]   In 4 weeks Nahser, Deloris Ping, MD [facility]   In 1 month Claiborne Rigg, NP [facility]

## 2023-03-22 ENCOUNTER — Ambulatory Visit: Payer: Self-pay

## 2023-03-22 NOTE — Telephone Encounter (Signed)
Summary: medicine requested   Patient said she has been having nausea, headache, red eyes, coughing and feeling weak. Hurt in her neck and shoulders from coughing so much. She is requesting prednisone or anything be called in to boost her energy and help with cough.     Chief Complaint: Cough,headache, wheezing.Asking for Prednisone and cough medication. Declines OV. "I feel too bad to come in." Symptoms: Above Frequency: Monday Pertinent Negatives: Patient denies  Disposition: [] ED /[] Urgent Care (no appt availability in office) / [] Appointment(In office/virtual)/ []  [facility]/ [] Home Care/ [] Refused Recommended Disposition /[]  Mobile Bus/ [x]  Follow-up with PCP Additional Notes: Please advise pt.  Answer Assessment - Initial Assessment Questions 1. ONSET: "When did the cough begin?"      Monday 2. SEVERITY: "How bad is the cough today?"      Severe 3. SPUTUM: "Describe the color of your sputum" (none, dry cough; clear, white, yellow, green)     None 4. HEMOPTYSIS: "Are you coughing up any blood?" If so ask: "How much?" (flecks, streaks, tablespoons, etc.)     No 5. DIFFICULTY BREATHING: "Are you having difficulty breathing?" If Yes, ask: "How bad is it?" (e.g., mild, moderate, severe)    - MILD: No SOB at rest, mild SOB with walking, speaks normally in sentences, can lie down, no retractions, pulse < 100.    - MODERATE: SOB at rest, SOB with minimal exertion and prefers to sit, cannot lie down flat, speaks in phrases, mild retractions, audible wheezing, pulse 100-120.    - SEVERE: Very SOB at rest, speaks in single words, struggling to breathe, sitting hunched forward, retractions, pulse > 120      Mild 6. FEVER: "Do you have a fever?" If Yes, ask: "What is your temperature, how was it measured, and when did it start?"     Unsure 7. CARDIAC HISTORY: "Do you have any history of heart disease?" (e.g., heart attack, congestive heart failure)      No 8. LUNG  HISTORY: "Do you have any history of lung disease?"  (e.g., pulmonary embolus, asthma, emphysema)     COPD 9. PE RISK FACTORS: "Do you have a history of blood clots?" (or: recent major surgery, recent prolonged travel, bedridden)     No 10. OTHER SYMPTOMS: "Do you have any other symptoms?" (e.g., runny nose, wheezing, chest pain)       Wheezing 11. PREGNANCY: "Is there any chance you are pregnant?" "When was your last menstrual period?"       No 12. TRAVEL: "Have you traveled out of the country in the last month?" (e.g., travel history, exposures)       No  Protocols used: Cough - Acute Non-Productive-A-AH

## 2023-03-23 ENCOUNTER — Ambulatory Visit (INDEPENDENT_AMBULATORY_CARE_PROVIDER_SITE_OTHER): Payer: Medicare HMO

## 2023-03-23 ENCOUNTER — Ambulatory Visit (HOSPITAL_COMMUNITY)
Admission: RE | Admit: 2023-03-23 | Discharge: 2023-03-23 | Disposition: A | Discharge status: Home / Self Care | Payer: Medicare HMO | Source: Ambulatory Visit | Attending: Urgent Care | Admitting: Urgent Care

## 2023-03-23 ENCOUNTER — Encounter: Payer: Self-pay | Admitting: Licensed Clinical Social Worker

## 2023-03-23 ENCOUNTER — Encounter (HOSPITAL_COMMUNITY): Payer: Self-pay

## 2023-03-23 ENCOUNTER — Telehealth: Payer: Self-pay | Admitting: Nurse Practitioner

## 2023-03-23 VITALS — BP 115/71 | HR 98 | Temp 98.1°F | Resp 20

## 2023-03-23 DIAGNOSIS — J4540 Moderate persistent asthma, uncomplicated: Secondary | ICD-10-CM | POA: Diagnosis not present

## 2023-03-23 DIAGNOSIS — J449 Chronic obstructive pulmonary disease, unspecified: Secondary | ICD-10-CM

## 2023-03-23 DIAGNOSIS — R062 Wheezing: Secondary | ICD-10-CM | POA: Diagnosis not present

## 2023-03-23 DIAGNOSIS — R059 Cough, unspecified: Secondary | ICD-10-CM | POA: Diagnosis not present

## 2023-03-23 LAB — POCT FASTING CBG KUC MANUAL ENTRY: POCT Glucose (KUC): 246 mg/dL — AB (ref 70–99)

## 2023-03-23 MED ORDER — PROMETHAZINE-DM 6.25-15 MG/5ML PO SYRP: 5.0000 mL | ORAL_SOLUTION | Freq: Three times a day (TID) | ORAL | 0 refills | Status: DC | PRN

## 2023-03-23 MED ORDER — LEVOCETIRIZINE DIHYDROCHLORIDE 5 MG PO TABS: 5.0000 mg | ORAL_TABLET | Freq: Every evening | ORAL | 5 refills | Status: AC

## 2023-03-23 MED ORDER — IPRATROPIUM-ALBUTEROL 0.5-2.5 (3) MG/3ML IN SOLN: 3.0000 mL | RESPIRATORY_TRACT | 0 refills | Status: DC | PRN

## 2023-03-23 MED ORDER — AZITHROMYCIN 250 MG PO TABS: ORAL_TABLET | ORAL | 0 refills | Status: DC

## 2023-03-23 MED ORDER — ALBUTEROL SULFATE HFA 108 (90 BASE) MCG/ACT IN AERS: 1.0000 | INHALATION_SPRAY | RESPIRATORY_TRACT | 0 refills | Status: DC | PRN

## 2023-03-23 NOTE — ED Triage Notes (Signed)
Chief Complaint: Cough, Congestion, Wheezing, eye irritation   Onset: Monday   Prescriptions or OTC medications tried: Yes- vysine eye drops, clear eyes, albuterol inhaler, nebulizer, nyquil     with little relief  Sick exposure: No  New foods, medications, or products: No  Recent Travel: No

## 2023-03-23 NOTE — Discharge Instructions (Addendum)
Make sure you are taking your diabetes medication. Use your nebulizer with the duo neb treatment.

## 2023-03-23 NOTE — Telephone Encounter (Signed)
Copied from CRM [id number]. Topic: General - Other >> Mar 22, 2023  5:10 PM Ja-Kwan M wrote: Reason for CRM: Pt called once again requesting an update on her request for prednisone. Pt requests call back asap.

## 2023-03-23 NOTE — ED Provider Notes (Signed)
[facility]   MRN: [id number] DOB: 1961/02/05  Subjective:   Mary Gray is a 62 y.o. female with past medical history of COPD, asthma presenting for 5 day history of coughing, wheezing, difficulty sleeping from coughing. Cough is productive and causes chest congestion. No fever, chest pain. She is supposed to be on daily maintenance treatments for her COPD and asthma, takes Wixela and uses albuterol as needed.  Last dosing of prednisone was in mid March.  Patient is managed without insulin for her type 2 diabetes.  No current facility-administered medications for this encounter.  Current Outpatient Medications:    Accu-Chek Softclix Lancets lancets, Check blood sugar 1 time per day, Disp: 100 each, Rfl: 3   acetaminophen (TYLENOL) 500 MG tablet, Take 1,000 mg by mouth every 6 (six) hours as needed for mild pain., Disp: , Rfl:    albuterol (PROVENTIL) (2.5 MG/3ML) 0.083% nebulizer solution, Take 3 mLs (2.5 mg total) by nebulization every 6 (six) hours as needed for wheezing or shortness of breath., Disp: 150 mL, Rfl: 1   albuterol (VENTOLIN HFA) 108 (90 Base) MCG/ACT inhaler, INHALE 1-2 PUFFS BY MOUTH EVERY 6 HOURS AS NEEDED FOR WHEEZE OR SHORTNESS OF BREATH, Disp: 8.5 each, Rfl: 0   amitriptyline (ELAVIL) 25 MG tablet, Take 1 tablet (25 mg total) by mouth at bedtime., Disp: 90 tablet, Rfl: 3   amLODipine (NORVASC) 5 MG tablet, Take 1 tablet (5 mg total) by mouth daily., Disp: 90 tablet, Rfl: 1   aspirin EC 81 MG tablet, Take 81 mg by mouth daily. Swallow whole., Disp: , Rfl:    [employer] VITAMIN B12 1000 MCG tablet, TAKE 1 TABLET BY MOUTH EVERY DAY (Patient taking differently: Take 1,000 mcg by mouth daily.), Disp: 90 tablet, Rfl: 3   cyclobenzaprine (FLEXERIL) 5 MG tablet, Take 1 tablet (5 mg total) by mouth at bedtime., Disp: 30 tablet, Rfl: 0   dextromethorphan-guaiFENesin (TUSSIN DM) 10-100 MG/5ML liquid, Take 10 mLs by mouth every 6 (six) hours as needed for cough., Disp:  500 mL, Rfl: 0   enalapril (VASOTEC) 20 MG tablet, Take 1 tablet (20 mg total) by mouth daily., Disp: 30 tablet, Rfl: 5   esomeprazole (NEXIUM) 40 MG capsule, TAKE 1 CAPSULE (40 MG TOTAL) BY MOUTH DAILY., Disp: 90 capsule, Rfl: 1   fluticasone-salmeterol (WIXELA INHUB) 250-50 MCG/ACT AEPB, Inhale 1 puff into the lungs in the morning and at bedtime., Disp: 60 each, Rfl: 6   gabapentin (NEURONTIN) 600 MG tablet, Take 1 tablet (600 mg total) by mouth 3 (three) times daily., Disp: 90 tablet, Rfl: 1   glucose blood (ACCU-CHEK GUIDE) test strip, CHECK BLOOD SUGAR 1 TIME PER DAY, Disp: 100 strip, Rfl: 3   ibuprofen (ADVIL) 200 MG tablet, Take 400 mg by mouth every 6 (six) hours as needed for fever, headache or mild pain., Disp: , Rfl:    levothyroxine (SYNTHROID) 88 MCG tablet, Take 1 tablet (88 mcg total) by mouth daily before breakfast., Disp: 90 tablet, Rfl: 2   metFORMIN (GLUCOPHAGE-XR) 500 MG 24 hr tablet, Take 2 tablets (1,000 mg total) by mouth 2 (two) times daily., Disp: 360 tablet, Rfl: 1   Misc. Devices MISC, Provide patient with shower chair and grab bar. ICD10 M17.10 M54.16, Disp: 1 each, Rfl: 0   multivitamin-iron-minerals-folic acid (CENTRUM) chewable tablet, Chew 1 tablet by mouth daily., Disp: 90 tablet, Rfl: 3   naloxegol oxalate (MOVANTIK) 25 MG TABS tablet, Take 1 tablet (25 mg total) by mouth  daily., Disp: 90 tablet, Rfl: 1   nystatin (NYSTATIN) powder, Apply 1 application topically 3 (three) times daily., Disp: 15 g, Rfl: 0   nystatin cream (MYCOSTATIN), Apply 1 Application topically 2 (two) times daily., Disp: 60 g, Rfl: 3   ondansetron (ZOFRAN) 4 MG tablet, TAKE 1 TABLET BY MOUTH EVERY 8 HOURS AS NEEDED FOR NAUSEA AND VOMITING, Disp: 30 tablet, Rfl: 0   predniSONE (DELTASONE) 20 MG tablet, Take 2 tablets (40 mg total) by mouth daily., Disp: 10 tablet, Rfl: 0   PREMARIN vaginal cream, PLACE 1 APPLICATORFUL VAGINALLY DAILY. (Patient taking differently: 1 applicator 2 (two) times a  week.), Disp: 30 g, Rfl: 1   promethazine-dextromethorphan (PROMETHAZINE-DM) 6.25-15 MG/5ML syrup, Take 5 mLs by mouth 4 (four) times daily as needed for cough., Disp: 240 mL, Rfl: 0   rosuvastatin (CRESTOR) 20 MG tablet, Take 1 tablet (20 mg total) by mouth daily., Disp: 90 tablet, Rfl: 3   Tiotropium Bromide Monohydrate (SPIRIVA RESPIMAT) 2.5 MCG/ACT AERS, Inhale 2 puffs into the lungs daily., Disp: 4 g, Rfl: 0   triamcinolone (KENALOG) 0.025 % ointment, Apply 1 Application topically 2 (two) times daily., Disp: 30 g, Rfl: 0   zolpidem (AMBIEN) 5 MG tablet, TAKE 1 TABLET BY MOUTH EVERYDAY AT BEDTIME, Disp: 30 tablet, Rfl: 1   Allergies  Allergen Reactions   Amoxicillin Other (See Comments)   Codeine Sulfate Other (See Comments)    Confusion, anxiety, fatigue   Meperidine And Related Nausea And Vomiting   Minocycline Hcl Nausea And Vomiting    Hospitalized for vomiting along with allergic reaction   Penicillins Other (See Comments)    Has patient had a PCN reaction causing immediate rash, facial/tongue/throat swelling, SOB or lightheadedness with hypotension: Yes Has patient had a PCN reaction causing severe rash involving mucus membranes or skin necrosis: Yes Has patient had a PCN reaction that required hospitalization Yes Has patient had a PCN reaction occurring within the last 10 years: Yes If all of the above answers are "NO", then may proceed with Cephalosporin use.    Propoxyphene N-Acetaminophen Other (See Comments)    confused   Tetracycline Hcl Other (See Comments)    Vomiting Note: Patient took a course of doxycycline in June 2014 without any problems.    Past Medical History:  Diagnosis Date   Asthma    Back pain    Chronic interstitial cystitis    Followed by Dr. Marcine Matar   COPD (chronic obstructive pulmonary disease) (HCC)    DDD (degenerative disc disease), lumbar    Degeneration of cervical intervertebral disc    Depression    Diabetes mellitus, type 2  (HCC) 2007   Heme positive stool    EGD on 04/08/2010 by Dr. Ewing Schlein showed chronic gastritis and a few gastric polyps; pathology showed a fundic gland polyp.  Colonoscopy on 04/08/2010 showed small external and internal hemorrhoids, and a few benign-appearing diminutive polyps in the rectum, the distal sigmoid colon, and in the distal descending colon; pathology showed hyperplastic polyps.    Hypertension    Hypothyroidism    Iron deficiency anemia    Spinal stenosis      Past Surgical History:  Procedure Laterality Date   CARPAL TUNNEL RELEASE  05/08/2000   By Dr. Katy Fitch. Sypher, Montez Hageman.    Family History  Problem Relation Age of Onset   Heart attack Father 68   Diabetes Mother    Hypertension Mother    Diabetes Sister    Breast cancer  Neg Hx    Colon cancer Neg Hx     Social History   Tobacco Use   Smoking status: Some Days    Packs/day: 0.50    Years: 45.00    Additional pack years: 0.00    Total pack years: 22.50    Types: Cigarettes   Smokeless tobacco: Never   Tobacco comments:    1/2 pack per day  Vaping Use   Vaping Use: Never used  Substance Use Topics   Alcohol use: No   Drug use: No    ROS   Objective:   Vitals: BP 115/71 (BP Location: Right Arm)   Pulse 98   Temp 98.1 F (36.7 C) (Oral)   Resp 20   LMP 01/17/2009   SpO2 93%   Physical Exam Constitutional:      General: She is not in acute distress.    Appearance: Normal appearance. She is well-developed and normal weight. She is not ill-appearing, toxic-appearing or diaphoretic.  HENT:     Head: Normocephalic and atraumatic.     Right Ear: Tympanic membrane, ear canal and external ear normal. No drainage or tenderness. No middle ear effusion. There is no impacted cerumen. Tympanic membrane is not erythematous or bulging.     Left Ear: Tympanic membrane, ear canal and external ear normal. No drainage or tenderness.  No middle ear effusion. There is no impacted cerumen. Tympanic membrane is not  erythematous or bulging.     Nose: Nose normal. No congestion or rhinorrhea.     Mouth/Throat:     Mouth: Mucous membranes are moist. No oral lesions.     Pharynx: No pharyngeal swelling, oropharyngeal exudate, posterior oropharyngeal erythema or uvula swelling.     Tonsils: No tonsillar exudate or tonsillar abscesses.  Eyes:     General: No scleral icterus.       Right eye: No discharge.        Left eye: No discharge.     Extraocular Movements: Extraocular movements intact.     Right eye: Normal extraocular motion.     Left eye: Normal extraocular motion.     Conjunctiva/sclera: Conjunctivae normal.  Cardiovascular:     Rate and Rhythm: Normal rate and regular rhythm.     Heart sounds: Normal heart sounds. No murmur heard.    No friction rub. No gallop.  Pulmonary:     Effort: Pulmonary effort is normal. No respiratory distress.     Breath sounds: No stridor. No wheezing, rhonchi or rales.  Chest:     Chest wall: No tenderness.  Musculoskeletal:     Cervical back: Normal range of motion and neck supple.  Lymphadenopathy:     Cervical: No cervical adenopathy.  Skin:    General: Skin is warm and dry.  Neurological:     General: No focal deficit present.     Mental Status: She is alert and oriented to person, place, and time.  Psychiatric:        Mood and Affect: Mood normal.        Behavior: Behavior normal.    DG Chest 2 View  Result Date: 02/28/2023 CLINICAL DATA:  Shortness of breath and cough EXAM: CHEST - 2 VIEW COMPARISON:  Chest x-ray 01/31/2023 FINDINGS: The heart size and mediastinal contours are within normal limits. Both lungs are clear. The visualized skeletal structures are unremarkable. IMPRESSION: No active cardiopulmonary disease. Electronically Signed   By: Darliss Cheney M.D.   On: 02/28/2023 17:41   DG Chest  2 View  Result Date: 01/31/2023 CLINICAL DATA:  Shortness of breath, lower extremity edema. EXAM: CHEST - 2 VIEW COMPARISON:  01/29/2023. FINDINGS:  Clear lungs. Normal heart size and mediastinal contours. No pleural effusion or pneumothorax. Visualized bones and upper abdomen are unremarkable. IMPRESSION: No evidence of acute cardiopulmonary disease. Electronically Signed   By: Orvan Falconer M.D.   On: 01/31/2023 19:42   DG Chest 1 View  Result Date: 01/29/2023 CLINICAL DATA:  Fluid on legs for several days, shortness of breath, leg pain, diabetes mellitus, hypertension, smoker, COPD EXAM: CHEST  1 VIEW COMPARISON:  Portable exam 1225 hours compared to 12/30/2022 FINDINGS: Normal heart size, mediastinal contours, and pulmonary vascularity. Atherosclerotic calcification aorta. Lungs clear. No infiltrate, pleural effusion, or pneumothorax. IMPRESSION: No acute abnormalities. Aortic Atherosclerosis (ICD10-I70.0). Electronically Signed   By: Ulyses Southward M.D.   On: 01/29/2023 12:46   DG Chest 2 View  Result Date: 12/30/2022 CLINICAL DATA:  Cough for 2 weeks.  Short of breath. EXAM: CHEST - 2 VIEW COMPARISON:  01/20/2022. FINDINGS: Cardiac silhouette is normal in size and configuration. Normal mediastinal and hilar contours. Clear lungs.  No pleural effusion or pneumothorax. Skeletal structures are intact. IMPRESSION: No active cardiopulmonary disease. Electronically Signed   By: Amie Portland M.D.   On: 12/30/2022 12:51    Results for orders placed or performed during the hospital encounter of 03/23/23 (from the past 24 hour(s))  POC CBG monitoring     Status: Abnormal   Collection Time: 03/23/23 10:40 AM  Result Value Ref Range   POCT Glucose ([facility]) 246 (A) 70 - 99 mg/dL   *Note: Due to a large number of results and/or encounters for the requested time period, some results have not been displayed. A complete set of results can be found in Results Review.    Assessment and Plan :   PDMP not reviewed this encounter.  1. Chronic obstructive pulmonary disease, unspecified COPD type (HCC)   2. Moderate persistent asthma without complication     Advised against further steroid use given her hyperglycemia, poorly controlled diabetes.  As she has more productive sputum in the context of her COPD, fits criteria to use azithromycin.  Recommended using a duo nebulizer treatment regularly, scheduled every 4 hours at home.  I refilled her albuterol inhaler.  Follow-up with her PCP as soon as possible.  Counseled patient on potential for adverse effects with medications prescribed/recommended today, ER and return-to-clinic precautions discussed, patient verbalized understanding.    Wallis Bamberg, PA-C 03/23/23 1134

## 2023-03-26 ENCOUNTER — Telehealth: Payer: Self-pay | Admitting: Licensed Clinical Social Worker

## 2023-03-26 NOTE — Patient Outreach (Signed)
  Care Coordination   03/26/2023 Name: Mary Gray MRN: [id number] DOB: [birth year]   Care Coordination Outreach Attempts:  An unsuccessful telephone outreach was attempted for a scheduled appointment today.  Follow Up Plan:  Additional outreach attempts will be made to offer the patient care coordination information and services.   Encounter Outcome:  No Answer   Care Coordination Interventions:  No, not indicated    Jenel Lucks, MSW, LCSW [facility] [place]  [email or web address] Phone [phone] 3:44 PM

## 2023-03-27 ENCOUNTER — Other Ambulatory Visit: Payer: Self-pay | Admitting: Nurse Practitioner

## 2023-03-27 DIAGNOSIS — M51369 Other intervertebral disc degeneration, lumbar region without mention of lumbar back pain or lower extremity pain: Secondary | ICD-10-CM

## 2023-03-27 DIAGNOSIS — M47816 Spondylosis without myelopathy or radiculopathy, lumbar region: Secondary | ICD-10-CM

## 2023-03-27 DIAGNOSIS — G894 Chronic pain syndrome: Secondary | ICD-10-CM

## 2023-03-27 DIAGNOSIS — M5136 Other intervertebral disc degeneration, lumbar region: Secondary | ICD-10-CM

## 2023-03-27 DIAGNOSIS — G2581 Restless legs syndrome: Secondary | ICD-10-CM

## 2023-03-27 NOTE — Telephone Encounter (Signed)
Requested medication (s) are due for refill today: Yes  Requested medication (s) are on the active medication list: Yes  Last refill:  03/02/23  Future visit scheduled:   Notes to clinic:  See request.    Requested Prescriptions  Pending Prescriptions Disp Refills   cyclobenzaprine (FLEXERIL) 5 MG tablet 30 tablet 0    Sig: Take 1 tablet (5 mg total) by mouth at bedtime.     Not Delegated - Analgesics:  Muscle Relaxants Failed - 03/27/2023  8:34 AM      Failed - This refill cannot be delegated      Passed - Valid encounter within last 6 months    Recent Outpatient Visits           2 months ago Chronic venous insufficiency   [place] [facility] [place], Shea Stakes, NP   2 months ago Primary hypertension   [place] [facility] [place], Shea Stakes, NP   5 months ago Chronic constipation   [facility] [place], Marylene Land M, [place]   10 months ago Bronchitis   [facility] [place], Shea Stakes, NP   11 months ago Encounter to establish care   [facility] Claiborne Rigg, NP       Future Appointments             In 1 week Claiborne Rigg, NP [facility]   In 3 weeks Nahser, Deloris Ping, MD [facility]   In 1 month Claiborne Rigg, NP [facility]

## 2023-03-27 NOTE — Telephone Encounter (Signed)
Copied from CRM [id number]. Topic: General - Other >> Mar 27, 2023  8:15 AM Everette C wrote: Reason for CRM: Medication Refill - Medication: cyclobenzaprine (FLEXERIL) 5 MG tablet [[id number]]  Has the patient contacted their pharmacy? Yes.   (Agent: If no, request that the patient contact the pharmacy for the refill. If patient does not wish to contact the pharmacy document the reason why and proceed with request.) (Agent: If yes, when and what did the pharmacy advise?)  Preferred Pharmacy (with phone number or street name): [id number] [place], [place] - [address] [place] [address] Phone: [phone] Fax: [phone] Hours: Not open 24 hours   Has the patient been seen for an appointment in the last year OR does the patient have an upcoming appointment? Yes.    Agent: Please be advised that RX refills may take up to 3 business days. We ask that you follow-up with your pharmacy.

## 2023-03-29 ENCOUNTER — Ambulatory Visit (HOSPITAL_COMMUNITY)
Admission: RE | Admit: 2023-03-29 | Discharge: 2023-03-29 | Disposition: A | Discharge status: Home / Self Care | Payer: Medicare HMO | Source: Ambulatory Visit | Attending: Emergency Medicine | Admitting: Emergency Medicine

## 2023-03-29 ENCOUNTER — Encounter (HOSPITAL_COMMUNITY): Payer: Self-pay

## 2023-03-29 ENCOUNTER — Other Ambulatory Visit: Payer: Self-pay | Admitting: Physician Assistant

## 2023-03-29 ENCOUNTER — Other Ambulatory Visit: Payer: Self-pay | Admitting: Family Medicine

## 2023-03-29 VITALS — BP 125/75 | HR 91 | Temp 98.3°F | Resp 18

## 2023-03-29 DIAGNOSIS — I10 Essential (primary) hypertension: Secondary | ICD-10-CM

## 2023-03-29 DIAGNOSIS — K219 Gastro-esophageal reflux disease without esophagitis: Secondary | ICD-10-CM

## 2023-03-29 DIAGNOSIS — J449 Chronic obstructive pulmonary disease, unspecified: Secondary | ICD-10-CM | POA: Diagnosis not present

## 2023-03-29 MED ORDER — PREDNISONE 10 MG PO TABS: 20.0000 mg | ORAL_TABLET | Freq: Every day | ORAL | 0 refills | Status: DC

## 2023-03-29 MED ORDER — PREDNISONE 20 MG PO TABS: 20.0000 mg | ORAL_TABLET | Freq: Every day | ORAL | 0 refills | Status: AC

## 2023-03-29 NOTE — Telephone Encounter (Signed)
Labs in date  Requested Prescriptions  Pending Prescriptions Disp Refills   esomeprazole (NEXIUM) 40 MG capsule [Pharmacy Med Name: ESOMEPRAZOLE MAG DR 40 MG CAP] 90 capsule 1    Sig: TAKE 1 CAPSULE (40 MG TOTAL) BY MOUTH DAILY.     Gastroenterology: Proton Pump Inhibitors 2 Failed - 03/29/2023  2:39 AM      Failed - AST in normal range and within 360 days    AST  Date Value Ref Range Status  02/28/2023 44 (H) 15 - 41 U/L Final         Passed - ALT in normal range and within 360 days    ALT  Date Value Ref Range Status  02/28/2023 23 0 - 44 U/L Final         Passed - Valid encounter within last 12 months    Recent Outpatient Visits           2 months ago Chronic venous insufficiency   [place] [facility] [place], Shea Stakes, NP   2 months ago Primary hypertension   [place] [facility] [place], Shea Stakes, NP   5 months ago Chronic constipation   [facility] [place], Marylene Land M, [place]   10 months ago Bronchitis   [facility] [place], Shea Stakes, NP   11 months ago Encounter to establish care   [facility] Claiborne Rigg, NP       Future Appointments             In 1 week Claiborne Rigg, NP [facility]   In 2 weeks Nahser, Deloris Ping, MD [facility]   In 1 month Claiborne Rigg, NP [facility]

## 2023-03-29 NOTE — Discharge Instructions (Addendum)
Continue using nebulizer machine at home Continue the cough syrup, up to 4x daily as needed The daily allergy medicine should be continued daily as well  Starting tomorrow, take the prednisone once daily for 5 days Monitor your blood sugars while taking this medication  Please follow closely with your primary care provider  If at any point your symptoms fail to improve or they worsen, please go directly to the emergency department

## 2023-03-29 NOTE — ED Triage Notes (Signed)
Pt reports seen here last Friday for asthma/copd and given medications. Reports still feels bad esp when going outside. Having cough and wheezing and using nebulizer.   Pt reports that didn't receive prednisone like has in past that helped.

## 2023-03-29 NOTE — ED Provider Notes (Signed)
[facility]    CSN: [id number] Arrival date & time: 03/29/23  1628     History   Chief Complaint Chief Complaint  Patient presents with   Cough    HPI Mary Gray is a 62 y.o. female.  Presents with continued cough and wheezing Seen on 5/3 and prescribed azithromycin, promethazine, Duoneb. She finished all of the abx and has been trying neb machine twice daily. Feels she is still wheezing. She usually has resolution of symptoms with prednisone, which was not prescribed at last visit due to her uncontrolled diabetes and recent steroids in march. She feels the prednisone would help her this time Has primary care visit next week  No fever. No worsening of symptoms. No shortness of breath at this time. She is current smoker  Past Medical History:  Diagnosis Date   Asthma    Back pain    Chronic interstitial cystitis    Followed by Dr. Marcine Matar   COPD (chronic obstructive pulmonary disease) (HCC)    DDD (degenerative disc disease), lumbar    Degeneration of cervical intervertebral disc    Depression    Diabetes mellitus, type 2 (HCC) 2007   Heme positive stool    EGD on 04/08/2010 by Dr. Ewing Schlein showed chronic gastritis and a few gastric polyps; pathology showed a fundic gland polyp.  Colonoscopy on 04/08/2010 showed small external and internal hemorrhoids, and a few benign-appearing diminutive polyps in the rectum, the distal sigmoid colon, and in the distal descending colon; pathology showed hyperplastic polyps.    Hypertension    Hypothyroidism    Iron deficiency anemia    Spinal stenosis     Patient Active Problem List   Diagnosis Date Noted   Acute respiratory failure with hypoxia (HCC)    COVID-19 virus infection 01/20/2022   Lumbar spondylosis 03/22/2021   Chronic radicular lumbar pain 03/22/2021   Bilateral primary osteoarthritis of knee 03/22/2021   Chronic pain syndrome 03/22/2021   Tobacco use disorder 03/22/2021   Hyponatremia 03/04/2021    Chronic bilateral low back pain without sciatica 12/13/2020   Sleep disturbance 12/13/2020   Vaginitis 10/27/2020   Right ear pain 10/13/2020   Nausea 02/10/2020   B12 deficiency 11/25/2018   Urinary, incontinence, stress female 09/09/2018   Primary osteoarthritis of right knee 05/01/2018   Chronic venous insufficiency 02/26/2017   Candidal skin infection 03/03/2014   Preventative health care 05/14/2013   Spinal stenosis, lumbar region, with neurogenic claudication 03/01/2011   Hypothyroidism 07/06/2006   Diabetes mellitus type 2, uncontrolled 07/06/2006   Hyperlipemia 07/06/2006   Generalized anxiety disorder 07/06/2006   Essential hypertension 07/06/2006   GERD 07/06/2006   Lumbar degenerative disc disease 07/06/2006    Past Surgical History:  Procedure Laterality Date   CARPAL TUNNEL RELEASE  05/08/2000   By Dr. Katy Fitch. Sypher, Montez Hageman.    OB History   No obstetric history on file.      Home Medications    Prior to Admission medications   Medication Sig Start Date End Date Taking? Authorizing Provider  Accu-Chek Softclix Lancets lancets Check blood sugar 1 time per day 07/12/20   Tyson Alias, MD  acetaminophen (TYLENOL) 500 MG tablet Take 1,000 mg by mouth every 6 (six) hours as needed for mild pain.    [provider]  albuterol (VENTOLIN HFA) 108 (90 Base) MCG/ACT inhaler Inhale 1-2 puffs into the lungs every 4 (four) hours as needed for wheezing or shortness of breath. 03/23/23  Wallis Bamberg, PA-C  amitriptyline (ELAVIL) 25 MG tablet Take 1 tablet (25 mg total) by mouth at bedtime. 10/23/22   Claiborne Rigg, NP  amLODipine (NORVASC) 5 MG tablet Take 1 tablet (5 mg total) by mouth daily. 10/04/22 04/02/23  Anders Simmonds, PA-C  aspirin EC 81 MG tablet Take 81 mg by mouth daily. Swallow whole.    [provider]  azithromycin (ZITHROMAX) 250 MG tablet Day 1: take 2 tablets. Day 2-5: Take 1 tablet daily. 03/23/23   Wallis Bamberg, PA-C  [facility]  VITAMIN B12 1000 MCG tablet TAKE 1 TABLET BY MOUTH EVERY DAY Patient taking differently: Take 1,000 mcg by mouth daily. 01/12/21   Tyson Alias, MD  cyclobenzaprine (FLEXERIL) 5 MG tablet Take 1 tablet (5 mg total) by mouth at bedtime. 03/02/23   Claiborne Rigg, NP  dextromethorphan-guaiFENesin (TUSSIN DM) 10-100 MG/5ML liquid Take 10 mLs by mouth every 6 (six) hours as needed for cough. 02/19/23   Claiborne Rigg, NP  enalapril (VASOTEC) 20 MG tablet Take 1 tablet (20 mg total) by mouth daily. 10/04/22   Anders Simmonds, PA-C  esomeprazole (NEXIUM) 40 MG capsule TAKE 1 CAPSULE (40 MG TOTAL) BY MOUTH DAILY. 03/29/23 09/25/23  Claiborne Rigg, NP  fluticasone-salmeterol [facility] INHUB) 250-50 MCG/ACT AEPB Inhale 1 puff into the lungs in the morning and at bedtime. 02/20/23   Claiborne Rigg, NP  gabapentin (NEURONTIN) 600 MG tablet Take 1 tablet (600 mg total) by mouth 3 (three) times daily. 01/30/23   Claiborne Rigg, NP  glucose blood (ACCU-CHEK GUIDE) test strip CHECK BLOOD SUGAR 1 TIME PER DAY 05/12/21   Tyson Alias, MD  ibuprofen (ADVIL) 200 MG tablet Take 400 mg by mouth every 6 (six) hours as needed for fever, headache or mild pain.    [provider]  ipratropium-albuterol (DUONEB) 0.5-2.5 (3) MG/3ML SOLN Take 3 mLs by nebulization every 4 (four) hours as needed. 03/23/23   Wallis Bamberg, PA-C  levocetirizine (XYZAL) 5 MG tablet Take 1 tablet (5 mg total) by mouth every evening. 03/23/23   Wallis Bamberg, PA-C  levothyroxine (SYNTHROID) 88 MCG tablet Take 1 tablet (88 mcg total) by mouth daily before breakfast. 01/03/23   Claiborne Rigg, NP  metFORMIN (GLUCOPHAGE-XR) 500 MG 24 hr tablet Take 2 tablets (1,000 mg total) by mouth 2 (two) times daily. 10/04/22   Anders Simmonds, PA-C  Misc. Devices MISC Provide patient with shower chair and grab bar. ICD10 M17.10 M54.16 03/03/23   Claiborne Rigg, NP  multivitamin-iron-minerals-folic acid (CENTRUM) chewable tablet Chew 1 tablet  by mouth daily. 07/12/20   Tyson Alias, MD  naloxegol oxalate (MOVANTIK) 25 MG TABS tablet Take 1 tablet (25 mg total) by mouth daily. 01/03/23   Claiborne Rigg, NP  nystatin (NYSTATIN) powder Apply 1 application topically 3 (three) times daily. 03/10/21   [name], MD  nystatin cream (MYCOSTATIN) Apply 1 Application topically 2 (two) times daily. 01/03/23   Claiborne Rigg, NP  ondansetron (ZOFRAN) 4 MG tablet TAKE 1 TABLET BY MOUTH EVERY 8 HOURS AS NEEDED FOR NAUSEA AND VOMITING 03/15/23   Hoy Register, MD  predniSONE (DELTASONE) 20 MG tablet Take 1 tablet (20 mg total) by mouth daily with breakfast for 5 days. 03/29/23 04/03/23  Kathlyn Leachman, Lurena Joiner, PA-C  PREMARIN vaginal cream PLACE 1 APPLICATORFUL VAGINALLY DAILY. Patient taking differently: 1 applicator 2 (two) times a week. 11/23/21   Tyson Alias, MD  promethazine-dextromethorphan (PROMETHAZINE-DM) 6.25-15 MG/5ML  syrup Take 5 mLs by mouth 3 (three) times daily as needed for cough. 03/23/23   Wallis Bamberg, PA-C  rosuvastatin (CRESTOR) 20 MG tablet Take 1 tablet (20 mg total) by mouth daily. 10/04/22   Anders Simmonds, PA-C  Tiotropium Bromide Monohydrate (SPIRIVA RESPIMAT) 2.5 MCG/ACT AERS Inhale 2 puffs into the lungs daily. 12/30/22   Allwardt, Crist Infante, PA-C  triamcinolone (KENALOG) 0.025 % ointment Apply 1 Application topically 2 (two) times daily. 01/22/23   Claiborne Rigg, NP  zolpidem (AMBIEN) 5 MG tablet TAKE 1 TABLET BY MOUTH EVERYDAY AT BEDTIME 03/21/23   Claiborne Rigg, NP    Family History Family History  Problem Relation Age of Onset   Heart attack Father 68   Diabetes Mother    Hypertension Mother    Diabetes Sister    Breast cancer Neg Hx    Colon cancer Neg Hx     Social History Social History   Tobacco Use   Smoking status: Some Days    Packs/day: 0.50    Years: 45.00    Additional pack years: 0.00    Total pack years: 22.50    Types: Cigarettes   Smokeless tobacco: Never   Tobacco comments:     1/2 pack per day  Vaping Use   Vaping Use: Never used  Substance Use Topics   Alcohol use: No   Drug use: No     Allergies   Amoxicillin, Codeine sulfate, Meperidine and related, Minocycline hcl, Penicillins, Propoxyphene n-acetaminophen, and Tetracycline hcl   Review of Systems Review of Systems As per HPI  Physical Exam Triage Vital Signs ED Triage Vitals  Enc Vitals Group     BP 03/29/23 1642 125/75     Pulse Rate 03/29/23 1642 91     Resp 03/29/23 1642 18     Temp 03/29/23 1642 98.3 F (36.8 C)     Temp Source 03/29/23 1642 Oral     SpO2 03/29/23 1642 95 %     Weight --      Height --      Head Circumference --      Peak Flow --      Pain Score 03/29/23 1640 8     Pain Loc --      Pain Edu? --      Excl. in GC? --    No data found.  Updated Vital Signs BP 125/75 (BP Location: Right Arm)   Pulse 91   Temp 98.3 F (36.8 C) (Oral)   Resp 18   LMP 01/17/2009   SpO2 95%    Physical Exam Vitals and nursing note reviewed.  Constitutional:      General: She is not in acute distress. HENT:     Nose: No congestion or rhinorrhea.     Mouth/Throat:     Mouth: Mucous membranes are moist.     Pharynx: Oropharynx is clear. No posterior oropharyngeal erythema.  Eyes:     Conjunctiva/sclera: Conjunctivae normal.  Cardiovascular:     Rate and Rhythm: Normal rate and regular rhythm.     Pulses: Normal pulses.     Heart sounds: Normal heart sounds.  Pulmonary:     Effort: Pulmonary effort is normal. No respiratory distress.     Breath sounds: Normal breath sounds. No wheezing, rhonchi or rales.     Comments: Clear throughout with wheezing or rales. No respiratory distress. Normal work of breathing Musculoskeletal:     Cervical back: Normal range of motion.  Lymphadenopathy:     Cervical: No cervical adenopathy.  Skin:    General: Skin is warm and dry.  Neurological:     Mental Status: She is alert and oriented to person, place, and time.     UC  Treatments / Results  Labs (all labs ordered are listed, but only abnormal results are displayed) Labs Reviewed - No data to display  EKG  Radiology No results found.  Procedures Procedures (including critical care time)  Medications Ordered in UC Medications - No data to display  Initial Impression / Assessment and Plan / UC Course  I have reviewed the triage vital signs and the nursing notes.  Pertinent labs & imaging results that were available during my care of the patient were reviewed by me and considered in my medical decision making (see chart for details).  Afebrile, well appearing, sating 95% on room air. Lungs are surprisingly clear given her history. No indication to repeat imaging at this time - negative xray 6 days ago. On chart review her A1c in November was 6.5.  Over the last few months her glucose has been documented as in the upper 200s.  She does not check her blood sugars at home but does have a glucometer available. She feels she will benefit from steroid at this time.  Will try low-dose 20 mg prednisone daily for 5 days.  Advised to monitor blood sugars while taking this medication. She will continue nebulizer and promethazine as well. Strict ED precautions discussed for any persisting or worsening symptoms, patient verbalizes understanding  Final Clinical Impressions(s) / UC Diagnoses   Final diagnoses:  Chronic obstructive pulmonary disease, unspecified COPD type (HCC)     Discharge Instructions      Continue using nebulizer machine at home Continue the cough syrup, up to 4x daily as needed The daily allergy medicine should be continued daily as well  Starting tomorrow, take the prednisone once daily for 5 days Monitor your blood sugars while taking this medication  Please follow closely with your primary care provider  If at any point your symptoms fail to improve or they worsen, please go directly to the emergency department     ED  Prescriptions     Medication Sig Dispense Auth. Provider   predniSONE (DELTASONE) 10 MG tablet  (Status: Discontinued) Take 2 tablets (20 mg total) by mouth daily with breakfast for 5 days. 10 tablet Niya Behler, PA-C   predniSONE (DELTASONE) 20 MG tablet Take 1 tablet (20 mg total) by mouth daily with breakfast for 5 days. 5 tablet Laneka Mcgrory, Lurena Joiner, PA-C      PDMP not reviewed this encounter.   Mccall Will, Ray Church 03/29/23 6962

## 2023-03-29 NOTE — Telephone Encounter (Signed)
Lasix 40 mg refused because dose was reduced to 20 mg.

## 2023-03-30 ENCOUNTER — Telehealth: Payer: Self-pay | Admitting: Nurse Practitioner

## 2023-03-30 DIAGNOSIS — I10 Essential (primary) hypertension: Secondary | ICD-10-CM

## 2023-03-30 MED ORDER — ENALAPRIL MALEATE 20 MG PO TABS
20.0000 mg | ORAL_TABLET | Freq: Every day | ORAL | 0 refills | Status: DC
Start: 2023-03-30 — End: 2023-07-04

## 2023-03-30 NOTE — Telephone Encounter (Signed)
Medication Refill - Medication:   Disp Refills Start End   enalapril (VASOTEC) 20 MG tablet         Has the patient contacted their pharmacy? Yes.     Preferred Pharmacy (with phone number or street name):  [id number] [place], [place] - [address] Phone: [phone]  Fax: [phone]      Has the patient been seen for an appointment in the last year OR does the patient have an upcoming appointment? Yes.     Please assist patient further as she says she is now completely out of her medicine and needs as soon as possible

## 2023-04-03 ENCOUNTER — Encounter: Payer: Medicare HMO | Admitting: Vascular Surgery

## 2023-04-03 ENCOUNTER — Ambulatory Visit (HOSPITAL_COMMUNITY): Payer: Medicare HMO

## 2023-04-06 ENCOUNTER — Other Ambulatory Visit: Payer: Self-pay | Admitting: Nurse Practitioner

## 2023-04-06 ENCOUNTER — Ambulatory Visit: Payer: Medicare HMO | Admitting: Nurse Practitioner

## 2023-04-06 DIAGNOSIS — R110 Nausea: Secondary | ICD-10-CM

## 2023-04-06 MED ORDER — ONDANSETRON HCL 4 MG PO TABS
ORAL_TABLET | ORAL | 0 refills | Status: DC
Start: 2023-04-06 — End: 2023-05-02

## 2023-04-06 NOTE — Telephone Encounter (Signed)
Requested medication (s) are due for refill today: routing for approval  Requested medication (s) are on the active medication list: yes  Last refill:  03/14/22  Future visit scheduled: yes  Notes to clinic:  Unable to refill per protocol, cannot delegate.      Requested Prescriptions  Pending Prescriptions Disp Refills   ondansetron (ZOFRAN) 4 MG tablet 30 tablet 0     Not Delegated - Gastroenterology: Antiemetics - ondansetron Failed - 04/06/2023 12:30 PM      Failed - This refill cannot be delegated      Failed - AST in normal range and within 360 days    AST  Date Value Ref Range Status  02/28/2023 44 (H) 15 - 41 U/L Final         Passed - ALT in normal range and within 360 days    ALT  Date Value Ref Range Status  02/28/2023 23 0 - 44 U/L Final         Passed - Valid encounter within last 6 months    Recent Outpatient Visits           2 months ago Chronic venous insufficiency   [place] [facility] [place], Shea Stakes, NP   3 months ago Primary hypertension   [facility] [place], Shea Stakes, NP   6 months ago Chronic constipation   [facility] [place], Marylene [place], [place]   10 months ago Bronchitis   [facility] [place], Shea Stakes, NP   11 months ago Encounter to establish care   [facility] [place], Shea Stakes, NP       Future Appointments             In 1 week Nahser, Deloris Ping, MD [facility]   In 3 weeks Claiborne Rigg, NP [facility]   In 2 months Claiborne Rigg, NP [facility]

## 2023-04-06 NOTE — Telephone Encounter (Signed)
Medication Refill - Medication: ondansetron (ZOFRAN) 4 MG tablet  Has the patient contacted their pharmacy? yes (Agent: If no, request that the patient contact the pharmacy for the refill. If patient does not wish to contact the pharmacy document the reason why and proceed with request.) (Agent: If yes, when and what did the pharmacy advise?)contact pcp  Preferred Pharmacy (with phone number or street name): [id number] [place], [place] - [address] [place] [address] Phone: [phone]  Fax: [phone]  Has the patient been seen for an appointment in the last year OR does the patient have an upcoming appointment? yes  Agent: Please be advised that RX refills may take up to 3 business days. We ask that you follow-up with your pharmacy.

## 2023-04-10 ENCOUNTER — Other Ambulatory Visit: Payer: Self-pay | Admitting: Nurse Practitioner

## 2023-04-10 DIAGNOSIS — M47816 Spondylosis without myelopathy or radiculopathy, lumbar region: Secondary | ICD-10-CM

## 2023-04-10 DIAGNOSIS — M5136 Other intervertebral disc degeneration, lumbar region: Secondary | ICD-10-CM

## 2023-04-10 DIAGNOSIS — M51369 Other intervertebral disc degeneration, lumbar region without mention of lumbar back pain or lower extremity pain: Secondary | ICD-10-CM

## 2023-04-10 DIAGNOSIS — G2581 Restless legs syndrome: Secondary | ICD-10-CM

## 2023-04-10 DIAGNOSIS — G894 Chronic pain syndrome: Secondary | ICD-10-CM

## 2023-04-10 NOTE — Telephone Encounter (Signed)
Medication Refill - Medication: cyclobenzaprine (FLEXERIL) 5 MG tablet   Has the patient contacted their pharmacy? No. No, more refills.   (Agent: If no, request that the patient contact the pharmacy for the refill. If patient does not wish to contact the pharmacy document the reason why and proceed with request.)   Preferred Pharmacy (with phone number or street name):  [id number] [place], [place] - [address]  [address] [place] [address]  Phone: [phone] Fax: [phone]  Hours: Not open 24 hours   Has the patient been seen for an appointment in the last year OR does the patient have an upcoming appointment? Yes.    Agent: Please be advised that RX refills may take up to 3 business days. We ask that you follow-up with your pharmacy.

## 2023-04-11 MED ORDER — CYCLOBENZAPRINE HCL 5 MG PO TABS
5.0000 mg | ORAL_TABLET | Freq: Every day | ORAL | 0 refills | Status: DC
Start: 2023-04-11 — End: 2023-05-22

## 2023-04-11 NOTE — Telephone Encounter (Signed)
Requested medication (s) are due for refill today: es  Requested medication (s) are on the active medication list: yes    Last refill: 02/19/23  #30  0 refills  Future visit scheduled yes 05/02/23  Notes to clinic:Not delegated, please review.  Requested Prescriptions  Pending Prescriptions Disp Refills   cyclobenzaprine (FLEXERIL) 5 MG tablet 30 tablet 0    Sig: Take 1 tablet (5 mg total) by mouth at bedtime.     Not Delegated - Analgesics:  Muscle Relaxants Failed - 04/10/2023  3:30 PM      Failed - This refill cannot be delegated      Passed - Valid encounter within last 6 months    Recent Outpatient Visits           2 months ago Chronic venous insufficiency   [facility] [place], Shea Stakes, NP   3 months ago Primary hypertension   [place] [place], Shea Stakes, NP   6 months ago Chronic constipation   [facility] [place], Marylene [place], [place]   11 months ago Bronchitis   [facility] [place], Shea Stakes, NP   11 months ago Encounter to establish care   [facility] Claiborne Rigg, NP       Future Appointments             In 1 week Nahser, Deloris Ping, MD [facility]   In 3 weeks Claiborne Rigg, NP [facility]   In 2 months Claiborne Rigg, NP [facility]

## 2023-04-13 ENCOUNTER — Telehealth: Payer: Self-pay | Admitting: *Deleted

## 2023-04-13 NOTE — Progress Notes (Signed)
  Care Coordination Note  04/13/2023 Name: [name] MRN: [id number] DOB: 01-Jan-1961  Mary Gray is a 62 y.o. year old female who is a primary care patient of Claiborne Rigg, NP and is actively engaged with the care management team. I reached out to [name] by phone today to assist with re-scheduling a follow up visit with the Licensed Clinical Social Worker  Follow up plan: Unsuccessful telephone outreach attempt made. A HIPAA compliant phone message was left for the patient providing contact information and requesting a return call.   Burman Nieves, CCMA Care Coordination Care Guide Direct Dial: [phone]

## 2023-04-15 ENCOUNTER — Other Ambulatory Visit: Payer: Self-pay | Admitting: Physician Assistant

## 2023-04-15 DIAGNOSIS — I10 Essential (primary) hypertension: Secondary | ICD-10-CM

## 2023-04-15 DIAGNOSIS — E1165 Type 2 diabetes mellitus with hyperglycemia: Secondary | ICD-10-CM

## 2023-04-16 NOTE — Progress Notes (Signed)
Cardiology Office Note:    Date:  04/16/2023   ID:  Mary Gray, DOB 1961-08-19, MRN [id number]  PCP:  Claiborne Rigg, NP   [facility] Providers Cardiologist:  Davionne Dowty      Referring MD: Claiborne Rigg, NP   Chief Complaint  Patient presents with   Leg Swelling   Hyperlipidemia  ***  History of Present Illness: Apr 18, 2023   Mary Gray is a 62 y.o. female with a hx of leg swelling, HLD     Past Medical History:  Diagnosis Date   Asthma    Back pain    Chronic interstitial cystitis    Followed by Dr. Marcine Matar   COPD (chronic obstructive pulmonary disease) (HCC)    DDD (degenerative disc disease), lumbar    Degeneration of cervical intervertebral disc    Depression    Diabetes mellitus, type 2 (HCC) 2007   Heme positive stool    EGD on 04/08/2010 by Dr. Ewing Schlein showed chronic gastritis and a few gastric polyps; pathology showed a fundic gland polyp.  Colonoscopy on 04/08/2010 showed small external and internal hemorrhoids, and a few benign-appearing diminutive polyps in the rectum, the distal sigmoid colon, and in the distal descending colon; pathology showed hyperplastic polyps.    Hypertension    Hypothyroidism    Iron deficiency anemia    Spinal stenosis     Past Surgical History:  Procedure Laterality Date   CARPAL TUNNEL RELEASE  05/08/2000   By Dr. Katy Fitch. Sypher, Montez Hageman.    Current Medications: No outpatient medications have been marked as taking for the 04/18/23 encounter (Office Visit) with Yasin Ducat, Deloris Ping, MD.     Allergies:   Amoxicillin, Codeine sulfate, Meperidine and related, Minocycline hcl, Penicillins, Propoxyphene n-acetaminophen, and Tetracycline hcl   Social History   Socioeconomic History   Marital status: Single    Spouse name: Not on file   Number of children: Not on file   Years of education: Not on file   Highest education level: Not on file  Occupational History   Not on file  Tobacco Use   Smoking  status: Some Days    Packs/day: 0.50    Years: 45.00    Additional pack years: 0.00    Total pack years: 22.50    Types: Cigarettes   Smokeless tobacco: Never   Tobacco comments:    1/2 pack per day  Vaping Use   Vaping Use: Never used  Substance and Sexual Activity   Alcohol use: No   Drug use: No   Sexual activity: Not Currently  Other Topics Concern   Not on file  Social History Narrative   Not on file   Social Determinants of Health   Financial Resource Strain: Medium Risk (01/16/2023)   Overall Financial Resource Strain (CARDIA)    Difficulty of Paying Living Expenses: Somewhat hard  Food Insecurity: Food Insecurity Present (01/16/2023)   Hunger Vital Sign    Worried About Running Out of Food in the Last Year: Often true    Ran Out of Food in the Last Year: Often true  Transportation Needs: No Transportation Needs (01/31/2023)   PRAPARE - Administrator, Civil Service (Medical): No    Lack of Transportation (Non-Medical): No  Recent Concern: Transportation Needs - Unmet Transportation Needs (01/16/2023)   PRAPARE - Administrator, Civil Service (Medical): Yes    Lack of Transportation (Non-Medical): Yes  Physical Activity: Sufficiently Active (  01/16/2023)   Exercise Vital Sign    Days of Exercise per Week: 5 days    Minutes of Exercise per Session: 30 min  Stress: Stress Concern Present (01/16/2023)   [employer] of Occupational Health - Occupational Stress Questionnaire    Feeling of Stress : To some extent  Social Connections: Moderately Isolated (01/16/2023)   Social Connection and Isolation Panel [NHANES]    Frequency of Communication with Friends and Family: More than three times a week    Frequency of Social Gatherings with Friends and Family: Once a week    Attends Religious Services: Never    Database administrator or Organizations: No    Attends Engineer, structural: Never    Marital Status: Living with partner      Family History: The patient's ***family history includes Diabetes in her mother and sister; Heart attack (age of onset: 14) in her father; Hypertension in her mother. There is no history of Breast cancer or Colon cancer.  ROS:   Please see the history of present illness.    *** All other systems reviewed and are negative.  EKGs/Labs/Other Studies Reviewed:    The following studies were reviewed today: ***  EKG:  EKG is *** ordered today.  The ekg ordered today demonstrates ***  Recent Labs: 01/03/2023: TSH 1.270 02/28/2023: ALT 23; BUN 7; Creatinine, Ser 0.77; Hemoglobin 10.3; Platelets 292; Potassium 4.3; Sodium 131  Recent Lipid Panel    Component Value Date/Time   CHOL 287 (H) 04/19/2022 1438   TRIG 600 (HH) 04/19/2022 1438   HDL 36 (L) 04/19/2022 1438   CHOLHDL 8.0 (H) 04/19/2022 1438   CHOLHDL 5.1 12/03/2014 1218   VLDL 48 (H) 12/03/2014 1218   LDLCALC 138 (H) 04/19/2022 1438   LDLDIRECT 101 (H) 07/07/2009 2256     Risk Assessment/Calculations:   {Does this patient have ATRIAL FIBRILLATION?[phone]}  No BP recorded.  {Refresh Note OR Click here to enter BP  :1}***         Physical Exam:    VS:  LMP 01/17/2009     Wt Readings from Last 3 Encounters:  01/31/23 180 lb 8.9 oz (81.9 kg)  01/03/23 180 lb 9.6 oz (81.9 kg)  12/23/22 180 lb (81.6 kg)     GEN: *** Well nourished, well developed in no acute distress HEENT: Normal NECK: No JVD; No carotid bruits LYMPHATICS: No lymphadenopathy CARDIAC: ***RRR, no murmurs, rubs, gallops RESPIRATORY:  Clear to auscultation without rales, wheezing or rhonchi  ABDOMEN: Soft, non-tender, non-distended MUSCULOSKELETAL:  No edema; No deformity  SKIN: Warm and dry NEUROLOGIC:  Alert and oriented x 3 PSYCHIATRIC:  Normal affect   ASSESSMENT:    No diagnosis found. PLAN:    In order of problems listed above:  ***      {Are you ordering a CV Procedure (e.g. stress test, cath, DCCV, TEE, etc)?   Press F2         :[id number]}    Medication Adjustments/Labs and Tests Ordered: Current medicines are reviewed at length with the patient today.  Concerns regarding medicines are outlined above.  No orders of the defined types were placed in this encounter.  No orders of the defined types were placed in this encounter.   There are no Patient Instructions on file for this visit.   Signed, Kristeen Miss, MD  04/16/2023 10:54 AM    [facility]

## 2023-04-17 NOTE — Telephone Encounter (Signed)
Requested Prescriptions  Pending Prescriptions Disp Refills   amLODipine (NORVASC) 5 MG tablet [Pharmacy Med Name: AMLODIPINE BESYLATE 5 MG TAB] 90 tablet 1    Sig: TAKE 1 TABLET (5 MG TOTAL) BY MOUTH DAILY.     Cardiovascular: Calcium Channel Blockers 2 Passed - 04/15/2023  1:31 AM      Passed - Last BP in normal range    BP Readings from Last 1 Encounters:  03/29/23 125/75         Passed - Last Heart Rate in normal range    Pulse Readings from Last 1 Encounters:  03/29/23 91         Passed - Valid encounter within last 6 months    Recent Outpatient Visits           2 months ago Chronic venous insufficiency   [facility], [place], NP   3 months ago Primary hypertension   [place] [place], [place], NP   6 months ago Chronic constipation   [facility] [place], Marylene Land M, [place]   11 months ago Bronchitis   [place] [facility] [place], [place] W, NP   12 months ago Encounter to establish care   [facility] [place], Shea Stakes, NP       Future Appointments             In 2 weeks Claiborne Rigg, NP [facility]   In 2 months Nahser, Deloris Ping, MD [facility]   In 2 months Claiborne Rigg, NP [facility]             metFORMIN (GLUCOPHAGE-XR) 500 MG 24 hr tablet [Pharmacy Med Name: METFORMIN HCL ER 500 MG TABLET] 360 tablet 1    Sig: TAKE 2 TABLETS BY MOUTH TWICE A DAY     Endocrinology:  Diabetes - Biguanides Failed - 04/15/2023  1:31 AM      Failed - HBA1C is between 0 and 7.9 and within 180 days    Hgb A1c MFr Bld  Date Value Ref Range Status  10/04/2022 6.5 (H) 4.8 - 5.6 % Final    Comment:             Prediabetes: 5.7 - 6.4          Diabetes: >6.4          Glycemic control for adults  with diabetes: <7.0          Passed - Cr in normal range and within 360 days    Creat  Date Value Ref Range Status  02/09/2015 0.55 0.50 - 1.10 mg/dL Final   Creatinine, Ser  Date Value Ref Range Status  02/28/2023 0.77 0.44 - 1.00 mg/dL Final   Creatinine,U  Date Value Ref Range Status  04/29/2010 71.9 mg/dL Final   Creatinine, Urine  Date Value Ref Range Status  03/22/2015 27.33 >20.0 mg/dL Final    Comment:    * (PPS) Presumptive positive screen result to be verified by         quantitative LC/MS or GC/MS confirmation testing.          Passed - eGFR in normal range and within 360 days    GFR, Est African American  Date Value Ref Range Status  02/09/2015 >89 mL/min  Final   GFR calc Af Amer  Date Value Ref Range Status  02/20/2020 >60 >60 mL/min Final   GFR, Est Non African American  Date Value Ref Range Status  02/09/2015 >89 mL/min Final    Comment:      The estimated GFR is a calculation valid for adults (>=80 years old) that uses the CKD-EPI algorithm to adjust for age and sex. It is   not to be used for children, pregnant women, hospitalized patients,    patients on dialysis, or with rapidly changing kidney function. According to the NKDEP, eGFR >89 is normal, 60-89 shows mild impairment, 30-59 shows moderate impairment, 15-29 shows severe impairment and <15 is ESRD.      GFR, Estimated  Date Value Ref Range Status  02/28/2023 >60 >60 mL/min Final    Comment:    (NOTE) Calculated using the CKD-EPI Creatinine Equation (2021)    eGFR  Date Value Ref Range Status  02/14/2023 94 >59 mL/min/1.73 Final         Passed - B12 Level in normal range and within 720 days    Vitamin B-12  Date Value Ref Range Status  04/19/2022 637 232 - 1,245 pg/mL Final         Passed - Valid encounter within last 6 months    Recent Outpatient Visits           2 months ago Chronic venous insufficiency   [facility] [place], Shea Stakes, NP   3 months ago Primary hypertension   [place] [facility] [place], Shea Stakes, NP   6 months ago Chronic constipation   [facility] [place], Marylene Land M, [place]   11 months ago Bronchitis   [place] [place], Shea Stakes, NP   12 months ago Encounter to establish care   [facility] Claiborne Rigg, NP       Future Appointments             In 2 weeks Claiborne Rigg, NP [facility]   In 2 months Nahser, Deloris Ping, MD [facility]   In 2 months Claiborne Rigg, NP [facility]            Passed - CBC within normal limits and completed in the last 12 months    WBC  Date Value Ref Range Status  02/28/2023 5.9 4.0 - 10.5 K/uL Final   RBC  Date Value Ref Range Status  02/28/2023 3.60 (L) 3.87 - 5.11 MIL/uL Final   Hemoglobin  Date Value Ref Range Status  02/28/2023 10.3 (L) 12.0 - 15.0 g/dL Final  62/13/0865 78.4 11.1 - 15.9 g/dL Final   HCT  Date Value Ref Range Status  02/28/2023 31.9 (L) 36.0 - 46.0 % Final   Hematocrit  Date Value Ref Range Status  04/19/2022 38.2 34.0 - 46.6 % Final   MCHC  Date Value Ref Range Status  02/28/2023 32.3 30.0 - 36.0 g/dL Final   [facility]  Date Value Ref Range Status  02/28/2023 28.6 26.0 - 34.0 pg Final   MCV  Date Value Ref Range Status  02/28/2023 88.6 80.0 - 100.0 fL Final  04/19/2022 87 79 - 97 fL Final   No results found for: "PLTCOUNTKUC", "LABPLAT", "POCPLA" RDW  Date Value Ref  Range Status  02/28/2023 13.3 11.5 - 15.5 % Final  04/19/2022 13.0 11.7 - 15.4 % Final

## 2023-04-17 NOTE — Telephone Encounter (Signed)
Requested medications are due for refill today.  yes  Requested medications are on the active medications list.  yes  Last refill. 10/04/2022 #90 1 rf  Future visit scheduled.   yes  Notes to clinic.  Rx written to expired 04/02/2023 - Rx is expired.    Requested Prescriptions  Pending Prescriptions Disp Refills   amLODipine (NORVASC) 5 MG tablet [Pharmacy Med Name: AMLODIPINE BESYLATE 5 MG TAB] 90 tablet 1    Sig: TAKE 1 TABLET (5 MG TOTAL) BY MOUTH DAILY.     Cardiovascular: Calcium Channel Blockers 2 Passed - 04/15/2023  1:31 AM      Passed - Last BP in normal range    BP Readings from Last 1 Encounters:  03/29/23 125/75         Passed - Last Heart Rate in normal range    Pulse Readings from Last 1 Encounters:  03/29/23 91         Passed - Valid encounter within last 6 months    Recent Outpatient Visits           2 months ago Chronic venous insufficiency   [place] [facility] [place], [place], NP   3 months ago Primary hypertension   [place] [place], Shea Stakes, NP   6 months ago Chronic constipation   [facility] [place], Marylene Land M, [place]   11 months ago Bronchitis   [place] [place], [place] W, NP   12 months ago Encounter to establish care   [facility] [place], Shea Stakes, NP       Future Appointments             In 2 weeks Claiborne Rigg, NP [place] [facility]   In 2 months Nahser, Deloris Ping, MD [facility]   In 2 months Claiborne Rigg, NP [facility]            Signed Prescriptions Disp Refills   metFORMIN (GLUCOPHAGE-XR) 500 MG 24 hr tablet 360 tablet 1    Sig: TAKE 2 TABLETS BY MOUTH TWICE A DAY     Endocrinology:  Diabetes - Biguanides Failed - 04/15/2023  1:31 AM      Failed -  HBA1C is between 0 and 7.9 and within 180 days    Hgb A1c MFr Bld  Date Value Ref Range Status  10/04/2022 6.5 (H) 4.8 - 5.6 % Final    Comment:             Prediabetes: 5.7 - 6.4          Diabetes: >6.4          Glycemic control for adults with diabetes: <7.0          Passed - Cr in normal range and within 360 days    Creat  Date Value Ref Range Status  02/09/2015 0.55 0.50 - 1.10 mg/dL Final   Creatinine, Ser  Date Value Ref Range Status  02/28/2023 0.77 0.44 - 1.00 mg/dL Final   Creatinine,U  Date Value Ref Range Status  04/29/2010 71.9 mg/dL Final   Creatinine, Urine  Date Value Ref Range Status  03/22/2015 27.33 >20.0 mg/dL Final    Comment:    * (PPS) Presumptive positive screen result to be verified by  quantitative LC/MS or GC/MS confirmation testing.          Passed - eGFR in normal range and within 360 days    GFR, Est African American  Date Value Ref Range Status  02/09/2015 >89 mL/min Final   GFR calc Af Amer  Date Value Ref Range Status  02/20/2020 >60 >60 mL/min Final   GFR, Est Non African American  Date Value Ref Range Status  02/09/2015 >89 mL/min Final    Comment:      The estimated GFR is a calculation valid for adults (>=57 years old) that uses the CKD-EPI algorithm to adjust for age and sex. It is   not to be used for children, pregnant women, hospitalized patients,    patients on dialysis, or with rapidly changing kidney function. According to the NKDEP, eGFR >89 is normal, 60-89 shows mild impairment, 30-59 shows moderate impairment, 15-29 shows severe impairment and <15 is ESRD.      GFR, Estimated  Date Value Ref Range Status  02/28/2023 >60 >60 mL/min Final    Comment:    (NOTE) Calculated using the CKD-EPI Creatinine Equation (2021)    eGFR  Date Value Ref Range Status  02/14/2023 94 >59 mL/min/1.73 Final         Passed - B12 Level in normal range and within 720 days    Vitamin B-12  Date Value Ref Range Status   04/19/2022 637 232 - 1,245 pg/mL Final         Passed - Valid encounter within last 6 months    Recent Outpatient Visits           2 months ago Chronic venous insufficiency   [place] [facility] [place], Shea Stakes, NP   3 months ago Primary hypertension   [facility] [place], Shea Stakes, NP   6 months ago Chronic constipation   [facility] [place], Marylene Land M, [place]   11 months ago Bronchitis   [place] [facility] [place], Shea Stakes, NP   12 months ago Encounter to establish care   [facility] Claiborne Rigg, NP       Future Appointments             In 2 weeks Claiborne Rigg, NP [facility]   In 2 months Nahser, Deloris Ping, MD [facility]   In 2 months Claiborne Rigg, NP [facility]            Passed - CBC within normal limits and completed in the last 12 months    WBC  Date Value Ref Range Status  02/28/2023 5.9 4.0 - 10.5 K/uL Final   RBC  Date Value Ref Range Status  02/28/2023 3.60 (L) 3.87 - 5.11 MIL/uL Final   Hemoglobin  Date Value Ref Range Status  02/28/2023 10.3 (L) 12.0 - 15.0 g/dL Final  16/08/9603 54.0 11.1 - 15.9 g/dL Final   HCT  Date Value Ref Range Status  02/28/2023 31.9 (L) 36.0 - 46.0 % Final   Hematocrit  Date Value Ref Range Status  04/19/2022 38.2 34.0 - 46.6 % Final   MCHC  Date Value Ref Range Status  02/28/2023 32.3 30.0 - 36.0 g/dL Final   [facility]  Date Value Ref Range Status  02/28/2023 28.6 26.0 -  34.0 pg Final   MCV  Date Value Ref Range Status  02/28/2023 88.6 80.0 - 100.0 fL Final  04/19/2022 87 79 - 97 fL Final   No results found for: "PLTCOUNTKUC", "LABPLAT", "POCPLA" RDW  Date Value Ref Range Status  02/28/2023 13.3 11.5 - 15.5 % Final  04/19/2022 13.0  11.7 - 15.4 % Final

## 2023-04-17 NOTE — Progress Notes (Signed)
  Care Coordination Note  04/17/2023 Name: Mary Gray MRN: [id number] DOB: 03/30/1961  Mary Gray is a 62 y.o. year old female who is a primary care patient of Claiborne Rigg, NP and is actively engaged with the care management team. I reached out to [name] by phone today to assist with re-scheduling a follow up visit with the RN Case Manager  Follow up plan: 2nd Unsuccessful telephone outreach attempt made. A HIPAA compliant phone message was left for the patient providing contact information and requesting a return call.   Burman Nieves, CCMA Care Coordination Care Guide Direct Dial: [phone]

## 2023-04-18 ENCOUNTER — Other Ambulatory Visit: Payer: Self-pay | Admitting: Nurse Practitioner

## 2023-04-18 ENCOUNTER — Ambulatory Visit: Payer: Medicare HMO | Admitting: Cardiovascular Disease

## 2023-04-18 DIAGNOSIS — I10 Essential (primary) hypertension: Secondary | ICD-10-CM

## 2023-04-18 NOTE — Telephone Encounter (Signed)
Please let Mary Gray know I can not refill furosemide until she sees cardiology or vascular. It looks like an appointment was canceled today with cardiology

## 2023-04-24 ENCOUNTER — Other Ambulatory Visit: Payer: Self-pay | Admitting: Nurse Practitioner

## 2023-04-25 NOTE — Telephone Encounter (Signed)
Requested Prescriptions  Pending Prescriptions Disp Refills   gabapentin (NEURONTIN) 600 MG tablet [Pharmacy Med Name: GABAPENTIN 600 MG TABLET] 270 tablet 0    Sig: TAKE 1 TABLET BY MOUTH THREE TIMES A DAY     Neurology: Anticonvulsants - gabapentin Passed - 04/24/2023  6:13 PM      Passed - Cr in normal range and within 360 days    Creat  Date Value Ref Range Status  02/09/2015 0.55 0.50 - 1.10 mg/dL Final   Creatinine, Ser  Date Value Ref Range Status  02/28/2023 0.77 0.44 - 1.00 mg/dL Final   Creatinine,U  Date Value Ref Range Status  04/29/2010 71.9 mg/dL Final   Creatinine, Urine  Date Value Ref Range Status  03/22/2015 27.33 >20.0 mg/dL Final    Comment:    * (PPS) Presumptive positive screen result to be verified by         quantitative LC/MS or GC/MS confirmation testing.          Passed - Completed PHQ-2 or PHQ-9 in the last 360 days      Passed - Valid encounter within last 12 months    Recent Outpatient Visits           3 months ago Chronic venous insufficiency   [place] [facility] [place], [place], NP   3 months ago Primary hypertension    [facility], Shea Stakes, NP   6 months ago Chronic constipation   [facility] [place], Marylene [place], [place]   11 months ago Bronchitis   [facility] [place], Shea Stakes, NP   1 year ago Encounter to establish care   [facility] Cla[facility] Rigg, NP       Future Appointments             In 1 week Claiborne Rigg, NP [facility]   In 1 month Nahser, Deloris Ping, MD [facility]   In 1 month Claiborne Rigg, NP [facility]

## 2023-04-27 NOTE — Telephone Encounter (Signed)
Return call unanswered by patient.  

## 2023-04-30 ENCOUNTER — Encounter: Payer: Self-pay | Admitting: Physician Assistant

## 2023-05-01 ENCOUNTER — Ambulatory Visit: Payer: Medicare HMO | Admitting: Physician Assistant

## 2023-05-02 ENCOUNTER — Encounter: Payer: Self-pay | Admitting: Nurse Practitioner

## 2023-05-02 ENCOUNTER — Ambulatory Visit: Payer: Medicare HMO | Attending: Nurse Practitioner | Admitting: Nurse Practitioner

## 2023-05-02 VITALS — BP 124/71 | HR 94 | Ht 61.0 in | Wt 187.2 lb

## 2023-05-02 DIAGNOSIS — R399 Unspecified symptoms and signs involving the genitourinary system: Secondary | ICD-10-CM

## 2023-05-02 DIAGNOSIS — E1165 Type 2 diabetes mellitus with hyperglycemia: Secondary | ICD-10-CM

## 2023-05-02 DIAGNOSIS — M545 Low back pain, unspecified: Secondary | ICD-10-CM | POA: Diagnosis not present

## 2023-05-02 DIAGNOSIS — Z7984 Long term (current) use of oral hypoglycemic drugs: Secondary | ICD-10-CM

## 2023-05-02 DIAGNOSIS — E7800 Pure hypercholesterolemia, unspecified: Secondary | ICD-10-CM

## 2023-05-02 DIAGNOSIS — I872 Venous insufficiency (chronic) (peripheral): Secondary | ICD-10-CM | POA: Diagnosis not present

## 2023-05-02 DIAGNOSIS — D649 Anemia, unspecified: Secondary | ICD-10-CM | POA: Diagnosis not present

## 2023-05-02 DIAGNOSIS — J441 Chronic obstructive pulmonary disease with (acute) exacerbation: Secondary | ICD-10-CM

## 2023-05-02 DIAGNOSIS — I10 Essential (primary) hypertension: Secondary | ICD-10-CM

## 2023-05-02 DIAGNOSIS — M5136 Other intervertebral disc degeneration, lumbar region: Secondary | ICD-10-CM

## 2023-05-02 DIAGNOSIS — R110 Nausea: Secondary | ICD-10-CM

## 2023-05-02 LAB — POCT GLYCOSYLATED HEMOGLOBIN (HGB A1C): HbA1c, POC (controlled diabetic range): 6.9 % (ref 0.0–7.0)

## 2023-05-02 MED ORDER — ONDANSETRON HCL 4 MG PO TABS
4.0000 mg | ORAL_TABLET | Freq: Three times a day (TID) | ORAL | 0 refills | Status: DC | PRN
Start: 2023-05-02 — End: 2023-06-05

## 2023-05-02 MED ORDER — KETOROLAC TROMETHAMINE 60 MG/2ML IM SOLN
30.0000 mg | Freq: Once | INTRAMUSCULAR | Status: AC
Start: 2023-05-02 — End: 2023-05-07

## 2023-05-02 MED ORDER — MELOXICAM 7.5 MG PO TABS
7.5000 mg | ORAL_TABLET | Freq: Every day | ORAL | 0 refills | Status: DC
Start: 2023-05-02 — End: 2023-05-29

## 2023-05-02 NOTE — Progress Notes (Signed)
Assessment & Plan:  Mary Gray was seen today for diabetes and hypertension.  Diagnoses and all orders for this visit:  Uncontrolled type 2 diabetes mellitus with hyperglycemia (HCC) -     POCT glycosylated hemoglobin (Hb A1C) -     CMP14+EGFR Continue blood sugar control as discussed in office today, low carbohydrate diet, and regular physical exercise as tolerated, 150 minutes per week (30 min each day, 5 days per week, or 50 min 3 days per week). Keep blood sugar logs with fasting goal of 90-130 mg/dl, post prandial (after you eat) less than 180.  For Hypoglycemia: BS <60 and Hyperglycemia BS >400; contact the clinic ASAP. Annual eye exams and foot exams are recommended.   UTI symptoms -     Urinalysis, Complete  Nausea -     ondansetron (ZOFRAN) 4 MG tablet; Take 1 tablet (4 mg total) by mouth every 8 (eight) hours as needed for nausea or vomiting. NO REFILLS until seen by GI  Chronic venous insufficiency -     Ambulatory referral to Vascular Surgery  Lumbar degenerative disc disease -     Ambulatory referral to Pain Clinic -     ketorolac (TORADOL) injection 30 mg -     meloxicam (MOBIC) 7.5 MG tablet; Take 1 tablet (7.5 mg total) by mouth daily.  Pure hypercholesterolemia -     Lipid panel INSTRUCTIONS: Work on a low fat, heart healthy diet and participate in regular aerobic exercise program by working out at least 150 minutes per week; 5 days a week-30 minutes per day. Avoid red meat/beef/steak,  fried foods. junk foods, sodas, sugary drinks, unhealthy snacking, alcohol and smoking.  Drink at least 80 oz of water per day and monitor your carbohydrate intake daily.    Anemia, unspecified type -     CBC with Differential  COPD exacerbation (HCC) -     Ambulatory referral to Pulmonology Continue wilexa as prescribed    Patient has been counseled on age-appropriate routine health concerns for screening and prevention. These are reviewed and up-to-date. Referrals have been  placed accordingly. Immunizations are up-to-date or declined.    Subjective:   Chief Complaint  Patient presents with   Diabetes   Hypertension   HPI Mary Gray 62 y.o. female presents to office today for follow up to HTN and DM  She has a past medical history of Back pain, Chronic interstitial cystitis, COPD, DDD lumbar, Degeneration of cervical intervertebral disc, Depression, Diabetes mellitus, type 2 (2007), Heme positive stool, Hypertension, Hypothyroidism, Iron deficiency anemia, and Spinal stenosis Tobacco Dependence    Chronic back pain She has chronic back pain with a history of degenerative disc disease and spinal stenosis. Was dismissed from previous pain clinic.    She is requesting refill of zofran. I have instructed her this is not a medication that should be refilled repeatedly for her. I did refer her to GI however she was a no show for her appt yesterday. States she has been Experiencing re occuring nausea and diarrhea over the past few days.  Her boyfriend was also experiencing some sort of GI bug.   DM 2 Well controlled with metformin 1000 mg BID Lab Results  Component Value Date   HGBA1C 6.9 05/02/2023    Lab Results  Component Value Date   HGBA1C 6.5 (H) 10/04/2022     Blood pressure is well controlled with vasotec 20 mg and amlodipine 5 mg daily.  BP Readings from Last 3 Encounters:  05/02/23  124/71  03/29/23 125/75  03/23/23 115/71    COPD The exam room smells of cigarette smoke despite Ms. Molinaro stating she only smokes a few cigarettes daily.  She has chronic cough and malaise. Chest CT pending for lung cancer screening. Current inhalers: wilexa and albuterol  She has chronic BLE edema. I do not believe this is due to any heart failure and more of a chronic venous insufficiency. She does have a history of heavy tobacco use. I would not recommend prescribing any additional furosemide until  she has been evaluated by vein and vascular.   Review of  Systems  Constitutional:  Negative for fever, malaise/fatigue and weight loss.  HENT: Negative.  Negative for nosebleeds.   Eyes: Negative.  Negative for blurred vision, double vision and photophobia.  Respiratory:  Positive for cough, sputum production and shortness of breath. Negative for hemoptysis.   Cardiovascular:  Positive for leg swelling. Negative for chest pain, palpitations and orthopnea.  Gastrointestinal:  Positive for constipation, diarrhea and nausea. Negative for abdominal pain, heartburn, melena and vomiting.  Genitourinary:  Positive for frequency.  Musculoskeletal:  Positive for back pain. Negative for myalgias.  Neurological: Negative.  Negative for dizziness, focal weakness, seizures and headaches.  Psychiatric/Behavioral: Negative.  Negative for suicidal ideas.     Past Medical History:  Diagnosis Date   Asthma    Back pain    Chronic interstitial cystitis    Followed by Dr. Marcine Matar   COPD (chronic obstructive pulmonary disease) (HCC)    DDD (degenerative disc disease), lumbar    Degeneration of cervical intervertebral disc    Depression    Diabetes mellitus, type 2 (HCC) 2007   Heme positive stool    EGD on 04/08/2010 by Dr. Ewing Schlein showed chronic gastritis and a few gastric polyps; pathology showed a fundic gland polyp.  Colonoscopy on 04/08/2010 showed small external and internal hemorrhoids, and a few benign-appearing diminutive polyps in the rectum, the distal sigmoid colon, and in the distal descending colon; pathology showed hyperplastic polyps.    Hypertension    Hypothyroidism    Iron deficiency anemia    Spinal stenosis     Past Surgical History:  Procedure Laterality Date   CARPAL TUNNEL RELEASE  05/08/2000   By Dr. Katy Fitch. Sypher, Montez Hageman.    Family History  Problem Relation Age of Onset   Heart attack Father 55   Diabetes Mother    Hypertension Mother    Diabetes Sister    Breast cancer Neg Hx    Colon cancer Neg Hx     Social  History Reviewed with no changes to be made today.   Outpatient Medications Prior to Visit  Medication Sig Dispense Refill   Accu-Chek Softclix Lancets lancets Check blood sugar 1 time per day 100 each 3   acetaminophen (TYLENOL) 500 MG tablet Take 1,000 mg by mouth every 6 (six) hours as needed for mild pain.     albuterol (VENTOLIN HFA) 108 (90 Base) MCG/ACT inhaler Inhale 1-2 puffs into the lungs every 4 (four) hours as needed for wheezing or shortness of breath. 18 g 0   amitriptyline (ELAVIL) 25 MG tablet Take 1 tablet (25 mg total) by mouth at bedtime. 90 tablet 3   amLODipine (NORVASC) 5 MG tablet Take 1 tablet (5 mg total) by mouth daily. 90 tablet 0   aspirin EC 81 MG tablet Take 81 mg by mouth daily. Swallow whole.     azithromycin (ZITHROMAX) 250 MG tablet Day 1:  take 2 tablets. Day 2-5: Take 1 tablet daily. 6 tablet 0   [facility] VITAMIN B12 1000 MCG tablet TAKE 1 TABLET BY MOUTH EVERY DAY (Patient taking differently: Take 1,000 mcg by mouth daily.) 90 tablet 3   cyclobenzaprine (FLEXERIL) 5 MG tablet Take 1 tablet (5 mg total) by mouth at bedtime. 30 tablet 0   dextromethorphan-guaiFENesin (TUSSIN DM) 10-100 MG/5ML liquid Take 10 mLs by mouth every 6 (six) hours as needed for cough. 500 mL 0   enalapril (VASOTEC) 20 MG tablet Take 1 tablet (20 mg total) by mouth daily. 90 tablet 0   esomeprazole (NEXIUM) 40 MG capsule TAKE 1 CAPSULE (40 MG TOTAL) BY MOUTH DAILY. 90 capsule 1   fluticasone-salmeterol (WIXELA INHUB) 250-50 MCG/ACT AEPB Inhale 1 puff into the lungs in the morning and at bedtime. 60 each 6   gabapentin (NEURONTIN) 600 MG tablet TAKE 1 TABLET BY MOUTH THREE TIMES A DAY 270 tablet 0   glucose blood (ACCU-CHEK GUIDE) test strip CHECK BLOOD SUGAR 1 TIME PER DAY 100 strip 3   ipratropium-albuterol (DUONEB) 0.5-2.5 (3) MG/3ML SOLN Take 3 mLs by nebulization every 4 (four) hours as needed. 75 mL 0   levocetirizine (XYZAL) 5 MG tablet Take 1 tablet (5 mg total) by mouth every evening.  30 tablet 5   levothyroxine (SYNTHROID) 88 MCG tablet Take 1 tablet (88 mcg total) by mouth daily before breakfast. 90 tablet 2   metFORMIN (GLUCOPHAGE-XR) 500 MG 24 hr tablet TAKE 2 TABLETS BY MOUTH TWICE A DAY 360 tablet 1   Misc. Devices MISC Provide patient with shower chair and grab bar. ICD10 M17.10 M54.16 1 each 0   multivitamin-iron-minerals-folic acid (CENTRUM) chewable tablet Chew 1 tablet by mouth daily. 90 tablet 3   naloxegol oxalate (MOVANTIK) 25 MG TABS tablet Take 1 tablet (25 mg total) by mouth daily. 90 tablet 1   nystatin (NYSTATIN) powder Apply 1 application topically 3 (three) times daily. 15 g 0   nystatin cream (MYCOSTATIN) Apply 1 Application topically 2 (two) times daily. 60 g 3   PREMARIN vaginal cream PLACE 1 APPLICATORFUL VAGINALLY DAILY. (Patient taking differently: 1 applicator 2 (two) times a week.) 30 g 1   rosuvastatin (CRESTOR) 20 MG tablet Take 1 tablet (20 mg total) by mouth daily. 90 tablet 3   Tiotropium Bromide Monohydrate (SPIRIVA RESPIMAT) 2.5 MCG/ACT AERS Inhale 2 puffs into the lungs daily. 4 g 0   triamcinolone (KENALOG) 0.025 % ointment Apply 1 Application topically 2 (two) times daily. 30 g 0   zolpidem (AMBIEN) 5 MG tablet TAKE 1 TABLET BY MOUTH EVERYDAY AT BEDTIME 30 tablet 1   ibuprofen (ADVIL) 200 MG tablet Take 400 mg by mouth every 6 (six) hours as needed for fever, headache or mild pain.     ondansetron (ZOFRAN) 4 MG tablet TAKE 1 TABLET BY MOUTH EVERY 8 HOURS AS NEEDED FOR NAUSEA AND VOMITING 30 tablet 0   promethazine-dextromethorphan (PROMETHAZINE-DM) 6.25-15 MG/5ML syrup Take 5 mLs by mouth 3 (three) times daily as needed for cough. 200 mL 0   No facility-administered medications prior to visit.    Allergies  Allergen Reactions   Amoxicillin Other (See Comments)   Codeine Sulfate Other (See Comments)    Confusion, anxiety, fatigue   Meperidine And Related Nausea And Vomiting   Minocycline Hcl Nausea And Vomiting    Hospitalized for  vomiting along with allergic reaction   Penicillins Other (See Comments)    Has patient had a PCN reaction causing  immediate rash, facial/tongue/throat swelling, SOB or lightheadedness with hypotension: Yes Has patient had a PCN reaction causing severe rash involving mucus membranes or skin necrosis: Yes Has patient had a PCN reaction that required hospitalization Yes Has patient had a PCN reaction occurring within the last 10 years: Yes If all of the above answers are "NO", then may proceed with Cephalosporin use.    Propoxyphene N-Acetaminophen Other (See Comments)    confused   Tetracycline Hcl Other (See Comments)    Vomiting Note: Patient took a course of doxycycline in June 2014 without any problems.       Objective:    BP 124/71 (BP Location: Left Arm, Patient Position: Sitting, Cuff Size: Normal)   Pulse 94   Ht 5\' 1"  (1.549 m)   Wt 187 lb 3.2 oz (84.9 kg)   LMP 01/17/2009   SpO2 98%   BMI 35.37 kg/m  Wt Readings from Last 3 Encounters:  05/02/23 187 lb 3.2 oz (84.9 kg)  01/31/23 180 lb 8.9 oz (81.9 kg)  01/03/23 180 lb 9.6 oz (81.9 kg)    Physical Exam Vitals and nursing note reviewed.  Constitutional:      Appearance: She is well-developed.  HENT:     Head: Normocephalic and atraumatic.  Cardiovascular:     Rate and Rhythm: Normal rate and regular rhythm.     Heart sounds: Murmur heard.     No friction rub. No gallop.  Pulmonary:     Effort: Pulmonary effort is normal. No tachypnea or respiratory distress.     Breath sounds: Normal breath sounds. No decreased breath sounds, wheezing, rhonchi or rales.  Chest:     Chest wall: No tenderness.  Abdominal:     General: Bowel sounds are normal.     Palpations: Abdomen is soft.  Musculoskeletal:        General: Normal range of motion.     Cervical back: Normal range of motion.  Skin:    General: Skin is warm and dry.  Neurological:     Mental Status: She is alert and oriented to person, place, and time.      Coordination: Coordination normal.  Psychiatric:        Behavior: Behavior normal. Behavior is cooperative.        Thought Content: Thought content normal.        Judgment: Judgment normal.          Patient has been counseled extensively about nutrition and exercise as well as the importance of adherence with medications and regular follow-up. The patient was given clear instructions to go to ER or return to medical center if symptoms don't improve, worsen or new problems develop. The patient verbalized understanding.   Follow-up: Return in about 3 months (around 08/02/2023).   Claiborne Rigg, FNP-BC [facility] [place], [place] [phone]   05/04/2023, 11:09 PM

## 2023-05-02 NOTE — Patient Instructions (Addendum)
Placed in [facility]  [address] [place] [address] Ph# [phone]  Placed in [facility]   [address]  [place], [place] [address] Ph# [phone]

## 2023-05-03 LAB — CBC WITH DIFFERENTIAL/PLATELET
Basophils Absolute: 0.1 10*3/uL (ref 0.0–0.2)
Basos: 1 %
EOS (ABSOLUTE): 0.4 10*3/uL (ref 0.0–0.4)
Eos: 6 %
Hematocrit: 31.9 % — ABNORMAL LOW (ref 34.0–46.6)
Hemoglobin: 10.3 g/dL — ABNORMAL LOW (ref 11.1–15.9)
Immature Grans (Abs): 0 10*3/uL (ref 0.0–0.1)
Immature Granulocytes: 0 %
Lymphocytes Absolute: 1.2 10*3/uL (ref 0.7–3.1)
Lymphs: 20 %
MCH: 26.7 pg (ref 26.6–33.0)
MCHC: 32.3 g/dL (ref 31.5–35.7)
MCV: 83 fL (ref 79–97)
Monocytes Absolute: 0.4 10*3/uL (ref 0.1–0.9)
Monocytes: 7 %
Neutrophils Absolute: 4.1 10*3/uL (ref 1.4–7.0)
Neutrophils: 66 %
Platelets: 268 10*3/uL (ref 150–450)
RBC: 3.86 x10E6/uL (ref 3.77–5.28)
RDW: 15.1 % (ref 11.7–15.4)
WBC: 6.2 10*3/uL (ref 3.4–10.8)

## 2023-05-03 LAB — CMP14+EGFR
ALT: 23 IU/L (ref 0–32)
AST: 34 IU/L (ref 0–40)
Albumin/Globulin Ratio: 2.1
Albumin: 4.2 g/dL (ref 3.9–4.9)
Alkaline Phosphatase: 75 IU/L (ref 44–121)
BUN/Creatinine Ratio: 11 — ABNORMAL LOW (ref 12–28)
BUN: 7 mg/dL — ABNORMAL LOW (ref 8–27)
Bilirubin Total: <0.2 mg/dL (ref 0.0–1.2)
CO2: 27 mmol/L (ref 20–29)
Calcium: 9.2 mg/dL (ref 8.7–10.3)
Chloride: 90 mmol/L — ABNORMAL LOW (ref 96–106)
Creatinine, Ser: 0.62 mg/dL (ref 0.57–1.00)
Globulin, Total: 2 g/dL (ref 1.5–4.5)
Glucose: 223 mg/dL — ABNORMAL HIGH (ref 70–99)
Potassium: 4.1 mmol/L (ref 3.5–5.2)
Sodium: 130 mmol/L — ABNORMAL LOW (ref 134–144)
Total Protein: 6.2 g/dL (ref 6.0–8.5)
eGFR: 101 mL/min/{1.73_m2} (ref >59)

## 2023-05-03 LAB — LIPID PANEL
Chol/HDL Ratio: 2.8 ratio (ref 0.0–4.4)
Cholesterol, Total: 111 mg/dL (ref 100–199)
HDL: 39 mg/dL — ABNORMAL LOW (ref >39)
LDL Chol Calc (NIH): 39 mg/dL (ref 0–99)
Triglycerides: 205 mg/dL — ABNORMAL HIGH (ref 0–149)
VLDL Cholesterol Cal: 33 mg/dL (ref 5–40)

## 2023-05-04 ENCOUNTER — Encounter: Payer: Self-pay | Admitting: Nurse Practitioner

## 2023-05-04 NOTE — Progress Notes (Signed)
  Care Coordination Note  05/04/2023 Name: Mary Gray MRN: [id number] DOB: [birth year]  Mary Gray is a 62 y.o. year old female who is a primary care patient of Claiborne Rigg, NP and is actively engaged with the care management team. I reached out to [name] by phone today to assist with re-scheduling a follow up visit with the Licensed Clinical Social Worker  Follow up plan: We have been unable to make contact with the patient for follow up.   Burman Nieves, CCMA Care Coordination Care Guide Direct Dial: [phone]

## 2023-05-09 MED ORDER — KETOROLAC TROMETHAMINE 60 MG/2ML IM SOLN
60.0000 mg | Freq: Once | INTRAMUSCULAR | Status: AC
Start: 2023-05-09 — End: 2023-05-02
  Administered 2023-05-02: 30 mg via INTRAMUSCULAR

## 2023-05-09 NOTE — Addendum Note (Signed)
Addended by: Arbie Cookey on: 05/09/2023 12:20 PM   Modules accepted: Orders

## 2023-05-10 ENCOUNTER — Ambulatory Visit: Payer: Self-pay

## 2023-05-10 NOTE — Telephone Encounter (Signed)
  Chief Complaint: Dry cough - medication request Symptoms: Dry cough - ear pain - mild nausea from heat Frequency: 2 days Pertinent Negatives: Patient denies  Disposition: [] ED /[] Urgent Care (no appt availability in office) / [] Appointment(In office/virtual)/ []  [place] [facility]/ [] Home Care/ [x] Refused Recommended Disposition /[] [place] [facility]/ []  Follow-up with PCP Additional Notes: Pt called requesting a medication be called in for her cough. She states that she has a dry cough - coughing 6-8 times per hour. She states it is from the heat. She also mentioned ear pain. Pt does not want to come into the office, nor go to UC.  Please advise.    Summary: cough   Pt states due to the air and the humidity she has a dry cough and she cannot cough anything up. Pt is wanting to see if her PCP will call her in some cough medication to cough up what she has.  Please advise.   [id number] [place], [place] - [address] AT [facility] [address] Phone: [phone] Fax: [phone]       Reason for Disposition  Cough  Answer Assessment - Initial Assessment Questions 1. ONSET: "When did the cough begin?"      2 days ago 2. SEVERITY: "How bad is the cough today?"      Coughing quite a lot 6-8 times an hour 3. SPUTUM: "Describe the color of your sputum" (none, dry cough; clear, white, yellow, green)     None - dry cough 4. HEMOPTYSIS: "Are you coughing up any blood?" If so ask: "How much?" (flecks, streaks, tablespoons, etc.)     no 5. DIFFICULTY BREATHING: "Are you having difficulty breathing?" If Yes, ask: "How bad is it?" (e.g., mild, moderate, severe)    - MILD: No SOB at rest, mild SOB with walking, speaks normally in sentences, can lie down, no retractions, pulse < 100.    - MODERATE: SOB at rest, SOB with minimal exertion and prefers to sit, cannot lie down flat, speaks in phrases, mild retractions, audible wheezing, pulse 100-120.    - SEVERE: Very SOB  at rest, speaks in single words, struggling to breathe, sitting hunched forward, retractions, pulse > 120      Fine as long as she is inside 6. FEVER: "Do you have a fever?" If Yes, ask: "What is your temperature, how was it measured, and when did it start?"     no 7. CARDIAC HISTORY: "Do you have any history of heart disease?" (e.g., heart attack, congestive heart failure)      Yes - HTN 8. LUNG HISTORY: "Do you have any history of lung disease?"  (e.g., pulmonary embolus, asthma, emphysema)     COPD 10. OTHER SYMPTOMS: "Do you have any other symptoms?" (e.g., runny nose, wheezing, chest pain)       Nausea from heat.  Protocols used: Cough - Acute Non-Productive-A-AH

## 2023-05-11 ENCOUNTER — Other Ambulatory Visit: Payer: Self-pay | Admitting: Nurse Practitioner

## 2023-05-11 ENCOUNTER — Ambulatory Visit: Payer: Self-pay

## 2023-05-11 DIAGNOSIS — J069 Acute upper respiratory infection, unspecified: Secondary | ICD-10-CM

## 2023-05-11 DIAGNOSIS — J441 Chronic obstructive pulmonary disease with (acute) exacerbation: Secondary | ICD-10-CM

## 2023-05-11 MED ORDER — GUAIFENESIN 200 MG/10ML PO LIQD
10.0000 mL | Freq: Four times a day (QID) | ORAL | 0 refills | Status: DC | PRN
Start: 2023-05-11 — End: 2023-05-11

## 2023-05-11 MED ORDER — DEXTROMETHORPHAN-GUAIFENESIN 10-100 MG/5ML PO LIQD
10.0000 mL | Freq: Four times a day (QID) | ORAL | 0 refills | Status: DC | PRN
Start: 2023-05-11 — End: 2023-09-19

## 2023-05-11 NOTE — Telephone Encounter (Signed)
Call placed to patient unable to reach message left on VM.   To advise the cough syrup has been sen to her pharmacy as requested

## 2023-05-11 NOTE — Telephone Encounter (Signed)
  Chief Complaint: Medication problem Symptoms: medicine prescribed makes the patient sick to her stomach and itchy. Frequency: When she takes the medicine. Pertinent Negatives: Patient denies other symptoms. Disposition: [] ED /[] Urgent Care (no appt availability in office) / [] Appointment(In office/virtual)/ []  [place] [facility]/ [x] Home Care/ [] Refused Recommended Disposition /[] [place] [facility]/ []  Follow-up with PCP Additional Notes: Patient states she can not take the medication prescribed. When taken in the past it has made the patient sick to the stomach and itchy. Patient states the medication that helps with her cough in the past was Promethazine DM. Patient requesting a new Rx for promethazine DM be sent to the pharmacy.      Summary: cough / rx req   The patient shares that they have recently been prescribed dextromethorphan-guaiFENesin (TUSSIN DM) 10-100 MG/5ML liquid [[id number]] which they are unable to take  The patient would like to be contacted by a member of staff to discuss other options for their cough  Please contact the patient further when possible     Reason for Disposition  [1] Caller has NON-URGENT medicine question about med that PCP prescribed AND [2] triager unable to answer question  Answer Assessment - Initial Assessment Questions 1. NAME of MEDICINE: "What medicine(s) are you calling about?"     Dextromethorphan-guaifenesin  2. QUESTION: "What is your question?" (e.g., double dose of medicine, side effect)     Medication makes patient sick to her stomach and itchy  3. PRESCRIBER: "Who prescribed the medicine?" Reason: if prescribed by specialist, call should be referred to that group.     Bertram Denver, NP 4. SYMPTOMS: "Do you have any symptoms?" If Yes, ask: "What symptoms are you having?"  "How bad are the symptoms (e.g., mild, moderate, severe)     Itchy and sick to the stomach  5. PREGNANCY:  "Is there any chance that you are pregnant?"  "When was your last menstrual period?"     N/a  Protocols used: Medication Question Call-A-AH

## 2023-05-11 NOTE — Telephone Encounter (Signed)
Cough syrup sent .

## 2023-05-13 ENCOUNTER — Other Ambulatory Visit: Payer: Self-pay | Admitting: Nurse Practitioner

## 2023-05-14 ENCOUNTER — Other Ambulatory Visit: Payer: Self-pay | Admitting: Nurse Practitioner

## 2023-05-14 MED ORDER — PROMETHAZINE-DM 6.25-15 MG/5ML PO SYRP: 5.0000 mL | ORAL_SOLUTION | Freq: Four times a day (QID) | ORAL | 0 refills | Status: DC | PRN

## 2023-05-14 NOTE — Telephone Encounter (Signed)
Unable to refill per protocol, Rx expired. Discontinued 02/20/23, ineffective.  Requested Prescriptions  Pending Prescriptions Disp Refills   WIXELA INHUB 100-50 MCG/ACT AEPB [Pharmacy Med Name: WIXELA 100-50 INHUB] 60 each 3    Sig: INHALE 1 PUFF INTO THE LUNGS TWICE A DAY     Pulmonology:  [facility] [employer] Passed - 05/13/2023  2:17 AM      Passed - Valid encounter within last 12 months    Recent Outpatient Visits           1 week ago Uncontrolled type 2 diabetes mellitus with hyperglycemia [facility])   [facility], Shea Stakes, NP   3 months ago Chronic venous insufficiency   [facility] [place], Shea Stakes, NP   4 months ago Primary hypertension   [facility] [place], Shea Stakes, NP   7 months ago Chronic constipation   [facility] [place], Marylene Land [place], [place]   12 months ago Bronchitis   [place] [facility] [place], Shea Stakes, NP       Future Appointments             In 1 month Nahser, Deloris Ping, MD [facility]   In 2 months Claiborne Rigg, NP [facility]

## 2023-05-17 ENCOUNTER — Inpatient Hospital Stay: Admission: RE | Admit: 2023-05-17 | Payer: Medicare HMO | Source: Ambulatory Visit

## 2023-05-21 ENCOUNTER — Other Ambulatory Visit: Payer: Medicare HMO

## 2023-05-22 ENCOUNTER — Other Ambulatory Visit: Payer: Self-pay | Admitting: Nurse Practitioner

## 2023-05-22 DIAGNOSIS — G894 Chronic pain syndrome: Secondary | ICD-10-CM

## 2023-05-22 DIAGNOSIS — M47816 Spondylosis without myelopathy or radiculopathy, lumbar region: Secondary | ICD-10-CM

## 2023-05-22 DIAGNOSIS — M51369 Other intervertebral disc degeneration, lumbar region without mention of lumbar back pain or lower extremity pain: Secondary | ICD-10-CM

## 2023-05-22 DIAGNOSIS — G2581 Restless legs syndrome: Secondary | ICD-10-CM

## 2023-05-22 DIAGNOSIS — F418 Other specified anxiety disorders: Secondary | ICD-10-CM

## 2023-05-22 DIAGNOSIS — R21 Rash and other nonspecific skin eruption: Secondary | ICD-10-CM

## 2023-05-22 DIAGNOSIS — M5136 Other intervertebral disc degeneration, lumbar region: Secondary | ICD-10-CM

## 2023-05-23 ENCOUNTER — Telehealth: Payer: Self-pay

## 2023-05-23 ENCOUNTER — Other Ambulatory Visit: Payer: Self-pay | Admitting: Family Medicine

## 2023-05-23 DIAGNOSIS — R21 Rash and other nonspecific skin eruption: Secondary | ICD-10-CM

## 2023-05-23 NOTE — Telephone Encounter (Signed)
Copied from CRM [id number]. Topic: Referral - Status >> May 23, 2023  3:42 PM Carrielelia G wrote: Patient calling on the status of her referral to the PAIN CLINIC Please advise. Thank you

## 2023-05-29 ENCOUNTER — Telehealth: Payer: Self-pay | Admitting: Nurse Practitioner

## 2023-05-29 ENCOUNTER — Other Ambulatory Visit: Payer: Self-pay | Admitting: Nurse Practitioner

## 2023-05-29 ENCOUNTER — Ambulatory Visit: Payer: Self-pay | Admitting: *Deleted

## 2023-05-29 DIAGNOSIS — M5136 Other intervertebral disc degeneration, lumbar region: Secondary | ICD-10-CM

## 2023-05-29 NOTE — Telephone Encounter (Signed)
Summary: back pain   Pt states that she is having back pain and she is wanting to see if Tramadol could be called in for her. Pt states that her and her PCP talked about this at her last appt.  Please advise.   [id number] [place], [place] - [address] [facility] [address] Phone: [phone] Fax: [phone]         Reason for Disposition  Prescription request for new medicine (not a refill)  Answer Assessment - Initial Assessment Questions 1. NAME of MEDICINE: "What medicine(s) are you calling about?"     Tramadol- patient has had success with pain- "a little" 2. QUESTION: "What is your question?" (e.g., double dose of medicine, side effect)     Requesting Rx- using muscle spasm Rx- Flexeril at night, 3 gabapentin /day, tylenol, patient had Rx- but patient is out of medication now 3. PRESCRIBER: "Who prescribed the medicine?" Reason: if prescribed by specialist, call should be referred to that group.     PCP 4. SYMPTOMS: "Do you have any symptoms?" If Yes, ask: "What symptoms are you having?"  "How bad are the symptoms (e.g., mild, moderate, severe)      Back pain- pain is so bad patient reports she is going to start using her cane. Pain in spine and joints- patient is using bio freeze and OTC rub, ice, Rx medication. Patient has appointment 7/30 at pain clinic. Patient is requesting Rx to get her to that appointment. Patient was last in office 05/02/23 where she received the referral.  [facility]  Protocols used: Medication Question Call-A-AH

## 2023-05-29 NOTE — Telephone Encounter (Signed)
  Chief Complaint: medication request-Tramadol.  Last office appointment 05/02/23- with referral appointment- 06/19/23 Symptoms: back pain, joint pain  Frequency: chronic  Disposition: [] ED /[] Urgent Care (no appt availability in office) / [] Appointment(In office/virtual)/ []  [place] Virtual [facility]/ [] Home Care/ [] Refused Recommended Disposition /[] [place] [facility]/ [x]  Follow-up with PCP Additional Notes: Patient is requesting medication bridge to appointment with pain clinic 06/19/23- patient states her pain is severe and uncontrolled. Patient states she is trying to avoid surgery if possible and wants to see what the pain clinic can do for her.

## 2023-05-29 NOTE — Telephone Encounter (Signed)
Requested medication (s) are due for refill today - yes  Requested medication (s) are on the active medication list -yes  Future visit scheduled -yes  Last refill: 05/02/23 #30  Notes to clinic: abnormal labs- sent for review   Requested Prescriptions  Pending Prescriptions Disp Refills   meloxicam (MOBIC) 7.5 MG tablet [Pharmacy Med Name: MELOXICAM 7.5 MG TABLET] 30 tablet 0    Sig: TAKE 1 TABLET BY MOUTH EVERY DAY     Analgesics:  COX2 Inhibitors Failed - 05/29/2023  2:41 AM      Failed - Manual Review: Labs are only required if the patient has taken medication for more than 8 weeks.      Failed - HGB in normal range and within 360 days    Hemoglobin  Date Value Ref Range Status  05/02/2023 10.3 (L) 11.1 - 15.9 g/dL Final         Failed - HCT in normal range and within 360 days    Hematocrit  Date Value Ref Range Status  05/02/2023 31.9 (L) 34.0 - 46.6 % Final         Passed - Cr in normal range and within 360 days    Creat  Date Value Ref Range Status  02/09/2015 0.55 0.50 - 1.10 mg/dL Final   Creatinine, Ser  Date Value Ref Range Status  05/02/2023 0.62 0.57 - 1.00 mg/dL Final   Creatinine,U  Date Value Ref Range Status  04/29/2010 71.9 mg/dL Final   Creatinine, Urine  Date Value Ref Range Status  03/22/2015 27.33 >20.0 mg/dL Final    Comment:    * (PPS) Presumptive positive screen result to be verified by         quantitative LC/MS or GC/MS confirmation testing.          Passed - AST in normal range and within 360 days    AST  Date Value Ref Range Status  05/02/2023 34 0 - 40 IU/L Final         Passed - ALT in normal range and within 360 days    ALT  Date Value Ref Range Status  05/02/2023 23 0 - 32 IU/L Final         Passed - eGFR is 30 or above and within 360 days    GFR, Est African American  Date Value Ref Range Status  02/09/2015 >89 mL/min Final   GFR calc Af Amer  Date Value Ref Range Status  02/20/2020 >60 >60 mL/min Final   GFR, Est  Non African American  Date Value Ref Range Status  02/09/2015 >89 mL/min Final    Comment:      The estimated GFR is a calculation valid for adults (>=20 years old) that uses the CKD-EPI algorithm to adjust for age and sex. It is   not to be used for children, pregnant women, hospitalized patients,    patients on dialysis, or with rapidly changing kidney function. According to the NKDEP, eGFR >89 is normal, 60-89 shows mild impairment, 30-59 shows moderate impairment, 15-29 shows severe impairment and <15 is ESRD.      GFR, Estimated  Date Value Ref Range Status  02/28/2023 >60 >60 mL/min Final    Comment:    (NOTE) Calculated using the CKD-EPI Creatinine Equation (2021)    eGFR  Date Value Ref Range Status  05/02/2023 101 >59 mL/min/1.73 Final         Passed - Patient is not pregnant      Passed -  Valid encounter within last 12 months    Recent Outpatient Visits           3 weeks ago Uncontrolled type 2 diabetes mellitus with hyperglycemia [facility])   [place] [facility] [place], [place] W, NP   4 months ago Chronic venous insufficiency   [place] [facility] [place], Shea Stakes, NP   4 months ago Primary hypertension   [place] [facility] [place], Shea Stakes, NP   7 months ago Chronic constipation   [facility] [place], Marzella [place]lein, [place]   1 year ago Bronchitis   [place] [facility] Claiborne Rigg, NP       Future Appointments             In 3 weeks Nahser, Deloris Ping, MD [facility]   In 2 months Claiborne Rigg, NP [facility]               Requested Prescriptions  Pending Prescriptions Disp Refills   meloxicam (MOBIC) 7.5 MG tablet [Pharmacy Med Name: MELOXICAM 7.5 MG TABLET] 30 tablet 0    Sig: TAKE 1 TABLET BY MOUTH EVERY DAY     Analgesics:   COX2 Inhibitors Failed - 05/29/2023  2:41 AM      Failed - Manual Review: Labs are only required if the patient has taken medication for more than 8 weeks.      Failed - HGB in normal range and within 360 days    Hemoglobin  Date Value Ref Range Status  05/02/2023 10.3 (L) 11.1 - 15.9 g/dL Final         Failed - HCT in normal range and within 360 days    Hematocrit  Date Value Ref Range Status  05/02/2023 31.9 (L) 34.0 - 46.6 % Final         Passed - Cr in normal range and within 360 days    Creat  Date Value Ref Range Status  02/09/2015 0.55 0.50 - 1.10 mg/dL Final   Creatinine, Ser  Date Value Ref Range Status  05/02/2023 0.62 0.57 - 1.00 mg/dL Final   Creatinine,U  Date Value Ref Range Status  04/29/2010 71.9 mg/dL Final   Creatinine, Urine  Date Value Ref Range Status  03/22/2015 27.33 >20.0 mg/dL Final    Comment:    * (PPS) Presumptive positive screen result to be verified by         quantitative LC/MS or GC/MS confirmation testing.          Passed - AST in normal range and within 360 days    AST  Date Value Ref Range Status  05/02/2023 34 0 - 40 IU/L Final         Passed - ALT in normal range and within 360 days    ALT  Date Value Ref Range Status  05/02/2023 23 0 - 32 IU/L Final         Passed - eGFR is 30 or above and within 360 days    GFR, Est African American  Date Value Ref Range Status  02/09/2015 >89 mL/min Final   GFR calc Af Amer  Date Value Ref Range Status  02/20/2020 >60 >60 mL/min Final   GFR, Est Non African American  Date Value Ref Range Status  02/09/2015 >89 mL/min Final    Comment:  The estimated GFR is a calculation valid for adults (>=82 years old) that uses the CKD-EPI algorithm to adjust for age and sex. It is   not to be used for children, pregnant women, hospitalized patients,    patients on dialysis, or with rapidly changing kidney function. According to the NKDEP, eGFR >89 is normal, 60-89 shows mild impairment,  30-59 shows moderate impairment, 15-29 shows severe impairment and <15 is ESRD.      GFR, Estimated  Date Value Ref Range Status  02/28/2023 >60 >60 mL/min Final    Comment:    (NOTE) Calculated using the CKD-EPI Creatinine Equation (2021)    eGFR  Date Value Ref Range Status  05/02/2023 101 >59 mL/min/1.73 Final         Passed - Patient is not pregnant      Passed - Valid encounter within last 12 months    Recent Outpatient Visits           3 weeks ago Uncontrolled type 2 diabetes mellitus with hyperglycemia [facility] [place])   [place] [facility] [place], Shea Stakes, NP   4 months ago Chronic venous insufficiency   [place] [facility] [place], Shea Stakes, NP   4 months ago Primary hypertension   [place] [facility] [place], Shea Stakes, NP   7 months ago Chronic constipation   [facility] [place], [place], [place]   1 year ago Bronchitis   [place] [facility] [place], Shea Stakes, NP       Future Appointments             In 3 weeks Nahser, Deloris Ping, MD [facility] at [facility]   In 2 months Claiborne Rigg, NP [facility]

## 2023-05-29 NOTE — Telephone Encounter (Signed)
Unable to reach patient by phone to relay results.  Voicemail left to return call for information.   Message sent with information through my chart.

## 2023-05-29 NOTE — Telephone Encounter (Signed)
Copied from CRM [id number]. Topic: General - Other >> May 29, 2023  3:51 PM Dominique A wrote: Reason for CRM: Pt is calling to let her PCP nurse know she has an appt with [facility] [place] off [address](Dermatology) on 06/19/2023 and she has an appt with the leg vein clinic at the end of August.

## 2023-05-30 ENCOUNTER — Other Ambulatory Visit: Payer: Self-pay | Admitting: Nurse Practitioner

## 2023-05-30 ENCOUNTER — Telehealth: Payer: Self-pay | Admitting: Nurse Practitioner

## 2023-05-30 DIAGNOSIS — M5136 Other intervertebral disc degeneration, lumbar region: Secondary | ICD-10-CM

## 2023-05-30 MED ORDER — TRAMADOL HCL 50 MG PO TABS
50.0000 mg | ORAL_TABLET | Freq: Three times a day (TID) | ORAL | 0 refills | Status: AC | PRN
Start: 2023-05-30 — End: 2023-06-14

## 2023-05-30 NOTE — Telephone Encounter (Signed)
30 tramadol sent. I will not refill any additional pain medication in the future aside from gabapentin. She will need to make sure she keeps her appointment with pain management.

## 2023-05-30 NOTE — Telephone Encounter (Signed)
Medication Refill - Medication: albuterol (VENTOLIN HFA) 108 (90 Base) MCG/ACT inhaler   Has the patient contacted their pharmacy? No.   Preferred Pharmacy (with phone number or street name):  [id number] [place], [place] - [address] Phone: [phone]  Fax: [phone]      Has the patient been seen for an appointment in the last year OR does the patient have an upcoming appointment? Yes.    Please assist patient further as she is completely out at this time

## 2023-05-30 NOTE — Telephone Encounter (Signed)
The patient saw her provider last month and says she discussed getting a prescription for tramadol as her previous provider prescribed this for her and it really helped. She has been set up for pain management but it will not be until the end of the month and she says her back has really bothered her. She is hoping a prescription for tramadol could be called in to last her until her pain management appt at the end of the month. She uses  [id number] [place], [place] - [address] Phone: [phone]  Fax: [phone]     Please assist patient further

## 2023-05-30 NOTE — Telephone Encounter (Signed)
Encounter has been sent to PCP previously.

## 2023-05-31 MED ORDER — ALBUTEROL SULFATE HFA 108 (90 BASE) MCG/ACT IN AERS: 1.0000 | INHALATION_SPRAY | RESPIRATORY_TRACT | 0 refills | Status: DC | PRN

## 2023-05-31 NOTE — Telephone Encounter (Signed)
Requested medication (s) are due for refill today: yes  Requested medication (s) are on the active medication list: yes  Last refill:  03/23/23  Future visit scheduled: yes  Notes to clinic:  Unable to refill per protocol, last refill by another provider. Routing for review.     Requested Prescriptions  Pending Prescriptions Disp Refills   albuterol (VENTOLIN HFA) 108 (90 Base) MCG/ACT inhaler 18 g 0    Sig: Inhale 1-2 puffs into the lungs every 4 (four) hours as needed for wheezing or shortness of breath.     Pulmonology:  Beta Agonists 2 Passed - 05/30/2023  3:46 PM      Passed - Last BP in normal range    BP Readings from Last 1 Encounters:  05/02/23 124/71         Passed - Last Heart Rate in normal range    Pulse Readings from Last 1 Encounters:  05/02/23 94         Passed - Valid encounter within last 12 months    Recent Outpatient Visits           4 weeks ago Uncontrolled type 2 diabetes mellitus with hyperglycemia [place])   [place] [facility] [place], Shea Stakes, NP   4 months ago Chronic venous insufficiency   [place] [facility] [place], Shea Stakes, NP   4 months ago Primary hypertension   [place] [facility] [place], Shea Stakes, NP   7 months ago Chronic constipation   [facility] [place], [place], [place]   1 year ago Bronchitis   [place] [facility] [place], Shea Stakes, NP       Future Appointments             In 2 weeks Nahser, Deloris Ping, MD [facility]   In 2 months Claiborne Rigg, NP [facility]

## 2023-05-31 NOTE — Telephone Encounter (Signed)
Unable to reach patient by phone to relay results.  Voicemail left to return call for response.   

## 2023-06-05 ENCOUNTER — Ambulatory Visit: Payer: Self-pay | Admitting: *Deleted

## 2023-06-05 ENCOUNTER — Other Ambulatory Visit: Payer: Self-pay | Admitting: Nurse Practitioner

## 2023-06-05 DIAGNOSIS — R110 Nausea: Secondary | ICD-10-CM

## 2023-06-05 MED ORDER — ONDANSETRON 4 MG PO TBDP
4.0000 mg | ORAL_TABLET | Freq: Three times a day (TID) | ORAL | 0 refills | Status: DC | PRN
Start: 2023-06-05 — End: 2023-06-24

## 2023-06-05 NOTE — Telephone Encounter (Signed)
Sent 10 pills. Follow up with GI. No additional refills of any nausea medication unless ordered by GI

## 2023-06-05 NOTE — Telephone Encounter (Signed)
Summary: nausea   Pt called in asking for pills for nausea. She wants the kind you put under your tongue, didn't want an appt.. [id number] [place], [place] - [address] AT [facility] [address] Phone: [phone] Fax: [phone]         Chief Complaint: Nausea Symptoms: Nausea, no vomiting, lack of appetite "From heat." Frequency: Yesterday Pertinent Negatives: Patient denies vomiting, pain, fever Disposition: [] ED /[] Urgent Care (no appt availability in office) / [] Appointment(In office/virtual)/ []  [facility]/ [] Home Care/ [] Refused Recommended Disposition /[] [facility]/ [x]  Follow-up with PCP Additional Notes: Pt requesting refill of Zofran "The kind that melts under the tongue." Noted no refills until seen by GI. Pt has appt Friday 06/08/23 with GI, CT scan. Declines appt. Please advise.  Reason for Disposition  Unexplained nausea  Answer Assessment - Initial Assessment Questions 1. NAUSEA SEVERITY: "How bad is the nausea?" (e.g., mild, moderate, severe; dehydration, weight loss)   - MILD: loss of appetite without change in eating habits   - MODERATE: decreased oral intake without significant weight loss, dehydration, or malnutrition   - SEVERE: inadequate caloric or fluid intake, significant weight loss, symptoms of dehydration     Loss of appetite 2. ONSET: "When did the nausea begin?"     Yesterday 3. VOMITING: "Any vomiting?" If Yes, ask: "How many times today?"     No "Gagging" 4. RECURRENT SYMPTOM: "Have you had nausea before?" If Yes, ask: "When was the last time?" "What happened that time?"     1 month ago. 5. CAUSE: "What do you think is causing the nausea?"     "The heat."  Protocols used: Nausea-A-AH

## 2023-06-06 NOTE — Telephone Encounter (Signed)
Unable to reach patient by phone to relay results.  Voicemail left with response.

## 2023-06-08 ENCOUNTER — Ambulatory Visit
Admission: RE | Admit: 2023-06-08 | Discharge: 2023-06-08 | Disposition: A | Discharge status: Home / Self Care | Payer: Medicare HMO | Source: Ambulatory Visit | Attending: Nurse Practitioner | Admitting: Nurse Practitioner

## 2023-06-08 DIAGNOSIS — Z720 Tobacco use: Secondary | ICD-10-CM

## 2023-06-08 DIAGNOSIS — Z87891 Personal history of nicotine dependence: Secondary | ICD-10-CM | POA: Diagnosis not present

## 2023-06-13 ENCOUNTER — Telehealth: Payer: Self-pay | Admitting: Nurse Practitioner

## 2023-06-13 NOTE — Telephone Encounter (Signed)
Spoke with patient . Verified name & DOB    Advised patient that images are not available as of yet and she will received a call as soon as they are.

## 2023-06-13 NOTE — Telephone Encounter (Signed)
Pt called in about ct scan results on lungs . Please call back

## 2023-06-15 ENCOUNTER — Telehealth: Payer: Self-pay | Admitting: Nurse Practitioner

## 2023-06-15 ENCOUNTER — Encounter (HOSPITAL_COMMUNITY): Payer: Self-pay

## 2023-06-15 ENCOUNTER — Ambulatory Visit (HOSPITAL_COMMUNITY)
Admission: RE | Admit: 2023-06-15 | Discharge: 2023-06-15 | Disposition: A | Discharge status: Home / Self Care | Payer: Medicare HMO | Source: Ambulatory Visit

## 2023-06-15 VITALS — BP 133/69 | HR 97 | Temp 98.2°F | Resp 18

## 2023-06-15 DIAGNOSIS — K029 Dental caries, unspecified: Secondary | ICD-10-CM | POA: Diagnosis not present

## 2023-06-15 DIAGNOSIS — K047 Periapical abscess without sinus: Secondary | ICD-10-CM | POA: Diagnosis not present

## 2023-06-15 MED ORDER — CLINDAMYCIN HCL 150 MG PO CAPS: 450.0000 mg | ORAL_CAPSULE | Freq: Three times a day (TID) | ORAL | 0 refills | Status: AC

## 2023-06-15 MED ORDER — KETOROLAC TROMETHAMINE 30 MG/ML IJ SOLN
INTRAMUSCULAR | Status: AC
  Filled 2023-06-15: qty 1

## 2023-06-15 MED ORDER — LIDOCAINE VISCOUS HCL 2 % MT SOLN: 5.0000 mL | Freq: Four times a day (QID) | OROMUCOSAL | 0 refills | Status: AC | PRN

## 2023-06-15 MED ORDER — KETOROLAC TROMETHAMINE 30 MG/ML IJ SOLN
30.0000 mg | Freq: Once | INTRAMUSCULAR | Status: AC
  Administered 2023-06-15: 30 mg via INTRAMUSCULAR

## 2023-06-15 NOTE — Telephone Encounter (Signed)
Patient calling again to get her CT results. Can someone contact imaging to see when her results will be in and f/u with patient.

## 2023-06-15 NOTE — ED Triage Notes (Signed)
Pt c/o rt up tooth ache radiating to rt ear pain x1wk. States rubbed clove oil and OTC pain relief with no relief,

## 2023-06-15 NOTE — Telephone Encounter (Signed)
Call placed to patient unable to reach message left on VM.   

## 2023-06-15 NOTE — Discharge Instructions (Addendum)
Stop smoking. Take antibiotic as directed. Antibiotics can cause stomach upset, may eat yogurt to help with this. Follow up with dental provider of your choice-call for appt(resource handout given).   Go to ER for new or worsening issues or concerns.

## 2023-06-15 NOTE — ED Provider Notes (Signed)
[facility]    CSN: [id number] Arrival date & time: 06/15/23  1656      History   Chief Complaint Chief Complaint  Patient presents with   Dental Pain    HPI Mary Gray is a 62 y.o. female.   62 year old female pt, Mary Gray, presents to urgent care with chief complaint of right upper dental pain that started about 1 week.  Patient has history of poor dentition and rub clove oil and over-the-counter pain relief med with out relief of symptoms.  Patient now has mild swelling to right side of face.  +smoker  The history is provided by the patient. No language interpreter was used.    Past Medical History:  Diagnosis Date   Asthma    Back pain    Chronic interstitial cystitis    Followed by Dr. Marcine Matar   COPD (chronic obstructive pulmonary disease) (HCC)    DDD (degenerative disc disease), lumbar    Degeneration of cervical intervertebral disc    Depression    Diabetes mellitus, type 2 (HCC) 2007   Heme positive stool    EGD on 04/08/2010 by Dr. Ewing Schlein showed chronic gastritis and a few gastric polyps; pathology showed a fundic gland polyp.  Colonoscopy on 04/08/2010 showed small external and internal hemorrhoids, and a few benign-appearing diminutive polyps in the rectum, the distal sigmoid colon, and in the distal descending colon; pathology showed hyperplastic polyps.    Hypertension    Hypothyroidism    Iron deficiency anemia    Spinal stenosis     Patient Active Problem List   Diagnosis Date Noted   Pain due to dental caries 06/15/2023   Dental infection 06/15/2023   Acute respiratory failure with hypoxia (HCC)    COVID-19 virus infection 01/20/2022   Lumbar spondylosis 03/22/2021   Chronic radicular lumbar pain 03/22/2021   Bilateral primary osteoarthritis of knee 03/22/2021   Chronic pain syndrome 03/22/2021   Tobacco use disorder 03/22/2021   Hyponatremia 03/04/2021   Chronic bilateral low back pain without sciatica 12/13/2020    Sleep disturbance 12/13/2020   Vaginitis 10/27/2020   Right ear pain 10/13/2020   Nausea 02/10/2020   B12 deficiency 11/25/2018   Urinary, incontinence, stress female 09/09/2018   Primary osteoarthritis of right knee 05/01/2018   Chronic venous insufficiency 02/26/2017   Candidal skin infection 03/03/2014   Preventative health care 05/14/2013   Spinal stenosis, lumbar region, with neurogenic claudication 03/01/2011   Hypothyroidism 07/06/2006   Diabetes mellitus type 2, uncontrolled 07/06/2006   Hyperlipemia 07/06/2006   Generalized anxiety disorder 07/06/2006   Essential hypertension 07/06/2006   GERD 07/06/2006   Lumbar degenerative disc disease 07/06/2006    Past Surgical History:  Procedure Laterality Date   CARPAL TUNNEL RELEASE  05/08/2000   By Dr. Katy Fitch. Sypher, Montez Hageman.    OB History   No obstetric history on file.      Home Medications    Prior to Admission medications   Medication Sig Start Date End Date Taking? Authorizing Provider  clindamycin (CLEOCIN) 150 MG capsule Take 3 capsules (450 mg total) by mouth 3 (three) times daily for 7 days. 06/15/23 06/22/23 Yes Fern Asmar, Para March, NP  lidocaine (XYLOCAINE) 2 % solution Use as directed 5 mLs in the mouth or throat every 6 (six) hours as needed for up to 3 days for mouth pain. 06/15/23 06/18/23 Yes Shawndell Schillaci, Para March, NP  promethazine-dextromethorphan (PROMETHAZINE-DM) 6.25-15 MG/5ML syrup Take 5 mLs by mouth 4 (four) times  daily as needed for cough. 05/14/23   Claiborne Rigg, NP  Accu-Chek Softclix Lancets lancets Check blood sugar 1 time per day 07/12/20   Tyson Alias, MD  acetaminophen (TYLENOL) 500 MG tablet Take 1,000 mg by mouth every 6 (six) hours as needed for mild pain.    [provider]  albuterol (VENTOLIN HFA) 108 (90 Base) MCG/ACT inhaler Inhale 1-2 puffs into the lungs every 4 (four) hours as needed for wheezing or shortness of breath. 05/31/23   Hoy Register, MD  amLODipine  (NORVASC) 5 MG tablet Take 1 tablet (5 mg total) by mouth daily. 04/17/23   Hoy Register, MD  aspirin EC 81 MG tablet Take 81 mg by mouth daily. Swallow whole.    [provider]  [facility] VITAMIN B12 1000 MCG tablet TAKE 1 TABLET BY MOUTH EVERY DAY Patient taking differently: Take 1,000 mcg by mouth daily. 01/12/21   Tyson Alias, MD  cyclobenzaprine (FLEXERIL) 5 MG tablet TAKE 1 TABLET BY MOUTH EVERYDAY AT BEDTIME 05/22/23   Hoy Register, MD  dextromethorphan-guaiFENesin (TUSSIN DM) 10-100 MG/5ML liquid Take 10 mLs by mouth every 6 (six) hours as needed for cough. 05/11/23   Claiborne Rigg, NP  enalapril (VASOTEC) 20 MG tablet Take 1 tablet (20 mg total) by mouth daily. 03/30/23   Claiborne Rigg, NP  esomeprazole (NEXIUM) 40 MG capsule TAKE 1 CAPSULE (40 MG TOTAL) BY MOUTH DAILY. 03/29/23 09/25/23  Claiborne Rigg, NP  fluticasone-salmeterol [facility] INHUB) 250-50 MCG/ACT AEPB Inhale 1 puff into the lungs in the morning and at bedtime. 02/20/23   Claiborne Rigg, NP  gabapentin (NEURONTIN) 600 MG tablet TAKE 1 TABLET BY MOUTH THREE TIMES A DAY 04/25/23   Claiborne Rigg, NP  glucose blood (ACCU-CHEK GUIDE) test strip CHECK BLOOD SUGAR 1 TIME PER DAY 05/12/21   Tyson Alias, MD  ipratropium-albuterol (DUONEB) 0.5-2.5 (3) MG/3ML SOLN Take 3 mLs by nebulization every 4 (four) hours as needed. 03/23/23   Wallis Bamberg, PA-C  levocetirizine (XYZAL) 5 MG tablet Take 1 tablet (5 mg total) by mouth every evening. 03/23/23   Wallis Bamberg, PA-C  levothyroxine (SYNTHROID) 88 MCG tablet Take 1 tablet (88 mcg total) by mouth daily before breakfast. 01/03/23   Claiborne Rigg, NP  meloxicam (MOBIC) 7.5 MG tablet TAKE 1 TABLET BY MOUTH EVERY DAY 05/29/23   Hoy Register, MD  metFORMIN (GLUCOPHAGE-XR) 500 MG 24 hr tablet TAKE 2 TABLETS BY MOUTH TWICE A DAY 04/17/23   Claiborne Rigg, NP  Misc. Devices MISC Provide patient with shower chair and grab bar. ICD10 M17.10 M54.16 03/03/23   Claiborne Rigg, NP  multivitamin-iron-minerals-folic acid (CENTRUM) chewable tablet Chew 1 tablet by mouth daily. 07/12/20   Tyson Alias, MD  naloxegol oxalate (MOVANTIK) 25 MG TABS tablet Take 1 tablet (25 mg total) by mouth daily. 01/03/23   Claiborne Rigg, NP  nystatin (NYSTATIN) powder Apply 1 application topically 3 (three) times daily. 03/10/21   [name], MD  nystatin cream (MYCOSTATIN) Apply 1 Application topically 2 (two) times daily. 01/03/23   Claiborne Rigg, NP  ondansetron (ZOFRAN-ODT) 4 MG disintegrating tablet Take 1 tablet (4 mg total) by mouth every 8 (eight) hours as needed for nausea or vomiting. 06/05/23   Claiborne Rigg, NP  PREMARIN vaginal cream PLACE 1 APPLICATORFUL VAGINALLY DAILY. Patient taking differently: 1 applicator 2 (two) times a week. 11/23/21   Tyson Alias, MD  rosuvastatin (CRESTOR) 20 MG  tablet Take 1 tablet (20 mg total) by mouth daily. 10/04/22   Anders Simmonds, PA-C  Tiotropium Bromide Monohydrate (SPIRIVA RESPIMAT) 2.5 MCG/ACT AERS Inhale 2 puffs into the lungs daily. 12/30/22   Allwardt, Crist Infante, PA-C  triamcinolone (KENALOG) 0.025 % ointment APPLY TO AFFECTED AREA TWICE A DAY 05/22/23   Hoy Register, MD  zolpidem (AMBIEN) 5 MG tablet TAKE 1 TABLET BY MOUTH EVERYDAY AT BEDTIME 05/22/23   Claiborne Rigg, NP    Family History Family History  Problem Relation Age of Onset   Heart attack Father 72   Diabetes Mother    Hypertension Mother    Diabetes Sister    Breast cancer Neg Hx    Colon cancer Neg Hx     Social History Social History   Tobacco Use   Smoking status: Some Days    Current packs/day: 0.50    Average packs/day: 0.5 packs/day for 45.0 years (22.5 ttl pk-yrs)    Types: Cigarettes   Smokeless tobacco: Never   Tobacco comments:    1/2 pack per day  Vaping Use   Vaping status: Never Used  Substance Use Topics   Alcohol use: Yes    Alcohol/week: 1.0 standard drink of alcohol    Types: 1 Cans of beer per week     Comment: Occ   Drug use: No     Allergies   Amoxicillin, Codeine sulfate, Meperidine and related, Minocycline hcl, Penicillins, Propoxyphene n-acetaminophen, and Tetracycline hcl   Review of Systems Review of Systems  Constitutional:  Negative for fever.  HENT:  Positive for dental problem and facial swelling. Negative for drooling.   All other systems reviewed and are negative.    Physical Exam Triage Vital Signs ED Triage Vitals  Encounter Vitals Group     BP      Systolic BP Percentile      Diastolic BP Percentile      Pulse      Resp      Temp      Temp src      SpO2      Weight      Height      Head Circumference      Peak Flow      Pain Score      Pain Loc      Pain Education      Exclude from Growth Chart    No data found.  Updated Vital Signs BP 133/69 (BP Location: Right Arm)   Pulse 97   Temp 98.2 F (36.8 C) (Oral)   Resp 18   LMP 01/17/2009   SpO2 100%   Visual Acuity Right Eye Distance:   Left Eye Distance:   Bilateral Distance:    Right Eye Near:   Left Eye Near:    Bilateral Near:     Physical Exam Vitals and nursing note reviewed.  Constitutional:      Appearance: She is well-developed and well-groomed.  HENT:     Head: Normocephalic.     Jaw: No trismus.     Mouth/Throat:     Dentition: Abnormal dentition. Dental tenderness, gingival swelling and dental caries present. No dental abscesses.      Comments: Fractured teeth,caries,no fluctuance,no tenting Cardiovascular:     Rate and Rhythm: Normal rate.  Neurological:     General: No focal deficit present.     Mental Status: She is alert and oriented to person, place, and time.     GCS: GCS  eye subscore is 4. GCS verbal subscore is 5. GCS motor subscore is 6.  Psychiatric:        Attention and Perception: Attention normal.        Mood and Affect: Mood normal.        Speech: Speech normal.        Behavior: Behavior normal. Behavior is cooperative.      UC Treatments /  Results  Labs (all labs ordered are listed, but only abnormal results are displayed) Labs Reviewed - No data to display  EKG   Radiology No results found.  Procedures Procedures (including critical care time)  Medications Ordered in UC Medications  ketorolac (TORADOL) 30 MG/ML injection 30 mg (30 mg Intramuscular Given 06/15/23 1822)    Initial Impression / Assessment and Plan / UC Course  I have reviewed the triage vital signs and the nursing notes.  Pertinent labs & imaging results that were available during my care of the patient were reviewed by me and considered in my medical decision making (see chart for details).  Clinical Course as of 06/15/23 1949  Caleen Essex Jun 15, 2023  1819 Toradol 30 mg IM for pain [JD]    Clinical Course User Index [JD] Sande Pickert, Para March, NP    Ddx: Dental pain due to caries, dental infection, smoker Final Clinical Impressions(s) / UC Diagnoses   Final diagnoses:  Pain due to dental caries  Dental infection     Discharge Instructions      Stop smoking. Take antibiotic as directed. Antibiotics can cause stomach upset, may eat yogurt to help with this. Follow up with dental provider of your choice-call for appt(resource handout given).   Go to ER for new or worsening issues or concerns.     ED Prescriptions     Medication Sig Dispense Auth. Provider   clindamycin (CLEOCIN) 150 MG capsule Take 3 capsules (450 mg total) by mouth 3 (three) times daily for 7 days. 63 capsule Jazzlyn Huizenga, NP   lidocaine (XYLOCAINE) 2 % solution Use as directed 5 mLs in the mouth or throat every 6 (six) hours as needed for up to 3 days for mouth pain. 60 mL Alford Gamero, Para March, NP      PDMP not reviewed this encounter.   Clancy Gourd, NP 06/15/23 1949

## 2023-06-18 ENCOUNTER — Other Ambulatory Visit: Payer: Self-pay | Admitting: Nurse Practitioner

## 2023-06-18 DIAGNOSIS — K5909 Other constipation: Secondary | ICD-10-CM

## 2023-06-18 NOTE — Telephone Encounter (Signed)
Call placed to patient unable to reach message left on VM.   

## 2023-06-18 NOTE — Telephone Encounter (Signed)
Patient had an appointment on 08/06 with porvider

## 2023-06-19 ENCOUNTER — Encounter: Payer: Self-pay | Admitting: Cardiovascular Disease

## 2023-06-19 ENCOUNTER — Ambulatory Visit: Payer: Medicare HMO | Attending: Internal Medicine | Admitting: Cardiovascular Disease

## 2023-06-19 VITALS — BP 100/58 | HR 59 | Ht 61.0 in | Wt 204.8 lb

## 2023-06-19 DIAGNOSIS — F17200 Nicotine dependence, unspecified, uncomplicated: Secondary | ICD-10-CM

## 2023-06-19 DIAGNOSIS — R0600 Dyspnea, unspecified: Secondary | ICD-10-CM | POA: Diagnosis not present

## 2023-06-19 DIAGNOSIS — M5136 Other intervertebral disc degeneration, lumbar region: Secondary | ICD-10-CM | POA: Diagnosis not present

## 2023-06-19 DIAGNOSIS — Z79899 Other long term (current) drug therapy: Secondary | ICD-10-CM | POA: Diagnosis not present

## 2023-06-19 MED ORDER — AMLODIPINE BESYLATE 2.5 MG PO TABS: 2.5000 mg | ORAL_TABLET | Freq: Every day | ORAL | 3 refills | Status: DC

## 2023-06-19 NOTE — Progress Notes (Unsigned)
  Cardiology Office Note:  .   Date:  06/19/2023  ID:  Mary Gray, DOB Sep 09, 1961, MRN [id number] PCP: Claiborne Rigg, NP  [facility] Providers Cardiologist:  None { Click to update primary MD,subspecialty MD or APP then REFRESH:1}   History of Present Illness: .   Mary Gray is a 62 y.o. female  with HTN, smoking  She was found to have an abnormal ECG when she was told that she has an abnormal ECG  She is actually not sure why she is being seen today   No CP  Has some chest discomfort when she gets upset    Has DOE - ie walking across our parking lot    Smokes - 1/2 - 1 ppd   She has routine cancer screening CT scans.  Her most recent cancer screening CT scan from July 28 revealed some atherosclerosis in the aorta.  Her cholesterol levels are well-controlled.  Her last LDL is 39, total cholesterol is 111, HDL is 39, triglyceride level is 205     ROS: ***  Studies Reviewed: .        *** Risk Assessment/Calculations:   {Does this patient have ATRIAL FIBRILLATION?:[phone]}         Physical Exam:   VS:  BP (!) 100/58   Pulse (!) 59   Ht 5\' 1"  (1.549 m)   Wt 204 lb 12.8 oz (92.9 kg)   LMP 01/17/2009   SpO2 96%   BMI 38.70 kg/m    Wt Readings from Last 3 Encounters:  06/19/23 204 lb 12.8 oz (92.9 kg)  05/02/23 187 lb 3.2 oz (84.9 kg)  01/31/23 180 lb 8.9 oz (81.9 kg)    GEN: Well nourished, well developed in no acute distress NECK: No JVD; No carotid bruits CARDIAC: ***RRR, no murmurs, rubs, gallops RESPIRATORY:  Clear to auscultation without rales, wheezing or rhonchi  ABDOMEN: Soft, non-tender, non-distended EXTREMITIES:  No edema; No deformity   ASSESSMENT AND PLAN: .   ***    {Are you ordering a CV Procedure (e.g. stress test, cath, DCCV, TEE, etc)?   Press F2        :[id number]}  Dispo: ***  Signed, Mary Miss, MD

## 2023-06-19 NOTE — Telephone Encounter (Signed)
Requested Prescriptions  Pending Prescriptions Disp Refills   naloxegol oxalate (MOVANTIK) 25 MG TABS tablet [Pharmacy Med Name: MOVANTIK 25 MG TABLET] 90 tablet 0    Sig: TAKE 1 TABLET (25 MG TOTAL) BY MOUTH DAILY.     Gastroenterology: Opioid-Induced Constipation - naloxegol Passed - 06/18/2023  2:38 AM      Passed - Cr in normal range and within 360 days    Creat  Date Value Ref Range Status  02/09/2015 0.55 0.50 - 1.10 mg/dL Final   Creatinine, Ser  Date Value Ref Range Status  05/02/2023 0.62 0.57 - 1.00 mg/dL Final   Creatinine,U  Date Value Ref Range Status  04/29/2010 71.9 mg/dL Final   Creatinine, Urine  Date Value Ref Range Status  03/22/2015 27.33 >20.0 mg/dL Final    Comment:    * (PPS) Presumptive positive screen result to be verified by         quantitative LC/MS or GC/MS confirmation testing.          Passed - Valid encounter within last 12 months    Recent Outpatient Visits           1 month ago Uncontrolled type 2 diabetes mellitus with hyperglycemia [facility])   [place] [facility] [place], [place], NP   4 months ago Chronic venous insufficiency   [place] [facility] [place], Shea Stakes, NP   5 months ago Primary hypertension   [place] [facility] [place], Shea Stakes, NP   8 months ago Chronic constipation   [facility] [place], Marzella Schlein, [place]   1 year ago Bronchitis   [facility] Claiborne Rigg, NP       Future Appointments             In 1 week Storm Frisk, MD [facility]   In 1 month Claiborne Rigg, NP [facility]

## 2023-06-19 NOTE — Patient Instructions (Signed)
Medication Instructions:  DECREASE Amlodipine to 2.5mg  daily *If you need a refill on your cardiac medications before your next appointment, please call your pharmacy*  Lab Work: NONE If you have labs (blood work) drawn today and your tests are completely normal, you will receive your results only by: MyChart Message (if you have MyChart) OR A paper copy in the mail If you have any lab test that is abnormal or we need to change your treatment, we will call you to review the results.  Testing/Procedures: ECHO Your physician has requested that you have an echocardiogram. Echocardiography is a painless test that uses sound waves to create images of your heart. It provides your doctor with information about the size and shape of your heart and how well your heart's chambers and valves are working. This procedure takes approximately one hour. There are no restrictions for this procedure. Please do NOT wear cologne, perfume, aftershave, or lotions (deodorant is allowed). Please arrive 15 minutes prior to your appointment time.  Follow-Up: At [facility], you and your health needs are our priority.  As part of our continuing mission to provide you with exceptional heart care, we have created designated Provider Care Teams.  These Care Teams include your primary Cardiologist (physician) and Advanced Practice Providers (APPs -  Physician Assistants and Nurse Practitioners) who all work together to provide you with the care you need, when you need it.  Your next appointment:   As Needed  Provider:   Kristeen Miss, MD

## 2023-06-20 ENCOUNTER — Encounter: Payer: Self-pay | Admitting: Cardiovascular Disease

## 2023-06-22 ENCOUNTER — Ambulatory Visit: Payer: Medicare HMO | Admitting: Nurse Practitioner

## 2023-06-22 ENCOUNTER — Ambulatory Visit: Payer: Self-pay

## 2023-06-22 NOTE — Telephone Encounter (Signed)
Chief Complaint: Nausea Symptoms: nauseous  Frequency: ongoing since Monday of this week. Pertinent Negatives: Patient denies Vomiting, stomach pain, weight loss  Disposition: [] ED /[] Urgent Care (no appt availability in office) / [] Appointment(In office/virtual)/ []  [facility]/ [] Home Care/ [] Refused Recommended Disposition /[] [facility]/ [x]  Follow-up with PCP Additional Notes: Patient states she was since at [facility] last week and was prescribed antibiotics. She has about 3 days left of Clindamycin 150mg   that she is taking 3 times a day. She noticed that she has been feeling nauseous since Monday. Patient states she is unsure of the antibiotic or the heat is causing her symptoms. Patient is requesting that a prescription can be sent to her pharmacy to treat the nauseous.  [facility] [address]. Advised patient that I would forward request to provider for additional recommendations.  Summary: Nausea symptomps   Patient called and stated she has been feeling very nauseous and wanted to know what to do for this or if medication could be called in for a medication you put under your tongue.  Patients callback # [phone]     Reason for Disposition  Taking prescription medication that could cause nausea (e.g., narcotics/opiates, antibiotics, OCPs, many others)  Answer Assessment - Initial Assessment Questions 1. NAUSEA SEVERITY: "How bad is the nausea?" (e.g., mild, moderate, severe; dehydration, weight loss)   - MILD: loss of appetite without change in eating habits   - MODERATE: decreased oral intake without significant weight loss, dehydration, or malnutrition   - SEVERE: inadequate caloric or fluid intake, significant weight loss, symptoms of dehydration     Moderate  2. ONSET: "When did the nausea begin?"     This week on Monday 3. VOMITING: "Any vomiting?" If Yes, ask: "How many times today?"     No  4. RECURRENT SYMPTOM:  "Have you had nausea before?" If Yes, ask: "When was the last time?" "What happened that time?"     No 5. CAUSE: "What do you think is causing the nausea?"     Being outside in the heat  Protocols used: [facility]

## 2023-06-24 ENCOUNTER — Other Ambulatory Visit: Payer: Self-pay | Admitting: Nurse Practitioner

## 2023-06-24 DIAGNOSIS — R110 Nausea: Secondary | ICD-10-CM

## 2023-06-24 MED ORDER — ONDANSETRON 4 MG PO TBDP
4.0000 mg | ORAL_TABLET | Freq: Three times a day (TID) | ORAL | 0 refills | Status: DC | PRN
Start: 2023-06-24 — End: 2023-07-04

## 2023-06-24 NOTE — Progress Notes (Deleted)
   Established Patient Office Visit  Subjective   Patient ID: Mary Gray, female    DOB: September 25, 1961  Age: 62 y.o. MRN: [id number]  No chief complaint on file.   62 y.o.F pcp [place]      {History (Optional):23778}  ROS    Objective:     LMP 01/17/2009  {Vitals History (Optional):23777}  Physical Exam   No results found for any visits on 06/26/23.  {Labs (Optional):23779}  The ASCVD Risk score (Arnett DK, et al., 2019) failed to calculate for the following reasons:   The valid total cholesterol range is 130 to 320 mg/dL    Assessment & Plan:   Problem List Items Addressed This Visit   None   No follow-ups on file.    Shan Levans, MD

## 2023-06-24 NOTE — Telephone Encounter (Signed)
Will send zofran. She can not miss her appt in September with GI as I will no longer send zofran after September. It is very important she follows up with the stomach doctor to determine the cause of her nausea.

## 2023-06-25 ENCOUNTER — Other Ambulatory Visit: Payer: Self-pay | Admitting: Family Medicine

## 2023-06-25 ENCOUNTER — Institutional Professional Consult (permissible substitution): Payer: Medicare HMO | Admitting: Pulmonary Disease

## 2023-06-25 DIAGNOSIS — M47816 Spondylosis without myelopathy or radiculopathy, lumbar region: Secondary | ICD-10-CM

## 2023-06-25 DIAGNOSIS — G2581 Restless legs syndrome: Secondary | ICD-10-CM

## 2023-06-25 DIAGNOSIS — G894 Chronic pain syndrome: Secondary | ICD-10-CM

## 2023-06-25 DIAGNOSIS — M5136 Other intervertebral disc degeneration, lumbar region: Secondary | ICD-10-CM

## 2023-06-25 NOTE — Telephone Encounter (Signed)
Patient identified by name and date of birth.   Patient aware of response and voiced understanding.   

## 2023-06-25 NOTE — Telephone Encounter (Signed)
Return call to patient unanswered.

## 2023-06-26 ENCOUNTER — Ambulatory Visit: Payer: Medicare HMO | Admitting: Critical Care Medicine

## 2023-06-26 ENCOUNTER — Encounter (HOSPITAL_COMMUNITY): Payer: Self-pay

## 2023-06-26 ENCOUNTER — Telehealth: Payer: Self-pay

## 2023-06-26 ENCOUNTER — Ambulatory Visit (HOSPITAL_COMMUNITY): Payer: Medicare HMO | Attending: Nurse Practitioner

## 2023-06-26 ENCOUNTER — Encounter (HOSPITAL_COMMUNITY): Payer: Self-pay | Admitting: Cardiovascular Disease

## 2023-06-26 DIAGNOSIS — M5136 Other intervertebral disc degeneration, lumbar region: Secondary | ICD-10-CM | POA: Diagnosis not present

## 2023-06-26 NOTE — Telephone Encounter (Signed)
Copied from CRM [id number]. Topic: General - Other >> Jun 26, 2023 11:56 AM Phill Myron wrote: Can you please send my MRI imaging to [facility]  Dr Carmel [place] would like to view her imaging and notes.

## 2023-06-26 NOTE — Telephone Encounter (Signed)
Patient identified by name and date of birth.  Patient is aware of imaging needing to come from ortho or she needs to sign a records release from [facility] to ortho

## 2023-06-29 ENCOUNTER — Ambulatory Visit: Payer: Medicare HMO | Admitting: Critical Care Medicine

## 2023-07-03 ENCOUNTER — Telehealth: Payer: Self-pay

## 2023-07-03 NOTE — Telephone Encounter (Signed)
She will need to see angela for this next week (8-19). I do not recall evaluating her for this.

## 2023-07-03 NOTE — Telephone Encounter (Signed)
Copied from CRM [id number]. Topic: General - Other >> Jul 02, 2023  4:00 PM Santiya F wrote: Referral Request - Has patient seen PCP for this complaint? Yes.   *If NO, is insurance requiring patient see PCP for this issue before PCP can refer them? Referral for which specialty: Dermatology  Preferred provider/office: Dermatologist on [facility] Reason for referral: Knot on back

## 2023-07-04 ENCOUNTER — Telehealth: Payer: Self-pay | Admitting: Nurse Practitioner

## 2023-07-04 ENCOUNTER — Other Ambulatory Visit: Payer: Self-pay | Admitting: Nurse Practitioner

## 2023-07-04 DIAGNOSIS — R110 Nausea: Secondary | ICD-10-CM

## 2023-07-04 DIAGNOSIS — I10 Essential (primary) hypertension: Secondary | ICD-10-CM

## 2023-07-04 NOTE — Telephone Encounter (Signed)
Return call to patient unanswered.

## 2023-07-04 NOTE — Telephone Encounter (Signed)
Medication Refill - Medication: enalapril (VASOTEC) 20 MG tablet   Has the patient contacted their pharmacy? Yes.     Preferred Pharmacy (with phone number or street name):  [id number] [place], [place] - [address] Phone: [phone]  Fax: [phone]    Has the patient been seen for an appointment in the last year OR does the patient have an upcoming appointment? Yes.    Please assist patient further as she is completely out of her medication at this time. She does have an appt with her provider on 8/19

## 2023-07-05 ENCOUNTER — Ambulatory Visit: Payer: Self-pay

## 2023-07-05 MED ORDER — ENALAPRIL MALEATE 20 MG PO TABS
20.0000 mg | ORAL_TABLET | Freq: Every day | ORAL | 0 refills | Status: DC
Start: 2023-07-05 — End: 2023-09-05

## 2023-07-05 NOTE — Telephone Encounter (Signed)
Requested Prescriptions  Pending Prescriptions Disp Refills   enalapril (VASOTEC) 20 MG tablet 90 tablet 0    Sig: Take 1 tablet (20 mg total) by mouth daily.     Cardiovascular:  ACE Inhibitors Passed - 07/04/2023 10:09 AM      Passed - Cr in normal range and within 180 days    Creat  Date Value Ref Range Status  02/09/2015 0.55 0.50 - 1.10 mg/dL Final   Creatinine, Ser  Date Value Ref Range Status  05/02/2023 0.62 0.57 - 1.00 mg/dL Final   Creatinine,U  Date Value Ref Range Status  04/29/2010 71.9 mg/dL Final   Creatinine, Urine  Date Value Ref Range Status  03/22/2015 27.33 >20.0 mg/dL Final    Comment:    * (PPS) Presumptive positive screen result to be verified by         quantitative LC/MS or GC/MS confirmation testing.          Passed - K in normal range and within 180 days    Potassium  Date Value Ref Range Status  05/02/2023 4.1 3.5 - 5.2 mmol/L Final         Passed - Patient is not pregnant      Passed - Last BP in normal range    BP Readings from Last 1 Encounters:  06/19/23 (!) 100/58         Passed - Valid encounter within last 6 months    Recent Outpatient Visits           2 months ago Uncontrolled type 2 diabetes mellitus with hyperglycemia [facility])   [place] [facility] [place], Shea Stakes, NP   5 months ago Chronic venous insufficiency   [place] [facility] [place], Shea Stakes, NP   6 months ago Primary hypertension   [place] [facility] [place], Shea Stakes, NP   9 months ago Chronic constipation   [facility] [place], [place], [place]   1 year ago Bronchitis   [place] [facility] [place], Shea Stakes, NP       Future Appointments             In 4 days [place], Marzella Schlein, PA-C [facility]   In 4 weeks Claiborne Rigg, NP [facility]

## 2023-07-05 NOTE — Telephone Encounter (Signed)
Chief Complaint: Nausea  Symptoms: nausea, headache, cough Frequency: comes and goes  Pertinent Negatives: Patient denies chest pain,  SOB Disposition: [] ED /[] Urgent Care (no appt availability in office) / [] Appointment(In office/virtual)/ []  [facility]/ [] Home Care/ [x] Refused Recommended Disposition /[] [facility]/ []  Follow-up with PCP Additional Notes: Patient states she has been out of her blood pressure medication for 7 days and has requested a refill. Patient also stated that she is feeling sick with nausea, headache, and cough. The nausea is the worse of the symptoms. Offered patient an appointment for symptoms and patient declined at this time stating she just wants to get her blood pressure medication filled. Care advice given and informed patient I would forward refill request to provider again. Advised if symptoms get worse to call the office back. Patient verbalized understanding.  Summary: out of medication/feeling sick   Pt states that she has been out of her medication for 7 days and she is starting to feel sick. Please advise.  enalapril (VASOTEC) 20 MG tablet    [id number] [place], [place] - [address] [place] [address] Phone: [phone]  Fax: [phone]       Reason for Disposition  Nausea is a chronic symptom (recurrent or ongoing AND present > 4 weeks)  Answer Assessment - Initial Assessment Questions 1. NAUSEA SEVERITY: "How bad is the nausea?" (e.g., mild, moderate, severe; dehydration, weight loss)   - MILD: loss of appetite without change in eating habits   - MODERATE: decreased oral intake without significant weight loss, dehydration, or malnutrition   - SEVERE: inadequate caloric or fluid intake, significant weight loss, symptoms of dehydration     Severe 2. ONSET: "When did the nausea begin?"     Sunday evening  3. VOMITING: "Any vomiting?" If Yes, ask: "How  many times today?"     None today  4. RECURRENT SYMPTOM: "Have you had nausea before?" If Yes, ask: "When was the last time?" "What happened that time?"     No in a long time  5. CAUSE: "What do you think is causing the nausea?"     I think I'm sick with the flu or something  Protocols used: Nausea-A-AH

## 2023-07-06 NOTE — Telephone Encounter (Addendum)
Call placed to patient unable to reach unable to leave message  Call to advise VASOTEC was sent to her pharmacy yesterday. At to remind her that she has an appointment on 07/09/2023

## 2023-07-06 NOTE — Telephone Encounter (Signed)
Unable to reach patient by phone to relay response x2.

## 2023-07-09 ENCOUNTER — Ambulatory Visit: Payer: Medicare HMO | Admitting: Physician Assistant

## 2023-07-09 VITALS — BP 115/74 | HR 84 | Wt 189.4 lb

## 2023-07-09 DIAGNOSIS — R059 Cough, unspecified: Secondary | ICD-10-CM

## 2023-07-09 DIAGNOSIS — Z7984 Long term (current) use of oral hypoglycemic drugs: Secondary | ICD-10-CM

## 2023-07-09 DIAGNOSIS — R110 Nausea: Secondary | ICD-10-CM | POA: Diagnosis not present

## 2023-07-09 DIAGNOSIS — E1165 Type 2 diabetes mellitus with hyperglycemia: Secondary | ICD-10-CM | POA: Diagnosis not present

## 2023-07-09 LAB — GLUCOSE, POCT (MANUAL RESULT ENTRY): POC Glucose: 132 mg/dl — AB (ref 70–99)

## 2023-07-09 MED ORDER — ONDANSETRON 4 MG PO TBDP
4.0000 mg | ORAL_TABLET | Freq: Three times a day (TID) | ORAL | 1 refills | Status: DC | PRN
Start: 2023-07-09 — End: 2023-08-06

## 2023-07-09 NOTE — Patient Instructions (Signed)
Nausea, Adult Nausea is feeling like you may vomit. Feeling like you may vomit is usually not serious, but it may be an early sign of a more serious medical problem. Vomiting is when stomach contents forcefully come out of your mouth. If you vomit, or if you are not able to drink enough fluids, you may not have enough water in your body (get dehydrated). If you do not have enough water in your body, you may: Feel tired. Feel thirsty. Have a dry mouth. Have cracked lips. Pee (urinate) less often. Older adults and people who have other diseases or a weak body defense system (immune system) have a higher risk of not having enough water in the body. The main goals of treating this condition are: To relieve your nausea. To ensure your nausea occurs less often. To prevent vomiting and losing too much fluid. Follow these instructions at home: Watch your symptoms for any changes. Tell your doctor about them. Eating and drinking     Take an ORS (oral rehydration solution). This is a drink that is sold at pharmacies and stores. Drink clear fluids in small amounts as you are able. These include: Water. Ice chips. Fruit juice that has water added (diluted fruit juice). Low-calorie sports drinks. Eat bland, easy-to-digest foods in small amounts as you are able, such as: Bananas. Applesauce. Rice. Low-fat (lean) meats. Toast. Crackers. Avoid drinking fluids that have a lot of sugar or caffeine in them. This includes energy drinks, sports drinks, and soda. Avoid alcohol. Avoid spicy or fatty foods. General instructions Take over-the-counter and prescription medicines only as told by your doctor. Rest at home while you get better. Drink enough fluid to keep your pee (urine) pale yellow. Take slow and deep breaths when you feel like you may vomit. Avoid food or things that have strong smells. Wash your hands often with soap and water for at least 20 seconds. If you cannot use soap and water,  use hand sanitizer. Make sure that everyone in your home washes their hands well and often. Keep all follow-up visits. Contact a doctor if: You feel worse. You feel like you may vomit and this lasts for more than 2 days. You vomit. You are not able to drink fluids without vomiting. You have new symptoms. You have a fever. You have a headache. You have muscle cramps. You have a rash. You have pain while peeing. You feel light-headed or dizzy. Get help right away if: You have pain in your chest, neck, arm, or jaw. You feel very weak or you faint. You have vomit that is bright red or looks like coffee grounds. You have bloody or black poop (stools) or poop that looks like tar. You have a very bad headache, a stiff neck, or both. You have very bad pain, cramping, or bloating in your belly (abdomen). You have trouble breathing or you are breathing very quickly. Your heart is beating very quickly. Your skin feels cold and clammy. You feel confused. You have signs of losing too much water in your body, such as: Dark pee, very little pee, or no pee. Cracked lips. Dry mouth. Sunken eyes. Sleepiness. Weakness. These symptoms may be an emergency. Get help right away. Call 911. Do not wait to see if the symptoms will go away. Do not drive yourself to the hospital. Summary Nausea is feeling like you are about vomit. If you vomit, or if you are not able to drink enough fluids, you may not have enough water in   your body (get dehydrated). Eat and drink what your doctor tells you. Take over-the-counter and prescription medicines only as told by your doctor. Contact a doctor right away if your symptoms get worse or you have new symptoms. Keep all follow-up visits. This information is not intended to replace advice given to you by your health care provider. Make sure you discuss any questions you have with your health care provider. Document Revised: 05/13/2021 Document Reviewed:  05/13/2021 Elsevier Patient Education  2024 Elsevier Inc.  

## 2023-07-09 NOTE — Progress Notes (Signed)
Patient ID: Mary Gray, female   DOB: 1961-06-25, 62 y.o.   MRN: [id number]     Mary Gray, is a 62 y.o. female  [id number]  [id number]  DOB - Mar 07, 1961  Chief Complaint  Patient presents with   Referral   Cough       Subjective:   Mary Gray is a 62 y.o. female here today for a Nausea for about 1 week.  No vomiting.  +HA.  Lack of taste or smell.  Some cough and runny nose.  Has not checked temp.  Boyfriend had symptoms first.  Feels tired.  Trying to drink a lot of water.   No problems updated.  ALLERGIES: Allergies  Allergen Reactions   Amoxicillin Other (See Comments)   Codeine Sulfate Other (See Comments)    Confusion, anxiety, fatigue   Meperidine And Related Nausea And Vomiting   Minocycline Hcl Nausea And Vomiting    Hospitalized for vomiting along with allergic reaction   Penicillins Other (See Comments)    Has patient had a PCN reaction causing immediate rash, facial/tongue/throat swelling, SOB or lightheadedness with hypotension: Yes Has patient had a PCN reaction causing severe rash involving mucus membranes or skin necrosis: Yes Has patient had a PCN reaction that required hospitalization Yes Has patient had a PCN reaction occurring within the last 10 years: Yes If all of the above answers are "NO", then may proceed with Cephalosporin use.    Propoxyphene N-Acetaminophen Other (See Comments)    confused   Tetracycline Hcl Other (See Comments)    Vomiting Note: Patient took a course of doxycycline in June 2014 without any problems.    PAST MEDICAL HISTORY: Past Medical History:  Diagnosis Date   Asthma    Back pain    Chronic interstitial cystitis    Followed by Dr. Marcine Matar   COPD (chronic obstructive pulmonary disease) (HCC)    DDD (degenerative disc disease), lumbar    Degeneration of cervical intervertebral disc    Depression    Diabetes mellitus, type 2 (HCC) 2007   Heme positive stool    EGD on 04/08/2010 by Dr.  Ewing Schlein showed chronic gastritis and a few gastric polyps; pathology showed a fundic gland polyp.  Colonoscopy on 04/08/2010 showed small external and internal hemorrhoids, and a few benign-appearing diminutive polyps in the rectum, the distal sigmoid colon, and in the distal descending colon; pathology showed hyperplastic polyps.    Hypertension    Hypothyroidism    Iron deficiency anemia    Spinal stenosis     MEDICATIONS AT HOME: Prior to Admission medications   Medication Sig Start Date End Date Taking? Authorizing Provider  Accu-Chek Softclix Lancets lancets Check blood sugar 1 time per day 07/12/20   Tyson Alias, MD  acetaminophen (TYLENOL) 500 MG tablet Take 1,000 mg by mouth every 6 (six) hours as needed for mild pain.    [provider]  albuterol (VENTOLIN HFA) 108 (90 Base) MCG/ACT inhaler Inhale 1-2 puffs into the lungs every 4 (four) hours as needed for wheezing or shortness of breath. 05/31/23   Hoy Register, MD  amLODipine (NORVASC) 2.5 MG tablet Take 1 tablet (2.5 mg total) by mouth daily. 06/19/23   Nahser, Deloris Ping, MD  aspirin EC 81 MG tablet Take 81 mg by mouth daily. Swallow whole.    [provider]  [facility] VITAMIN B12 1000 MCG tablet TAKE 1 TABLET BY MOUTH EVERY DAY Patient taking differently: Take 1,000 mcg by mouth daily. 01/12/21  Tyson Alias, MD  cyclobenzaprine (FLEXERIL) 5 MG tablet TAKE 1 TABLET BY MOUTH EVERYDAY AT BEDTIME 06/25/23   Hoy Register, MD  dextromethorphan-guaiFENesin (TUSSIN DM) 10-100 MG/5ML liquid Take 10 mLs by mouth every 6 (six) hours as needed for cough. 05/11/23   Claiborne Rigg, NP  enalapril (VASOTEC) 20 MG tablet Take 1 tablet (20 mg total) by mouth daily. 07/05/23   Claiborne Rigg, NP  esomeprazole (NEXIUM) 40 MG capsule TAKE 1 CAPSULE (40 MG TOTAL) BY MOUTH DAILY. 03/29/23 09/25/23  Claiborne Rigg, NP  fluticasone-salmeterol [facility] INHUB) 250-50 MCG/ACT AEPB Inhale 1 puff into the lungs in the morning  and at bedtime. 02/20/23   Claiborne Rigg, NP  gabapentin (NEURONTIN) 600 MG tablet TAKE 1 TABLET BY MOUTH THREE TIMES A DAY 04/25/23   Claiborne Rigg, NP  glucose blood (ACCU-CHEK GUIDE) test strip CHECK BLOOD SUGAR 1 TIME PER DAY 05/12/21   Tyson Alias, MD  ipratropium-albuterol (DUONEB) 0.5-2.5 (3) MG/3ML SOLN Take 3 mLs by nebulization every 4 (four) hours as needed. 03/23/23   Wallis Bamberg, PA-C  levocetirizine (XYZAL) 5 MG tablet Take 1 tablet (5 mg total) by mouth every evening. 03/23/23   Wallis Bamberg, PA-C  levothyroxine (SYNTHROID) 88 MCG tablet Take 1 tablet (88 mcg total) by mouth daily before breakfast. 01/03/23   Claiborne Rigg, NP  meloxicam (MOBIC) 7.5 MG tablet TAKE 1 TABLET BY MOUTH EVERY DAY 06/25/23   Hoy Register, MD  metFORMIN (GLUCOPHAGE-XR) 500 MG 24 hr tablet TAKE 2 TABLETS BY MOUTH TWICE A DAY 04/17/23   Claiborne Rigg, NP  Misc. Devices MISC Provide patient with shower chair and grab bar. ICD10 M17.10 M54.16 03/03/23   Claiborne Rigg, NP  multivitamin-iron-minerals-folic acid (CENTRUM) chewable tablet Chew 1 tablet by mouth daily. 07/12/20   Tyson Alias, MD  naloxegol oxalate (MOVANTIK) 25 MG TABS tablet TAKE 1 TABLET (25 MG TOTAL) BY MOUTH DAILY. 06/19/23   Claiborne Rigg, NP  nystatin (NYSTATIN) powder Apply 1 application topically 3 (three) times daily. 03/10/21   [name], MD  nystatin cream (MYCOSTATIN) Apply 1 Application topically 2 (two) times daily. 01/03/23   Claiborne Rigg, NP  ondansetron (ZOFRAN-ODT) 4 MG disintegrating tablet Take 1 tablet (4 mg total) by mouth every 8 (eight) hours as needed for nausea or vomiting. 07/09/23   Anders Simmonds, PA-C  PREMARIN vaginal cream PLACE 1 APPLICATORFUL VAGINALLY DAILY. Patient taking differently: 1 applicator 2 (two) times a week. 11/23/21   Tyson Alias, MD  promethazine-dextromethorphan (PROMETHAZINE-DM) 6.25-15 MG/5ML syrup Take 5 mLs by mouth 4 (four) times daily as needed for cough.  05/14/23   Claiborne Rigg, NP  rosuvastatin (CRESTOR) 20 MG tablet Take 1 tablet (20 mg total) by mouth daily. 10/04/22   Anders Simmonds, PA-C  Tiotropium Bromide Monohydrate (SPIRIVA RESPIMAT) 2.5 MCG/ACT AERS Inhale 2 puffs into the lungs daily. 12/30/22   Allwardt, Crist Infante, PA-C  traMADol (ULTRAM) 50 MG tablet Take 50 mg by mouth every 6 (six) hours as needed for moderate pain.    [provider]  triamcinolone (KENALOG) 0.025 % ointment APPLY TO AFFECTED AREA TWICE A DAY 05/22/23   Hoy Register, MD  zolpidem (AMBIEN) 5 MG tablet TAKE 1 TABLET BY MOUTH EVERYDAY AT BEDTIME 05/22/23   Claiborne Rigg, NP    ROS: Neg HEENT Neg resp Neg cardiac Neg GU Neg MS Neg psych Neg neuro  Objective:   Vitals:  07/09/23 1529  BP: 115/74  Pulse: 84  SpO2: 97%  Weight: 189 lb 6 oz (85.9 kg)   Exam General appearance : Awake, alert, not in any distress. Speech Clear. Not toxic looking HEENT: Atraumatic and Normocephalic Neck: Supple, no JVD. No cervical lymphadenopathy.  Chest: Good air entry bilaterally, CTAB.  No rales/rhonchi/wheezing CVS: S1 S2 regular, no murmurs.  Extremities: B/L Lower Ext shows no edema, both legs are warm to touch Neurology: Awake alert, and oriented X 3, CN II-XII intact, Non focal Skin: No Rash  Data Review Lab Results  Component Value Date   HGBA1C 6.9 05/02/2023   HGBA1C 6.5 (H) 10/04/2022   HGBA1C 6.2 (H) 04/19/2022    Assessment & Plan   1. Uncontrolled type 2 diabetes mellitus with hyperglycemia (HCC) Stable.  Blood sugar 132 - Glucose (CBG)  2. Cough in adult Fluids, rest-these symptoms improving - COVID-19, Flu A+B and RSV  3. Nausea-likely viral syndrome.  Stay hydrated - ondansetron (ZOFRAN-ODT) 4 MG disintegrating tablet; Take 1 tablet (4 mg total) by mouth every 8 (eight) hours as needed for nausea or vomiting.  Dispense: 30 tablet; Refill: 1    Return for appointment with [name] in September for chronic  conditions.  The patient was given clear instructions to go to ER or return to medical center if symptoms don't improve, worsen or new problems develop. The patient verbalized understanding. The patient was told to call to get lab results if they haven't heard anything in the next week.      Georgian Co, PA-C [facility] [place], [place] [phone]   07/09/2023, 3:52 PM

## 2023-07-10 ENCOUNTER — Other Ambulatory Visit: Payer: Self-pay | Admitting: *Deleted

## 2023-07-10 ENCOUNTER — Telehealth (INDEPENDENT_AMBULATORY_CARE_PROVIDER_SITE_OTHER): Payer: Self-pay | Admitting: Nurse Practitioner

## 2023-07-10 DIAGNOSIS — I872 Venous insufficiency (chronic) (peripheral): Secondary | ICD-10-CM

## 2023-07-10 DIAGNOSIS — J449 Chronic obstructive pulmonary disease, unspecified: Secondary | ICD-10-CM | POA: Diagnosis not present

## 2023-07-10 NOTE — Telephone Encounter (Signed)
Results are not back to be resulted.

## 2023-07-10 NOTE — Telephone Encounter (Signed)
Copied from CRM [id number]. Topic: General - Inquiry >> Jul 10, 2023 10:36 AM De Blanch wrote: Reason for CRM: Pt is calling to f/u on recent lab results.   Please advise.

## 2023-07-11 ENCOUNTER — Other Ambulatory Visit: Payer: Self-pay | Admitting: Nurse Practitioner

## 2023-07-11 DIAGNOSIS — J441 Chronic obstructive pulmonary disease with (acute) exacerbation: Secondary | ICD-10-CM

## 2023-07-11 LAB — COVID-19, FLU A+B AND RSV

## 2023-07-11 NOTE — Telephone Encounter (Signed)
Should be using her nebulizer machine daily for cough

## 2023-07-11 NOTE — Telephone Encounter (Addendum)
Patient is calling for test results-informed not resulted yet. Patient requests something to help with her cough- patient had cough syrup before that helped her rest at night- can she get that?   Patient notified handicap form ready.

## 2023-07-11 NOTE — Telephone Encounter (Signed)
Return call to patient unanswered.  Voicemail left to return call for results.   Handicap placard signed and waiting for pick up at the front desk of our office.

## 2023-07-12 ENCOUNTER — Other Ambulatory Visit: Payer: Self-pay | Admitting: Nurse Practitioner

## 2023-07-12 NOTE — Telephone Encounter (Signed)
Unable to reach. Voicemail is full

## 2023-07-12 NOTE — Telephone Encounter (Signed)
Requested medications are due for refill today.  unsure  Requested medications are on the active medications list.  yes  Last refill. 05/14/2023 0 rf  Future visit scheduled.   yes  Notes to clinic.  Not usually a long term medication please review for refill.    Requested Prescriptions  Pending Prescriptions Disp Refills   promethazine-dextromethorphan (PROMETHAZINE-DM) 6.25-15 MG/5ML syrup 118 mL 0    Sig: Take 5 mLs by mouth 4 (four) times daily as needed for cough.     Ear, Nose, and Throat:  Antitussives/Expectorants Passed - 07/12/2023  9:42 AM      Passed - Valid encounter within last 12 months    Recent Outpatient Visits           3 days ago Uncontrolled type 2 diabetes mellitus with hyperglycemia [facility])   [place] [facility] [place], [place], [place]   2 months ago Uncontrolled type 2 diabetes mellitus with hyperglycemia [facility])   [place] [facility] [place], Shea Stakes, NP   5 months ago Chronic venous insufficiency   [place] Beatrice [place], Shea Stakes, NP   6 months ago Primary hypertension   [place] [facility] Claiborne Rigg, NP   9 months ago Chronic constipation   [facility] [place], Marzella Schlein, [place]       Future Appointments             In 3 weeks Claiborne Rigg, NP [facility]

## 2023-07-12 NOTE — Telephone Encounter (Signed)
Medication Refill - Medication: promethazine-dextromethorphan (PROMETHAZINE-DM) 6.25-15 MG/5ML syrup  Has the patient contacted their pharmacy? No.  Preferred Pharmacy (with phone number or street name):  [id number] [place], [place] - [address]  [address] [place] [address]  Phone:  [phone]  Fax:  [phone]   Has the patient been seen for an appointment in the last year OR does the patient have an upcoming appointment? Yes.    Agent: Please be advised that RX refills may take up to 3 business days. We ask that you follow-up with your pharmacy.

## 2023-07-12 NOTE — Telephone Encounter (Signed)
Patient called office back, call was transferred.  Advised patient of PCP recommendations (nebulizer solution- DuoNeb)  per PCP.   She states she called pharmacy for  a  RF on yesterday. It will take a while to receive because it's mailed off.  She states she is taking Robitussin, Nyquil, inhalers. States it has went to her chest and ears.   Advised patient will forward message to PCP with reply but if in the meantime sx's worsen while waiting for reply and nebules to go to an UC.  Patient verbalized understanding.   Please advise.

## 2023-07-13 ENCOUNTER — Telehealth: Payer: Self-pay

## 2023-07-13 ENCOUNTER — Other Ambulatory Visit: Payer: Self-pay | Admitting: Nurse Practitioner

## 2023-07-13 DIAGNOSIS — E1165 Type 2 diabetes mellitus with hyperglycemia: Secondary | ICD-10-CM

## 2023-07-13 MED ORDER — IPRATROPIUM-ALBUTEROL 0.5-2.5 (3) MG/3ML IN SOLN: 3.0000 mL | RESPIRATORY_TRACT | 0 refills | Status: AC | PRN

## 2023-07-13 MED ORDER — PROMETHAZINE-DM 6.25-15 MG/5ML PO SYRP: 5.0000 mL | ORAL_SOLUTION | Freq: Four times a day (QID) | ORAL | 0 refills | Status: DC | PRN

## 2023-07-13 NOTE — Telephone Encounter (Signed)
Unable to reach patient by phone to relay response.

## 2023-07-13 NOTE — Telephone Encounter (Signed)
Copied from CRM [id number]. Topic: General - Other >> Jul 13, 2023 12:31 PM Everette C wrote: Reason for CRM: The patient has returned missed phone call from P. Pryor  Please contact further possible

## 2023-07-13 NOTE — Telephone Encounter (Signed)
Pt was called and no vm was left due to mailbox being full. Information has been sent to nurse pool.  

## 2023-07-13 NOTE — Telephone Encounter (Signed)
-----   Message from Georgian Co sent at 07/13/2023  9:36 AM EDT ----- Swabs negative for flu, RSV, and Covid.  Thanks, Georgian Co, PA-C

## 2023-07-13 NOTE — Telephone Encounter (Signed)
Will send cough syrup. Please explain that cough syrup is not recommended long term. This is why I referred her to the lung doctor. They called her several times to reschedule and were not able to get in touch with her. She needs to call them to schedule. I can't keep giving her cough syrup because there is an issue with her lungs that needs to be evaluated by the pulmonologist  Placed in [facility]  [address] [place] [address] Ph# [phone]

## 2023-07-14 ENCOUNTER — Other Ambulatory Visit: Payer: Self-pay | Admitting: Family Medicine

## 2023-07-14 DIAGNOSIS — I10 Essential (primary) hypertension: Secondary | ICD-10-CM

## 2023-07-16 NOTE — Telephone Encounter (Signed)
Unable to reach patient by phone to relay response.

## 2023-07-17 ENCOUNTER — Encounter (HOSPITAL_COMMUNITY): Payer: Self-pay

## 2023-07-17 ENCOUNTER — Other Ambulatory Visit: Payer: Self-pay | Admitting: Nurse Practitioner

## 2023-07-17 ENCOUNTER — Ambulatory Visit (HOSPITAL_COMMUNITY)
Admission: RE | Admit: 2023-07-17 | Discharge: 2023-07-17 | Disposition: A | Discharge status: Home / Self Care | Payer: Medicare HMO | Source: Ambulatory Visit | Attending: Family Medicine | Admitting: Family Medicine

## 2023-07-17 VITALS — BP 123/82 | HR 96 | Temp 98.8°F | Resp 18

## 2023-07-17 DIAGNOSIS — I10 Essential (primary) hypertension: Secondary | ICD-10-CM

## 2023-07-17 DIAGNOSIS — J441 Chronic obstructive pulmonary disease with (acute) exacerbation: Secondary | ICD-10-CM

## 2023-07-17 MED ORDER — PREDNISONE 20 MG PO TABS: 40.0000 mg | ORAL_TABLET | Freq: Every day | ORAL | 0 refills | Status: AC

## 2023-07-17 MED ORDER — ALBUTEROL SULFATE HFA 108 (90 BASE) MCG/ACT IN AERS: 2.0000 | INHALATION_SPRAY | RESPIRATORY_TRACT | 0 refills | Status: DC | PRN

## 2023-07-17 MED ORDER — AZITHROMYCIN 250 MG PO TABS: ORAL_TABLET | ORAL | 0 refills | Status: DC

## 2023-07-17 NOTE — ED Triage Notes (Signed)
Pt had cough, congestion, headache, sore throat and was seen at PCP last Monday. Covid was negative. Pt reports feeling worse. Taking Tylenol and Nyquil as well as RX cough syrup.

## 2023-07-17 NOTE — ED Provider Notes (Signed)
[facility]    CSN: [id number] Arrival date & time: 07/17/23  1200      History   Chief Complaint Chief Complaint  Patient presents with   appt 12    HPI Mary Gray is a 62 y.o. female.   HPI Here for about 7-8 days of nasal and chest congestion. A good bit of cough. She is wheezing at night.  No fever or vomiting  She had an inconclusive viral swab and seen at primary care August 19.  Past medical history significant for COPD and for diabetes; she states her sugars yesterday were 120 and 144 Past Medical History:  Diagnosis Date   Asthma    Back pain    Chronic interstitial cystitis    Followed by Dr. Marcine Matar   COPD (chronic obstructive pulmonary disease) (HCC)    DDD (degenerative disc disease), lumbar    Degeneration of cervical intervertebral disc    Depression    Diabetes mellitus, type 2 (HCC) 2007   Heme positive stool    EGD on 04/08/2010 by Dr. Ewing Schlein showed chronic gastritis and a few gastric polyps; pathology showed a fundic gland polyp.  Colonoscopy on 04/08/2010 showed small external and internal hemorrhoids, and a few benign-appearing diminutive polyps in the rectum, the distal sigmoid colon, and in the distal descending colon; pathology showed hyperplastic polyps.    Hypertension    Hypothyroidism    Iron deficiency anemia    Spinal stenosis     Patient Active Problem List   Diagnosis Date Noted   Pain due to dental caries 06/15/2023   Dental infection 06/15/2023   Acute respiratory failure with hypoxia (HCC)    COVID-19 virus infection 01/20/2022   Lumbar spondylosis 03/22/2021   Chronic radicular lumbar pain 03/22/2021   Bilateral primary osteoarthritis of knee 03/22/2021   Chronic pain syndrome 03/22/2021   Tobacco use disorder 03/22/2021   Hyponatremia 03/04/2021   Chronic bilateral low back pain without sciatica 12/13/2020   Sleep disturbance 12/13/2020   Vaginitis 10/27/2020   Right ear pain 10/13/2020   Nausea  02/10/2020   B12 deficiency 11/25/2018   Urinary, incontinence, stress female 09/09/2018   Primary osteoarthritis of right knee 05/01/2018   Chronic venous insufficiency 02/26/2017   Candidal skin infection 03/03/2014   Preventative health care 05/14/2013   Spinal stenosis, lumbar region, with neurogenic claudication 03/01/2011   Hypothyroidism 07/06/2006   Diabetes mellitus type 2, uncontrolled 07/06/2006   Hyperlipemia 07/06/2006   Generalized anxiety disorder 07/06/2006   Essential hypertension 07/06/2006   GERD 07/06/2006   Lumbar degenerative disc disease 07/06/2006    Past Surgical History:  Procedure Laterality Date   CARPAL TUNNEL RELEASE  05/08/2000   By Dr. Katy Fitch. Sypher, Montez Hageman.    OB History   No obstetric history on file.      Home Medications    Prior to Admission medications   Medication Sig Start Date End Date Taking? Authorizing Provider  amitriptyline (ELAVIL) 25 MG tablet Take 25 mg by mouth at bedtime. 07/15/23  Yes [provider]  azithromycin (ZITHROMAX) 250 MG tablet Take first 2 tablets together, then 1 every day until finished. 07/17/23  Yes Zenia Resides, MD  predniSONE (DELTASONE) 20 MG tablet Take 2 tablets (40 mg total) by mouth daily with breakfast for 5 days. 07/17/23 07/22/23 Yes Zenia Resides, MD  Accu-Chek Softclix Lancets lancets Check blood sugar 1 time per day 07/12/20   Tyson Alias, MD  acetaminophen (TYLENOL)  500 MG tablet Take 1,000 mg by mouth every 6 (six) hours as needed for mild pain.    [provider]  albuterol (VENTOLIN HFA) 108 (90 Base) MCG/ACT inhaler Inhale 2 puffs into the lungs every 4 (four) hours as needed for wheezing or shortness of breath. 07/17/23   Zenia Resides, MD  amLODipine (NORVASC) 2.5 MG tablet Take 1 tablet (2.5 mg total) by mouth daily. 06/19/23   Nahser, Deloris Ping, MD  aspirin EC 81 MG tablet Take 81 mg by mouth daily. Swallow whole.    [provider]  [facility]  VITAMIN B12 1000 MCG tablet TAKE 1 TABLET BY MOUTH EVERY DAY Patient taking differently: Take 1,000 mcg by mouth daily. 01/12/21   Tyson Alias, MD  cyclobenzaprine (FLEXERIL) 5 MG tablet TAKE 1 TABLET BY MOUTH EVERYDAY AT BEDTIME 06/25/23   Hoy Register, MD  dextromethorphan-guaiFENesin (TUSSIN DM) 10-100 MG/5ML liquid Take 10 mLs by mouth every 6 (six) hours as needed for cough. 05/11/23   Claiborne Rigg, NP  enalapril (VASOTEC) 20 MG tablet Take 1 tablet (20 mg total) by mouth daily. 07/05/23   Claiborne Rigg, NP  esomeprazole (NEXIUM) 40 MG capsule TAKE 1 CAPSULE (40 MG TOTAL) BY MOUTH DAILY. 03/29/23 09/25/23  Claiborne Rigg, NP  fluticasone-salmeterol [facility] INHUB) 250-50 MCG/ACT AEPB Inhale 1 puff into the lungs in the morning and at bedtime. 02/20/23   Claiborne Rigg, NP  gabapentin (NEURONTIN) 600 MG tablet TAKE 1 TABLET BY MOUTH THREE TIMES A DAY 04/25/23   Claiborne Rigg, NP  glucose blood (ACCU-CHEK GUIDE) test strip CHECK BLOOD SUGAR 1 TIME PER DAY 05/12/21   Tyson Alias, MD  ipratropium-albuterol (DUONEB) 0.5-2.5 (3) MG/3ML SOLN Take 3 mLs by nebulization every 4 (four) hours as needed. 07/13/23   Claiborne Rigg, NP  levocetirizine (XYZAL) 5 MG tablet Take 1 tablet (5 mg total) by mouth every evening. 03/23/23   Wallis Bamberg, PA-C  levothyroxine (SYNTHROID) 88 MCG tablet Take 1 tablet (88 mcg total) by mouth daily before breakfast. 01/03/23   Claiborne Rigg, NP  meloxicam (MOBIC) 7.5 MG tablet TAKE 1 TABLET BY MOUTH EVERY DAY 06/25/23   Hoy Register, MD  metFORMIN (GLUCOPHAGE-XR) 500 MG 24 hr tablet TAKE 2 TABLETS BY MOUTH TWICE A DAY 07/13/23   Hoy Register, MD  Misc. Devices MISC Provide patient with shower chair and grab bar. ICD10 M17.10 M54.16 03/03/23   Claiborne Rigg, NP  multivitamin-iron-minerals-folic acid (CENTRUM) chewable tablet Chew 1 tablet by mouth daily. 07/12/20   Tyson Alias, MD  naloxegol oxalate (MOVANTIK) 25 MG TABS tablet TAKE 1  TABLET (25 MG TOTAL) BY MOUTH DAILY. 06/19/23   Claiborne Rigg, NP  nystatin (NYSTATIN) powder Apply 1 application topically 3 (three) times daily. 03/10/21   [name], MD  nystatin cream (MYCOSTATIN) Apply 1 Application topically 2 (two) times daily. 01/03/23   Claiborne Rigg, NP  ondansetron (ZOFRAN-ODT) 4 MG disintegrating tablet Take 1 tablet (4 mg total) by mouth every 8 (eight) hours as needed for nausea or vomiting. 07/09/23   Anders Simmonds, PA-C  PREMARIN vaginal cream PLACE 1 APPLICATORFUL VAGINALLY DAILY. Patient taking differently: 1 applicator 2 (two) times a week. 11/23/21   Tyson Alias, MD  rosuvastatin (CRESTOR) 20 MG tablet Take 1 tablet (20 mg total) by mouth daily. 10/04/22   Anders Simmonds, PA-C  Tiotropium Bromide Monohydrate (SPIRIVA RESPIMAT) 2.5 MCG/ACT AERS Inhale 2 puffs into the lungs  daily. 12/30/22   Allwardt, Crist Infante, PA-C  traMADol (ULTRAM) 50 MG tablet Take 50 mg by mouth every 6 (six) hours as needed for moderate pain.    [provider]  triamcinolone (KENALOG) 0.025 % ointment APPLY TO AFFECTED AREA TWICE A DAY 05/22/23   Hoy Register, MD  zolpidem (AMBIEN) 5 MG tablet TAKE 1 TABLET BY MOUTH EVERYDAY AT BEDTIME 05/22/23   Claiborne Rigg, NP    Family History Family History  Problem Relation Age of Onset   Heart attack Father 61   Diabetes Mother    Hypertension Mother    Diabetes Sister    Breast cancer Neg Hx    Colon cancer Neg Hx     Social History Social History   Tobacco Use   Smoking status: Some Days    Current packs/day: 0.50    Average packs/day: 0.5 packs/day for 45.0 years (22.5 ttl pk-yrs)    Types: Cigarettes   Smokeless tobacco: Never   Tobacco comments:    1/2 pack per day  Vaping Use   Vaping status: Never Used  Substance Use Topics   Alcohol use: Yes    Alcohol/week: 1.0 standard drink of alcohol    Types: 1 Cans of beer per week    Comment: Occ   Drug use: No     Allergies   Amoxicillin,  Codeine sulfate, Meperidine and related, Minocycline hcl, Penicillins, Propoxyphene n-acetaminophen, and Tetracycline hcl   Review of Systems Review of Systems   Physical Exam Triage Vital Signs ED Triage Vitals  Encounter Vitals Group     BP 07/17/23 1210 123/82     Systolic BP Percentile --      Diastolic BP Percentile --      Pulse Rate 07/17/23 1210 96     Resp 07/17/23 1210 18     Temp 07/17/23 1210 98.8 F (37.1 C)     Temp Source 07/17/23 1210 Oral     SpO2 07/17/23 1210 98 %     Weight --      Height --      Head Circumference --      Peak Flow --      Pain Score 07/17/23 1209 9     Pain Loc --      Pain Education --      Exclude from Growth Chart --    No data found.  Updated Vital Signs BP 123/82 (BP Location: Right Arm)   Pulse 96   Temp 98.8 F (37.1 C) (Oral)   Resp 18   LMP 01/17/2009   SpO2 98%   Visual Acuity Right Eye Distance:   Left Eye Distance:   Bilateral Distance:    Right Eye Near:   Left Eye Near:    Bilateral Near:     Physical Exam Vitals reviewed.  Constitutional:      General: She is not in acute distress.    Appearance: She is not ill-appearing, toxic-appearing or diaphoretic.  HENT:     Right Ear: Tympanic membrane and ear canal normal.     Left Ear: Tympanic membrane and ear canal normal.     Nose: Congestion present.     Mouth/Throat:     Mouth: Mucous membranes are moist.     Pharynx: No oropharyngeal exudate or posterior oropharyngeal erythema.  Eyes:     Extraocular Movements: Extraocular movements intact.     Conjunctiva/sclera: Conjunctivae normal.     Pupils: Pupils are equal, round, and reactive to  light.  Cardiovascular:     Rate and Rhythm: Normal rate and regular rhythm.     Heart sounds: No murmur heard. Pulmonary:     Effort: No respiratory distress.     Breath sounds: No stridor. No wheezing, rhonchi or rales.     Comments: Breath sounds are little distant, but air movement is good Musculoskeletal:      Cervical back: Neck supple.  Lymphadenopathy:     Cervical: No cervical adenopathy.  Skin:    Capillary Refill: Capillary refill takes less than 2 seconds.     Coloration: Skin is not jaundiced or pale.  Neurological:     General: No focal deficit present.     Mental Status: She is alert and oriented to person, place, and time.  Psychiatric:        Behavior: Behavior normal.      UC Treatments / Results  Labs (all labs ordered are listed, but only abnormal results are displayed) Labs Reviewed - No data to display  EKG   Radiology No results found.  Procedures Procedures (including critical care time)  Medications Ordered in UC Medications - No data to display  Initial Impression / Assessment and Plan / UC Course  I have reviewed the triage vital signs and the nursing notes.  Pertinent labs & imaging results that were available during my care of the patient were reviewed by me and considered in my medical decision making (see chart for details).        Since she is allergic to amoxicillin and had trouble taking tetracycline azithromycin is sent in along with a 5-day burst of prednisone and an albuterol inhaler, for her COPD exacerbation Final Clinical Impressions(s) / UC Diagnoses   Final diagnoses:  COPD exacerbation (HCC)     Discharge Instructions      Take prednisone 20 mg--2 daily for 5 days; this medication can elevate your sugars  Azithromycin 250 mg--take 2 orally the first day, then 1 daily for 4 more days.  Albuterol inhaler--do 2 puffs every 4 hours as needed for shortness of breath or wheezing       ED Prescriptions     Medication Sig Dispense Auth. Provider   albuterol (VENTOLIN HFA) 108 (90 Base) MCG/ACT inhaler Inhale 2 puffs into the lungs every 4 (four) hours as needed for wheezing or shortness of breath. 18 g Zenia Resides, MD   predniSONE (DELTASONE) 20 MG tablet Take 2 tablets (40 mg total) by mouth daily with breakfast  for 5 days. 10 tablet Zenia Resides, MD   azithromycin (ZITHROMAX) 250 MG tablet Take first 2 tablets together, then 1 every day until finished. 6 tablet Marlinda Mike Janace Aris, MD      PDMP not reviewed this encounter.   Zenia Resides, MD 07/17/23 1302

## 2023-07-17 NOTE — Discharge Instructions (Addendum)
Take prednisone 20 mg--2 daily for 5 days; this medication can elevate your sugars  Azithromycin 250 mg--take 2 orally the first day, then 1 daily for 4 more days.  Albuterol inhaler--do 2 puffs every 4 hours as needed for shortness of breath or wheezing

## 2023-07-19 ENCOUNTER — Other Ambulatory Visit: Payer: Self-pay | Admitting: Nurse Practitioner

## 2023-07-19 ENCOUNTER — Ambulatory Visit (HOSPITAL_COMMUNITY): Payer: Medicare HMO

## 2023-07-19 DIAGNOSIS — I10 Essential (primary) hypertension: Secondary | ICD-10-CM

## 2023-07-19 NOTE — Telephone Encounter (Signed)
Medication Refill - Medication: furosemide (LASIX) injection 40 mg   Has the patient contacted their pharmacy? No.  Preferred Pharmacy (with phone number or street name):  [id number] [place], [place] - [address] Phone: [phone]  Fax: [phone]     Has the patient been seen for an appointment in the last year OR does the patient have an upcoming appointment? Yes.    Agent: Please be advised that RX refills may take up to 3 business days. We ask that you follow-up with your pharmacy.

## 2023-07-20 DIAGNOSIS — E6609 Other obesity due to excess calories: Secondary | ICD-10-CM | POA: Diagnosis not present

## 2023-07-20 DIAGNOSIS — Z79899 Other long term (current) drug therapy: Secondary | ICD-10-CM | POA: Diagnosis not present

## 2023-07-20 DIAGNOSIS — Z6835 Body mass index (BMI) 35.0-35.9, adult: Secondary | ICD-10-CM | POA: Diagnosis not present

## 2023-07-20 DIAGNOSIS — M5136 Other intervertebral disc degeneration, lumbar region: Secondary | ICD-10-CM | POA: Diagnosis not present

## 2023-07-20 NOTE — Telephone Encounter (Signed)
Requested Prescriptions  Refused Prescriptions Disp Refills   furosemide (LASIX) 40 MG tablet 90 tablet 1    Sig: Take 1 tablet (40 mg total) by mouth daily.     Cardiovascular:  Diuretics - Loop Failed - 07/19/2023  4:06 PM      Failed - Na in normal range and within 180 days    Sodium  Date Value Ref Range Status  05/02/2023 130 (L) 134 - 144 mmol/L Final         Failed - Cl in normal range and within 180 days    Chloride  Date Value Ref Range Status  05/02/2023 90 (L) 96 - 106 mmol/L Final         Failed - Mg Level in normal range and within 180 days    No results found for: "MG"       Passed - K in normal range and within 180 days    Potassium  Date Value Ref Range Status  05/02/2023 4.1 3.5 - 5.2 mmol/L Final         Passed - Ca in normal range and within 180 days    Calcium  Date Value Ref Range Status  05/02/2023 9.2 8.7 - 10.3 mg/dL Final         Passed - Cr in normal range and within 180 days    Creat  Date Value Ref Range Status  02/09/2015 0.55 0.50 - 1.10 mg/dL Final   Creatinine, Ser  Date Value Ref Range Status  05/02/2023 0.62 0.57 - 1.00 mg/dL Final   Creatinine,U  Date Value Ref Range Status  04/29/2010 71.9 mg/dL Final   Creatinine, Urine  Date Value Ref Range Status  03/22/2015 27.33 >20.0 mg/dL Final    Comment:    * (PPS) Presumptive positive screen result to be verified by         quantitative LC/MS or GC/MS confirmation testing.          Passed - Last BP in normal range    BP Readings from Last 1 Encounters:  07/17/23 123/82         Passed - Valid encounter within last 6 months    Recent Outpatient Visits           1 week ago Uncontrolled type 2 diabetes mellitus with hyperglycemia [facility])   [place] [facility] [place], [place], [place]   2 months ago Uncontrolled type 2 diabetes mellitus with hyperglycemia [facility])   [place] [facility] [place], Shea Stakes, NP   5 months ago Chronic  venous insufficiency   [place] [facility] [place], Shea Stakes, NP   6 months ago Primary hypertension   [place] [facility] Claiborne Rigg, NP   9 months ago Chronic constipation   [facility] [place], Marzella Schlein, [place]       Future Appointments             In 2 weeks Claiborne Rigg, NP [facility]

## 2023-07-22 ENCOUNTER — Other Ambulatory Visit: Payer: Self-pay | Admitting: Family Medicine

## 2023-07-22 DIAGNOSIS — M5136 Other intervertebral disc degeneration, lumbar region: Secondary | ICD-10-CM

## 2023-07-24 NOTE — Telephone Encounter (Signed)
Requested Prescriptions  Pending Prescriptions Disp Refills   meloxicam (MOBIC) 7.5 MG tablet [Pharmacy Med Name: MELOXICAM 7.5 MG TABLET] 90 tablet 0    Sig: TAKE 1 TABLET BY MOUTH EVERY DAY     Analgesics:  COX2 Inhibitors Failed - 07/22/2023  1:17 AM      Failed - Manual Review: Labs are only required if the patient has taken medication for more than 8 weeks.      Failed - HGB in normal range and within 360 days    Hemoglobin  Date Value Ref Range Status  05/02/2023 10.3 (L) 11.1 - 15.9 g/dL Final         Failed - HCT in normal range and within 360 days    Hematocrit  Date Value Ref Range Status  05/02/2023 31.9 (L) 34.0 - 46.6 % Final         Passed - Cr in normal range and within 360 days    Creat  Date Value Ref Range Status  02/09/2015 0.55 0.50 - 1.10 mg/dL Final   Creatinine, Ser  Date Value Ref Range Status  05/02/2023 0.62 0.57 - 1.00 mg/dL Final   Creatinine,U  Date Value Ref Range Status  04/29/2010 71.9 mg/dL Final   Creatinine, Urine  Date Value Ref Range Status  03/22/2015 27.33 >20.0 mg/dL Final    Comment:    * (PPS) Presumptive positive screen result to be verified by         quantitative LC/MS or GC/MS confirmation testing.          Passed - AST in normal range and within 360 days    AST  Date Value Ref Range Status  05/02/2023 34 0 - 40 IU/L Final         Passed - ALT in normal range and within 360 days    ALT  Date Value Ref Range Status  05/02/2023 23 0 - 32 IU/L Final         Passed - eGFR is 30 or above and within 360 days    GFR, Est African American  Date Value Ref Range Status  02/09/2015 >89 mL/min Final   GFR calc Af Amer  Date Value Ref Range Status  02/20/2020 >60 >60 mL/min Final   GFR, Est Non African American  Date Value Ref Range Status  02/09/2015 >89 mL/min Final    Comment:      The estimated GFR is a calculation valid for adults (>=41 years old) that uses the CKD-EPI algorithm to adjust for age and sex. It is    not to be used for children, pregnant women, hospitalized patients,    patients on dialysis, or with rapidly changing kidney function. According to the NKDEP, eGFR >89 is normal, 60-89 shows mild impairment, 30-59 shows moderate impairment, 15-29 shows severe impairment and <15 is ESRD.      GFR, Estimated  Date Value Ref Range Status  02/28/2023 >60 >60 mL/min Final    Comment:    (NOTE) Calculated using the CKD-EPI Creatinine Equation (2021)    eGFR  Date Value Ref Range Status  05/02/2023 101 >59 mL/min/1.73 Final         Passed - Patient is not pregnant      Passed - Valid encounter within last 12 months    Recent Outpatient Visits           2 weeks ago Uncontrolled type 2 diabetes mellitus with hyperglycemia [facility])   [place] [facility] [place], Marylene [place], [place]   2 months ago Uncontrolled type 2 diabetes mellitus with hyperglycemia [facility])   [place] [facility] [place], Shea Stakes, NP   6 months ago Chronic venous insufficiency   [place] [facility] [place], Shea Stakes, NP   6 months ago Primary hypertension   [place] [facility] Claiborne Rigg, NP   9 months ago Chronic constipation   [facility] [place], Marzella Schlein, [place]       Future Appointments             In 1 week Claiborne Rigg, NP [facility]

## 2023-07-25 ENCOUNTER — Telehealth: Payer: Self-pay | Admitting: Physician Assistant

## 2023-07-25 ENCOUNTER — Telehealth: Payer: Self-pay | Admitting: Nurse Practitioner

## 2023-07-25 ENCOUNTER — Ambulatory Visit: Payer: Medicare HMO | Admitting: Physician Assistant

## 2023-07-25 NOTE — Telephone Encounter (Signed)
furosemide (LASIX) 20 MG tablet is not on current medication list. Patient states urgent care reduced her furosemide from 40 to 20 because she was experiencing edema in her legs. No longer experiencing this symptom. Routing for approval.

## 2023-07-25 NOTE — Telephone Encounter (Signed)
PT called and canceled appointment today at 230

## 2023-07-25 NOTE — Telephone Encounter (Signed)
Routing to PCP

## 2023-07-25 NOTE — Telephone Encounter (Signed)
Medication Refill - Medication: furosemide (LASIX) 20 MG tablet   Pt states that she is completely out, and that urgent care reduced her furosemide from 40 to 20 because she was experiencing edema in her legs. No longer experiencing this symptom.   Has the patient contacted their pharmacy? Yes.   (Agent: If no, request that the patient contact the pharmacy for the refill. If patient does not wish to contact the pharmacy document the reason why and proceed with request.) (Agent: If yes, when and what did the pharmacy advise?)  Preferred Pharmacy (with phone number or street name):  [id number] [place], [place] - [address]  [address] [place] [address]  Phone: [phone] Fax: [phone]   Has the patient been seen for an appointment in the last year OR does the patient have an upcoming appointment? Yes.    Agent: Please be advised that RX refills may take up to 3 business days. We ask that you follow-up with your pharmacy.

## 2023-07-26 ENCOUNTER — Ambulatory Visit (HOSPITAL_COMMUNITY): Payer: Medicare HMO

## 2023-07-26 ENCOUNTER — Telehealth: Payer: Self-pay | Admitting: Nurse Practitioner

## 2023-07-26 NOTE — Telephone Encounter (Signed)
Patient would like to know why her furosemide (LASIX) 40 MG tablet is not being refilled. Please advise.

## 2023-07-26 NOTE — Telephone Encounter (Signed)
Patient called and advised in a separate encounter from 07/25/23 the request for furosemide was sent to the provider and she has yet to review it. Advised due to the furosemide was last refilled by a different provider, [name] will have to review and sign off on it. Patient verbalized understanding. Patient also says the last time she was in the office she saw [place] and it was mentioned that she would need a dermatology referral. She says she hasn't heard from anyone and it's been probably 3 weeks. Advised I will send this request to [name] to place the referral. She says she wants to see Dr. Terri Piedra on [facility] [place].

## 2023-07-27 NOTE — Telephone Encounter (Signed)
She needs labs. I can not send furosemide due to her sodium being low.

## 2023-07-27 NOTE — Telephone Encounter (Signed)
Can discuss at office visit or she can call Dr. Dorita Sciara office as she is a previous patient there and see if they will see her without a referral.

## 2023-07-27 NOTE — Telephone Encounter (Addendum)
Noted. Patient voiced that she had spoken to the office of Dr. Terri Piedra and they are requesting a new referral.

## 2023-07-27 NOTE — Telephone Encounter (Signed)
Already taken care of by Triage nurse.

## 2023-07-27 NOTE — Telephone Encounter (Addendum)
Spoke with patient. Patient voiced that she has a little swelling in her legs and ankle and felt like lasix would help . Patient advised that per provider that she will need labs before having the lasix refilled, because her sodium is below normal.Patient voiced understanding. Voiced that she has an appointment on the 13th of this month with her PCP and she will talk with her then. Patient asked if she could a dermatology referral. Voiced that she has 2 spots on her back that she had mentioned to the provider during her last visit. Advised patient that she may discuss this with her PCP during her OV next week.  Advised that I would forward a message to her PCP as well.

## 2023-08-03 ENCOUNTER — Ambulatory Visit: Payer: Medicare HMO | Admitting: Nurse Practitioner

## 2023-08-04 ENCOUNTER — Other Ambulatory Visit: Payer: Self-pay | Admitting: Family Medicine

## 2023-08-04 ENCOUNTER — Other Ambulatory Visit: Payer: Self-pay | Admitting: Nurse Practitioner

## 2023-08-04 DIAGNOSIS — M5136 Other intervertebral disc degeneration, lumbar region: Secondary | ICD-10-CM

## 2023-08-04 DIAGNOSIS — G894 Chronic pain syndrome: Secondary | ICD-10-CM

## 2023-08-04 DIAGNOSIS — G2581 Restless legs syndrome: Secondary | ICD-10-CM

## 2023-08-04 DIAGNOSIS — M47816 Spondylosis without myelopathy or radiculopathy, lumbar region: Secondary | ICD-10-CM

## 2023-08-04 DIAGNOSIS — F418 Other specified anxiety disorders: Secondary | ICD-10-CM

## 2023-08-06 ENCOUNTER — Other Ambulatory Visit: Payer: Self-pay | Admitting: Nurse Practitioner

## 2023-08-06 DIAGNOSIS — M51369 Other intervertebral disc degeneration, lumbar region without mention of lumbar back pain or lower extremity pain: Secondary | ICD-10-CM

## 2023-08-06 DIAGNOSIS — M5136 Other intervertebral disc degeneration, lumbar region: Secondary | ICD-10-CM

## 2023-08-06 DIAGNOSIS — R110 Nausea: Secondary | ICD-10-CM

## 2023-08-06 DIAGNOSIS — G894 Chronic pain syndrome: Secondary | ICD-10-CM

## 2023-08-06 DIAGNOSIS — M47816 Spondylosis without myelopathy or radiculopathy, lumbar region: Secondary | ICD-10-CM

## 2023-08-06 DIAGNOSIS — G2581 Restless legs syndrome: Secondary | ICD-10-CM

## 2023-08-06 DIAGNOSIS — F418 Other specified anxiety disorders: Secondary | ICD-10-CM

## 2023-08-06 NOTE — Telephone Encounter (Signed)
Requested medication (s) are due for refill today: routing for approval  Requested medication (s) are on the active medication list: yes  Last refill:  06/25/23  Future visit scheduled: yes  Notes to clinic:  Unable to refill per protocol, cannot delegate.      Requested Prescriptions  Pending Prescriptions Disp Refills   cyclobenzaprine (FLEXERIL) 5 MG tablet [Pharmacy Med Name: CYCLOBENZAPRINE 5 MG TABLET] 30 tablet 0    Sig: TAKE 1 TABLET BY MOUTH EVERYDAY AT BEDTIME     Not Delegated - Analgesics:  Muscle Relaxants Failed - 08/04/2023  2:28 PM      Failed - This refill cannot be delegated      Passed - Valid encounter within last 6 months    Recent Outpatient Visits           4 weeks ago Uncontrolled type 2 diabetes mellitus with hyperglycemia [facility])   [place] [facility] [place], [place], [place]   3 months ago Uncontrolled type 2 diabetes mellitus with hyperglycemia [facility])   [place] [facility] [place], Shea Stakes, NP   6 months ago Chronic venous insufficiency   [place] [facility] [place], Shea Stakes, NP   7 months ago Primary hypertension   [place] [facility], Shea Stakes, NP   10 months ago Chronic constipation   [facility] [place], Marzella Schlein, [place]       Future Appointments             In 3 days Claiborne Rigg, NP [facility]

## 2023-08-06 NOTE — Telephone Encounter (Signed)
Medication Refill - Medication: cyclobenzaprine (FLEXERIL) 5 MG tablet [[id number]]    zolpidem (AMBIEN) 5 MG tablet [[id number]] ondansetron (ZOFRAN-ODT) 4 MG disintegrating tablet [[id number]]         Has the patient contacted their pharmacy? Yes.     Preferred Pharmacy (with phone number or street name):[phone] [place], [place] - [address]  [address] [place] [address]  Phone:  [phone]  Fax:  [phone]  DEA #:  [id number]       Has the patient been seen for an appointment in the last year OR does the patient have an upcoming appointment? Yes.    Agent: Please be advised that RX refills may take up to 3 business days. We ask that you follow-up with your pharmacy.

## 2023-08-06 NOTE — Telephone Encounter (Signed)
Requested medication (s) are due for refill today: routing for approval  Requested medication (s) are on the active medication list: yes  Last refill:  05/22/23  Future visit scheduled: yes  Notes to clinic:  Unable to refill per protocol, cannot delegate.      Requested Prescriptions  Pending Prescriptions Disp Refills   zolpidem (AMBIEN) 5 MG tablet [Pharmacy Med Name: ZOLPIDEM TARTRATE 5 MG TABLET] 30 tablet 1    Sig: TAKE 1 TABLET BY MOUTH EVERYDAY AT BEDTIME     Not Delegated - Psychiatry:  Anxiolytics/Hypnotics Failed - 08/04/2023  2:27 PM      Failed - This refill cannot be delegated      Failed - Urine Drug Screen completed in last 360 days      Passed - Valid encounter within last 6 months    Recent Outpatient Visits           4 weeks ago Uncontrolled type 2 diabetes mellitus with hyperglycemia [facility])   [place] [facility] [place], [place], [place]   3 months ago Uncontrolled type 2 diabetes mellitus with hyperglycemia [facility])   [place] [facility] [place], Shea Stakes, NP   6 months ago Chronic venous insufficiency   [place] [facility] [place], Shea Stakes, NP   7 months ago Primary hypertension   [place] [facility] [place], Shea Stakes, NP   10 months ago Chronic constipation   [place] [facility] [place], Marzella Schlein, [place]       Future Appointments             In 3 days Claiborne Rigg, NP [facility]

## 2023-08-07 NOTE — Telephone Encounter (Signed)
Requested medication (s) are due for refill today: Yes all  Requested medication (s) are on the active medication list: yes all    Last refill: Cyclobenzaprine  06/25/23  #30  0 refills     Ambien  05/22/23  #30  1 refill      Zofran  07/09/23   #30  1 refill  Future visit scheduled    yes 08/09/23  Notes to clinic: Meds not delegated, please review. Thank you.  Requested Prescriptions  Pending Prescriptions Disp Refills   cyclobenzaprine (FLEXERIL) 5 MG tablet 30 tablet 0     Not Delegated - Analgesics:  Muscle Relaxants Failed - 08/06/2023  3:54 PM      Failed - This refill cannot be delegated      Passed - Valid encounter within last 6 months    Recent Outpatient Visits           4 weeks ago Uncontrolled type 2 diabetes mellitus with hyperglycemia [facility])   [place] [facility] [place], [place], [place]   3 months ago Uncontrolled type 2 diabetes mellitus with hyperglycemia [facility])   [place] [facility] [place], Shea Stakes, NP   6 months ago Chronic venous insufficiency   [place] [facility] [place], Shea Stakes, NP   7 months ago Primary hypertension   [place] [facility] [place], Shea Stakes, NP   10 months ago Chronic constipation   [place] [facility] [place], Marzella Schlein, [place]       Future Appointments             In 2 days Claiborne Rigg, NP [facility]             zolpidem (AMBIEN) 5 MG tablet 30 tablet 1     Not Delegated - Psychiatry:  Anxiolytics/Hypnotics Failed - 08/06/2023  3:54 PM      Failed - This refill cannot be delegated      Failed - Urine Drug Screen completed in last 360 days      Passed - Valid encounter within last 6 months    Recent Outpatient Visits           4 weeks ago Uncontrolled type 2 diabetes mellitus with hyperglycemia [facility])   [place] [facility] [place],  [place], [place]   3 months ago Uncontrolled type 2 diabetes mellitus with hyperglycemia [facility])   [place] [facility] [place], Shea Stakes, NP   6 months ago Chronic venous insufficiency   [place] [facility] [place], Shea Stakes, NP   7 months ago Primary hypertension   [place] [facility] [place], Shea Stakes, NP   10 months ago Chronic constipation   [place] [facility] [place], [place], [place]       Future Appointments             In 2 days Claiborne Rigg, NP [facility]             ondansetron (ZOFRAN-ODT) 4 MG disintegrating tablet 30 tablet 1    Sig: Take 1 tablet (4 mg total) by mouth every 8 (eight) hours as needed for nausea or vomiting.     Not Delegated - Gastroenterology: Antiemetics - ondansetron Failed - 08/06/2023  3:54 PM  Failed - This refill cannot be delegated      Passed - AST in normal range and within 360 days    AST  Date Value Ref Range Status  05/02/2023 34 0 - 40 IU/L Final         Passed - ALT in normal range and within 360 days    ALT  Date Value Ref Range Status  05/02/2023 23 0 - 32 IU/L Final         Passed - Valid encounter within last 6 months    Recent Outpatient Visits           4 weeks ago Uncontrolled type 2 diabetes mellitus with hyperglycemia [facility])   [place] [facility] [place], [place], [place]   3 months ago Uncontrolled type 2 diabetes mellitus with hyperglycemia [facility])   [place] [facility] [place], Shea Stakes, NP   6 months ago Chronic venous insufficiency   [place] [facility] [place], Shea Stakes, NP   7 months ago Primary hypertension   [place] [facility] [place], Shea Stakes, NP   10 months ago Chronic constipation   [facility] [place], Marzella Schlein, [place]        Future Appointments             In 2 days Claiborne Rigg, NP [facility]

## 2023-08-08 MED ORDER — ONDANSETRON 4 MG PO TBDP: 4.0000 mg | ORAL_TABLET | Freq: Three times a day (TID) | ORAL | 1 refills | Status: DC | PRN

## 2023-08-09 ENCOUNTER — Ambulatory Visit: Payer: Medicare HMO | Admitting: Nurse Practitioner

## 2023-08-17 DIAGNOSIS — R059 Cough, unspecified: Secondary | ICD-10-CM | POA: Diagnosis not present

## 2023-08-17 DIAGNOSIS — M791 Myalgia, unspecified site: Secondary | ICD-10-CM | POA: Diagnosis not present

## 2023-08-18 ENCOUNTER — Ambulatory Visit (HOSPITAL_COMMUNITY): Payer: Medicare HMO

## 2023-08-20 DIAGNOSIS — Z6835 Body mass index (BMI) 35.0-35.9, adult: Secondary | ICD-10-CM | POA: Diagnosis not present

## 2023-08-20 DIAGNOSIS — Z6836 Body mass index (BMI) 36.0-36.9, adult: Secondary | ICD-10-CM | POA: Diagnosis not present

## 2023-08-20 DIAGNOSIS — M5136 Other intervertebral disc degeneration, lumbar region: Secondary | ICD-10-CM | POA: Diagnosis not present

## 2023-08-20 DIAGNOSIS — G63 Polyneuropathy in diseases classified elsewhere: Secondary | ICD-10-CM | POA: Diagnosis not present

## 2023-08-20 DIAGNOSIS — E6609 Other obesity due to excess calories: Secondary | ICD-10-CM | POA: Diagnosis not present

## 2023-08-23 ENCOUNTER — Telehealth: Payer: Self-pay | Admitting: Nurse Practitioner

## 2023-08-23 NOTE — Telephone Encounter (Signed)
furosemide (LASIX) 40 MG tablet not on current list, routing for approval.

## 2023-08-23 NOTE — Telephone Encounter (Signed)
Patient was instructed to come in for labs with a hx of hyponatremia. She no-showed twice last month. Routing information to PCP to make her aware. I cannot approve Lasix at this time.

## 2023-08-23 NOTE — Telephone Encounter (Signed)
Medication Refill - Medication: furosemide (LASIX) 40 MG tablet   Has the patient contacted their pharmacy? Yes.     (Agent: If yes, when and what did the pharmacy advise?) Contact PCP   Preferred Pharmacy (with phone number or street name):  [id number] [place], [place] - [address]    Has the patient been seen for an appointment in the last year OR does the patient have an upcoming appointment? Yes.    Agent: Please be advised that RX refills may take up to 3 business days. We ask that you follow-up with your pharmacy.

## 2023-08-24 ENCOUNTER — Ambulatory Visit: Payer: Self-pay | Admitting: *Deleted

## 2023-08-24 NOTE — Telephone Encounter (Signed)
  Chief Complaint: Swollen feet, legs and hands - out of laxis Symptoms: above Frequency: ongoing Pertinent Negatives: Patient denies  Disposition: [] ED /[x] Urgent Care (no appt availability in office) / [] Appointment(In office/virtual)/ []  [place] [facility]/ [] Home Care/ [] Refused Recommended Disposition /[] [place] [facility]/ []  Follow-up with PCP Additional Notes: Pt called for refill of lasix. Provider unable to refill, as pt needs labs. Pt states that her legs are swollen up to her knees and her hands are also swollen.  Pt has been out of lasix for months. Made appt for Monday afternoon. Pt would like a small supply of lasix called in. Pt advised to go to UC for care over the weekend.   Summary: Swelling in feet and hands   Pt called reporting that she is experiencing swelling in her hands and feet. She is waiting for a refill of Lasix however:  "Drucilla Chalet, RPH-CPP   08/23/23 12:30 PM Note Patient was instructed to come in for labs with a hx of hyponatremia. She no-showed twice last month. Routing information to PCP to make her aware. I cannot approve Lasix at this time."     Reason for Disposition  [1] MODERATE leg swelling (e.g., swelling extends up to knees) AND [2] new-onset or worsening  Answer Assessment - Initial Assessment Questions 1. ONSET: "When did the swelling start?" (e.g., minutes, hours, days)     ongoing 2. LOCATION: "What part of the leg is swollen?"  "Are both legs swollen or just one leg?"     Both legs 3. SEVERITY: "How bad is the swelling?" (e.g., localized; mild, moderate, severe)   - Localized: Small area of swelling localized to one leg.   - MILD pedal edema: Swelling limited to foot and ankle, pitting edema < 1/4 inch (6 mm) deep, rest and elevation eliminate most or all swelling.   - MODERATE edema: Swelling of lower leg to knee, pitting edema > 1/4 inch (6 mm) deep, rest and elevation only partially reduce swelling.   - SEVERE edema:  Swelling extends above knee, facial or hand swelling present.      Moderate hands and legs are swollen 4. REDNESS: "Does the swelling look red or infected?"     no  6. FEVER: "Do you have a fever?" If Yes, ask: "What is it, how was it measured, and when did it start?"      no 8. MEDICAL HISTORY: "Do you have a history of blood clots (e.g., DVT), cancer, heart failure, kidney disease, or liver failure?"     no 9. RECURRENT SYMPTOM: "Have you had leg swelling before?" If Yes, ask: "When was the last time?" "What happened that time?"     Yes ongoing 10. OTHER SYMPTOMS: "Do you have any other symptoms?" (e.g., chest pain, difficulty breathing)       no  Protocols used: Leg Swelling and Edema-A-AH

## 2023-08-24 NOTE — Telephone Encounter (Signed)
Noted  

## 2023-08-24 NOTE — Telephone Encounter (Signed)
Summary: Swelling in feet and hands   Pt called reporting that she is experiencing swelling in her hands and feet. She is waiting for a refill of Lasix however:  "Mary Gray, RPH-CPP   08/23/23 12:30 PM Note Patient was instructed to come in for labs with a hx of hyponatremia. She no-showed twice last month. Routing information to PCP to make her aware. I cannot approve Lasix at this time."     Attempted to call patient- no answer- mailbox is full- unable to leave call back message

## 2023-08-25 NOTE — Telephone Encounter (Signed)
Denied. She has not followed up with any of the specialists I have referred her to nor has she returned for labs

## 2023-08-27 ENCOUNTER — Ambulatory Visit: Payer: Medicare HMO | Admitting: Nurse Practitioner

## 2023-09-04 ENCOUNTER — Other Ambulatory Visit: Payer: Self-pay | Admitting: Nurse Practitioner

## 2023-09-04 DIAGNOSIS — J40 Bronchitis, not specified as acute or chronic: Secondary | ICD-10-CM

## 2023-09-04 NOTE — Telephone Encounter (Signed)
Requested Prescriptions  Pending Prescriptions Disp Refills   fluticasone-salmeterol (WIXELA INHUB) 250-50 MCG/ACT AEPB [Pharmacy Med Name: WIXELA 250-50 INHUB] 60 each 0    Sig: INHALE 1 PUFF INTO THE LUNGS IN THE MORNING AND AT BEDTIME.     Pulmonology:  Combination Products Passed - 09/04/2023  1:35 AM      Passed - Valid encounter within last 12 months    Recent Outpatient Visits           1 month ago Uncontrolled type 2 diabetes mellitus with hyperglycemia [facility])   [place] [facility] [place], [place], [place]   4 months ago Uncontrolled type 2 diabetes mellitus with hyperglycemia [facility])   [place] [facility] [place], Shea Stakes, NP   7 months ago Chronic venous insufficiency   [place] [facility] [place], Shea Stakes, NP   8 months ago Primary hypertension   [place] [facility] Claiborne Rigg, NP   11 months ago Chronic constipation   [facility] [place], Marzella [place]lein, [place]       Future Appointments             In 2 weeks Sharon Seller, Marzella Schlein, PA-C [facility]

## 2023-09-05 ENCOUNTER — Other Ambulatory Visit: Payer: Self-pay | Admitting: Pharmacist

## 2023-09-05 DIAGNOSIS — E785 Hyperlipidemia, unspecified: Secondary | ICD-10-CM

## 2023-09-05 DIAGNOSIS — I10 Essential (primary) hypertension: Secondary | ICD-10-CM

## 2023-09-05 DIAGNOSIS — E1165 Type 2 diabetes mellitus with hyperglycemia: Secondary | ICD-10-CM

## 2023-09-05 MED ORDER — METFORMIN HCL ER 500 MG PO TB24
1000.0000 mg | ORAL_TABLET | Freq: Two times a day (BID) | ORAL | 0 refills | Status: DC
Start: 2023-09-05 — End: 2023-09-19

## 2023-09-05 MED ORDER — ENALAPRIL MALEATE 20 MG PO TABS
20.0000 mg | ORAL_TABLET | Freq: Every day | ORAL | 0 refills | Status: DC
Start: 2023-09-05 — End: 2023-12-24

## 2023-09-05 MED ORDER — ROSUVASTATIN CALCIUM 20 MG PO TABS
20.0000 mg | ORAL_TABLET | Freq: Every day | ORAL | 0 refills | Status: DC
Start: 2023-09-05 — End: 2024-01-03

## 2023-09-05 NOTE — Progress Notes (Signed)
Pharmacy Quality Measure Review  This patient is appearing on a report for being at risk of failing the adherence measure for cholesterol (statin), diabetes, and hypertension (ACEi/ARB) medications this calendar year.   Medication: enalapril Last fill date: 07/05/2023 for 90 day supply  Insurance report was not up to date. No action needed at this time.   Medication: metformin Last fill date: 07/13/2023 for 90 day supply  Insurance report was not up to date. No action needed at this time.   Medication: rosuvastatin Last fill date: 07/13/2023 for 90 day supply  Insurance report was not up to date. No action needed at this time.   Butch Penny, PharmD, [place]y [place], CPP Clinical Pharmacist [facility] [phone]

## 2023-09-06 ENCOUNTER — Ambulatory Visit (HOSPITAL_COMMUNITY)
Admission: RE | Admit: 2023-09-06 | Discharge: 2023-09-06 | Disposition: A | Discharge status: Home / Self Care | Payer: Medicare HMO | Source: Ambulatory Visit | Attending: Physician Assistant | Admitting: Physician Assistant

## 2023-09-06 ENCOUNTER — Encounter: Payer: Self-pay | Admitting: Vascular Surgery

## 2023-09-06 ENCOUNTER — Ambulatory Visit (INDEPENDENT_AMBULATORY_CARE_PROVIDER_SITE_OTHER): Payer: Medicare HMO | Admitting: Vascular Surgery

## 2023-09-06 VITALS — BP 132/81 | HR 89 | Temp 97.3°F | Ht 61.0 in | Wt 209.0 lb

## 2023-09-06 DIAGNOSIS — M7989 Other specified soft tissue disorders: Secondary | ICD-10-CM

## 2023-09-06 DIAGNOSIS — I872 Venous insufficiency (chronic) (peripheral): Secondary | ICD-10-CM | POA: Insufficient documentation

## 2023-09-06 DIAGNOSIS — I890 Lymphedema, not elsewhere classified: Secondary | ICD-10-CM
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
## 2023-09-09 ENCOUNTER — Other Ambulatory Visit: Payer: Self-pay | Admitting: Family Medicine

## 2023-09-09 DIAGNOSIS — G2581 Restless legs syndrome: Secondary | ICD-10-CM

## 2023-09-09 DIAGNOSIS — G894 Chronic pain syndrome: Secondary | ICD-10-CM

## 2023-09-09 DIAGNOSIS — M47816 Spondylosis without myelopathy or radiculopathy, lumbar region: Secondary | ICD-10-CM

## 2023-09-09 DIAGNOSIS — M51369 Other intervertebral disc degeneration, lumbar region without mention of lumbar back pain or lower extremity pain: Secondary | ICD-10-CM

## 2023-09-10 ENCOUNTER — Other Ambulatory Visit: Payer: Self-pay | Admitting: Nurse Practitioner

## 2023-09-10 ENCOUNTER — Ambulatory Visit: Payer: Medicare HMO | Admitting: Podiatry

## 2023-09-10 DIAGNOSIS — M51369 Other intervertebral disc degeneration, lumbar region without mention of lumbar back pain or lower extremity pain: Secondary | ICD-10-CM

## 2023-09-10 DIAGNOSIS — G894 Chronic pain syndrome: Secondary | ICD-10-CM

## 2023-09-10 DIAGNOSIS — G2581 Restless legs syndrome: Secondary | ICD-10-CM

## 2023-09-10 DIAGNOSIS — M47816 Spondylosis without myelopathy or radiculopathy, lumbar region: Secondary | ICD-10-CM

## 2023-09-10 NOTE — Telephone Encounter (Unsigned)
Copied from CRM [id number]. Topic: General - Other >> Sep 10, 2023  5:19 PM Mary Gray wrote: Reason for CRM: Medication Refill - Medication: cyclobenzaprine (FLEXERIL) 5 MG tablet [[id number]]  Has the patient contacted their pharmacy? Yes.   (Agent: If no, request that the patient contact the pharmacy for the refill. If patient does not wish to contact the pharmacy document the reason why and proceed with request.) (Agent: If yes, when and what did the pharmacy advise?)  Preferred Pharmacy (with phone number or street name): [id number] [place], [place] - [address] [place] [address] Phone: [phone] Fax: [phone] Hours: Not open 24 hours   Has the patient been seen for an appointment in the last year OR does the patient have an upcoming appointment? Yes.    Agent: Please be advised that RX refills may take up to 3 business days. We ask that you follow-up with your pharmacy.

## 2023-09-11 NOTE — Telephone Encounter (Signed)
Requested medication (s) are due for refill today: yes  Requested medication (s) are on the active medication list: yes  Last refill:  08/07/23  Future visit scheduled: yes  Notes to clinic:  Unable to refill per protocol, cannot delegate.      Requested Prescriptions  Pending Prescriptions Disp Refills   cyclobenzaprine (FLEXERIL) 5 MG tablet [Pharmacy Med Name: CYCLOBENZAPRINE 5 MG TABLET] 30 tablet 0    Sig: TAKE 1 TABLET BY MOUTH EVERYDAY AT BEDTIME     Not Delegated - Analgesics:  Muscle Relaxants Failed - 09/09/2023  2:24 PM      Failed - This refill cannot be delegated      Passed - Valid encounter within last 6 months    Recent Outpatient Visits           2 months ago Uncontrolled type 2 diabetes mellitus with hyperglycemia [facility])   [place] [facility] [place], [place], [place]   4 months ago Uncontrolled type 2 diabetes mellitus with hyperglycemia [facility])   [place] [facility] [place], Shea Stakes, NP   7 months ago Chronic venous insufficiency   [place] [facility] [place], Shea Stakes, NP   8 months ago Primary hypertension   [place] [facility] [place], Shea Stakes, NP   11 months ago Chronic constipation   [place] [facility] [place], Marzella Schlein, [place]       Future Appointments             In 1 week Sharon Seller, Marzella Schlein, PA-C [facility]

## 2023-09-11 NOTE — Telephone Encounter (Signed)
Requested medications are due for refill today.  no  Requested medications are on the active medications list.  yes  Last refill. 09/11/2023 #30 0 rf  Future visit scheduled.   yes  Notes to clinic.  Refill/refusal not delegated.    Requested Prescriptions  Pending Prescriptions Disp Refills   cyclobenzaprine (FLEXERIL) 5 MG tablet 30 tablet 0     Not Delegated - Analgesics:  Muscle Relaxants Failed - 09/10/2023  5:35 PM      Failed - This refill cannot be delegated      Passed - Valid encounter within last 6 months    Recent Outpatient Visits           2 months ago Uncontrolled type 2 diabetes mellitus with hyperglycemia [facility])   [place] [facility] [place], [place], [place]   4 months ago Uncontrolled type 2 diabetes mellitus with hyperglycemia [facility])   [place] [facility] [place], Shea Stakes, NP   7 months ago Chronic venous insufficiency   [place] [facility] [place], Shea Stakes, NP   8 months ago Primary hypertension   [place] [facility] [place], Shea Stakes, NP   11 months ago Chronic constipation   [place] [facility] [place], Marzella Schlein, [place]       Future Appointments             In 1 week Sharon Seller, Marzella Schlein, PA-C [facility]

## 2023-09-12 ENCOUNTER — Ambulatory Visit: Payer: Medicare HMO | Admitting: Podiatry

## 2023-09-13 ENCOUNTER — Other Ambulatory Visit: Payer: Self-pay | Admitting: Nurse Practitioner

## 2023-09-13 DIAGNOSIS — K219 Gastro-esophageal reflux disease without esophagitis: Secondary | ICD-10-CM

## 2023-09-14 NOTE — Telephone Encounter (Signed)
Requested Prescriptions  Pending Prescriptions Disp Refills   esomeprazole (NEXIUM) 40 MG capsule [Pharmacy Med Name: ESOMEPRAZOLE MAG DR 40 MG CAP] 90 capsule 1    Sig: TAKE 1 CAPSULE (40 MG TOTAL) BY MOUTH DAILY.     Gastroenterology: Proton Pump Inhibitors 2 Passed - 09/13/2023  1:41 AM      Passed - ALT in normal range and within 360 days    ALT  Date Value Ref Range Status  05/02/2023 23 0 - 32 IU/L Final         Passed - AST in normal range and within 360 days    AST  Date Value Ref Range Status  05/02/2023 34 0 - 40 IU/L Final         Passed - Valid encounter within last 12 months    Recent Outpatient Visits           2 months ago Uncontrolled type 2 diabetes mellitus with hyperglycemia [facility])   [place] [facility] [place], [place], [place]   4 months ago Uncontrolled type 2 diabetes mellitus with hyperglycemia [facility])   [place] [facility] [place], Shea Stakes, NP   7 months ago Chronic venous insufficiency   [place] [facility] [place], Shea Stakes, NP   8 months ago Primary hypertension   [place] [facility] [place], Shea Stakes, NP   11 months ago Chronic constipation   [place] [facility] [place], Marzella Schlein, [place]       Future Appointments             In 5 days Sharon Seller, Marzella Schlein, PA-C [facility]

## 2023-09-19 ENCOUNTER — Ambulatory Visit: Payer: Medicare HMO | Attending: Nurse Practitioner | Admitting: Physician Assistant

## 2023-09-19 ENCOUNTER — Encounter: Payer: Self-pay | Admitting: Physician Assistant

## 2023-09-19 VITALS — BP 156/84 | HR 104 | Wt 204.6 lb

## 2023-09-19 DIAGNOSIS — Z7984 Long term (current) use of oral hypoglycemic drugs: Secondary | ICD-10-CM

## 2023-09-19 DIAGNOSIS — Z6835 Body mass index (BMI) 35.0-35.9, adult: Secondary | ICD-10-CM | POA: Diagnosis not present

## 2023-09-19 DIAGNOSIS — Z7985 Long-term (current) use of injectable non-insulin antidiabetic drugs: Secondary | ICD-10-CM

## 2023-09-19 DIAGNOSIS — E034 Atrophy of thyroid (acquired): Secondary | ICD-10-CM | POA: Diagnosis not present

## 2023-09-19 DIAGNOSIS — G63 Polyneuropathy in diseases classified elsewhere: Secondary | ICD-10-CM | POA: Diagnosis not present

## 2023-09-19 DIAGNOSIS — R030 Elevated blood-pressure reading, without diagnosis of hypertension: Secondary | ICD-10-CM | POA: Diagnosis not present

## 2023-09-19 DIAGNOSIS — E1165 Type 2 diabetes mellitus with hyperglycemia: Secondary | ICD-10-CM

## 2023-09-19 DIAGNOSIS — M51369 Other intervertebral disc degeneration, lumbar region without mention of lumbar back pain or lower extremity pain: Secondary | ICD-10-CM | POA: Diagnosis not present

## 2023-09-19 DIAGNOSIS — E119 Type 2 diabetes mellitus without complications: Secondary | ICD-10-CM | POA: Diagnosis not present

## 2023-09-19 DIAGNOSIS — E6609 Other obesity due to excess calories: Secondary | ICD-10-CM | POA: Diagnosis not present

## 2023-09-19 DIAGNOSIS — Z6836 Body mass index (BMI) 36.0-36.9, adult: Secondary | ICD-10-CM | POA: Diagnosis not present

## 2023-09-19 LAB — GLUCOSE, POCT (MANUAL RESULT ENTRY): POC Glucose: 200 mg/dL — AB (ref 70–99)

## 2023-09-19 LAB — POCT GLYCOSYLATED HEMOGLOBIN (HGB A1C): HbA1c, POC (controlled diabetic range): 7.3 % — AB (ref 0.0–7.0)

## 2023-09-19 MED ORDER — LEVOTHYROXINE SODIUM 88 MCG PO TABS: 88.0000 ug | ORAL_TABLET | Freq: Every day | ORAL | 2 refills | Status: DC

## 2023-09-19 MED ORDER — METFORMIN HCL ER 500 MG PO TB24
1000.0000 mg | ORAL_TABLET | Freq: Two times a day (BID) | ORAL | 0 refills | Status: DC
Start: 2023-09-19 — End: 2024-07-24

## 2023-09-19 MED ORDER — SEMAGLUTIDE(0.25 OR 0.5MG/DOS) 2 MG/3ML ~~LOC~~ SOPN: 0.2500 mg | PEN_INJECTOR | SUBCUTANEOUS | 0 refills | Status: DC

## 2023-09-19 NOTE — Progress Notes (Signed)
Patient ID: Mary Gray, female   DOB: [birth year], 62 y.o.   MRN: [id number]   Mary Gray, is a 62 y.o. female  [id number]:[id number]  [id number]  DOB - Feb 25, 1961  Chief Complaint  Patient presents with   Medical Management of Chronic Issues       Subjective:   Mary Gray is a 62 y.o. female here today for med RF.  Diabetes has worsened.  She would like to try ozempic.  She has been trying to change her diet and says "I just don't have the will power."  She is still smoking.  She does want to get her flu shot today.  Says Bp at home is usu 120-130/80-85.  Denies HA/CP/dizziness.  Not checking blood sugars but does have meter.    No problems updated.  ALLERGIES: Allergies  Allergen Reactions   Amoxicillin Other (See Comments)   Codeine Sulfate Other (See Comments)    Confusion, anxiety, fatigue   Meperidine And Related Nausea And Vomiting   Minocycline Hcl Nausea And Vomiting    Hospitalized for vomiting along with allergic reaction   Penicillins Other (See Comments)    Has patient had a PCN reaction causing immediate rash, facial/tongue/throat swelling, SOB or lightheadedness with hypotension: Yes Has patient had a PCN reaction causing severe rash involving mucus membranes or skin necrosis: Yes Has patient had a PCN reaction that required hospitalization Yes Has patient had a PCN reaction occurring within the last 10 years: Yes If all of the above answers are "NO", then may proceed with Cephalosporin use.    Propoxyphene N-Acetaminophen Other (See Comments)    confused   Tetracycline Hcl Other (See Comments)    Vomiting Note: Patient took a course of doxycycline in June 2014 without any problems.    PAST MEDICAL HISTORY: Past Medical History:  Diagnosis Date   Asthma    Back pain    Chronic interstitial cystitis    Followed by Dr. Marcine Matar   COPD (chronic obstructive pulmonary disease) (HCC)    DDD (degenerative disc disease), lumbar    Degeneration of  cervical intervertebral disc    Depression    Diabetes mellitus, type 2 (HCC) 2007   Heme positive stool    EGD on 04/08/2010 by Dr. Ewing Schlein showed chronic gastritis and a few gastric polyps; pathology showed a fundic gland polyp.  Colonoscopy on 04/08/2010 showed small external and internal hemorrhoids, and a few benign-appearing diminutive polyps in the rectum, the distal sigmoid colon, and in the distal descending colon; pathology showed hyperplastic polyps.    Hypertension    Hypothyroidism    Iron deficiency anemia    Peripheral arterial disease (HCC)    Spinal stenosis     MEDICATIONS AT HOME: Prior to Admission medications   Medication Sig Start Date End Date Taking? Authorizing Provider  Semaglutide,0.25 or 0.5MG /DOS, 2 MG/3ML SOPN Inject 0.25 mg into the skin once a week. 09/19/23  Yes Anders Simmonds, PA-C  Accu-Chek Softclix Lancets lancets Check blood sugar 1 time per day 07/12/20   Tyson Alias, MD  acetaminophen (TYLENOL) 500 MG tablet Take 1,000 mg by mouth every 6 (six) hours as needed for mild pain.    [provider]  albuterol (VENTOLIN HFA) 108 (90 Base) MCG/ACT inhaler Inhale 2 puffs into the lungs every 4 (four) hours as needed for wheezing or shortness of breath. 07/17/23   Zenia Resides, MD  amitriptyline (ELAVIL) 25 MG tablet TAKE 1 TABLET BY MOUTH EVERYDAY AT BEDTIME  07/17/23   Hoy Register, MD  amLODipine (NORVASC) 2.5 MG tablet Take 1 tablet (2.5 mg total) by mouth daily. 06/19/23   Nahser, Deloris Ping, MD  aspirin EC 81 MG tablet Take 81 mg by mouth daily. Swallow whole.    [provider]  [facility] VITAMIN B12 1000 MCG tablet TAKE 1 TABLET BY MOUTH EVERY DAY Patient taking differently: Take 1,000 mcg by mouth daily. 01/12/21   Tyson Alias, MD  cyclobenzaprine (FLEXERIL) 5 MG tablet TAKE 1 TABLET BY MOUTH EVERYDAY AT BEDTIME 09/11/23   Hoy Register, MD  enalapril (VASOTEC) 20 MG tablet Take 1 tablet (20 mg total) by mouth  daily. 09/05/23   Hoy Register, MD  esomeprazole (NEXIUM) 40 MG capsule TAKE 1 CAPSULE (40 MG TOTAL) BY MOUTH DAILY. 09/14/23 03/12/24  Claiborne Rigg, NP  fluticasone-salmeterol [facility] INHUB) 250-50 MCG/ACT AEPB INHALE 1 PUFF INTO THE LUNGS IN THE MORNING AND AT BEDTIME. 09/04/23   Claiborne Rigg, NP  gabapentin (NEURONTIN) 600 MG tablet TAKE 1 TABLET BY MOUTH THREE TIMES A DAY 04/25/23   Claiborne Rigg, NP  glucose blood (ACCU-CHEK GUIDE) test strip CHECK BLOOD SUGAR 1 TIME PER DAY 05/12/21   Tyson Alias, MD  ipratropium-albuterol (DUONEB) 0.5-2.5 (3) MG/3ML SOLN Take 3 mLs by nebulization every 4 (four) hours as needed. 07/13/23   Claiborne Rigg, NP  levocetirizine (XYZAL) 5 MG tablet Take 1 tablet (5 mg total) by mouth every evening. 03/23/23   Wallis Bamberg, PA-C  levothyroxine (SYNTHROID) 88 MCG tablet Take 1 tablet (88 mcg total) by mouth daily before breakfast. 09/19/23   Anders Simmonds, PA-C  meloxicam (MOBIC) 7.5 MG tablet TAKE 1 TABLET BY MOUTH EVERY DAY 07/24/23   Claiborne Rigg, NP  metFORMIN (GLUCOPHAGE-XR) 500 MG 24 hr tablet Take 2 tablets (1,000 mg total) by mouth 2 (two) times daily. 09/19/23   Anders Simmonds, PA-C  Misc. Devices MISC Provide patient with shower chair and grab bar. ICD10 M17.10 M54.16 03/03/23   Claiborne Rigg, NP  multivitamin-iron-minerals-folic acid (CENTRUM) chewable tablet Chew 1 tablet by mouth daily. 07/12/20   Tyson Alias, MD  naloxegol oxalate (MOVANTIK) 25 MG TABS tablet TAKE 1 TABLET (25 MG TOTAL) BY MOUTH DAILY. 06/19/23   Claiborne Rigg, NP  nystatin (NYSTATIN) powder Apply 1 application topically 3 (three) times daily. 03/10/21   [name], MD  nystatin cream (MYCOSTATIN) Apply 1 Application topically 2 (two) times daily. 01/03/23   Claiborne Rigg, NP  ondansetron (ZOFRAN-ODT) 4 MG disintegrating tablet Take 1 tablet (4 mg total) by mouth every 8 (eight) hours as needed for nausea or vomiting. 08/08/23   Hoy Register, MD  PREMARIN vaginal cream PLACE 1 APPLICATORFUL VAGINALLY DAILY. Patient taking differently: 1 applicator 2 (two) times a week. 11/23/21   Tyson Alias, MD  rosuvastatin (CRESTOR) 20 MG tablet Take 1 tablet (20 mg total) by mouth daily. 09/05/23   Hoy Register, MD  Tiotropium Bromide Monohydrate (SPIRIVA RESPIMAT) 2.5 MCG/ACT AERS Inhale 2 puffs into the lungs daily. 12/30/22   Allwardt, Crist Infante, PA-C  traMADol (ULTRAM) 50 MG tablet Take 50 mg by mouth every 6 (six) hours as needed for moderate pain. Patient not taking: Reported on 09/06/2023    [provider]  triamcinolone (KENALOG) 0.025 % ointment APPLY TO AFFECTED AREA TWICE A DAY 05/22/23   Hoy Register, MD  zolpidem (AMBIEN) 5 MG tablet TAKE 1 TABLET BY MOUTH EVERYDAY AT BEDTIME 08/07/23  Claiborne Rigg, NP    ROS: Neg HEENT Neg resp Neg cardiac Neg GI Neg GU Neg MS Neg psych Neg neuro  Objective:   Vitals:   09/19/23 1603  BP: (!) 156/84  Pulse: (!) 104  SpO2: 98%  Weight: 204 lb 9.6 oz (92.8 kg)   Exam General appearance : Awake, alert, not in any distress. Speech Clear. Not toxic looking HEENT: Atraumatic and Normocephalic Neck: Supple, no JVD. No cervical lymphadenopathy.  Chest: Good air entry bilaterally, CTAB.  No rales/rhonchi/wheezing CVS: S1 S2 regular, no murmurs.  Extremities: B/L Lower Ext shows no edema, both legs are warm to touch Neurology: Awake alert, and oriented X 3, CN II-XII intact, Non focal Skin: No Rash  Data Review Lab Results  Component Value Date   HGBA1C 7.0 09/19/2023   HGBA1C 6.9 05/02/2023   HGBA1C 6.5 (H) 10/04/2022    Assessment & Plan   1. Type 2 diabetes mellitus with hyperglycemia, unspecified whether long term insulin use (HCC) A1c=7.3 today(entered as 7 but was 7.3) - Glucose (CBG) - HgB A1c - Comprehensive metabolic panel  2. Uncontrolled type 2 diabetes mellitus with hyperglycemia (HCC) Try ozempic - metFORMIN (GLUCOPHAGE-XR)  500 MG 24 hr tablet; Take 2 tablets (1,000 mg total) by mouth 2 (two) times daily.  Dispense: 360 tablet; Refill: 0 - Semaglutide,0.25 or 0.5MG /DOS, 2 MG/3ML SOPN; Inject 0.25 mg into the skin once a week.  Dispense: 3 mL; Refill: 0 - Comprehensive metabolic panel  3. Hypothyroidism due to acquired atrophy of thyroid - levothyroxine (SYNTHROID) 88 MCG tablet; Take 1 tablet (88 mcg total) by mouth daily before breakfast.  Dispense: 90 tablet; Refill: 2 - Thyroid Panel With TSH  Smoking and dangers of nicotine have been discussed at length. Long term health consequences of smoking reviewed in detail.  Methods for helping with cessation have been reviewed.  Patient expresses understanding.   Return in about 4 weeks (around 10/17/2023) for [facility] for diabetes and increase dose ozempic if appropriate.  The patient was given clear instructions to go to ER or return to medical center if symptoms don't improve, worsen or new problems develop. The patient verbalized understanding. The patient was told to call to get lab results if they haven't heard anything in the next week.      Georgian Co, PA-C [facility] [place], [place] [phone]   09/19/2023, 5:21 PM

## 2023-09-20 ENCOUNTER — Other Ambulatory Visit: Payer: Self-pay | Admitting: Nurse Practitioner

## 2023-09-20 DIAGNOSIS — K5909 Other constipation: Secondary | ICD-10-CM

## 2023-09-20 LAB — COMPREHENSIVE METABOLIC PANEL
ALT: 26 [IU]/L (ref 0–32)
AST: 37 [IU]/L (ref 0–40)
Albumin: 4.7 g/dL (ref 3.9–4.9)
Alkaline Phosphatase: 67 [IU]/L (ref 44–121)
BUN/Creatinine Ratio: 18 (ref 12–28)
BUN: 12 mg/dL (ref 8–27)
Bilirubin Total: 0.2 mg/dL (ref 0.0–1.2)
CO2: 24 mmol/L (ref 20–29)
Calcium: 10.1 mg/dL (ref 8.7–10.3)
Chloride: 94 mmol/L — ABNORMAL LOW (ref 96–106)
Creatinine, Ser: 0.66 mg/dL (ref 0.57–1.00)
Globulin, Total: 2.5 g/dL (ref 1.5–4.5)
Glucose: 157 mg/dL — ABNORMAL HIGH (ref 70–99)
Potassium: 4.3 mmol/L (ref 3.5–5.2)
Sodium: 135 mmol/L (ref 134–144)
Total Protein: 7.2 g/dL (ref 6.0–8.5)
eGFR: 99 mL/min/{1.73_m2} (ref >59)

## 2023-09-20 LAB — THYROID PANEL WITH TSH
Free Thyroxine Index: 2.5 (ref 1.2–4.9)
T3 Uptake Ratio: 26 % (ref 24–39)
T4, Total: 9.5 ug/dL (ref 4.5–12.0)
TSH: 1.72 u[IU]/mL (ref 0.450–4.500)

## 2023-09-20 NOTE — Telephone Encounter (Signed)
Requested Prescriptions  Pending Prescriptions Disp Refills   naloxegol oxalate (MOVANTIK) 25 MG TABS tablet [Pharmacy Med Name: MOVANTIK 25 MG TABLET] 90 tablet 3    Sig: TAKE 1 TABLET (25 MG TOTAL) BY MOUTH DAILY.     Gastroenterology: Opioid-Induced Constipation - naloxegol Passed - 09/20/2023  2:04 PM      Passed - Cr in normal range and within 360 days    Creat  Date Value Ref Range Status  02/09/2015 0.55 0.50 - 1.10 mg/dL Final   Creatinine, Ser  Date Value Ref Range Status  09/19/2023 0.66 0.57 - 1.00 mg/dL Final   Creatinine,U  Date Value Ref Range Status  04/29/2010 71.9 mg/dL Final   Creatinine, Urine  Date Value Ref Range Status  03/22/2015 27.33 >20.0 mg/dL Final    Comment:    * (PPS) Presumptive positive screen result to be verified by         quantitative LC/MS or GC/MS confirmation testing.          Passed - Valid encounter within last 12 months    Recent Outpatient Visits           Yesterday Type 2 diabetes mellitus with hyperglycemia, unspecified whether long term insulin use [facility])   [place] [facility] [place], [place], [place]   2 months ago Uncontrolled type 2 diabetes mellitus with hyperglycemia [facility])   [facility] [place], [place], [place]   4 months ago Uncontrolled type 2 diabetes mellitus with hyperglycemia [facility])   [place] [facility] [place], Shea Stakes, NP   8 months ago Chronic venous insufficiency   [place] [facility] [place], Shea Stakes, NP   8 months ago Primary hypertension   [facility] [place], Shea Stakes, NP

## 2023-09-25 ENCOUNTER — Ambulatory Visit: Payer: Self-pay

## 2023-09-25 NOTE — Telephone Encounter (Signed)
Call placed to patient unable to reach message VM full.

## 2023-09-25 NOTE — Telephone Encounter (Signed)
Summary: tired and no energy   Patient called stated she has been tired and low to no energy. She got her flu shot last Wednesday and that's when it all started. Please f/u with patient     Chief Complaint: Nausea, fatigue "since I got my flu shot last week." Asking for medication for nausea and lab results. Symptoms: Above Frequency: Last week Pertinent Negatives: Patient denies fever Disposition: [] ED /[] Urgent Care (no appt availability in office) / [] Appointment(In office/virtual)/ []  [place] [facility]/ [] Home Care/ [] Refused Recommended Disposition /[] [place] Mobile [facility]/ [x]  Follow-up with PCP Additional Notes: Please advise pt.  Reason for Disposition  Taking prescription medication that could cause nausea (e.g., narcotics/opiates, antibiotics, OCPs, many others)  Answer Assessment - Initial Assessment Questions 1. NAUSEA SEVERITY: "How bad is the nausea?" (e.g., mild, moderate, severe; dehydration, weight loss)   - MILD: loss of appetite without change in eating habits   - MODERATE: decreased oral intake without significant weight loss, dehydration, or malnutrition   - SEVERE: inadequate caloric or fluid intake, significant weight loss, symptoms of dehydration     Moderate 2. ONSET: "When did the nausea begin?"     After flu shot 3. VOMITING: "Any vomiting?" If Yes, ask: "How many times today?"     No 4. RECURRENT SYMPTOM: "Have you had nausea before?" If Yes, ask: "When was the last time?" "What happened that time?"     No 5. CAUSE: "What do you think is causing the nausea?"     Maybe flu shot 6. PREGNANCY: "Is there any chance you are pregnant?" (e.g., unprotected intercourse, missed birth control pill, broken condom)     No  Protocols used: Nausea-A-AH

## 2023-09-26 ENCOUNTER — Ambulatory Visit (INDEPENDENT_AMBULATORY_CARE_PROVIDER_SITE_OTHER): Payer: Medicare HMO | Admitting: Podiatry

## 2023-09-26 ENCOUNTER — Ambulatory Visit: Payer: Self-pay | Admitting: *Deleted

## 2023-09-26 DIAGNOSIS — M79674 Pain in right toe(s): Secondary | ICD-10-CM | POA: Diagnosis not present

## 2023-09-26 DIAGNOSIS — B351 Tinea unguium: Secondary | ICD-10-CM

## 2023-09-26 DIAGNOSIS — E1140 Type 2 diabetes mellitus with diabetic neuropathy, unspecified: Secondary | ICD-10-CM | POA: Diagnosis not present

## 2023-09-26 DIAGNOSIS — M79675 Pain in left toe(s): Secondary | ICD-10-CM

## 2023-09-26 DIAGNOSIS — G2581 Restless legs syndrome: Secondary | ICD-10-CM | POA: Diagnosis not present

## 2023-09-26 MED ORDER — ROPINIROLE HCL 0.25 MG PO TABS
2.0000 mg | ORAL_TABLET | Freq: Every day | ORAL | Status: DC
Start: 2023-09-26 — End: 2024-04-21

## 2023-09-26 NOTE — Telephone Encounter (Signed)
  Chief Complaint: extreme fatigue no energy since taking flu shot 09/19/23 requesting B 12 injection and zofran refill Symptoms: nausea , no energy , fatigue, weakness . Can walk. Eating and drinking plenty . Drinking powerade zero and water.  Frequency: since 09/19/23 Pertinent Negatives: Patient denies fever no chest pain no difficulty breathing  Disposition: [] ED /[] Urgent Care (no appt availability in office) / [] Appointment(In office/virtual)/ []  [place] [facility]/ [] Home Care/ [x] Refused Recommended Disposition /[] [place] [facility]/ []  Follow-up with PCP Additional Notes:    No available appt . Recommended mobile unit. Due to location of bus today unable to go . At another dr appt. Please  advise. Patient reports she does not want to start Ozempic until she is feeling better . Report BS not an issue at this time. Did not give latest value.   Summary: Energy levels unusually low since flu shot, B12 injection request   Pt called to report that her energy is very low, abnormally low ever since her flu shot last Wednesday. Says she wants to receive a B12 injection   Best contact: [phone]            Reason for Disposition  [1] MODERATE weakness (i.e., interferes with work, school, normal activities) AND [2] persists > 3 days  Answer Assessment - Initial Assessment Questions 1. DESCRIPTION: "Describe how you are feeling."     Extreme fatigue since getting flu shot 09/19/23 2. SEVERITY: "How bad is it?"  "Can you stand and walk?"   - MILD (0-3): Feels weak or tired, but does not interfere with work, school or normal activities.   - MODERATE (4-7): Able to stand and walk; weakness interferes with work, school, or normal activities.   - SEVERE (8-10): Unable to stand or walk; unable to do usual activities.     Moderate , can walk but very weak  3. ONSET: "When did these symptoms begin?" (e.g., hours, days, weeks, months)     After getting flu shot  4. CAUSE: "What do  you think is causing the weakness or fatigue?" (e.g., not drinking enough fluids, medical problem, trouble sleeping)     Flu shot  5. NEW MEDICINES:  "Have you started on any new medicines recently?" (e.g., opioid pain medicines, benzodiazepines, muscle relaxants, antidepressants, antihistamines, neuroleptics, beta blockers)     Flu shot  . No new medication  6. OTHER SYMPTOMS: "Do you have any other symptoms?" (e.g., chest pain, fever, cough, SOB, vomiting, diarrhea, bleeding, other areas of pain)     Nausea, able to eat and drink ,dizziness, weakness  7. PREGNANCY: "Is there any chance you are pregnant?" "When was your last menstrual period?"     na  Protocols used: Weakness (Generalized) and Fatigue-A-AH

## 2023-09-26 NOTE — Progress Notes (Signed)
  Subjective:  Patient ID: Mary Gray, female    DOB: October 30, 1961,   MRN: [id number]  No chief complaint on file.   62 y.o. female presents for concern of thickened elongated and painful nails that are difficult to trim. Requesting to have them trimmed today. Relates burning and tingling in their feet. Patient is diabetic and last A1c was  Lab Results  Component Value Date   HGBA1C 7.3 (A) 09/19/2023   .  Relates she is taking gabapentin for neuropathy with some help. Relates restless leg and has had requip in the past that has helped and hoping to try that again.   PCP:  Claiborne Rigg, NP    . Denies any other pedal complaints. Denies n/v/f/c.   Past Medical History:  Diagnosis Date   Asthma    Back pain    Chronic interstitial cystitis    Followed by Dr. Marcine Matar   COPD (chronic obstructive pulmonary disease) (HCC)    DDD (degenerative disc disease), lumbar    Degeneration of cervical intervertebral disc    Depression    Diabetes mellitus, type 2 (HCC) 2007   Heme positive stool    EGD on 04/08/2010 by Dr. Ewing Schlein showed chronic gastritis and a few gastric polyps; pathology showed a fundic gland polyp.  Colonoscopy on 04/08/2010 showed small external and internal hemorrhoids, and a few benign-appearing diminutive polyps in the rectum, the distal sigmoid colon, and in the distal descending colon; pathology showed hyperplastic polyps.    Hypertension    Hypothyroidism    Iron deficiency anemia    Peripheral arterial disease (HCC)    Spinal stenosis     Objective:  Physical Exam: Vascular: DP/PT pulses 2/4 bilateral. CFT <3 seconds. Absent hair growth on digits. Edema noted to bilateral lower extremities. Xerosis noted bilaterally.  Skin. No lacerations or abrasions bilateral feet. Nails 1-5 bilateral  are thickened discolored and elongated with subungual debris.  Musculoskeletal: MMT 5/5 bilateral lower extremities in DF, PF, Inversion and Eversion. Deceased ROM in  DF of ankle joint.  Neurological: Sensation intact to light touch. Protective sensation diminished bilateral.    Assessment:   1. Pain due to onychomycosis of toenails of both feet   2. Restless leg   3. Controlled type 2 diabetes with neuropathy (HCC)      Plan:  Patient was evaluated and treated and all questions answered. -Discussed and educated patient on diabetic foot care, especially with  regards to the vascular, neurological and musculoskeletal systems.  -Stressed the importance of good glycemic control and the detriment of not  controlling glucose levels in relation to the foot. -Discussed supportive shoes at all times and checking feet regularly.  -Mechanically debrided all nails 1-5 bilateral using sterile nail nipper and filed with dremel without incident Requip 2 mg sent to pharmacy  -Answered all patient questions -Patient to return  in 3 months for at risk foot care -Patient advised to call the office if any problems or questions arise in the meantime.   Louann Sjogren, DPM

## 2023-09-27 ENCOUNTER — Ambulatory Visit: Payer: Medicare HMO | Attending: Physician Assistant | Admitting: Physician Assistant

## 2023-09-27 ENCOUNTER — Encounter: Payer: Self-pay | Admitting: Physician Assistant

## 2023-09-27 VITALS — BP 137/81 | HR 93 | Wt 193.2 lb

## 2023-09-27 DIAGNOSIS — Z8639 Personal history of other endocrine, nutritional and metabolic disease: Secondary | ICD-10-CM | POA: Diagnosis not present

## 2023-09-27 DIAGNOSIS — R110 Nausea: Secondary | ICD-10-CM

## 2023-09-27 DIAGNOSIS — R5383 Other fatigue: Secondary | ICD-10-CM

## 2023-09-27 DIAGNOSIS — D508 Other iron deficiency anemias: Secondary | ICD-10-CM | POA: Diagnosis not present

## 2023-09-27 MED ORDER — ONDANSETRON 4 MG PO TBDP
4.0000 mg | ORAL_TABLET | Freq: Three times a day (TID) | ORAL | 1 refills | Status: DC | PRN
Start: 2023-09-27 — End: 2024-01-29

## 2023-09-27 NOTE — Progress Notes (Signed)
Patient ID: Mary Gray, female   DOB: [birth year], 62 y.o.   MRN: [id number]   Mary Gray, is a 62 y.o. female  [id number]  [id number]  DOB - 18-Feb-1961  Chief Complaint  Patient presents with   Fatigue   Nausea       Subjective:   Mary Gray is a 62 y.o. female here today for nausea and severe fatigue since having her flu shot 1 week ago.  She is wanting to check vitamin levels.  No vomiting.  She is requesting Zofran ODT.  No fever.  No URI s/sx.  No abdominal pain.  No UTI s/sx.  Denies melena or hematochezia or changes in BM.  She did NOT start ozempic yet bc she was feeling bad and didn't want to feel even worse.      No problems updated.  ALLERGIES: Allergies  Allergen Reactions   Amoxicillin Other (See Comments)   Codeine Sulfate Other (See Comments)    Confusion, anxiety, fatigue   Meperidine And Related Nausea And Vomiting   Minocycline Hcl Nausea And Vomiting    Hospitalized for vomiting along with allergic reaction   Penicillins Other (See Comments)    Has patient had a PCN reaction causing immediate rash, facial/tongue/throat swelling, SOB or lightheadedness with hypotension: Yes Has patient had a PCN reaction causing severe rash involving mucus membranes or skin necrosis: Yes Has patient had a PCN reaction that required hospitalization Yes Has patient had a PCN reaction occurring within the last 10 years: Yes If all of the above answers are "NO", then may proceed with Cephalosporin use.    Propoxyphene N-Acetaminophen Other (See Comments)    confused   Tetracycline Hcl Other (See Comments)    Vomiting Note: Patient took a course of doxycycline in June 2014 without any problems.    PAST MEDICAL HISTORY: Past Medical History:  Diagnosis Date   Asthma    Back pain    Chronic interstitial cystitis    Followed by Dr. Marcine Matar   COPD (chronic obstructive pulmonary disease) (HCC)    DDD (degenerative disc disease), lumbar     Degeneration of cervical intervertebral disc    Depression    Diabetes mellitus, type 2 (HCC) 2007   Heme positive stool    EGD on 04/08/2010 by Dr. Ewing Schlein showed chronic gastritis and a few gastric polyps; pathology showed a fundic gland polyp.  Colonoscopy on 04/08/2010 showed small external and internal hemorrhoids, and a few benign-appearing diminutive polyps in the rectum, the distal sigmoid colon, and in the distal descending colon; pathology showed hyperplastic polyps.    Hypertension    Hypothyroidism    Iron deficiency anemia    Peripheral arterial disease (HCC)    Spinal stenosis     MEDICATIONS AT HOME: Prior to Admission medications   Medication Sig Start Date End Date Taking? Authorizing Provider  Accu-Chek Softclix Lancets lancets Check blood sugar 1 time per day 07/12/20  Yes Tyson Alias, MD  acetaminophen (TYLENOL) 500 MG tablet Take 1,000 mg by mouth every 6 (six) hours as needed for mild pain.   Yes [provider]  albuterol (VENTOLIN HFA) 108 (90 Base) MCG/ACT inhaler Inhale 2 puffs into the lungs every 4 (four) hours as needed for wheezing or shortness of breath. 07/17/23  Yes Banister, Janace Aris, MD  amitriptyline (ELAVIL) 25 MG tablet TAKE 1 TABLET BY MOUTH EVERYDAY AT BEDTIME 07/17/23  Yes Newlin, Enobong, MD  amLODipine (NORVASC) 2.5 MG tablet Take 1 tablet (2.5  mg total) by mouth daily. 06/19/23  Yes Nahser, Deloris Ping, MD  aspirin EC 81 MG tablet Take 81 mg by mouth daily. Swallow whole.   Yes [provider]  [facility] VITAMIN B12 1000 MCG tablet TAKE 1 TABLET BY MOUTH EVERY DAY Patient taking differently: Take 1,000 mcg by mouth daily. 01/12/21  Yes Tyson Alias, MD  cyclobenzaprine (FLEXERIL) 5 MG tablet TAKE 1 TABLET BY MOUTH EVERYDAY AT BEDTIME 09/11/23  Yes Newlin, Enobong, MD  enalapril (VASOTEC) 20 MG tablet Take 1 tablet (20 mg total) by mouth daily. 09/05/23  Yes Newlin, Odette Horns, MD  esomeprazole (NEXIUM) 40 MG capsule TAKE 1  CAPSULE (40 MG TOTAL) BY MOUTH DAILY. 09/14/23 03/12/24 Yes Claiborne Rigg, NP  fluticasone-salmeterol [facility] INHUB) 250-50 MCG/ACT AEPB INHALE 1 PUFF INTO THE LUNGS IN THE MORNING AND AT BEDTIME. 09/04/23  Yes Claiborne Rigg, NP  gabapentin (NEURONTIN) 600 MG tablet TAKE 1 TABLET BY MOUTH THREE TIMES A DAY 04/25/23  Yes Claiborne Rigg, NP  glucose blood (ACCU-CHEK GUIDE) test strip CHECK BLOOD SUGAR 1 TIME PER DAY 05/12/21  Yes Tyson Alias, MD  ipratropium-albuterol (DUONEB) 0.5-2.5 (3) MG/3ML SOLN Take 3 mLs by nebulization every 4 (four) hours as needed. 07/13/23  Yes Claiborne Rigg, NP  levocetirizine (XYZAL) 5 MG tablet Take 1 tablet (5 mg total) by mouth every evening. 03/23/23  Yes Wallis Bamberg, PA-C  levothyroxine (SYNTHROID) 88 MCG tablet Take 1 tablet (88 mcg total) by mouth daily before breakfast. 09/19/23  Yes Bo Rogue M, PA-C  meloxicam (MOBIC) 7.5 MG tablet TAKE 1 TABLET BY MOUTH EVERY DAY 07/24/23  Yes Claiborne Rigg, NP  metFORMIN (GLUCOPHAGE-XR) 500 MG 24 hr tablet Take 2 tablets (1,000 mg total) by mouth 2 (two) times daily. 09/19/23  Yes Anders Simmonds, PA-C  Misc. Devices MISC Provide patient with shower chair and grab bar. ICD10 M17.10 M54.16 03/03/23  Yes Claiborne Rigg, NP  multivitamin-iron-minerals-folic acid (CENTRUM) chewable tablet Chew 1 tablet by mouth daily. 07/12/20  Yes Tyson Alias, MD  naloxegol oxalate (MOVANTIK) 25 MG TABS tablet TAKE 1 TABLET (25 MG TOTAL) BY MOUTH DAILY. 09/20/23  Yes Claiborne Rigg, NP  nystatin (NYSTATIN) powder Apply 1 application topically 3 (three) times daily. 03/10/21  Yes Aslam, Leanna Sato, MD  nystatin cream (MYCOSTATIN) Apply 1 Application topically 2 (two) times daily. 01/03/23  Yes Claiborne Rigg, NP  PREMARIN vaginal cream PLACE 1 APPLICATORFUL VAGINALLY DAILY. Patient taking differently: 1 applicator 2 (two) times a week. 11/23/21  Yes Tyson Alias, MD  rosuvastatin (CRESTOR) 20 MG tablet Take 1  tablet (20 mg total) by mouth daily. 09/05/23  Yes Hoy Register, MD  Tiotropium Bromide Monohydrate (SPIRIVA RESPIMAT) 2.5 MCG/ACT AERS Inhale 2 puffs into the lungs daily. 12/30/22  Yes Allwardt, Alyssa M, PA-C  traMADol (ULTRAM) 50 MG tablet Take 50 mg by mouth every 6 (six) hours as needed for moderate pain (pain score 4-6).   Yes [provider]  triamcinolone (KENALOG) 0.025 % ointment APPLY TO AFFECTED AREA TWICE A DAY 05/22/23  Yes Newlin, Enobong, MD  zolpidem (AMBIEN) 5 MG tablet TAKE 1 TABLET BY MOUTH EVERYDAY AT BEDTIME 08/07/23  Yes Claiborne Rigg, NP  ondansetron (ZOFRAN-ODT) 4 MG disintegrating tablet Take 1 tablet (4 mg total) by mouth every 8 (eight) hours as needed for nausea or vomiting. 09/27/23   Anders Simmonds, PA-C  Semaglutide,0.25 or 0.5MG /DOS, 2 MG/3ML SOPN Inject 0.25 mg into the skin  once a week. Patient not taking: Reported on 09/27/2023 09/19/23   Anders Simmonds, PA-C    ROS: Neg HEENT Neg resp Neg cardiac Neg GU Neg MS Neg psych Neg neuro  Objective:   Vitals:   09/27/23 1049  BP: 137/81  Pulse: 93  SpO2: 97%  Weight: 193 lb 3.2 oz (87.6 kg)   Exam General appearance : Awake, alert, not in any distress. Speech Clear. Not toxic looking HEENT: Atraumatic and Normocephalic Neck: Supple, no JVD. No cervical lymphadenopathy.  Chest: Good air entry bilaterally, CTAB.  No rales/rhonchi/wheezing CVS: S1 S2 regular, no murmurs.  Extremities: B/L Lower Ext shows no edema, both legs are warm to touch Neurology: Awake alert, and oriented X 3, CN II-XII intact, Non focal Skin: No Rash  Data Review Lab Results  Component Value Date   HGBA1C 7.3 (A) 09/19/2023   HGBA1C 6.9 05/02/2023   HGBA1C 6.5 (H) 10/04/2022    Assessment & Plan   1. Nausea Possible side effect to flu shot 1 week ago - ondansetron (ZOFRAN-ODT) 4 MG disintegrating tablet; Take 1 tablet (4 mg total) by mouth every 8 (eight) hours as needed for nausea or vomiting.   Dispense: 30 tablet; Refill: 1  2. Other fatigue ? Possible side effect to flu shot 1 week ago.   - B12 and Folate Panel -1 year ago was normal  3. History of vitamin D deficiency - Vitamin D, 25-hydroxy -was normal when rechecked about 11 months ago.  She is taking OTC  4. Other iron deficiency anemia - CBC with Differential - Iron, TIBC and Ferritin Panel    Return in about 4 months (around 01/25/2024) for PCP for chronic conditions-[name].  The patient was given clear instructions to go to ER or return to medical center if symptoms don't improve, worsen or new problems develop. The patient verbalized understanding. The patient was told to call to get lab results if they haven't heard anything in the next week.      Georgian Co, PA-C [facility] [place], [place] [phone]   09/27/2023, 11:06 AM

## 2023-09-28 ENCOUNTER — Telehealth: Payer: Self-pay

## 2023-09-28 ENCOUNTER — Other Ambulatory Visit: Payer: Self-pay

## 2023-09-28 LAB — CBC WITH DIFFERENTIAL/PLATELET
Basophils Absolute: 0.1 10*3/uL (ref 0.0–0.2)
Basos: 1 %
EOS (ABSOLUTE): 0.4 10*3/uL (ref 0.0–0.4)
Eos: 5 %
Hematocrit: 32.3 % — ABNORMAL LOW (ref 34.0–46.6)
Hemoglobin: 10.1 g/dL — ABNORMAL LOW (ref 11.1–15.9)
Immature Grans (Abs): 0 10*3/uL (ref 0.0–0.1)
Immature Granulocytes: 0 %
Lymphocytes Absolute: 2.1 10*3/uL (ref 0.7–3.1)
Lymphs: 25 %
MCH: 26.4 pg — ABNORMAL LOW (ref 26.6–33.0)
MCHC: 31.3 g/dL — ABNORMAL LOW (ref 31.5–35.7)
MCV: 85 fL (ref 79–97)
Monocytes Absolute: 0.6 10*3/uL (ref 0.1–0.9)
Monocytes: 7 %
Neutrophils Absolute: 5.4 10*3/uL (ref 1.4–7.0)
Neutrophils: 62 %
Platelets: 250 10*3/uL (ref 150–450)
RBC: 3.82 x10E6/uL (ref 3.77–5.28)
RDW: 14.3 % (ref 11.7–15.4)
WBC: 8.6 10*3/uL (ref 3.4–10.8)

## 2023-09-28 LAB — IRON,TIBC AND FERRITIN PANEL
Ferritin: 13 ng/mL — ABNORMAL LOW (ref 15–150)
Iron Saturation: 7 % — CL (ref 15–55)
Iron: 28 ug/dL (ref 27–139)
Total Iron Binding Capacity: 382 ug/dL (ref 250–450)
UIBC: 354 ug/dL (ref 118–369)

## 2023-09-28 LAB — VITAMIN D 25 HYDROXY (VIT D DEFICIENCY, FRACTURES): Vit D, 25-Hydroxy: 51.2 ng/mL (ref 30.0–100.0)

## 2023-09-28 LAB — B12 AND FOLATE PANEL
Folate: 16.6 ng/mL (ref >3.0)
Vitamin B-12: 726 pg/mL (ref 232–1245)

## 2023-09-28 MED ORDER — ROPINIROLE HCL 2 MG PO TABS: 2.0000 mg | ORAL_TABLET | Freq: Every day | ORAL | 0 refills | Status: DC

## 2023-09-28 NOTE — Telephone Encounter (Signed)
Pt was called and is aware of results, DOB was confirmed.  ?

## 2023-09-28 NOTE — Telephone Encounter (Signed)
-----   Message from Georgian Co sent at 09/28/2023  7:15 AM EST ----- Please call patient. She is still anemic but hemoglobin is stable. I recommend she take iron tablets once daily to improve iron stores and hemoglobin. Vitamin D level is normal. She should continue the over the counter vitamin D. Vitamin B12 and folate are normal. Thanks, Georgian Co, PA-C

## 2023-09-28 NOTE — Telephone Encounter (Signed)
Thank you :)

## 2023-09-28 NOTE — Telephone Encounter (Signed)
Patient called and left a message - she never got the requip prescription. Looks like a clinic administration version was entered by mistake - I sent Requip 2 mg 1 at bedtime # 30 to her pharmacy. thanks

## 2023-10-02 ENCOUNTER — Other Ambulatory Visit: Payer: Self-pay | Admitting: Physician Assistant

## 2023-10-02 MED ORDER — IRON (FERROUS SULFATE) 325 (65 FE) MG PO TABS: 325.0000 mg | ORAL_TABLET | Freq: Every day | ORAL | 5 refills | Status: AC

## 2023-10-02 MED ORDER — ALBUTEROL SULFATE HFA 108 (90 BASE) MCG/ACT IN AERS: 2.0000 | INHALATION_SPRAY | RESPIRATORY_TRACT | 3 refills | Status: DC | PRN

## 2023-10-02 NOTE — Telephone Encounter (Signed)
Called patient and she is aware of medication refills

## 2023-10-08 ENCOUNTER — Other Ambulatory Visit: Payer: Self-pay | Admitting: Nurse Practitioner

## 2023-10-08 DIAGNOSIS — J40 Bronchitis, not specified as acute or chronic: Secondary | ICD-10-CM

## 2023-10-09 NOTE — Telephone Encounter (Signed)
Requested Prescriptions  Pending Prescriptions Disp Refills   fluticasone-salmeterol (WIXELA INHUB) 250-50 MCG/ACT AEPB [Pharmacy Med Name: WIXELA 250-50 INHUB] 60 each 0    Sig: INHALE 1 PUFF INTO THE LUNGS IN THE MORNING AND AT BEDTIME.     Pulmonology:  Combination Products Passed - 10/08/2023  8:06 AM      Passed - Valid encounter within last 12 months    Recent Outpatient Visits           1 week ago Other fatigue   [facility] [place]. [facility] [place], Marylene Land M, [place]   2 weeks ago Type 2 diabetes mellitus with hyperglycemia, unspecified whether long term insulin use (HCC)   [facility] [place]. [facility], [place], [place]   3 months ago Uncontrolled type 2 diabetes mellitus with hyperglycemia [facility])   [facility] [place]. [facility], Marylene Land M, [place]   5 months ago Uncontrolled type 2 diabetes mellitus with hyperglycemia [facility])   [facility] [place]. [facility] Claiborne Rigg, NP   8 months ago Chronic venous insufficiency    [facility] . [facility] Claiborne Rigg, [place]

## 2023-10-10 ENCOUNTER — Other Ambulatory Visit: Payer: Self-pay | Admitting: Nurse Practitioner

## 2023-10-10 DIAGNOSIS — F418 Other specified anxiety disorders: Secondary | ICD-10-CM

## 2023-10-14 ENCOUNTER — Other Ambulatory Visit: Payer: Self-pay | Admitting: Family Medicine

## 2023-10-14 DIAGNOSIS — G894 Chronic pain syndrome: Secondary | ICD-10-CM

## 2023-10-14 DIAGNOSIS — G2581 Restless legs syndrome: Secondary | ICD-10-CM

## 2023-10-14 DIAGNOSIS — M47816 Spondylosis without myelopathy or radiculopathy, lumbar region: Secondary | ICD-10-CM

## 2023-10-14 DIAGNOSIS — M51369 Other intervertebral disc degeneration, lumbar region without mention of lumbar back pain or lower extremity pain: Secondary | ICD-10-CM

## 2023-10-15 DIAGNOSIS — J449 Chronic obstructive pulmonary disease, unspecified: Secondary | ICD-10-CM | POA: Diagnosis not present

## 2023-10-17 DIAGNOSIS — M51369 Other intervertebral disc degeneration, lumbar region without mention of lumbar back pain or lower extremity pain: Secondary | ICD-10-CM | POA: Diagnosis not present

## 2023-10-17 DIAGNOSIS — Z6836 Body mass index (BMI) 36.0-36.9, adult: Secondary | ICD-10-CM | POA: Diagnosis not present

## 2023-10-17 DIAGNOSIS — G63 Polyneuropathy in diseases classified elsewhere: Secondary | ICD-10-CM | POA: Diagnosis not present

## 2023-10-17 DIAGNOSIS — E6609 Other obesity due to excess calories: Secondary | ICD-10-CM | POA: Diagnosis not present

## 2023-10-17 DIAGNOSIS — R030 Elevated blood-pressure reading, without diagnosis of hypertension: Secondary | ICD-10-CM | POA: Diagnosis not present

## 2023-10-17 DIAGNOSIS — Z6835 Body mass index (BMI) 35.0-35.9, adult: Secondary | ICD-10-CM | POA: Diagnosis not present

## 2023-10-19 ENCOUNTER — Other Ambulatory Visit: Payer: Self-pay | Admitting: Nurse Practitioner

## 2023-10-19 DIAGNOSIS — M51369 Other intervertebral disc degeneration, lumbar region without mention of lumbar back pain or lower extremity pain: Secondary | ICD-10-CM

## 2023-10-20 ENCOUNTER — Other Ambulatory Visit: Payer: Self-pay | Admitting: Podiatry

## 2023-10-23 ENCOUNTER — Telehealth: Payer: Medicare HMO | Admitting: Nurse Practitioner

## 2023-10-23 ENCOUNTER — Ambulatory Visit: Payer: Self-pay | Admitting: *Deleted

## 2023-10-23 DIAGNOSIS — H66001 Acute suppurative otitis media without spontaneous rupture of ear drum, right ear: Secondary | ICD-10-CM | POA: Diagnosis not present

## 2023-10-23 MED ORDER — AZITHROMYCIN 250 MG PO TABS
ORAL_TABLET | ORAL | 0 refills | Status: AC
Start: 2023-10-23 — End: 2023-10-28

## 2023-10-23 MED ORDER — NEOMYCIN-POLYMYXIN-HC 3.5-10000-1 OT SOLN
3.0000 [drp] | Freq: Three times a day (TID) | OTIC | 0 refills | Status: AC
Start: 2023-10-23 — End: 2023-10-28

## 2023-10-23 NOTE — Telephone Encounter (Signed)
  Chief Complaint: ear pain Symptoms: left ear pain Frequency: started early am Pertinent Negatives: Patient denies fever, sinus symptoms Disposition: [] ED /[] Urgent Care (no appt availability in office) / [] Appointment(In office/virtual)/ [x]  [facility]/ [] Home Care/ [] Refused Recommended Disposition /[] [facility]/ []  Follow-up with PCP Additional Notes: No open appointment- VV/UC scheduled.

## 2023-10-23 NOTE — Progress Notes (Signed)
Virtual Visit Consent   Mary Gray, you are scheduled for a virtual visit with a [facility] provider today. Just as with appointments in the office, your consent must be obtained to participate. Your consent will be active for this visit and any virtual visit you may have with one of our providers in the next 365 days. If you have a MyChart account, a copy of this consent can be sent to you electronically.  As this is a virtual visit, video technology does not allow for your provider to perform a traditional examination. This may limit your provider's ability to fully assess your condition. If your provider identifies any concerns that need to be evaluated in person or the need to arrange testing (such as labs, EKG, etc.), we will make arrangements to do so. Although advances in technology are sophisticated, we cannot ensure that it will always work on either your end or our end. If the connection with a video visit is poor, the visit may have to be switched to a telephone visit. With either a video or telephone visit, we are not always able to ensure that we have a secure connection.  By engaging in this virtual visit, you consent to the provision of healthcare and authorize for your insurance to be billed (if applicable) for the services provided during this visit. Depending on your insurance coverage, you may receive a charge related to this service.  I need to obtain your verbal consent now. Are you willing to proceed with your visit today? Mary Gray has provided verbal consent on 10/23/2023 for a virtual visit (video or telephone). Viviano Simas, FNP  Date: 10/23/2023 9:45 AM  Virtual Visit via Video Note   I, Viviano Simas, connected with  Mary Gray  ([id number], [birth year]) on 10/23/23 at 10:45 AM EST by a video-enabled telemedicine application and verified that I am speaking with the correct person using two identifiers.  Location: Patient: Virtual Visit Location Patient:  Home Provider: Virtual Visit Location Provider: Home Office   I discussed the limitations of evaluation and management by telemedicine and the availability of in person appointments. The patient expressed understanding and agreed to proceed.    History of Present Illness: Mary Gray is a 62 y.o. who identifies as a female who was assigned female at birth, and is being seen today for an earache  Symptoms started at 2am today  Her left ear woke her up in pain   She took tylenol without relief   She has a history of ear infections, she has been recommended for surgery and tube placement but has not had that done  She does suffer from chronic "fluid in her ears" She does have nasal congestion  Denies fever   She has not had any surgeries on her ears in the past  Denies history of ruptured ear drums   Problems:  Patient Active Problem List   Diagnosis Date Noted   Pain due to dental caries 06/15/2023   Dental infection 06/15/2023   Acute respiratory failure with hypoxia (HCC)    COVID-19 virus infection 01/20/2022   Lumbar spondylosis 03/22/2021   Chronic radicular lumbar pain 03/22/2021   Bilateral primary osteoarthritis of knee 03/22/2021   Chronic pain syndrome 03/22/2021   Tobacco use disorder 03/22/2021   Hyponatremia 03/04/2021   Chronic bilateral low back pain without sciatica 12/13/2020   Sleep disturbance 12/13/2020   Vaginitis 10/27/2020   Right ear pain 10/13/2020   Nausea 02/10/2020  B12 deficiency 11/25/2018   Urinary, incontinence, stress female 09/09/2018   Primary osteoarthritis of right knee 05/01/2018   Chronic venous insufficiency 02/26/2017   Candidal skin infection 03/03/2014   Preventative health care 05/14/2013   Spinal stenosis, lumbar region, with neurogenic claudication 03/01/2011   Hypothyroidism 07/06/2006   Diabetes mellitus type 2, uncontrolled 07/06/2006   Hyperlipemia 07/06/2006   Generalized anxiety disorder 07/06/2006   Essential  hypertension 07/06/2006   GERD 07/06/2006   Lumbar degenerative disc disease 07/06/2006    Allergies:  Allergies  Allergen Reactions   Amoxicillin Other (See Comments)   Codeine Sulfate Other (See Comments)    Confusion, anxiety, fatigue   Meperidine And Related Nausea And Vomiting   Minocycline Hcl Nausea And Vomiting    Hospitalized for vomiting along with allergic reaction   Penicillins Other (See Comments)    Has patient had a PCN reaction causing immediate rash, facial/tongue/throat swelling, SOB or lightheadedness with hypotension: Yes Has patient had a PCN reaction causing severe rash involving mucus membranes or skin necrosis: Yes Has patient had a PCN reaction that required hospitalization Yes Has patient had a PCN reaction occurring within the last 10 years: Yes If all of the above answers are "NO", then may proceed with Cephalosporin use.    Propoxyphene N-Acetaminophen Other (See Comments)    confused   Tetracycline Hcl Other (See Comments)    Vomiting Note: Patient took a course of doxycycline in June 2014 without any problems.   Medications:  Current Outpatient Medications:    Accu-Chek Softclix Lancets lancets, Check blood sugar 1 time per day, Disp: 100 each, Rfl: 3   acetaminophen (TYLENOL) 500 MG tablet, Take 1,000 mg by mouth every 6 (six) hours as needed for mild pain., Disp: , Rfl:    albuterol (VENTOLIN HFA) 108 (90 Base) MCG/ACT inhaler, Inhale 2 puffs into the lungs every 4 (four) hours as needed for wheezing or shortness of breath., Disp: 17 each, Rfl: 3   amitriptyline (ELAVIL) 25 MG tablet, TAKE 1 TABLET BY MOUTH EVERYDAY AT BEDTIME, Disp: 90 tablet, Rfl: 1   amLODipine (NORVASC) 2.5 MG tablet, Take 1 tablet (2.5 mg total) by mouth daily., Disp: 90 tablet, Rfl: 3   aspirin EC 81 MG tablet, Take 81 mg by mouth daily. Swallow whole., Disp: , Rfl:    [facility]S VITAMIN B12 1000 MCG tablet, TAKE 1 TABLET BY MOUTH EVERY DAY (Patient taking differently: Take 1,000 mcg  by mouth daily.), Disp: 90 tablet, Rfl: 3   cyclobenzaprine (FLEXERIL) 5 MG tablet, TAKE 1 TABLET BY MOUTH EVERYDAY AT BEDTIME, Disp: 30 tablet, Rfl: 0   enalapril (VASOTEC) 20 MG tablet, Take 1 tablet (20 mg total) by mouth daily., Disp: 90 tablet, Rfl: 0   esomeprazole (NEXIUM) 40 MG capsule, TAKE 1 CAPSULE (40 MG TOTAL) BY MOUTH DAILY., Disp: 90 capsule, Rfl: 1   fluticasone-salmeterol (WIXELA INHUB) 250-50 MCG/ACT AEPB, INHALE 1 PUFF INTO THE LUNGS IN THE MORNING AND AT BEDTIME., Disp: 60 each, Rfl: 0   gabapentin (NEURONTIN) 600 MG tablet, TAKE 1 TABLET BY MOUTH THREE TIMES A DAY, Disp: 270 tablet, Rfl: 0   glucose blood (ACCU-CHEK GUIDE) test strip, CHECK BLOOD SUGAR 1 TIME PER DAY, Disp: 100 strip, Rfl: 3   ipratropium-albuterol (DUONEB) 0.5-2.5 (3) MG/3ML SOLN, Take 3 mLs by nebulization every 4 (four) hours as needed., Disp: 60 each, Rfl: 0   Iron, Ferrous Sulfate, 325 (65 Fe) MG TABS, Take 325 mg by mouth daily., Disp: 90 tablet, Rfl: 5  levocetirizine (XYZAL) 5 MG tablet, Take 1 tablet (5 mg total) by mouth every evening., Disp: 30 tablet, Rfl: 5   levothyroxine (SYNTHROID) 88 MCG tablet, Take 1 tablet (88 mcg total) by mouth daily before breakfast., Disp: 90 tablet, Rfl: 2   meloxicam (MOBIC) 7.5 MG tablet, TAKE 1 TABLET BY MOUTH EVERY DAY, Disp: 90 tablet, Rfl: 0   metFORMIN (GLUCOPHAGE-XR) 500 MG 24 hr tablet, Take 2 tablets (1,000 mg total) by mouth 2 (two) times daily., Disp: 360 tablet, Rfl: 0   Misc. Devices MISC, Provide patient with shower chair and grab bar. ICD10 M17.10 M54.16, Disp: 1 each, Rfl: 0   multivitamin-iron-minerals-folic acid (CENTRUM) chewable tablet, Chew 1 tablet by mouth daily., Disp: 90 tablet, Rfl: 3   naloxegol oxalate (MOVANTIK) 25 MG TABS tablet, TAKE 1 TABLET (25 MG TOTAL) BY MOUTH DAILY., Disp: 90 tablet, Rfl: 0   nystatin (NYSTATIN) powder, Apply 1 application topically 3 (three) times daily., Disp: 15 g, Rfl: 0   nystatin cream (MYCOSTATIN), Apply 1  Application topically 2 (two) times daily., Disp: 60 g, Rfl: 3   ondansetron (ZOFRAN-ODT) 4 MG disintegrating tablet, Take 1 tablet (4 mg total) by mouth every 8 (eight) hours as needed for nausea or vomiting., Disp: 30 tablet, Rfl: 1   PREMARIN vaginal cream, PLACE 1 APPLICATORFUL VAGINALLY DAILY. (Patient taking differently: 1 applicator 2 (two) times a week.), Disp: 30 g, Rfl: 1   rOPINIRole (REQUIP) 2 MG tablet, TAKE 1 TABLET BY MOUTH AT BEDTIME., Disp: 90 tablet, Rfl: 1   rosuvastatin (CRESTOR) 20 MG tablet, Take 1 tablet (20 mg total) by mouth daily., Disp: 90 tablet, Rfl: 0   Semaglutide,0.25 or 0.5MG /DOS, 2 MG/3ML SOPN, Inject 0.25 mg into the skin once a week. (Patient not taking: Reported on 09/27/2023), Disp: 3 mL, Rfl: 0   Tiotropium Bromide Monohydrate (SPIRIVA RESPIMAT) 2.5 MCG/ACT AERS, Inhale 2 puffs into the lungs daily., Disp: 4 g, Rfl: 0   traMADol (ULTRAM) 50 MG tablet, Take 50 mg by mouth every 6 (six) hours as needed for moderate pain (pain score 4-6)., Disp: , Rfl:    triamcinolone (KENALOG) 0.025 % ointment, APPLY TO AFFECTED AREA TWICE A DAY, Disp: 30 g, Rfl: 0   zolpidem (AMBIEN) 5 MG tablet, TAKE 1 TABLET BY MOUTH EVERYDAY AT BEDTIME, Disp: 30 tablet, Rfl: 1  Current Facility-Administered Medications:    rOPINIRole (REQUIP) tablet 2 mg, 2 mg, Oral, QHS,   Observations/Objective: Patient is well-developed, well-nourished in no acute distress.  Resting comfortably  at home.  Head is normocephalic, atraumatic.  No labored breathing.  Speech is clear and coherent with logical content.  Patient is alert and oriented at baseline.    Assessment and Plan:  1. Non-recurrent acute suppurative otitis media of right ear without spontaneous rupture of tympanic membrane  - azithromycin (ZITHROMAX) 250 MG tablet; Take 2 tablets on day 1, then 1 tablet daily on days 2 through 5  Dispense: 6 tablet; Refill: 0     Meds ordered this encounter  Medications   azithromycin  (ZITHROMAX) 250 MG tablet    Sig: Take 2 tablets on day 1, then 1 tablet daily on days 2 through 5    Dispense:  6 tablet    Refill:  0   neomycin-polymyxin-hydrocortisone (CORTISPORIN) OTIC solution    Sig: Place 3 drops into the right ear 3 (three) times daily for 5 days.    Dispense:  10 mL    Refill:  0    Patient  requested ear drops as well as this has provided additional relief in the past   Follow Up Instructions: I discussed the assessment and treatment plan with the patient. The patient was provided an opportunity to ask questions and all were answered. The patient agreed with the plan and demonstrated an understanding of the instructions.  A copy of instructions were sent to the patient via MyChart unless otherwise noted below.     The patient was advised to call back or seek an in-person evaluation if the symptoms worsen or if the condition fails to improve as anticipated.    Viviano Simas, FNP

## 2023-10-23 NOTE — Telephone Encounter (Signed)
Noted  

## 2023-10-23 NOTE — Telephone Encounter (Signed)
Reason for Disposition  [1] SEVERE pain AND [2] not improved 2 hours after taking analgesic medication (e.g., ibuprofen or acetaminophen)  Answer Assessment - Initial Assessment Questions 1. LOCATION: "Which ear is involved?"     Left ear 2. ONSET: "When did the ear start hurting"      Started early am 2am 3. SEVERITY: "How bad is the pain?"  (Scale 1-10; mild, moderate or severe)   - MILD (1-3): doesn't interfere with normal activities    - MODERATE (4-7): interferes with normal activities or awakens from sleep    - SEVERE (8-10): excruciating pain, unable to do any normal activities      9/10 4. URI SYMPTOMS: "Do you have a runny nose or cough?"     no 5. FEVER: "Do you have a fever?" If Yes, ask: "What is your temperature, how was it measured, and when did it start?"     no 6. CAUSE: "Have you been swimming recently?", "How often do you use Q-TIPS?", "Have you had any recent air travel or scuba diving?"     no 7. OTHER SYMPTOMS: "Do you have any other symptoms?" (e.g., headache, stiff neck, dizziness, vomiting, runny nose, decreased hearing)     no  Protocols used: Earache-A-AH

## 2023-11-01 ENCOUNTER — Other Ambulatory Visit: Payer: Self-pay | Admitting: Nurse Practitioner

## 2023-11-01 DIAGNOSIS — E1165 Type 2 diabetes mellitus with hyperglycemia: Secondary | ICD-10-CM

## 2023-11-01 MED ORDER — ACCU-CHEK SOFTCLIX LANCETS MISC
3 refills | Status: AC
Start: 2023-11-01 — End: ?

## 2023-11-01 NOTE — Telephone Encounter (Signed)
Medication Refill -  Most Recent Primary Care Visit:  Provider: Anders Simmonds  Department: [facility]  Visit Type: OFFICE VISIT  Date: 09/27/2023  Medication: Accu-Chek Softclix Lancets lancets /glucose blood (ACCU-CHEK GUIDE) test strip   Has the patient contacted their pharmacy? yes (Agent: If yes, when and what did the pharmacy advise?)contact pcp  Is this the correct pharmacy for this prescription? yes This is the patient's preferred pharmacy:  [id number] [place], [place] - [address] [place] [address] Phone: [phone] Fax: [phone]   Has the prescription been filled recently? no  Is the patient out of the medication? yes  Has the patient been seen for an appointment in the last year OR does the patient have an upcoming appointment? yes  Can we respond through MyChart? yes  Agent: Please be advised that Rx refills may take up to 3 business days. We ask that you follow-up with your pharmacy.

## 2023-11-01 NOTE — Telephone Encounter (Signed)
Requested medication (s) are due for refill today: review  Requested medication (s) are on the active medication list: yes  Last refill:  07/12/20  Future visit scheduled: no  Notes to clinic:  last prescribe Erlinda Hong, also test strips NT unable to request for refill, received error message. Please advise. The medications or procedures for these orders are no longer available for ordering. Place new orders instead.      Requested Prescriptions  Pending Prescriptions Disp Refills   Accu-Chek Softclix Lancets lancets 100 each 3    Sig: Check blood sugar 1 time per day     Endocrinology: Diabetes - Testing Supplies Passed - 11/01/2023  5:04 PM      Passed - Valid encounter within last 12 months    Recent Outpatient Visits           1 month ago Other fatigue   [facility] - [facility] Dept Of [place]. [facility] [place], [place], [place]   1 month ago Type 2 diabetes mellitus with hyperglycemia, unspecified whether long term insulin use (HCC)   [facility] [facility] - A Dept Of [place]. [facility], [place], [place]   3 months ago Uncontrolled type 2 diabetes mellitus with hyperglycemia [facility])   [facility] [facility] - A Dept Of [place]. [facility], Marylene Land M, [place]   6 months ago Uncontrolled type 2 diabetes mellitus with hyperglycemia [facility])   [place] [facility] - A Dept Of [place]. [facility] Claiborne Rigg, NP   9 months ago Chronic venous insufficiency   [place] [facility] [place] - A Dept Of [place]. [facility] Claiborne Rigg, [place]

## 2023-11-02 ENCOUNTER — Telehealth: Payer: Self-pay | Admitting: Nurse Practitioner

## 2023-11-02 NOTE — Telephone Encounter (Signed)
Patient does not have either requested medication/supply in recent history and PCP will have to review request.   Supply requested: new glucose meter: Accu chek guide  Medication: Insulin- Lantus Solostar- not listed in history- last insulin lantus- 01/20/22,05/01/13- outside providers

## 2023-11-02 NOTE — Telephone Encounter (Signed)
Medication Refill -  Most Recent Primary Care Visit:  Provider: Anders Simmonds  Department: [facility]  Visit Type: OFFICE VISIT  Date: 09/27/2023  Medication: (ACCU-CHEK GUIDE Meter Lantus solostar U100    Has the patient contacted their pharmacy? Yes (Agent: If no, request that the patient contact the pharmacy for the refill. If patient does not wish to contact the pharmacy document the reason why and proceed with request.) (Agent: If yes, when and what did the pharmacy advise?)  Is this the correct pharmacy for this prescription? Yes If no, delete pharmacy and type the correct one.  This is the patient's preferred pharmacy:  [facility] [place] [address] [phone]    Has the prescription been filled recently? Yes  Is the patient out of the medication? Yes  Has the patient been seen for an appointment in the last year OR does the patient have an upcoming appointment? Yes  Can we respond through MyChart? No  Agent: Please be advised that Rx refills may take up to 3 business days. We ask that you follow-up with your pharmacy.

## 2023-11-04 ENCOUNTER — Other Ambulatory Visit: Payer: Self-pay | Admitting: Nurse Practitioner

## 2023-11-04 DIAGNOSIS — J40 Bronchitis, not specified as acute or chronic: Secondary | ICD-10-CM

## 2023-11-05 ENCOUNTER — Other Ambulatory Visit: Payer: Self-pay | Admitting: Pharmacist

## 2023-11-05 MED ORDER — ACCU-CHEK GUIDE TEST VI STRP: ORAL_STRIP | 6 refills | Status: DC

## 2023-11-05 MED ORDER — ACCU-CHEK GUIDE W/DEVICE KIT: PACK | 0 refills | Status: AC

## 2023-11-05 NOTE — Telephone Encounter (Signed)
Thanks [employer]. No she is not on insulin.

## 2023-11-05 NOTE — Telephone Encounter (Signed)
Requested Prescriptions  Pending Prescriptions Disp Refills   fluticasone-salmeterol (WIXELA INHUB) 250-50 MCG/ACT AEPB [Pharmacy Med Name: WIXELA 250-50 INHUB] 60 each 5    Sig: INHALE 1 PUFF INTO THE LUNGS IN THE MORNING AND AT BEDTIME.     Pulmonology:  Combination Products Passed - 11/05/2023  3:00 PM      Passed - Valid encounter within last 12 months    Recent Outpatient Visits           1 month ago Other fatigue   [facility] [place] [facility] [place] - [facility] Dept Of [place]. [facility] [place], [place], [place]   1 month ago Type 2 diabetes mellitus with hyperglycemia, unspecified whether long term insulin use (HCC)   [place] [facility] - A Dept Of [place]. [facility], [place], [place]   3 months ago Uncontrolled type 2 diabetes mellitus with hyperglycemia [facility])   [place] [facility] - A Dept Of [place]. [facility], Marylene Land M, [place]   6 months ago Uncontrolled type 2 diabetes mellitus with hyperglycemia [facility])   [place] [facility] - A Dept Of [place]. [facility] Claiborne Rigg, NP   9 months ago Chronic venous insufficiency   [place] [facility] [place] - A Dept Of . [facility] Claiborne Rigg, [place]

## 2023-11-09 ENCOUNTER — Other Ambulatory Visit: Payer: Self-pay | Admitting: Nurse Practitioner

## 2023-11-13 ENCOUNTER — Other Ambulatory Visit: Payer: Self-pay | Admitting: Family Medicine

## 2023-11-13 DIAGNOSIS — G894 Chronic pain syndrome: Secondary | ICD-10-CM

## 2023-11-13 DIAGNOSIS — M51369 Other intervertebral disc degeneration, lumbar region without mention of lumbar back pain or lower extremity pain: Secondary | ICD-10-CM

## 2023-11-13 DIAGNOSIS — M47816 Spondylosis without myelopathy or radiculopathy, lumbar region: Secondary | ICD-10-CM

## 2023-11-13 DIAGNOSIS — G2581 Restless legs syndrome: Secondary | ICD-10-CM

## 2023-11-15 NOTE — Telephone Encounter (Signed)
Requested medication (s) are due for refill today: Yes  Requested medication (s) are on the active medication list: yes    Last refill: 10/15/23   #30  0 refills  Future visit scheduled no  Notes to clinic:Not delegated, please review. Thank you.  Requested Prescriptions  Pending Prescriptions Disp Refills   cyclobenzaprine (FLEXERIL) 5 MG tablet [Pharmacy Med Name: CYCLOBENZAPRINE 5 MG TABLET] 30 tablet 0    Sig: TAKE 1 TABLET BY MOUTH EVERYDAY AT BEDTIME     Not Delegated - Analgesics:  Muscle Relaxants Failed - 11/15/2023  8:09 AM      Failed - This refill cannot be delegated      Passed - Valid encounter within last 6 months    Recent Outpatient Visits           1 month ago Other fatigue   [place] [facility] [place] - A Dept Of [place]. [facility] [place], [place], [place]   1 month ago Type 2 diabetes mellitus with hyperglycemia, unspecified whether long term insulin use (HCC)   [place] [facility] - A Dept Of [place]. [facility], Marylene Land M, [place]   4 months ago Uncontrolled type 2 diabetes mellitus with hyperglycemia [facility])   [place] [facility] - A Dept Of [place]. [facility], Marylene Land M, [place]   6 months ago Uncontrolled type 2 diabetes mellitus with hyperglycemia [facility])   [place] [facility] - A Dept Of [place]. [facility] Claiborne Rigg, NP   9 months ago Chronic venous insufficiency    [facility] [place] - A Dept Of [place]. [facility] Claiborne Rigg, [place]

## 2023-11-16 DIAGNOSIS — G63 Polyneuropathy in diseases classified elsewhere: Secondary | ICD-10-CM | POA: Diagnosis not present

## 2023-11-16 DIAGNOSIS — M51369 Other intervertebral disc degeneration, lumbar region without mention of lumbar back pain or lower extremity pain: Secondary | ICD-10-CM | POA: Diagnosis not present

## 2023-11-16 DIAGNOSIS — Z6835 Body mass index (BMI) 35.0-35.9, adult: Secondary | ICD-10-CM | POA: Diagnosis not present

## 2023-11-16 DIAGNOSIS — E6609 Other obesity due to excess calories: Secondary | ICD-10-CM | POA: Diagnosis not present

## 2023-11-16 DIAGNOSIS — Z6836 Body mass index (BMI) 36.0-36.9, adult: Secondary | ICD-10-CM | POA: Diagnosis not present

## 2023-11-16 DIAGNOSIS — R030 Elevated blood-pressure reading, without diagnosis of hypertension: Secondary | ICD-10-CM | POA: Diagnosis not present

## 2023-11-19 ENCOUNTER — Ambulatory Visit (HOSPITAL_COMMUNITY): Payer: Medicare HMO

## 2023-11-19 ENCOUNTER — Ambulatory Visit: Payer: Self-pay | Admitting: *Deleted

## 2023-11-19 ENCOUNTER — Encounter: Payer: Self-pay | Admitting: *Deleted

## 2023-11-19 NOTE — Telephone Encounter (Signed)
  Chief Complaint: Headache Symptoms: 10/10 headache, tylenol helps for 3-4 hours. Sinus pain, nausea, dizziness Frequency: 4 days Pertinent Negatives: Patient denies vomiting, SOB Disposition: [] ED /[x] Urgent Care (no appt availability in office) / [] Appointment(In office/virtual)/ []  [facility]/ [] Home Care/ [] Refused Recommended Disposition /[] [facility] [employer]/ []  Follow-up with PCP Additional Notes:  No availability at practice, advised UC. Care advise provided, verbalizes understanding. Reason for Disposition  [1] MODERATE headache (e.g., interferes with normal activities) AND [2] present > 24 hours AND [3] unexplained  (Exceptions: analgesics not tried, typical migraine, or headache part of viral illness)  Answer Assessment - Initial Assessment Questions 1. LOCATION: "Where does it hurt?"      [place] 2. ONSET: "When did the headache start?" (Minutes, hours or days)      4 days 3. PATTERN: "Does the pain come and go, or has it been constant since it started?"     Constant, ES tylenol helps for 3-4 hours 4. SEVERITY: "How bad is the pain?" and "What does it keep you from doing?"  (e.g., Scale 1-10; mild, moderate, or severe)   - MILD (1-3): doesn't interfere with normal activities    - MODERATE (4-7): interferes with normal activities or awakens from sleep    - SEVERE (8-10): excruciating pain, unable to do any normal activities        10/10 when med wears off  6. CAUSE: "What do you think is causing the headache?"     Maybe sinus  9. OTHER SYMPTOMS: "Do you have any other symptoms?" (fever, stiff neck, eye pain, sore throat, cold symptoms)     Earache, cheekbones painful, eyes,cough, dry, nasal drainage off and On. Dizziness, spinning and floaty,x 4 days.NAusea  Protocols used: Headache-A-AH

## 2023-11-19 NOTE — Telephone Encounter (Signed)
This encounter was created in error - please disregard.

## 2023-11-19 NOTE — Telephone Encounter (Signed)
Noted  

## 2023-11-19 NOTE — Telephone Encounter (Signed)
Patient returned call to request medications zofran and flonase. Medications not on current med list. Patient reports she is still having sx headache and disorientation. Patient reports she did not go to UC due to no transportation. Recommended patient to call 911 if needed to go to ED. Patient reports she will call niece again . Please advise . Patient requesting a call back.

## 2023-11-20 ENCOUNTER — Ambulatory Visit (HOSPITAL_COMMUNITY): Payer: Medicare HMO

## 2023-11-20 NOTE — Telephone Encounter (Signed)
 Noted.

## 2023-11-24 ENCOUNTER — Other Ambulatory Visit: Payer: Self-pay | Admitting: Physician Assistant

## 2023-11-24 DIAGNOSIS — E1165 Type 2 diabetes mellitus with hyperglycemia: Secondary | ICD-10-CM

## 2023-12-06 ENCOUNTER — Ambulatory Visit: Payer: Medicare HMO

## 2023-12-16 ENCOUNTER — Other Ambulatory Visit: Payer: Self-pay | Admitting: Nurse Practitioner

## 2023-12-16 DIAGNOSIS — K5909 Other constipation: Secondary | ICD-10-CM

## 2023-12-18 ENCOUNTER — Other Ambulatory Visit: Payer: Self-pay | Admitting: Nurse Practitioner

## 2023-12-18 DIAGNOSIS — Z6835 Body mass index (BMI) 35.0-35.9, adult: Secondary | ICD-10-CM | POA: Diagnosis not present

## 2023-12-18 DIAGNOSIS — R030 Elevated blood-pressure reading, without diagnosis of hypertension: Secondary | ICD-10-CM | POA: Diagnosis not present

## 2023-12-18 DIAGNOSIS — Z6836 Body mass index (BMI) 36.0-36.9, adult: Secondary | ICD-10-CM | POA: Diagnosis not present

## 2023-12-18 DIAGNOSIS — M51369 Other intervertebral disc degeneration, lumbar region without mention of lumbar back pain or lower extremity pain: Secondary | ICD-10-CM | POA: Diagnosis not present

## 2023-12-18 DIAGNOSIS — G63 Polyneuropathy in diseases classified elsewhere: Secondary | ICD-10-CM | POA: Diagnosis not present

## 2023-12-18 DIAGNOSIS — K5909 Other constipation: Secondary | ICD-10-CM

## 2023-12-18 DIAGNOSIS — E6609 Other obesity due to excess calories: Secondary | ICD-10-CM | POA: Diagnosis not present

## 2023-12-18 DIAGNOSIS — Z79899 Other long term (current) drug therapy: Secondary | ICD-10-CM | POA: Diagnosis not present

## 2023-12-18 NOTE — Telephone Encounter (Signed)
Requested medication (s) are due for refill today: yes   Requested medication (s) are on the active medication list: yes   Last refill:  09/20/23 #90 0 refills   Future visit scheduled: no   Notes to clinic:  no refills remain. Do you want to continue refill?     Requested Prescriptions  Pending Prescriptions Disp Refills   MOVANTIK 25 MG TABS tablet [Pharmacy Med Name: MOVANTIK 25 MG TABLET] 90 tablet 0    Sig: TAKE 1 TABLET (25 MG TOTAL) BY MOUTH DAILY.     Gastroenterology: Opioid-Induced Constipation - naloxegol Passed - 12/18/2023  8:55 AM      Passed - Cr in normal range and within 360 days    Creat  Date Value Ref Range Status  02/09/2015 0.55 0.50 - 1.10 mg/dL Final   Creatinine, Ser  Date Value Ref Range Status  09/19/2023 0.66 0.57 - 1.00 mg/dL Final   Creatinine,U  Date Value Ref Range Status  04/29/2010 71.9 mg/dL Final   Creatinine, Urine  Date Value Ref Range Status  03/22/2015 27.33 >20.0 mg/dL Final    Comment:    * (PPS) Presumptive positive screen result to be verified by         quantitative LC/MS or GC/MS confirmation testing.          Passed - Valid encounter within last 12 months    Recent Outpatient Visits           2 months ago Other fatigue   [place] [facility] [place] [place] - [facility] Dept Of [place]. [facility], Marylene Land M, [place]   3 months ago Type 2 diabetes mellitus with hyperglycemia, unspecified whether long term insulin use (HCC)   [place] [facility] - A Dept Of [place]. [facility], Marylene Land M, [place]   5 months ago Uncontrolled type 2 diabetes mellitus with hyperglycemia [facility])   [place] [facility] - A Dept Of [place]. [facility], Mary[place], [place]   7 months ago Uncontrolled type 2 diabetes mellitus with hyperglycemia [facility])   [place] [facility] - A Dept Of [place]. [facility] Claiborne Rigg, NP   11 months ago Chronic venous insufficiency    [place] [facility] [place] - A Dept Of [place]. [facility] Claiborne Rigg, [place]

## 2023-12-18 NOTE — Telephone Encounter (Signed)
Copied from CRM [id number]. Topic: Clinical - Medication Refill >> Dec 18, 2023  4:19 PM Patsy Lager T wrote: Most Recent Primary Care Visit:  Provider: Anders Simmonds  Department[facility]  Visit Type: OFFICE VISIT  Date: 09/27/2023  Medication: naloxegol oxalate (MOVANTIK) 25 MG TABS tablet  Has the patient contacted their pharmacy? Yes Contact PCP for refills  Is this the correct pharmacy for this prescription? Yes   This is the patient's preferred pharmacy:  [id number] [place], [place] - [address] [place] [address] Phone: [phone] Fax: [phone]  Has the prescription been filled recently? Yes  Is the patient out of the medication? Yes  Has the patient been seen for an appointment in the last year OR does the patient have an upcoming appointment? Yes  Can we respond through MyChart? Yes  Agent: Please be advised that Rx refills may take up to 3 business days. We ask that you follow-up with your pharmacy.

## 2023-12-18 NOTE — Telephone Encounter (Signed)
Requested medication (s) are due for refill today: yes   Requested medication (s) are on the active medication list: yes   Last refill:  09/20/23 #90 0 refills  Future visit scheduled: no   Notes to clinic:  no refills remain. Patient requesting refill. Do you want to refill Rx?     Requested Prescriptions  Pending Prescriptions Disp Refills   naloxegol oxalate (MOVANTIK) 25 MG TABS tablet 90 tablet 0    Sig: Take 1 tablet (25 mg total) by mouth daily.     Gastroenterology: Opioid-Induced Constipation - naloxegol Passed - 12/18/2023  4:33 PM      Passed - Cr in normal range and within 360 days    Creat  Date Value Ref Range Status  02/09/2015 0.55 0.50 - 1.10 mg/dL Final   Creatinine, Ser  Date Value Ref Range Status  09/19/2023 0.66 0.57 - 1.00 mg/dL Final   Creatinine,U  Date Value Ref Range Status  04/29/2010 71.9 mg/dL Final   Creatinine, Urine  Date Value Ref Range Status  03/22/2015 27.33 >20.0 mg/dL Final    Comment:    * (PPS) Presumptive positive screen result to be verified by         quantitative LC/MS or GC/MS confirmation testing.          Passed - Valid encounter within last 12 months    Recent Outpatient Visits           2 months ago Other fatigue   [place] [facility] [place] - [facility] Dept Of [place]. [facility], Marylene Land [place], [place]   3 months ago Type 2 diabetes mellitus with hyperglycemia, unspecified whether long term insulin use (HCC)   [place] [facility] - A Dept Of [place]. [facility], Marylene Land M, [place]   5 months ago Uncontrolled type 2 diabetes mellitus with hyperglycemia [facility])   [place] [facility] - A Dept Of [place]. [facility], Marylene Land M, [place]   7 months ago Uncontrolled type 2 diabetes mellitus with hyperglycemia [facility])   [place] [facility] - A Dept Of [place]. [facility] Claiborne Rigg, NP   11 months ago Chronic venous insufficiency    [place] [facility] [place] - A Dept Of [place]. [facility] Claiborne Rigg, [place]

## 2023-12-19 ENCOUNTER — Ambulatory Visit: Payer: Medicare HMO | Admitting: Vascular Surgery

## 2023-12-20 ENCOUNTER — Other Ambulatory Visit: Payer: Self-pay | Admitting: Nurse Practitioner

## 2023-12-20 DIAGNOSIS — F418 Other specified anxiety disorders: Secondary | ICD-10-CM

## 2023-12-21 NOTE — Telephone Encounter (Signed)
Requested medication (s) are due for refill today: Yes  Requested medication (s) are on the active medication list: Yes  Last refill:  10/13/23 #30, 1 refill  Future visit scheduled: No  Notes to clinic:  Unable to refill per protocol, cannot delegate.      Requested Prescriptions  Pending Prescriptions Disp Refills   zolpidem (AMBIEN) 5 MG tablet [Pharmacy Med Name: ZOLPIDEM TARTRATE 5 MG TABLET] 30 tablet 1    Sig: TAKE 1 TABLET BY MOUTH EVERYDAY AT BEDTIME     Not Delegated - Psychiatry:  Anxiolytics/Hypnotics Failed - 12/21/2023  9:42 AM      Failed - This refill cannot be delegated      Failed - Urine Drug Screen completed in last 360 days      Passed - Valid encounter within last 6 months    Recent Outpatient Visits           2 months ago Other fatigue   [place] [facility] - A Dept Of [place]. [facility], Marylene Land M, [place]   3 months ago Type 2 diabetes mellitus with hyperglycemia, unspecified whether long term insulin use (HCC)   [place] [facility] - A Dept Of [place]. [facility], Marylene Land M, [place]   5 months ago Uncontrolled type 2 diabetes mellitus with hyperglycemia [facility])   [place] [facility] - A Dept Of [place]. [facility], Marylene Land M, [place]   7 months ago Uncontrolled type 2 diabetes mellitus with hyperglycemia [facility])   [place] [facility] - A Dept Of [place]. [facility] Claiborne Rigg, NP   11 months ago Chronic venous insufficiency   [place] [facility] [place] - A Dept Of [place]. [facility] Claiborne Rigg, [place]

## 2023-12-24 ENCOUNTER — Other Ambulatory Visit: Payer: Self-pay | Admitting: Family Medicine

## 2023-12-24 DIAGNOSIS — I10 Essential (primary) hypertension: Secondary | ICD-10-CM

## 2023-12-24 NOTE — Telephone Encounter (Signed)
 Please advise

## 2023-12-26 ENCOUNTER — Ambulatory Visit: Payer: Medicare HMO | Admitting: Podiatry

## 2024-01-03 ENCOUNTER — Other Ambulatory Visit: Payer: Self-pay | Admitting: Family Medicine

## 2024-01-03 DIAGNOSIS — E785 Hyperlipidemia, unspecified: Secondary | ICD-10-CM

## 2024-01-03 NOTE — Telephone Encounter (Signed)
Requested by interface surescripts.  Requested Prescriptions  Pending Prescriptions Disp Refills   rosuvastatin (CRESTOR) 20 MG tablet [Pharmacy Med Name: ROSUVASTATIN CALCIUM 20 MG TAB] 90 tablet 0    Sig: TAKE 1 TABLET BY MOUTH EVERY DAY     Cardiovascular:  Antilipid - Statins 2 Failed - 01/03/2024 10:28 AM      Failed - Lipid Panel in normal range within the last 12 months    Cholesterol, Total  Date Value Ref Range Status  05/02/2023 111 100 - 199 mg/dL Final   LDL Chol Calc (NIH)  Date Value Ref Range Status  05/02/2023 39 0 - 99 mg/dL Final   Direct LDL  Date Value Ref Range Status  07/07/2009 101 (H) mg/dL Final    Comment:    See lab report for associated comment(s)   HDL  Date Value Ref Range Status  05/02/2023 39 (L) >39 mg/dL Final   Triglycerides  Date Value Ref Range Status  05/02/2023 205 (H) 0 - 149 mg/dL Final         Passed - Cr in normal range and within 360 days    Creat  Date Value Ref Range Status  02/09/2015 0.55 0.50 - 1.10 mg/dL Final   Creatinine, Ser  Date Value Ref Range Status  09/19/2023 0.66 0.57 - 1.00 mg/dL Final   Creatinine,U  Date Value Ref Range Status  04/29/2010 71.9 mg/dL Final   Creatinine, Urine  Date Value Ref Range Status  03/22/2015 27.33 >20.0 mg/dL Final    Comment:    * (PPS) Presumptive positive screen result to be verified by         quantitative LC/MS or GC/MS confirmation testing.          Passed - Patient is not pregnant      Passed - Valid encounter within last 12 months    Recent Outpatient Visits           3 months ago Other fatigue   [place] [facility] [place] - A Dept Of [place]. [facility], Marylene Land M, [place]   3 months ago Type 2 diabetes mellitus with hyperglycemia, unspecified whether long term insulin use (HCC)   [place] [facility] - A Dept Of [place]. [facility], Marylene Land M, [place]   5 months ago Uncontrolled type 2 diabetes mellitus with  hyperglycemia [facility])   [place] [facility] - A Dept Of [place]. [facility], Marylene Land M, [place]   8 months ago Uncontrolled type 2 diabetes mellitus with hyperglycemia [facility])   [place] [facility] - A Dept Of [place]. [facility] Claiborne Rigg, NP   11 months ago Chronic venous insufficiency   [place] [facility] [place] - A Dept Of . [facility] Claiborne Rigg, [place]

## 2024-01-12 ENCOUNTER — Other Ambulatory Visit: Payer: Self-pay | Admitting: Physician Assistant

## 2024-01-14 NOTE — Telephone Encounter (Signed)
 Rx 10/02/23 17each 3RF- too soon Requested Prescriptions  Pending Prescriptions Disp Refills   albuterol (VENTOLIN HFA) 108 (90 Base) MCG/ACT inhaler [Pharmacy Med Name: ALBUTEROL HFA (VENTOLIN) INH] 18 each 3    Sig: INHALE 2 PUFFS INTO THE LUNGS EVERY 4 HOURS AS NEEDED FOR WHEEZING OR SHORTNESS OF BREATH.     Pulmonology:  Beta Agonists 2 Passed - 01/14/2024  1:05 PM      Passed - Last BP in normal range    BP Readings from Last 1 Encounters:  09/27/23 137/81         Passed - Last Heart Rate in normal range    Pulse Readings from Last 1 Encounters:  09/27/23 93         Passed - Valid encounter within last 12 months    Recent Outpatient Visits           3 months ago Other fatigue   [place] [facility] [place] - A Dept Of [place]. [facility], Marylene Land M, [place]   3 months ago Type 2 diabetes mellitus with hyperglycemia, unspecified whether long term insulin use (HCC)   [place] [facility] - A Dept Of [place]. [facility], Marylene Land M, [place]   6 months ago Uncontrolled type 2 diabetes mellitus with hyperglycemia [facility])   [place] [facility] - A Dept Of [place]. [facility], Marylene Land M, [place]   8 months ago Uncontrolled type 2 diabetes mellitus with hyperglycemia [facility])   [place] [facility] - A Dept Of [place]. [facility] Claiborne Rigg, NP   11 months ago Chronic venous insufficiency   [place] [facility] [place] - A Dept Of . [facility] Claiborne Rigg, [place]

## 2024-01-15 ENCOUNTER — Other Ambulatory Visit: Payer: Self-pay | Admitting: Family Medicine

## 2024-01-15 DIAGNOSIS — M51369 Other intervertebral disc degeneration, lumbar region without mention of lumbar back pain or lower extremity pain: Secondary | ICD-10-CM

## 2024-01-17 DIAGNOSIS — Z6836 Body mass index (BMI) 36.0-36.9, adult: Secondary | ICD-10-CM | POA: Diagnosis not present

## 2024-01-17 DIAGNOSIS — R030 Elevated blood-pressure reading, without diagnosis of hypertension: Secondary | ICD-10-CM | POA: Diagnosis not present

## 2024-01-17 DIAGNOSIS — Z79899 Other long term (current) drug therapy: Secondary | ICD-10-CM | POA: Diagnosis not present

## 2024-01-17 DIAGNOSIS — M51369 Other intervertebral disc degeneration, lumbar region without mention of lumbar back pain or lower extremity pain: Secondary | ICD-10-CM | POA: Diagnosis not present

## 2024-01-17 DIAGNOSIS — E6609 Other obesity due to excess calories: Secondary | ICD-10-CM | POA: Diagnosis not present

## 2024-01-17 DIAGNOSIS — K5900 Constipation, unspecified: Secondary | ICD-10-CM | POA: Diagnosis not present

## 2024-01-17 DIAGNOSIS — G63 Polyneuropathy in diseases classified elsewhere: Secondary | ICD-10-CM | POA: Diagnosis not present

## 2024-01-17 DIAGNOSIS — Z6835 Body mass index (BMI) 35.0-35.9, adult: Secondary | ICD-10-CM | POA: Diagnosis not present

## 2024-01-19 ENCOUNTER — Other Ambulatory Visit: Payer: Self-pay | Admitting: Family Medicine

## 2024-01-19 DIAGNOSIS — E1165 Type 2 diabetes mellitus with hyperglycemia: Secondary | ICD-10-CM

## 2024-01-21 DIAGNOSIS — Z79899 Other long term (current) drug therapy: Secondary | ICD-10-CM | POA: Diagnosis not present

## 2024-01-29 ENCOUNTER — Encounter (HOSPITAL_COMMUNITY): Payer: Self-pay | Admitting: Emergency Medicine

## 2024-01-29 ENCOUNTER — Ambulatory Visit (HOSPITAL_COMMUNITY)
Admission: EM | Admit: 2024-01-29 | Discharge: 2024-01-29 | Disposition: A | Discharge status: Home / Self Care | Attending: Family Medicine | Admitting: Family Medicine

## 2024-01-29 DIAGNOSIS — J4521 Mild intermittent asthma with (acute) exacerbation: Secondary | ICD-10-CM | POA: Diagnosis not present

## 2024-01-29 DIAGNOSIS — J069 Acute upper respiratory infection, unspecified: Secondary | ICD-10-CM | POA: Diagnosis not present

## 2024-01-29 LAB — POCT INFLUENZA A/B
Influenza A, POC: NEGATIVE
Influenza B, POC: NEGATIVE

## 2024-01-29 MED ORDER — BENZONATATE 100 MG PO CAPS: 100.0000 mg | ORAL_CAPSULE | Freq: Three times a day (TID) | ORAL | 0 refills | Status: DC | PRN

## 2024-01-29 MED ORDER — ONDANSETRON 4 MG PO TBDP: 4.0000 mg | ORAL_TABLET | Freq: Three times a day (TID) | ORAL | 0 refills | Status: DC | PRN

## 2024-01-29 MED ORDER — PREDNISONE 20 MG PO TABS: 40.0000 mg | ORAL_TABLET | Freq: Every day | ORAL | 0 refills | Status: AC

## 2024-01-29 NOTE — ED Provider Notes (Signed)
 [facility]    CSN: [id number] Arrival date & time: 01/29/24  1849      History   Chief Complaint Chief Complaint  Patient presents with   Cough   Nasal Congestion    HPI Mary Gray is a 63 y.o. female.    Cough Here for cough and congestion and rhinorrhea and fever and chills.  She has also had aching and malaise and wheezing.  She has a lot of congestion in her chest.  She states she has had bronchitis in the past and asthma.  She is allergic to penicillin, tetracyclines, and codeine.  She has a history of diabetes and her sugars have been okay today.  Past Medical History:  Diagnosis Date   Asthma    Back pain    Chronic interstitial cystitis    Followed by Dr. Marcine Matar   COPD (chronic obstructive pulmonary disease) (HCC)    DDD (degenerative disc disease), lumbar    Degeneration of cervical intervertebral disc    Depression    Diabetes mellitus, type 2 (HCC) 2007   Heme positive stool    EGD on 04/08/2010 by Dr. Ewing Schlein showed chronic gastritis and a few gastric polyps; pathology showed a fundic gland polyp.  Colonoscopy on 04/08/2010 showed small external and internal hemorrhoids, and a few benign-appearing diminutive polyps in the rectum, the distal sigmoid colon, and in the distal descending colon; pathology showed hyperplastic polyps.    Hypertension    Hypothyroidism    Iron deficiency anemia    Peripheral arterial disease (HCC)    Spinal stenosis     Patient Active Problem List   Diagnosis Date Noted   Pain due to dental caries 06/15/2023   Dental infection 06/15/2023   Acute respiratory failure with hypoxia (HCC)    COVID-19 virus infection 01/20/2022   Lumbar spondylosis 03/22/2021   Chronic radicular lumbar pain 03/22/2021   Bilateral primary osteoarthritis of knee 03/22/2021   Chronic pain syndrome 03/22/2021   Tobacco use disorder 03/22/2021   Hyponatremia 03/04/2021   Chronic bilateral low back pain without sciatica  12/13/2020   Sleep disturbance 12/13/2020   Vaginitis 10/27/2020   Right ear pain 10/13/2020   Nausea 02/10/2020   B12 deficiency 11/25/2018   Urinary, incontinence, stress female 09/09/2018   Primary osteoarthritis of right knee 05/01/2018   Chronic venous insufficiency 02/26/2017   Candidal skin infection 03/03/2014   Preventative health care 05/14/2013   Spinal stenosis, lumbar region, with neurogenic claudication 03/01/2011   Hypothyroidism 07/06/2006   Diabetes mellitus type 2, uncontrolled 07/06/2006   Hyperlipemia 07/06/2006   Generalized anxiety disorder 07/06/2006   Essential hypertension 07/06/2006   GERD 07/06/2006   Lumbar degenerative disc disease 07/06/2006    Past Surgical History:  Procedure Laterality Date   CARPAL TUNNEL RELEASE  05/08/2000   By Dr. Katy Fitch. Sypher, Montez Hageman.    OB History   No obstetric history on file.      Home Medications    Prior to Admission medications   Medication Sig Start Date End Date Taking? Authorizing Provider  benzonatate (TESSALON) 100 MG capsule Take 1 capsule (100 mg total) by mouth 3 (three) times daily as needed for cough. 01/29/24  Yes Zenia Resides, MD  ondansetron (ZOFRAN-ODT) 4 MG disintegrating tablet Take 1 tablet (4 mg total) by mouth every 8 (eight) hours as needed for nausea or vomiting. 01/29/24  Yes Aliene Tamura, Janace Aris, MD  predniSONE (DELTASONE) 20 MG tablet Take 2 tablets (40 mg  total) by mouth daily with breakfast for 5 days. 01/29/24 02/03/24 Yes Zenia Resides, MD  Accu-Chek Softclix Lancets lancets Check blood sugar 1 time per day 11/01/23   Hoy Register, MD  acetaminophen (TYLENOL) 500 MG tablet Take 1,000 mg by mouth every 6 (six) hours as needed for mild pain.    [provider]  albuterol (PROVENTIL) (2.5 MG/3ML) 0.083% nebulizer solution USE 1 VIAL IN NEBULIZER 4 TIMES DAILY - As Needed 11/13/23   Claiborne Rigg, NP  albuterol (VENTOLIN HFA) 108 (90 Base) MCG/ACT inhaler Inhale 2 puffs  into the lungs every 4 (four) hours as needed for wheezing or shortness of breath. 10/02/23   Anders Simmonds, PA-C  amitriptyline (ELAVIL) 25 MG tablet TAKE 1 TABLET BY MOUTH EVERYDAY AT BEDTIME 07/17/23   Hoy Register, MD  amLODipine (NORVASC) 2.5 MG tablet Take 1 tablet (2.5 mg total) by mouth daily. 06/19/23   Nahser, Deloris Ping, MD  aspirin EC 81 MG tablet Take 81 mg by mouth daily. Swallow whole.    [provider]  Blood Glucose Monitoring Suppl (ACCU-CHEK GUIDE) w/Device KIT Use to check blood sugar 1 times daily. 11/05/23   Hoy Register, MD  [employer] VITAMIN B12 1000 MCG tablet TAKE 1 TABLET BY MOUTH EVERY DAY Patient taking differently: Take 1,000 mcg by mouth daily. 01/12/21   Tyson Alias, MD  cyclobenzaprine (FLEXERIL) 5 MG tablet TAKE 1 TABLET BY MOUTH EVERYDAY AT BEDTIME 11/15/23   Newlin, Odette Horns, MD  enalapril (VASOTEC) 20 MG tablet TAKE 1 TABLET BY MOUTH EVERY DAY 12/24/23   Hoy Register, MD  esomeprazole (NEXIUM) 40 MG capsule TAKE 1 CAPSULE (40 MG TOTAL) BY MOUTH DAILY. 09/14/23 03/12/24  Claiborne Rigg, NP  fluticasone-salmeterol [facility] INHUB) 250-50 MCG/ACT AEPB INHALE 1 PUFF INTO THE LUNGS IN THE MORNING AND AT BEDTIME. 11/05/23   Claiborne Rigg, NP  gabapentin (NEURONTIN) 600 MG tablet TAKE 1 TABLET BY MOUTH THREE TIMES A DAY 04/25/23   Claiborne Rigg, NP  glucose blood (ACCU-CHEK GUIDE TEST) test strip Use to check blood sugar 1 times daily. 11/05/23   Hoy Register, MD  ipratropium-albuterol (DUONEB) 0.5-2.5 (3) MG/3ML SOLN Take 3 mLs by nebulization every 4 (four) hours as needed. 07/13/23   Claiborne Rigg, NP  Iron, Ferrous Sulfate, 325 (65 Fe) MG TABS Take 325 mg by mouth daily. 10/02/23   Anders Simmonds, PA-C  levocetirizine (XYZAL) 5 MG tablet Take 1 tablet (5 mg total) by mouth every evening. 03/23/23   Wallis Bamberg, PA-C  levothyroxine (SYNTHROID) 88 MCG tablet Take 1 tablet (88 mcg total) by mouth daily before breakfast. 09/19/23   McClung,  Marzella Schlein, PA-C  meloxicam (MOBIC) 7.5 MG tablet TAKE 1 TABLET BY MOUTH EVERY DAY 01/16/24   Hoy Register, MD  metFORMIN (GLUCOPHAGE-XR) 500 MG 24 hr tablet Take 2 tablets (1,000 mg total) by mouth 2 (two) times daily. 09/19/23   Anders Simmonds, PA-C  Misc. Devices MISC Provide patient with shower chair and grab bar. ICD10 M17.10 M54.16 03/03/23   Claiborne Rigg, NP  MOVANTIK 25 MG TABS tablet TAKE 1 TABLET (25 MG TOTAL) BY MOUTH DAILY. 12/18/23   Claiborne Rigg, NP  multivitamin-iron-minerals-folic acid (CENTRUM) chewable tablet Chew 1 tablet by mouth daily. 07/12/20   Tyson Alias, MD  nystatin (NYSTATIN) powder Apply 1 application topically 3 (three) times daily. 03/10/21   [name], MD  nystatin cream (MYCOSTATIN) Apply 1 Application topically 2 (two) times daily.  01/03/23   Claiborne Rigg, NP  PREMARIN vaginal cream PLACE 1 APPLICATORFUL VAGINALLY DAILY. Patient taking differently: 1 applicator 2 (two) times a week. 11/23/21   Tyson Alias, MD  rOPINIRole (REQUIP) 2 MG tablet TAKE 1 TABLET BY MOUTH AT BEDTIME. 10/22/23   Louann Sjogren, DPM  rosuvastatin (CRESTOR) 20 MG tablet TAKE 1 TABLET BY MOUTH EVERY DAY 01/03/24   Hoy Register, MD  Semaglutide,0.25 or 0.5MG /DOS, (OZEMPIC, 0.25 OR 0.5 MG/DOSE,) 2 MG/3ML SOPN Inject 0.25 mg into the skin once a week. Please make appt with Bertram Denver for more refills. Last two visits have been with a covering provider. 11/25/23   Hoy Register, MD  Tiotropium Bromide Monohydrate (SPIRIVA RESPIMAT) 2.5 MCG/ACT AERS Inhale 2 puffs into the lungs daily. 12/30/22   Allwardt, Crist Infante, PA-C  traMADol (ULTRAM) 50 MG tablet Take 50 mg by mouth every 6 (six) hours as needed for moderate pain (pain score 4-6).    [provider]  triamcinolone (KENALOG) 0.025 % ointment APPLY TO AFFECTED AREA TWICE A DAY 05/22/23   Hoy Register, MD  zolpidem (AMBIEN) 5 MG tablet TAKE 1 TABLET BY MOUTH EVERYDAY AT BEDTIME 12/24/23   Claiborne Rigg, NP    Family History Family History  Problem Relation Age of Onset   Heart attack Father 23   Diabetes Mother    Hypertension Mother    Diabetes Sister    Breast cancer Neg Hx    Colon cancer Neg Hx     Social History Social History   Tobacco Use   Smoking status: Some Days    Current packs/day: 0.50    Average packs/day: 0.5 packs/day for 45.0 years (22.5 ttl pk-yrs)    Types: Cigarettes   Smokeless tobacco: Never   Tobacco comments:    1/2 pack per day  Vaping Use   Vaping status: Never Used  Substance Use Topics   Alcohol use: Yes    Alcohol/week: 1.0 standard drink of alcohol    Types: 1 Cans of beer per week    Comment: Occ   Drug use: No     Allergies   Amoxicillin, Codeine sulfate, Meperidine and related, Minocycline hcl, Penicillins, Propoxyphene n-acetaminophen, and Tetracycline hcl   Review of Systems Review of Systems  Respiratory:  Positive for cough.      Physical Exam Triage Vital Signs ED Triage Vitals  Encounter Vitals Group     BP 01/29/24 2013 121/77     Systolic BP Percentile --      Diastolic BP Percentile --      Pulse Rate 01/29/24 2013 (!) 106     Resp 01/29/24 2013 18     Temp 01/29/24 2013 98.9 F (37.2 C)     Temp Source 01/29/24 2013 Oral     SpO2 01/29/24 2013 90 %     Weight --      Height --      Head Circumference --      Peak Flow --      Pain Score 01/29/24 2012 8     Pain Loc --      Pain Education --      Exclude from Growth Chart --    No data found.  Updated Vital Signs BP 121/77 (BP Location: Right Arm)   Pulse (!) 106   Temp 98.9 F (37.2 C) (Oral)   Resp 18   LMP 01/17/2009   SpO2 90%   Visual Acuity Right Eye Distance:  Left Eye Distance:   Bilateral Distance:    Right Eye Near:   Left Eye Near:    Bilateral Near:     Physical Exam Vitals reviewed.  Constitutional:      General: She is not in acute distress.    Appearance: She is not toxic-appearing.  HENT:     Right Ear:  Tympanic membrane and ear canal normal.     Left Ear: Tympanic membrane and ear canal normal.     Nose: Congestion present.     Mouth/Throat:     Mouth: Mucous membranes are moist.     Comments: There is clear exudate draining in the oropharynx with some posterior erythema. Eyes:     Extraocular Movements: Extraocular movements intact.     Conjunctiva/sclera: Conjunctivae normal.     Pupils: Pupils are equal, round, and reactive to light.  Cardiovascular:     Rate and Rhythm: Normal rate and regular rhythm.     Heart sounds: No murmur heard. Pulmonary:     Breath sounds: No stridor. No rhonchi or rales.     Comments: Air movement is good, but she has expiratory wheezes throughout her lung fields. Chest:     Chest wall: No tenderness.  Musculoskeletal:     Cervical back: Neck supple.  Lymphadenopathy:     Cervical: No cervical adenopathy.  Skin:    Capillary Refill: Capillary refill takes less than 2 seconds.     Coloration: Skin is not jaundiced or pale.  Neurological:     General: No focal deficit present.     Mental Status: She is alert and oriented to person, place, and time.  Psychiatric:        Behavior: Behavior normal.      UC Treatments / Results  Labs (all labs ordered are listed, but only abnormal results are displayed) Labs Reviewed  SARS CORONAVIRUS 2 (TAT 6-24 HRS)  POCT INFLUENZA A/B    EKG   Radiology No results found.  Procedures Procedures (including critical care time)  Medications Ordered in UC Medications - No data to display  Initial Impression / Assessment and Plan / UC Course  I have reviewed the triage vital signs and the nursing notes.  Pertinent labs & imaging results that were available during my care of the patient were reviewed by me and considered in my medical decision making (see chart for details).     Flu test is negative.   COVID swab is done and if positive our staff will notify her.  If positive she is a candidate for  Paxlovid.  Her last EGFR was 99.  Zofran is sent in for nausea and prednisone is sent in for the asthma/copd exacerbation.  Tessalon Perles are sent in for cough Final Clinical Impressions(s) / UC Diagnoses   Final diagnoses:  Viral URI with cough  Mild intermittent asthma with acute exacerbation     Discharge Instructions      The test for flu was negative.   You have been swabbed for COVID, and the test will result in the next 24 hours. Our staff will call you if positive. If the COVID test is positive, you should quarantine until you are fever free for 24 hours and you are starting to feel better, and then take added precautions for the next 5 days, such as physical distancing/wearing a mask and good hand hygiene/washing.  Take prednisone 20 mg--2 daily for 5 days  Take benzonatate 100 mg, 1 tab every 8 hours as  needed for cough.  Ondansetron dissolved in the mouth every 8 hours as needed for nausea or vomiting. Clear liquids(water, gatorade/pedialyte, ginger ale/sprite, chicken broth/soup) and bland things(crackers/toast, rice, potato, bananas) to eat. Avoid acidic foods like lemon/lime/orange/tomato, and avoid greasy/spicy foods.      ED Prescriptions     Medication Sig Dispense Auth. Provider   benzonatate (TESSALON) 100 MG capsule Take 1 capsule (100 mg total) by mouth 3 (three) times daily as needed for cough. 21 capsule Zenia Resides, MD   predniSONE (DELTASONE) 20 MG tablet Take 2 tablets (40 mg total) by mouth daily with breakfast for 5 days. 10 tablet Zenia Resides, MD   ondansetron (ZOFRAN-ODT) 4 MG disintegrating tablet Take 1 tablet (4 mg total) by mouth every 8 (eight) hours as needed for nausea or vomiting. 10 tablet Marlinda Mike Janace Aris, MD      PDMP not reviewed this encounter.   Zenia Resides, MD 01/29/24 2055

## 2024-01-29 NOTE — ED Triage Notes (Signed)
 Pt c/o cough, congestion, body aches that started Sunday. Reports stomach hurts from coughing. Taking extra strength Tylenol and Nyquil.

## 2024-01-29 NOTE — Discharge Instructions (Addendum)
 The test for flu was negative.   You have been swabbed for COVID, and the test will result in the next 24 hours. Our staff will call you if positive. If the COVID test is positive, you should quarantine until you are fever free for 24 hours and you are starting to feel better, and then take added precautions for the next 5 days, such as physical distancing/wearing a mask and good hand hygiene/washing.  Take prednisone 20 mg--2 daily for 5 days  Take benzonatate 100 mg, 1 tab every 8 hours as needed for cough.  Ondansetron dissolved in the mouth every 8 hours as needed for nausea or vomiting. Clear liquids(water, gatorade/pedialyte, ginger ale/sprite, chicken broth/soup) and bland things(crackers/toast, rice, potato, bananas) to eat. Avoid acidic foods like lemon/lime/orange/tomato, and avoid greasy/spicy foods.

## 2024-01-30 LAB — SARS CORONAVIRUS 2 (TAT 6-24 HRS): SARS Coronavirus 2: NEGATIVE

## 2024-02-18 DIAGNOSIS — G63 Polyneuropathy in diseases classified elsewhere: Secondary | ICD-10-CM | POA: Diagnosis not present

## 2024-02-18 DIAGNOSIS — Z6835 Body mass index (BMI) 35.0-35.9, adult: Secondary | ICD-10-CM | POA: Diagnosis not present

## 2024-02-18 DIAGNOSIS — K5900 Constipation, unspecified: Secondary | ICD-10-CM | POA: Diagnosis not present

## 2024-02-18 DIAGNOSIS — M51369 Other intervertebral disc degeneration, lumbar region without mention of lumbar back pain or lower extremity pain: Secondary | ICD-10-CM | POA: Diagnosis not present

## 2024-02-18 DIAGNOSIS — E6609 Other obesity due to excess calories: Secondary | ICD-10-CM | POA: Diagnosis not present

## 2024-02-18 DIAGNOSIS — Z6836 Body mass index (BMI) 36.0-36.9, adult: Secondary | ICD-10-CM | POA: Diagnosis not present

## 2024-02-18 DIAGNOSIS — Z79899 Other long term (current) drug therapy: Secondary | ICD-10-CM | POA: Diagnosis not present

## 2024-02-18 DIAGNOSIS — R030 Elevated blood-pressure reading, without diagnosis of hypertension: Secondary | ICD-10-CM | POA: Diagnosis not present

## 2024-02-22 ENCOUNTER — Other Ambulatory Visit: Payer: Self-pay | Admitting: Family Medicine

## 2024-02-22 DIAGNOSIS — E1165 Type 2 diabetes mellitus with hyperglycemia: Secondary | ICD-10-CM

## 2024-03-11 ENCOUNTER — Other Ambulatory Visit: Payer: Self-pay | Admitting: Nurse Practitioner

## 2024-03-11 DIAGNOSIS — K219 Gastro-esophageal reflux disease without esophagitis: Secondary | ICD-10-CM

## 2024-03-14 ENCOUNTER — Other Ambulatory Visit: Payer: Self-pay | Admitting: Nurse Practitioner

## 2024-03-14 DIAGNOSIS — F418 Other specified anxiety disorders: Secondary | ICD-10-CM

## 2024-03-17 NOTE — Telephone Encounter (Signed)
 Requested medication (s) are due for refill today: yes  Requested medication (s) are on the active medication list: yes  Last refill:  12/24/23  Future visit scheduled: no  Notes to clinic:  Unable to refill per protocol, cannot delegate.      Requested Prescriptions  Pending Prescriptions Disp Refills   zolpidem  (AMBIEN ) 5 MG tablet [Pharmacy Med Name: ZOLPIDEM  TARTRATE 5 MG TABLET] 30 tablet 1    Sig: TAKE 1 TABLET BY MOUTH EVERYDAY AT BEDTIME     Not Delegated - Psychiatry:  Anxiolytics/Hypnotics Failed - 03/17/2024  8:50 AM      Failed - This refill cannot be delegated      Failed - Urine Drug Screen completed in last 360 days      Passed - Valid encounter within last 6 months    Recent Outpatient Visits           5 months ago Other fatigue   [facility] [place] - A Dept Of [place]. [facility], Shelvy Dickens M, [place]   6 months ago Type 2 diabetes mellitus with hyperglycemia, unspecified whether long term insulin  use [facility])   [facility] - A Dept Of [place]. [facility], Shelvy Dickens M, [place]   8 months ago Uncontrolled type 2 diabetes mellitus with hyperglycemia [facility])   [facility] - A Dept Of [place]. [facility], Shelvy Dickens M, [place]   10 months ago Uncontrolled type 2 diabetes mellitus with hyperglycemia [facility])   [facility] [facility] - A Dept Of [place]. [facility] Collins Dean, NP   1 year ago Chronic venous insufficiency   [facility] - A Dept Of [place]. [facility] Collins Dean, [place]

## 2024-03-24 ENCOUNTER — Other Ambulatory Visit: Payer: Self-pay | Admitting: Family Medicine

## 2024-03-24 DIAGNOSIS — I10 Essential (primary) hypertension: Secondary | ICD-10-CM

## 2024-04-02 ENCOUNTER — Other Ambulatory Visit: Payer: Self-pay | Admitting: Family Medicine

## 2024-04-02 DIAGNOSIS — K219 Gastro-esophageal reflux disease without esophagitis: Secondary | ICD-10-CM

## 2024-04-06 ENCOUNTER — Other Ambulatory Visit: Payer: Self-pay | Admitting: Family Medicine

## 2024-04-06 DIAGNOSIS — E785 Hyperlipidemia, unspecified: Secondary | ICD-10-CM

## 2024-04-06 DIAGNOSIS — K219 Gastro-esophageal reflux disease without esophagitis: Secondary | ICD-10-CM

## 2024-04-19 ENCOUNTER — Other Ambulatory Visit: Payer: Self-pay | Admitting: Podiatry

## 2024-04-20 ENCOUNTER — Other Ambulatory Visit: Payer: Self-pay | Admitting: Family Medicine

## 2024-04-20 DIAGNOSIS — M51369 Other intervertebral disc degeneration, lumbar region without mention of lumbar back pain or lower extremity pain: Secondary | ICD-10-CM

## 2024-04-21 NOTE — Telephone Encounter (Signed)
 Requested medication (s) are due for refill today -yes  Requested medication (s) are on the active medication list -yes  Future visit scheduled -no  Last refill: 01/16/24 #90  Notes to clinic: abnormal lab values- sent for review   Requested Prescriptions  Pending Prescriptions Disp Refills   meloxicam  (MOBIC ) 7.5 MG tablet [Pharmacy Med Name: MELOXICAM  7.5 MG TABLET] 90 tablet 0    Sig: TAKE 1 TABLET BY MOUTH EVERY DAY     Analgesics:  COX2 Inhibitors Failed - 04/21/2024  3:57 PM      Failed - Manual Review: Labs are only required if the patient has taken medication for more than 8 weeks.      Failed - HGB in normal range and within 360 days    Hemoglobin  Date Value Ref Range Status  09/27/2023 10.1 (L) 11.1 - 15.9 g/dL Final         Failed - HCT in normal range and within 360 days    Hematocrit  Date Value Ref Range Status  09/27/2023 32.3 (L) 34.0 - 46.6 % Final         Passed - Cr in normal range and within 360 days    Creat  Date Value Ref Range Status  02/09/2015 0.55 0.50 - 1.10 mg/dL Final   Creatinine, Ser  Date Value Ref Range Status  09/19/2023 0.66 0.57 - 1.00 mg/dL Final   Creatinine,U  Date Value Ref Range Status  04/29/2010 71.9 mg/dL Final   Creatinine, Urine  Date Value Ref Range Status  03/22/2015 27.33 >20.0 mg/dL Final    Comment:    * (PPS) Presumptive positive screen result to be verified by         quantitative LC/MS or GC/MS confirmation testing.          Passed - AST in normal range and within 360 days    AST  Date Value Ref Range Status  09/19/2023 37 0 - 40 IU/L Final         Passed - ALT in normal range and within 360 days    ALT  Date Value Ref Range Status  09/19/2023 26 0 - 32 IU/L Final         Passed - eGFR is 30 or above and within 360 days    GFR, Est African American  Date Value Ref Range Status  02/09/2015 >89 mL/min Final   GFR calc Af Amer  Date Value Ref Range Status  02/20/2020 >60 >60 mL/min Final   GFR,  Est Non African American  Date Value Ref Range Status  02/09/2015 >89 mL/min Final    Comment:      The estimated GFR is a calculation valid for adults (>=72 years old) that uses the CKD-EPI algorithm to adjust for age and sex. It is   not to be used for children, pregnant women, hospitalized patients,    patients on dialysis, or with rapidly changing kidney function. According to the NKDEP, eGFR >89 is normal, 60-89 shows mild impairment, 30-59 shows moderate impairment, 15-29 shows severe impairment and <15 is ESRD.      GFR, Estimated  Date Value Ref Range Status  02/28/2023 >60 >60 mL/min Final    Comment:    (NOTE) Calculated using the CKD-EPI Creatinine Equation (2021)    eGFR  Date Value Ref Range Status  09/19/2023 99 >59 mL/min/1.73 Final         Passed - Patient is not pregnant      Passed -  Valid encounter within last 12 months    Recent Outpatient Visits           6 months ago Other fatigue   [facility] - [facility] Dept Of [place]. [facility], Shelvy Dickens M, [place]   7 months ago Type 2 diabetes mellitus with hyperglycemia, unspecified whether long term insulin  use [facility])   [facility] - [facility] Dept Of [place]. [facility], Shelvy Dickens M, [place]   9 months ago Uncontrolled type 2 diabetes mellitus with hyperglycemia [facility])   [facility] [facility] - A Dept Of [place]. [facility], Shelvy Dickens M, [place]   11 months ago Uncontrolled type 2 diabetes mellitus with hyperglycemia [facility])   [place] [facility] - A Dept Of [place]. [facility] Collins Dean, NP   1 year ago Chronic venous insufficiency   [facility] [place] - A Dept Of [place]. [facility] Collins Dean, NP                 Requested Prescriptions  Pending Prescriptions Disp Refills   meloxicam  (MOBIC ) 7.5 MG tablet [Pharmacy Med Name: MELOXICAM  7.5 MG TABLET] 90 tablet 0     Sig: TAKE 1 TABLET BY MOUTH EVERY DAY     Analgesics:  COX2 Inhibitors Failed - 04/21/2024  3:57 PM      Failed - Manual Review: Labs are only required if the patient has taken medication for more than 8 weeks.      Failed - HGB in normal range and within 360 days    Hemoglobin  Date Value Ref Range Status  09/27/2023 10.1 (L) 11.1 - 15.9 g/dL Final         Failed - HCT in normal range and within 360 days    Hematocrit  Date Value Ref Range Status  09/27/2023 32.3 (L) 34.0 - 46.6 % Final         Passed - Cr in normal range and within 360 days    Creat  Date Value Ref Range Status  02/09/2015 0.55 0.50 - 1.10 mg/dL Final   Creatinine, Ser  Date Value Ref Range Status  09/19/2023 0.66 0.57 - 1.00 mg/dL Final   Creatinine,U  Date Value Ref Range Status  04/29/2010 71.9 mg/dL Final   Creatinine, Urine  Date Value Ref Range Status  03/22/2015 27.33 >20.0 mg/dL Final    Comment:    * (PPS) Presumptive positive screen result to be verified by         quantitative LC/MS or GC/MS confirmation testing.          Passed - AST in normal range and within 360 days    AST  Date Value Ref Range Status  09/19/2023 37 0 - 40 IU/L Final         Passed - ALT in normal range and within 360 days    ALT  Date Value Ref Range Status  09/19/2023 26 0 - 32 IU/L Final         Passed - eGFR is 30 or above and within 360 days    GFR, Est African American  Date Value Ref Range Status  02/09/2015 >89 mL/min Final   GFR calc Af Amer  Date Value Ref Range Status  02/20/2020 >60 >60 mL/min Final   GFR, Est Non African American  Date Value Ref Range Status  02/09/2015 >89 mL/min  Final    Comment:      The estimated GFR is a calculation valid for adults (>=72 years old) that uses the CKD-EPI algorithm to adjust for age and sex. It is   not to be used for children, pregnant women, hospitalized patients,    patients on dialysis, or with rapidly changing kidney function. According to  the NKDEP, eGFR >89 is normal, 60-89 shows mild impairment, 30-59 shows moderate impairment, 15-29 shows severe impairment and <15 is ESRD.      GFR, Estimated  Date Value Ref Range Status  02/28/2023 >60 >60 mL/min Final    Comment:    (NOTE) Calculated using the CKD-EPI Creatinine Equation (2021)    eGFR  Date Value Ref Range Status  09/19/2023 99 >59 mL/min/1.73 Final         Passed - Patient is not pregnant      Passed - Valid encounter within last 12 months    Recent Outpatient Visits           6 months ago Other fatigue   [facility] [place] - [facility] Dept Of [place]. [facility], Shelvy Dickens M, [place]   7 months ago Type 2 diabetes mellitus with hyperglycemia, unspecified whether long term insulin  use [facility])   [facility] [facility] - A Dept Of [place]. [facility], Shelvy Dickens M, [place]   9 months ago Uncontrolled type 2 diabetes mellitus with hyperglycemia [facility])   [facility] - A Dept Of [place]. [facility], Shelvy Dickens M, [place]   11 months ago Uncontrolled type 2 diabetes mellitus with hyperglycemia [facility])   [place] [facility] - A Dept Of [place]. [facility] Collins Dean, NP   1 year ago Chronic venous insufficiency   [place] [facility] [place] [facility] - A Dept Of  [place]. [facility] Collins Dean, [place]

## 2024-04-23 ENCOUNTER — Other Ambulatory Visit: Payer: Self-pay | Admitting: Family Medicine

## 2024-04-23 DIAGNOSIS — I10 Essential (primary) hypertension: Secondary | ICD-10-CM

## 2024-04-30 ENCOUNTER — Other Ambulatory Visit: Payer: Self-pay | Admitting: Family Medicine

## 2024-04-30 DIAGNOSIS — E785 Hyperlipidemia, unspecified: Secondary | ICD-10-CM

## 2024-04-30 DIAGNOSIS — K219 Gastro-esophageal reflux disease without esophagitis: Secondary | ICD-10-CM

## 2024-05-04 ENCOUNTER — Other Ambulatory Visit: Payer: Self-pay | Admitting: Family Medicine

## 2024-05-04 DIAGNOSIS — E785 Hyperlipidemia, unspecified: Secondary | ICD-10-CM

## 2024-05-04 DIAGNOSIS — K219 Gastro-esophageal reflux disease without esophagitis: Secondary | ICD-10-CM

## 2024-05-19 ENCOUNTER — Other Ambulatory Visit: Payer: Self-pay | Admitting: Family Medicine

## 2024-05-19 ENCOUNTER — Other Ambulatory Visit: Payer: Self-pay | Admitting: Nurse Practitioner

## 2024-05-19 DIAGNOSIS — M51369 Other intervertebral disc degeneration, lumbar region without mention of lumbar back pain or lower extremity pain: Secondary | ICD-10-CM

## 2024-05-19 DIAGNOSIS — I10 Essential (primary) hypertension: Secondary | ICD-10-CM

## 2024-05-19 NOTE — Telephone Encounter (Unsigned)
 Copied from CRM [id number]. Topic: Clinical - Medication Refill >> May 19, 2024  4:18 PM Ivette P wrote: Medication: enalapril  (VASOTEC ) 20 MG tablet  Has the patient contacted their pharmacy? Yes (Agent: If no, request that the patient contact the pharmacy for the refill. If patient does not wish to contact the pharmacy document the reason why and proceed with request.) (Agent: If yes, when and what did the pharmacy advise?)  This is the patient's preferred pharmacy:  [id number] [place], [place] - [address] [place] [address] Phone: [phone] Fax: [phone]  Is this the correct pharmacy for this prescription? Yes If no, delete pharmacy and type the correct one.   Has the prescription been filled recently? No  Is the patient out of the medication? Yes  Has the patient been seen for an appointment in the last year OR does the patient have an upcoming appointment? Yes  Can we respond through MyChart? no  Agent: Please be advised that Rx refills may take up to 3 business days. We ask that you follow-up with your pharmacy.

## 2024-05-20 NOTE — Telephone Encounter (Signed)
 Requested Prescriptions  Pending Prescriptions Disp Refills   meloxicam  (MOBIC ) 7.5 MG tablet [Pharmacy Med Name: MELOXICAM  7.5 MG TABLET] 90 tablet 0    Sig: TAKE 1 TABLET BY MOUTH EVERY DAY *NEED APPOINTMENT FOR REFILLS     Analgesics:  COX2 Inhibitors Failed - 05/20/2024  2:57 PM      Failed - Manual Review: Labs are only required if the patient has taken medication for more than 8 weeks.      Failed - HGB in normal range and within 360 days    Hemoglobin  Date Value Ref Range Status  09/27/2023 10.1 (L) 11.1 - 15.9 g/dL Final         Failed - HCT in normal range and within 360 days    Hematocrit  Date Value Ref Range Status  09/27/2023 32.3 (L) 34.0 - 46.6 % Final         Passed - Cr in normal range and within 360 days    Creat  Date Value Ref Range Status  02/09/2015 0.55 0.50 - 1.10 mg/dL Final   Creatinine, Ser  Date Value Ref Range Status  09/19/2023 0.66 0.57 - 1.00 mg/dL Final   Creatinine,U  Date Value Ref Range Status  04/29/2010 71.9 mg/dL Final   Creatinine, Urine  Date Value Ref Range Status  03/22/2015 27.33 >20.0 mg/dL Final    Comment:    * (PPS) Presumptive positive screen result to be verified by         quantitative LC/MS or GC/MS confirmation testing.          Passed - AST in normal range and within 360 days    AST  Date Value Ref Range Status  09/19/2023 37 0 - 40 IU/L Final         Passed - ALT in normal range and within 360 days    ALT  Date Value Ref Range Status  09/19/2023 26 0 - 32 IU/L Final         Passed - eGFR is 30 or above and within 360 days    GFR, Est African American  Date Value Ref Range Status  02/09/2015 >89 mL/min Final   GFR calc Af Amer  Date Value Ref Range Status  02/20/2020 >60 >60 mL/min Final   GFR, Est Non African American  Date Value Ref Range Status  02/09/2015 >89 mL/min Final    Comment:      The estimated GFR is a calculation valid for adults (>=46 years old) that uses the CKD-EPI algorithm to  adjust for age and sex. It is   not to be used for children, pregnant women, hospitalized patients,    patients on dialysis, or with rapidly changing kidney function. According to the NKDEP, eGFR >89 is normal, 60-89 shows mild impairment, 30-59 shows moderate impairment, 15-29 shows severe impairment and <15 is ESRD.      GFR, Estimated  Date Value Ref Range Status  02/28/2023 >60 >60 mL/min Final    Comment:    (NOTE) Calculated using the CKD-EPI Creatinine Equation (2021)    eGFR  Date Value Ref Range Status  09/19/2023 99 >59 mL/min/1.73 Final         Passed - Patient is not pregnant      Passed - Valid encounter within last 12 months    Recent Outpatient Visits           7 months ago Other fatigue   [place] [facility]  Of [place]. [facility], [place] M, [place]   8 months ago Type 2 diabetes mellitus with hyperglycemia, unspecified whether long term insulin  use [facility])   [place] [facility] Dept Of [place]. [facility], [place] M, [place]   10 months ago Uncontrolled type 2 diabetes mellitus with hyperglycemia [facility])   [place] [facility] - [facility] Dept Of [place]. [facility] [place], [place], [place]   1 year ago Uncontrolled type 2 diabetes mellitus with hyperglycemia [facility])   [place] [facility] - A Dept Of [place]. [facility] Theotis Haze ORN, NP   1 year ago Chronic venous insufficiency    [facility] - [facility] Dept Of [place]. [facility] Theotis Haze ORN, [place]

## 2024-05-21 ENCOUNTER — Ambulatory Visit: Payer: Self-pay

## 2024-05-21 ENCOUNTER — Other Ambulatory Visit: Payer: Self-pay | Admitting: Nurse Practitioner

## 2024-05-21 ENCOUNTER — Telehealth: Payer: Self-pay | Admitting: Nurse Practitioner

## 2024-05-21 DIAGNOSIS — I10 Essential (primary) hypertension: Secondary | ICD-10-CM

## 2024-05-21 DIAGNOSIS — F418 Other specified anxiety disorders: Secondary | ICD-10-CM

## 2024-05-21 MED ORDER — ENALAPRIL MALEATE 20 MG PO TABS: 20.0000 mg | ORAL_TABLET | Freq: Every day | ORAL | 0 refills | Status: DC

## 2024-05-21 NOTE — Telephone Encounter (Signed)
 Requested medication (s) are due for refill today -yes  Requested medication (s) are on the active medication list -yes  Future visit scheduled -yes  Last refill: 03/24/24 #30  Notes to clinic: Last OV 09/27/23- upcoming appt 06/20/24- last RF has notes- must have OV- sent for review for short term Rx  Requested Prescriptions  Pending Prescriptions Disp Refills   enalapril  (VASOTEC ) 20 MG tablet 30 tablet 0    Sig: Take 1 tablet (20 mg total) by mouth daily. Please schedule an appointment for additional refills.     Cardiovascular:  ACE Inhibitors Failed - 05/21/2024  1:24 PM      Failed - Cr in normal range and within 180 days    Creat  Date Value Ref Range Status  02/09/2015 0.55 0.50 - 1.10 mg/dL Final   Creatinine, Ser  Date Value Ref Range Status  09/19/2023 0.66 0.57 - 1.00 mg/dL Final   Creatinine,U  Date Value Ref Range Status  04/29/2010 71.9 mg/dL Final   Creatinine, Urine  Date Value Ref Range Status  03/22/2015 27.33 >20.0 mg/dL Final    Comment:    * (PPS) Presumptive positive screen result to be verified by         quantitative LC/MS or GC/MS confirmation testing.          Failed - K in normal range and within 180 days    Potassium  Date Value Ref Range Status  09/19/2023 4.3 3.5 - 5.2 mmol/L Final         Failed - Valid encounter within last 6 months    Recent Outpatient Visits           7 months ago Other fatigue   [place] [facility] Dept Of [place]. [facility], [place], [place]   8 months ago Type 2 diabetes mellitus with hyperglycemia, unspecified whether long term insulin  use [facility])   [place] [facility] Dept Of [place]. [facility], [place], [place]   10 months ago Uncontrolled type 2 diabetes mellitus with hyperglycemia [facility])   [place] [facility] - A Dept Of [place]. [facility] [place], [place], [place]   1 year ago Uncontrolled type 2 diabetes mellitus with  hyperglycemia [facility])   [place] [facility] - A Dept Of [place]. [facility] Theotis Haze ORN, NP   1 year ago Chronic venous insufficiency    [facility] [place] - A Dept Of [place]. [facility] [place], Haze ORN, [place]              Passed - Patient is not pregnant      Passed - Last BP in normal range    BP Readings from Last 1 Encounters:  01/29/24 121/77            Requested Prescriptions  Pending Prescriptions Disp Refills   enalapril  (VASOTEC ) 20 MG tablet 30 tablet 0    Sig: Take 1 tablet (20 mg total) by mouth daily. Please schedule an appointment for additional refills.     Cardiovascular:  ACE Inhibitors Failed - 05/21/2024  1:24 PM      Failed - Cr in normal range and within 180 days    Creat  Date Value Ref Range Status  02/09/2015 0.55 0.50 - 1.10 mg/dL Final   Creatinine, Ser  Date Value Ref Range Status  09/19/2023 0.66 0.57 - 1.00 mg/dL  Final   Creatinine,U  Date Value Ref Range Status  04/29/2010 71.9 mg/dL Final   Creatinine, Urine  Date Value Ref Range Status  03/22/2015 27.33 >20.0 mg/dL Final    Comment:    * (PPS) Presumptive positive screen result to be verified by         quantitative LC/MS or GC/MS confirmation testing.          Failed - K in normal range and within 180 days    Potassium  Date Value Ref Range Status  09/19/2023 4.3 3.5 - 5.2 mmol/L Final         Failed - Valid encounter within last 6 months    Recent Outpatient Visits           7 months ago Other fatigue   [place] [facility] [place] [facility] Dept Of [place]. [facility], [place] M, [place]   8 months ago Type 2 diabetes mellitus with hyperglycemia, unspecified whether long term insulin  use [facility])   [place] [facility] Dept Of [place]. [facility], [place], [place]   10 months ago Uncontrolled type 2 diabetes mellitus with hyperglycemia [facility])   [place] [facility]  Dept Of [place]. [facility] [place], [place], [place]   1 year ago Uncontrolled type 2 diabetes mellitus with hyperglycemia [facility])   [place] [facility] - A Dept Of [place]. [facility] Theotis Haze ORN, NP   1 year ago Chronic venous insufficiency   [place] [facility] [place] - [facility] Dept Of [place]. [facility] [place], Haze ORN, [place]              Passed - Patient is not pregnant      Passed - Last BP in normal range    BP Readings from Last 1 Encounters:  01/29/24 121/77

## 2024-05-21 NOTE — Telephone Encounter (Signed)
 FYI Only or Action Required?: Action required by provider: medication refill request.  Patient was last seen in primary care on 10/23/2023 by Kennyth Domino, FNP. Called Nurse Triage reporting Leg Swelling. Symptoms began a week ago. Interventions attempted: OTC medications: tylenol . Symptoms are: unchanged.  Triage Disposition: See Physician Within 24 Hours  Patient/caregiver understands and will follow disposition?: No, wishes to speak with PCP   Pt states that she has been out of her BP medication and her lasix , instructed pt that BP medication was sent in today, offered appt for tomorrow, but states her boyfriend is having surgery on tomorrow. Would like a refill on her lasix      Copied from CRM [id number]. Topic: Clinical - Red Word Triage >> May 21, 2024  3:10 PM Fonda T wrote: [facility] that prompted transfer to Nurse Triage: Patient states she has been out of blood pressure medication going on 3 days now, blood pressure is now high, feet and legs are swelling and patient also states she is experiencing increased anxiety. Reason for Disposition  [1] Swelling of both ankles/feet AND [2] MILD (Exception: heat edema)  Answer Assessment - Initial Assessment Questions 1. ONSET: When did the swelling start? (e.g., minutes, hours, days)     A week 2. LOCATION: What part of the leg is swollen?  Are both legs swollen or just one leg?     Both feet 3. DEGREE OF SWELLING: How large is the swelling?  - LOCALIZED - Small area of swelling on part of one leg (estimate the size) - WIDESPREAD - Swelling involves a large part of leg (calf, thigh or whole leg) or both legs/feet     Both feet 4. SEVERITY of WIDESPREAD SWELLING (e.g., Edema): How bad is the swelling? - MILD edema - swelling limited to foot and ankle, pitting edema < 1/4 inch deep, rest and elevation eliminate most or all swelling - MODERATE edema - swelling of lower leg to knee, pitting edema > 1/4 inch deep, rest and elevation  only partially reduce swelling - SEVERE edema - swelling extends above knee, facial or hand swelling also present      mild 5. REDNESS: Does the swelling look red or infected?     red 6. PAIN: Is there any pain? If so, ask, How bad is it?     8/10  7. ITCH: Does the swelling itch? If so, ask, How much?     no 8. CAUSE: What do you think caused the swelling?      Out of lasix  and BP medication x 3 days 9. CHRONIC DISEASE: Does your child have kidney, heart or liver disease?     htn  Protocols used: Leg or Foot Swelling-P-AH

## 2024-05-21 NOTE — Telephone Encounter (Unsigned)
 Copied from CRM [id number]. Topic: Clinical - Medication Refill >> May 21, 2024 12:51 PM Yolanda T wrote: Medication: zolpidem  (AMBIEN ) 5 MG tablet and enalapril  (VASOTEC ) 20 MG tablet [Pharmacy Med Name: ENALAPRIL  MALEATE 20 MG TAB  Has the patient contacted their pharmacy? No  This is the patient's preferred pharmacy:  [id number] [place], [place] - [address] [place] [address] Phone: [phone] Fax: [phone]  Is this the correct pharmacy for this prescription? Yes  Has the prescription been filled recently? Yes  Is the patient out of the medication? Yes  Has the patient been seen for an appointment in the last year OR does the patient have an upcoming appointment? Yes  Can we respond through MyChart? No  Agent: Please be advised that Rx refills may take up to 3 business days. We ask that you follow-up with your pharmacy.

## 2024-05-21 NOTE — Telephone Encounter (Signed)
 At her last visit with vascular specialist she was diagnosed with lymphedema. Furosemide  is not a treatment for lymphedema. She was instructed to wear compression socks/stocking, elevate legs as often as possible and if no improvement she would request to be referred to lymphedema clinic. She also missed her follow up appt with vascular. Would she like a referral to the lymphedema clinic?

## 2024-05-21 NOTE — Telephone Encounter (Signed)
 Patient sent 30 day supply of medication and will be refilled at appointment in August.

## 2024-05-21 NOTE — Telephone Encounter (Signed)
 Copied from CRM [id number]. Topic: Clinical - Prescription Issue >> May 21, 2024 12:43 PM Mary Gray wrote:  Pt calling to notify dr, she has two appointments schedule and would lie to know if she can now refill her medication enalapril  (VASOTEC ) 20 MG tablet [[id number]], that was denied due to needing an appointment

## 2024-05-22 ENCOUNTER — Telehealth: Payer: Self-pay | Admitting: Nurse Practitioner

## 2024-05-22 DIAGNOSIS — F5105 Insomnia due to other mental disorder: Secondary | ICD-10-CM

## 2024-05-22 NOTE — Telephone Encounter (Signed)
 Copied from CRM [id number]. Topic: Clinical - Prescription Issue >> May 22, 2024 12:42 PM Powell HERO wrote:  Reason for CRM: zolpidem  (AMBIEN ) 5 MG tablet,  patient is wanting to pick up this med but it was not at pharmacy along with her other medication enalapril  (VASOTEC ) 20 MG tablet, which she was able to pick up. Please advise of any issues per pts request. Worried about not having any over the weekend.

## 2024-05-22 NOTE — Telephone Encounter (Signed)
 Requested medication (s) are due for refill today: Yes  Requested medication (s) are on the active medication list: Yes  Last refill:  03/17/24  Future visit scheduled: Yes  Notes to clinic:  Unable to refill per protocol, cannot delegate.      Requested Prescriptions  Pending Prescriptions Disp Refills   zolpidem  (AMBIEN ) 5 MG tablet 30 tablet 1     Not Delegated - Psychiatry:  Anxiolytics/Hypnotics Failed - 05/22/2024  4:53 PM      Failed - This refill cannot be delegated      Failed - Urine Drug Screen completed in last 360 days      Failed - Valid encounter within last 6 months    Recent Outpatient Visits           7 months ago Other fatigue   [place] [facility] [facility] Dept Of [place]. [facility], [place], [place]   8 months ago Type 2 diabetes mellitus with hyperglycemia, unspecified whether long term insulin  use [facility])   [place] [facility] - [facility] Dept Of [place]. [facility], [place], [place]   10 months ago Uncontrolled type 2 diabetes mellitus with hyperglycemia [facility])   [place] [facility] - A Dept Of [place]. [facility] [place], [place], [place]   1 year ago Uncontrolled type 2 diabetes mellitus with hyperglycemia [facility])   [place] [facility] - A Dept Of [place]. [facility] Theotis Haze ORN, NP   1 year ago Chronic venous insufficiency   [place] [facility] [place] - A Dept Of [place]. [facility] Theotis Haze ORN, [place]

## 2024-05-22 NOTE — Telephone Encounter (Signed)
 noted

## 2024-05-22 NOTE — Telephone Encounter (Signed)
 Patient identified by name and date of birth.  Patient made aware of Ms. Flemings response and stated that she does not know what lymphedema is and doesn't want a referral. I did explain with lymphedema was and patient still refused referral.  Patient stated that she will reach out to the vascular clinic to schedule another appointment.  Patient also stated that she missed her follow up appointment because she was sick with Flu and or Covid.

## 2024-05-28 ENCOUNTER — Other Ambulatory Visit: Payer: Self-pay | Admitting: Family Medicine

## 2024-05-28 DIAGNOSIS — K219 Gastro-esophageal reflux disease without esophagitis: Secondary | ICD-10-CM

## 2024-05-28 DIAGNOSIS — E785 Hyperlipidemia, unspecified: Secondary | ICD-10-CM

## 2024-06-19 ENCOUNTER — Telehealth: Payer: Self-pay | Admitting: Nurse Practitioner

## 2024-06-19 ENCOUNTER — Other Ambulatory Visit: Payer: Self-pay | Admitting: Family Medicine

## 2024-06-19 DIAGNOSIS — I10 Essential (primary) hypertension: Secondary | ICD-10-CM

## 2024-06-19 NOTE — Telephone Encounter (Signed)
 Called patient, no answer. Left voicemail confirming upcoming appointment on 06/20/2024 at 3:10 pm. Provided callback number for any questions or changes.

## 2024-06-20 ENCOUNTER — Encounter: Admitting: Nurse Practitioner

## 2024-06-20 ENCOUNTER — Telehealth: Payer: Self-pay | Admitting: Nurse Practitioner

## 2024-06-20 ENCOUNTER — Other Ambulatory Visit: Payer: Self-pay | Admitting: Family Medicine

## 2024-06-20 DIAGNOSIS — I10 Essential (primary) hypertension: Secondary | ICD-10-CM

## 2024-06-20 NOTE — Telephone Encounter (Signed)
 Copied from CRM [id number]. Topic: Clinical - Medication Question >> Jun 19, 2024  4:43 PM Leonette P wrote:  Reason for CRM: t needs a refill for her heart medication Enalapril  20 mg.  Her partner is going through cancer treatment and she can't come to the appt tomorrow. .  She is completely out.  >> Jun 19, 2024  4:56 PM Delon T wrote: Appointment is scheduled for Sep 3- need meds refilled as she is completely out.

## 2024-06-22 ENCOUNTER — Other Ambulatory Visit: Payer: Self-pay | Admitting: Nurse Practitioner

## 2024-06-22 DIAGNOSIS — I10 Essential (primary) hypertension: Secondary | ICD-10-CM

## 2024-06-22 MED ORDER — ENALAPRIL MALEATE 20 MG PO TABS: 20.0000 mg | ORAL_TABLET | Freq: Every day | ORAL | 0 refills | Status: DC

## 2024-06-22 NOTE — Telephone Encounter (Signed)
Enalapril sent.

## 2024-06-23 NOTE — Telephone Encounter (Signed)
 Unable to reach patient by phone. Voicemail left informing patient of mediation being filled.

## 2024-07-01 ENCOUNTER — Other Ambulatory Visit: Payer: Self-pay | Admitting: Family Medicine

## 2024-07-01 DIAGNOSIS — E785 Hyperlipidemia, unspecified: Secondary | ICD-10-CM

## 2024-07-01 DIAGNOSIS — K219 Gastro-esophageal reflux disease without esophagitis: Secondary | ICD-10-CM

## 2024-07-05 ENCOUNTER — Other Ambulatory Visit: Payer: Self-pay | Admitting: Family Medicine

## 2024-07-05 DIAGNOSIS — K219 Gastro-esophageal reflux disease without esophagitis: Secondary | ICD-10-CM

## 2024-07-05 DIAGNOSIS — E785 Hyperlipidemia, unspecified: Secondary | ICD-10-CM

## 2024-07-20 ENCOUNTER — Other Ambulatory Visit: Payer: Self-pay | Admitting: Family Medicine

## 2024-07-22 ENCOUNTER — Telehealth: Payer: Self-pay | Admitting: Nurse Practitioner

## 2024-07-22 NOTE — Telephone Encounter (Signed)
 pt unconfirmed appt 9/2 lvm

## 2024-07-23 ENCOUNTER — Other Ambulatory Visit: Payer: Self-pay | Admitting: Physician Assistant

## 2024-07-23 ENCOUNTER — Ambulatory Visit: Attending: Nurse Practitioner | Admitting: Nurse Practitioner

## 2024-07-23 ENCOUNTER — Encounter: Payer: Self-pay | Admitting: Nurse Practitioner

## 2024-07-23 ENCOUNTER — Other Ambulatory Visit: Payer: Self-pay | Admitting: Nurse Practitioner

## 2024-07-23 VITALS — BP 145/82 | HR 60 | Resp 19 | Ht 61.0 in | Wt 190.8 lb

## 2024-07-23 DIAGNOSIS — J40 Bronchitis, not specified as acute or chronic: Secondary | ICD-10-CM

## 2024-07-23 DIAGNOSIS — Z7984 Long term (current) use of oral hypoglycemic drugs: Secondary | ICD-10-CM | POA: Diagnosis not present

## 2024-07-23 DIAGNOSIS — E034 Atrophy of thyroid (acquired): Secondary | ICD-10-CM

## 2024-07-23 DIAGNOSIS — K219 Gastro-esophageal reflux disease without esophagitis: Secondary | ICD-10-CM | POA: Diagnosis not present

## 2024-07-23 DIAGNOSIS — Z1211 Encounter for screening for malignant neoplasm of colon: Secondary | ICD-10-CM

## 2024-07-23 DIAGNOSIS — Z23 Encounter for immunization: Secondary | ICD-10-CM

## 2024-07-23 DIAGNOSIS — G2581 Restless legs syndrome: Secondary | ICD-10-CM

## 2024-07-23 DIAGNOSIS — M47816 Spondylosis without myelopathy or radiculopathy, lumbar region: Secondary | ICD-10-CM

## 2024-07-23 DIAGNOSIS — K5909 Other constipation: Secondary | ICD-10-CM

## 2024-07-23 DIAGNOSIS — F17200 Nicotine dependence, unspecified, uncomplicated: Secondary | ICD-10-CM

## 2024-07-23 DIAGNOSIS — B379 Candidiasis, unspecified: Secondary | ICD-10-CM

## 2024-07-23 DIAGNOSIS — E1140 Type 2 diabetes mellitus with diabetic neuropathy, unspecified: Secondary | ICD-10-CM

## 2024-07-23 DIAGNOSIS — E1165 Type 2 diabetes mellitus with hyperglycemia: Secondary | ICD-10-CM

## 2024-07-23 DIAGNOSIS — I10 Essential (primary) hypertension: Secondary | ICD-10-CM

## 2024-07-23 DIAGNOSIS — E119 Type 2 diabetes mellitus without complications: Secondary | ICD-10-CM | POA: Diagnosis not present

## 2024-07-23 DIAGNOSIS — M51362 Other intervertebral disc degeneration, lumbar region with discogenic back pain and lower extremity pain: Secondary | ICD-10-CM

## 2024-07-23 DIAGNOSIS — M51369 Other intervertebral disc degeneration, lumbar region without mention of lumbar back pain or lower extremity pain: Secondary | ICD-10-CM

## 2024-07-23 DIAGNOSIS — Z1231 Encounter for screening mammogram for malignant neoplasm of breast: Secondary | ICD-10-CM

## 2024-07-23 DIAGNOSIS — J441 Chronic obstructive pulmonary disease with (acute) exacerbation: Secondary | ICD-10-CM

## 2024-07-23 DIAGNOSIS — F17210 Nicotine dependence, cigarettes, uncomplicated: Secondary | ICD-10-CM

## 2024-07-23 DIAGNOSIS — E785 Hyperlipidemia, unspecified: Secondary | ICD-10-CM

## 2024-07-23 DIAGNOSIS — G894 Chronic pain syndrome: Secondary | ICD-10-CM

## 2024-07-23 LAB — POCT GLYCOSYLATED HEMOGLOBIN (HGB A1C): Hemoglobin A1C: 7.3 % — AB (ref 4.0–5.6)

## 2024-07-23 MED ORDER — NYSTATIN 100000 UNIT/GM EX POWD
1.0000 | Freq: Three times a day (TID) | CUTANEOUS | 3 refills | Status: AC
Start: 2024-07-23 — End: ?

## 2024-07-23 MED ORDER — OZEMPIC (0.25 OR 0.5 MG/DOSE) 2 MG/3ML ~~LOC~~ SOPN: 0.2500 mg | PEN_INJECTOR | SUBCUTANEOUS | 2 refills | Status: DC

## 2024-07-23 MED ORDER — ENALAPRIL MALEATE 20 MG PO TABS: 20.0000 mg | ORAL_TABLET | Freq: Every day | ORAL | 1 refills | Status: AC

## 2024-07-23 MED ORDER — ESOMEPRAZOLE MAGNESIUM 40 MG PO CPDR: 40.0000 mg | DELAYED_RELEASE_CAPSULE | Freq: Every day | ORAL | 3 refills | Status: AC

## 2024-07-23 MED ORDER — CYCLOBENZAPRINE HCL 5 MG PO TABS: 5.0000 mg | ORAL_TABLET | Freq: Three times a day (TID) | ORAL | 0 refills | Status: DC | PRN

## 2024-07-23 MED ORDER — NALOXEGOL OXALATE 25 MG PO TABS: 25.0000 mg | ORAL_TABLET | Freq: Every day | ORAL | 1 refills | Status: AC

## 2024-07-23 MED ORDER — FLUTICASONE-SALMETEROL 250-50 MCG/ACT IN AEPB
1.0000 | INHALATION_SPRAY | Freq: Two times a day (BID) | RESPIRATORY_TRACT | 6 refills | Status: AC
Start: 2024-07-23 — End: ?

## 2024-07-23 MED ORDER — ROSUVASTATIN CALCIUM 20 MG PO TABS: 20.0000 mg | ORAL_TABLET | Freq: Every day | ORAL | 1 refills | Status: AC

## 2024-07-23 MED ORDER — ONDANSETRON 4 MG PO TBDP: 4.0000 mg | ORAL_TABLET | Freq: Three times a day (TID) | ORAL | 6 refills | Status: AC | PRN

## 2024-07-23 MED ORDER — GABAPENTIN 600 MG PO TABS: 600.0000 mg | ORAL_TABLET | Freq: Three times a day (TID) | ORAL | 1 refills | Status: AC

## 2024-07-23 NOTE — Telephone Encounter (Signed)
 Copied from CRM [id number]. Topic: Clinical - Medication Refill >> Jul 23, 2024  3:46 PM Myrick T wrote: Patient said she spoke with provider about filing her script for the medication  Medication:  traMADol  (ULTRAM ) 50 MG tablet   Has the patient contacted their pharmacy? Yes Pharmacy called stated she needed to contact provider  This is the patient's preferred pharmacy:  [id number] [place], [place] - [address] [place] [address] Phone: [phone] Fax: [phone]  Is this the correct pharmacy for this prescription? Yes  Has the prescription been filled recently? No  Is the patient out of the medication? Yes  Has the patient been seen for an appointment in the last year OR does the patient have an upcoming appointment? Yes  Can we respond through MyChart? Yes  Agent: Please be advised that Rx refills may take up to 3 business days. We ask that you follow-up with your pharmacy.

## 2024-07-23 NOTE — Progress Notes (Unsigned)
 Assessment & Plan:  Mary Gray was seen today for diabetes.  Diagnoses and all orders for this visit:  Uncontrolled type 2 diabetes mellitus with hyperglycemia (HCC) -     Ambulatory referral to Ophthalmology -     POCT glycosylated hemoglobin (Hb A1C) -     Semaglutide ,0.25 or 0.5MG /DOS, (OZEMPIC , 0.25 OR 0.5 MG/DOSE,) 2 MG/3ML SOPN; Inject 0.25 mg into the skin once a week.  Essential hypertension -     enalapril  (VASOTEC ) 20 MG tablet; Take 1 tablet (20 mg total) by mouth daily.  Gastroesophageal reflux disease, unspecified whether esophagitis present -     esomeprazole  (NEXIUM ) 40 MG capsule; Take 1 capsule (40 mg total) by mouth daily.  Dyslipidemia, goal LDL below 70 -     rosuvastatin  (CRESTOR ) 20 MG tablet; Take 1 tablet (20 mg total) by mouth daily.  Need for vaccination against Streptococcus pneumoniae -     Pneumococcal conjugate vaccine 20-valent (PCV20)  Controlled type 2 diabetes with neuropathy (HCC) -     Urine Albumin/Creatinine with ratio (send out) [LAB689]  Breast cancer screening by mammogram -     MS 3D SCR MAMMO BILAT BR (aka MM); Future  Colon cancer screening -     Ambulatory referral to Gastroenterology  COPD exacerbation (HCC) -     CT CHEST LUNG CA SCREEN LOW DOSE W/O CM; Future -     fluticasone -salmeterol (WIXELA INHUB) 250-50 MCG/ACT AEPB; Inhale 1 puff into the lungs 2 (two) times daily. in the morning and at bedtime. -     Ambulatory referral to Pulmonology  Chronic constipation -     naloxegol  oxalate (MOVANTIK ) 25 MG TABS tablet; Take 1 tablet (25 mg total) by mouth daily.  Lumbar degenerative disc disease -     cyclobenzaprine  (FLEXERIL ) 5 MG tablet; Take 1-2 tablets (5-10 mg total) by mouth 3 (three) times daily as needed for muscle spasms.  Restless leg syndrome -     cyclobenzaprine  (FLEXERIL ) 5 MG tablet; Take 1-2 tablets (5-10 mg total) by mouth 3 (three) times daily as needed for muscle spasms.  Lumbar spondylosis -      cyclobenzaprine  (FLEXERIL ) 5 MG tablet; Take 1-2 tablets (5-10 mg total) by mouth 3 (three) times daily as needed for muscle spasms.  Chronic pain syndrome -     cyclobenzaprine  (FLEXERIL ) 5 MG tablet; Take 1-2 tablets (5-10 mg total) by mouth 3 (three) times daily as needed for muscle spasms. -     Ambulatory referral to Pain Clinic  Hypothyroidism due to acquired atrophy of thyroid  -     Thyroid  Panel With TSH  Bronchitis -     fluticasone -salmeterol (WIXELA INHUB) 250-50 MCG/ACT AEPB; Inhale 1 puff into the lungs 2 (two) times daily. in the morning and at bedtime.  Other orders -     gabapentin  (NEURONTIN ) 600 MG tablet; Take 1 tablet (600 mg total) by mouth 3 (three) times daily. -     nystatin  powder; Apply 1 Application topically 3 (three) times daily. -     ondansetron  (ZOFRAN -ODT) 4 MG disintegrating tablet; Take 1 tablet (4 mg total) by mouth every 8 (eight) hours as needed for nausea or vomiting.    Patient has been counseled on age-appropriate routine health concerns for screening and prevention. These are reviewed and up-to-date. Referrals have been placed accordingly. Immunizations are up-to-date or declined.    Subjective:   Chief Complaint  Patient presents with   Diabetes    Mary Gray 63 y.o. female  presents to office today    HPI  ROS  Past Medical History:  Diagnosis Date   Asthma    Back pain    Chronic interstitial cystitis    Followed by Dr. Garnette Shack   COPD (chronic obstructive pulmonary disease) (HCC)    DDD (degenerative disc disease), lumbar    Degeneration of cervical intervertebral disc    Depression    Diabetes mellitus, type 2 (HCC) 2007   Heme positive stool    EGD on 04/08/2010 by Dr. Rosalie showed chronic gastritis and a few gastric polyps; pathology showed a fundic gland polyp.  Colonoscopy on 04/08/2010 showed small external and internal hemorrhoids, and a few benign-appearing diminutive polyps in the rectum, the distal  sigmoid colon, and in the distal descending colon; pathology showed hyperplastic polyps.    Hypertension    Hypothyroidism    Iron  deficiency anemia    Peripheral arterial disease (HCC)    Spinal stenosis     Past Surgical History:  Procedure Laterality Date   CARPAL TUNNEL RELEASE  05/08/2000   By Dr. Lamar GAILS. Sypher, Mickey.    Family History  Problem Relation Age of Onset   Heart attack Father 69   Diabetes Mother    Hypertension Mother    Diabetes Sister    Breast cancer Neg Hx    Colon cancer Neg Hx     Social History Reviewed with no changes to be made today.   Outpatient Medications Prior to Visit  Medication Sig Dispense Refill   albuterol  (PROVENTIL ) (2.5 MG/3ML) 0.083% nebulizer solution USE 1 VIAL IN NEBULIZER 4 TIMES DAILY - As Needed 3 mL 11   albuterol  (VENTOLIN  HFA) 108 (90 Base) MCG/ACT inhaler Inhale 2 puffs into the lungs every 4 (four) hours as needed for wheezing or shortness of breath. 17 each 3   amLODipine  (NORVASC ) 2.5 MG tablet Take 1 tablet (2.5 mg total) by mouth daily. 90 tablet 3   Iron , Ferrous Sulfate , 325 (65 Fe) MG TABS Take 325 mg by mouth daily. 90 tablet 5   levocetirizine (XYZAL ) 5 MG tablet Take 1 tablet (5 mg total) by mouth every evening. 30 tablet 5   levothyroxine  (SYNTHROID ) 88 MCG tablet Take 1 tablet (88 mcg total) by mouth daily before breakfast. 90 tablet 2   Misc. Devices MISC Provide patient with shower chair and grab bar. ICD10 M17.10 M54.16 1 each 0   multivitamin-iron -minerals-folic acid  (CENTRUM) chewable tablet Chew 1 tablet by mouth daily. 90 tablet 3   PREMARIN  vaginal cream PLACE 1 APPLICATORFUL VAGINALLY DAILY. 30 g 1   rOPINIRole  (REQUIP ) 2 MG tablet TAKE 1 TABLET BY MOUTH EVERYDAY AT BEDTIME 90 tablet 1   zolpidem  (AMBIEN ) 5 MG tablet TAKE 1 TABLET BY MOUTH EVERYDAY AT BEDTIME 30 tablet 1   Accu-Chek Softclix Lancets lancets Check blood sugar 1 time per day (Patient not taking: Reported on 07/23/2024) 100 each 3    acetaminophen  (TYLENOL ) 500 MG tablet Take 1,000 mg by mouth every 6 (six) hours as needed for mild pain. (Patient not taking: Reported on 07/23/2024)     amitriptyline  (ELAVIL ) 25 MG tablet Take 1 tablet (25 mg total) by mouth at bedtime. Must keep upcoming office visit for refills (Patient not taking: Reported on 07/23/2024) 30 tablet 0   aspirin  EC 81 MG tablet Take 81 mg by mouth daily. Swallow whole. (Patient not taking: Reported on 07/23/2024)     benzonatate  (TESSALON ) 100 MG capsule Take 1 capsule (100 mg total) by  mouth 3 (three) times daily as needed for cough. (Patient not taking: Reported on 07/23/2024) 21 capsule 0   Blood Glucose Monitoring Suppl (ACCU-CHEK GUIDE) w/Device KIT Use to check blood sugar 1 times daily. (Patient not taking: Reported on 07/23/2024) 1 kit 0   [facility] VITAMIN B12 1000 MCG tablet TAKE 1 TABLET BY MOUTH EVERY DAY (Patient not taking: Reported on 07/23/2024) 90 tablet 3   glucose blood (ACCU-CHEK GUIDE TEST) test strip Use to check blood sugar 1 times daily. (Patient not taking: Reported on 07/23/2024) 100 each 6   ipratropium-albuterol  (DUONEB) 0.5-2.5 (3) MG/3ML SOLN Take 3 mLs by nebulization every 4 (four) hours as needed. 60 each 0   meloxicam  (MOBIC ) 7.5 MG tablet TAKE 1 TABLET BY MOUTH EVERY DAY *NEED APPOINTMENT FOR REFILLS 90 tablet 0   metFORMIN  (GLUCOPHAGE -XR) 500 MG 24 hr tablet Take 2 tablets (1,000 mg total) by mouth 2 (two) times daily. (Patient not taking: Reported on 07/23/2024) 360 tablet 0   Tiotropium Bromide Monohydrate  (SPIRIVA  RESPIMAT) 2.5 MCG/ACT AERS Inhale 2 puffs into the lungs daily. (Patient not taking: Reported on 07/23/2024) 4 g 0   traMADol  (ULTRAM ) 50 MG tablet Take 50 mg by mouth every 6 (six) hours as needed for moderate pain (pain score 4-6). (Patient not taking: Reported on 07/23/2024)     triamcinolone  (KENALOG ) 0.025 % ointment APPLY TO AFFECTED AREA TWICE A DAY (Patient not taking: Reported on 07/23/2024) 30 g 0   cyclobenzaprine  (FLEXERIL ) 5 MG  tablet TAKE 1 TABLET BY MOUTH EVERYDAY AT BEDTIME (Patient not taking: Reported on 07/23/2024) 30 tablet 0   enalapril  (VASOTEC ) 20 MG tablet Take 1 tablet (20 mg total) by mouth daily. Please schedule an appointment for additional refills. 30 tablet 0   esomeprazole  (NEXIUM ) 40 MG capsule TAKE 1 CAPSULE (40 MG TOTAL) BY MOUTH DAILY. MUST HAVE OFFICE VISIT FOR REFILLS 30 capsule 0   fluticasone -salmeterol (WIXELA INHUB) 250-50 MCG/ACT AEPB INHALE 1 PUFF INTO THE LUNGS IN THE MORNING AND AT BEDTIME. (Patient not taking: Reported on 07/23/2024) 180 each 2   gabapentin  (NEURONTIN ) 600 MG tablet TAKE 1 TABLET BY MOUTH THREE TIMES A DAY 270 tablet 0   MOVANTIK  25 MG TABS tablet TAKE 1 TABLET (25 MG TOTAL) BY MOUTH DAILY. (Patient not taking: Reported on 07/23/2024) 90 tablet 0   nystatin  (NYSTATIN ) powder Apply 1 application topically 3 (three) times daily. 15 g 0   nystatin  cream (MYCOSTATIN ) Apply 1 Application topically 2 (two) times daily. (Patient not taking: Reported on 07/23/2024) 60 g 3   ondansetron  (ZOFRAN -ODT) 4 MG disintegrating tablet Take 1 tablet (4 mg total) by mouth every 8 (eight) hours as needed for nausea or vomiting. (Patient not taking: Reported on 07/23/2024) 10 tablet 0   rosuvastatin  (CRESTOR ) 20 MG tablet TAKE 1 TABLET (20 MG TOTAL) BY MOUTH DAILY. PLEASE SCHEDULE PCP APPOINTMENT FOR ADDITIONAL REFILLS. 30 tablet 0   Semaglutide ,0.25 or 0.5MG /DOS, (OZEMPIC , 0.25 OR 0.5 MG/DOSE,) 2 MG/3ML SOPN Inject 0.25 mg into the skin once a week. Please make appt with Amulya Quintin for more refills. Last two visits have been with a covering provider. 3 mL 0   No facility-administered medications prior to visit.    Allergies  Allergen Reactions   Amoxicillin Other (See Comments)   Codeine  Sulfate Other (See Comments)    Confusion, anxiety, fatigue   Meperidine And Related Nausea And Vomiting   Minocycline Hcl Nausea And Vomiting    Hospitalized for vomiting along with allergic reaction  Penicillins Other (See Comments)    Has patient had a PCN reaction causing immediate rash, facial/tongue/throat swelling, SOB or lightheadedness with hypotension: Yes Has patient had a PCN reaction causing severe rash involving mucus membranes or skin necrosis: Yes Has patient had a PCN reaction that required hospitalization Yes Has patient had a PCN reaction occurring within the last 10 years: Yes If all of the above answers are NO, then may proceed with Cephalosporin use.    Propoxyphene N-Acetaminophen  Other (See Comments)    confused   Tetracycline Hcl Other (See Comments)    Vomiting Note: Patient took a course of doxycycline  in June 2014 without any problems.       Objective:    BP (!) 145/82 (BP Location: Right Arm, Patient Position: Sitting, Cuff Size: Normal)   Pulse 60   Resp 19   Ht 5' 1 (1.549 m)   Wt 190 lb 12.8 oz (86.5 kg)   LMP 01/17/2009   SpO2 99%   BMI 36.05 kg/m  Wt Readings from Last 3 Encounters:  07/23/24 190 lb 12.8 oz (86.5 kg)  09/27/23 193 lb 3.2 oz (87.6 kg)  09/19/23 204 lb 9.6 oz (92.8 kg)    Physical Exam       Patient has been counseled extensively about nutrition and exercise as well as the importance of adherence with medications and regular follow-up. The patient was given clear instructions to go to ER or return to medical center if symptoms don't improve, worsen or new problems develop. The patient verbalized understanding.   Follow-up: No follow-ups on file.   Haze LELON Servant, FNP-BC [facility] [place], [place] [phone]   07/23/2024, 11:06 AM

## 2024-07-24 MED ORDER — METFORMIN HCL ER 500 MG PO TB24
1000.0000 mg | ORAL_TABLET | Freq: Two times a day (BID) | ORAL | 0 refills | Status: AC
Start: 2024-07-24 — End: ?

## 2024-07-24 NOTE — Telephone Encounter (Signed)
 Requested medication (s) are due for refill today: routing for review  Requested medication (s) are on the active medication list: yes  Last refill:  06/19/23  Future visit scheduled:   Notes to clinic:  historical mediation, not on current list     Requested Prescriptions  Pending Prescriptions Disp Refills   traMADol  (ULTRAM ) 50 MG tablet 30 tablet     Sig: Take 1 tablet (50 mg total) by mouth every 6 (six) hours as needed for moderate pain (pain score 4-6).     Not Delegated - Analgesics:  Opioid Agonists Failed - 07/24/2024 12:24 PM      Failed - This refill cannot be delegated      Failed - Urine Drug Screen completed in last 360 days      Passed - Valid encounter within last 3 months    Recent Outpatient Visits           Yesterday Primary hypertension   [facility] Dept Of [place]. [facility] Theotis Haze ORN, NP   10 months ago Other fatigue   [facility] Dept Of [place]. [facility], [place], [place]   10 months ago Type 2 diabetes mellitus with hyperglycemia, unspecified whether long term insulin  use [facility])   [facility] - [facility] Dept Of [place]. [facility] [place], [place], [place]   1 year ago Uncontrolled type 2 diabetes mellitus with hyperglycemia [facility])   [facility] - A Dept Of [place]. [facility], [place], [place]   1 year ago Uncontrolled type 2 diabetes mellitus with hyperglycemia [facility])   [facility] - A Dept Of [place]. [facility] Theotis Haze ORN, [place]

## 2024-07-25 ENCOUNTER — Telehealth: Payer: Self-pay

## 2024-07-25 ENCOUNTER — Encounter: Payer: Self-pay | Admitting: Nurse Practitioner

## 2024-07-25 ENCOUNTER — Ambulatory Visit: Payer: Self-pay | Admitting: Nurse Practitioner

## 2024-07-25 ENCOUNTER — Other Ambulatory Visit: Payer: Self-pay | Admitting: Nurse Practitioner

## 2024-07-25 DIAGNOSIS — F418 Other specified anxiety disorders: Secondary | ICD-10-CM

## 2024-07-25 LAB — THYROID PANEL WITH TSH
Free Thyroxine Index: 2.1 (ref 1.2–4.9)
T3 Uptake Ratio: 22 % — ABNORMAL LOW (ref 24–39)
T4, Total: 9.6 ug/dL (ref 4.5–12.0)
TSH: 2.79 u[IU]/mL (ref 0.450–4.500)

## 2024-07-25 LAB — MICROALBUMIN / CREATININE URINE RATIO
Creatinine, Urine: 108.3 mg/dL
Microalb/Creat Ratio: 34 mg/g{creat} — AB (ref 0–29)
Microalbumin, Urine: 37 ug/mL

## 2024-07-25 MED ORDER — AMLODIPINE BESYLATE 2.5 MG PO TABS: 2.5000 mg | ORAL_TABLET | Freq: Every day | ORAL | 3 refills | Status: AC

## 2024-07-25 NOTE — Telephone Encounter (Signed)
 Copied from CRM [id number]. Topic: Clinical - Medication Refill >> Jul 25, 2024  8:43 AM Ivette P wrote: Medication:  zolpidem  (AMBIEN ) 5 MG tablet  Has the patient contacted their pharmacy? Yes (Agent: If no, request that the patient contact the pharmacy for the refill. If patient does not wish to contact the pharmacy document the reason why and proceed with request.) (Agent: If yes, when and what did the pharmacy advise?)  This is the patient's preferred pharmacy:  [id number] [place], [place] - [address] [place] [address] Phone: [phone] Fax: [phone]  [facility] - [place], [place] - [address] [place] [address] Phone: [phone] Fax: [phone]  [facility] - [place], [place] - [address] [employer]. [address] [place] [address] Phone: [phone] Fax: [phone]  Is this the correct pharmacy for this prescription? Yes If no, delete pharmacy and type the correct one.   Has the prescription been filled recently? No  Is the patient out of the medication? Yes, pt has 1 pill left   Has the patient been seen for an appointment in the last year OR does the patient have an upcoming appointment? Yes  Can we respond through MyChart? No, trouble signing in   Agent: Please be advised that Rx refills may take up to 3 business days. We ask that you follow-up with your pharmacy.

## 2024-07-25 NOTE — Telephone Encounter (Signed)
 Copied from CRM [id number]. Topic: Clinical - Medication Refill >> Jul 25, 2024  8:40 AM Mary Gray wrote: Medication: zolpidem  (AMBIEN ) 5 MG tablet  Has the patient contacted their pharmacy? Yes (Agent: If no, request that the patient contact the pharmacy for the refill. If patient does not wish to contact the pharmacy document the reason why and proceed with request.) (Agent: If yes, when and what did the pharmacy advise?)  This is the patient's preferred pharmacy:  [id number] [place], [place] - [address] [place] [address] Phone: [phone] Fax: [phone]   Is this the correct pharmacy for this prescription? Yes If no, delete pharmacy and type the correct one.   Has the prescription been filled recently? No  Is the patient out of the medication? Yes, pt has 1 pill left.   Has the patient been seen for an appointment in the last year OR does the patient have an upcoming appointment? Yes  Can we respond through MyChart? No, having trouble signing in  Agent: Please be advised that Rx refills may take up to 3 business days. We ask that you follow-up with your pharmacy.

## 2024-07-25 NOTE — Telephone Encounter (Signed)
 Noted

## 2024-07-25 NOTE — Telephone Encounter (Signed)
 Requested medication (s) are due for refill today: yes  Requested medication (s) are on the active medication list: yes  Last refill:  05/22/24 #30 1 RF  Future visit scheduled: no  Notes to clinic:  med not delegated to NT to RF   Requested Prescriptions  Pending Prescriptions Disp Refills   zolpidem  (AMBIEN ) 5 MG tablet 30 tablet 1     Not Delegated - Psychiatry:  Anxiolytics/Hypnotics Failed - 07/25/2024  1:11 PM      Failed - This refill cannot be delegated      Failed - Urine Drug Screen completed in last 360 days      Passed - Valid encounter within last 6 months    Recent Outpatient Visits           2 days ago Primary hypertension   [facility] [place] [facility] [place]. [facility] Theotis Haze ORN, NP   10 months ago Other fatigue   [facility] - [facility] Dept Of [place]. [facility], [place], [place]   10 months ago Type 2 diabetes mellitus with hyperglycemia, unspecified whether long term insulin  use [facility])   [facility] - [facility] Dept Of [place]. [facility] [place], [place], [place]   1 year ago Uncontrolled type 2 diabetes mellitus with hyperglycemia [facility])   [facility] - A Dept Of [place]. [facility] [place], [place], [place]   1 year ago Uncontrolled type 2 diabetes mellitus with hyperglycemia [facility])   [facility] - A Dept Of . [facility] Theotis Haze ORN, [place]

## 2024-08-08 ENCOUNTER — Other Ambulatory Visit: Payer: Self-pay | Admitting: Nurse Practitioner

## 2024-08-08 ENCOUNTER — Telehealth: Payer: Self-pay

## 2024-08-08 DIAGNOSIS — K219 Gastro-esophageal reflux disease without esophagitis: Secondary | ICD-10-CM

## 2024-08-08 NOTE — Telephone Encounter (Signed)
 Copied from CRM [id number]. Topic: Clinical - Medication Question >> Aug 08, 2024  1:12 PM Mary Gray wrote: Reason for CRM: patient called stated when she came in on 9/3 she was advised the script for  traMADol  (ULTRAM ) 50 MG tablet [id number] [place], [place] - [address]  Phone: [phone] Fax: [phone]  Please f/u with patient

## 2024-08-08 NOTE — Telephone Encounter (Signed)
 Requested medications are due for refill today.  no  Requested medications are on the active medications list.  yes  Last refill. 07/23/2024 #24 6 rf  Future visit scheduled.   A wellness visit is scheduled  Notes to clinic.  Refill/refusal not delegated.    Requested Prescriptions  Pending Prescriptions Disp Refills   ondansetron  (ZOFRAN -ODT) 4 MG disintegrating tablet 24 tablet 6    Sig: Take 1 tablet (4 mg total) by mouth every 8 (eight) hours as needed for nausea or vomiting.     Not Delegated - Gastroenterology: Antiemetics - ondansetron  Failed - 08/08/2024  5:28 PM      Failed - This refill cannot be delegated      Passed - AST in normal range and within 360 days    AST  Date Value Ref Range Status  09/19/2023 37 0 - 40 IU/L Final         Passed - ALT in normal range and within 360 days    ALT  Date Value Ref Range Status  09/19/2023 26 0 - 32 IU/L Final         Passed - Valid encounter within last 6 months    Recent Outpatient Visits           2 weeks ago Primary hypertension   [facility] [place] [facility] Dept Of [place]. [facility] Theotis Haze ORN, NP   10 months ago Other fatigue   [facility] [place] - [facility] Dept Of [place]. [facility], [place], [place]   10 months ago Type 2 diabetes mellitus with hyperglycemia, unspecified whether long term insulin  use [facility])   [facility] - A Dept Of [place]. [facility] [place], [place], [place]   1 year ago Uncontrolled type 2 diabetes mellitus with hyperglycemia [facility])   [facility] - A Dept Of [place]. [facility] [place], [place], [place]   1 year ago Uncontrolled type 2 diabetes mellitus with hyperglycemia [facility])   [place] [facility] - A Dept Of [place]. [facility] Theotis Haze ORN, [place]

## 2024-08-08 NOTE — Telephone Encounter (Signed)
 Copied from CRM [id number]. Topic: Clinical - Medication Refill >> Aug 08, 2024  1:13 PM Yolanda T wrote: Medication: ondansetron  (ZOFRAN -ODT) 4 MG disintegrating tablet  Has the patient contacted their pharmacy? No  This is the patient's preferred pharmacy:  [id number] [place], [place] - [address] [place] [address] Phone: [phone] Fax: [phone]  Is this the correct pharmacy for this prescription? Yes  Has the prescription been filled recently? Yes  Is the patient out of the medication? Yes  Has the patient been seen for an appointment in the last year OR does the patient have an upcoming appointment? Yes  Can we respond through MyChart? Yes  Agent: Please be advised that Rx refills may take up to 3 business days. We ask that you follow-up with your pharmacy.

## 2024-08-08 NOTE — Telephone Encounter (Signed)
**Note De-identified  [place] Obfuscation** Please advise 

## 2024-08-14 ENCOUNTER — Other Ambulatory Visit: Payer: Self-pay | Admitting: Family Medicine

## 2024-08-18 ENCOUNTER — Other Ambulatory Visit: Payer: Self-pay | Admitting: Nurse Practitioner

## 2024-08-18 ENCOUNTER — Other Ambulatory Visit: Payer: Self-pay | Admitting: Family Medicine

## 2024-08-18 ENCOUNTER — Other Ambulatory Visit: Payer: Self-pay | Admitting: Physician Assistant

## 2024-08-18 ENCOUNTER — Other Ambulatory Visit: Payer: Self-pay | Admitting: Podiatry

## 2024-08-18 DIAGNOSIS — M51369 Other intervertebral disc degeneration, lumbar region without mention of lumbar back pain or lower extremity pain: Secondary | ICD-10-CM

## 2024-08-18 DIAGNOSIS — G894 Chronic pain syndrome: Secondary | ICD-10-CM

## 2024-08-18 DIAGNOSIS — H66001 Acute suppurative otitis media without spontaneous rupture of ear drum, right ear: Secondary | ICD-10-CM

## 2024-08-18 DIAGNOSIS — E119 Type 2 diabetes mellitus without complications: Secondary | ICD-10-CM

## 2024-08-18 DIAGNOSIS — M51362 Other intervertebral disc degeneration, lumbar region with discogenic back pain and lower extremity pain: Secondary | ICD-10-CM

## 2024-08-22 ENCOUNTER — Ambulatory Visit (HOSPITAL_COMMUNITY): Admission: EM | Admit: 2024-08-22 | Discharge: 2024-08-22 | Disposition: A | Discharge status: Home / Self Care

## 2024-08-22 ENCOUNTER — Encounter (HOSPITAL_COMMUNITY): Payer: Self-pay | Admitting: *Deleted

## 2024-08-22 DIAGNOSIS — L720 Epidermal cyst: Secondary | ICD-10-CM | POA: Diagnosis not present

## 2024-08-22 MED ORDER — KETOROLAC TROMETHAMINE 30 MG/ML IJ SOLN
INTRAMUSCULAR | Status: AC
  Filled 2024-08-22: qty 1

## 2024-08-22 MED ORDER — KETOROLAC TROMETHAMINE 30 MG/ML IJ SOLN
30.0000 mg | Freq: Once | INTRAMUSCULAR | Status: AC
  Administered 2024-08-22: 30 mg via INTRAMUSCULAR

## 2024-08-22 MED ORDER — SULFAMETHOXAZOLE-TRIMETHOPRIM 800-160 MG PO TABS: 1.0000 | ORAL_TABLET | Freq: Two times a day (BID) | ORAL | 0 refills | Status: AC

## 2024-08-22 NOTE — ED Provider Notes (Signed)
 [facility]    CSN: [id number] Arrival date & time: 08/22/24  1405      History   Chief Complaint Chief Complaint  Patient presents with   Mass    HPI Mary Gray is a 63 y.o. female.   Presents today due to a lump of her scalp for the past 3 months that has recently become tender.  Patient states that she can feel the pain shooting down her neck and she can feel it when she turns her head.  Patient states that she has been using Tylenol  without relief of pain.  Patient denies that the area has become larger over the past 3 months.     Past Medical History:  Diagnosis Date   Asthma    Back pain    Chronic interstitial cystitis    Followed by Dr. Garnette Shack   COPD (chronic obstructive pulmonary disease) (HCC)    DDD (degenerative disc disease), lumbar    Degeneration of cervical intervertebral disc    Depression    Diabetes mellitus, type 2 (HCC) 2007   Heme positive stool    EGD on 04/08/2010 by Dr. Rosalie showed chronic gastritis and a few gastric polyps; pathology showed a fundic gland polyp.  Colonoscopy on 04/08/2010 showed small external and internal hemorrhoids, and a few benign-appearing diminutive polyps in the rectum, the distal sigmoid colon, and in the distal descending colon; pathology showed hyperplastic polyps.    Hypertension    Hypothyroidism    Iron  deficiency anemia    Peripheral arterial disease    Spinal stenosis     Patient Active Problem List   Diagnosis Date Noted   Pain due to dental caries 06/15/2023   Dental infection 06/15/2023   Acute respiratory failure with hypoxia (HCC)    COVID-19 virus infection 01/20/2022   Lumbar spondylosis 03/22/2021   Chronic radicular lumbar pain 03/22/2021   Bilateral primary osteoarthritis of knee 03/22/2021   Chronic pain syndrome 03/22/2021   Tobacco use disorder 03/22/2021   Hyponatremia 03/04/2021   Chronic bilateral low back pain without sciatica 12/13/2020   Sleep disturbance  12/13/2020   Vaginitis 10/27/2020   Right ear pain 10/13/2020   Nausea 02/10/2020   B12 deficiency 11/25/2018   Urinary, incontinence, stress female 09/09/2018   Primary osteoarthritis of right knee 05/01/2018   Chronic venous insufficiency 02/26/2017   Candidal skin infection 03/03/2014   Preventative health care 05/14/2013   Spinal stenosis, lumbar region, with neurogenic claudication 03/01/2011   Hypothyroidism 07/06/2006   Diabetes mellitus type 2, uncontrolled 07/06/2006   Hyperlipemia 07/06/2006   Generalized anxiety disorder 07/06/2006   Essential hypertension 07/06/2006   GERD 07/06/2006   Lumbar degenerative disc disease 07/06/2006    Past Surgical History:  Procedure Laterality Date   CARPAL TUNNEL RELEASE  05/08/2000   By Dr. Lamar GAILS. Sypher, Mickey.    OB History   No obstetric history on file.      Home Medications    Prior to Admission medications   Medication Sig Start Date End Date Taking? Authorizing Provider  amitriptyline  (ELAVIL ) 25 MG tablet TAKE 1 TABLET (25 MG TOTAL) BY MOUTH AT BEDTIME. MUST KEEP UPCOMING OFFICE VISIT FOR REFILLS 08/14/24  Yes Delbert Clam, MD  amLODipine  (NORVASC ) 2.5 MG tablet Take 1 tablet (2.5 mg total) by mouth daily. 07/25/24  Yes Fleming, [name] W, NP  cyclobenzaprine  (FLEXERIL ) 5 MG tablet TAKE 1 TABLET BY MOUTH EVERYDAY AT BEDTIME 08/19/24  Yes Newlin, Enobong, MD  enalapril  (  VASOTEC ) 20 MG tablet Take 1 tablet (20 mg total) by mouth daily. 07/23/24  Yes Fleming, [name] W, NP  esomeprazole  (NEXIUM ) 40 MG capsule Take 1 capsule (40 mg total) by mouth daily. 07/23/24  Yes Fleming, [name] W, NP  fluticasone -salmeterol (WIXELA INHUB) 250-50 MCG/ACT AEPB Inhale 1 puff into the lungs 2 (two) times daily. in the morning and at bedtime. 07/23/24  Yes Fleming, [name] W, NP  gabapentin  (NEURONTIN ) 600 MG tablet Take 1 tablet (600 mg total) by mouth 3 (three) times daily. 07/23/24  Yes Theotis Haze ORN, NP  Iron , Ferrous Sulfate , 325 (65 Fe) MG TABS  Take 325 mg by mouth daily. 10/02/23  Yes Danton Slough M, PA-C  levocetirizine (XYZAL ) 5 MG tablet Take 1 tablet (5 mg total) by mouth every evening. 03/23/23  Yes Christopher Savannah, PA-C  levothyroxine  (SYNTHROID ) 88 MCG tablet TAKE 1 TABLET BY MOUTH DAILY BEFORE BREAKFAST. 07/26/24  Yes Fleming, [name] W, NP  meloxicam  (MOBIC ) 7.5 MG tablet Take 1 tablet (7.5 mg total) by mouth daily. 08/19/24  Yes Newlin, Enobong, MD  metFORMIN  (GLUCOPHAGE -XR) 500 MG 24 hr tablet Take 2 tablets (1,000 mg total) by mouth 2 (two) times daily. 07/24/24  Yes Fleming, [name] W, NP  multivitamin-iron -minerals-folic acid  (CENTRUM) chewable tablet Chew 1 tablet by mouth daily. 07/12/20  Yes Jerrell Cleatus Ned, MD  naloxegol  oxalate (MOVANTIK ) 25 MG TABS tablet Take 1 tablet (25 mg total) by mouth daily. 07/23/24  Yes Theotis Haze ORN, NP  rOPINIRole  (REQUIP ) 2 MG tablet TAKE 1 TABLET BY MOUTH EVERYDAY AT BEDTIME 08/19/24  Yes Sikora, Rebecca, DPM  rosuvastatin  (CRESTOR ) 20 MG tablet Take 1 tablet (20 mg total) by mouth daily. 07/23/24  Yes Fleming, [name] W, NP  sulfamethoxazole -trimethoprim  (BACTRIM  DS) 800-160 MG tablet Take 1 tablet by mouth 2 (two) times daily for 10 days. 08/22/24 09/01/24 Yes Andra Krabbe C, PA-C  Tiotropium Bromide Monohydrate  (SPIRIVA  RESPIMAT) 2.5 MCG/ACT AERS Inhale 2 puffs into the lungs daily. 12/30/22  Yes Allwardt, Alyssa M, PA-C  zolpidem  (AMBIEN ) 5 MG tablet TAKE 1 TABLET BY MOUTH EVERYDAY AT BEDTIME 07/26/24  Yes Theotis Haze ORN, NP  Accu-Chek Softclix Lancets lancets Check blood sugar 1 time per day Patient not taking: Reported on 07/23/2024 11/01/23   Newlin, Enobong, MD  albuterol  (PROVENTIL ) (2.5 MG/3ML) 0.083% nebulizer solution USE 1 VIAL IN NEBULIZER 4 TIMES DAILY - As Needed 11/13/23   Fleming, [name] W, NP  albuterol  (VENTOLIN  HFA) 108 (90 Base) MCG/ACT inhaler INHALE 2 PUFFS INTO THE LUNGS EVERY 4 HOURS AS NEEDED FOR WHEEZING OR SHORTNESS OF BREATH. 08/19/24   Newlin, Enobong, MD  Blood  Glucose Monitoring Suppl (ACCU-CHEK GUIDE) w/Device KIT Use to check blood sugar 1 times daily. Patient not taking: Reported on 07/23/2024 11/05/23   Newlin, Enobong, MD  glucose blood (ACCU-CHEK GUIDE TEST) test strip Use to check blood sugar 1 times daily. Patient not taking: Reported on 07/23/2024 11/05/23   Newlin, Enobong, MD  ipratropium-albuterol  (DUONEB) 0.5-2.5 (3) MG/3ML SOLN Take 3 mLs by nebulization every 4 (four) hours as needed. 07/13/23   Theotis Haze ORN, NP  Misc. Devices MISC Provide patient with shower chair and grab bar. ICD10 M17.10 M54.16 03/03/23   Fleming, [name] W, NP  nystatin  powder Apply 1 Application topically 3 (three) times daily. 07/23/24   Fleming, [name] W, NP  ondansetron  (ZOFRAN -ODT) 4 MG disintegrating tablet Take 1 tablet (4 mg total) by mouth every 8 (eight) hours as needed for nausea or vomiting. 07/23/24   Theotis,  [name] W, NP  PREMARIN  vaginal cream PLACE 1 APPLICATORFUL VAGINALLY DAILY. 11/23/21   Jerrell Cleatus Ned, MD  Semaglutide ,0.25 or 0.5MG /DOS, (OZEMPIC , 0.25 OR 0.5 MG/DOSE,) 2 MG/3ML SOPN Inject 0.25 mg into the skin once a week. 08/19/24   Newlin, Enobong, MD  traMADol  (ULTRAM ) 50 MG tablet Take 50 mg by mouth every 6 (six) hours as needed for moderate pain (pain score 4-6). Patient not taking: Reported on 07/23/2024    [provider]  triamcinolone  (KENALOG ) 0.025 % ointment APPLY TO AFFECTED AREA TWICE A DAY Patient not taking: Reported on 07/23/2024 05/22/23   Delbert Clam, MD    Family History Family History  Problem Relation Age of Onset   Heart attack Father 16   Diabetes Mother    Hypertension Mother    Diabetes Sister    Breast cancer Neg Hx    Colon cancer Neg Hx     Social History Social History   Tobacco Use   Smoking status: Some Days    Current packs/day: 0.50    Average packs/day: 0.5 packs/day for 45.0 years (22.5 ttl pk-yrs)    Types: Cigarettes   Smokeless tobacco: Never   Tobacco comments:    1/2 pack per day   Vaping Use   Vaping status: Never Used  Substance Use Topics   Alcohol  use: Yes    Alcohol /week: 1.0 standard drink of alcohol     Types: 1 Cans of beer per week    Comment: Occ   Drug use: No     Allergies   Amoxicillin, Codeine  sulfate, Meperidine and related, Minocycline hcl, Penicillins, Propoxyphene n-acetaminophen , and Tetracycline hcl   Review of Systems Review of Systems   Physical Exam Triage Vital Signs ED Triage Vitals  Encounter Vitals Group     BP 08/22/24 1448 (!) 150/75     Girls Systolic BP Percentile --      Girls Diastolic BP Percentile --      Boys Systolic BP Percentile --      Boys Diastolic BP Percentile --      Pulse Rate 08/22/24 1448 93     Resp 08/22/24 1448 18     Temp 08/22/24 1448 98.4 F (36.9 C)     Temp Source 08/22/24 1448 Oral     SpO2 08/22/24 1448 94 %     Weight --      Height --      Head Circumference --      Peak Flow --      Pain Score 08/22/24 1445 10     Pain Loc --      Pain Education --      Exclude from Growth Chart --    No data found.  Updated Vital Signs BP (!) 150/75 (BP Location: Left Arm)   Pulse 93   Temp 98.4 F (36.9 C) (Oral)   Resp 18   LMP 01/17/2009   SpO2 94%   Visual Acuity Right Eye Distance:   Left Eye Distance:   Bilateral Distance:    Right Eye Near:   Left Eye Near:    Bilateral Near:     Physical Exam Vitals and nursing note reviewed.  Constitutional:      General: She is not in acute distress.    Appearance: Normal appearance. She is not ill-appearing, toxic-appearing or diaphoretic.  HENT:     Head:   Eyes:     General: No scleral icterus. Cardiovascular:     Rate and Rhythm: Normal rate  and regular rhythm.     Heart sounds: Normal heart sounds.  Pulmonary:     Effort: Pulmonary effort is normal. No respiratory distress.     Breath sounds: Normal breath sounds. No wheezing or rhonchi.  Skin:    General: Skin is warm.     Findings: Lesion present.     Comments:  Erythematous cyst that is tender to palpation noted of right side of occiput  Neurological:     Mental Status: She is alert and oriented to person, place, and time.  Psychiatric:        Mood and Affect: Mood normal.        Behavior: Behavior normal.      UC Treatments / Results  Labs (all labs ordered are listed, but only abnormal results are displayed) Labs Reviewed - No data to display  EKG   Radiology No results found.  Procedures Procedures (including critical care time)  Medications Ordered in UC Medications  ketorolac  (TORADOL ) 30 MG/ML injection 30 mg (30 mg Intramuscular Given 08/22/24 1507)    Initial Impression / Assessment and Plan / UC Course  I have reviewed the triage vital signs and the nursing notes.  Pertinent labs & imaging results that were available during my care of the patient were reviewed by me and considered in my medical decision making (see chart for details).     Dermoid cyst-patient was given Toradol  in office for pain today. Pt was prescribed Bactrim  DS and was referred to her dermatologist Final Clinical Impressions(s) / UC Diagnoses   Final diagnoses:  Cyst of skin and subcutaneous tissue     Discharge Instructions      Concerned that you have an infected cyst of your scalp.  Will prescribe antibiotic treatment and advise that you follow-up with your dermatologist.  Put in a referral for you to go to your previous dermatologist.    ED Prescriptions     Medication Sig Dispense Auth. Provider   sulfamethoxazole -trimethoprim  (BACTRIM  DS) 800-160 MG tablet Take 1 tablet by mouth 2 (two) times daily for 10 days. 20 tablet Andra Corean BROCKS, PA-C      PDMP not reviewed this encounter.   Andra Corean BROCKS, PA-C 08/22/24 1519

## 2024-08-22 NOTE — ED Triage Notes (Signed)
 Pt states she has a knot on the back right side of her head X 3 months but its now hurting when she moves or lays down on the area. She takes tylenol  without relief.

## 2024-08-22 NOTE — Discharge Instructions (Addendum)
 Concerned that you have an infected cyst of your scalp.  Will prescribe antibiotic treatment and advise that you follow-up with your dermatologist.  Put in a referral for you to go to your previous dermatologist.

## 2024-09-16 ENCOUNTER — Other Ambulatory Visit: Payer: Self-pay | Admitting: Family Medicine

## 2024-09-16 DIAGNOSIS — M51362 Other intervertebral disc degeneration, lumbar region with discogenic back pain and lower extremity pain: Secondary | ICD-10-CM

## 2024-09-16 DIAGNOSIS — G894 Chronic pain syndrome: Secondary | ICD-10-CM

## 2024-10-14 ENCOUNTER — Ambulatory Visit: Payer: Self-pay | Attending: Nurse Practitioner

## 2024-10-14 ENCOUNTER — Telehealth: Payer: Self-pay

## 2024-10-14 VITALS — Ht 61.0 in | Wt 190.0 lb

## 2024-10-14 DIAGNOSIS — Z0000 Encounter for general adult medical examination without abnormal findings: Secondary | ICD-10-CM

## 2024-10-14 NOTE — Telephone Encounter (Signed)
 Patient stated during AWV that she has been without her medication for the last 3 weeks.  Her medication bag was accidentally thrown away by the landlords.  She stated that she is diabetic and have other issues that is going on with her significant other with cancer and she forgot to call to requests refills.  Patient stated that she is having nausea and just not feeling herself.  Please contact patient to follow up regarding the refill request.  Roz SAILOR. Tomie, LPN [employer]

## 2024-10-14 NOTE — Patient Instructions (Signed)
 Mary Gray,  Thank you for taking the time for your Medicare Wellness Visit. I appreciate your continued commitment to your health goals. Please review the care plan we discussed, and feel free to reach out if I can assist you further.  Please note that Annual Wellness Visits do not include a physical exam. Some assessments may be limited, especially if the visit was conducted virtually. If needed, we may recommend an in-person follow-up with your provider.  Ongoing Care Seeing your primary care provider every 3 to 6 months helps us  monitor your health and provide consistent, personalized care.   Referrals If a referral was made during today's visit and you haven't received any updates within two weeks, please contact the referred provider directly to check on the status.  Recommended Screenings:  Health Maintenance  Topic Date Due   COVID-19 Vaccine (1) Never done   Breast Cancer Screening  04/23/2015   Pap with HPV screening  11/17/2017   Colon Cancer Screening  04/08/2020   Complete foot exam   07/12/2021   Eye exam for diabetics  11/04/2021   Medicare Annual Wellness Visit  01/17/2024   Screening for Lung Cancer  06/07/2024   Yearly kidney function blood test for diabetes  09/18/2024   Zoster (Shingles) Vaccine (1 of 2) 10/22/2024*   Flu Shot  02/17/2025*   Hemoglobin A1C  10/22/2024   Yearly kidney health urinalysis for diabetes  07/23/2025   Pneumococcal Vaccine for age over 35  Completed   Hepatitis C Screening  Completed   HIV Screening  Completed   Hepatitis B Vaccine  Aged Out   HPV Vaccine  Aged Out   Meningitis B Vaccine  Aged Out   DTaP/Tdap/Td vaccine  Discontinued  *Topic was postponed. The date shown is not the original due date.       10/14/2024    4:53 PM  Advanced Directives  Does Patient Have a Medical Advance Directive? No  Would patient like information on creating a medical advance directive? No - Patient declined    Vision: Annual vision  screenings are recommended for early detection of glaucoma, cataracts, and diabetic retinopathy. These exams can also reveal signs of chronic conditions such as diabetes and high blood pressure.  Dental: Annual dental screenings help detect early signs of oral cancer, gum disease, and other conditions linked to overall health, including heart disease and diabetes.  Please see the attached documents for additional preventive care recommendations.

## 2024-10-14 NOTE — Progress Notes (Signed)
 I connected with  Mary Gray on 10/14/24 by a audio enabled telemedicine application and verified that I am speaking with the correct person using two identifiers.  Patient Location: Home  Provider Location: Office/Clinic  Persons Participating in Visit: Patient.  I discussed the limitations of evaluation and management by telemedicine. The patient expressed understanding and agreed to proceed.   Vital Signs: Because this visit was a virtual/telehealth visit, some criteria may be missing or patient reported. Any vitals not documented were not able to be obtained and vitals that have been documented are patient reported.     Chief Complaint  Patient presents with   Medicare Wellness    SUBSEQUENT     Subjective:   Mary Gray is a 63 y.o. female who presents for a Medicare Annual Wellness Visit.  Allergies (verified) Amoxicillin, Codeine  sulfate, Meperidine and related, Minocycline hcl, Penicillins, Propoxyphene n-acetaminophen , and Tetracycline hcl   History: Past Medical History:  Diagnosis Date   Asthma    Back pain    Chronic interstitial cystitis    Followed by Dr. Garnette Shack   COPD (chronic obstructive pulmonary disease) (HCC)    DDD (degenerative disc disease), lumbar    Degeneration of cervical intervertebral disc    Depression    Diabetes mellitus, type 2 (HCC) 2007   Heme positive stool    EGD on 04/08/2010 by Dr. Rosalie showed chronic gastritis and a few gastric polyps; pathology showed a fundic gland polyp.  Colonoscopy on 04/08/2010 showed small external and internal hemorrhoids, and a few benign-appearing diminutive polyps in the rectum, the distal sigmoid colon, and in the distal descending colon; pathology showed hyperplastic polyps.    Hypertension    Hypothyroidism    Iron  deficiency anemia    Peripheral arterial disease    Spinal stenosis    Past Surgical History:  Procedure Laterality Date   CARPAL TUNNEL RELEASE  05/08/2000   By Dr.  Lamar GAILS. Sypher, Mickey.   Family History  Problem Relation Age of Onset   Heart attack Father 44   Diabetes Mother    Hypertension Mother    Diabetes Sister    Breast cancer Neg Hx    Colon cancer Neg Hx    Social History   Occupational History   Not on file  Tobacco Use   Smoking status: Some Days    Current packs/day: 0.50    Average packs/day: 0.5 packs/day for 45.0 years (22.5 ttl pk-yrs)    Types: Cigarettes   Smokeless tobacco: Never   Tobacco comments:    1/2 pack per day  Vaping Use   Vaping status: Never Used  Substance and Sexual Activity   Alcohol  use: Yes    Alcohol /week: 1.0 standard drink of alcohol     Types: 1 Cans of beer per week    Comment: Occ   Drug use: No   Sexual activity: Not Currently   Tobacco Counseling Ready to quit: Not Answered Counseling given: Not Answered Tobacco comments: 1/2 pack per day  SDOH Screenings   Food Insecurity: No Food Insecurity (10/14/2024)  Housing: Low Risk  (10/14/2024)  Transportation Needs: Unmet Transportation Needs (10/14/2024)  Utilities: Not At Risk (10/14/2024)  Alcohol  Screen: Low Risk  (01/16/2023)  Depression (PHQ2-9): High Risk (10/14/2024)  Financial Resource Strain: Low Risk  (10/14/2024)  Physical Activity: Inactive (10/14/2024)  Social Connections: Socially Isolated (10/14/2024)  Stress: Stress Concern Present (10/14/2024)  Tobacco Use: High Risk (10/14/2024)  Health Literacy: Adequate Health Literacy (10/14/2024)  See flowsheets for full screening details  Depression Screen PHQ 2 & 9 Depression Scale- Over the past 2 weeks, how often have you been bothered by any of the following problems? Little interest or pleasure in doing things: 0 Feeling down, depressed, or hopeless (PHQ Adolescent also includes...irritable): 3 PHQ-2 Total Score: 3 Trouble falling or staying asleep, or sleeping too much: 3 Feeling tired or having little energy: 3 Poor appetite or overeating (PHQ Adolescent also  includes...weight loss): 3 Feeling bad about yourself - or that you are a failure or have let yourself or your family down: 0 Trouble concentrating on things, such as reading the newspaper or watching television (PHQ Adolescent also includes...like school work): 1 Moving or speaking so slowly that other people could have noticed. Or the opposite - being so fidgety or restless that you have been moving around a lot more than usual: 0 Thoughts that you would be better off dead, or of hurting yourself in some way: 0 PHQ-9 Total Score: 13 If you checked off any problems, how difficult have these problems made it for you to do your work, take care of things at home, or get along with other people?: Somewhat difficult     Goals Addressed             This Visit's Progress    10/14/2024: To get back to my old self.         Visit info / Clinical Intake: Medicare Wellness Visit Type:: Subsequent Annual Wellness Visit Persons participating in visit:: patient Medicare Wellness Visit Mode:: Telephone If telephone:: video declined Because this visit was a virtual/telehealth visit:: pt reported vitals If Telephone or Video please confirm:: I connected with the patient using audio enabled telemedicine application and verified that I am speaking with the correct person using two identifiers; I discussed the limitations of evaluation and management by telemedicine; The patient expressed understanding and agreed to proceed Patient Location:: HOME Provider Location:: OFFICE Information given by:: patient Interpreter Needed?: No Pre-visit prep was completed: yes AWV questionnaire completed by patient prior to visit?: no Living arrangements:: lives with spouse/significant other Patient's Overall Health Status Rating: (!) poor Typical amount of pain: (!) a lot Does pain affect daily life?: (!) yes Are you currently prescribed opioids?: no  Dietary Habits and Nutritional Risks How many meals a day?:  (!) 1 Eats fruit and vegetables daily?: yes (TRY TO, BUT NOT ENOUGH) Most meals are obtained by: preparing own meals (MICROWAVE MEALS) In the last 2 weeks, have you had any of the following?: (!) nausea, vomiting, diarrhea Diabetic:: (!) yes Any non-healing wounds?: no How often do you check your BS?: 1 Would you like to be referred to a Nutritionist or for Diabetic Management? : no  Functional Status Activities of Daily Living (to include ambulation/medication): Independent Ambulation: Independent with device- listed below Home Assistive Devices/Equipment: Cane Medication Administration: Independent Home Management: Independent Manage your own finances?: yes Primary transportation is: facility / other (INSURANCE PROVIDES TRANSPORATION) Concerns about vision?: no *vision screening is required for WTM* (SMALL PRINT VERY HARD TO READ) Concerns about hearing?: (!) yes Uses hearing aids?: no Hear whispered voice?: (!) no *in-person visit only*  Fall Screening Falls in the past year?: 1 Number of falls in past year: 0 Was there an injury with Fall?: 0 Fall Risk Category Calculator: 1 Patient Fall Risk Level: Low Fall Risk  Fall Risk Patient at Risk for Falls Due to: Impaired balance/gait Fall risk Follow up: Falls evaluation completed; Education  provided  Home and Transportation Safety: All rugs have non-skid backing?: N/A, no rugs All stairs or steps have railings?: N/A, no stairs Grab bars in the bathtub or shower?: (!) no Have non-skid surface in bathtub or shower?: (!) no Good home lighting?: yes Regular seat belt use?: yes Hospital stays in the last year:: no  Cognitive Assessment Difficulty concentrating, remembering, or making decisions? : yes Will 6CIT or Mini Cog be Completed: no 6CIT or Mini Cog Declined: patient alert, oriented, able to answer questions appropriately and recall recent events  Advance Directives (For Healthcare) Does Patient Have a Medical Advance  Directive?: No Would patient like information on creating a medical advance directive?: No - Patient declined  Reviewed/Updated  Reviewed/Updated: Reviewed All (Medical, Surgical, Family, Medications, Allergies, Care Teams, Patient Goals)        Objective:    Today's Vitals   10/14/24 1648  Weight: 190 lb (86.2 kg)  Height: 5' 1 (1.549 m)  PainSc: 9   PainLoc: Head   Body mass index is 35.9 kg/m.  Current Medications (verified) Outpatient Encounter Medications as of 10/14/2024  Medication Sig   Accu-Chek Softclix Lancets lancets Check blood sugar 1 time per day (Patient not taking: Reported on 07/23/2024)   albuterol  (PROVENTIL ) (2.5 MG/3ML) 0.083% nebulizer solution USE 1 VIAL IN NEBULIZER 4 TIMES DAILY - As Needed   albuterol  (VENTOLIN  HFA) 108 (90 Base) MCG/ACT inhaler INHALE 2 PUFFS INTO THE LUNGS EVERY 4 HOURS AS NEEDED FOR WHEEZING OR SHORTNESS OF BREATH.   amitriptyline  (ELAVIL ) 25 MG tablet TAKE 1 TABLET (25 MG TOTAL) BY MOUTH AT BEDTIME. MUST KEEP UPCOMING OFFICE VISIT FOR REFILLS   amLODipine  (NORVASC ) 2.5 MG tablet Take 1 tablet (2.5 mg total) by mouth daily.   Blood Glucose Monitoring Suppl (ACCU-CHEK GUIDE) w/Device KIT Use to check blood sugar 1 times daily. (Patient not taking: Reported on 07/23/2024)   cyclobenzaprine  (FLEXERIL ) 5 MG tablet TAKE 1 TABLET BY MOUTH EVERYDAY AT BEDTIME   enalapril  (VASOTEC ) 20 MG tablet Take 1 tablet (20 mg total) by mouth daily.   esomeprazole  (NEXIUM ) 40 MG capsule Take 1 capsule (40 mg total) by mouth daily.   fluticasone -salmeterol (WIXELA INHUB) 250-50 MCG/ACT AEPB Inhale 1 puff into the lungs 2 (two) times daily. in the morning and at bedtime.   gabapentin  (NEURONTIN ) 600 MG tablet Take 1 tablet (600 mg total) by mouth 3 (three) times daily.   glucose blood (ACCU-CHEK GUIDE TEST) test strip Use to check blood sugar 1 times daily. (Patient not taking: Reported on 07/23/2024)   ipratropium-albuterol  (DUONEB) 0.5-2.5 (3) MG/3ML SOLN  Take 3 mLs by nebulization every 4 (four) hours as needed.   Iron , Ferrous Sulfate , 325 (65 Fe) MG TABS Take 325 mg by mouth daily.   levocetirizine (XYZAL ) 5 MG tablet Take 1 tablet (5 mg total) by mouth every evening.   levothyroxine  (SYNTHROID ) 88 MCG tablet TAKE 1 TABLET BY MOUTH DAILY BEFORE BREAKFAST.   meloxicam  (MOBIC ) 7.5 MG tablet Take 1 tablet (7.5 mg total) by mouth daily.   metFORMIN  (GLUCOPHAGE -XR) 500 MG 24 hr tablet Take 2 tablets (1,000 mg total) by mouth 2 (two) times daily.   Misc. Devices MISC Provide patient with shower chair and grab bar. ICD10 M17.10 M54.16   multivitamin-iron -minerals-folic acid  (CENTRUM) chewable tablet Chew 1 tablet by mouth daily.   naloxegol  oxalate (MOVANTIK ) 25 MG TABS tablet Take 1 tablet (25 mg total) by mouth daily.   nystatin  powder Apply 1 Application topically 3 (three) times daily.  ondansetron  (ZOFRAN -ODT) 4 MG disintegrating tablet Take 1 tablet (4 mg total) by mouth every 8 (eight) hours as needed for nausea or vomiting.   PREMARIN  vaginal cream PLACE 1 APPLICATORFUL VAGINALLY DAILY.   rOPINIRole  (REQUIP ) 2 MG tablet TAKE 1 TABLET BY MOUTH EVERYDAY AT BEDTIME   rosuvastatin  (CRESTOR ) 20 MG tablet Take 1 tablet (20 mg total) by mouth daily.   Semaglutide ,0.25 or 0.5MG /DOS, (OZEMPIC , 0.25 OR 0.5 MG/DOSE,) 2 MG/3ML SOPN Inject 0.25 mg into the skin once a week.   Tiotropium Bromide Monohydrate  (SPIRIVA  RESPIMAT) 2.5 MCG/ACT AERS Inhale 2 puffs into the lungs daily.   traMADol  (ULTRAM ) 50 MG tablet Take 50 mg by mouth every 6 (six) hours as needed for moderate pain (pain score 4-6). (Patient not taking: Reported on 07/23/2024)   triamcinolone  (KENALOG ) 0.025 % ointment APPLY TO AFFECTED AREA TWICE A DAY (Patient not taking: Reported on 07/23/2024)   zolpidem  (AMBIEN ) 5 MG tablet TAKE 1 TABLET BY MOUTH EVERYDAY AT BEDTIME   No facility-administered encounter medications on file as of 10/14/2024.   Hearing/Vision screen Hearing Screening -  Comments:: Some hearing difficulties, no hearing aids.  Vision Screening - Comments:: No rx glasses - not up to date with routine eye exams.  Immunizations and Health Maintenance Health Maintenance  Topic Date Due   COVID-19 Vaccine (1) Never done   Mammogram  04/23/2015   Cervical Cancer Screening (HPV/Pap Cotest)  11/17/2017   Colonoscopy  04/08/2020   FOOT EXAM  07/12/2021   OPHTHALMOLOGY EXAM  11/04/2021   Lung Cancer Screening  06/07/2024   Diabetic kidney evaluation - eGFR measurement  09/18/2024   Zoster Vaccines- Shingrix (1 of 2) 10/22/2024 (Originally 01/18/1980)   Influenza Vaccine  02/17/2025 (Originally 06/20/2024)   HEMOGLOBIN A1C  10/22/2024   Diabetic kidney evaluation - Urine ACR  07/23/2025   Medicare Annual Wellness (AWV)  10/14/2025   Pneumococcal Vaccine: 50+ Years  Completed   Hepatitis C Screening  Completed   HIV Screening  Completed   Hepatitis B Vaccines 19-59 Average Risk  Aged Out   HPV VACCINES  Aged Out   Meningococcal B Vaccine  Aged Out   DTaP/Tdap/Td  Discontinued        Assessment/Plan:  This is a routine wellness examination for Mary Gray.  Patient Care Team: Theotis Haze ORN, NP as PCP - General (Nurse Practitioner) Octavia Charleston, MD as Consulting Physician (Ophthalmology) Paul Barrio, OD as Referring Physician (Optometry)  I have personally reviewed and noted the following in the patient's chart:   Medical and social history Use of alcohol , tobacco or illicit drugs  Current medications and supplements including opioid prescriptions. Functional ability and status Nutritional status Physical activity Advanced directives List of other physicians Hospitalizations, surgeries, and ER visits in previous 12 months Vitals Screenings to include cognitive, depression, and falls Referrals and appointments  No orders of the defined types were placed in this encounter.  In addition, I have reviewed and discussed with patient certain  preventive protocols, quality metrics, and best practice recommendations. A written personalized care plan for preventive services as well as general preventive health recommendations were provided to patient.   Roz LOISE Fuller, LPN   88/[id number]/7974   Return in about 1 year (around 10/14/2025) for Medicare wellness.  After Visit Summary: (MyChart) Due to this being a telephonic visit, the after visit summary with patients personalized plan was offered to patient via MyChart   Nurse Notes: Please see routing comments.

## 2024-10-15 ENCOUNTER — Telehealth: Payer: Self-pay | Admitting: Nurse Practitioner

## 2024-10-15 ENCOUNTER — Other Ambulatory Visit: Payer: Self-pay | Admitting: Nurse Practitioner

## 2024-10-15 ENCOUNTER — Other Ambulatory Visit: Payer: Self-pay

## 2024-10-15 DIAGNOSIS — F418 Other specified anxiety disorders: Secondary | ICD-10-CM

## 2024-10-15 NOTE — Telephone Encounter (Signed)
Unable to reach patient by phone. Unable to leave voicemail.

## 2024-10-15 NOTE — Telephone Encounter (Signed)
 Patient returning call. Patient states they had some of the refills but did not have the BP pill and they did not have the prescription that helps her use the bathroom.

## 2024-10-15 NOTE — Telephone Encounter (Signed)
 Copied from CRM #[id number]. Topic: Clinical - Medical Advice >> Oct 15, 2024  1:07 PM Dedra B wrote: Reason for CRM: Pt returning call for [place].

## 2024-10-15 NOTE — Telephone Encounter (Signed)
 Patient identified by name and date of birth.  Patient aware of medications request being  to soon for refill and out of pocket expensive if she wants it sooner.

## 2024-10-15 NOTE — Telephone Encounter (Signed)
 Patient identified by name and date of birth.  Patient advised to call pharmacy to see of refills can be filled with an override to the system.  Or if she can just have refills on her requested medication.

## 2024-11-14 ENCOUNTER — Other Ambulatory Visit: Payer: Self-pay | Admitting: Family Medicine

## 2024-11-14 DIAGNOSIS — M51369 Other intervertebral disc degeneration, lumbar region without mention of lumbar back pain or lower extremity pain: Secondary | ICD-10-CM

## 2024-12-03 ENCOUNTER — Other Ambulatory Visit: Payer: Self-pay | Admitting: Family Medicine

## 2024-12-17 ENCOUNTER — Other Ambulatory Visit: Payer: Self-pay | Admitting: Nurse Practitioner

## 2024-12-17 DIAGNOSIS — F418 Other specified anxiety disorders: Secondary | ICD-10-CM

## 2024-12-17 NOTE — Telephone Encounter (Unsigned)
 Copied from CRM [id number]. Topic: Clinical - Medication Refill >> Dec 17, 2024  5:27 PM Hadassah PARAS wrote: Medication: zolpidem  (AMBIEN ) 5 MG tablet   Has the patient contacted their pharmacy? Yes (Agent: If no, request that the patient contact the pharmacy for the refill. If patient does not wish to contact the pharmacy document the reason why and proceed with request.) (Agent: If yes, when and what did the pharmacy advise?)  This is the patient's preferred pharmacy:  [id number] [place], [place] - [address] [place] [address] Phone: [phone] Fax: [phone]   Is this the correct pharmacy for this prescription? Yes If no, delete pharmacy and type the correct one.   Has the prescription been filled recently? Yes  Is the patient out of the medication? Yes  Has the patient been seen for an appointment in the last year OR does the patient have an upcoming appointment? Yes  Can we respond through MyChart? Yes  Agent: Please be advised that Rx refills may take up to 3 business days. We ask that you follow-up with your pharmacy.

## 2024-12-18 MED ORDER — ZOLPIDEM TARTRATE 5 MG PO TABS: 5.0000 mg | ORAL_TABLET | Freq: Every day | ORAL | 1 refills | Status: AC

## 2024-12-18 NOTE — Telephone Encounter (Signed)
 Requested medication (s) are due for refill today: yes  Requested medication (s) are on the active medication list: yes  Last refill:  10/17/24  Future visit scheduled: {no  Notes to clinic:  Unable to refill per protocol, cannot delegate.      Requested Prescriptions  Pending Prescriptions Disp Refills   zolpidem  (AMBIEN ) 5 MG tablet 30 tablet 1     Not Delegated - Psychiatry:  Anxiolytics/Hypnotics Failed - 12/18/2024  3:31 PM      Failed - This refill cannot be delegated      Failed - Urine Drug Screen completed in last 360 days      Passed - Valid encounter within last 6 months    Recent Outpatient Visits           4 months ago Primary hypertension   [facility] [place]. [facility] Theotis Haze ORN, NP   1 year ago Other fatigue   [facility] [place]. [facility] [place], [place], [place]   1 year ago Type 2 diabetes mellitus with hyperglycemia, unspecified whether long term insulin  use [facility])   [facility] [place]. [facility] [place], [place], [place]   1 year ago Uncontrolled type 2 diabetes mellitus with hyperglycemia [facility])   [facility] [place]. [facility] [place], [place], [place]   1 year ago Uncontrolled type 2 diabetes mellitus with hyperglycemia [facility])   [facility] Of [place]. [facility] Theotis Haze ORN, [place]

## 2024-12-18 NOTE — Telephone Encounter (Signed)
Pt called about refill.
